# Patient Record
Sex: Female | Born: 1960
Health system: Southern US, Community
[De-identification: ages and names within clinical notes are randomized; demographics above are authoritative.]

## PROBLEM LIST (undated history)

## (undated) DIAGNOSIS — I712 Thoracic aortic aneurysm, without rupture: Secondary | ICD-10-CM

## (undated) DIAGNOSIS — F419 Anxiety disorder, unspecified: Secondary | ICD-10-CM

## (undated) DIAGNOSIS — R931 Abnormal findings on diagnostic imaging of heart and coronary circulation: Secondary | ICD-10-CM

## (undated) DIAGNOSIS — I779 Disorder of arteries and arterioles, unspecified: Secondary | ICD-10-CM

## (undated) DIAGNOSIS — S92309A Fracture of unspecified metatarsal bone(s), unspecified foot, initial encounter for closed fracture: Secondary | ICD-10-CM

## (undated) DIAGNOSIS — Z973 Presence of spectacles and contact lenses: Secondary | ICD-10-CM

## (undated) DIAGNOSIS — I773 Arterial fibromuscular dysplasia: Secondary | ICD-10-CM

## (undated) DIAGNOSIS — I7121 Aneurysm of the ascending aorta, without rupture: Secondary | ICD-10-CM

## (undated) DIAGNOSIS — N281 Cyst of kidney, acquired: Secondary | ICD-10-CM

## (undated) DIAGNOSIS — Q2381 Bicuspid aortic valve: Secondary | ICD-10-CM

## (undated) DIAGNOSIS — I10 Essential (primary) hypertension: Secondary | ICD-10-CM

## (undated) DIAGNOSIS — Q231 Congenital insufficiency of aortic valve: Secondary | ICD-10-CM

## (undated) DIAGNOSIS — N951 Menopausal and female climacteric states: Secondary | ICD-10-CM

## (undated) DIAGNOSIS — E119 Type 2 diabetes mellitus without complications: Secondary | ICD-10-CM

## (undated) DIAGNOSIS — I350 Nonrheumatic aortic (valve) stenosis: Secondary | ICD-10-CM

## (undated) DIAGNOSIS — Z8719 Personal history of other diseases of the digestive system: Secondary | ICD-10-CM

## (undated) DIAGNOSIS — E042 Nontoxic multinodular goiter: Secondary | ICD-10-CM

## (undated) DIAGNOSIS — I359 Nonrheumatic aortic valve disorder, unspecified: Secondary | ICD-10-CM

## (undated) DIAGNOSIS — R011 Cardiac murmur, unspecified: Secondary | ICD-10-CM

## (undated) DIAGNOSIS — M542 Cervicalgia: Secondary | ICD-10-CM

## (undated) DIAGNOSIS — I701 Atherosclerosis of renal artery: Secondary | ICD-10-CM

## (undated) DIAGNOSIS — R198 Other specified symptoms and signs involving the digestive system and abdomen: Secondary | ICD-10-CM

## (undated) DIAGNOSIS — Z1239 Encounter for other screening for malignant neoplasm of breast: Secondary | ICD-10-CM

## (undated) DIAGNOSIS — M1990 Unspecified osteoarthritis, unspecified site: Secondary | ICD-10-CM

## (undated) DIAGNOSIS — H3560 Retinal hemorrhage, unspecified eye: Secondary | ICD-10-CM

## (undated) HISTORY — DX: Fracture of unspecified metatarsal bone(s), unspecified foot, initial encounter for closed fracture: S92.309A

## (undated) HISTORY — DX: Other specified symptoms and signs involving the digestive system and abdomen: R19.8

## (undated) HISTORY — DX: Congenital insufficiency of aortic valve: Q23.1

## (undated) HISTORY — PX: OTHER SURGICAL HISTORY: SHX169

## (undated) HISTORY — DX: Disorder of arteries and arterioles, unspecified: I77.9

## (undated) HISTORY — DX: Nonrheumatic aortic (valve) stenosis: I35.0

## (undated) HISTORY — PX: APPENDECTOMY: SHX54

## (undated) HISTORY — PX: OSTEOTOMY MAXILLARY: SUR982

## (undated) HISTORY — DX: Essential (primary) hypertension: I10

## (undated) HISTORY — PX: OOPHORECTOMY: SHX86

## (undated) HISTORY — DX: Abnormal findings on diagnostic imaging of heart and coronary circulation: R93.1

## (undated) HISTORY — DX: Cardiac murmur, unspecified: R01.1

## (undated) HISTORY — PX: TUBAL LIGATION: SHX77

## (undated) HISTORY — DX: Nonrheumatic aortic valve disorder, unspecified: I35.9

## (undated) HISTORY — DX: Cervicalgia: M54.2

## (undated) HISTORY — DX: Encounter for other screening for malignant neoplasm of breast: Z12.39

## (undated) HISTORY — DX: Retinal hemorrhage, unspecified eye: H35.60

## (undated) HISTORY — DX: Cyst of kidney, acquired: N28.1

## (undated) HISTORY — DX: Bicuspid aortic valve: Q23.81

## (undated) HISTORY — DX: Menopausal and female climacteric states: N95.1

## (undated) HISTORY — PX: ABDOMINAL HYSTERECTOMY: SHX81

---

## 2001-08-24 LAB — HM COLONOSCOPY: HM Colonoscopy: NORMAL

## 2004-02-20 ENCOUNTER — Ambulatory Visit: Payer: Self-pay | Admitting: Physician Assistant

## 2004-06-27 ENCOUNTER — Ambulatory Visit: Payer: Self-pay | Admitting: Physician Assistant

## 2004-10-01 ENCOUNTER — Ambulatory Visit: Payer: Self-pay | Admitting: Physician Assistant

## 2005-01-31 ENCOUNTER — Ambulatory Visit: Payer: Self-pay | Admitting: Physician Assistant

## 2005-05-13 DIAGNOSIS — S92309A Fracture of unspecified metatarsal bone(s), unspecified foot, initial encounter for closed fracture: Secondary | ICD-10-CM

## 2005-05-13 HISTORY — DX: Fracture of unspecified metatarsal bone(s), unspecified foot, initial encounter for closed fracture: S92.309A

## 2005-05-30 ENCOUNTER — Ambulatory Visit: Payer: Self-pay | Admitting: Physician Assistant

## 2005-10-02 ENCOUNTER — Ambulatory Visit: Payer: Self-pay | Admitting: Physician Assistant

## 2006-01-30 ENCOUNTER — Ambulatory Visit: Payer: Self-pay | Admitting: Physician Assistant

## 2006-03-25 LAB — HM PAP SMEAR

## 2006-06-02 ENCOUNTER — Ambulatory Visit: Payer: Self-pay | Admitting: Physician Assistant

## 2006-10-09 ENCOUNTER — Ambulatory Visit: Payer: Self-pay | Admitting: Physician Assistant

## 2007-01-09 ENCOUNTER — Ambulatory Visit: Payer: Self-pay | Admitting: Physician Assistant

## 2007-03-06 ENCOUNTER — Ambulatory Visit: Payer: Self-pay | Admitting: Internal Medicine

## 2007-04-30 ENCOUNTER — Ambulatory Visit: Payer: Self-pay | Admitting: Physician Assistant

## 2007-08-12 ENCOUNTER — Ambulatory Visit: Payer: Self-pay | Admitting: Physician Assistant

## 2007-11-11 ENCOUNTER — Ambulatory Visit: Payer: Self-pay | Admitting: Physician Assistant

## 2007-12-01 ENCOUNTER — Ambulatory Visit: Payer: Self-pay

## 2008-03-10 ENCOUNTER — Ambulatory Visit: Payer: Self-pay | Admitting: Physician Assistant

## 2008-06-09 ENCOUNTER — Ambulatory Visit: Payer: Self-pay | Admitting: Physician Assistant

## 2008-09-20 ENCOUNTER — Ambulatory Visit: Payer: Self-pay | Admitting: Physician Assistant

## 2008-12-21 ENCOUNTER — Ambulatory Visit: Payer: Self-pay | Admitting: Physician Assistant

## 2009-04-13 ENCOUNTER — Ambulatory Visit: Payer: Self-pay | Admitting: Physician Assistant

## 2009-05-10 ENCOUNTER — Ambulatory Visit: Payer: Self-pay | Admitting: Physician Assistant

## 2009-06-08 ENCOUNTER — Ambulatory Visit: Payer: Self-pay | Admitting: Physician Assistant

## 2009-07-17 ENCOUNTER — Ambulatory Visit: Payer: Self-pay | Admitting: Pain Medicine

## 2009-08-14 ENCOUNTER — Ambulatory Visit: Payer: Self-pay | Admitting: Pain Medicine

## 2010-01-02 ENCOUNTER — Ambulatory Visit: Payer: Self-pay | Admitting: General Practice

## 2010-02-23 ENCOUNTER — Ambulatory Visit: Payer: Self-pay | Admitting: Internal Medicine

## 2010-09-25 ENCOUNTER — Ambulatory Visit: Payer: Self-pay | Admitting: Internal Medicine

## 2011-02-23 LAB — HM COLONOSCOPY: HM Colonoscopy: NORMAL

## 2011-02-28 ENCOUNTER — Telehealth: Payer: Self-pay | Admitting: Internal Medicine

## 2011-02-28 NOTE — Telephone Encounter (Signed)
Will fill med when fax is received.

## 2011-02-28 NOTE — Telephone Encounter (Signed)
Patient has gone by [employer] to sign a medical release form. Patient needs refill, there will be a fax request from the hospital pharmacy

## 2011-04-10 ENCOUNTER — Other Ambulatory Visit: Payer: Self-pay

## 2011-06-04 ENCOUNTER — Other Ambulatory Visit: Payer: Self-pay | Admitting: *Deleted

## 2011-06-04 NOTE — Telephone Encounter (Signed)
Faxed request from [facility], last filled 05/03/11.

## 2011-06-05 NOTE — Telephone Encounter (Signed)
LMOM for patient to call 

## 2011-06-05 NOTE — Telephone Encounter (Signed)
Pleas call Courtney Lopez   She has chronic back pain but has not been seen in new office yet and #240 tablets is a 2 month supply at 4 daily  So if she refilled in on 12/21 she is early .  Cannot take more than 4 daily bc of the tylenol content.

## 2011-06-06 NOTE — Telephone Encounter (Signed)
Advised pt, she says the current bottle she has says to take 2 tablets four times a day.  She says she doesn't take that many every day and never takes above that amount.  She says she has enough to last until mid next week.

## 2011-06-07 MED ORDER — HYDROCODONE-ACETAMINOPHEN 5-500 MG PO TABS: 1.0000 | ORAL_TABLET | Freq: Four times a day (QID) | ORAL | Status: AC | PRN

## 2011-06-07 NOTE — Telephone Encounter (Signed)
i cannot refilkl it like that  bc the maxium she should have is 4 daily because of the tylenol.  I will refill for 120/month

## 2011-06-07 NOTE — Telephone Encounter (Signed)
Medicine called to pharmacy. 

## 2011-06-13 ENCOUNTER — Telehealth: Payer: Self-pay | Admitting: Internal Medicine

## 2011-06-13 NOTE — Telephone Encounter (Signed)
Saw her in the hospital Saturday night, she did not say she needed anything else for thr time being,

## 2011-06-13 NOTE — Telephone Encounter (Signed)
Offered pt appt today because there is a cancellation but pt says she is out of town and unable to come in.  She has appt on 2/5, says she will come in then.

## 2011-06-13 NOTE — Telephone Encounter (Signed)
Left message on machine asking pt to call back. 

## 2011-06-13 NOTE — Telephone Encounter (Signed)
Patient is having neck issues needing a prescription for it wants to be seen Friday or Monday.

## 2011-06-18 ENCOUNTER — Ambulatory Visit (INDEPENDENT_AMBULATORY_CARE_PROVIDER_SITE_OTHER): Payer: PRIVATE HEALTH INSURANCE | Admitting: Internal Medicine

## 2011-06-18 ENCOUNTER — Encounter: Payer: Self-pay | Admitting: Internal Medicine

## 2011-06-18 VITALS — BP 180/108 | HR 80 | Temp 99.0°F | Wt 188.0 lb

## 2011-06-18 DIAGNOSIS — I10 Essential (primary) hypertension: Secondary | ICD-10-CM

## 2011-06-18 DIAGNOSIS — M542 Cervicalgia: Secondary | ICD-10-CM

## 2011-06-18 DIAGNOSIS — M489 Spondylopathy, unspecified: Secondary | ICD-10-CM | POA: Insufficient documentation

## 2011-06-18 MED ORDER — HYDROCODONE-ACETAMINOPHEN 10-500 MG PO TABS: 1.0000 | ORAL_TABLET | Freq: Four times a day (QID) | ORAL | Status: AC | PRN

## 2011-06-18 MED ORDER — TRAMADOL HCL 50 MG PO TABS: 100.0000 mg | ORAL_TABLET | Freq: Three times a day (TID) | ORAL | Status: DC | PRN

## 2011-06-18 NOTE — Patient Instructions (Signed)
Take extra dose of labetalol as needed for bp >  150/90.    Call if you want an rx for amlodipine instead   Changing Norco to Lortab 10 /500  Up to 4 daily as needed,    Trial of tramadol 50 to 100 mg up to 8 tablets daily for working days

## 2011-06-18 NOTE — Assessment & Plan Note (Signed)
Chronic history of cervical stenosis and no MRI in over 3 years. She is having persistent pain radiating to her right fifth finger accompanied by numbness and tingling. She has not considered surgery in the past but is willing to consider options now if there is signs are if there are signs of stenosis or cord impingement. 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 MRI has been ordered.

## 2011-06-18 NOTE — Assessment & Plan Note (Signed)
Her blood pressure has been uncontrolled due to uncontrolled pain. She does measure her blood pressure frequently and notes it is normally 140/90. Have recommended that she use additional labetalol doses when systolic is greater than 150. No changes today

## 2011-06-18 NOTE — Progress Notes (Signed)
Subjective:    Patient ID: Courtney Lopez, female    DOB: 1960-06-13, 51 y.o.   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 She was previously taking this into but due to prescription problems and availability she was changed to Norco. She has not been seen in over 6 months and her Norco prescription ran out about a week ago.  She has had to stay home from work due to uncontrolled pain and hypertension. She  is havng having episodes of neuroapthy on right ar m to 5th finger,  Last MRI 40981 or earlier   Previously treated pain Clinic treated with steroid injections. Past Medical History  Diagnosis Date  . Metatarsal fracture 2007    5th, from wearing high heels  . Unspecified essential hypertension   . Other symptoms involving abdomen and pelvis   . Cervicalgia   . Breast screening, unspecified   . Retinal hemorrhage   . Symptomatic menopausal or female climacteric states   . Aortic valve disorders    No current outpatient prescriptions on file prior to visit.   Review of Systems  Constitutional: Negative for fever, chills and unexpected weight change.  HENT: Positive for neck pain. Negative for hearing loss, ear pain, nosebleeds, congestion, sore throat, facial swelling, rhinorrhea, sneezing, mouth sores, trouble swallowing, neck stiffness, voice change, postnasal drip, sinus pressure, tinnitus and ear discharge.   Eyes: Negative for pain, discharge, redness and visual disturbance.  Respiratory: Negative for cough, chest tightness, shortness of breath, wheezing and stridor.   Cardiovascular: Negative for chest pain, palpitations and leg swelling.  Musculoskeletal: Negative for myalgias and arthralgias.  Skin: Negative for color change and rash.  Neurological: Positive for headaches.  Negative for dizziness, weakness and light-headedness.  Hematological: Negative for adenopathy.      Objective:   Physical Exam  Constitutional: She is oriented to person, place, and time. She appears well-developed and well-nourished.  HENT:  Mouth/Throat: Oropharynx is clear and moist.  Eyes: EOM are normal. Pupils are equal, round, and reactive to light. No scleral icterus.  Neck: Normal range of motion. Neck supple. No JVD present. No thyromegaly present.  Cardiovascular: Normal rate, regular rhythm, normal heart sounds and intact distal pulses.   Pulmonary/Chest: Effort normal and breath sounds normal.  Abdominal: Soft. Bowel sounds are normal. She exhibits no mass. There is no tenderness.  Musculoskeletal: Normal range of motion. She exhibits no edema.  Lymphadenopathy:    She has no cervical adenopathy.  Neurological: She is alert and oriented to person, place, and time.  Skin: Skin is warm and dry.  Psychiatric: She has a normal mood and affect.          Assessment & Plan:   Cervicalgia Chronic history of cervical stenosis and no MRI in over 3 years. She is having persistent pain radiating to her right fifth finger accompanied by numbness and tingling. She has not considered surgery in the past but is willing to consider options now if there is signs are if there are signs of stenosis or cord impingement. 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 MRI has been ordered.  Hypertension Her blood pressure  has been uncontrolled due to uncontrolled pain. She does measure her blood pressure frequently and notes it is normally 140/90. Have recommended that she use additional labetalol doses when systolic is greater than 150. No changes today    Updated Medication List Outpatient Encounter Prescriptions as of 06/18/2011  Medication Sig Dispense Refill  . ALPRAZolam  (XANAX) 0.25 MG tablet Take 0.25 mg by mouth at bedtime as needed.      [name] aspirin 81 MG tablet Take 81 mg by mouth daily.      . cyclobenzaprine (FLEXERIL) 5 MG tablet Take 5 mg by mouth 3 (three) times daily as needed.      [name] estradiol (ESTRACE) 0.1 MG/GM vaginal cream Apply vaginally as directed.      . gabapentin (NEURONTIN) 100 MG capsule Take one or two every 12 hours as needed for neuropathy.      . labetalol (NORMODYNE) 200 MG tablet Take 200 mg by mouth 2 (two) times daily.      . valsartan-hydrochlorothiazide (DIOVAN-HCT) 80-12.5 MG per tablet Take one by mouth 2 times daily      . DISCONTD: HYDROcodone-acetaminophen (NORCO) 5-325 MG per tablet Take 2 tablets 4 times a day as needed for pain.      [name] HYDROcodone-acetaminophen (LORTAB 10) 10-500 MG per tablet Take 1 tablet by mouth every 6 (six) hours as needed for pain.  120 tablet  0  . traMADol (ULTRAM) 50 MG tablet Take 2 tablets (100 mg total) by mouth every 8 (eight) hours as needed for pain.  180 tablet  5  . DISCONTD: tapentadol (NUCYNTA) 50 MG TABS Take one by mouth every 6 hours as needed

## 2011-07-04 ENCOUNTER — Ambulatory Visit: Payer: Self-pay | Admitting: Internal Medicine

## 2011-07-08 ENCOUNTER — Telehealth: Payer: Self-pay | Admitting: Internal Medicine

## 2011-07-08 DIAGNOSIS — M542 Cervicalgia: Secondary | ICD-10-CM

## 2011-07-08 NOTE — Telephone Encounter (Signed)
Patient notified. She does agree to referral and does not have a preference.

## 2011-07-08 NOTE — Telephone Encounter (Signed)
Her cervical spine MRI showed stenosis at C5-C6 on both sides, which is the cause of her pain .  I am recommending nuerosurgical referral .  Does she agree and does she have a preference.

## 2011-07-08 NOTE — Telephone Encounter (Signed)
Left message asking patient to return my call.

## 2011-07-08 NOTE — Telephone Encounter (Signed)
Will refer to [facility] in [place]. Carollee Herter will call with appt

## 2011-07-09 NOTE — Telephone Encounter (Signed)
Patient advised that Carollee Herter will call her to set appt.

## 2011-07-22 ENCOUNTER — Other Ambulatory Visit: Payer: Self-pay | Admitting: Internal Medicine

## 2011-07-22 MED ORDER — HYDROCODONE-ACETAMINOPHEN 10-325 MG PO TABS: 1.0000 | ORAL_TABLET | Freq: Four times a day (QID) | ORAL | Status: AC | PRN

## 2011-08-21 ENCOUNTER — Encounter: Payer: Self-pay | Admitting: Internal Medicine

## 2011-08-21 ENCOUNTER — Ambulatory Visit (INDEPENDENT_AMBULATORY_CARE_PROVIDER_SITE_OTHER): Payer: PRIVATE HEALTH INSURANCE | Admitting: Internal Medicine

## 2011-08-21 VITALS — BP 108/70 | HR 82 | Temp 98.7°F | Resp 16 | Ht 66.0 in | Wt 189.5 lb

## 2011-08-21 DIAGNOSIS — E785 Hyperlipidemia, unspecified: Secondary | ICD-10-CM

## 2011-08-21 DIAGNOSIS — M542 Cervicalgia: Secondary | ICD-10-CM

## 2011-08-21 DIAGNOSIS — Z1211 Encounter for screening for malignant neoplasm of colon: Secondary | ICD-10-CM

## 2011-08-21 DIAGNOSIS — E669 Obesity, unspecified: Secondary | ICD-10-CM

## 2011-08-21 DIAGNOSIS — I10 Essential (primary) hypertension: Secondary | ICD-10-CM

## 2011-08-21 DIAGNOSIS — Z1239 Encounter for other screening for malignant neoplasm of breast: Secondary | ICD-10-CM

## 2011-08-21 MED ORDER — HYDROCHLOROTHIAZIDE 25 MG PO TABS: 25.0000 mg | ORAL_TABLET | Freq: Every day | ORAL | Status: DC

## 2011-08-21 MED ORDER — ALPRAZOLAM 0.25 MG PO TABS: 0.2500 mg | ORAL_TABLET | Freq: Two times a day (BID) | ORAL | Status: DC | PRN

## 2011-08-21 MED ORDER — ESTRADIOL 0.1 MG/GM VA CREA: 2.0000 g | TOPICAL_CREAM | VAGINAL | Status: DC

## 2011-08-21 MED ORDER — VALSARTAN 80 MG PO TABS: 80.0000 mg | ORAL_TABLET | Freq: Every day | ORAL | Status: DC

## 2011-08-21 MED ORDER — GABAPENTIN 100 MG PO CAPS: 100.0000 mg | ORAL_CAPSULE | Freq: Three times a day (TID) | ORAL | Status: DC

## 2011-08-21 MED ORDER — LABETALOL HCL 200 MG PO TABS: 200.0000 mg | ORAL_TABLET | Freq: Two times a day (BID) | ORAL | Status: DC

## 2011-08-21 MED ORDER — TRAMADOL HCL 50 MG PO TABS: 100.0000 mg | ORAL_TABLET | Freq: Three times a day (TID) | ORAL | Status: DC | PRN

## 2011-08-21 MED ORDER — CYCLOBENZAPRINE HCL 5 MG PO TABS: 5.0000 mg | ORAL_TABLET | Freq: Three times a day (TID) | ORAL | Status: DC | PRN

## 2011-08-25 ENCOUNTER — Encounter: Payer: Self-pay | Admitting: Internal Medicine

## 2011-08-25 DIAGNOSIS — E785 Hyperlipidemia, unspecified: Secondary | ICD-10-CM | POA: Insufficient documentation

## 2011-08-25 DIAGNOSIS — Z1239 Encounter for other screening for malignant neoplasm of breast: Secondary | ICD-10-CM | POA: Insufficient documentation

## 2011-08-25 DIAGNOSIS — E1169 Type 2 diabetes mellitus with other specified complication: Secondary | ICD-10-CM | POA: Insufficient documentation

## 2011-08-25 DIAGNOSIS — E663 Overweight: Secondary | ICD-10-CM | POA: Insufficient documentation

## 2011-08-25 DIAGNOSIS — Z1211 Encounter for screening for malignant neoplasm of colon: Secondary | ICD-10-CM | POA: Insufficient documentation

## 2011-08-25 NOTE — Assessment & Plan Note (Signed)
With fasting glucose recently 115.  I have addressed  BMI and recommended a low glycemic index diet utilizing smaller more frequent meals to increase metabolism.  I have also recommended that patient start exercising with a goal of 30 minutes of aerobic exercise a minimum of 5 days per week. Screening for lipid disorders, thyroid and diabetes  Was done in late November at [facility].  LDL was 115, trigs 198, HDL 55

## 2011-08-25 NOTE — Assessment & Plan Note (Signed)
Secondary to cervical spondylosis and DDD with C5-C6 disease with severe formainal stenosis on the left,  Has been referred to Neurosurgery.  Continue current medications.  No strength or DTR deficits on exam.

## 2011-08-25 NOTE — Assessment & Plan Note (Signed)
Mammogram ordered.  She is overdue, does her own breast exams.

## 2011-08-25 NOTE — Assessment & Plan Note (Signed)
Was done in late November at [facility].  LDL was 115, trigs 198, HDL 55

## 2011-08-25 NOTE — Assessment & Plan Note (Signed)
She was Dr. Charlett Nose in September with plans for colonoscopy, diagnostic, for recurrent episodes of abdominal pain presumed to be partial SBO vs diverticulitis.  Celiac panel was negative.  Colonoscopy has been delyared by her work schedule

## 2011-08-25 NOTE — Progress Notes (Signed)
Patient ID: Courtney Lopez, female   DOB: January 20, 1961, 51 y.o.   MRN: [id number]  Patient Active Problem List  Diagnoses  . Cervicalgia  . Hypertension  . Screening for colon cancer  . Screening for breast cancer    Subjective:  CC:   Chief Complaint  Patient presents with  . Annual Exam    HPI:   Courtney Lopez a 51 y.o. female who presents for her annual exam (non gyn) and to follow up on chronic and acute  medical conditions including hypertension, neck and back pain.  She has no new issues except mild pain in her left arm that appears to be brought on by overuse  .  Has been using biofreeze with good results.  Tolerating blood pressure medications,  sleeping well,,  Continues to  Oral  HRT for menospusal symptoms,  Not able to wean yet as  past attempts to taper dose   Past Medical History  Diagnosis Date  . Metatarsal fracture 2007    5th, from wearing high heels  . Unspecified essential hypertension   . Other symptoms involving abdomen and pelvis   . Cervicalgia   . Breast screening, unspecified   . Retinal hemorrhage   . Symptomatic menopausal or female climacteric states   . Aortic valve disorders     Past Surgical History  Procedure Date  . Gyn surgery     hysterectomy- done for adenomysosis and endometriosis  . Oophorectomy     unilateral, due to ruptured ovarian cysts  . Osteotomy maxillary     due to bite problems  . Appendectomy     done during oophorectomy  . Cesarean section     x 2  . Abdominal hysterectomy          The following portions of the patient's history were reviewed and updated as appropriate: Allergies, current medications, and problem list.    Review of Systems:   12 Pt  review of systems was negative except those addressed in the HPI,     History   Social History  . Marital Status: Married    Spouse Name: N/A    Number of Children: 3  . Years of Education: N/A   Occupational History  . Not on file.    Social History Main Topics  . Smoking status: Never Smoker   . Smokeless tobacco: Never Used  . Alcohol Use: No  . Drug Use: No  . Sexually Active: Not on file   Other Topics Concern  . Not on file   Social History Narrative   Lives with spouse.    Objective:  BP 108/70  Pulse 82  Temp(Src) 98.7 F (37.1 C) (Oral)  Resp 16  Ht 5\' 6"  (1.676 m)  Wt 189 lb 8 oz (85.957 kg)  BMI 30.59 kg/m2  SpO2 99%  General appearance: alert, cooperative and appears stated age Ears: normal TM's and external ear canals both ears Throat: lips, mucosa, and tongue normal; teeth and gums normal Neck: no adenopathy, no carotid bruit, supple, symmetrical, trachea midline and thyroid not enlarged, symmetric, no tenderness/mass/nodules Back: symmetric, no curvature. ROM normal. No CVA tenderness. Lungs: clear to auscultation bilaterally Heart: regular rate and rhythm, S1, S2 normal, no murmur, click, rub or gallop Abdomen: soft, non-tender; bowel sounds normal; no masses,  no organomegaly Pulses: 2+ and symmetric Skin: Skin color, texture, turgor normal. No rashes or lesions Lymph nodes: Cervical, supraclavicular, and axillary nodes normal.  Assessment and Plan:  Hypertension Well controlled on  current regimen.  No changes today.  Cervicalgia Secondary to cervical spondylosis and DDD with C5-C6 disease with severe formainal stenosis on the left,  Has been referred to Neurosurgery.  Continue current medications.  No strength or DTR deficits on exam.  Screening for colon cancer She was Dr. Charlett Nose in September with plans for colonoscopy, diagnostic, for recurrent episodes of abdominal pain presumed to be partial SBO vs diverticulitis.  Celiac panel was negative.  Colonoscopy has been delyared by her work schedule   Screening for breast cancer Mammogram ordered.  She is overdue, does her own breast exams.     Updated Medication List Outpatient Encounter Prescriptions as of 08/21/2011   Medication Sig Dispense Refill  . ALPRAZolam (XANAX) 0.25 MG tablet Take 1 tablet (0.25 mg total) by mouth 2 (two) times daily as needed.  180 tablet  3  . aspirin 81 MG tablet Take 81 mg by mouth daily.      . cyclobenzaprine (FLEXERIL) 5 MG tablet Take 1 tablet (5 mg total) by mouth 3 (three) times daily as needed.  90 tablet  3  . estradiol (ESTRACE) 0.1 MG/GM vaginal cream Place 0.25 Applicatorfuls vaginally 2 (two) times a week. Apply vaginally as directed.  42.5 g  11  . gabapentin (NEURONTIN) 100 MG capsule Take 1 capsule (100 mg total) by mouth 3 (three) times daily. Take one or two every 12 hours as needed for neuropathy.  90 capsule  3  . HYDROcodone-acetaminophen (LORTAB) 10-500 MG per tablet Take 1 tablet by mouth every 6 (six) hours as needed.      . labetalol (NORMODYNE) 200 MG tablet Take 1 tablet (200 mg total) by mouth 2 (two) times daily.  180 tablet  3  . Menthol, Topical Analgesic, (BIOFREEZE EX) Apply topically.      . traMADol (ULTRAM) 50 MG tablet Take 2 tablets (100 mg total) by mouth every 8 (eight) hours as needed for pain.  180 tablet  5  . DISCONTD: ALPRAZolam (XANAX) 0.25 MG tablet Take 0.25 mg by mouth at bedtime as needed.      [name] DISCONTD: cyclobenzaprine (FLEXERIL) 5 MG tablet Take 5 mg by mouth 3 (three) times daily as needed.      [name] DISCONTD: estradiol (ESTRACE) 0.1 MG/GM vaginal cream Apply vaginally as directed.      [name] DISCONTD: gabapentin (NEURONTIN) 100 MG capsule Take one or two every 12 hours as needed for neuropathy.      [name] DISCONTD: labetalol (NORMODYNE) 200 MG tablet Take 200 mg by mouth 2 (two) times daily.      [name] DISCONTD: traMADol (ULTRAM) 50 MG tablet Take 2 tablets (100 mg total) by mouth every 8 (eight) hours as needed for pain.  180 tablet  5  . DISCONTD: valsartan-hydrochlorothiazide (DIOVAN-HCT) 80-12.5 MG per tablet Take one by mouth 2 times daily      . hydrochlorothiazide (HYDRODIURIL) 25 MG tablet Take 1 tablet (25 mg total) by mouth daily.   90 tablet  3  . valsartan (DIOVAN) 80 MG tablet Take 1 tablet (80 mg total) by mouth daily.  90 tablet  3     Orders Placed This Encounter  Procedures  . MyChart Weight Flowsheet  . HM COLONOSCOPY    No Follow-up on file.      ,mnote

## 2011-08-25 NOTE — Assessment & Plan Note (Signed)
Well-controlled on current regimen. No changes today. 

## 2011-10-28 ENCOUNTER — Other Ambulatory Visit: Payer: Self-pay | Admitting: Internal Medicine

## 2011-10-28 MED ORDER — HYDROCODONE-ACETAMINOPHEN 10-500 MG PO TABS: 1.0000 | ORAL_TABLET | Freq: Four times a day (QID) | ORAL | Status: DC | PRN

## 2012-01-07 ENCOUNTER — Telehealth: Payer: Self-pay | Admitting: Internal Medicine

## 2012-01-07 MED ORDER — VALSARTAN 160 MG PO TABS: 160.0000 mg | ORAL_TABLET | Freq: Every day | ORAL | Status: DC

## 2012-01-07 NOTE — Telephone Encounter (Signed)
Patient called and stated at her last visit you wanted her to only take Diovan once a day, but she forgot and has been taking it twice daily since then and he BP has been running 128/82.  She stated she has been out completely the past few days and her BP is 158/94 pulse 84.  She wanted to know if you were ok with her staying on the Diovan twice daily or if you have another suggestion for another medication.  She stated she is still taking the HCTZ daily.  Please advise.

## 2012-01-07 NOTE — Telephone Encounter (Signed)
Seh can contineu taking the diovan but we should simply her regiemn and have her take 160 mg once daily,.  It is a once daily medication.  I will call to pharmacy

## 2012-01-08 NOTE — Telephone Encounter (Signed)
Left detailed message notifying patient on her cell phone.  I asked that she call me back so I know she received the message.

## 2012-01-09 ENCOUNTER — Other Ambulatory Visit: Payer: Self-pay | Admitting: Internal Medicine

## 2012-01-10 MED ORDER — HYDROCODONE-ACETAMINOPHEN 10-500 MG PO TABS: 1.0000 | ORAL_TABLET | Freq: Four times a day (QID) | ORAL | Status: DC | PRN

## 2012-01-10 NOTE — Telephone Encounter (Signed)
rx faxed to pharmacy

## 2012-01-30 ENCOUNTER — Encounter: Payer: Self-pay | Admitting: Internal Medicine

## 2012-01-30 ENCOUNTER — Ambulatory Visit (INDEPENDENT_AMBULATORY_CARE_PROVIDER_SITE_OTHER): Payer: PRIVATE HEALTH INSURANCE | Admitting: Internal Medicine

## 2012-01-30 VITALS — BP 150/92 | HR 76 | Temp 99.1°F | Resp 18 | Wt 192.5 lb

## 2012-01-30 DIAGNOSIS — R079 Chest pain, unspecified: Secondary | ICD-10-CM

## 2012-01-30 DIAGNOSIS — I10 Essential (primary) hypertension: Secondary | ICD-10-CM

## 2012-01-30 MED ORDER — AMLODIPINE BESYLATE 2.5 MG PO TABS: 2.5000 mg | ORAL_TABLET | Freq: Every day | ORAL | Status: DC

## 2012-01-30 MED ORDER — METOPROLOL SUCCINATE ER 100 MG PO TB24: 100.0000 mg | ORAL_TABLET | Freq: Every day | ORAL | Status: DC

## 2012-01-30 NOTE — Patient Instructions (Addendum)
Your EKG was normal. I'm recommending that you  stop the labetalol and start Toprol-XL 100 mg daily at bedtime.    If after one week your blood pressure is not below 140/90. We will start low-dose amlodipine at 2.5 mg daily.  I am referring you to [facility] for renal artery ultrasound.

## 2012-01-30 NOTE — Progress Notes (Signed)
Patient ID: Courtney Lopez, female   DOB: [birth year], 51 y.o.   MRN: [id number]  Patient Active Problem List  Diagnosis  . Cervicalgia  . Hypertension  . Screening for colon cancer  . Screening for breast cancer  . Obesity (BMI 30-39.9)  . Hyperlipidemia LDL goal < 100    Subjective:  CC:   Chief Complaint  Patient presents with  . Hypertension    HPI:   Courtney Lopez a 51 y.o. female who presents  With Elevated blood pressure.. she has aistory of hypertension which has been managed effectively with HCTZ, labetalol 200 mg twice daily and Diovan 160 mg daily until recently. Her readings have been high for the past 2-3 with weeks accompanied by rapid pulse and changes in vision..  She also has a feeling in her chest that she describes not as chest pressure but as palpitations.  She has a history of chronic neck and back pain and has been using tramadol to reduce her  use of vicodin.  Yesterday bp was 188/118.  Spent the day releaxing, had a good night of rest.  Repeat blood pressure this morning before her medications was 200/112.  Last tramadol dose was 5 days ago.  She denies any current or previous use of appetite suppressants, herbal supplements or decongestants.. The only recent change has been in her work Schedule change from nights  To days.  She took a week off in between to adjust her sleep cycle.  The first week of new schedule she had a visual disturbance without a headache.  Saw the ophthalmologist, who said her retina exam had changes consistent with hypertension. 2nd epsiode of right eye visual blurring,  accompanied by conjunctival hemorrhage. Prior antihypertensives that she did not tolerate due to edema: procardia , but was not on hctz at the time.  Last EKG [place] 2 years ago.    Past Medical History  Diagnosis Date  . Metatarsal fracture 2007    5th, from wearing high heels  . Unspecified essential hypertension   . Other symptoms involving abdomen and pelvis     . Cervicalgia   . Breast screening, unspecified   . Retinal hemorrhage   . Symptomatic menopausal or female climacteric states   . Aortic valve disorders     Past Surgical History  Procedure Date  . Gyn surgery     hysterectomy- done for adenomysosis and endometriosis  . Oophorectomy     unilateral, due to ruptured ovarian cysts  . Osteotomy maxillary     due to bite problems  . Appendectomy     done during oophorectomy  . Cesarean section     x 2  . Abdominal hysterectomy          The following portions of the patient's history were reviewed and updated as appropriate: Allergies, current medications, and problem list.    Review of Systems:   12 Pt  review of systems was negative except those addressed in the HPI,     History   Social History  . Marital Status: Married    Spouse Name: N/A    Number of Children: 3  . Years of Education: N/A   Occupational History  . Not on file.   Social History Main Topics  . Smoking status: Never Smoker   . Smokeless tobacco: Never Used  . Alcohol Use: No  . Drug Use: No  . Sexually Active: Not on file   Other Topics Concern  . Not on file  Social History Narrative   Lives with spouse.    Objective:  BP 150/92  Pulse 76  Temp 99.1 F (37.3 C) (Oral)  Resp 18  Wt 192 lb 8 oz (87.317 kg)  SpO2 97%  General appearance: alert, cooperative and appears stated age Ears: normal TM's and external ear canals both ears Throat: lips, mucosa, and tongue normal; teeth and gums normal Neck: no adenopathy, no carotid bruit, supple, symmetrical, trachea midline and thyroid not enlarged, symmetric, no tenderness/mass/nodules Back: symmetric, no curvature. ROM normal. No CVA tenderness. Lungs: clear to auscultation bilaterally Heart: regular rate and rhythm, S1, S2 normal, no murmur, click, rub or gallop Abdomen: soft, non-tender; bowel sounds normal; no masses,  no organomegaly Pulses: 2+ and symmetric Skin: Skin  color, texture, turgor normal. No rashes or lesions Lymph nodes: Cervical, supraclavicular, and axillary nodes normal.  Assessment and Plan:  Hypertension With recent loss of control which has been persistent for several weeks. We discussed looking for causes of secondary hypertension. Her EKG showed no LVH today. I am referring her for renal vascular ultrasound at [facility]. If this is normal I have recommended that she undergo sleep study to look for sleep apnea. Regarding her elevated blood pressure, I am changing her labetalol to Toprol-XL 100 mg to take it at bedtime. I've also given her prescription for amlodipine 2.5 mg. I've instructed her to add  this in one week if her blood pressure is not controlled with the Toprol substitution.   Updated Medication List Outpatient Encounter Prescriptions as of 01/30/2012  Medication Sig Dispense Refill  . ALPRAZolam (XANAX) 0.25 MG tablet Take 1 tablet (0.25 mg total) by mouth 2 (two) times daily as needed.  180 tablet  3  . aspirin 81 MG tablet Take 81 mg by mouth daily.      . cyclobenzaprine (FLEXERIL) 5 MG tablet Take 1 tablet (5 mg total) by mouth 3 (three) times daily as needed.  90 tablet  3  . estradiol (ESTRACE) 0.1 MG/GM vaginal cream Place 0.25 Applicatorfuls vaginally 2 (two) times a week. Apply vaginally as directed.  42.5 g  11  . hydrochlorothiazide (HYDRODIURIL) 25 MG tablet Take 1 tablet (25 mg total) by mouth daily.  90 tablet  3  . HYDROcodone-acetaminophen (LORTAB) 10-500 MG per tablet Take 1 tablet by mouth every 6 (six) hours as needed.  120 tablet  1  . Menthol, Topical Analgesic, (BIOFREEZE EX) Apply topically.      . traMADol (ULTRAM) 50 MG tablet Take 2 tablets (100 mg total) by mouth every 8 (eight) hours as needed for pain.  180 tablet  5  . valsartan (DIOVAN) 160 MG tablet Take 1 tablet (160 mg total) by mouth daily.  90 tablet  3  . DISCONTD: labetalol (NORMODYNE) 200 MG tablet Take 1 tablet (200 mg  total) by mouth 2 (two) times daily.  180 tablet  3  . amLODipine (NORVASC) 2.5 MG tablet Take 1 tablet (2.5 mg total) by mouth daily.  90 tablet  3  . metoprolol succinate (TOPROL-XL) 100 MG 24 hr tablet Take 1 tablet (100 mg total) by mouth daily. Take with or immediately following a meal.  90 tablet  3  . DISCONTD: gabapentin (NEURONTIN) 100 MG capsule Take 1 capsule (100 mg total) by mouth 3 (three) times daily. Take one or two every 12 hours as needed for neuropathy.  90 capsule  3     Orders Placed This Encounter  Procedures  .  US Renal Artery Stenosis  . EKG 12-Lead    No Follow-up on file.

## 2012-01-31 ENCOUNTER — Encounter: Payer: Self-pay | Admitting: Internal Medicine

## 2012-01-31 NOTE — Assessment & Plan Note (Signed)
With recent loss of control which has been persistent for several weeks. We discussed looking for causes of secondary hypertension. Her EKG showed no LVH today. I am referring her for renal vascular ultrasound at [facility]. If this is normal I have recommended that she undergo sleep study to look for sleep apnea. Regarding her elevated blood pressure, I am changing her labetalol to Toprol-XL 100 mg to take it at bedtime. I've also given her prescription for amlodipine 2.5 mg. I've instructed her to add  this in one week if her blood pressure is not controlled with the Toprol substitution.

## 2012-02-20 ENCOUNTER — Telehealth: Payer: Self-pay | Admitting: Internal Medicine

## 2012-02-20 NOTE — Telephone Encounter (Signed)
Pt's blood pressure is extremely high it is running 190/120. She was wanting to come in to see Dr. Darrick Huntsman and didn't want to be sent over to call-a-nurse.

## 2012-02-21 ENCOUNTER — Telehealth: Payer: Self-pay | Admitting: Internal Medicine

## 2012-02-21 NOTE — Telephone Encounter (Signed)
Refill request for controlled substance Alprazolam 0.25 mg tablet 180 tabs Qty Sig: take one tablet by mouth 2 times a day as needed.

## 2012-02-24 ENCOUNTER — Ambulatory Visit (INDEPENDENT_AMBULATORY_CARE_PROVIDER_SITE_OTHER): Payer: PRIVATE HEALTH INSURANCE | Admitting: Internal Medicine

## 2012-02-24 ENCOUNTER — Encounter: Payer: Self-pay | Admitting: Internal Medicine

## 2012-02-24 ENCOUNTER — Ambulatory Visit: Payer: Self-pay | Admitting: Internal Medicine

## 2012-02-24 ENCOUNTER — Other Ambulatory Visit: Payer: Self-pay

## 2012-02-24 VITALS — BP 200/102 | HR 103 | Temp 98.5°F | Ht 65.5 in | Wt 190.8 lb

## 2012-02-24 DIAGNOSIS — M79609 Pain in unspecified limb: Secondary | ICD-10-CM

## 2012-02-24 DIAGNOSIS — I10 Essential (primary) hypertension: Secondary | ICD-10-CM

## 2012-02-24 DIAGNOSIS — M79669 Pain in unspecified lower leg: Secondary | ICD-10-CM

## 2012-02-24 MED ORDER — ALPRAZOLAM 0.25 MG PO TABS: 0.2500 mg | ORAL_TABLET | Freq: Two times a day (BID) | ORAL | Status: DC | PRN

## 2012-02-24 MED ORDER — AMLODIPINE BESYLATE 5 MG PO TABS: ORAL_TABLET | ORAL | Status: DC

## 2012-02-24 NOTE — Telephone Encounter (Signed)
Alprazolam refill authorized. Please phone order to [facility].

## 2012-02-24 NOTE — Progress Notes (Addendum)
Patient ID: Courtney Lopez, female   DOB: 28-Nov-1960, 51 y.o.   MRN: [id number]  Patient Active Problem List  Diagnosis  . Cervicalgia  . Hypertension  . Screening for colon cancer  . Screening for breast cancer  . Obesity (BMI 30-39.9)  . Hyperlipidemia LDL goal < 100    Subjective:  CC:   Chief Complaint  Patient presents with  . Hypertension    HPI:   Reshonda Lopez a 51 y.o. female who presents Elevated blood pressures  and irregular heart rhythms, which started after her last medication adjustment.  She states that she felt better on labetalolol.,  doesn't like the toprol,  Started taking the amlodipine for bps in the 180 to 190   Developed right calf tenderness on Saturday after lying around the house for two days bc of  Symptomatic hypertension .     Past Medical History  Diagnosis Date  . Metatarsal fracture 2007    5th, from wearing high heels  . Unspecified essential hypertension   . Other symptoms involving abdomen and pelvis   . Cervicalgia   . Breast screening, unspecified   . Retinal hemorrhage   . Symptomatic menopausal or female climacteric states   . Aortic valve disorders     Past Surgical History  Procedure Date  . Gyn surgery     hysterectomy- done for adenomysosis and endometriosis  . Oophorectomy     unilateral, due to ruptured ovarian cysts  . Osteotomy maxillary     due to bite problems  . Appendectomy     done during oophorectomy  . Cesarean section     x 2  . Abdominal hysterectomy     The following portions of the patient's history were reviewed and updated as appropriate: Allergies, current medications, and problem list.   Review of Systems:   12 Pt  review of systems was negative except those addressed in the HPI,   History   Social History  . Marital Status: Married    Spouse Name: N/A    Number of Children: 3  . Years of Education: N/A   Occupational History  . Not on file.   Social History Main Topics  .  Smoking status: Never Smoker   . Smokeless tobacco: Never Used  . Alcohol Use: No  . Drug Use: No  . Sexually Active: Not on file   Other Topics Concern  . Not on file   Social History Narrative   Lives with spouse.    Objective:  BP 200/102  Pulse 103  Temp 98.5 F (36.9 C) (Oral)  Ht 5' 5.5" (1.664 m)  Wt 190 lb 12 oz (86.524 kg)  BMI 31.26 kg/m2  SpO2 97%  General appearance: alert, cooperative and appears stated age Neck: no adenopathy, no carotid bruit, supple, symmetrical, trachea midline and thyroid not enlarged, symmetric, no tenderness/mass/nodules Back: symmetric, no curvature. ROM normal. No CVA tenderness. Lungs: clear to auscultation bilaterally Heart: regular rate and rhythm, S1, S2 normal, no murmur, click, rub or gallop Abdomen: soft, non-tender; bowel sounds normal; no masses,  no organomegaly Pulses: 2+ and symmetric Skin: Skin color, texture, turgor normal. No rashes or lesions Lymph nodes: Cervical, supraclavicular, and axillary nodes normal.  Assessment and Plan:  Hypertension Uncontrolled on current regimen.  Will resume labetalol,  Increase amlodipine to 5 mg daily    Updated Medication List Outpatient Encounter Prescriptions as of 02/24/2012  Medication Sig Dispense Refill  . ALPRAZolam (XANAX) 0.25 MG tablet Take 1 tablet (  0.25 mg total) by mouth 2 (two) times daily as needed.  180 tablet  3  . amLODipine (NORVASC) 5 MG tablet 1 to 2 tablet daily as needed for hypertension  90 tablet  3  . aspirin 81 MG tablet Take 81 mg by mouth daily.      . cyclobenzaprine (FLEXERIL) 5 MG tablet Take 1 tablet (5 mg total) by mouth 3 (three) times daily as needed.  90 tablet  3  . estradiol (ESTRACE) 0.1 MG/GM vaginal cream Place 0.25 Applicatorfuls vaginally 2 (two) times a week. Apply vaginally as directed.  42.5 g  11  . hydrochlorothiazide (HYDRODIURIL) 25 MG tablet Take 1 tablet (25 mg total) by mouth daily.  90 tablet  3  . HYDROcodone-acetaminophen  (LORTAB) 10-500 MG per tablet Take 1 tablet by mouth every 6 (six) hours as needed.  120 tablet  1  . Menthol, Topical Analgesic, (BIOFREEZE EX) Apply topically.      . traMADol (ULTRAM) 50 MG tablet Take 2 tablets (100 mg total) by mouth every 8 (eight) hours as needed for pain.  180 tablet  5  . valsartan (DIOVAN) 160 MG tablet Take 1 tablet (160 mg total) by mouth daily.  90 tablet  3  . DISCONTD: amLODipine (NORVASC) 2.5 MG tablet Take 1 tablet (2.5 mg total) by mouth daily.  90 tablet  3  . DISCONTD: metoprolol succinate (TOPROL-XL) 100 MG 24 hr tablet Take 1 tablet (100 mg total) by mouth daily. Take with or immediately following a meal.  90 tablet  3     Orders Placed This Encounter  Procedures  . Lower Extremity Venous Duplex Right    No Follow-up on file.

## 2012-02-24 NOTE — Telephone Encounter (Signed)
Rx called to [facility] pharmacy, number [id number] with 1 refill.  Controlled substance can only have 6 month of refills.

## 2012-02-24 NOTE — Assessment & Plan Note (Signed)
Uncontrolled on current regimen.  Will resume labetalol,  Increase amlodipine to 5 mg daily

## 2012-02-24 NOTE — Telephone Encounter (Signed)
Ok to refill 

## 2012-02-24 NOTE — Patient Instructions (Addendum)
I am ordering an ultrasound of your right calf to rule out a blood clot  Please stop all NSAIDs as they may be elevating your blood pressure  Increase the amlodipine to 5 mg daily starting tonight   Resume the labetalol 200 mg tonight, starting tonight and stop the Toprol

## 2012-02-24 NOTE — Telephone Encounter (Signed)
Patient has appointment scheduled for 02/24/12

## 2012-02-26 ENCOUNTER — Other Ambulatory Visit: Payer: Self-pay | Admitting: Internal Medicine

## 2012-02-26 LAB — COMPREHENSIVE METABOLIC PANEL
Albumin: 4.2 g/dL (ref 3.4–5.0)
Alkaline Phosphatase: 78 U/L (ref 50–136)
Anion Gap: 9 (ref 7–16)
BUN: 18 mg/dL (ref 7–18)
Bilirubin,Total: 0.5 mg/dL (ref 0.2–1.0)
Calcium, Total: 9.3 mg/dL (ref 8.5–10.1)
Chloride: 101 mmol/L (ref 98–107)
Co2: 30 mmol/L (ref 21–32)
Creatinine: 0.86 mg/dL (ref 0.60–1.30)
EGFR (African American): >60
EGFR (Non-African Amer.): >60
Glucose: 173 mg/dL — ABNORMAL HIGH (ref 65–99)
Osmolality: 285 (ref 275–301)
Potassium: 3.9 mmol/L (ref 3.5–5.1)
SGOT(AST): 21 U/L (ref 15–37)
SGPT (ALT): 25 U/L (ref 12–78)
Sodium: 140 mmol/L (ref 136–145)
Total Protein: 7.6 g/dL (ref 6.4–8.2)

## 2012-03-02 ENCOUNTER — Encounter: Payer: Self-pay | Admitting: Internal Medicine

## 2012-03-05 ENCOUNTER — Telehealth: Payer: Self-pay | Admitting: Internal Medicine

## 2012-03-05 NOTE — Telephone Encounter (Signed)
The complete metabolic panel that she had drawn on October 16 had an elevated glucose level. I need to know whether she was fasting or not and this was drawn because if she was fasting this represents new-onset diabetes

## 2012-03-05 NOTE — Telephone Encounter (Signed)
Left message on patient vm to call me back regarding labs.

## 2012-03-06 NOTE — Telephone Encounter (Signed)
Left message on patient vm to call me back regarding lab results. Letter sent to patient.

## 2012-03-08 ENCOUNTER — Telehealth: Payer: Self-pay | Admitting: Internal Medicine

## 2012-03-08 NOTE — Telephone Encounter (Signed)
I received an additional copy of her labs from October 16.. She had a hemoglobin A1c which was elevated at 6.4. Normal is<, 6.0) This suggests that she may be developing adult onset diabetes. She would not need medications at this time but she does need to make an appointment to discuss how to prevent this from becoming a problem.

## 2012-03-09 ENCOUNTER — Telehealth: Payer: Self-pay | Admitting: Internal Medicine

## 2012-03-09 ENCOUNTER — Encounter: Payer: Self-pay | Admitting: Internal Medicine

## 2012-03-09 NOTE — Telephone Encounter (Signed)
Left message via phone asking patient to call me back regarding labs.

## 2012-03-09 NOTE — Telephone Encounter (Signed)
The renal artery duplex was normal except for a cyst on the right kidney which does not require further workup and is not clinically significant .   No stenosis seen

## 2012-03-10 NOTE — Telephone Encounter (Signed)
Patient called back to schedule appt to discuss possible diabetes.

## 2012-03-23 ENCOUNTER — Encounter: Payer: Self-pay | Admitting: Internal Medicine

## 2012-03-23 ENCOUNTER — Ambulatory Visit (INDEPENDENT_AMBULATORY_CARE_PROVIDER_SITE_OTHER): Payer: PRIVATE HEALTH INSURANCE | Admitting: Internal Medicine

## 2012-03-23 VITALS — BP 132/72 | HR 74 | Temp 99.0°F | Ht 66.5 in | Wt 190.5 lb

## 2012-03-23 DIAGNOSIS — I10 Essential (primary) hypertension: Secondary | ICD-10-CM

## 2012-03-23 DIAGNOSIS — E669 Obesity, unspecified: Secondary | ICD-10-CM

## 2012-03-23 DIAGNOSIS — E119 Type 2 diabetes mellitus without complications: Secondary | ICD-10-CM

## 2012-03-23 DIAGNOSIS — E1169 Type 2 diabetes mellitus with other specified complication: Secondary | ICD-10-CM

## 2012-03-23 MED ORDER — LABETALOL HCL 200 MG PO TABS: 200.0000 mg | ORAL_TABLET | Freq: Two times a day (BID) | ORAL | Status: DC

## 2012-03-23 MED ORDER — HYDROCODONE-ACETAMINOPHEN 10-500 MG PO TABS: 1.0000 | ORAL_TABLET | Freq: Four times a day (QID) | ORAL | Status: DC | PRN

## 2012-03-23 MED ORDER — ESTRADIOL 0.1 MG/GM VA CREA: 2.0000 g | TOPICAL_CREAM | VAGINAL | Status: DC

## 2012-03-23 NOTE — Patient Instructions (Addendum)
For bp:  Stop amlodipine.  Take 2.5 mg only if bp > 160  Continue diovan and HCTZ,  And labetalol 200 mg twice daily    [place] makes a low carb whole wheat tortilla 6 net carbs 26 g fibers  [place] make al ow carb low cal tortilla available at food [employer] in 3 months    Will send eferral to [facility] for colonoscopy and clearance letter  With goal by end of year

## 2012-03-23 NOTE — Progress Notes (Signed)
Patient ID: Courtney Lopez, female   DOB: [birth year], 51 y.o.   MRN: [id number]   Patient Active Problem List  Diagnosis  . Cervicalgia  . Hypertension  . Screening for colon cancer  . Screening for breast cancer  . Obesity (BMI 30-39.9)  . Hyperlipidemia LDL goal < 100  . Diabetes mellitus type 2 in obese  . Aortic stenosis    Subjective:  CC:   Chief Complaint  Patient presents with  . Follow-up    HPI:   Courtney Lopez a 51 y.o. female who presents for follow up on multiple issues including 1) hypertension and elevated pulse.  She had recurrent hypotension on amlodipine and has stopped it.  Did not tolerate Toprol. Her blood pressures are now better controlled on diovan, labetalol and hctz. 2) Elevated glucose.  a1c 6.5with a  Fasting blood sugar of 175.  Since this news she has altered her diet and has increased her intake of fresh vegetables with juicing.  She is followimg weight watchers recommendations and has lost 2 lbs thus far.  3) calf pain after 2 days of inactivity. Pain has resolved and venous doppler was negative for DVT,    Past Medical History  Diagnosis Date  . Metatarsal fracture 2007    5th, from wearing high heels  . Unspecified essential hypertension   . Other symptoms involving abdomen and pelvis   . Cervicalgia   . Breast screening, unspecified   . Retinal hemorrhage   . Symptomatic menopausal or female climacteric states   . Aortic valve disorders   . Aortic stenosis     Past Surgical History  Procedure Date  . Gyn surgery     hysterectomy- done for adenomysosis and endometriosis  . Oophorectomy     unilateral, due to ruptured ovarian cysts  . Osteotomy maxillary     due to bite problems  . Appendectomy     done during oophorectomy  . Cesarean section     x 2  . Tubal ligation   . Abdominal hysterectomy          The following portions of the patient's history were reviewed and updated as appropriate: Allergies, current  medications, and problem list.    Review of Systems:   12 Pt  review of systems was negative except those addressed in the HPI,     History   Social History  . Marital Status: Married    Spouse Name: N/A    Number of Children: 3  . Years of Education: N/A   Occupational History  . Not on file.   Social History Main Topics  . Smoking status: Never Smoker   . Smokeless tobacco: Never Used  . Alcohol Use: No  . Drug Use: No  . Sexually Active: Not on file   Other Topics Concern  . Not on file   Social History Narrative   Lives with spouse.    Objective:  BP 132/72  Pulse 74  Temp 99 F (37.2 C) (Oral)  Ht 5' 6.5" (1.689 m)  Wt 190 lb 8 oz (86.41 kg)  BMI 30.29 kg/m2  SpO2 99%  General appearance: alert, cooperative and appears stated age Ears: normal TM's and external ear canals both ears Throat: lips, mucosa, and tongue normal; teeth and gums normal Neck: no adenopathy, no carotid bruit, supple, symmetrical, trachea midline and thyroid not enlarged, symmetric, no tenderness/mass/nodules Back: symmetric, no curvature. ROM normal. No CVA tenderness. Lungs: clear to auscultation bilaterally Heart: regular rate and  rhythm, S1, S2 normal, no murmur, click, rub or gallop Abdomen: soft, non-tender; bowel sounds normal; no masses,  no organomegaly Pulses: 2+ and symmetric Skin: Skin color, texture, turgor normal. No rashes or lesions Lymph nodes: Cervical, supraclavicular, and axillary nodes normal.  Assessment and Plan:  Diabetes mellitus type 2 in obese a1c if 6,5 ,  Fasting glucose of 179, addressed with diet,  Exzercise recommended,  Low glycemic index discussed,   Hypertension Complicated by aortic stenosis. Htn is now well controlled on current medications.  No changes today. Renal artery stenosis ruled out in September.   Obesity (BMI 30-39.9) Addressed with new dx of diabetes. I have addressed  BMI and recommended a low glycemic index diet utilizing  smaller more frequent meals to increase metabolism.  I have also recommended that patient start exercising with a goal of 30 minutes of aerobic exercise a minimum of 5 days per week.    Updated Medication List Outpatient Encounter Prescriptions as of 03/23/2012  Medication Sig Dispense Refill  . ALPRAZolam (XANAX) 0.25 MG tablet Take 1 tablet (0.25 mg total) by mouth 2 (two) times daily as needed.  180 tablet  3  . aspirin 81 MG tablet Take 81 mg by mouth daily.      . cyclobenzaprine (FLEXERIL) 5 MG tablet Take 1 tablet (5 mg total) by mouth 3 (three) times daily as needed.  90 tablet  3  . estradiol (ESTRACE) 0.1 MG/GM vaginal cream Place 0.25 Applicatorfuls vaginally 2 (two) times a week. Apply vaginally as directed.  42.5 g  11  . hydrochlorothiazide (HYDRODIURIL) 25 MG tablet Take 1 tablet (25 mg total) by mouth daily.  90 tablet  3  . HYDROcodone-acetaminophen (LORTAB) 10-500 MG per tablet Take 1 tablet by mouth every 6 (six) hours as needed.  120 tablet  1  . Menthol, Topical Analgesic, (BIOFREEZE EX) Apply topically.      . traMADol (ULTRAM) 50 MG tablet Take 2 tablets (100 mg total) by mouth every 8 (eight) hours as needed for pain.  180 tablet  5  . valsartan (DIOVAN) 160 MG tablet Take 1 tablet (160 mg total) by mouth daily.  90 tablet  3  . [DISCONTINUED] amLODipine (NORVASC) 5 MG tablet 1 to 2 tablet daily as needed for hypertension  90 tablet  3  . [DISCONTINUED] estradiol (ESTRACE) 0.1 MG/GM vaginal cream Place 0.25 Applicatorfuls vaginally 2 (two) times a week. Apply vaginally as directed.  42.5 g  11  . [DISCONTINUED] HYDROcodone-acetaminophen (LORTAB) 10-500 MG per tablet Take 1 tablet by mouth every 6 (six) hours as needed.  120 tablet  1  . HYDROcodone-acetaminophen (LORTAB 10) 10-500 MG per tablet Take 1 tablet by mouth every 6 (six) hours as needed for pain.  30 tablet  0  . labetalol (NORMODYNE) 200 MG tablet Take 1 tablet (200 mg total) by mouth 2 (two) times daily.  180  tablet  3     Orders Placed This Encounter  Procedures  . HM PAP SMEAR  . HM COLONOSCOPY    No Follow-up on file.

## 2012-03-25 ENCOUNTER — Encounter: Payer: Self-pay | Admitting: Internal Medicine

## 2012-03-25 DIAGNOSIS — I350 Nonrheumatic aortic (valve) stenosis: Secondary | ICD-10-CM | POA: Insufficient documentation

## 2012-03-25 DIAGNOSIS — E669 Obesity, unspecified: Secondary | ICD-10-CM | POA: Insufficient documentation

## 2012-03-25 DIAGNOSIS — E1169 Type 2 diabetes mellitus with other specified complication: Secondary | ICD-10-CM | POA: Insufficient documentation

## 2012-03-25 NOTE — Assessment & Plan Note (Signed)
Addressed with new dx of diabetes. I have addressed  BMI and recommended a low glycemic index diet utilizing smaller more frequent meals to increase metabolism.  I have also recommended that patient start exercising with a goal of 30 minutes of aerobic exercise a minimum of 5 days per week.

## 2012-03-25 NOTE — Assessment & Plan Note (Signed)
a1c if 6,5 ,  Fasting glucose of 179, addressed with diet,  Exzercise recommended,  Low glycemic index discussed,

## 2012-03-25 NOTE — Assessment & Plan Note (Addendum)
Complicated by aortic stenosis. Htn is now well controlled on current medications.  No changes today. Renal artery stenosis ruled out in September.

## 2012-03-31 ENCOUNTER — Encounter: Payer: Self-pay | Admitting: Internal Medicine

## 2012-05-07 ENCOUNTER — Ambulatory Visit: Payer: Self-pay | Admitting: Internal Medicine

## 2012-05-13 LAB — HM MAMMOGRAPHY: HM Mammogram: NORMAL

## 2012-05-21 ENCOUNTER — Encounter: Payer: Self-pay | Admitting: Internal Medicine

## 2012-06-15 ENCOUNTER — Other Ambulatory Visit: Payer: Self-pay | Admitting: *Deleted

## 2012-06-15 MED ORDER — HYDROCODONE-ACETAMINOPHEN 10-500 MG PO TABS: 1.0000 | ORAL_TABLET | Freq: Four times a day (QID) | ORAL | Status: DC | PRN

## 2012-06-15 NOTE — Telephone Encounter (Signed)
There is not 10/325.  Fill the 10/500 #120 2 rerfills

## 2012-06-15 NOTE — Telephone Encounter (Signed)
Pt last seen on 03/23/12. Lortab 10/500 last filled on 03/23/12 #120 with 1 refill. Note in Epic requesting that the tylenol be reduced to 325mg . Please advise if ok to fill and what strength.

## 2012-06-27 ENCOUNTER — Other Ambulatory Visit: Payer: Self-pay

## 2012-08-14 ENCOUNTER — Other Ambulatory Visit: Payer: Self-pay | Admitting: *Deleted

## 2012-08-15 MED ORDER — ALPRAZOLAM 0.25 MG PO TABS: 0.2500 mg | ORAL_TABLET | Freq: Two times a day (BID) | ORAL | Status: DC | PRN

## 2012-08-15 NOTE — Telephone Encounter (Signed)
Ok to refill alprazolam,   Authorized in epic 

## 2012-08-17 NOTE — Telephone Encounter (Signed)
Med faxed on 4/7.

## 2012-08-18 ENCOUNTER — Other Ambulatory Visit: Payer: Self-pay | Admitting: *Deleted

## 2012-08-18 MED ORDER — HYDROCHLOROTHIAZIDE 25 MG PO TABS: 25.0000 mg | ORAL_TABLET | Freq: Every day | ORAL | Status: DC

## 2012-09-25 ENCOUNTER — Other Ambulatory Visit: Payer: Self-pay | Admitting: *Deleted

## 2012-09-27 MED ORDER — TRAMADOL HCL 50 MG PO TABS: 100.0000 mg | ORAL_TABLET | Freq: Three times a day (TID) | ORAL | Status: DC | PRN

## 2012-11-03 ENCOUNTER — Other Ambulatory Visit: Payer: Self-pay | Admitting: *Deleted

## 2012-11-05 MED ORDER — HYDROCODONE-ACETAMINOPHEN 10-500 MG PO TABS: 1.0000 | ORAL_TABLET | Freq: Four times a day (QID) | ORAL | Status: DC | PRN

## 2012-12-23 ENCOUNTER — Other Ambulatory Visit: Payer: Self-pay | Admitting: *Deleted

## 2012-12-24 MED ORDER — VALSARTAN 160 MG PO TABS: 160.0000 mg | ORAL_TABLET | Freq: Every day | ORAL | Status: DC

## 2013-02-01 ENCOUNTER — Ambulatory Visit: Payer: Self-pay | Admitting: Unknown Physician Specialty

## 2013-02-01 LAB — HM COLONOSCOPY: HM Colonoscopy: NORMAL

## 2013-02-09 ENCOUNTER — Other Ambulatory Visit: Payer: Self-pay | Admitting: Internal Medicine

## 2013-02-09 ENCOUNTER — Encounter: Payer: Self-pay | Admitting: *Deleted

## 2013-02-10 ENCOUNTER — Encounter: Payer: Self-pay | Admitting: Internal Medicine

## 2013-02-10 ENCOUNTER — Ambulatory Visit (INDEPENDENT_AMBULATORY_CARE_PROVIDER_SITE_OTHER): Payer: 59 | Admitting: Internal Medicine

## 2013-02-10 VITALS — BP 116/78 | HR 74 | Temp 98.1°F | Resp 14 | Ht 66.5 in | Wt 188.8 lb

## 2013-02-10 DIAGNOSIS — E1169 Type 2 diabetes mellitus with other specified complication: Secondary | ICD-10-CM

## 2013-02-10 DIAGNOSIS — I10 Essential (primary) hypertension: Secondary | ICD-10-CM

## 2013-02-10 DIAGNOSIS — E119 Type 2 diabetes mellitus without complications: Secondary | ICD-10-CM

## 2013-02-10 DIAGNOSIS — M542 Cervicalgia: Secondary | ICD-10-CM

## 2013-02-10 DIAGNOSIS — E785 Hyperlipidemia, unspecified: Secondary | ICD-10-CM

## 2013-02-10 DIAGNOSIS — E669 Obesity, unspecified: Secondary | ICD-10-CM

## 2013-02-10 MED ORDER — HYDROCODONE-ACETAMINOPHEN 10-500 MG PO TABS: 1.0000 | ORAL_TABLET | Freq: Four times a day (QID) | ORAL | Status: DC | PRN

## 2013-02-10 MED ORDER — BUTALBITAL-APAP-CAFFEINE 50-325-40 MG PO TABS: 1.0000 | ORAL_TABLET | Freq: Four times a day (QID) | ORAL | Status: DC | PRN

## 2013-02-10 MED ORDER — HYDROCODONE-ACETAMINOPHEN 10-325 MG PO TABS: 1.0000 | ORAL_TABLET | Freq: Three times a day (TID) | ORAL | Status: DC | PRN

## 2013-02-10 NOTE — Progress Notes (Signed)
Patient ID: Courtney Lopez, female   DOB: 1960-07-07, 52 y.o.   MRN: [id number]   Patient Active Problem List   Diagnosis Date Noted  . Diabetes mellitus type 2 in obese 03/25/2012  . Aortic stenosis   . Screening for colon cancer 08/25/2011  . Screening for breast cancer 08/25/2011  . Obesity (BMI 30-39.9) 08/25/2011  . Hyperlipidemia LDL goal < 100 08/25/2011  . Cervicalgia 06/18/2011  . Hypertension 06/18/2011    Subjective:  CC:   Chief Complaint  Patient presents with  . Follow-up    medication refills    HPI:   Courtney Lopez a 52 y.o. female who presents for one year follow up on chronic issues  Including newly diagnosed DM 2, hypertension, obesity and cervical disk disease   .  At last visit patient was diagnosed with type 2 DM after having a fasting glucose of 175.  hgba1c was 6.5   She was encouraged to lose weight  Start an exercise program and follow a low glycemic index diet. She did not follow up,  She returns today for refills on her pain medications. She has had an annual eye exam done last month at [facility] that was reportedly negative for retinopathy.    She has chronic neck pain from degenerative disk disease with foraminal stenosis at C5-C6 level by last MRI February 2013.  Her pain has been managed with 6 tramadol daily .  She uses tramadol regularly  Averaging 2 every 8 hours.,  But has occasional days where she has to use vicodin in addition.  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   Prior managemtn by the Pain clinic , trials of neurontin, and Nucynta.  Uses  biofreeze daily amd sleeps with a cervical pillow.  There is no radiation to arms or arm weakness.    Hypertension: bps have been well controlled at work on diovan, labetalol and hctz.    Past Medical History  Diagnosis Date  . Metatarsal fracture 2007    5th, from wearing high heels  . Unspecified essential  hypertension   . Other symptoms involving abdomen and pelvis(789.9)   . Cervicalgia   . Breast screening, unspecified   . Retinal hemorrhage   . Symptomatic menopausal or female climacteric states   . Aortic valve disorders   . Aortic stenosis     Past Surgical History  Procedure Laterality Date  . Gyn surgery      hysterectomy- done for adenomysosis and endometriosis  . Oophorectomy      unilateral, due to ruptured ovarian cysts  . Osteotomy maxillary      due to bite problems  . Appendectomy      done during oophorectomy  . Cesarean section      x 2  . Tubal ligation    . Abdominal hysterectomy         The following portions of the patient's history were reviewed and updated as appropriate: Allergies, current medications, and problem list.    Review of Systems:   12 Pt  review of systems was negative except those addressed in the HPI,     History   Social History  . Marital Status: Married    Spouse Name: N/A    Number of Children: 3  . Years of Education: N/A   Occupational History  . Not on file.   Social History Main Topics  .  Smoking status: Never Smoker   . Smokeless tobacco: Never Used  . Alcohol Use: No  . Drug Use: No  . Sexual Activity: Not on file   Other Topics Concern  . Not on file   Social History Narrative   Lives with spouse.    Objective:  Filed Vitals:   02/10/13 1610  BP: 116/78  Pulse: 74  Temp: 98.1 F (36.7 C)  Resp: 14     General appearance: alert, cooperative and appears stated age Ears: normal TM's and external ear canals both ears Throat: lips, mucosa, and tongue normal; teeth and gums normal Neck: no adenopathy, no carotid bruit, supple, symmetrical, trachea midline and thyroid not enlarged, symmetric, no tenderness/mass/nodules Back: symmetric, no curvature. ROM normal. No CVA tenderness. Lungs: clear to auscultation bilaterally Heart: regular rate and rhythm, S1, S2 normal, no murmur, click, rub or  gallop Abdomen: soft, non-tender; bowel sounds normal; no masses,  no organomegaly Pulses: 2+ and symmetric Skin: Skin color, texture, turgor normal. No rashes or lesions Lymph nodes: Cervical, supraclavicular, and axillary nodes normal.  Assessment and Plan:  Diabetes mellitus type 2 in obese I have addressed the need for more frequent  follow up with a1cs every 3  To 6 months depending on current a1c which is pending her lab visit to [facility]. .  She has not considered herself a diabetic.  She is on diovan for htn,  Foot exam is normal today and eye exam is up to date.  I have addressed BMI and recommended wt loss of 10% of body weight over the next 6 months using a low glycemic index diet and regular exercise a minimum of 5 days per week.    Cervicalgia Discussed her regular use of tramadol and alternatives to vicodin.  Trial of fioricet   Hyperlipidemia LDL goal < 100 Repeat lipids due,  Will add statin if LDL is above 100  Hypertension Well controlled on current regimen. Renal function is due, no changes today.  Obesity (BMI 30-39.9) I have addressed  BMI and recommended a low glycemic index diet utilizing smaller more frequent meals to increase metabolism.  I have also recommended that patient start exercising with a goal of 30 minutes of aerobic exercise a minimum of 5 days per week. Screening for lipid disorders, thyroid and diabetes to be done today.   A total of 40 minutes was spent with patient more than half of which was spent in counseling on diabetes and obesity , reviewing records and health maintenance and coordination of care.  Updated Medication List Outpatient Encounter Prescriptions as of 02/10/2013  Medication Sig Dispense Refill  . ALPRAZolam (XANAX) 0.25 MG tablet Take 1 tablet (0.25 mg total) by mouth 2 (two) times daily as needed.  180 tablet  3  . aspirin 81 MG tablet Take 81 mg by mouth daily.      . cyclobenzaprine (FLEXERIL) 5 MG tablet Take 1 tablet (5 mg  total) by mouth 3 (three) times daily as needed.  90 tablet  3  . estradiol (ESTRACE) 0.1 MG/GM vaginal cream Place 0.25 Applicatorfuls vaginally 2 (two) times a week. Apply vaginally as directed.  42.5 g  11  . hydrochlorothiazide (HYDRODIURIL) 25 MG tablet Take 1 tablet (25 mg total) by mouth daily.  90 tablet  3  . labetalol (NORMODYNE) 200 MG tablet Take 1 tablet (200 mg total) by mouth 2 (two) times daily.  180 tablet  3  . Menthol, Topical Analgesic, (BIOFREEZE EX) Apply topically.      [name]  traMADol (ULTRAM) 50 MG tablet Take 2 tablets (100 mg total) by mouth every 8 (eight) hours as needed for pain.  180 tablet  5  . valsartan (DIOVAN) 160 MG tablet Take 1 tablet (160 mg total) by mouth daily.  30 tablet  0  . [DISCONTINUED] HYDROcodone-acetaminophen (LORTAB 10) 10-500 MG per tablet Take 1 tablet by mouth every 6 (six) hours as needed for pain.  30 tablet  0  . [DISCONTINUED] HYDROcodone-acetaminophen (LORTAB 10) 10-500 MG per tablet Take 1 tablet by mouth every 6 (six) hours as needed for pain.  30 tablet  0  . [DISCONTINUED] HYDROcodone-acetaminophen (LORTAB) 10-500 MG per tablet Take 1 tablet by mouth every 6 (six) hours as needed.  120 tablet  1  . butalbital-acetaminophen-caffeine (FIORICET) 50-325-40 MG per tablet Take 1-2 tablets by mouth every 6 (six) hours as needed for pain.  60 tablet  1  . HYDROcodone-acetaminophen (NORCO) 10-325 MG per tablet Take 1 tablet by mouth every 8 (eight) hours as needed for pain.  30 tablet  3   No facility-administered encounter medications on file as of 02/10/2013.     Orders Placed This Encounter  Procedures  . HM MAMMOGRAPHY    No Follow-up on file.

## 2013-02-10 NOTE — Patient Instructions (Addendum)
I recommend that you try using fioricet for breakthrough pain instead of the vicodin (since is has caffeine in it)   This is my version of a  "Low GI"  Diet:  It will  lower your blood sugars and allow you to lose 4 to 8  lbs  per month if you follow it carefully.  Your goal with exercise is a minimum of 30 minutes of aerobic exercise 5 days per week (Walking does not count once it becomes easy!)    All of the foods can be found at grocery stores and in bulk at [employer].  The Atkins protein bars and shakes are available in more varieties at Target, WalMart and [employer].     7 AM Breakfast:  Choose from the following:  Low carbohydrate Protein  Shakes (I recommend the EAS AdvantEdge "Carb Control" shakes  Or the low carb shakes by Atkins.    2.5 carbs   Arnold's "Sandwhich Thin"toasted  w/ peanut butter (no jelly: about 20 net carbs  "Bagel Thin" with cream cheese and salmon: about 20 carbs   a scrambled egg/bacon/cheese burrito made with Mission's "carb balance" whole wheat tortilla  (about 10 net carbs )   Avoid cereal and bananas, oatmeal and cream of wheat and grits. They are loaded with carbohydrates!   10 AM: high protein snack  Protein bar by Atkins (the snack size, under 200 cal, usually < 6 net carbs).    A stick of cheese:  Around 1 carb,  100 cal     Dannon Light n Fit [place] Yogurt  (80 cal, 8 carbs)  Other so called "protein bars" and Greek yogurts tend to be loaded with carbohydrates.  Remember, in food advertising, the word "energy" is synonymous for " carbohydrate."  Lunch:   A Sandwich using the bread choices listed, Can use any  Eggs,  lunchmeat, grilled meat or canned tuna), avocado, regular mayo/mustard  and cheese.  A Salad using blue cheese, ranch,  Goddess or vinagrette,  No croutons or "confetti" and no "candied nuts" but regular nuts OK.   No pretzels or chips.  Pickles and miniature sweet peppers are a good low carb alternative that provide a "crunch"  The bread  is the only source of carbohydrate in a sandwich and  can be decreased by trying some of these alternatives to traditional loaf bread  Joseph's makes a pita bread and a flat bread that are 50 cal and 4 net carbs available at BJs and WalMart.  This can be toasted to use with hummous as well  Toufayan makes a low carb flatbread that's 100 cal and 9 net carbs available at [employer] and [employer] makes 2 sizes of  Low carb whole wheat tortilla  (The large one is 210 cal and 6 net carbs) Avoid "Low fat dressings, as well as Reyne Dumas and [address] dressings They are loaded with sugar!   3 PM/ Mid day  Snack:  Consider  1 ounce of  almonds, walnuts, pistachios, pecans, peanuts,  Macadamia nuts or a nut medley.  Avoid "granola"; the dried cranberries and raisins are loaded with carbohydrates. Mixed nuts as long as there are no raisins,  cranberries or dried fruit.     6 PM  Dinner:     Meat/fowl/fish with a green salad, and either broccoli, cauliflower, green beans, spinach, brussel sprouts or  Lima beans. DO NOT BREAD THE PROTEIN!!      There is a low  carb pasta by Dreamfield's that is acceptable and tastes great: only 5 digestible carbs/serving.( All grocery stores but BJs carry it )  Try Kai Levins Angelo's chicken piccata or chicken or eggplant parm over low carb pasta.(Lowes and BJs)   Clifton Custard Sanchez's "Carnitas" (pulled pork, no sauce,  0 carbs) or his beef pot roast to make a dinner burrito (at [employer]J's)  Pesto over low carb pasta (bj's sells a good quality pesto in the center refrigerated section of the deli   Whole wheat pasta is still full of digestible carbs and  Not as low in glycemic index as Dreamfield's.   Brown rice is still rice,  So skip the rice and noodles if you eat [place] or [place] (or at least limit to 1/2 cup)  9 PM snack :   Breyer's "low carb" fudgsicle or  ice cream bar (Carb Smart line), or  Weight Watcher's ice cream bar , or another "no sugar added" ice cream;  a  serving of fresh berries/cherries with whipped cream   Cheese or DANNON'S LlGHT N FIT GREEK YOGURT  Avoid bananas, pineapple, grapes  and watermelon on a regular basis because they are high in sugar.  THINK OF THEM AS DESSERT  Remember that snack Substitutions should be less than 10 NET carbs per serving and meals < 20 carbs. Remember to subtract fiber grams to get the "net carbs."

## 2013-02-11 NOTE — Assessment & Plan Note (Addendum)
Repeat lipids due,  Will add statin if LDL is above 100

## 2013-02-11 NOTE — Assessment & Plan Note (Signed)
I have addressed  BMI and recommended a low glycemic index diet utilizing smaller more frequent meals to increase metabolism.  I have also recommended that patient start exercising with a goal of 30 minutes of aerobic exercise a minimum of 5 days per week. Screening for lipid disorders, thyroid and diabetes to be done today.   

## 2013-02-11 NOTE — Assessment & Plan Note (Signed)
Discussed her regular use of tramadol and alternatives to vicodin.  Trial of fioricet

## 2013-02-11 NOTE — Assessment & Plan Note (Signed)
I have addressed the need for more frequent  follow up with a1cs every 3  To 6 months depending on current a1c which is pending her lab visit to [facility]. .  She has not considered herself a diabetic.  She is on diovan for htn,  Foot exam is normal today and eye exam is up to date.  I have addressed BMI and recommended wt loss of 10% of body weight over the next 6 months using a low glycemic index diet and regular exercise a minimum of 5 days per week.

## 2013-02-11 NOTE — Assessment & Plan Note (Signed)
Well controlled on current regimen. Renal function is due, no changes today. °

## 2013-02-17 ENCOUNTER — Other Ambulatory Visit: Payer: Self-pay | Admitting: Internal Medicine

## 2013-02-17 LAB — COMPREHENSIVE METABOLIC PANEL
Albumin: 4.1 g/dL (ref 3.4–5.0)
Alkaline Phosphatase: 78 U/L (ref 50–136)
Anion Gap: 7 (ref 7–16)
BUN: 23 mg/dL — ABNORMAL HIGH (ref 7–18)
Bilirubin,Total: 0.5 mg/dL (ref 0.2–1.0)
Calcium, Total: 9.2 mg/dL (ref 8.5–10.1)
Chloride: 100 mmol/L (ref 98–107)
Co2: 29 mmol/L (ref 21–32)
Creatinine: 0.88 mg/dL (ref 0.60–1.30)
EGFR (African American): >60
EGFR (Non-African Amer.): >60
Glucose: 147 mg/dL — ABNORMAL HIGH (ref 65–99)
Osmolality: 278 (ref 275–301)
Potassium: 3.6 mmol/L (ref 3.5–5.1)
SGOT(AST): 28 U/L (ref 15–37)
SGPT (ALT): 29 U/L (ref 12–78)
Sodium: 136 mmol/L (ref 136–145)
Total Protein: 7.7 g/dL (ref 6.4–8.2)

## 2013-02-17 LAB — CBC WITH DIFFERENTIAL/PLATELET
Basophil #: 0 10*3/uL (ref 0.0–0.1)
Basophil %: 0.5 %
Eosinophil #: 0.1 10*3/uL (ref 0.0–0.7)
Eosinophil %: 1.6 %
HCT: 37.3 % (ref 35.0–47.0)
HGB: 13.1 g/dL (ref 12.0–16.0)
Lymphocyte #: 2 10*3/uL (ref 1.0–3.6)
Lymphocyte %: 23.9 %
MCH: 29.3 pg (ref 26.0–34.0)
MCHC: 35 g/dL (ref 32.0–36.0)
MCV: 84 fL (ref 80–100)
Monocyte #: 0.7 x10 3/mm (ref 0.2–0.9)
Monocyte %: 8.8 %
Neutrophil #: 5.4 10*3/uL (ref 1.4–6.5)
Neutrophil %: 65.2 %
Platelet: 291 10*3/uL (ref 150–440)
RBC: 4.46 10*6/uL (ref 3.80–5.20)
RDW: 12.9 % (ref 11.5–14.5)
WBC: 8.3 10*3/uL (ref 3.6–11.0)

## 2013-02-17 LAB — LIPID PANEL
Cholesterol: 175 mg/dL (ref 0–200)
Cholesterol: 175 mg/dL (ref 0–200)
HDL Cholesterol: 53 mg/dL (ref 40–60)
HDL: 53 mg/dL (ref 35–70)
LDL Cholesterol: 102 mg/dL
Ldl Cholesterol, Calc: 102 mg/dL — ABNORMAL HIGH (ref 0–100)
Triglycerides: 100 mg/dL (ref 0–200)
Triglycerides: 100 mg/dL (ref 40–160)
VLDL Cholesterol, Calc: 20 mg/dL (ref 5–40)

## 2013-02-17 LAB — HEPATIC FUNCTION PANEL
ALT: 29 U/L (ref 7–35)
AST: 28 U/L (ref 13–35)

## 2013-02-17 LAB — HEMOGLOBIN A1C
Hemoglobin A1C: 7.2 % — ABNORMAL HIGH (ref 4.2–6.3)
Hgb A1c MFr Bld: 7.2 % — AB (ref 4.0–6.0)

## 2013-02-17 LAB — CBC AND DIFFERENTIAL
Hemoglobin: 13.1 g/dL (ref 12.0–16.0)
Platelets: 291 10*3/uL (ref 150–399)
WBC: 8.3 10^3/mL

## 2013-02-17 LAB — BASIC METABOLIC PANEL
BUN: 23 mg/dL — AB (ref 4–21)
Creatinine: 0.9 mg/dL (ref 0.5–1.1)
Glucose: 147 mg/dL
Sodium: 136 mmol/L — AB (ref 137–147)

## 2013-02-17 LAB — HM DIABETES FOOT EXAM: HM Diabetic Foot Exam: NORMAL

## 2013-02-21 ENCOUNTER — Telehealth: Payer: Self-pay | Admitting: Internal Medicine

## 2013-02-21 NOTE — Telephone Encounter (Signed)
Her labs were received, but an A1c was not done. She has diabetes and this was an important test ot have  She will need to go back to [facility] for this test. The other labs were normal

## 2013-02-22 ENCOUNTER — Other Ambulatory Visit: Payer: Self-pay | Admitting: *Deleted

## 2013-02-23 MED ORDER — ALPRAZOLAM 0.25 MG PO TABS: 0.2500 mg | ORAL_TABLET | Freq: Two times a day (BID) | ORAL | Status: DC | PRN

## 2013-02-23 NOTE — Telephone Encounter (Signed)
Left message, notifying pt of need for A1C and to call back if needs order

## 2013-02-25 ENCOUNTER — Other Ambulatory Visit: Payer: Self-pay | Admitting: Internal Medicine

## 2013-02-25 ENCOUNTER — Telehealth: Payer: Self-pay | Admitting: Internal Medicine

## 2013-02-25 NOTE — Telephone Encounter (Signed)
All labs normal,. cholesterol .  A1c is 7.2    She definitely has diabetes  Needs to follow a low GI diet and repeat labs in 3 months prior to next visit

## 2013-02-26 NOTE — Telephone Encounter (Signed)
Left message, notifying pt of results 

## 2013-03-09 ENCOUNTER — Encounter: Payer: Self-pay | Admitting: Internal Medicine

## 2013-03-10 ENCOUNTER — Telehealth: Payer: Self-pay | Admitting: Internal Medicine

## 2013-03-10 NOTE — Telephone Encounter (Signed)
This was a message via EP[facility] for an appointment. I have scheduled her for 05/17/2013 at 4:00. Please see the message below for the lab orders.  Appointment Request From: Courtney Lopez      With Provider: Duncan Dull, MD [-Primary Care Physician-]      Preferred Date Range: From 05/27/2013 To 05/28/2013      Preferred Times: Thursday Afternoon, Friday Afternoon      Reason for visit: Office Visit      Comments:   This is follow-up visit (as instructed) due to my recent labs. I need to do repeat H-A1C and fasting glucose prior to this appt. Will you send me a form or do I need to pick it up?         Also, can you please tell me what my last weight was on Oct. 1, 2014. I am following the diet she gave me closely and want to monitor weight.   Thanks in advance.    Courtney Lopez

## 2013-03-11 NOTE — Telephone Encounter (Signed)
Please advise as to labs,

## 2013-04-07 ENCOUNTER — Other Ambulatory Visit: Payer: Self-pay | Admitting: *Deleted

## 2013-04-07 MED ORDER — LABETALOL HCL 200 MG PO TABS: 200.0000 mg | ORAL_TABLET | Freq: Two times a day (BID) | ORAL | Status: DC

## 2013-04-09 ENCOUNTER — Telehealth: Payer: Self-pay | Admitting: Internal Medicine

## 2013-04-09 NOTE — Telephone Encounter (Signed)
Pt came by office and is saying that her pharmacy has faxed over rx request for her b/p meds but nothing as been called in. They gave her some to get her by but she is needing them called in today.

## 2013-04-09 NOTE — Telephone Encounter (Signed)
Spoke with pharmacist @ [facility] pharmacy-he states that it has already been taken care of

## 2013-04-26 ENCOUNTER — Other Ambulatory Visit: Payer: Self-pay

## 2013-04-26 ENCOUNTER — Other Ambulatory Visit: Payer: Self-pay | Admitting: Internal Medicine

## 2013-04-26 NOTE — Telephone Encounter (Signed)
Ok refill? Last visit 02/11/13

## 2013-04-27 ENCOUNTER — Other Ambulatory Visit: Payer: Self-pay | Admitting: Internal Medicine

## 2013-05-17 ENCOUNTER — Encounter: Payer: Self-pay | Admitting: Internal Medicine

## 2013-05-17 ENCOUNTER — Ambulatory Visit (INDEPENDENT_AMBULATORY_CARE_PROVIDER_SITE_OTHER): Payer: 59 | Admitting: Internal Medicine

## 2013-05-17 VITALS — BP 178/98 | HR 72 | Temp 98.9°F | Wt 179.2 lb

## 2013-05-17 DIAGNOSIS — E669 Obesity, unspecified: Secondary | ICD-10-CM

## 2013-05-17 DIAGNOSIS — I10 Essential (primary) hypertension: Secondary | ICD-10-CM

## 2013-05-17 DIAGNOSIS — E119 Type 2 diabetes mellitus without complications: Secondary | ICD-10-CM

## 2013-05-17 DIAGNOSIS — M542 Cervicalgia: Secondary | ICD-10-CM

## 2013-05-17 DIAGNOSIS — E1169 Type 2 diabetes mellitus with other specified complication: Secondary | ICD-10-CM

## 2013-05-17 LAB — HM DIABETES FOOT EXAM: HM Diabetic Foot Exam: NORMAL

## 2013-05-17 LAB — HM DIABETES EYE EXAM

## 2013-05-17 MED ORDER — HYDROCODONE-ACETAMINOPHEN 10-325 MG PO TABS: 1.0000 | ORAL_TABLET | Freq: Three times a day (TID) | ORAL | Status: DC | PRN

## 2013-05-17 MED ORDER — TRAMADOL HCL 50 MG PO TABS: ORAL_TABLET | ORAL | Status: DC

## 2013-05-17 MED ORDER — AMLODIPINE BESYLATE 5 MG PO TABS: 5.0000 mg | ORAL_TABLET | Freq: Every day | ORAL | Status: DC

## 2013-05-17 MED ORDER — ALPRAZOLAM 0.25 MG PO TABS: 0.2500 mg | ORAL_TABLET | Freq: Two times a day (BID) | ORAL | Status: DC | PRN

## 2013-05-17 NOTE — Patient Instructions (Signed)
You are doing great !!!!  Here's another copy of the diet    7 AM Breakfast:  Choose from the following:  Low carbohydrate Protein  Shakes (I recommend the EAS AdvantEdge "Carb Control" shakes  Or the low carb shakes by Atkins.    2.5 carbs   Arnold's "Sandwhich Thin"toasted  w/ peanut butter (no jelly: about 20 net carbs  "Bagel Thin" with cream cheese and salmon: about 20 carbs   a scrambled egg/bacon/cheese burrito made with [employer] "carb balance" whole wheat tortilla  (about 10 net carbs )   Avoid cereal and bananas, oatmeal and cream of wheat and grits. They are loaded with carbohydrates!   10 AM: high protein snack  Protein bar by Atkins (the snack size, under 200 cal, usually < 6 net carbs).    A stick of cheese:  Around 1 carb,  100 cal     Dannon Light n Fit [place] Yogurt  (80 cal, 8 carbs)  Other so called "protein bars" and Greek yogurts tend to be loaded with carbohydrates.  Remember, in food advertising, the word "energy" is synonymous for " carbohydrate."  Lunch:   A Sandwich using the bread choices listed, Can use any  Eggs,  lunchmeat, grilled meat or canned tuna), avocado, regular mayo/mustard  and cheese.  A Salad using blue cheese, ranch,  Goddess or vinagrette,  No croutons or "confetti" and no "candied nuts" but regular nuts OK.   No pretzels or chips.  Pickles and miniature sweet peppers are a good low carb alternative that provide a "crunch"  The bread is the only source of carbohydrate in a sandwich and  can be decreased by trying some of these alternatives to traditional loaf bread  Joseph's makes a pita bread and a flat bread that are 50 cal and 4 net carbs available at BJs and WalMart.  This can be toasted to use with hummous as well  Toufayan makes a low carb flatbread that's 100 cal and 9 net carbs available at [employer] and [employer] makes 2 sizes of  Low carb whole wheat tortilla  (The large one is 210 cal and 6 net carbs) Avoid "Low fat dressings, as  well as Reyne Dumas and [address] dressings They are loaded with sugar!   3 PM/ Mid day  Snack:  Consider  1 ounce of  almonds, walnuts, pistachios, pecans, peanuts,  Macadamia nuts or a nut medley.  Avoid "granola"; the dried cranberries and raisins are loaded with carbohydrates. Mixed nuts as long as there are no raisins,  cranberries or dried fruit.     6 PM  Dinner:     Meat/fowl/fish with a green salad, and either broccoli, cauliflower, green beans, spinach, brussel sprouts or  Lima beans. DO NOT BREAD THE PROTEIN!!      There is a low carb pasta by Dreamfield's that is acceptable and tastes great: only 5 digestible carbs/serving.( All grocery stores but BJs carry it )  Try Kai Levins Angelo's chicken piccata or chicken or eggplant parm over low carb pasta.(Lowes and BJs)   Clifton Custard Sanchez's "Carnitas" (pulled pork, no sauce,  0 carbs) or his beef pot roast to make a dinner burrito (at BJ's)  Pesto over low carb pasta (bj's sells a good quality pesto in the center refrigerated section of the deli   Whole wheat pasta is still full of digestible carbs and  Not as low in glycemic index as Dreamfield's.   Brown rice is still rice,  So skip the rice and noodles if you eat [place] or [place] (or at least limit to 1/2 cup)  9 PM snack :   Breyer's "low carb" fudgsicle or  ice cream bar (Carb Smart line), or  Weight Watcher's ice cream bar , or another "no sugar added" ice cream;  a serving of fresh berries/cherries with whipped cream   Cheese or DANNON'S LlGHT N [place] YOGURT  Avoid bananas, pineapple, grapes  and watermelon on a regular basis because they are high in sugar.  THINK OF THEM AS DESSERT  Remember that snack Substitutions should be less than 10 NET carbs per serving and meals < 20 carbs. Remember to subtract fiber grams to get the "net carbs."

## 2013-05-17 NOTE — Progress Notes (Signed)
Pre-visit discussion using our clinic review tool. No additional management support is needed unless otherwise documented below in the visit note.  

## 2013-05-18 ENCOUNTER — Encounter: Payer: Self-pay | Admitting: Internal Medicine

## 2013-05-18 NOTE — Progress Notes (Signed)
Patient ID: Courtney Lopez, female   DOB: 01/27/1961, 53 y.o.   MRN: [id number]   Patient Active Problem List   Diagnosis Date Noted  . Diabetes mellitus type 2 in obese 03/25/2012  . Aortic stenosis   . Screening for colon cancer 08/25/2011  . Screening for breast cancer 08/25/2011  . Obesity (BMI 30-39.9) 08/25/2011  . Hyperlipidemia LDL goal < 100 08/25/2011  . Cervicalgia 06/18/2011  . Hypertension 06/18/2011    Subjective:  CC:   Chief Complaint  Patient presents with  . Follow-up    on weight and needs lab work    HPI:   Courtney CornfieldStephanie Brothersis a 53 y.o. female who presents for 3 month follow up on Diabetes mellitus type 2 secondary to Obesity, Chronic back and neck pain and hypertension. bp is elevated today;  She notes transient increases whenever she is getting sick. Uses prn amlodipine to control elevations.,  Taking other meds as prescribed.  Losing weight steadily on low GI diet,  BS improving with diet and exercise,  No  Diabetes medications thus far.  Energy level improved with better eating habits. No new issues.    Past Medical History  Diagnosis Date  . Metatarsal fracture 2007    5th, from wearing high heels  . Unspecified essential hypertension   . Other symptoms involving abdomen and pelvis(789.9)   . Cervicalgia   . Breast screening, unspecified   . Retinal hemorrhage   . Symptomatic menopausal or female climacteric states   . Aortic valve disorders   . Aortic stenosis     Past Surgical History  Procedure Laterality Date  . Gyn surgery      hysterectomy- done for adenomysosis and endometriosis  . Oophorectomy      unilateral, due to ruptured ovarian cysts  . Osteotomy maxillary      due to bite problems  . Appendectomy      done during oophorectomy  . Cesarean section      x 2  . Tubal ligation    . Abdominal hysterectomy         The following portions of the patient's history were reviewed and updated as appropriate: Allergies,  current medications, and problem list.    Review of Systems:   12 Pt  review of systems was negative except those addressed in the HPI,     History   Social History  . Marital Status: Married    Spouse Name: N/A    Number of Children: 3  . Years of Education: N/A   Occupational History  . Not on file.   Social History Main Topics  . Smoking status: Never Smoker   . Smokeless tobacco: Never Used  . Alcohol Use: No  . Drug Use: No  . Sexual Activity: Not on file   Other Topics Concern  . Not on file   Social History Narrative   Lives with spouse.    Objective:  Filed Vitals:   05/17/13 1606  BP: 178/98  Pulse: 72  Temp: 98.9 F (37.2 C)     General appearance: alert, cooperative and appears stated age Ears: normal TM's and external ear canals both ears Throat: lips, mucosa, and tongue normal; teeth and gums normal Neck: no adenopathy, no carotid bruit, supple, symmetrical, trachea midline and thyroid not enlarged, symmetric, no tenderness/mass/nodules Back: symmetric, no curvature. ROM normal. No CVA tenderness. Lungs: clear to auscultation bilaterally Heart: regular rate and rhythm, S1, S2 normal, no murmur, click, rub or gallop Abdomen: soft,  non-tender; bowel sounds normal; no masses,  no organomegaly Pulses: 2+ and symmetric Skin: Skin color, texture, turgor normal. No rashes or lesions Lymph nodes: Cervical, supraclavicular, and axillary nodes normal.  Assessment and Plan:  Diabetes mellitus type 2 in obese Improving control with low GI diet and exercise .  hemoglobin A1c is due . Patient is referred for  eye exam and foot exam was done today.  There is  no proteinuria on prior micro urinalysis .  Fasting lipids will be repeated  and statin therapy advised if indicated by application of new ACC guidelines based on patient's 10 year risk of CAD   Lab Results  Component Value Date   HGBA1C 7.2* 02/17/2013     Cervicalgia Continue pain management  with tramadol and vicodin . Refills given   Hypertension Elevated today.  Reviewed list of meds, patient is not taking OTC meds that could be causing it.,  Adding amlodipine 5 mg daily prn bp > 150    Obesity (BMI 30-39.9) I have congratulated her in reduction of   BMI and encouraged  Continued weight loss with goal of 10% of body weigh over the next 6 months using a low glycemic index diet and regular exercise a minimum of 5 days per week.     Updated Medication List Outpatient Encounter Prescriptions as of 05/17/2013  Medication Sig  . ALPRAZolam (XANAX) 0.25 MG tablet Take 1 tablet (0.25 mg total) by mouth 2 (two) times daily as needed.  [name] aspirin 81 MG tablet Take 81 mg by mouth daily.  . cyclobenzaprine (FLEXERIL) 5 MG tablet Take 1 tablet (5 mg total) by mouth 3 (three) times daily as needed.  [name] ESTRACE VAGINAL 0.1 MG/GM vaginal cream Insert 1/4 applicatorful vaginally 2 (two) times a week as directed.  . hydrochlorothiazide (HYDRODIURIL) 25 MG tablet Take 1 tablet (25 mg total) by mouth daily.  [name] HYDROcodone-acetaminophen (NORCO) 10-325 MG per tablet Take 1 tablet by mouth every 8 (eight) hours as needed.  . labetalol (NORMODYNE) 200 MG tablet Take 1 tablet (200 mg total) by mouth 2 (two) times daily.  . Menthol, Topical Analgesic, (BIOFREEZE EX) Apply topically.  . traMADol (ULTRAM) 50 MG tablet Take 2 tablets by mouth every 6 hours as needed for pain.  . valsartan (DIOVAN) 160 MG tablet Take 1 tablet (160 mg total) by mouth daily.  . [DISCONTINUED] ALPRAZolam (XANAX) 0.25 MG tablet Take 1 tablet (0.25 mg total) by mouth 2 (two) times daily as needed.  . [DISCONTINUED] ALPRAZolam (XANAX) 0.25 MG tablet Take 1 tablet (0.25 mg total) by mouth 2 (two) times daily as needed.  . [DISCONTINUED] HYDROcodone-acetaminophen (NORCO) 10-325 MG per tablet Take 1 tablet by mouth every 8 (eight) hours as needed for pain.  . [DISCONTINUED] traMADol (ULTRAM) 50 MG tablet Take 2 tablets by mouth every  8 hours as needed for pain.  [name] amLODipine (NORVASC) 5 MG tablet Take 1 tablet (5 mg total) by mouth daily.  . [DISCONTINUED] butalbital-acetaminophen-caffeine (FIORICET) 50-325-40 MG per tablet Take 1-2 tablets by mouth every 6 (six) hours as needed for pain.     Orders Placed This Encounter  Procedures  . Ambulatory referral to Ophthalmology  . HM DIABETES EYE EXAM  . HM DIABETES FOOT EXAM    No Follow-up on file.

## 2013-05-18 NOTE — Assessment & Plan Note (Signed)
I have congratulated her in reduction of   BMI and encouraged  Continued weight loss with goal of 10% of body weigh over the next 6 months using a low glycemic index diet and regular exercise a minimum of 5 days per week.    

## 2013-05-18 NOTE — Assessment & Plan Note (Signed)
Continue pain management with tramadol and vicodin . Refills given

## 2013-05-18 NOTE — Assessment & Plan Note (Addendum)
Improving control with low GI diet and exercise .  hemoglobin A1c is due . Patient is referred for  eye exam and foot exam was done today.  There is  no proteinuria on prior micro urinalysis .  Fasting lipids will be repeated  and statin therapy advised if indicated by application of new ACC guidelines based on patient's 10 year risk of CAD   Lab Results  Component Value Date   HGBA1C 7.2* 02/17/2013

## 2013-05-18 NOTE — Assessment & Plan Note (Signed)
Elevated today.  Reviewed list of meds, patient is not taking OTC meds that could be causing it.,  Adding amlodipine 5 mg daily prn bp > 150

## 2013-05-30 ENCOUNTER — Ambulatory Visit: Payer: Self-pay | Admitting: Family Medicine

## 2013-05-30 LAB — RAPID STREP-A WITH REFLX: Micro Text Report: NEGATIVE

## 2013-06-01 LAB — BETA STREP CULTURE(ARMC)

## 2013-07-05 ENCOUNTER — Other Ambulatory Visit: Payer: Self-pay | Admitting: Internal Medicine

## 2013-07-05 MED ORDER — HYDROCODONE-ACETAMINOPHEN 10-325 MG PO TABS: 1.0000 | ORAL_TABLET | Freq: Three times a day (TID) | ORAL | Status: DC | PRN

## 2013-07-05 NOTE — Telephone Encounter (Signed)
Ok to refill,  printed rx  

## 2013-07-05 NOTE — Telephone Encounter (Signed)
Last visit 05/17/13, ok refill?

## 2013-08-18 ENCOUNTER — Other Ambulatory Visit: Payer: Self-pay | Admitting: Internal Medicine

## 2013-08-30 ENCOUNTER — Encounter: Payer: Self-pay | Admitting: Internal Medicine

## 2013-08-31 ENCOUNTER — Other Ambulatory Visit: Payer: Self-pay | Admitting: Internal Medicine

## 2013-09-01 MED ORDER — HYDROCODONE-ACETAMINOPHEN 10-325 MG PO TABS: 1.0000 | ORAL_TABLET | Freq: Three times a day (TID) | ORAL | Status: DC | PRN

## 2013-09-01 NOTE — Telephone Encounter (Signed)
Patient notified script placed up front. 

## 2013-09-09 ENCOUNTER — Other Ambulatory Visit: Payer: Self-pay | Admitting: Internal Medicine

## 2013-09-09 MED ORDER — VALSARTAN 160 MG PO TABS: 160.0000 mg | ORAL_TABLET | Freq: Every day | ORAL | Status: DC

## 2013-09-30 ENCOUNTER — Other Ambulatory Visit: Payer: Self-pay | Admitting: Internal Medicine

## 2013-10-20 ENCOUNTER — Other Ambulatory Visit: Payer: Self-pay | Admitting: Internal Medicine

## 2013-10-21 MED ORDER — HYDROCODONE-ACETAMINOPHEN 10-325 MG PO TABS: 1.0000 | ORAL_TABLET | Freq: Three times a day (TID) | ORAL | Status: DC | PRN

## 2013-10-21 MED ORDER — ETODOLAC 500 MG PO TABS: 500.0000 mg | ORAL_TABLET | Freq: Two times a day (BID) | ORAL | Status: DC

## 2013-10-21 NOTE — Telephone Encounter (Signed)
Last visit 05/17/13, see mychart message comment

## 2013-10-21 NOTE — Telephone Encounter (Signed)
Rx left up front for pickup, pt was notified via mychart

## 2013-11-23 ENCOUNTER — Ambulatory Visit: Payer: Self-pay | Admitting: General Practice

## 2013-11-24 ENCOUNTER — Encounter: Payer: Self-pay | Admitting: *Deleted

## 2013-11-26 NOTE — Telephone Encounter (Signed)
Mailed unread message to pt  

## 2013-12-03 ENCOUNTER — Encounter: Payer: Self-pay | Admitting: *Deleted

## 2013-12-03 NOTE — Progress Notes (Signed)
Chart reviewed for DM bundle. Mychart message was sent to pt. Appt sch 12/08/13, lab appt sch 12/06/13

## 2013-12-06 ENCOUNTER — Other Ambulatory Visit (INDEPENDENT_AMBULATORY_CARE_PROVIDER_SITE_OTHER): Payer: 59

## 2013-12-06 ENCOUNTER — Telehealth: Payer: Self-pay | Admitting: *Deleted

## 2013-12-06 DIAGNOSIS — R739 Hyperglycemia, unspecified: Secondary | ICD-10-CM

## 2013-12-06 DIAGNOSIS — R5383 Other fatigue: Secondary | ICD-10-CM

## 2013-12-06 DIAGNOSIS — R5381 Other malaise: Secondary | ICD-10-CM

## 2013-12-06 DIAGNOSIS — R7309 Other abnormal glucose: Secondary | ICD-10-CM

## 2013-12-06 DIAGNOSIS — E785 Hyperlipidemia, unspecified: Secondary | ICD-10-CM

## 2013-12-06 LAB — COMPREHENSIVE METABOLIC PANEL
ALT: 16 U/L (ref 0–35)
AST: 20 U/L (ref 0–37)
Albumin: 4.2 g/dL (ref 3.5–5.2)
Alkaline Phosphatase: 53 U/L (ref 39–117)
BUN: 20 mg/dL (ref 6–23)
CO2: 33 mEq/L — ABNORMAL HIGH (ref 19–32)
Calcium: 9.5 mg/dL (ref 8.4–10.5)
Chloride: 100 mEq/L (ref 96–112)
Creatinine, Ser: 0.8 mg/dL (ref 0.4–1.2)
GFR: 76.96 mL/min (ref >60.00)
Glucose, Bld: 117 mg/dL — ABNORMAL HIGH (ref 70–99)
Potassium: 4.4 mEq/L (ref 3.5–5.1)
Sodium: 140 mEq/L (ref 135–145)
Total Bilirubin: 0.6 mg/dL (ref 0.2–1.2)
Total Protein: 6.9 g/dL (ref 6.0–8.3)

## 2013-12-06 LAB — LIPID PANEL
Cholesterol: 186 mg/dL (ref 0–200)
HDL: 49 mg/dL (ref >39.00)
LDL Cholesterol: 117 mg/dL — ABNORMAL HIGH (ref 0–99)
NonHDL: 137
Total CHOL/HDL Ratio: 4
Triglycerides: 99 mg/dL (ref 0.0–149.0)
VLDL: 19.8 mg/dL (ref 0.0–40.0)

## 2013-12-06 LAB — TSH: TSH: 2.56 u[IU]/mL (ref 0.35–4.50)

## 2013-12-06 LAB — HEMOGLOBIN A1C: Hgb A1c MFr Bld: 6.2 % (ref 4.6–6.5)

## 2013-12-06 NOTE — Telephone Encounter (Signed)
What labs and dx?  

## 2013-12-08 ENCOUNTER — Encounter: Payer: Self-pay | Admitting: Internal Medicine

## 2013-12-08 ENCOUNTER — Ambulatory Visit (INDEPENDENT_AMBULATORY_CARE_PROVIDER_SITE_OTHER): Payer: 59 | Admitting: Internal Medicine

## 2013-12-08 VITALS — BP 124/78 | HR 71 | Temp 98.3°F | Resp 16 | Ht 66.5 in | Wt 178.5 lb

## 2013-12-08 DIAGNOSIS — I10 Essential (primary) hypertension: Secondary | ICD-10-CM

## 2013-12-08 DIAGNOSIS — E669 Obesity, unspecified: Secondary | ICD-10-CM

## 2013-12-08 DIAGNOSIS — E785 Hyperlipidemia, unspecified: Secondary | ICD-10-CM

## 2013-12-08 DIAGNOSIS — E1169 Type 2 diabetes mellitus with other specified complication: Secondary | ICD-10-CM

## 2013-12-08 DIAGNOSIS — E119 Type 2 diabetes mellitus without complications: Secondary | ICD-10-CM

## 2013-12-08 DIAGNOSIS — M489 Spondylopathy, unspecified: Secondary | ICD-10-CM

## 2013-12-08 DIAGNOSIS — M539 Dorsopathy, unspecified: Secondary | ICD-10-CM

## 2013-12-08 MED ORDER — HYDROCODONE-ACETAMINOPHEN 10-325 MG PO TABS: 1.0000 | ORAL_TABLET | Freq: Three times a day (TID) | ORAL | Status: DC | PRN

## 2013-12-08 MED ORDER — VALSARTAN 160 MG PO TABS: 160.0000 mg | ORAL_TABLET | Freq: Every day | ORAL | Status: DC

## 2013-12-08 MED ORDER — TRAMADOL HCL 50 MG PO TABS: ORAL_TABLET | ORAL | Status: DC

## 2013-12-08 MED ORDER — ALPRAZOLAM 0.25 MG PO TABS: 0.2500 mg | ORAL_TABLET | Freq: Two times a day (BID) | ORAL | Status: DC | PRN

## 2013-12-08 MED ORDER — CELECOXIB 200 MG PO CAPS: 200.0000 mg | ORAL_CAPSULE | Freq: Two times a day (BID) | ORAL | Status: DC

## 2013-12-08 NOTE — Progress Notes (Signed)
Patient ID: Courtney Lopez, female   DOB: 04/09/1961, 53 y.o.   MRN: [id number]  Patient Active Problem List   Diagnosis Date Noted  . Diabetes mellitus type 2 in obese 03/25/2012  . Aortic stenosis   . Screening for colon cancer 08/25/2011  . Screening for breast cancer 08/25/2011  . Obesity (BMI 30-39.9) 08/25/2011  . Hyperlipidemia with target LDL less than 100 08/25/2011  . Cervical spine disease 06/18/2011  . Hypertension 06/18/2011    Subjective:  CC:   Chief Complaint  Patient presents with  . Follow-up    labs    HPI:   Courtney Lopez is a 53 y.o. female who presents for Follow up on chronic issues including Type 2 DM with obesity,  Neck pain   1) Her control of DM has improved  And her weight is stable.   2) Her neck pain has been worse for the past 2 months. She cannot lie on her  left side because she gets a sharp pain that starts in her neck and radiates into her left shoulder and bicep and throbs.  She get numbness and tingling in her upper arm .  Stretching makes it worse. She has a grandson  Of 16 months who weighs 30 lbs  And she picks him up   She has moderately severe foraminal stenosis  c5-c6 on left by 2013 MRI.  She has tried adding  Lodine, but notes when she uses it  daily it is causing stomach upset    Past Medical History  Diagnosis Date  . Metatarsal fracture 2007    5th, from wearing high heels  . Unspecified essential hypertension   . Other symptoms involving abdomen and pelvis(789.9)   . Cervicalgia   . Breast screening, unspecified   . Retinal hemorrhage   . Symptomatic menopausal or female climacteric states   . Aortic valve disorders   . Aortic stenosis     Past Surgical History  Procedure Laterality Date  . Gyn surgery      hysterectomy- done for adenomysosis and endometriosis  . Oophorectomy      unilateral, due to ruptured ovarian cysts  . Osteotomy maxillary      due to bite problems  . Appendectomy      done  during oophorectomy  . Cesarean section      x 2  . Tubal ligation    . Abdominal hysterectomy         The following portions of the patient's history were reviewed and updated as appropriate: Allergies, current medications, and problem list.    Review of Systems:   Patient denies headache, fevers, malaise, unintentional weight loss, skin rash, eye pain, sinus congestion and sinus pain, sore throat, dysphagia,  hemoptysis , cough, dyspnea, wheezing, chest pain, palpitations, orthopnea, edema, abdominal pain, nausea, melena, diarrhea, constipation, flank pain, dysuria, hematuria, urinary  Frequency, nocturia, numbness, tingling, seizures,  Focal weakness, Loss of consciousness,  Tremor, insomnia, depression, anxiety, and suicidal ideation.     History   Social History  . Marital Status: Married    Spouse Name: N/A    Number of Children: 3  . Years of Education: N/A   Occupational History  . Not on file.   Social History Main Topics  . Smoking status: Never Smoker   . Smokeless tobacco: Never Used  . Alcohol Use: No  . Drug Use: No  . Sexual Activity: Not on file   Other Topics Concern  . Not on  file   Social History Narrative   Lives with spouse.    Objective:  Filed Vitals:   12/08/13 1401  BP: 124/78  Pulse: 71  Temp: 98.3 F (36.8 C)  Resp: 16     General appearance: alert, cooperative and appears stated age Ears: normal TM's and external ear canals both ears Throat: lips, mucosa, and tongue normal; teeth and gums normal Neck: no adenopathy, no carotid bruit, supple, symmetrical, trachea midline and thyroid not enlarged, symmetric, no tenderness/mass/nodules Back: symmetric, no curvature. ROM normal. No CVA tenderness. Lungs: clear to auscultation bilaterally Heart: regular rate and rhythm, S1, S2 normal, no murmur, click, rub or gallop Abdomen: soft, non-tender; bowel sounds normal; no masses,  no organomegaly Pulses: 2+ and symmetric Skin: Skin  color, texture, turgor normal. No rashes or lesions Lymph nodes: Cervical, supraclavicular, and axillary nodes normal. Foot exam:  Nails are well trimmed,  No callouses,  Sensation intact to microfilament  Assessment and Plan:  Hypertension Well controlled on current regimen. Renal function stable, stopping hctz today.  Lab Results  Component Value Date   CREATININE 0.8 12/06/2013   Lab Results  Component Value Date   NA 140 12/06/2013   K 4.4 12/06/2013   CL 100 12/06/2013   CO2 33* 12/06/2013     Diabetes mellitus type 2 in obese Improving control with low GI diet and exercise .  hemoglobin A1c at goal. . Patient is referred for  eye exam and foot exam was done today.  There is  no proteinuria onmicro urinalysis .  Fasting lipids are close to gaolwill be repeated  and statin therapy advised if indicated by application of new ACC guidelines based on patient's 10 year risk of CAD .  Lab Results  Component Value Date   HGBA1C 6.2 12/06/2013    Lab Results  Component Value Date   CHOL 186 12/06/2013   HDL 49.00 12/06/2013   LDLCALC 117* 12/06/2013   TRIG 99.0 12/06/2013   CHOLHDL 4 12/06/2013      Cervical spine disease Worsening symptoms with radicular pain. Adding celebrex trial. Discussed repeating MRI cervical spine and referral to Pain Clinic for consideration of epidural injections  Obesity (BMI 30-39.9) She has reduced her BMI with low GI diet but weight has plateaued. Recommended adding exercise.   Hyperlipidemia with target LDL less than 100 Her LDL has increased due to her increased intake of animal proteins on low GI diet  Diet reviewed and  Changes suggested.    Updated Medication List Outpatient Encounter Prescriptions as of 12/08/2013  Medication Sig  . ALPRAZolam (XANAX) 0.25 MG tablet Take 1 tablet (0.25 mg total) by mouth 2 (two) times daily as needed.  [name]. aspirin 81 MG tablet Take 81 mg by mouth daily.  . cyclobenzaprine (FLEXERIL) 5 MG tablet Take 1 tablet  (5 mg total) by mouth 3 (three) times daily as needed.  [name]. ESTRACE VAGINAL 0.1 MG/GM vaginal cream Insert 1/4 applicatorful vaginally 2 (two) times a week as directed.  . etodolac (LODINE) 500 MG tablet Take 1 tablet (500 mg total) by mouth 2 (two) times daily.  [name]. HYDROcodone-acetaminophen (NORCO) 10-325 MG per tablet Take 1 tablet by mouth every 8 (eight) hours as needed.  . labetalol (NORMODYNE) 200 MG tablet TAKE ONE TABLET BY MOUTH 2 TIMES A DAY  . Menthol, Topical Analgesic, (BIOFREEZE EX) Apply topically.  . traMADol (ULTRAM) 50 MG tablet Take 2 tablets by mouth every 6 hours as needed for pain.  . valsartan (  DIOVAN) 160 MG tablet Take 1 tablet (160 mg total) by mouth daily.  . [DISCONTINUED] ALPRAZolam (XANAX) 0.25 MG tablet Take 1 tablet (0.25 mg total) by mouth 2 (two) times daily as needed.  . [DISCONTINUED] hydrochlorothiazide (HYDRODIURIL) 25 MG tablet Take 1 tablet by mouth daily.  . [DISCONTINUED] HYDROcodone-acetaminophen (NORCO) 10-325 MG per tablet Take 1 tablet by mouth every 8 (eight) hours as needed.  . [DISCONTINUED] traMADol (ULTRAM) 50 MG tablet Take 2 tablets by mouth every 6 hours as needed for pain.  . [DISCONTINUED] valsartan (DIOVAN) 160 MG tablet Take 1 tablet (160 mg total) by mouth daily.  [name] amLODipine (NORVASC) 5 MG tablet Take 1 tablet (5 mg total) by mouth daily.  . celecoxib (CELEBREX) 200 MG capsule Take 1 capsule (200 mg total) by mouth 2 (two) times daily.     Orders Placed This Encounter  Procedures  . MR Cervical Spine Wo Contrast  . Ambulatory referral to Pain Clinic    No Follow-up on file.

## 2013-12-08 NOTE — Patient Instructions (Addendum)
Agree with reducing or stopping hctz followed  By the valsartan (or reducing dose) Goal 130/80    trial of celebrex 200 mg twice daily instead of lodine   MRI cervical spine and Pain clinic referral in process  Your diabetes remains under excellent control currently. .Please return in 3 months for follow up on diabetes and make sure you are seeing your eye doctor at least once a year.   However:    Since your LDL increased a little (goal is < 100),  Try to snack more on nonmeat snacks like [employer] pita bread toasted with hummous Mini sweet peppers with pimento cheese dip Nuts!

## 2013-12-08 NOTE — Progress Notes (Signed)
Pre-visit discussion using our clinic review tool. No additional management support is needed unless otherwise documented below in the visit note.  

## 2013-12-10 NOTE — Assessment & Plan Note (Addendum)
Well controlled on current regimen. Renal function stable, stopping hctz today.  Lab Results  Component Value Date   CREATININE 0.8 12/06/2013   Lab Results  Component Value Date   NA 140 12/06/2013   K 4.4 12/06/2013   CL 100 12/06/2013   CO2 33* 12/06/2013

## 2013-12-11 NOTE — Assessment & Plan Note (Signed)
She has reduced her BMI with low GI diet but weight has plateaued. Recommended adding exercise.

## 2013-12-11 NOTE — Assessment & Plan Note (Signed)
Worsening symptoms with radicular pain. Adding celebrex trial. Discussed repeating MRI cervical spine and referral to Pain Clinic for consideration of epidural injections

## 2013-12-11 NOTE — Assessment & Plan Note (Signed)
Improving control with low GI diet and exercise .  hemoglobin A1c at goal. . Patient is referred for  eye exam and foot exam was done today.  There is  no proteinuria onmicro urinalysis .  Fasting lipids are close to gaolwill be repeated  and statin therapy advised if indicated by application of new ACC guidelines based on patient's 10 year risk of CAD .  Lab Results  Component Value Date   HGBA1C 6.2 12/06/2013    Lab Results  Component Value Date   CHOL 186 12/06/2013   HDL 49.00 12/06/2013   LDLCALC 117* 12/06/2013   TRIG 99.0 12/06/2013   CHOLHDL 4 12/06/2013

## 2013-12-11 NOTE — Assessment & Plan Note (Signed)
Her LDL has increased due to her increased intake of animal proteins on low GI diet  Diet reviewed and  Changes suggested.

## 2014-01-11 ENCOUNTER — Encounter: Payer: Self-pay | Admitting: Internal Medicine

## 2014-01-31 ENCOUNTER — Ambulatory Visit: Payer: Self-pay | Admitting: Internal Medicine

## 2014-02-02 ENCOUNTER — Other Ambulatory Visit: Payer: Self-pay | Admitting: Internal Medicine

## 2014-02-03 ENCOUNTER — Other Ambulatory Visit: Payer: Self-pay | Admitting: Internal Medicine

## 2014-02-03 MED ORDER — HYDROCODONE-ACETAMINOPHEN 10-325 MG PO TABS: 1.0000 | ORAL_TABLET | Freq: Three times a day (TID) | ORAL | Status: DC | PRN

## 2014-02-22 ENCOUNTER — Other Ambulatory Visit: Payer: Self-pay | Admitting: Internal Medicine

## 2014-02-28 ENCOUNTER — Encounter: Payer: Self-pay | Admitting: Internal Medicine

## 2014-03-16 ENCOUNTER — Other Ambulatory Visit: Payer: Self-pay | Admitting: Internal Medicine

## 2014-03-16 DIAGNOSIS — M4722 Other spondylosis with radiculopathy, cervical region: Secondary | ICD-10-CM

## 2014-03-16 MED ORDER — HYDROCODONE-ACETAMINOPHEN 10-325 MG PO TABS: 1.0000 | ORAL_TABLET | Freq: Three times a day (TID) | ORAL | Status: DC | PRN

## 2014-03-30 ENCOUNTER — Other Ambulatory Visit: Payer: Self-pay | Admitting: Internal Medicine

## 2014-03-30 NOTE — Telephone Encounter (Signed)
Last visit 12/08/13, ok refill? 

## 2014-03-30 NOTE — Telephone Encounter (Signed)
Ok to refill,  printed rx  

## 2014-03-30 NOTE — Telephone Encounter (Signed)
Faxed to pharmacy

## 2014-04-13 ENCOUNTER — Other Ambulatory Visit: Payer: Self-pay | Admitting: Internal Medicine

## 2014-04-13 MED ORDER — HYDROCODONE-ACETAMINOPHEN 10-325 MG PO TABS: 1.0000 | ORAL_TABLET | Freq: Three times a day (TID) | ORAL | Status: DC | PRN

## 2014-04-13 NOTE — Telephone Encounter (Signed)
Patient requesting Rx refill for Hydrocodone -acetaminophen. Medication last filled 03/16/14 and last office visit was 12/08/13. Please advise.

## 2014-04-13 NOTE — Telephone Encounter (Signed)
Ok to refill,  printed rx  

## 2014-04-14 NOTE — Telephone Encounter (Signed)
Patient notified by RN that Rx was ready for pick-up.

## 2014-05-17 ENCOUNTER — Other Ambulatory Visit: Payer: Self-pay | Admitting: Internal Medicine

## 2014-05-17 MED ORDER — HYDROCODONE-ACETAMINOPHEN 10-325 MG PO TABS: 1.0000 | ORAL_TABLET | Freq: Three times a day (TID) | ORAL | Status: DC | PRN

## 2014-05-17 NOTE — Telephone Encounter (Signed)
Rx left up front for pick up

## 2014-05-17 NOTE — Telephone Encounter (Signed)
Ok to refill,  printed rx  

## 2014-05-17 NOTE — Telephone Encounter (Signed)
Last visit 12/08/13. See mychart message

## 2014-05-18 ENCOUNTER — Other Ambulatory Visit: Payer: Self-pay | Admitting: Internal Medicine

## 2014-06-08 ENCOUNTER — Encounter: Payer: Self-pay | Admitting: Internal Medicine

## 2014-06-09 ENCOUNTER — Telehealth: Payer: Self-pay | Admitting: Internal Medicine

## 2014-06-13 ENCOUNTER — Ambulatory Visit (INDEPENDENT_AMBULATORY_CARE_PROVIDER_SITE_OTHER): Payer: 59 | Admitting: Nurse Practitioner

## 2014-06-13 ENCOUNTER — Encounter: Payer: Self-pay | Admitting: Nurse Practitioner

## 2014-06-13 VITALS — BP 136/80 | HR 77 | Temp 98.6°F | Resp 12 | Ht 66.5 in | Wt 184.0 lb

## 2014-06-13 DIAGNOSIS — Z634 Disappearance and death of family member: Secondary | ICD-10-CM

## 2014-06-13 DIAGNOSIS — F4321 Adjustment disorder with depressed mood: Secondary | ICD-10-CM | POA: Insufficient documentation

## 2014-06-13 DIAGNOSIS — F4329 Adjustment disorder with other symptoms: Secondary | ICD-10-CM | POA: Insufficient documentation

## 2014-06-13 DIAGNOSIS — F4322 Adjustment disorder with anxiety: Secondary | ICD-10-CM

## 2014-06-13 MED ORDER — AMLODIPINE BESYLATE 5 MG PO TABS: 5.0000 mg | ORAL_TABLET | Freq: Every day | ORAL | Status: DC

## 2014-06-13 MED ORDER — FLUOXETINE HCL 10 MG PO TABS: 10.0000 mg | ORAL_TABLET | Freq: Every day | ORAL | Status: DC

## 2014-06-13 NOTE — Patient Instructions (Signed)
Grief Reaction Grief is a normal response to the death of someone close to you. Feelings of fear, anger, and guilt can affect almost everyone who loses someone they love. Symptoms of depression are also common. These include problems with sleep, loss of appetite, and lack of energy. These grief reaction symptoms often last for weeks to months after a loss. They may also return during special times that remind you of the person you lost, such as an anniversary or birthday. Anxiety, insomnia, irritability, and deep depression may last beyond the period of normal grief. If you experience these feelings for 6 months or longer, you may have clinical depression. Clinical depression requires further medical attention. If you think that you have clinical depression, you should contact your caregiver. If you have a history of depression or a family history of depression, you are at greater risk of clinical depression. You are also at greater risk of developing clinical depression if the loss was traumatic or the loss was of someone with whom you had unresolved issues.  A grief reaction can become complicated by being blocked. This means being unable to cry or express extreme emotions. This may prolong the grieving period and worsen the emotional effects of the loss. Mourning is a natural event in human life. A healthy grief reaction is one that is not blocked. It requires a time of sadness and readjustment. It is very important to share your sorrow and fear with others, especially close friends and family. Professional counselors and clergy can also help you process your grief. Document Released: 04/29/2005 Document Revised: 09/13/2013 Document Reviewed: 01/07/2006 [employer] Patient Information 2015 [employer]. This information is not intended to replace advice given to you by your health care provider. Make sure you discuss any questions you have with your health care provider.  

## 2014-06-13 NOTE — Progress Notes (Signed)
Pre visit review using our clinic review tool, if applicable. No additional management support is needed unless otherwise documented below in the visit note. 

## 2014-06-13 NOTE — Progress Notes (Signed)
Subjective:    Patient ID: Courtney Lopez, female    DOB: 02/06/1961, 54 y.o.   MRN: [id number]  HPI  [name] is a 54 yo female with a CC of anxiety and complicated grieving after multiple losses.   1) Lost aunts/uncles/mother/father- years ago/nephew   Panic attacks- Heart racing, chest tight, tight in throat, crying on 06/02/14, having 1 each day, waking up with some and this may feed into sleeping poorly.  Scared to go anywhere because fear of having one EAP- Heloise OchoaCathy Clayton, helping pt with weekly therapy; able to talk self out of a panic attack, breathing techniques, taking time for self   [facility]   Pt wants to figure out why this is different and she is currently grieving differently than in past. Realizes loss of both parents, feels lost and like she is not a daughter any more.   Great support system. 2 weddings last year within 4 months- lots of stress. 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 Gained weight recently and eating more comfort foods due to grief.    Wt Readings from Last 3 Encounters:  06/13/14 184 lb (83.462 kg)  12/08/13 178 lb 8 oz (80.967 kg)  05/17/13 179 lb 4 oz (81.307 kg)      Review of Systems  Constitutional: Negative for fever, chills, diaphoresis and fatigue.  Respiratory: Negative for chest tightness, shortness of breath and wheezing.   Cardiovascular: Negative for chest pain, palpitations and leg swelling.  Gastrointestinal: Negative for nausea, vomiting, abdominal pain, diarrhea and abdominal distention.  Skin: Negative for rash.  Neurological: Negative for dizziness, weakness, numbness and headaches.  Psychiatric/Behavioral: Positive for sleep disturbance and decreased concentration. Negative for suicidal ideas, hallucinations, confusion and self-injury. The patient is nervous/anxious. The patient is not hyperactive.    Past Medical History  Diagnosis  Date  . Metatarsal fracture 2007    5th, from wearing high heels  . Unspecified essential hypertension   . Other symptoms involving abdomen and pelvis(789.9)   . Cervicalgia   . Breast screening, unspecified   . Retinal hemorrhage   . Symptomatic menopausal or female climacteric states   . Aortic valve disorders   . Aortic stenosis     History   Social History  . Marital Status: Married    Spouse Name: N/A    Number of Children: 3  . Years of Education: N/A   Occupational History  . Not on file.   Social History Main Topics  . Smoking status: Never Smoker   . Smokeless tobacco: Never Used  . Alcohol Use: No  . Drug Use: No  . Sexual Activity: Not on file   Other Topics Concern  . Not on file   Social History Narrative   Lives with spouse.    Past Surgical History  Procedure Laterality Date  . Gyn surgery      hysterectomy- done for adenomysosis and endometriosis  . Oophorectomy      unilateral, due to ruptured ovarian cysts  . Osteotomy maxillary      due to bite problems  . Appendectomy      done during oophorectomy  . Cesarean section      x 2  . Tubal ligation    . Abdominal hysterectomy      Family History  Problem Relation Age of Onset  . Diabetes Mother   . Retinoblastoma Son   .  Melanoma Son   . Cancer Father     leukemia  . Cancer Maternal Aunt     breast    No Known Allergies  Current Outpatient Prescriptions on File Prior to Visit  Medication Sig Dispense Refill  . ALPRAZolam (XANAX) 0.25 MG tablet TAKE 1 TABLET BY MOUTH TWICE DAILY AS NEEDED 60 tablet 3  . aspirin 81 MG tablet Take 81 mg by mouth daily.    . cyclobenzaprine (FLEXERIL) 5 MG tablet Take 1 tablet (5 mg total) by mouth 3 (three) times daily as needed. 90 tablet 3  . ESTRACE VAGINAL 0.1 MG/GM vaginal cream INSERT 1/4 APPLICATORFUL VAGINALLY 2 (TWO) TIMES A WEEK AS DIRECTED. 42.5 g 5  . etodolac (LODINE) 500 MG tablet Take 1 tablet (500 mg total) by mouth 2 (two) times  daily. 60 tablet 3  . hydrochlorothiazide (HYDRODIURIL) 25 MG tablet TAKE 1 TABLET BY MOUTH DAILY. 90 tablet 1  . HYDROcodone-acetaminophen (NORCO) 10-325 MG per tablet Take 1 tablet by mouth every 8 (eight) hours as needed. 90 tablet 0  . labetalol (NORMODYNE) 200 MG tablet TAKE ONE TABLET BY MOUTH 2 TIMES A DAY 180 tablet 1  . Menthol, Topical Analgesic, (BIOFREEZE EX) Apply topically.    . traMADol (ULTRAM) 50 MG tablet Take 2 tablets by mouth every 6 hours as needed for pain. 240 tablet 4  . valsartan (DIOVAN) 160 MG tablet TAKE 1 TABLET BY MOUTH ONCE DAILY 90 tablet 1   No current facility-administered medications on file prior to visit.       Objective:   Physical Exam  Constitutional: She is oriented to person, place, and time. She appears well-developed and well-nourished. No distress.  HENT:  Head: Normocephalic and atraumatic.  Right Ear: External ear normal.  Left Ear: External ear normal.  Eyes: Conjunctivae and EOM are normal. Pupils are equal, round, and reactive to light. Right eye exhibits no discharge. Left eye exhibits no discharge. No scleral icterus.  Neck: Normal range of motion. Neck supple.  Cardiovascular: Normal rate, regular rhythm, normal heart sounds and intact distal pulses.  Exam reveals no gallop and no friction rub.   No murmur heard. Pulmonary/Chest: Effort normal and breath sounds normal. No respiratory distress. She has no wheezes. She has no rales. She exhibits no tenderness.  Neurological: She is alert and oriented to person, place, and time. No cranial nerve deficit. She exhibits normal muscle tone. Coordination normal.  Skin: Skin is warm and dry. No rash noted. She is not diaphoretic.  Psychiatric: Her behavior is normal. Judgment normal. Her mood appears anxious. Her affect is not angry, not blunt, not labile and not inappropriate. Her speech is not rapid and/or pressured, not delayed, not tangential and not slurred. She is not agitated, not  aggressive, not hyperactive, not slowed, not withdrawn, not actively hallucinating and not combative. Thought content is not paranoid and not delusional. Cognition and memory are not impaired. She does not express impulsivity or inappropriate judgment. She exhibits a depressed mood. She expresses homicidal ideation. She expresses no suicidal ideation. She expresses no suicidal plans and no homicidal plans. She is communicative. She exhibits normal recent memory and normal remote memory. She is attentive.      Assessment & Plan:

## 2014-06-13 NOTE — Assessment & Plan Note (Signed)
Stable. Pt working with therapy weekly. She is having trouble at her job due because she deals with a lot of illness and sometimes death. Adding prozac 10 mg daily to her regimen. Continue Xanax 0.25 mg as needed for panic attacks. FU in 6 weeks.

## 2014-06-13 NOTE — Assessment & Plan Note (Signed)
Stable. Pt is having a rough time grieving over loss of mother and several other relatives over the past few years. She feels overwhelmed this time and is seeing a therapist weekly. She has daily panic attacks. See adjustment d/o for medication recommendations. FU 6 weeks.

## 2014-06-14 ENCOUNTER — Telehealth: Payer: Self-pay | Admitting: Internal Medicine

## 2014-06-14 NOTE — Telephone Encounter (Signed)
Left message advising pt that FMLA forms are ready for pick up.msn

## 2014-06-20 NOTE — Telephone Encounter (Signed)
Message sent

## 2014-06-22 ENCOUNTER — Telehealth: Payer: Self-pay | Admitting: Internal Medicine

## 2014-06-22 MED ORDER — HYDROCODONE-ACETAMINOPHEN 10-325 MG PO TABS
1.0000 | ORAL_TABLET | Freq: Three times a day (TID) | ORAL | Status: DC | PRN
Start: 2014-06-22 — End: 2014-08-11

## 2014-06-22 NOTE — Telephone Encounter (Signed)
Ok to refill,  printed rx  

## 2014-06-23 NOTE — Telephone Encounter (Signed)
Left message on VM that Rx ready for pick up 

## 2014-07-25 ENCOUNTER — Other Ambulatory Visit: Payer: Self-pay | Admitting: Internal Medicine

## 2014-07-25 NOTE — Telephone Encounter (Signed)
Last visit 06/13/14 with Lyla Sonarrie

## 2014-07-26 NOTE — Telephone Encounter (Signed)
Faxed to pharmacy

## 2014-07-26 NOTE — Telephone Encounter (Signed)
Ok to refill,  printed rx  

## 2014-08-11 ENCOUNTER — Telehealth: Payer: Self-pay | Admitting: Internal Medicine

## 2014-08-11 MED ORDER — HYDROCODONE-ACETAMINOPHEN 10-325 MG PO TABS: 1.0000 | ORAL_TABLET | Freq: Three times a day (TID) | ORAL | Status: DC | PRN

## 2014-08-11 NOTE — Telephone Encounter (Signed)
Ok to refill,  printed rx  

## 2014-08-11 NOTE — Telephone Encounter (Signed)
Rx awaiting MDs signature.  Left message advising may pick up tomorrow 4.1.16

## 2014-08-16 ENCOUNTER — Encounter: Payer: Self-pay | Admitting: *Deleted

## 2014-08-23 ENCOUNTER — Other Ambulatory Visit: Payer: Self-pay | Admitting: Internal Medicine

## 2014-09-16 LAB — HM DIABETES EYE EXAM

## 2014-09-19 ENCOUNTER — Encounter: Payer: Self-pay | Admitting: *Deleted

## 2014-09-22 ENCOUNTER — Other Ambulatory Visit: Payer: Self-pay | Admitting: Internal Medicine

## 2014-09-23 MED ORDER — HYDROCODONE-ACETAMINOPHEN 10-325 MG PO TABS: 1.0000 | ORAL_TABLET | Freq: Three times a day (TID) | ORAL | Status: DC | PRN

## 2014-09-23 NOTE — Telephone Encounter (Signed)
Ok to refill,  printed rx  

## 2014-10-12 ENCOUNTER — Other Ambulatory Visit: Payer: Self-pay | Admitting: Internal Medicine

## 2014-10-12 NOTE — Telephone Encounter (Signed)
Appt 10/17/14

## 2014-10-17 ENCOUNTER — Ambulatory Visit: Payer: Self-pay | Admitting: Internal Medicine

## 2014-10-25 ENCOUNTER — Telehealth: Payer: Self-pay | Admitting: *Deleted

## 2014-10-25 ENCOUNTER — Other Ambulatory Visit (INDEPENDENT_AMBULATORY_CARE_PROVIDER_SITE_OTHER): Payer: 59

## 2014-10-25 DIAGNOSIS — E785 Hyperlipidemia, unspecified: Secondary | ICD-10-CM | POA: Diagnosis not present

## 2014-10-25 DIAGNOSIS — E119 Type 2 diabetes mellitus without complications: Secondary | ICD-10-CM

## 2014-10-25 DIAGNOSIS — E1169 Type 2 diabetes mellitus with other specified complication: Secondary | ICD-10-CM

## 2014-10-25 DIAGNOSIS — E669 Obesity, unspecified: Secondary | ICD-10-CM | POA: Diagnosis not present

## 2014-10-25 DIAGNOSIS — M438X2 Other specified deforming dorsopathies, cervical region: Secondary | ICD-10-CM

## 2014-10-25 DIAGNOSIS — M489 Spondylopathy, unspecified: Secondary | ICD-10-CM

## 2014-10-25 DIAGNOSIS — I10 Essential (primary) hypertension: Secondary | ICD-10-CM

## 2014-10-25 LAB — COMPREHENSIVE METABOLIC PANEL
ALT: 18 U/L (ref 0–35)
AST: 24 U/L (ref 0–37)
Albumin: 4.6 g/dL (ref 3.5–5.2)
Alkaline Phosphatase: 53 U/L (ref 39–117)
BUN: 16 mg/dL (ref 6–23)
CO2: 32 mEq/L (ref 19–32)
Calcium: 9.4 mg/dL (ref 8.4–10.5)
Chloride: 100 mEq/L (ref 96–112)
Creatinine, Ser: 0.77 mg/dL (ref 0.40–1.20)
GFR: 82.48 mL/min (ref >60.00)
Glucose, Bld: 120 mg/dL — ABNORMAL HIGH (ref 70–99)
Potassium: 4.2 mEq/L (ref 3.5–5.1)
Sodium: 139 mEq/L (ref 135–145)
Total Bilirubin: 0.7 mg/dL (ref 0.2–1.2)
Total Protein: 6.8 g/dL (ref 6.0–8.3)

## 2014-10-25 LAB — LIPID PANEL
Cholesterol: 186 mg/dL (ref 0–200)
HDL: 50.1 mg/dL (ref >39.00)
LDL Cholesterol: 108 mg/dL — ABNORMAL HIGH (ref 0–99)
NonHDL: 135.9
Total CHOL/HDL Ratio: 4
Triglycerides: 138 mg/dL (ref 0.0–149.0)
VLDL: 27.6 mg/dL (ref 0.0–40.0)

## 2014-10-25 LAB — HEMOGLOBIN A1C: Hgb A1c MFr Bld: 6.5 % (ref 4.6–6.5)

## 2014-10-25 NOTE — Telephone Encounter (Signed)
Courtney Lopez,  We are missing a lot of urine micro alb/cr ratios on patients with diabetes because their labs are not put in in advance.  Can you start collecting urine on patinets if they have diabetes?  Otherwise they will have to return

## 2014-10-25 NOTE — Telephone Encounter (Signed)
Ok

## 2014-10-25 NOTE — Telephone Encounter (Signed)
Labs and dx?  

## 2014-10-27 ENCOUNTER — Encounter: Payer: Self-pay | Admitting: Internal Medicine

## 2014-10-27 ENCOUNTER — Ambulatory Visit (INDEPENDENT_AMBULATORY_CARE_PROVIDER_SITE_OTHER): Payer: 59 | Admitting: Internal Medicine

## 2014-10-27 VITALS — BP 138/96 | HR 89 | Temp 98.7°F | Resp 14 | Ht 67.0 in | Wt 190.0 lb

## 2014-10-27 DIAGNOSIS — I350 Nonrheumatic aortic (valve) stenosis: Secondary | ICD-10-CM | POA: Diagnosis not present

## 2014-10-27 DIAGNOSIS — Z1211 Encounter for screening for malignant neoplasm of colon: Secondary | ICD-10-CM

## 2014-10-27 DIAGNOSIS — E119 Type 2 diabetes mellitus without complications: Secondary | ICD-10-CM

## 2014-10-27 DIAGNOSIS — I10 Essential (primary) hypertension: Secondary | ICD-10-CM | POA: Diagnosis not present

## 2014-10-27 DIAGNOSIS — E669 Obesity, unspecified: Secondary | ICD-10-CM

## 2014-10-27 DIAGNOSIS — E1169 Type 2 diabetes mellitus with other specified complication: Secondary | ICD-10-CM

## 2014-10-27 MED ORDER — HYDROCODONE-ACETAMINOPHEN 10-325 MG PO TABS: 1.0000 | ORAL_TABLET | Freq: Three times a day (TID) | ORAL | Status: DC | PRN

## 2014-10-27 NOTE — Progress Notes (Signed)
Pre-visit discussion using our clinic review tool. No additional management support is needed unless otherwise documented below in the visit note.  

## 2014-10-27 NOTE — Patient Instructions (Addendum)
I recommend trying a detox diet of fruits and greens:     Courtney Lopez Green Smoothie 10 day DeTox to jump start your diet with a "clean out"  Available on [facility]   Referral to [facility] is in process

## 2014-10-27 NOTE — Progress Notes (Signed)
Subjective:  Patient ID: Courtney Lopez, female    DOB: [birth year]  Age: 54 y.o. MRN: [id number]  CC: The primary encounter diagnosis was Aortic stenosis. Diagnoses of Screening for colon cancer, Essential hypertension, Diabetes mellitus type 2 in obese, and Obesity (BMI 30-39.9) were also pertinent to this visit.  HPI Alfred Eckley Devoto presents for follow up on Type 2 DM with obesity and hypertension complicated by aortic stenosis and cervical spine disease.   DM:  She checks her bloof sugars infrequently and all have been controlled.  Wt gain of 12 lbs in the last year:  Reviewed her eating habits.  She works 3rd shift at [employer] 3 days per week. ; Wrap with hummous, [place] cheese and bacon .  Salad at work:  2) Elevated blood pressure: home BPs have been normal and/or low  And she has had to reduce dose of hctz  3) Grief:  Did not tolerate prozac .  Grieving is resolved   Outpatient Prescriptions Prior to Visit  Medication Sig Dispense Refill  . ALPRAZolam (XANAX) 0.25 MG tablet TAKE 1 TABLET BY MOUTH TWICE DAILY AS NEEDED 60 tablet 3  . aspirin 81 MG tablet Take 81 mg by mouth daily.    . cyclobenzaprine (FLEXERIL) 5 MG tablet Take 1 tablet (5 mg total) by mouth 3 (three) times daily as needed. 90 tablet 3  . ESTRACE VAGINAL 0.1 MG/GM vaginal cream INSERT 1/4 APPLICATORFUL VAGINALLY 2 (TWO) TIMES A WEEK AS DIRECTED. 42.5 g 5  . etodolac (LODINE) 500 MG tablet Take 1 tablet (500 mg total) by mouth 2 (two) times daily. 60 tablet 3  . hydrochlorothiazide (HYDRODIURIL) 25 MG tablet TAKE 1 TABLET BY MOUTH DAILY. 90 tablet 1  . labetalol (NORMODYNE) 200 MG tablet TAKE ONE TABLET BY MOUTH 2 TIMES A DAY 180 tablet 0  . Menthol, Topical Analgesic, (BIOFREEZE EX) Apply topically.    . traMADol (ULTRAM) 50 MG tablet TAKE 2 TABLETS BY MOUTH EVERY 6 HOURS AS NEEDED FOR PAIN 240 tablet 4  . valsartan (DIOVAN) 160 MG tablet TAKE 1 TABLET BY MOUTH ONCE DAILY 90 tablet 1  .  HYDROcodone-acetaminophen (NORCO) 10-325 MG per tablet Take 1 tablet by mouth every 8 (eight) hours as needed. 90 tablet 0  . amLODipine (NORVASC) 5 MG tablet Take 1 tablet (5 mg total) by mouth daily. (Patient not taking: Reported on 10/27/2014) 90 tablet 3  . FLUoxetine (PROZAC) 10 MG tablet Take 1 tablet (10 mg total) by mouth daily. (Patient not taking: Reported on 10/27/2014) 90 tablet 0   No facility-administered medications prior to visit.    Review of Systems;  Patient denies headache, fevers, malaise, unintentional weight loss, skin rash, eye pain, sinus congestion and sinus pain, sore throat, dysphagia,  hemoptysis , cough, dyspnea, wheezing, chest pain, palpitations, orthopnea, edema, abdominal pain, nausea, melena, diarrhea, constipation, flank pain, dysuria, hematuria, urinary  Frequency, nocturia, numbness, tingling, seizures,  Focal weakness, Loss of consciousness,  Tremor, insomnia, depression, anxiety, and suicidal ideation.      Objective:  BP 138/96 mmHg  Pulse 89  Temp(Src) 98.7 F (37.1 C) (Oral)  Resp 14  Ht  (1.702 m)  Wt 190 lb (86.183 kg)  BMI 29.75 kg/m2  SpO2 99%  BP Readings from Last 3 Encounters:  10/27/14 138/96  06/13/14 136/80  12/08/13 124/78    Wt Readings from Last 3 Encounters:  10/27/14 190 lb (86.183 kg)  06/13/14 184 lb (83.462 kg)  12/08/13 178 lb 8 oz (  80.967 kg)    General appearance: alert, cooperative and appears stated age Ears: normal TM's and external ear canals both ears Throat: lips, mucosa, and tongue normal; teeth and gums normal Neck: no adenopathy, no carotid bruit, supple, symmetrical, trachea midline and thyroid not enlarged, symmetric, no tenderness/mass/nodules Back: symmetric, no curvature. ROM normal. No CVA tenderness. Lungs: clear to auscultation bilaterally Heart: regular rate and rhythm, S1, S2 normal, no murmur, click, rub or gallop Abdomen: soft, non-tender; bowel sounds normal; no masses,  no  organomegaly Pulses: 2+ and symmetric Skin: Skin color, texture, turgor normal. No rashes or lesions Lymph nodes: Cervical, supraclavicular, and axillary nodes normal.  Lab Results  Component Value Date   HGBA1C 6.5 10/25/2014   HGBA1C 6.2 12/06/2013   HGBA1C 7.2* 02/17/2013    Lab Results  Component Value Date   CREATININE 0.77 10/25/2014   CREATININE 0.8 12/06/2013   CREATININE 0.88 02/17/2013    Lab Results  Component Value Date   WBC 8.3 02/17/2013   HGB 13.1 02/17/2013   HCT 37.3 02/17/2013   PLT 291 02/17/2013   GLUCOSE 120* 10/25/2014   CHOL 186 10/25/2014   TRIG 138.0 10/25/2014   HDL 50.10 10/25/2014   LDLCALC 108* 10/25/2014   ALT 18 10/25/2014   AST 24 10/25/2014   NA 139 10/25/2014   K 4.2 10/25/2014   CL 100 10/25/2014   CREATININE 0.77 10/25/2014   BUN 16 10/25/2014   CO2 32 10/25/2014   TSH 2.56 12/06/2013   HGBA1C 6.5 10/25/2014    No results found.  Assessment & Plan:   Problem List Items Addressed This Visit    Hypertension    Well controlled on current regimen. Renal function stable, no changes today.  Lab Results  Component Value Date   CREATININE 0.77 10/25/2014   Lab Results  Component Value Date   NA 139 10/25/2014   K 4.2 10/25/2014   CL 100 10/25/2014   CO2 32 10/25/2014         Obesity (BMI 30-39.9)    I have addressed  BMI and recommended a low glycemic index diet utilizing smaller more frequent meals to increase metabolism.  I have also recommended that patient start exercising with a goal of 30 minutes of aerobic exercise a minimum of 5 days per week.       Diabetes mellitus type 2 in obese    Improving control with low GI diet and exercise .  hemoglobin A1c at goal. . Patient is referred for  eye exam and foot exam was done today.  There is  no proteinuria onmicro urinalysis .  Fasting lipids are close to gaolwill be repeated  and statin therapy advised if indicated by application of new ACC guidelines based on  patient's 10 year risk of CAD .  Lab Results  Component Value Date   HGBA1C 6.5 10/25/2014    Lab Results  Component Value Date   CHOL 186 10/25/2014   HDL 50.10 10/25/2014   LDLCALC 108* 10/25/2014   TRIG 138.0 10/25/2014   CHOLHDL 4 10/25/2014            Aortic stenosis - Primary    She is asymptomatic.  requesting change in cardiology to [place]. For annual follow up      Relevant Orders   Ambulatory referral to Cardiology   Screening for colon cancer     A total of 40 minutes of face to face time was spent with patient more than half of which  was spent in counselling and coordination of care   I have discontinued Ms. Wargo's FLUoxetine. I am also having her maintain her aspirin, (Menthol, Topical Analgesic, (BIOFREEZE EX)), cyclobenzaprine, etodolac, valsartan, ESTRACE VAGINAL, amLODipine, traMADol, ALPRAZolam, hydrochlorothiazide, labetalol, and HYDROcodone-acetaminophen.  Meds ordered this encounter  Medications  . HYDROcodone-acetaminophen (NORCO) 10-325 MG per tablet    Sig: Take 1 tablet by mouth every 8 (eight) hours as needed.    Dispense:  90 tablet    Refill:  0    Medications Discontinued During This Encounter  Medication Reason  . FLUoxetine (PROZAC) 10 MG tablet Patient Preference  . HYDROcodone-acetaminophen (NORCO) 10-325 MG per tablet Reorder    Follow-up: No Follow-up on file.   Sherlene Shams, MD

## 2014-10-29 NOTE — Assessment & Plan Note (Signed)
She is asymptomatic.  requesting change in cardiology to [facility] [place]. For annual follow up

## 2014-10-29 NOTE — Assessment & Plan Note (Signed)
Improving control with low GI diet and exercise .  hemoglobin A1c at goal. . Patient is referred for  eye exam and foot exam was done today.  There is  no proteinuria onmicro urinalysis .  Fasting lipids are close to gaolwill be repeated  and statin therapy advised if indicated by application of new ACC guidelines based on patient's 10 year risk of CAD .  Lab Results  Component Value Date   HGBA1C 6.5 10/25/2014    Lab Results  Component Value Date   CHOL 186 10/25/2014   HDL 50.10 10/25/2014   LDLCALC 108* 10/25/2014   TRIG 138.0 10/25/2014   CHOLHDL 4 10/25/2014

## 2014-10-29 NOTE — Assessment & Plan Note (Signed)
I have addressed  BMI and recommended a low glycemic index diet utilizing smaller more frequent meals to increase metabolism.  I have also recommended that patient start exercising with a goal of 30 minutes of aerobic exercise a minimum of 5 days per week.  

## 2014-10-29 NOTE — Assessment & Plan Note (Signed)
Well controlled on current regimen. Renal function stable, no changes today.  Lab Results  Component Value Date   CREATININE 0.77 10/25/2014   Lab Results  Component Value Date   NA 139 10/25/2014   K 4.2 10/25/2014   CL 100 10/25/2014   CO2 32 10/25/2014

## 2014-11-18 ENCOUNTER — Other Ambulatory Visit: Payer: Self-pay | Admitting: Internal Medicine

## 2014-11-18 NOTE — Telephone Encounter (Signed)
Last OV 6.16.16, last refill 3.15.16.  Please advise refill

## 2014-11-18 NOTE — Telephone Encounter (Signed)
Ok to refill,  printed rx  

## 2014-11-18 NOTE — Telephone Encounter (Signed)
rx faxed

## 2014-12-07 ENCOUNTER — Telehealth: Payer: Self-pay | Admitting: Internal Medicine

## 2014-12-08 MED ORDER — HYDROCODONE-ACETAMINOPHEN 10-325 MG PO TABS: 1.0000 | ORAL_TABLET | Freq: Three times a day (TID) | ORAL | Status: DC | PRN

## 2014-12-08 NOTE — Telephone Encounter (Signed)
Ok to refill,  printed rx  

## 2014-12-08 NOTE — Telephone Encounter (Signed)
Script placed at front desk and [facility] chart message sent to patient.

## 2014-12-08 NOTE — Addendum Note (Signed)
Addended by: Sherlene Shams on: 12/08/2014 01:51 PM   Modules accepted: Orders

## 2014-12-12 ENCOUNTER — Other Ambulatory Visit: Payer: Self-pay | Admitting: Internal Medicine

## 2014-12-16 ENCOUNTER — Ambulatory Visit: Payer: 59 | Admitting: Cardiovascular Disease

## 2015-01-17 ENCOUNTER — Other Ambulatory Visit: Payer: Self-pay | Admitting: Internal Medicine

## 2015-01-23 ENCOUNTER — Telehealth: Payer: Self-pay | Admitting: Internal Medicine

## 2015-01-23 NOTE — Telephone Encounter (Signed)
Please schedule Dr, Adriana Simas 11.am

## 2015-01-23 NOTE — Telephone Encounter (Signed)
Please advise the only appointment I see is 11.30 on Thursday?

## 2015-01-23 NOTE — Telephone Encounter (Signed)
FYI patient could not come Thursday scheduled with Dr. Adriana Simas tomorrow

## 2015-01-23 NOTE — Telephone Encounter (Signed)
Pt called wanting to schedule an appt with Dr. Darrick Huntsman. For diverticulitis.. She stated that she only wanted to see Dr Darrick Huntsman.[name] Please advise Pt. Or where to schedule

## 2015-01-24 ENCOUNTER — Encounter: Payer: Self-pay | Admitting: Family Medicine

## 2015-01-24 ENCOUNTER — Ambulatory Visit (INDEPENDENT_AMBULATORY_CARE_PROVIDER_SITE_OTHER): Payer: 59 | Admitting: Family Medicine

## 2015-01-24 VITALS — BP 130/88 | HR 72 | Temp 98.3°F | Ht 67.0 in | Wt 189.1 lb

## 2015-01-24 DIAGNOSIS — K5792 Diverticulitis of intestine, part unspecified, without perforation or abscess without bleeding: Secondary | ICD-10-CM

## 2015-01-24 MED ORDER — PROMETHAZINE HCL 25 MG PO TABS: 25.0000 mg | ORAL_TABLET | Freq: Three times a day (TID) | ORAL | Status: DC | PRN

## 2015-01-24 MED ORDER — METRONIDAZOLE 500 MG PO TABS: 500.0000 mg | ORAL_TABLET | Freq: Three times a day (TID) | ORAL | Status: DC

## 2015-01-24 MED ORDER — CIPROFLOXACIN HCL 500 MG PO TABS: 500.0000 mg | ORAL_TABLET | Freq: Two times a day (BID) | ORAL | Status: DC

## 2015-01-24 NOTE — Progress Notes (Signed)
Subjective:  Patient ID: Courtney Lopez, female    DOB: 1961-04-07  Age: 54 y.o. MRN: [id number]  CC: Abdominal pain, N/V   HPI:  54 year old female with a history of several abdominal surgeries and diverticulitis presents with the above complaints.  Patient reports that she developed abdominal pain, nausea, and vomiting on Friday. She states that these are her typical symptoms of her prior occurrences of diverticulitis. She states that she made herself NPO and has been using some left upper Phenergan for her nausea. She has been slowly advancing her diet and has been improving. However, she's had persistent pain and felt that she should come in for evaluation. She denies any fever but reports chills. She is not had any vomiting in a few days. She was able to take food by mouth this morning. She reports that she is passing gas but no stool at this time.  Social Hx   Social History   Social History  . Marital Status: Married    Spouse Name: N/A  . Number of Children: 3  . Years of Education: N/A   Social History Main Topics  . Smoking status: Never Smoker   . Smokeless tobacco: Never Used  . Alcohol Use: No  . Drug Use: No  . Sexual Activity: Not Asked   Other Topics Concern  . None   Social History Narrative   Lives with spouse.   Review of Systems  Constitutional: Positive for chills. Negative for fever.  Gastrointestinal: Positive for nausea, vomiting, abdominal pain and abdominal distention.   Objective:  BP 130/88 mmHg  Pulse 72  Temp(Src) 98.3 F (36.8 C) (Oral)  Ht 5\' 7"  (1.702 m)  Wt 189 lb 2 oz (85.787 kg)  BMI 29.61 kg/m2  SpO2 96%  BP/Weight 01/24/2015 10/27/2014 06/13/2014  Systolic BP 130 138 136  Diastolic BP 88 96 80  Wt. (Lbs) 189.13 190 184  BMI 29.61 29.75 29.26   Physical Exam  Constitutional: She is oriented to person, place, and time. She appears well-developed and well-nourished. No distress.  Eyes: No scleral icterus.  Cardiovascular:  Normal rate and regular rhythm.   Murmur heard.  Systolic murmur is present with a grade of 3/6  Pulmonary/Chest: Effort normal and breath sounds normal. No respiratory distress. She has no wheezes. She has no rales.  Abdominal: Soft.  Hypoactive bowel sounds.  Mild distention. Tender to palpation in the RLQ. No guarding or rebound but she is very tender.   Neurological: She is alert and oriented to person, place, and time.  Psychiatric: She has a normal mood and affect.   Lab Results  Component Value Date   WBC 8.3 02/17/2013   HGB 13.1 02/17/2013   HCT 37.3 02/17/2013   PLT 291 02/17/2013   GLUCOSE 120* 10/25/2014   CHOL 186 10/25/2014   TRIG 138.0 10/25/2014   HDL 50.10 10/25/2014   LDLCALC 108* 10/25/2014   ALT 18 10/25/2014   AST 24 10/25/2014   NA 139 10/25/2014   K 4.2 10/25/2014   CL 100 10/25/2014   CREATININE 0.77 10/25/2014   BUN 16 10/25/2014   CO2 32 10/25/2014   TSH 2.56 12/06/2013   HGBA1C 6.5 10/25/2014    Assessment & Plan:   Problem List Items Addressed This Visit    Diverticulitis - Primary    History and exam consistent with diverticulitis. Treating with Cipro/Flagyl and Phenergan as needed for nausea. Discussed low threshold for CT scan and patient declined at this time. Patient  to follow-up if she worsens or fails to improve. Return precautions given         Meds ordered this encounter  Medications  . metroNIDAZOLE (FLAGYL) 500 MG tablet    Sig: Take 1 tablet (500 mg total) by mouth 3 (three) times daily.    Dispense:  21 tablet    Refill:  0  . ciprofloxacin (CIPRO) 500 MG tablet    Sig: Take 1 tablet (500 mg total) by mouth 2 (two) times daily.    Dispense:  20 tablet    Refill:  0  . promethazine (PHENERGAN) 25 MG tablet    Sig: Take 1 tablet (25 mg total) by mouth every 8 (eight) hours as needed for nausea or vomiting.    Dispense:  20 tablet    Refill:  0   Follow-up: PRN  Everlene Other, DO

## 2015-01-24 NOTE — Patient Instructions (Addendum)
It was nice to see you today.  Take the antibiotics as prescribed.   Follow up as needed.  Take care  Dr. Adriana Simas

## 2015-01-24 NOTE — Assessment & Plan Note (Signed)
History and exam consistent with diverticulitis. Treating with Cipro/Flagyl and Phenergan as needed for nausea. Discussed low threshold for CT scan and patient declined at this time. Patient to follow-up if she worsens or fails to improve. Return precautions given

## 2015-01-24 NOTE — Progress Notes (Signed)
Pre visit review using our clinic review tool, if applicable. No additional management support is needed unless otherwise documented below in the visit note. 

## 2015-01-25 ENCOUNTER — Ambulatory Visit: Payer: 59 | Admitting: Family Medicine

## 2015-01-31 ENCOUNTER — Other Ambulatory Visit: Payer: Self-pay | Admitting: Internal Medicine

## 2015-02-01 ENCOUNTER — Ambulatory Visit (INDEPENDENT_AMBULATORY_CARE_PROVIDER_SITE_OTHER): Payer: 59 | Admitting: Cardiovascular Disease

## 2015-02-01 ENCOUNTER — Encounter: Payer: Self-pay | Admitting: Cardiovascular Disease

## 2015-02-01 VITALS — BP 164/100 | HR 67 | Ht 66.5 in | Wt 190.8 lb

## 2015-02-01 DIAGNOSIS — E119 Type 2 diabetes mellitus without complications: Secondary | ICD-10-CM

## 2015-02-01 DIAGNOSIS — I350 Nonrheumatic aortic (valve) stenosis: Secondary | ICD-10-CM | POA: Diagnosis not present

## 2015-02-01 DIAGNOSIS — E785 Hyperlipidemia, unspecified: Secondary | ICD-10-CM | POA: Diagnosis not present

## 2015-02-01 DIAGNOSIS — E669 Obesity, unspecified: Secondary | ICD-10-CM

## 2015-02-01 DIAGNOSIS — I10 Essential (primary) hypertension: Secondary | ICD-10-CM

## 2015-02-01 DIAGNOSIS — E1169 Type 2 diabetes mellitus with other specified complication: Secondary | ICD-10-CM

## 2015-02-01 NOTE — Assessment & Plan Note (Signed)
We have encouraged continued exercise, careful diet management in an effort to lose weight. 

## 2015-02-01 NOTE — Assessment & Plan Note (Signed)
Blood pressure elevated today. She does report it is typically well controlled at home  Recommended if this runs high, that she take additional amlodipine or labetalol for control  Suggested she call our office if systolic pressure continues to run high

## 2015-02-01 NOTE — Progress Notes (Signed)
Patient ID: Courtney Lopez, female    DOB: 1960/11/04, 54 y.o.   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   She reports diagnosis of murmur and aortic valve disease was made as she was pregnant with her third child. Murmur persisted even after delivery. She has been seen by Dr. Juliann Pares for many years with periodic echocardiogram. She does not remember the details of her aortic valve pathology. She has never been told it is bicuspid or tricuspid valve. She does not remember degree of stenosis if any.  In general she is active, kids are older, very active at work. Works night shift sometimes in fact today reports having only 2 hours sleep, had meetings this morning, worked overnight.   She feels last echocardiogram was 2-3 years ago but does not know the details This is not in our computer system for review Patient has never smoked EKG on today's visit shows normal sinus rhythm with rate 67 bpm, no significant ST or T-wave changes   She does report family history of SVT requiring ablation, father died at age 37 from cancer, mother with aortic valve stenosis but was in her 16s     Allergies  Allergen Reactions  . Prozac [Fluoxetine Hcl] Palpitations    Current Outpatient Prescriptions on File Prior to Visit  Medication Sig Dispense Refill  . ALPRAZolam (XANAX) 0.25 MG tablet TAKE 1 TABLET BY MOUTH TWICE DAILY AS NEEDED 60 tablet 3  . amLODipine (NORVASC) 5 MG tablet Take 1 tablet (5 mg total) by mouth daily. (Patient taking differently: Take 5 mg by mouth as needed. ) 90 tablet 3  . aspirin 81 MG tablet Take 81 mg by mouth daily.    . ciprofloxacin (CIPRO) 500 MG tablet Take 1 tablet (500 mg total) by mouth 2 (two) times daily. 20 tablet 0  . cyclobenzaprine (FLEXERIL) 5 MG tablet Take 1 tablet (5 mg total) by mouth 3 (three)  times daily as needed. 90 tablet 3  . ESTRACE VAGINAL 0.1 MG/GM vaginal cream INSERT 1/4 APPLICATORFUL VAGINALLY 2 (TWO) TIMES A WEEK AS DIRECTED. 42.5 g 5  . etodolac (LODINE) 500 MG tablet TAKE 1 TABLET BY MOUTH TWICE DAILY 60 tablet 3  . hydrochlorothiazide (HYDRODIURIL) 25 MG tablet TAKE 1 TABLET BY MOUTH DAILY. (Patient taking differently: TAKE 0.5 mg -1 TABLET BY MOUTH DAILY.) 90 tablet 1  . HYDROcodone-acetaminophen (NORCO) 10-325 MG per tablet Take 1 tablet by mouth every 8 (eight) hours as needed. 90 tablet 0  . labetalol (NORMODYNE) 200 MG tablet TAKE ONE TABLET BY MOUTH 2 TIMES A DAY 180 tablet 0  . Menthol, Topical Analgesic, (BIOFREEZE EX) Apply topically.    . metroNIDAZOLE (FLAGYL) 500 MG tablet Take 1 tablet (500 mg total) by mouth 3 (three) times daily. 21 tablet 0  . promethazine (PHENERGAN) 25 MG tablet Take 1 tablet (25 mg total) by mouth every 8 (eight) hours as needed for nausea or vomiting. 20 tablet 0  . traMADol (ULTRAM) 50 MG tablet TAKE 2 TABLETS BY MOUTH EVERY 6 HOURS AS NEEDED FOR PAIN 240 tablet 4  . valsartan (DIOVAN) 160 MG tablet TAKE 1 TABLET BY MOUTH ONCE DAILY 90 tablet 1   No current facility-administered medications on file prior to visit.    Past Medical History  Diagnosis Date  . Metatarsal fracture 2007  5th, from wearing high heels  . Unspecified essential hypertension   . Other symptoms involving abdomen and pelvis(789.9)   . Cervicalgia   . Breast screening, unspecified   . Retinal hemorrhage   . Symptomatic menopausal or female climacteric states   . Aortic valve disorders   . Aortic stenosis   . Heart murmur     Past Surgical History  Procedure Laterality Date  . Gyn surgery      hysterectomy- done for adenomysosis and endometriosis  . Oophorectomy      unilateral, due to ruptured ovarian cysts  . Osteotomy maxillary      due to bite problems  . Appendectomy      done during oophorectomy  . Cesarean section      x 2  . Tubal  ligation    . Abdominal hysterectomy      Social History  reports that she has never smoked. She has never used smokeless tobacco. She reports that she drinks alcohol. She reports that she does not use illicit drugs.  Family History family history includes Aortic stenosis in her mother; Cancer in her father and maternal aunt; Diabetes in her mother; Melanoma in her son; Retinoblastoma in her son.  Review of Systems  Constitutional: Negative.   Respiratory: Negative.   Cardiovascular: Negative.   Gastrointestinal: Negative.   Musculoskeletal: Negative.   Skin: Negative.   Neurological: Negative.   Hematological: Negative.   Psychiatric/Behavioral: Negative.   All other systems reviewed and are negative.   BP 164/100 mmHg  Pulse 67  Ht 5' 6.5" (1.689 m)  Wt 190 lb 12 oz (86.524 kg)  BMI 30.33 kg/m2  Physical Exam  Constitutional: She is oriented to person, place, and time. She appears well-developed and well-nourished.  HENT:  Head: Normocephalic.  Nose: Nose normal.  Mouth/Throat: Oropharynx is clear and moist.  Eyes: Conjunctivae are normal. Pupils are equal, round, and reactive to light.  Neck: Normal range of motion. Neck supple. No JVD present.  Cardiovascular: Normal rate, regular rhythm and intact distal pulses.  Exam reveals no gallop and no friction rub.   Murmur heard.  Systolic murmur is present with a grade of 1/6  Pulmonary/Chest: Effort normal and breath sounds normal. No respiratory distress. She has no wheezes. She has no rales. She exhibits no tenderness.  Abdominal: Soft. Bowel sounds are normal. She exhibits no distension. There is no tenderness.  Musculoskeletal: Normal range of motion. She exhibits no edema or tenderness.  Lymphadenopathy:    She has no cervical adenopathy.  Neurological: She is alert and oriented to person, place, and time. Coordination normal.  Skin: Skin is warm and dry. No rash noted. No erythema.  Psychiatric: She has a normal  mood and affect. Her behavior is normal. Judgment and thought content normal.

## 2015-02-01 NOTE — Patient Instructions (Signed)
You are doing well. No medication changes were made.  Please check blood pressure at your convenience  Please call us if you have new issues that need to be addressed before your next appt.  Your physician wants you to follow-up in: 12 months.  You will receive a reminder letter in the mail two months in advance. If you don't receive a letter, please call our office to schedule the follow-up appointment.

## 2015-02-01 NOTE — Assessment & Plan Note (Addendum)
Cholesterol is actually well below average  she is concerned about family history of coronary artery disease  CT coronary calcium score could be ordered for risk stratification  If she is interested  Recommended  Exercise, weight loss

## 2015-02-01 NOTE — Assessment & Plan Note (Signed)
Low-grade murmur , unable to exclude bicuspid aortic valve  Given intensity of murmur, likely does not have significant stenosis.  This was discussed with her. We will try to obtain prior echocardiogram before ordering any additional testing.  If these echoes are not complete, may need a outpatient echocardiogram for further staging  No further workup apart from periodic echocardiogram needed

## 2015-02-02 MED ORDER — HYDROCODONE-ACETAMINOPHEN 10-325 MG PO TABS: 1.0000 | ORAL_TABLET | Freq: Three times a day (TID) | ORAL | Status: DC | PRN

## 2015-02-02 NOTE — Addendum Note (Signed)
Addended by: Sherlene Shams on: 02/02/2015 10:07 PM   Modules accepted: Orders

## 2015-02-08 ENCOUNTER — Other Ambulatory Visit: Payer: Self-pay | Admitting: Internal Medicine

## 2015-03-02 ENCOUNTER — Other Ambulatory Visit: Payer: Self-pay | Admitting: Internal Medicine

## 2015-03-02 NOTE — Telephone Encounter (Signed)
Ok to refill,  printed rx for tramadol

## 2015-03-02 NOTE — Telephone Encounter (Signed)
Please advise refill? 

## 2015-03-16 ENCOUNTER — Other Ambulatory Visit: Payer: Self-pay | Admitting: Internal Medicine

## 2015-03-16 NOTE — Telephone Encounter (Signed)
Okay to refill Xanax? Last seen in June. No future appt scheduled

## 2015-03-18 NOTE — Telephone Encounter (Signed)
Ok to refill,  Authorized in epic but will need November appt

## 2015-03-20 ENCOUNTER — Encounter: Payer: Self-pay | Admitting: *Deleted

## 2015-03-20 NOTE — Telephone Encounter (Signed)
Sent mychart message

## 2015-04-03 NOTE — Telephone Encounter (Signed)
Unread mychart message mailed to patient 

## 2015-04-12 ENCOUNTER — Encounter: Payer: Self-pay | Admitting: Internal Medicine

## 2015-04-18 ENCOUNTER — Other Ambulatory Visit: Payer: Self-pay | Admitting: Internal Medicine

## 2015-04-21 ENCOUNTER — Ambulatory Visit (INDEPENDENT_AMBULATORY_CARE_PROVIDER_SITE_OTHER): Payer: 59 | Admitting: Internal Medicine

## 2015-04-21 ENCOUNTER — Encounter: Payer: Self-pay | Admitting: Internal Medicine

## 2015-04-21 VITALS — BP 126/82 | HR 79 | Temp 98.1°F | Resp 12 | Ht 67.0 in | Wt 187.0 lb

## 2015-04-21 DIAGNOSIS — E669 Obesity, unspecified: Secondary | ICD-10-CM

## 2015-04-21 DIAGNOSIS — E119 Type 2 diabetes mellitus without complications: Secondary | ICD-10-CM

## 2015-04-21 DIAGNOSIS — M489 Spondylopathy, unspecified: Secondary | ICD-10-CM

## 2015-04-21 DIAGNOSIS — I10 Essential (primary) hypertension: Secondary | ICD-10-CM | POA: Diagnosis not present

## 2015-04-21 DIAGNOSIS — M438X2 Other specified deforming dorsopathies, cervical region: Secondary | ICD-10-CM

## 2015-04-21 DIAGNOSIS — E1169 Type 2 diabetes mellitus with other specified complication: Secondary | ICD-10-CM

## 2015-04-21 LAB — HEMOGLOBIN A1C
Hgb A1c MFr Bld: 6.6 % — ABNORMAL HIGH (ref <5.7)
Mean Plasma Glucose: 143 mg/dL — ABNORMAL HIGH (ref <117)

## 2015-04-21 MED ORDER — HYDROCODONE-ACETAMINOPHEN 10-325 MG PO TABS: 1.0000 | ORAL_TABLET | Freq: Three times a day (TID) | ORAL | Status: DC | PRN

## 2015-04-21 NOTE — Patient Instructions (Signed)
You look fantastic!!  We are repeating your A1c today    You may want to try the "Orgain"  Brand of organic almond milk because it has more protein (10 mg per 8 ounce, compared to 1 gram/8 ounce) than the other brands of almond milk . It has the same amount of calcium as a glass of milk and is cholesterol free and low calorie.  Its available at [facility] [employer] and at the Co [employer]

## 2015-04-22 LAB — COMPREHENSIVE METABOLIC PANEL
ALT: 13 U/L (ref 6–29)
AST: 17 U/L (ref 10–35)
Albumin: 4.5 g/dL (ref 3.6–5.1)
Alkaline Phosphatase: 51 U/L (ref 33–130)
BUN: 26 mg/dL — ABNORMAL HIGH (ref 7–25)
CO2: 29 mmol/L (ref 20–31)
Calcium: 9.1 mg/dL (ref 8.6–10.4)
Chloride: 97 mmol/L — ABNORMAL LOW (ref 98–110)
Creat: 0.86 mg/dL (ref 0.50–1.05)
Glucose, Bld: 103 mg/dL — ABNORMAL HIGH (ref 65–99)
Potassium: 4 mmol/L (ref 3.5–5.3)
Sodium: 136 mmol/L (ref 135–146)
Total Bilirubin: 0.7 mg/dL (ref 0.2–1.2)
Total Protein: 7 g/dL (ref 6.1–8.1)

## 2015-04-22 LAB — MICROALBUMIN / CREATININE URINE RATIO
Creatinine, Urine: 65 mg/dL (ref 20–320)
Microalb Creat Ratio: 9 mcg/mg creat (ref <30)
Microalb, Ur: 0.6 mg/dL

## 2015-04-23 ENCOUNTER — Encounter: Payer: Self-pay | Admitting: Internal Medicine

## 2015-04-23 NOTE — Progress Notes (Signed)
Subjective:  Patient ID: Courtney Lopez, female    DOB: 1960-05-26  Age: 54 y.o. MRN: [id number]  CC: The primary encounter diagnosis was Diabetes mellitus without complication ([place]). Diagnoses of Obesity (BMI 30-39.9), Essential hypertension, Diabetes mellitus type 2 in obese [facility]), and Cervical spine disease were also pertinent to this visit.  HPI Courtney Lopez presents for follow up on new diagnosis of type 2 DM complicated by obesity.  She has been  Following a low glycemic ,low fat diet  For the last 10 days and feels great,  Losing weight.  Her chroinc neck pain has decreased,  She has used less narcotics over the last 10 days.      Outpatient Prescriptions Prior to Visit  Medication Sig Dispense Refill  . ALPRAZolam (XANAX) 0.25 MG tablet TAKE 1 TABLET BY MOUTH TWICE DAILY AS NEEDED 60 tablet 3  . amLODipine (NORVASC) 5 MG tablet Take 1 tablet (5 mg total) by mouth daily. (Patient taking differently: Take 5 mg by mouth as needed. ) 90 tablet 3  . aspirin 81 MG tablet Take 81 mg by mouth daily.    . cyclobenzaprine (FLEXERIL) 5 MG tablet Take 1 tablet (5 mg total) by mouth 3 (three) times daily as needed. 90 tablet 3  . ESTRACE VAGINAL 0.1 MG/GM vaginal cream INSERT 1/4 APPLICATORFUL VAGINALLY TWO TIMES A WEEK AS DIRECTED 42.5 g 5  . etodolac (LODINE) 500 MG tablet TAKE 1 TABLET BY MOUTH TWICE DAILY 60 tablet 3  . hydrochlorothiazide (HYDRODIURIL) 25 MG tablet TAKE 1 TABLET BY MOUTH DAILY. 90 tablet 1  . labetalol (NORMODYNE) 200 MG tablet TAKE ONE TABLET BY MOUTH 2 TIMES A DAY 180 tablet 0  . Menthol, Topical Analgesic, (BIOFREEZE EX) Apply topically.    . promethazine (PHENERGAN) 25 MG tablet Take 1 tablet (25 mg total) by mouth every 8 (eight) hours as needed for nausea or vomiting. 20 tablet 0  . traMADol (ULTRAM) 50 MG tablet TAKE 2 TABLETS BY MOUTH EVERY 6 HOURS AS NEEDED FOR PAIN 240 tablet 4  . valsartan (DIOVAN) 160 MG tablet TAKE 1 TABLET BY MOUTH ONCE DAILY 90  tablet 1  . HYDROcodone-acetaminophen (NORCO) 10-325 MG per tablet Take 1 tablet by mouth every 8 (eight) hours as needed. 90 tablet 0  . ciprofloxacin (CIPRO) 500 MG tablet Take 1 tablet (500 mg total) by mouth 2 (two) times daily. 20 tablet 0  . metroNIDAZOLE (FLAGYL) 500 MG tablet Take 1 tablet (500 mg total) by mouth 3 (three) times daily. 21 tablet 0   No facility-administered medications prior to visit.    Review of Systems;  Patient denies headache, fevers, malaise, unintentional weight loss, skin rash, eye pain, sinus congestion and sinus pain, sore throat, dysphagia,  hemoptysis , cough, dyspnea, wheezing, chest pain, palpitations, orthopnea, edema, abdominal pain, nausea, melena, diarrhea, constipation, flank pain, dysuria, hematuria, urinary  Frequency, nocturia, numbness, tingling, seizures,  Focal weakness, Loss of consciousness,  Tremor, insomnia, depression, anxiety, and suicidal ideation.      Objective:  BP 126/82 mmHg  Pulse 79  Temp(Src) 98.1 F (36.7 C) (Oral)  Resp 12  Ht _0  (1.702 m)  Wt 187 lb (84.823 kg)  BMI 29.28 kg/m2  SpO2 99%  BP Readings from Last 3 Encounters:  04/21/15 126/82  02/01/15 164/100  01/24/15 130/88    Wt Readings from Last 3 Encounters:  04/21/15 187 lb (84.823 kg)  02/01/15 190 lb 12 oz (86.524 kg)  01/24/15 189 lb 2  oz (85.787 kg)    General appearance: alert, cooperative and appears stated age Ears: normal TM's and external ear canals both ears Throat: lips, mucosa, and tongue normal; teeth and gums normal Neck: no adenopathy, no carotid bruit, supple, symmetrical, trachea midline and thyroid not enlarged, symmetric, no tenderness/mass/nodules Back: symmetric, no curvature. ROM normal. No CVA tenderness. Lungs: clear to auscultation bilaterally Heart: regular rate and rhythm, S1, S2 normal, no murmur, click, rub or gallop Abdomen: soft, non-tender; bowel sounds normal; no masses,  no organomegaly Pulses: 2+ and  symmetric Skin: Skin color, texture, turgor normal. No rashes or lesions Lymph nodes: Cervical, supraclavicular, and axillary nodes normal.  Lab Results  Component Value Date   HGBA1C 6.6* 04/21/2015   HGBA1C 6.5 10/25/2014   HGBA1C 6.2 12/06/2013    Lab Results  Component Value Date   CREATININE 0.86 04/21/2015   CREATININE 0.77 10/25/2014   CREATININE 0.8 12/06/2013    Lab Results  Component Value Date   WBC 8.3 02/17/2013   HGB 13.1 02/17/2013   HCT 37.3 02/17/2013   PLT 291 02/17/2013   GLUCOSE 103* 04/21/2015   CHOL 186 10/25/2014   TRIG 138.0 10/25/2014   HDL 50.10 10/25/2014   LDLCALC 108* 10/25/2014   ALT 13 04/21/2015   AST 17 04/21/2015   NA 136 04/21/2015   K 4.0 04/21/2015   CL 97* 04/21/2015   CREATININE 0.86 04/21/2015   BUN 26* 04/21/2015   CO2 29 04/21/2015   TSH 2.56 12/06/2013   HGBA1C 6.6* 04/21/2015   MICROALBUR 0.6 04/21/2015    No results found.  Assessment & Plan:   Problem List Items Addressed This Visit    Cervical spine disease    Her pain has improved recently and she is using less vicodin       Hypertension    Well controlled on current regimen. Renal function stable, no changes today.  Lab Results  Component Value Date   CREATININE 0.86 04/21/2015   Lab Results  Component Value Date   NA 136 04/21/2015   K 4.0 04/21/2015   CL 97* 04/21/2015   CO2 29 04/21/2015   Lab Results  Component Value Date   MICROALBUR 0.6 04/21/2015         Obesity (BMI 30-39.9)    I have congratulated her in her weight loss and encouraged  Continued weight loss with goal of 10% of body weight over the next 6 months using a low glycemic index diet and regular exercise a minimum of 5 days per week.        Diabetes mellitus type 2 in obese ([place])    Improving control with low GI diet and exercise .  hemoglobin A1c at goal. . Patient is referred for  eye exam and foot exam was done today.  There is  no proteinuria onmicro urinalysis .   Fasting lipids are close to gaolwill be repeated  and statin therapy advised if indicated by application of new ACC guidelines based on patient's 10 year risk of CAD .  Lab Results  Component Value Date   HGBA1C 6.6* 04/21/2015    Lab Results  Component Value Date   CHOL 186 10/25/2014   HDL 50.10 10/25/2014   LDLCALC 108* 10/25/2014   TRIG 138.0 10/25/2014   CHOLHDL 4 10/25/2014               Other Visit Diagnoses    Diabetes mellitus without complication ([place])    -  Primary  Relevant Orders    Comp Met (CMET) (Completed)    Hemoglobin A1c (Completed)    Urine Microalbumin w/creat. ratio (Completed)       I have discontinued Ms. Caffee's metroNIDAZOLE and ciprofloxacin. I have also changed her HYDROcodone-acetaminophen. Additionally, I am having her maintain her aspirin, (Menthol, Topical Analgesic, (BIOFREEZE EX)), cyclobenzaprine, amLODipine, valsartan, etodolac, promethazine, ESTRACE VAGINAL, hydrochlorothiazide, traMADol, ALPRAZolam, and labetalol.  Meds ordered this encounter  Medications  . HYDROcodone-acetaminophen (NORCO) 10-325 MG tablet    Sig: Take 1 tablet by mouth every 8 (eight) hours as needed.    Dispense:  90 tablet    Refill:  0    Medications Discontinued During This Encounter  Medication Reason  . ciprofloxacin (CIPRO) 500 MG tablet Completed Course  . metroNIDAZOLE (FLAGYL) 500 MG tablet Completed Course  . HYDROcodone-acetaminophen (NORCO) 10-325 MG per tablet Reorder   A total of 25 minutes of face to face time was spent with patient more than half of which was spent in counselling about the above mentioned conditions  and coordination of care   Follow-up: Return in about 3 months (around 07/20/2015) for follow up diabetes.   Crecencio Mc, MD

## 2015-04-23 NOTE — Assessment & Plan Note (Signed)
I have congratulated her in her weight loss and encouraged  Continued weight loss with goal of 10% of body weight over the next 6 months using a low glycemic index diet and regular exercise a minimum of 5 days per week.

## 2015-04-23 NOTE — Assessment & Plan Note (Signed)
Improving control with low GI diet and exercise .  hemoglobin A1c at goal. . Patient is referred for  eye exam and foot exam was done today.  There is  no proteinuria onmicro urinalysis .  Fasting lipids are close to gaolwill be repeated  and statin therapy advised if indicated by application of new ACC guidelines based on patient's 10 year risk of CAD .  Lab Results  Component Value Date   HGBA1C 6.6* 04/21/2015    Lab Results  Component Value Date   CHOL 186 10/25/2014   HDL 50.10 10/25/2014   LDLCALC 108* 10/25/2014   TRIG 138.0 10/25/2014   CHOLHDL 4 10/25/2014

## 2015-04-23 NOTE — Assessment & Plan Note (Signed)
Her pain has improved recently and she is using less vicodin

## 2015-04-23 NOTE — Assessment & Plan Note (Signed)
Well controlled on current regimen. Renal function stable, no changes today.  Lab Results  Component Value Date   CREATININE 0.86 04/21/2015   Lab Results  Component Value Date   NA 136 04/21/2015   K 4.0 04/21/2015   CL 97* 04/21/2015   CO2 29 04/21/2015   Lab Results  Component Value Date   MICROALBUR 0.6 04/21/2015

## 2015-05-16 ENCOUNTER — Other Ambulatory Visit: Payer: Self-pay | Admitting: Internal Medicine

## 2015-06-26 ENCOUNTER — Other Ambulatory Visit: Payer: Self-pay | Admitting: Internal Medicine

## 2015-06-27 ENCOUNTER — Other Ambulatory Visit: Payer: Self-pay | Admitting: Internal Medicine

## 2015-06-27 MED ORDER — HYDROCODONE-ACETAMINOPHEN 10-325 MG PO TABS: 1.0000 | ORAL_TABLET | Freq: Three times a day (TID) | ORAL | Status: DC | PRN

## 2015-06-27 NOTE — Telephone Encounter (Signed)
Refilled in December. Please advise? 

## 2015-06-27 NOTE — Telephone Encounter (Signed)
Ok to refill,  printed rx  

## 2015-07-20 ENCOUNTER — Other Ambulatory Visit: Payer: Self-pay | Admitting: Internal Medicine

## 2015-07-24 ENCOUNTER — Encounter: Payer: Self-pay | Admitting: Physician Assistant

## 2015-07-24 ENCOUNTER — Ambulatory Visit: Payer: Self-pay | Admitting: Physician Assistant

## 2015-07-24 VITALS — BP 124/90 | HR 80 | Temp 99.9°F

## 2015-07-24 DIAGNOSIS — J069 Acute upper respiratory infection, unspecified: Secondary | ICD-10-CM

## 2015-07-24 DIAGNOSIS — R509 Fever, unspecified: Secondary | ICD-10-CM

## 2015-07-24 LAB — POCT INFLUENZA A/B
Influenza A, POC: NEGATIVE
Influenza B, POC: NEGATIVE

## 2015-07-24 MED ORDER — FLUTICASONE PROPIONATE 50 MCG/ACT NA SUSP: 2.0000 | Freq: Every day | NASAL | Status: DC

## 2015-07-24 MED ORDER — AZITHROMYCIN 250 MG PO TABS: ORAL_TABLET | ORAL | Status: AC

## 2015-07-24 NOTE — Progress Notes (Signed)
S: C/o runny nose, ear pain, cough and congestion for 7 days, + fever, chills, denies cp/sob, v/d, or body aches; took ibuprofen earlier today   Using otc meds: saline, advil, tylenol cold, flonase  O: PE: vitals w elevated temp, nad,  perrl eomi, normocephalic, tms dull, nasal mucosa red and swollen, throat injected, neck supple no lymph, lungs c t a, cv rrr, neuro intact, flu swab neg  A:  Acute  uri   P: zpack, flonase; drink fluids, continue regular meds , use otc meds of choice, return if not improving in 5 days, return earlier if worsening

## 2015-07-26 ENCOUNTER — Encounter: Payer: Self-pay | Admitting: Physician Assistant

## 2015-07-26 ENCOUNTER — Ambulatory Visit: Payer: Self-pay | Admitting: Physician Assistant

## 2015-07-26 VITALS — BP 130/90 | Temp 99.1°F

## 2015-07-26 DIAGNOSIS — R509 Fever, unspecified: Secondary | ICD-10-CM

## 2015-07-26 LAB — POCT INFLUENZA A/B
Influenza A, POC: NEGATIVE
Influenza B, POC: NEGATIVE

## 2015-07-26 MED ORDER — METHYLPREDNISOLONE 4 MG PO TBPK: ORAL_TABLET | ORAL | Status: AC

## 2015-07-26 NOTE — Progress Notes (Signed)
S: c/o continued fever, chills, cough and congestion, even her hair hurts, some wheezing at night, using tylenol and ibuprofen for fever, taking zpack as instructed, flu swab in clinic on Mon was neg  O: vitals w elevated temp, nad, lungs c t a, cv rrr, repeat flu swab negative  A: influnenza like symptoms, acute uri  P: continue zpack, add medrol dose pack, no work until has not had a fever for 24-48 hours, if worsening will call in levaquin

## 2015-07-31 ENCOUNTER — Ambulatory Visit: Payer: Self-pay | Admitting: Physician Assistant

## 2015-08-30 ENCOUNTER — Other Ambulatory Visit: Payer: Self-pay | Admitting: Internal Medicine

## 2015-08-30 ENCOUNTER — Telehealth: Payer: Self-pay

## 2015-08-30 NOTE — Telephone Encounter (Signed)
Patient request hydrocodone refill.  Please advise. 

## 2015-08-31 MED ORDER — HYDROCODONE-ACETAMINOPHEN 10-325 MG PO TABS: 1.0000 | ORAL_TABLET | Freq: Three times a day (TID) | ORAL | Status: DC | PRN

## 2015-08-31 NOTE — Telephone Encounter (Signed)
Notified patient ready for pick up placed at front desk.

## 2015-08-31 NOTE — Telephone Encounter (Signed)
REFILLED

## 2015-09-21 ENCOUNTER — Other Ambulatory Visit: Payer: Self-pay | Admitting: Internal Medicine

## 2015-10-19 ENCOUNTER — Other Ambulatory Visit: Payer: Self-pay | Admitting: Internal Medicine

## 2015-10-19 NOTE — Telephone Encounter (Signed)
Refill for 30 days only.  OFFICE VISIT NEEDED prior to any more refills 

## 2015-10-19 NOTE — Telephone Encounter (Signed)
Pt is requesting a refill. Last filled on 03/02/2015. #[phone] + 4, last OV 04/21/2015. Okay to refill?

## 2015-10-19 NOTE — Telephone Encounter (Signed)
Refill faxed patient has an appointment

## 2015-10-26 ENCOUNTER — Ambulatory Visit: Payer: Self-pay | Admitting: Internal Medicine

## 2015-10-26 ENCOUNTER — Ambulatory Visit (INDEPENDENT_AMBULATORY_CARE_PROVIDER_SITE_OTHER): Payer: 59 | Admitting: Internal Medicine

## 2015-10-26 ENCOUNTER — Encounter: Payer: Self-pay | Admitting: Internal Medicine

## 2015-10-26 VITALS — BP 148/88 | HR 66 | Temp 98.3°F | Resp 12 | Ht 67.0 in | Wt 179.5 lb

## 2015-10-26 DIAGNOSIS — E119 Type 2 diabetes mellitus without complications: Secondary | ICD-10-CM | POA: Diagnosis not present

## 2015-10-26 DIAGNOSIS — E663 Overweight: Secondary | ICD-10-CM

## 2015-10-26 DIAGNOSIS — E669 Obesity, unspecified: Secondary | ICD-10-CM

## 2015-10-26 DIAGNOSIS — E785 Hyperlipidemia, unspecified: Secondary | ICD-10-CM | POA: Diagnosis not present

## 2015-10-26 DIAGNOSIS — E1169 Type 2 diabetes mellitus with other specified complication: Secondary | ICD-10-CM

## 2015-10-26 DIAGNOSIS — M489 Spondylopathy, unspecified: Secondary | ICD-10-CM

## 2015-10-26 DIAGNOSIS — Z0000 Encounter for general adult medical examination without abnormal findings: Secondary | ICD-10-CM

## 2015-10-26 DIAGNOSIS — M438X2 Other specified deforming dorsopathies, cervical region: Secondary | ICD-10-CM

## 2015-10-26 LAB — MICROALBUMIN / CREATININE URINE RATIO
Creatinine,U: 84.8 mg/dL
Microalb Creat Ratio: 1.2 mg/g (ref 0.0–30.0)
Microalb, Ur: 1 mg/dL (ref 0.0–1.9)

## 2015-10-26 LAB — COMPREHENSIVE METABOLIC PANEL
ALT: 20 U/L (ref 0–35)
AST: 22 U/L (ref 0–37)
Albumin: 4.7 g/dL (ref 3.5–5.2)
Alkaline Phosphatase: 68 U/L (ref 39–117)
BUN: 25 mg/dL — ABNORMAL HIGH (ref 6–23)
CO2: 32 mEq/L (ref 19–32)
Calcium: 9.7 mg/dL (ref 8.4–10.5)
Chloride: 99 mEq/L (ref 96–112)
Creatinine, Ser: 0.83 mg/dL (ref 0.40–1.20)
GFR: 75.37 mL/min (ref >60.00)
Glucose, Bld: 126 mg/dL — ABNORMAL HIGH (ref 70–99)
Potassium: 4.4 mEq/L (ref 3.5–5.1)
Sodium: 137 mEq/L (ref 135–145)
Total Bilirubin: 0.5 mg/dL (ref 0.2–1.2)
Total Protein: 7.7 g/dL (ref 6.0–8.3)

## 2015-10-26 LAB — LIPID PANEL
Cholesterol: 181 mg/dL (ref 0–200)
HDL: 53.8 mg/dL (ref >39.00)
LDL Cholesterol: 100 mg/dL — ABNORMAL HIGH (ref 0–99)
NonHDL: 127.4
Total CHOL/HDL Ratio: 3
Triglycerides: 138 mg/dL (ref 0.0–149.0)
VLDL: 27.6 mg/dL (ref 0.0–40.0)

## 2015-10-26 LAB — HEMOGLOBIN A1C: Hgb A1c MFr Bld: 6.1 % (ref 4.6–6.5)

## 2015-10-26 LAB — LDL CHOLESTEROL, DIRECT: Direct LDL: 122 mg/dL

## 2015-10-26 MED ORDER — HYDROCODONE-ACETAMINOPHEN 10-325 MG PO TABS: 1.0000 | ORAL_TABLET | Freq: Three times a day (TID) | ORAL | Status: DC | PRN

## 2015-10-26 MED ORDER — DOXYCYCLINE HYCLATE 100 MG PO CAPS: 100.0000 mg | ORAL_CAPSULE | Freq: Two times a day (BID) | ORAL | Status: DC

## 2015-10-26 MED ORDER — PREDNISONE 10 MG PO TABS: ORAL_TABLET | ORAL | Status: DC

## 2015-10-26 MED ORDER — TRAMADOL HCL 50 MG PO TABS: 100.0000 mg | ORAL_TABLET | Freq: Four times a day (QID) | ORAL | Status: DC | PRN

## 2015-10-26 NOTE — Patient Instructions (Signed)

## 2015-10-26 NOTE — Progress Notes (Signed)
Pre-visit discussion using our clinic review tool. No additional management support is needed unless otherwise documented below in the visit note.  

## 2015-10-26 NOTE — Progress Notes (Signed)
Subjective:  Patient ID: Courtney Lopez, female    DOB: 17-Jan-1961  Age: 55 y.o. MRN: [id number]  CC: The primary encounter diagnosis was Diabetes mellitus without complication (HCC). Diagnoses of Hyperlipidemia with target LDL less than 100, Diabetes mellitus type 2 in obese (HCC), Overweight, Encounter for preventive health examination, and Cervical spine disease were also pertinent to this visit.  HPI Courtney Lopez presents for 6 month follow up on type 2 DM with obesity and hypertension. managed with diet alone.  He has  lost 8-10 lbs since last visit . Eating a smoothie once daily .  Using yardwork for exercise,  some tick exposure.  Works nights at [employer] 3 /week,  Sleeps well during the day   Recent Flu like illness , treated for flu and missed a week of work  In March .    Cervical spinal stenosis:   had a 3 week episode of arm pain , initally  On the  left,  Then on the right in the C7 distribution.  Last ESI was by [place] on the left .  Discussed use of prednisone taper during flare.    Lab Results  Component Value Date   HGBA1C 6.1 10/26/2015     Outpatient Prescriptions Prior to Visit  Medication Sig Dispense Refill  . ALPRAZolam (XANAX) 0.25 MG tablet TAKE 1 TABLET BY MOUTH TWICE DAILY AS NEEDED 60 tablet 3  . aspirin 81 MG tablet Take 81 mg by mouth daily.    [name] ESTRACE VAGINAL 0.1 MG/GM vaginal cream INSERT 1/4 APPLICATORFUL VAGINALLY TWO TIMES A WEEK AS DIRECTED 42.5 g 5  . etodolac (LODINE) 500 MG tablet TAKE 1 TABLET BY MOUTH TWICE DAILY 60 tablet 3  . fluticasone (FLONASE) 50 MCG/ACT nasal spray Place 2 sprays into both nostrils daily. 16 g 6  . hydrochlorothiazide (HYDRODIURIL) 25 MG tablet TAKE 1 TABLET BY MOUTH DAILY. 90 tablet 1  . labetalol (NORMODYNE) 200 MG tablet TAKE ONE TABLET BY MOUTH 2 TIMES A DAY 180 tablet 0  . Menthol, Topical Analgesic, (BIOFREEZE EX) Apply topically.    . valsartan (DIOVAN) 160 MG tablet TAKE 1 TABLET BY MOUTH ONCE DAILY  90 tablet 1  . HYDROcodone-acetaminophen (NORCO) 10-325 MG tablet Take 1 tablet by mouth every 8 (eight) hours as needed. 90 tablet 0  . traMADol (ULTRAM) 50 MG tablet TAKE 2 TABLETS BY MOUTH EVERY 6 HOURS AS NEEDED FOR PAIN 240 tablet 0  . amLODipine (NORVASC) 5 MG tablet Take 1 tablet (5 mg total) by mouth daily. (Patient not taking: Reported on 10/26/2015) 90 tablet 3  . cyclobenzaprine (FLEXERIL) 5 MG tablet Take 1 tablet (5 mg total) by mouth 3 (three) times daily as needed. (Patient not taking: Reported on 10/26/2015) 90 tablet 3  . promethazine (PHENERGAN) 25 MG tablet Take 1 tablet (25 mg total) by mouth every 8 (eight) hours as needed for nausea or vomiting. (Patient not taking: Reported on 07/24/2015) 20 tablet 0   No facility-administered medications prior to visit.    Review of Systems;  Patient denies headache, fevers, malaise, unintentional weight loss, skin rash, eye pain, sinus congestion and sinus pain, sore throat, dysphagia,  hemoptysis , cough, dyspnea, wheezing, chest pain, palpitations, orthopnea, edema, abdominal pain, nausea, melena, diarrhea, constipation, flank pain, dysuria, hematuria, urinary  Frequency, nocturia, numbness, tingling, seizures,  Focal weakness, Loss of consciousness,  Tremor, insomnia, depression, anxiety, and suicidal ideation.      Objective:  BP 148/88 mmHg  Pulse 66  Temp(Src) 98.3 F (36.8 C) (Oral)  Resp 12  Ht 5\' 7"  (1.702 m)  Wt 179 lb 8 oz (81.421 kg)  BMI 28.11 kg/m2  SpO2 98%  BP Readings from Last 3 Encounters:  10/26/15 148/88  07/26/15 130/90  07/24/15 124/90    Wt Readings from Last 3 Encounters:  10/26/15 179 lb 8 oz (81.421 kg)  04/21/15 187 lb (84.823 kg)  02/01/15 190 lb 12 oz (86.524 kg)    General appearance: alert, cooperative and appears stated age Ears: normal TM's and external ear canals both ears Throat: lips, mucosa, and tongue normal; teeth and gums normal Neck: no adenopathy, no carotid bruit, supple,  symmetrical, trachea midline and thyroid not enlarged, symmetric, no tenderness/mass/nodules Back: symmetric, no curvature. ROM normal. No CVA tenderness. Lungs: clear to auscultation bilaterally Heart: regular rate and rhythm, S1, S2 normal, no murmur, click, rub or gallop Abdomen: soft, non-tender; bowel sounds normal; no masses,  no organomegaly Pulses: 2+ and symmetric Skin: Skin color, texture, turgor normal. No rashes or lesions Lymph nodes: Cervical, supraclavicular, and axillary nodes normal.  Lab Results  Component Value Date   HGBA1C 6.1 10/26/2015   HGBA1C 6.6* 04/21/2015   HGBA1C 6.5 10/25/2014    Lab Results  Component Value Date   CREATININE 0.83 10/26/2015   CREATININE 0.86 04/21/2015   CREATININE 0.77 10/25/2014    Lab Results  Component Value Date   WBC 8.3 02/17/2013   HGB 13.1 02/17/2013   HCT 37.3 02/17/2013   PLT 291 02/17/2013   GLUCOSE 126* 10/26/2015   CHOL 181 10/26/2015   TRIG 138.0 10/26/2015   HDL 53.80 10/26/2015   LDLDIRECT 122.0 10/26/2015   LDLCALC 100* 10/26/2015   ALT 20 10/26/2015   AST 22 10/26/2015   NA 137 10/26/2015   K 4.4 10/26/2015   CL 99 10/26/2015   CREATININE 0.83 10/26/2015   BUN 25* 10/26/2015   CO2 32 10/26/2015   TSH 2.56 12/06/2013   HGBA1C 6.1 10/26/2015   MICROALBUR 1.0 10/26/2015    [facility] W/o Cm  01/31/2014   PRIOR REPORT IMPORTED FROM AN EXTERNAL SYSTEM  CLINICAL DATA:  Neck and left arm pain with numbness and decreased range of motion for several months. EXAM: MRI CERVICAL SPINE WITHOUT CONTRAST TECHNIQUE: Multiplanar, multisequence MR imaging of the cervical spine was performed. No intravenous contrast was administered. COMPARISON:  MRI cervical spine 07/04/2011. FINDINGS: Vertebral body height, signal and alignment are normal. The craniocervical junction is normal. Cervical cord signal is normal. Imaged paraspinous structures are unremarkable. C2-3:  Negative. C3-4: There is a new left paracentral disc  protrusion which effaces the ventral thecal sac cord without deformity. Foramina are widely patent. C4-5: There is a disc osteophyte complex and uncovertebral disease. The ventral thecal sac is effaced. Moderate to moderately severe foraminal narrowing is worse on the right. Spondylosis appears somewhat progressive since the prior examination. C5-6: There is a disc osteophyte complex and left worse than right uncovertebral disease. There is mild to moderate central canal narrowing. Uncovertebral disease causes severe left and moderately severe right foraminal narrowing. The appearance is not markedly changed. C6-7: Minimal disc bulge without central canal or foraminal narrowing, unchanged. C7-T1:  Negative. IMPRESSION: New small left paracentral protrusion at C3-4 effaces the ventral thecal sac without cord deformity. Some progression in spondylosis at C4-5 where there is mild central canal and moderate to moderately severe bilateral foraminal narrowing, worse on the right. No change in severe left and moderately severe right  foraminal narrowing at C5-6. The central canal is mildly to moderately narrowed at this level. Electronically Signed   By: Drusilla Kanner M.D.   On: 01/31/2014 08:29     Assessment & Plan:   Problem List Items Addressed This Visit    Cervical spine disease    Chronic , with cervical stenosis and  recent exacerbation causing radiculopathy.  rx steroid taper for next episode.  vicodin refilled for daily use          Overweight    I have congratulated her in reduction of   BMI and encouraged  Continued weight loss with goal of 10% of body weight over the next 6 months using a low glycemic index diet and regular exercise a minimum of 5 days per week.        Diabetes mellitus type 2 in obese (HCC)    Improving control with low GI diet and exercise .  hemoglobin A1c at goal. . Patient is referred for  eye exam and foot exam is normal today.  There is  no proteinuria on micro  urinalysis .  Fasting lipids are close to goal and statin therapy is deferred per patient request. .  Lab Results  Component Value Date   HGBA1C 6.1 10/26/2015    Lab Results  Component Value Date   CHOL 181 10/26/2015   HDL 53.80 10/26/2015   LDLCALC 100* 10/26/2015   LDLDIRECT 122.0 10/26/2015   TRIG 138.0 10/26/2015   CHOLHDL 3 10/26/2015                Encounter for preventive health examination   Hyperlipidemia with target LDL less than 100   Relevant Orders   LDL cholesterol, direct (Completed)    Other Visit Diagnoses    Diabetes mellitus without complication (HCC)    -  Primary    Relevant Orders    Comprehensive metabolic panel (Completed)    Hemoglobin A1c (Completed)    Lipid panel (Completed)    Microalbumin / creatinine urine ratio (Completed)      A total of 25 minutes of face to face time was spent with patient more than half of which was spent in counselling about the above mentioned conditions  and coordination of care .   I have discontinued Ms. Weihe's cyclobenzaprine, amLODipine, and promethazine. I have also changed her traMADol. Additionally, I am having her start on predniSONE and doxycycline. Lastly, I am having her maintain her aspirin, (Menthol, Topical Analgesic, (BIOFREEZE EX)), etodolac, ESTRACE VAGINAL, valsartan, labetalol, ALPRAZolam, fluticasone, hydrochlorothiazide, and HYDROcodone-acetaminophen.  Meds ordered this encounter  Medications  . predniSONE (DELTASONE) 10 MG tablet    Sig: 6 tablets on Day 1 , then reduce by 1 tablet daily until gone    Dispense:  21 tablet    Refill:  0  . traMADol (ULTRAM) 50 MG tablet    Sig: Take 2 tablets (100 mg total) by mouth every 6 (six) hours as needed. for pain    Dispense:  240 tablet    Refill:  3    Keep on file for future refills  . DISCONTD: HYDROcodone-acetaminophen (NORCO) 10-325 MG tablet    Sig: Take 1 tablet by mouth every 8 (eight) hours as needed.    Dispense:  90 tablet      Refill:  0  . doxycycline (VIBRAMYCIN) 100 MG capsule    Sig: Take 1 capsule (100 mg total) by mouth 2 (two) times daily. For tick fever    Dispense:  14 capsule    Refill:  0  . DISCONTD: HYDROcodone-acetaminophen (NORCO) 10-325 MG tablet    Sig: Take 1 tablet by mouth every 8 (eight) hours as needed.    Dispense:  90 tablet    Refill:  0    mAY REFILL ON OR AFTER November 25 2015  . HYDROcodone-acetaminophen (NORCO) 10-325 MG tablet    Sig: Take 1 tablet by mouth every 8 (eight) hours as needed.    Dispense:  90 tablet    Refill:  0    MAY REFILL ON OR AFTER December 26 2015    Medications Discontinued During This Encounter  Medication Reason  . traMADol (ULTRAM) 50 MG tablet Reorder  . HYDROcodone-acetaminophen (NORCO) 10-325 MG tablet Reorder  . HYDROcodone-acetaminophen (NORCO) 10-325 MG tablet Reorder  . HYDROcodone-acetaminophen (NORCO) 10-325 MG tablet Reorder  . promethazine (PHENERGAN) 25 MG tablet   . cyclobenzaprine (FLEXERIL) 5 MG tablet   . amLODipine (NORVASC) 5 MG tablet     Follow-up: Return in about 6 months (around 04/26/2016) for WELLNESS, DM FOLLOW UP 45 MIN .   Sherlene Shams, MD

## 2015-10-27 ENCOUNTER — Other Ambulatory Visit: Payer: Self-pay | Admitting: Internal Medicine

## 2015-10-29 ENCOUNTER — Encounter: Payer: Self-pay | Admitting: Internal Medicine

## 2015-10-29 DIAGNOSIS — Z0000 Encounter for general adult medical examination without abnormal findings: Secondary | ICD-10-CM | POA: Insufficient documentation

## 2015-10-29 NOTE — Assessment & Plan Note (Signed)
Improving control with low GI diet and exercise .  hemoglobin A1c at goal. . Patient is referred for  eye exam and foot exam is normal today.  There is  no proteinuria on micro urinalysis .  Fasting lipids are close to goal and statin therapy is deferred per patient request. .  Lab Results  Component Value Date   HGBA1C 6.1 10/26/2015    Lab Results  Component Value Date   CHOL 181 10/26/2015   HDL 53.80 10/26/2015   LDLCALC 100* 10/26/2015   LDLDIRECT 122.0 10/26/2015   TRIG 138.0 10/26/2015   CHOLHDL 3 10/26/2015

## 2015-10-29 NOTE — Assessment & Plan Note (Addendum)
Chronic , with cervical stenosis and  recent exacerbation causing radiculopathy.  rx steroid taper for next episode.  vicodin refilled for daily use

## 2015-10-29 NOTE — Assessment & Plan Note (Addendum)
I have congratulated her in reduction of   BMI and encouraged  Continued weight loss with goal of 10% of body weight over the next 6 months using a low glycemic index diet and regular exercise a minimum of 5 days per week.     

## 2015-11-21 ENCOUNTER — Other Ambulatory Visit: Payer: Self-pay | Admitting: Internal Medicine

## 2015-11-22 NOTE — Telephone Encounter (Signed)
Rx faxed to [facility] employee pharmacy 

## 2015-12-19 ENCOUNTER — Other Ambulatory Visit: Payer: Self-pay | Admitting: Internal Medicine

## 2016-01-08 DIAGNOSIS — H524 Presbyopia: Secondary | ICD-10-CM | POA: Diagnosis not present

## 2016-01-18 ENCOUNTER — Other Ambulatory Visit: Payer: Self-pay | Admitting: Internal Medicine

## 2016-01-27 ENCOUNTER — Encounter: Payer: Self-pay | Admitting: *Deleted

## 2016-01-27 ENCOUNTER — Ambulatory Visit
Admission: EM | Admit: 2016-01-27 | Discharge: 2016-01-27 | Disposition: A | Discharge status: Home / Self Care | Payer: 59 | Attending: Family Medicine | Admitting: Family Medicine

## 2016-01-27 DIAGNOSIS — S0501XA Injury of conjunctiva and corneal abrasion without foreign body, right eye, initial encounter: Secondary | ICD-10-CM

## 2016-01-27 MED ORDER — POLYMYXIN B-TRIMETHOPRIM 10000-0.1 UNIT/ML-% OP SOLN: 1.0000 [drp] | Freq: Four times a day (QID) | OPHTHALMIC | 0 refills | Status: DC

## 2016-01-27 NOTE — ED Triage Notes (Signed)
Patient started having right eye pain yesterday when she left her contact in to long. Patient was able to remove the contact, but is experiencing discomfort.

## 2016-01-27 NOTE — ED Provider Notes (Signed)
CSN: [id number]     Arrival date & time 01/27/16  0813 History   First MD Initiated Contact with Patient 01/27/16 0848     Chief Complaint  Patient presents with  . Eye Injury   (Consider location/radiation/quality/duration/timing/severity/associated sxs/prior Treatment) [name] is a well-appearing 55 y.o female, with monovision waring contact lens only for her right eye, presents today for right eye redness and right eye pain onset 2 days ago. She worked 14 hours nightshift on Thursday night wearing her contact lens. She noticed her eye got really dry near the end of her shift, and when she got home on Friday morning she noticed the eye pain after she took her contact lens off. She reports pain and redness since her contact lens removal. She also endorses blurry vision, foreign body sensation, and excessive tearing. She denies photosensitivity, headache, dizziness.       Past Medical History:  Diagnosis Date  . Aortic stenosis   . Aortic valve disorders   . Breast screening, unspecified   . Cervicalgia   . Heart murmur   . Metatarsal fracture 2007   5th, from wearing high heels  . Other symptoms involving abdomen and pelvis(789.9)   . Retinal hemorrhage   . Symptomatic menopausal or female climacteric states   . Unspecified essential hypertension    Past Surgical History:  Procedure Laterality Date  . ABDOMINAL HYSTERECTOMY    . APPENDECTOMY     done during oophorectomy  . CESAREAN SECTION     x 2  . gyn surgery     hysterectomy- done for adenomysosis and endometriosis  . OOPHORECTOMY     unilateral, due to ruptured ovarian cysts  . OSTEOTOMY MAXILLARY     due to bite problems  . TUBAL LIGATION     Family History  Problem Relation Age of Onset  . Diabetes Mother   . Aortic stenosis Mother   . Cancer Father     leukemia  . Retinoblastoma Son   . Melanoma Son   . Cancer Maternal Aunt     breast   Social History  Substance Use Topics  . Smoking status:  Never Smoker  . Smokeless tobacco: Never Used  . Alcohol use Yes     Comment: occassional   OB History    No data available     Review of Systems  Constitutional: Negative for chills, fatigue and fever.  Eyes: Positive for pain, redness and visual disturbance. Negative for photophobia, discharge and itching.       + foreign body sensation, excessive tearing  Respiratory: Negative for cough and shortness of breath.   Cardiovascular: Negative for chest pain, palpitations and leg swelling.  Gastrointestinal: Negative for abdominal pain, diarrhea, nausea and vomiting.  Neurological: Negative for dizziness, weakness and headaches.    Allergies  Prozac [fluoxetine hcl]  Home Medications   Prior to Admission medications   Medication Sig Start Date End Date Taking? Authorizing Provider  ALPRAZolam Prudy Feeler) 0.25 MG tablet TAKE 1 TABLET BY MOUTH TWICE DAILY AS NEEDED 11/22/15  Yes Sherlene Shams, MD  aspirin 81 MG tablet Take 81 mg by mouth daily.   Yes Historical Provider, MD  ESTRACE VAGINAL 0.1 MG/GM vaginal cream INSERT 1/4 APPLICATORFUL VAGINALLY TWO TIMES A WEEK AS DIRECTED 12/19/15  Yes Sherlene Shams, MD  etodolac (LODINE) 500 MG tablet TAKE 1 TABLET BY MOUTH TWICE DAILY 12/12/14  Yes Sherlene Shams, MD  fluticasone (FLONASE) 50 MCG/ACT nasal spray Place 2 sprays into  both nostrils daily. 07/24/15  Yes Faythe GheeSusan W Fisher, PA-C  hydrochlorothiazide (HYDRODIURIL) 25 MG tablet TAKE 1 TABLET BY MOUTH DAILY. 09/21/15  Yes Sherlene Shamseresa L Tullo, MD  HYDROcodone-acetaminophen (NORCO) 10-325 MG tablet Take 1 tablet by mouth every 8 (eight) hours as needed. 10/26/15  Yes Sherlene Shamseresa L Tullo, MD  labetalol (NORMODYNE) 200 MG tablet TAKE ONE TABLET BY MOUTH 2 TIMES A DAY 01/18/16  Yes Sherlene Shamseresa L Tullo, MD  Menthol, Topical Analgesic, (BIOFREEZE EX) Apply topically.   Yes Historical Provider, MD  traMADol (ULTRAM) 50 MG tablet Take 2 tablets (100 mg total) by mouth every 6 (six) hours as needed. for pain 10/26/15  Yes Sherlene Shamseresa  L Tullo, MD  valsartan (DIOVAN) 160 MG tablet TAKE 1 TABLET BY MOUTH ONCE DAILY 05/17/15  Yes Sherlene Shamseresa L Tullo, MD  doxycycline (VIBRAMYCIN) 100 MG capsule Take 1 capsule (100 mg total) by mouth 2 (two) times daily. For tick fever 10/26/15   Sherlene Shamseresa L Tullo, MD  predniSONE (DELTASONE) 10 MG tablet 6 tablets on Day 1 , then reduce by 1 tablet daily until gone 10/26/15   Sherlene Shamseresa L Tullo, MD  trimethoprim-polymyxin b (POLYTRIM) ophthalmic solution Place 1 drop into the right eye every 6 (six) hours. 01/27/16   Lucia EstelleFeng Nora Sabey, NP   Meds Ordered and Administered this Visit  Medications - No data to display  BP (!) 183/104 (BP Location: Left Arm)   Pulse 74   Temp 98.5 F (36.9 C) (Oral)   Resp 18   Ht 5\' 6"  (1.676 m)   Wt 184 lb (83.5 kg)   SpO2 99%   BMI 29.70 kg/m  No data found.   Physical Exam  Constitutional: She is oriented to person, place, and time. She appears well-developed and well-nourished.  HENT:  Head: Normocephalic.  Right Ear: External ear normal.  Left Ear: External ear normal.  TM pearly gray with no erythema  Eyes: Pupils are equal, round, and reactive to light.  Right eye with redness but no swelling. Noted to have a yellow-green circular dot approximately 1mm x 1mm at the right upper outer iris, when stained with fluorescein and viewed with the wood's lamp. Right eye vision is also impaired per visual acuity  Neck: Normal range of motion. Neck supple.  Cardiovascular: Normal rate and regular rhythm.   Pulmonary/Chest: Effort normal and breath sounds normal.  Lymphadenopathy:    She has no cervical adenopathy.  Neurological: She is alert and oriented to person, place, and time.  Skin: Skin is warm and dry.  Psychiatric: She has a normal mood and affect.  Nursing note and vitals reviewed.   Urgent Care Course   Clinical Course    Procedures (including critical care time)  Labs Review Labs Reviewed - No data to display  Imaging Review No results  found.   Visual Acuity Review  Right Eye Distance: 20/70 Left Eye Distance: 20/20 Bilateral Distance: 20/20     MDM   1. Corneal abrasion, right, initial encounter    Exam finding consistent with corneal abrasion. Prescriptions given (see above). Reviewed directions for usage and side effects. Patient states understanding and will call with questions or problems. Patient instructed to call or follow up with his/her primary care doctor if failure to improve or change in symptoms. Discharge instruction given. Patient denies any questions.    Lucia EstelleFeng Neola Worrall, NP 01/27/16 1102

## 2016-01-30 ENCOUNTER — Other Ambulatory Visit: Payer: Self-pay | Admitting: Internal Medicine

## 2016-03-21 ENCOUNTER — Other Ambulatory Visit: Payer: Self-pay | Admitting: Internal Medicine

## 2016-03-21 ENCOUNTER — Encounter: Payer: Self-pay | Admitting: Internal Medicine

## 2016-03-21 DIAGNOSIS — E785 Hyperlipidemia, unspecified: Secondary | ICD-10-CM

## 2016-03-21 DIAGNOSIS — E669 Obesity, unspecified: Secondary | ICD-10-CM

## 2016-03-21 DIAGNOSIS — E1169 Type 2 diabetes mellitus with other specified complication: Secondary | ICD-10-CM

## 2016-03-21 DIAGNOSIS — R5383 Other fatigue: Secondary | ICD-10-CM

## 2016-03-21 DIAGNOSIS — I10 Essential (primary) hypertension: Secondary | ICD-10-CM

## 2016-03-21 DIAGNOSIS — Z1231 Encounter for screening mammogram for malignant neoplasm of breast: Secondary | ICD-10-CM

## 2016-03-21 NOTE — Telephone Encounter (Signed)
Fasting labs ordered.  Please arrange lab appt for patient

## 2016-03-25 NOTE — Telephone Encounter (Signed)
Norco and prednisone last refilled 10/26/15. See note attached from patient.  Last OV 10/26/15. Has follow up 05/16/16.

## 2016-03-26 NOTE — Telephone Encounter (Signed)
REFILL ON VICODIN AND PREDNISONE  DENIED UNTIL SHE IS SEEN.  3 month follow up for NARCOTICS refills   per new controlled substance policy.  Needs 15 minute appt for  pain management  ONLY SO YOU CAN ADD .

## 2016-03-28 ENCOUNTER — Other Ambulatory Visit: Payer: Self-pay | Admitting: Internal Medicine

## 2016-03-28 ENCOUNTER — Other Ambulatory Visit: Payer: Self-pay | Admitting: Physician Assistant

## 2016-03-28 DIAGNOSIS — J069 Acute upper respiratory infection, unspecified: Secondary | ICD-10-CM

## 2016-03-28 NOTE — Telephone Encounter (Signed)
Last filled 02/28/16. Last OV 10/26/15. Has appt on 05/16/16.

## 2016-03-29 NOTE — Telephone Encounter (Signed)
Rx faxed

## 2016-04-02 ENCOUNTER — Other Ambulatory Visit: Payer: Self-pay | Admitting: Internal Medicine

## 2016-04-02 MED ORDER — PREDNISONE 10 MG PO TABS: ORAL_TABLET | ORAL | 0 refills | Status: DC

## 2016-04-02 NOTE — Telephone Encounter (Signed)
Norco refills require follow up every 3 months.  This is not a new policy .

## 2016-04-02 NOTE — Telephone Encounter (Signed)
Prednisone and Norco both filled on 10/26/15. Last OV 10/26/15. Has follow up visit on 05/16/16.

## 2016-04-08 ENCOUNTER — Ambulatory Visit (INDEPENDENT_AMBULATORY_CARE_PROVIDER_SITE_OTHER): Payer: 59 | Admitting: Cardiovascular Disease

## 2016-04-08 ENCOUNTER — Encounter: Payer: Self-pay | Admitting: Cardiovascular Disease

## 2016-04-08 VITALS — BP 158/98 | HR 66 | Ht 66.5 in | Wt 186.8 lb

## 2016-04-08 DIAGNOSIS — I10 Essential (primary) hypertension: Secondary | ICD-10-CM

## 2016-04-08 DIAGNOSIS — Z8249 Family history of ischemic heart disease and other diseases of the circulatory system: Secondary | ICD-10-CM

## 2016-04-08 DIAGNOSIS — R011 Cardiac murmur, unspecified: Secondary | ICD-10-CM

## 2016-04-08 DIAGNOSIS — I350 Nonrheumatic aortic (valve) stenosis: Secondary | ICD-10-CM | POA: Diagnosis not present

## 2016-04-08 NOTE — Patient Instructions (Addendum)
Medication Instructions:   No medication changes made  Labwork:  No new labs needed  Testing/Procedures:  Research CT coronary calcium score  There is a one-time fee of $150 due at the time of your procedure Please call [phone] to schedule this [address], [place], [place]  Echocardiogram for aortic valve disease, murmur  I recommend watching educational videos on topics of interest to you at:       [email or web address]  Enter code: HEARTCARE    Follow-Up: It was a pleasure seeing you in the office today. Please call us if you have new issues that need to be addressed before your next appt.  [phone]  Your physician wants you to follow-up in: 12 months.  You will receive a reminder letter in the mail two months in advance. If you don't receive a letter, please call our office to schedule the follow-up appointment.  If you need a refill on your cardiac medications before your next appointment, please call your pharmacy.    Coronary Calcium Scan A coronary calcium scan is an imaging test used to look for deposits of calcium and other fatty materials (plaques) in the inner lining of the blood vessels of your heart (coronary arteries). These deposits of calcium and plaques can partly clog and narrow the coronary arteries without producing any symptoms or warning signs. This puts you at risk for a heart attack. This test can detect these deposits before symptoms develop.  LET [facility] PROVIDER KNOW ABOUT:  Any allergies you have.  All medicines you are taking, including vitamins, herbs, eye drops, creams, and over-the-counter medicines.  Previous problems you or members of your family have had with the use of anesthetics.  Any blood disorders you have.  Previous surgeries you have had.  Medical conditions you have.  Possibility of pregnancy, if this applies. RISKS AND COMPLICATIONS Generally, this is a safe procedure. However, as with any  procedure, complications can occur. This test involves the use of radiation. Radiation exposure can be dangerous to a pregnant woman and her unborn baby. If you are pregnant, you should not have this procedure done.  BEFORE THE PROCEDURE There is no special preparation for the procedure. PROCEDURE  You will need to undress and put on a hospital gown. You will need to remove any jewelry around your neck or chest.  Sticky electrodes are placed on your chest and are connected to an electrocardiogram (EKG or electrocardiography) machine to recorda tracing of the electrical activity of your heart.  A CT scanner will take pictures of your heart. During this time, you will be asked to lie still and hold your breath for 2-3 seconds while a picture is being taken of your heart. AFTER THE PROCEDURE   You will be allowed to get dressed.  You can return to your normal activities after the scan is done. This information is not intended to replace advice given to you by your health care provider. Make sure you discuss any questions you have with your health care provider. Document Released: 10/26/2007 Document Revised: 05/04/2013 Document Reviewed: 01/04/2013 Elsevier Interactive Patient Education  2017 Elsevier Inc. Echocardiogram An echocardiogram, or echocardiography, uses sound waves (ultrasound) to produce an image of your heart. The echocardiogram is simple, painless, obtained within a short period of time, and offers valuable information to your health care provider. The images from an echocardiogram can provide information such as:  Evidence of coronary artery disease (CAD).  Heart size.  Heart muscle function.  Heart valve function.  Aneurysm detection.  Evidence of a past heart attack.  Fluid buildup around the heart.  Heart muscle thickening.  Assess heart valve function. Tell a health care provider about:  Any allergies you have.  All medicines you are taking, including  vitamins, herbs, eye drops, creams, and over-the-counter medicines.  Any problems you or family members have had with anesthetic medicines.  Any blood disorders you have.  Any surgeries you have had.  Any medical conditions you have.  Whether you are pregnant or may be pregnant. What happens before the procedure? No special preparation is needed. Eat and drink normally. What happens during the procedure?  In order to produce an image of your heart, gel will be applied to your chest and a wand-like tool (transducer) will be moved over your chest. The gel will help transmit the sound waves from the transducer. The sound waves will harmlessly bounce off your heart to allow the heart images to be captured in real-time motion. These images will then be recorded.  You may need an IV to receive a medicine that improves the quality of the pictures. What happens after the procedure? You may return to your normal schedule including diet, activities, and medicines, unless your health care provider tells you otherwise. This information is not intended to replace advice given to you by your health care provider. Make sure you discuss any questions you have with your health care provider. Document Released: 04/26/2000 Document Revised: 12/16/2015 Document Reviewed: 01/04/2013 Elsevier Interactive Patient Education  2017 [employer]

## 2016-04-08 NOTE — Progress Notes (Signed)
Cardiology Office Note  Date:  04/08/2016   ID:  Courtney Lopez, DOB 04/29/1961, MRN [id number]  PCP:  [name], TERESA L, MD   Chief Complaint  Patient presents with  . other    12 month follow up. Meds reviewed by the pt. verbally. "doing well." Pt. c/o Blood pressure fluctuating all over the place.     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, civil (consulting)nurse at [employer] with history of murmur, aortic valve disease, hypertension who presents For follow-up of her aortic valve and blood pressure  She reports having Labile blood pressures, Cutting HCTZ in 1/2 daily, BP running low at times Sometimes 80 systolic She takes her labetolol HCTZ at night (works night) Takes labetolol diovan morning 6 am, before sleep  BP drops sometimes day and night BP at work: 124/70 typically Blood pressure elevated today, etiology unclear Otherwise very active, no complaints Interested in reevaluating her aortic valve with echocardiogram We did request previous echocardiograms but were unsuccessful in obtaining copies  Periodically having left arm pain, she feels it is likely from her neck, radiculopathy though always concerning for heart she feels.  EKG on today's visit shows normal sinus rhythm with rate 67 bpm, no significant ST or T wave changes  Other past medical history  diagnosis of murmur and aortic valve disease was made as she was pregnant with her third child. Murmur persisted even after delivery. She has been seen by Dr. Juliann Paresallwood for many years with periodic echocardiogram. She has never been told it is bicuspid or tricuspid valve.   Patient has never smoked  She does report family history of SVT requiring ablation, father died at age 57 from cancer, mother with aortic valve stenosis but was in her 6080s   PMH:   has a past medical history of Aortic stenosis; Aortic valve disorders; Breast screening, unspecified; Cervicalgia; Heart murmur; Metatarsal fracture (2007); Other symptoms involving abdomen  and pelvis(789.9); Retinal hemorrhage; Symptomatic menopausal or female climacteric states; and Unspecified essential hypertension.  PSH:    Past Surgical History:  Procedure Laterality Date  . ABDOMINAL HYSTERECTOMY    . APPENDECTOMY     done during oophorectomy  . CESAREAN SECTION     x 2  . gyn surgery     hysterectomy- done for adenomysosis and endometriosis  . OOPHORECTOMY     unilateral, due to ruptured ovarian cysts  . OSTEOTOMY MAXILLARY     due to bite problems  . TUBAL LIGATION      Current Outpatient Prescriptions  Medication Sig Dispense Refill  . ALPRAZolam (XANAX) 0.25 MG tablet TAKE 1 TABLET BY MOUTH TWICE DAILY AS NEEDED 60 tablet 1  . aspirin 81 MG tablet Take 81 mg by mouth daily.    [name]. doxycycline (VIBRAMYCIN) 100 MG capsule Take 1 capsule (100 mg total) by mouth 2 (two) times daily. For tick fever 14 capsule 0  . ESTRACE VAGINAL 0.1 MG/GM vaginal cream INSERT 1/4 APPLICATORFUL VAGINALLY TWO TIMES A WEEK AS DIRECTED 42.5 g 5  . etodolac (LODINE) 500 MG tablet TAKE 1 TABLET BY MOUTH TWICE DAILY 60 tablet 3  . fluticasone (FLONASE) 50 MCG/ACT nasal spray PLACE 2 SPRAYS INTO BOTH NOSTRILS DAILY. 16 g 6  . hydrochlorothiazide (HYDRODIURIL) 25 MG tablet TAKE 1 TABLET BY MOUTH DAILY. 90 tablet 1  . HYDROcodone-acetaminophen (NORCO) 10-325 MG tablet Take 1 tablet by mouth every 8 (eight) hours as needed. 90 tablet 0  . labetalol (NORMODYNE) 200 MG tablet TAKE ONE TABLET BY MOUTH  2 TIMES A DAY 180 tablet 1  . Menthol, Topical Analgesic, (BIOFREEZE EX) Apply topically.    . traMADol (ULTRAM) 50 MG tablet Take 2 tablets (100 mg total) by mouth every 6 (six) hours as needed. for pain 240 tablet 3  . valsartan (DIOVAN) 160 MG tablet TAKE 1 TABLET BY MOUTH ONCE DAILY 90 tablet 2   No current facility-administered medications for this visit.      Allergies:   Prozac [fluoxetine hcl]   Social History:  The patient  reports that she has never smoked. She has never used  smokeless tobacco. She reports that she drinks alcohol. She reports that she does not use drugs.   Family History:   family history includes Aortic stenosis in her mother; Cancer in her father and maternal aunt; Diabetes in her mother; Melanoma in her son; Retinoblastoma in her son.    Review of Systems: Review of Systems  Constitutional: Negative.   Respiratory: Negative.   Cardiovascular: Negative.   Gastrointestinal: Negative.   Musculoskeletal: Negative.   Neurological: Negative.   Psychiatric/Behavioral: Negative.   All other systems reviewed and are negative.    PHYSICAL EXAM: VS:  BP (!) 158/98 (BP Location: Left Arm, Patient Position: Sitting, Cuff Size: Normal)   Pulse 66   Ht 5' 6.5" (1.689 m)   Wt 186 lb 12 oz (84.7 kg)   BMI 29.69 kg/m  , BMI Body mass index is 29.69 kg/m. GEN: Well nourished, well developed, in no acute distress  HEENT: normal  Neck: no JVD, carotid bruits, or masses Cardiac: RRR; 2+ murmur rsb, no rubs, or gallops,no edema  Respiratory:  clear to auscultation bilaterally, normal work of breathing GI: soft, nontender, nondistended, + BS MS: no deformity or atrophy  Skin: warm and dry, no rash Neuro:  Strength and sensation are intact Psych: euthymic mood, full affect    Recent Labs: 10/26/2015: ALT 20; BUN 25; Creatinine, Ser 0.83; Potassium 4.4; Sodium 137    Lipid Panel Lab Results  Component Value Date   CHOL 181 10/26/2015   HDL 53.80 10/26/2015   LDLCALC 100 (H) 10/26/2015   TRIG 138.0 10/26/2015      Wt Readings from Last 3 Encounters:  04/08/16 186 lb 12 oz (84.7 kg)  01/27/16 184 lb (83.5 kg)  10/26/15 179 lb 8 oz (81.4 kg)       ASSESSMENT AND PLAN:  Essential hypertension - Plan: EKG 12-Lead Long discussion concerning her labile blood pressure Sometimes low, sometimes elevated as on today's visit She does self-monitoring with reasonable results She continues to have labile numbers, recommended she call our  office Potentially could change labetalol to bystolic. She takes half HCTZ, Diovan She does have amlodipine at home, this was used only as needed Potentially could use amlodipine if she continues to have labile numbers  Aortic valve stenosis, etiology of cardiac valve disease unspecified - Plan: EKG 12-Lead Echocardiogram ordered at her request for evaluation of aortic valve disease, murmur Likely has bicuspid aortic valve with sclerosis without significant stenosis  Preventive health CT coronary calcium score ordered for family history, left arm pain   Total encounter time more than 25 minutes  Greater than 50% was spent in counseling and coordination of care with the patient   Disposition:   F/U  12 months  Orders Placed This Encounter  Procedures  . EKG 12-Lead     Signed, Dossie Arbourim Zonie Crutcher, M.D., Ph.D. 04/08/2016  [facility], [place] [phone]

## 2016-04-22 ENCOUNTER — Ambulatory Visit (INDEPENDENT_AMBULATORY_CARE_PROVIDER_SITE_OTHER)
Admission: RE | Admit: 2016-04-22 | Discharge: 2016-04-22 | Disposition: A | Discharge status: Home / Self Care | Payer: Self-pay | Source: Ambulatory Visit | Attending: Cardiovascular Disease | Admitting: Cardiovascular Disease

## 2016-04-22 DIAGNOSIS — I350 Nonrheumatic aortic (valve) stenosis: Secondary | ICD-10-CM

## 2016-04-22 DIAGNOSIS — Z8249 Family history of ischemic heart disease and other diseases of the circulatory system: Secondary | ICD-10-CM

## 2016-04-22 DIAGNOSIS — I10 Essential (primary) hypertension: Secondary | ICD-10-CM

## 2016-04-22 DIAGNOSIS — R011 Cardiac murmur, unspecified: Secondary | ICD-10-CM

## 2016-04-23 ENCOUNTER — Other Ambulatory Visit: Payer: Self-pay | Admitting: Internal Medicine

## 2016-04-24 ENCOUNTER — Ambulatory Visit
Admission: RE | Admit: 2016-04-24 | Discharge: 2016-04-24 | Disposition: A | Discharge status: Home / Self Care | Payer: 59 | Source: Ambulatory Visit | Attending: Internal Medicine | Admitting: Internal Medicine

## 2016-04-24 ENCOUNTER — Other Ambulatory Visit: Payer: Self-pay | Admitting: Internal Medicine

## 2016-04-24 DIAGNOSIS — Z1231 Encounter for screening mammogram for malignant neoplasm of breast: Secondary | ICD-10-CM | POA: Diagnosis not present

## 2016-04-25 ENCOUNTER — Other Ambulatory Visit: Payer: Self-pay

## 2016-04-25 MED ORDER — ROSUVASTATIN CALCIUM 10 MG PO TABS: 10.0000 mg | ORAL_TABLET | Freq: Every day | ORAL | 3 refills | Status: DC

## 2016-05-03 ENCOUNTER — Other Ambulatory Visit: Payer: Self-pay

## 2016-05-03 ENCOUNTER — Ambulatory Visit (INDEPENDENT_AMBULATORY_CARE_PROVIDER_SITE_OTHER): Payer: 59

## 2016-05-03 ENCOUNTER — Ambulatory Visit: Payer: Self-pay | Admitting: Internal Medicine

## 2016-05-03 DIAGNOSIS — R011 Cardiac murmur, unspecified: Secondary | ICD-10-CM | POA: Diagnosis not present

## 2016-05-03 DIAGNOSIS — I350 Nonrheumatic aortic (valve) stenosis: Secondary | ICD-10-CM

## 2016-05-03 DIAGNOSIS — I10 Essential (primary) hypertension: Secondary | ICD-10-CM | POA: Diagnosis not present

## 2016-05-07 ENCOUNTER — Other Ambulatory Visit: Payer: Self-pay

## 2016-05-09 ENCOUNTER — Other Ambulatory Visit: Payer: Self-pay | Admitting: Cardiovascular Disease

## 2016-05-09 ENCOUNTER — Other Ambulatory Visit: Payer: Self-pay

## 2016-05-09 DIAGNOSIS — I77810 Thoracic aortic ectasia: Secondary | ICD-10-CM

## 2016-05-10 ENCOUNTER — Other Ambulatory Visit (INDEPENDENT_AMBULATORY_CARE_PROVIDER_SITE_OTHER): Payer: 59

## 2016-05-10 ENCOUNTER — Telehealth: Payer: Self-pay | Admitting: Cardiovascular Disease

## 2016-05-10 DIAGNOSIS — E669 Obesity, unspecified: Secondary | ICD-10-CM | POA: Diagnosis not present

## 2016-05-10 DIAGNOSIS — R5383 Other fatigue: Secondary | ICD-10-CM

## 2016-05-10 DIAGNOSIS — E1169 Type 2 diabetes mellitus with other specified complication: Secondary | ICD-10-CM | POA: Diagnosis not present

## 2016-05-10 DIAGNOSIS — I10 Essential (primary) hypertension: Secondary | ICD-10-CM

## 2016-05-10 DIAGNOSIS — E785 Hyperlipidemia, unspecified: Secondary | ICD-10-CM

## 2016-05-10 LAB — CBC WITH DIFFERENTIAL/PLATELET
Basophils Absolute: 0 10*3/uL (ref 0.0–0.1)
Basophils Relative: 0.2 % (ref 0.0–3.0)
Eosinophils Absolute: 0.2 10*3/uL (ref 0.0–0.7)
Eosinophils Relative: 3.2 % (ref 0.0–5.0)
HCT: 37.7 % (ref 36.0–46.0)
Hemoglobin: 12.9 g/dL (ref 12.0–15.0)
Lymphocytes Relative: 39.8 % (ref 12.0–46.0)
Lymphs Abs: 2.6 10*3/uL (ref 0.7–4.0)
MCHC: 34.2 g/dL (ref 30.0–36.0)
MCV: 85.2 fl (ref 78.0–100.0)
Monocytes Absolute: 0.6 10*3/uL (ref 0.1–1.0)
Monocytes Relative: 9.3 % (ref 3.0–12.0)
Neutro Abs: 3.1 10*3/uL (ref 1.4–7.7)
Neutrophils Relative %: 47.5 % (ref 43.0–77.0)
Platelets: 270 10*3/uL (ref 150.0–400.0)
RBC: 4.43 Mil/uL (ref 3.87–5.11)
RDW: 12.9 % (ref 11.5–15.5)
WBC: 6.4 10*3/uL (ref 4.0–10.5)

## 2016-05-10 LAB — MICROALBUMIN / CREATININE URINE RATIO
Creatinine,U: 443 mg/dL
Microalb Creat Ratio: 0.9 mg/g (ref 0.0–30.0)
Microalb, Ur: 4.1 mg/dL — ABNORMAL HIGH (ref 0.0–1.9)

## 2016-05-10 LAB — LIPID PANEL
Cholesterol: 118 mg/dL (ref 0–200)
HDL: 56 mg/dL (ref >39.00)
LDL Cholesterol: 48 mg/dL (ref 0–99)
NonHDL: 61.69
Total CHOL/HDL Ratio: 2
Triglycerides: 70 mg/dL (ref 0.0–149.0)
VLDL: 14 mg/dL (ref 0.0–40.0)

## 2016-05-10 LAB — COMPREHENSIVE METABOLIC PANEL
ALT: 23 U/L (ref 0–35)
AST: 26 U/L (ref 0–37)
Albumin: 4.3 g/dL (ref 3.5–5.2)
Alkaline Phosphatase: 60 U/L (ref 39–117)
BUN: 15 mg/dL (ref 6–23)
CO2: 33 mEq/L — ABNORMAL HIGH (ref 19–32)
Calcium: 8.9 mg/dL (ref 8.4–10.5)
Chloride: 102 mEq/L (ref 96–112)
Creatinine, Ser: 0.76 mg/dL (ref 0.40–1.20)
GFR: 83.28 mL/min (ref >60.00)
Glucose, Bld: 131 mg/dL — ABNORMAL HIGH (ref 70–99)
Potassium: 3.9 mEq/L (ref 3.5–5.1)
Sodium: 140 mEq/L (ref 135–145)
Total Bilirubin: 0.5 mg/dL (ref 0.2–1.2)
Total Protein: 6.5 g/dL (ref 6.0–8.3)

## 2016-05-10 LAB — LDL CHOLESTEROL, DIRECT: Direct LDL: 50 mg/dL

## 2016-05-10 LAB — TSH: TSH: 2.48 u[IU]/mL (ref 0.35–4.50)

## 2016-05-10 LAB — HEMOGLOBIN A1C: Hgb A1c MFr Bld: 6.1 % (ref 4.6–6.5)

## 2016-05-10 NOTE — Telephone Encounter (Signed)
Please have Gollan sign off on order for ct angio chest pe protocol.

## 2016-05-10 NOTE — Telephone Encounter (Addendum)
Can you sign her orders for her Chest CTA?   She is sched for Wed 1/3 @ 8:30.

## 2016-05-13 ENCOUNTER — Encounter: Payer: Self-pay | Admitting: Internal Medicine

## 2016-05-15 ENCOUNTER — Ambulatory Visit
Admission: RE | Admit: 2016-05-15 | Discharge: 2016-05-15 | Disposition: A | Discharge status: Home / Self Care | Payer: 59 | Source: Ambulatory Visit | Attending: Cardiovascular Disease | Admitting: Cardiovascular Disease

## 2016-05-15 DIAGNOSIS — I77810 Thoracic aortic ectasia: Secondary | ICD-10-CM | POA: Insufficient documentation

## 2016-05-15 DIAGNOSIS — R938 Abnormal findings on diagnostic imaging of other specified body structures: Secondary | ICD-10-CM | POA: Diagnosis not present

## 2016-05-15 MED ORDER — IOPAMIDOL (ISOVUE-370) INJECTION 76%
75.0000 mL | Freq: Once | INTRAVENOUS | Status: AC | PRN
Start: 2016-05-15 — End: 2016-05-15
  Administered 2016-05-15: 75 mL via INTRAVENOUS

## 2016-05-16 ENCOUNTER — Ambulatory Visit (INDEPENDENT_AMBULATORY_CARE_PROVIDER_SITE_OTHER): Payer: 59 | Admitting: Internal Medicine

## 2016-05-16 ENCOUNTER — Encounter: Payer: Self-pay | Admitting: Internal Medicine

## 2016-05-16 VITALS — BP 122/86 | HR 71 | Temp 98.7°F | Resp 16 | Ht 67.0 in | Wt 182.1 lb

## 2016-05-16 DIAGNOSIS — E785 Hyperlipidemia, unspecified: Secondary | ICD-10-CM

## 2016-05-16 DIAGNOSIS — E1169 Type 2 diabetes mellitus with other specified complication: Secondary | ICD-10-CM | POA: Diagnosis not present

## 2016-05-16 DIAGNOSIS — I350 Nonrheumatic aortic (valve) stenosis: Secondary | ICD-10-CM

## 2016-05-16 DIAGNOSIS — M489 Spondylopathy, unspecified: Secondary | ICD-10-CM

## 2016-05-16 DIAGNOSIS — Z79899 Other long term (current) drug therapy: Secondary | ICD-10-CM | POA: Diagnosis not present

## 2016-05-16 DIAGNOSIS — Z23 Encounter for immunization: Secondary | ICD-10-CM

## 2016-05-16 DIAGNOSIS — E119 Type 2 diabetes mellitus without complications: Secondary | ICD-10-CM

## 2016-05-16 DIAGNOSIS — E669 Obesity, unspecified: Secondary | ICD-10-CM

## 2016-05-16 DIAGNOSIS — I10 Essential (primary) hypertension: Secondary | ICD-10-CM

## 2016-05-16 DIAGNOSIS — I77810 Thoracic aortic ectasia: Secondary | ICD-10-CM

## 2016-05-16 DIAGNOSIS — E663 Overweight: Secondary | ICD-10-CM

## 2016-05-16 MED ORDER — TRAMADOL HCL 50 MG PO TABS: 100.0000 mg | ORAL_TABLET | Freq: Four times a day (QID) | ORAL | 5 refills | Status: DC | PRN

## 2016-05-16 MED ORDER — HYDROCODONE-ACETAMINOPHEN 10-325 MG PO TABS: 1.0000 | ORAL_TABLET | Freq: Three times a day (TID) | ORAL | 0 refills | Status: DC | PRN

## 2016-05-16 NOTE — Progress Notes (Signed)
Pre-visit discussion using our clinic review tool. No additional management support is needed unless otherwise documented below in the visit note.  

## 2016-05-16 NOTE — Progress Notes (Signed)
Subjective:  Patient ID: Courtney Lopez, female    DOB: 1961/04/14  Age: 56 y.o. MRN: [id number]  CC: The primary encounter diagnosis was Hyperlipidemia with target LDL less than 100. Diagnoses of Long-term current use of high risk medication other than anticoagulant, Need for prophylactic vaccination against Streptococcus pneumoniae (pneumococcus), Diabetes mellitus type 2 in obese [facility]), Aortic valve stenosis, etiology of cardiac valve disease unspecified, Essential hypertension, Overweight, Cervical spine disease, and Mild ascending aorta dilation (HCC) were also pertinent to this visit.  HPI: Courtney Lopez presents for follow up on type 2 DM and other issues.  Since her last visit she was evaluated by [facility] with cardiac CT for risk stratification given her FH and recurrent left arm pain. [name]  Her score was 78, so she agreed to start taking crestor, which has resulted in myalgias, but not severe. She was also noted to have a bicuspid aortic valve, and fusiform dilation of the aorta to 4.6 cm  Which is stable by  CT angiogram.  Hypertension:  She has had labile readings complicated by recurrent hypotension with full dose (25 mg ) of hctz.   Neck pain:  She is concerned about using steroids for cervical disk disease    DM type 2:she does not check blood sugars regularly and is following a low gi diet,  Not exercising.   Lab Results  Component Value Date   HGBA1C 6.1 05/10/2016     Outpatient Medications Prior to Visit  Medication Sig Dispense Refill  . ALPRAZolam (XANAX) 0.25 MG tablet TAKE 1 TABLET BY MOUTH TWICE DAILY AS NEEDED 60 tablet 1  . aspirin 81 MG tablet Take 81 mg by mouth daily.    [name] doxycycline (VIBRAMYCIN) 100 MG capsule Take 1 capsule (100 mg total) by mouth 2 (two) times daily. For tick fever 14 capsule 0  . ESTRACE VAGINAL 0.1 MG/GM vaginal cream INSERT 1/4 APPLICATORFUL VAGINALLY TWO TIMES A WEEK AS DIRECTED 42.5 g 5  . fluticasone (FLONASE) 50 MCG/ACT nasal  spray PLACE 2 SPRAYS INTO BOTH NOSTRILS DAILY. 16 g 6  . hydrochlorothiazide (HYDRODIURIL) 25 MG tablet TAKE 1 TABLET BY MOUTH DAILY. 90 tablet 1  . labetalol (NORMODYNE) 200 MG tablet TAKE ONE TABLET BY MOUTH 2 TIMES A DAY 180 tablet 1  . Menthol, Topical Analgesic, (BIOFREEZE EX) Apply topically.    . rosuvastatin (CRESTOR) 10 MG tablet Take 1 tablet (10 mg total) by mouth daily. 90 tablet 3  . valsartan (DIOVAN) 160 MG tablet TAKE 1 TABLET BY MOUTH ONCE DAILY 90 tablet 2  . etodolac (LODINE) 500 MG tablet TAKE 1 TABLET BY MOUTH TWICE DAILY 60 tablet 3  . HYDROcodone-acetaminophen (NORCO) 10-325 MG tablet Take 1 tablet by mouth every 8 (eight) hours as needed. 90 tablet 0  . traMADol (ULTRAM) 50 MG tablet Take 2 tablets (100 mg total) by mouth every 6 (six) hours as needed. for pain 240 tablet 3   No facility-administered medications prior to visit.     Review of Systems;  Patient denies headache, fevers, malaise, unintentional weight loss, skin rash, eye pain, sinus congestion and sinus pain, sore throat, dysphagia,  hemoptysis , cough, dyspnea, wheezing, chest pain, palpitations, orthopnea, edema, abdominal pain, nausea, melena, diarrhea, constipation, flank pain, dysuria, hematuria, urinary  Frequency, nocturia, numbness, tingling, seizures,  Focal weakness, Loss of consciousness,  Tremor, insomnia, and suicidal ideation.      Objective:  BP 122/86   Pulse 71   Temp 98.7 F (37.1 C) (  Oral)   Resp 16   Ht 5\' 7"  (1.702 m)   Wt 182 lb 2 oz (82.6 kg)   SpO2 95%   BMI 28.52 kg/m   BP Readings from Last 3 Encounters:  05/16/16 122/86  04/08/16 (!) 158/98  01/27/16 (!) 183/104    Wt Readings from Last 3 Encounters:  05/16/16 182 lb 2 oz (82.6 kg)  04/08/16 186 lb 12 oz (84.7 kg)  01/27/16 184 lb (83.5 kg)    General appearance: alert, cooperative and appears stated age Ears: normal TM's and external ear canals both ears Throat: lips, mucosa, and tongue normal; teeth and  gums normal Neck: no adenopathy, no carotid bruit, supple, symmetrical, trachea midline and thyroid not enlarged, symmetric, no tenderness/mass/nodules Back: symmetric, no curvature. ROM normal. No CVA tenderness. Lungs: clear to auscultation bilaterally Heart: regular rate and rhythm, S1, S2 normal, no murmur, click, rub or gallop Abdomen: soft, non-tender; bowel sounds normal; no masses,  no organomegaly Pulses: 2+ and symmetric Skin: Skin color, texture, turgor normal. No rashes or lesions Lymph nodes: Cervical, supraclavicular, and axillary nodes normal.  Lab Results  Component Value Date   HGBA1C 6.1 05/10/2016   HGBA1C 6.1 10/26/2015   HGBA1C 6.6 (H) 04/21/2015    Lab Results  Component Value Date   CREATININE 0.76 05/10/2016   CREATININE 0.83 10/26/2015   CREATININE 0.86 04/21/2015    Lab Results  Component Value Date   WBC 6.4 05/10/2016   HGB 12.9 05/10/2016   HCT 37.7 05/10/2016   PLT 270.0 05/10/2016   GLUCOSE 131 (H) 05/10/2016   CHOL 118 05/10/2016   TRIG 70.0 05/10/2016   HDL 56.00 05/10/2016   LDLDIRECT 50.0 05/10/2016   LDLCALC 48 05/10/2016   ALT 23 05/10/2016   AST 26 05/10/2016   NA 140 05/10/2016   K 3.9 05/10/2016   CL 102 05/10/2016   CREATININE 0.76 05/10/2016   BUN 15 05/10/2016   CO2 33 (H) 05/10/2016   TSH 2.48 05/10/2016   HGBA1C 6.1 05/10/2016   MICROALBUR 4.1 (H) 05/10/2016    Ct Angio Chest Aorta W/cm &/or Wo/cm  Result Date: 05/15/2016 CLINICAL DATA:  Prominent ascending aorta noted on CT for calcium scoring, followup EXAM: CT ANGIOGRAPHY CHEST WITH CONTRAST TECHNIQUE: Multidetector CT imaging of the chest was performed using the standard protocol during bolus administration of intravenous contrast. Multiplanar CT image reconstructions and MIPs were obtained to evaluate the vascular anatomy. CONTRAST:  75 cc Isovue 370 COMPARISON:  None. FINDINGS: Cardiovascular: By history the fusiformly dilated ascending aorta on prior calcium scoring  study measured 4.6 cm. On image 46 series 40 the mid ascending thoracic aorta measures 4.6 cm, being stable. The aortic arch and descending thoracic aorta are normal in caliber. The origins of the great vessels appear patent. The heart is within upper limits of normal. No pericardial effusion is seen. Mediastinum/Nodes: No mediastinal or hilar adenopathy is noted. The pulmonary arteries are not well opacified but no central abnormality is seen. The thyroid gland is unremarkable. There may be a small hiatal hernia present Lungs/Pleura: On lung window images, no lung parenchymal abnormality is seen. No pneumonia is noted and there is no evidence of nodule. No pleural effusion is seen. The central airway is patent. Upper Abdomen: The portion of the upper abdomen that is visualized is unremarkable. Musculoskeletal: The thoracic vertebrae are normal alignment with mild degenerative change. No compression deformity is seen. Review of the MIP images confirms the above findings. IMPRESSION: 1. Stable fusiform  dilatation of the ascending aorta measuring 4.6 cm in diameter, identical to prior outside measurement from CT Cardiac scoring. 2. Question small hiatal hernia Electronically Signed   By: Dwyane Dee M.D.   On: 05/15/2016 09:25    Assessment & Plan:   Problem List Items Addressed This Visit    Aortic stenosis    She is asymptomatic. She has a bicuspid valve by recent echo       Cervical spine disease    Chronic , with cervical stenosis and  recent exacerbation causing radiculopathy.  Reassure her that period infrequent use of steroid taper is unlikley to result in long term complications.    vicodin refilled for daily use          Diabetes mellitus type 2 in obese (HCC)     Historically well-controlled on diet alone .  hemoglobin A1c has been consistently at or  less than 7.0 . Patient is up-to-date on eye exams and foot exam is normal today. Patient has early microalbuminuria. Patient is tolerating  statin therapy for CAD risk reduction and on ACE/ARB for renal protection and hypertension   Lab Results  Component Value Date   HGBA1C 6.1 05/10/2016   Lab Results  Component Value Date   MICROALBUR 4.1 (H) 05/10/2016         Hyperlipidemia with target LDL less than 100 - Primary   Relevant Orders   Lipid panel   Hypertension    Well controlled on current regimen.  Attempts to lower more have resulted in hypotension . Renal function stable, no changes today.  Lab Results  Component Value Date   CREATININE 0.76 05/10/2016   Lab Results  Component Value Date   NA 140 05/10/2016   K 3.9 05/10/2016   CL 102 05/10/2016   CO2 33 (H) 05/10/2016   Lab Results  Component Value Date   MICROALBUR 4.1 (H) 05/10/2016         Mild ascending aorta dilation (HCC)    4.6 cm,  Stable by CT angio,  Annual follow up recommended.      Overweight    I have addressed  BMI and recommended a low glycemic index diet utilizing smaller more frequent meals to increase metabolism.  I have also recommended that patient start exercising with a goal of 30 minutes of aerobic exercise a minimum of 5 days per week. Screening for lipid disorders, thyroid and diabetes to be done today.         Other Visit Diagnoses    Long-term current use of high risk medication other than anticoagulant       Relevant Orders   Comprehensive metabolic panel   Need for prophylactic vaccination against Streptococcus pneumoniae (pneumococcus)       Relevant Orders   Pneumococcal polysaccharide vaccine 23-valent greater than or equal to 2yo subcutaneous/IM (Completed)    A total of 25 minutes of face to face time was spent with patient more than half of which was spent in counselling about the above mentioned conditions  and coordination of care  I have discontinued Ms. Penson's etodolac. I am also having her maintain her aspirin, (Menthol, Topical Analgesic, (BIOFREEZE EX)), doxycycline, ESTRACE VAGINAL,  labetalol, valsartan, fluticasone, ALPRAZolam, hydrochlorothiazide, rosuvastatin, traMADol, and HYDROcodone-acetaminophen.  Meds ordered this encounter  Medications  . traMADol (ULTRAM) 50 MG tablet    Sig: Take 2 tablets (100 mg total) by mouth every 6 (six) hours as needed. for pain    Dispense:  180 tablet  Refill:  5    Keep on file for future refills  . HYDROcodone-acetaminophen (NORCO) 10-325 MG tablet    Sig: Take 1 tablet by mouth every 8 (eight) hours as needed.    Dispense:  90 tablet    Refill:  0    Medications Discontinued During This Encounter  Medication Reason  . etodolac (LODINE) 500 MG tablet   . traMADol (ULTRAM) 50 MG tablet Reorder  . HYDROcodone-acetaminophen (NORCO) 10-325 MG tablet Reorder    Follow-up: Return in about 6 months (around 11/13/2016) for follow up diabetes, fastin glabs end of [place] .   Sherlene Shams, MD

## 2016-05-16 NOTE — Patient Instructions (Signed)
If you stay on the crestor,  We will need to repeat your liver enzymes after 6 weeks of therapy (end of January) I have ordered fasting labs to  Be done around that time

## 2016-05-18 NOTE — Assessment & Plan Note (Signed)
She is asymptomatic. She has a bicuspid valve by recent echo

## 2016-05-18 NOTE — Assessment & Plan Note (Signed)
Chronic , with cervical stenosis and  recent exacerbation causing radiculopathy.  Reassure her that period infrequent use of steroid taper is unlikley to result in long term complications.    vicodin refilled for daily use

## 2016-05-18 NOTE — Assessment & Plan Note (Signed)
I have addressed  BMI and recommended a low glycemic index diet utilizing smaller more frequent meals to increase metabolism.  I have also recommended that patient start exercising with a goal of 30 minutes of aerobic exercise a minimum of 5 days per week. Screening for lipid disorders, thyroid and diabetes to be done today.   

## 2016-05-18 NOTE — Assessment & Plan Note (Signed)
4.6 cm,  Stable by CT angio,  Annual follow up recommended.

## 2016-05-18 NOTE — Assessment & Plan Note (Signed)
Well controlled on current regimen.  Attempts to lower more have resulted in hypotension . Renal function stable, no changes today.  Lab Results  Component Value Date   CREATININE 0.76 05/10/2016   Lab Results  Component Value Date   NA 140 05/10/2016   K 3.9 05/10/2016   CL 102 05/10/2016   CO2 33 (H) 05/10/2016   Lab Results  Component Value Date   MICROALBUR 4.1 (H) 05/10/2016

## 2016-05-18 NOTE — Assessment & Plan Note (Addendum)
Historically well-controlled on diet alone .  hemoglobin A1c has been consistently at or  less than 7.0 . Patient is up-to-date on eye exams and foot exam is normal today. Patient has early microalbuminuria. Patient is tolerating statin therapy for CAD risk reduction and on ACE/ARB for renal protection and hypertension   Lab Results  Component Value Date   HGBA1C 6.1 05/10/2016   Lab Results  Component Value Date   MICROALBUR 4.1 (H) 05/10/2016

## 2016-05-23 ENCOUNTER — Other Ambulatory Visit: Payer: Self-pay | Admitting: Internal Medicine

## 2016-05-23 NOTE — Telephone Encounter (Signed)
Last filled 03/29/16 60 1rf

## 2016-05-27 ENCOUNTER — Ambulatory Visit (INDEPENDENT_AMBULATORY_CARE_PROVIDER_SITE_OTHER): Payer: 59 | Admitting: Cardiovascular Disease

## 2016-05-27 ENCOUNTER — Encounter: Payer: Self-pay | Admitting: Cardiovascular Disease

## 2016-05-27 ENCOUNTER — Telehealth: Payer: Self-pay | Admitting: Cardiovascular Disease

## 2016-05-27 VITALS — BP 140/96 | HR 68 | Ht 66.5 in | Wt 183.0 lb

## 2016-05-27 DIAGNOSIS — I10 Essential (primary) hypertension: Secondary | ICD-10-CM | POA: Diagnosis not present

## 2016-05-27 DIAGNOSIS — E1169 Type 2 diabetes mellitus with other specified complication: Secondary | ICD-10-CM

## 2016-05-27 DIAGNOSIS — I77810 Thoracic aortic ectasia: Secondary | ICD-10-CM | POA: Diagnosis not present

## 2016-05-27 DIAGNOSIS — R079 Chest pain, unspecified: Secondary | ICD-10-CM | POA: Diagnosis not present

## 2016-05-27 DIAGNOSIS — Q231 Congenital insufficiency of aortic valve: Secondary | ICD-10-CM

## 2016-05-27 DIAGNOSIS — E669 Obesity, unspecified: Secondary | ICD-10-CM

## 2016-05-27 DIAGNOSIS — R011 Cardiac murmur, unspecified: Secondary | ICD-10-CM

## 2016-05-27 NOTE — Telephone Encounter (Signed)
Dr. Mariah MillingGollan had a cancellation @ 9:20. Pt added on to see him.

## 2016-05-27 NOTE — Progress Notes (Signed)
Cardiology Office Note  Date:  05/27/2016   ID:  Courtney Lopez, DOB 04/22/1961, MRN [id number]  PCP:  [name], TERESA L, MD   Chief Complaint  Patient presents with  . other    Pt. c/o chest pain as well as pain in her back. Meds reviewed by the pt. verbally.     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, civil (consulting)nurse at [employer] with history of murmur, aortic valve disease, hypertension who presents For follow-up of her Bicuspid aortic valve, dilated ascending aorta and hypertension  Recent echocardiogram, CT scan for dilated ascending aorta Echocardiogram suggested bicuspid aortic valve CT scan Confirming 4.6 cm dilated ascending aorta , arch normal size  Results discussed with her in detail today  Very low CT coronary calcium score    last night she had Chest pain on left side, made her concerned about her aorta Symptoms sound musculoskeletal, hurt on palpation, movement of her arm Typically has pain in her paravertebral muscles  Anxious today but in general blood pressure running within a reasonable range  On a prior office visit she reported  having left arm pain, likely from her neck, radiculopathy   EKG on today's visit shows normal sinus rhythm with rate 68 bpm,  left axis deviation, no significant ST or T wave changes  Other past medical history  diagnosis of murmur and aortic valve disease was made as she was pregnant with her third child. Murmur persisted even after delivery. She has been seen by Dr. Juliann Paresallwood for many years with periodic echocardiogram. She has never been told it is bicuspid or tricuspid valve.   Patient has never smoked  She does report family history of SVT requiring ablation, father died at age 58 from cancer, mother with aortic valve stenosis but was in her 4780s   PMH:   has a past medical history of Aortic stenosis; Aortic valve disorders; Breast screening, unspecified; Cervicalgia; Heart murmur; Metatarsal fracture (2007); Other symptoms involving  abdomen and pelvis(789.9); Retinal hemorrhage; Symptomatic menopausal or female climacteric states; and Unspecified essential hypertension.  PSH:    Past Surgical History:  Procedure Laterality Date  . ABDOMINAL HYSTERECTOMY    . APPENDECTOMY     done during oophorectomy  . CESAREAN SECTION     x 2  . gyn surgery     hysterectomy- done for adenomysosis and endometriosis  . OOPHORECTOMY     unilateral, due to ruptured ovarian cysts  . OSTEOTOMY MAXILLARY     due to bite problems  . TUBAL LIGATION      Current Outpatient Prescriptions  Medication Sig Dispense Refill  . ALPRAZolam (XANAX) 0.25 MG tablet TAKE ONE TABLET BY MOUTH 2 TIMES A DAY AS NEEDED 60 tablet 4  . aspirin 81 MG tablet Take 81 mg by mouth daily.    [name]. doxycycline (VIBRAMYCIN) 100 MG capsule Take 1 capsule (100 mg total) by mouth 2 (two) times daily. For tick fever 14 capsule 0  . ESTRACE VAGINAL 0.1 MG/GM vaginal cream INSERT 1/4 APPLICATORFUL VAGINALLY TWO TIMES A WEEK AS DIRECTED 42.5 g 5  . fluticasone (FLONASE) 50 MCG/ACT nasal spray PLACE 2 SPRAYS INTO BOTH NOSTRILS DAILY. 16 g 6  . hydrochlorothiazide (HYDRODIURIL) 25 MG tablet TAKE 1 TABLET BY MOUTH DAILY. 90 tablet 1  . HYDROcodone-acetaminophen (NORCO) 10-325 MG tablet Take 1 tablet by mouth every 8 (eight) hours as needed. 90 tablet 0  . labetalol (NORMODYNE) 200 MG tablet TAKE ONE TABLET BY MOUTH 2 TIMES A DAY 180  tablet 1  . Menthol, Topical Analgesic, (BIOFREEZE EX) Apply topically.    . rosuvastatin (CRESTOR) 10 MG tablet Take 1 tablet (10 mg total) by mouth daily. 90 tablet 3  . traMADol (ULTRAM) 50 MG tablet Take 2 tablets (100 mg total) by mouth every 6 (six) hours as needed. for pain 180 tablet 5  . valsartan (DIOVAN) 160 MG tablet TAKE 1 TABLET BY MOUTH ONCE DAILY 90 tablet 2   No current facility-administered medications for this visit.      Allergies:   Prozac [fluoxetine hcl]   Social History:  The patient  reports that she has never smoked.  She has never used smokeless tobacco. She reports that she drinks alcohol. She reports that she does not use drugs.   Family History:   family history includes Aortic stenosis in her mother; Breast cancer (age of onset: 28) in her maternal aunt; Cancer in her father and maternal aunt; Diabetes in her mother; Melanoma in her son; Retinoblastoma in her son.    Review of Systems: Review of Systems  Constitutional: Negative.   Respiratory: Negative.   Cardiovascular: Positive for chest pain.  Gastrointestinal: Negative.   Musculoskeletal: Negative.   Neurological: Negative.   Psychiatric/Behavioral: The patient is nervous/anxious.   All other systems reviewed and are negative.    PHYSICAL EXAM: VS:  BP (!) 140/96 (BP Location: Left Arm, Patient Position: Sitting, Cuff Size: Normal)   Pulse 68   Ht 5' 6.5" (1.689 m)   Wt 183 lb (83 kg)   BMI 29.09 kg/m  , BMI Body mass index is 29.09 kg/m. GEN: Well nourished, well developed, in no acute distress  HEENT: normal  Neck: no JVD, carotid bruits, or masses Cardiac: RRR; no murmurs, rubs, or gallops,no edema  Respiratory:  clear to auscultation bilaterally, normal work of breathing GI: soft, nontender, nondistended, + BS MS: no deformity or atrophy  Skin: warm and dry, no rash Neuro:  Strength and sensation are intact Psych: euthymic mood, full affect    Recent Labs: 05/10/2016: ALT 23; BUN 15; Creatinine, Ser 0.76; Hemoglobin 12.9; Platelets 270.0; Potassium 3.9; Sodium 140; TSH 2.48    Lipid Panel Lab Results  Component Value Date   CHOL 118 05/10/2016   HDL 56.00 05/10/2016   LDLCALC 48 05/10/2016   TRIG 70.0 05/10/2016      Wt Readings from Last 3 Encounters:  05/27/16 183 lb (83 kg)  05/16/16 182 lb 2 oz (82.6 kg)  04/08/16 186 lb 12 oz (84.7 kg)       ASSESSMENT AND PLAN:    Essential hypertension -  Blood pressure elevated on today's visit but likely secondary to anxiety in the setting of chest pain, new  finding of dilated ascending aorta and bicuspid aortic valve. She presents today for further discussion  Chest pain, unspecified type - Atypical chest pain symptoms on the left, musculoskeletal in nature  Ascending aorta dilatation (HCC) Aorta is 4.6 cm on CT scan She would feel more comfortable in getting to know CT surgery in case there is progression of her aorta and this requires surgery. We have scheduled repeat echocardiogram in 6 months time Unfortunately previous echocardiograms from Dr. Juliann Pares are unavailable and prior measurements are uncertain. She is very anxious about this new finding. In general she is asymptomatic which would argue for only considering surgery for ascending aorta greater than 5 cm  Bicuspid aortic valve Details discussed with her. Bicuspid valve in conjunction with dilated ascending aorta is a new  finding for her and causing considerable stress/anxiety. Recommended a course of action which is repeat imaging 6 months to look for progression, possibly even 6 months after that. If no progression could proceed with annual echocardiogram She is asymptomatic  Diabetes mellitus type 2 in obese [facility]) We have encouraged continued exercise, careful diet management in an effort to lose weight.   Total encounter time more than 25 minutes  Greater than 50% was spent in counseling and coordination of care with the patient   Disposition:   F/U  6 months   Orders Placed This Encounter  Procedures  . Ambulatory referral to Cardiothoracic Surgery  . EKG 12-Lead  . ECHOCARDIOGRAM COMPLETE     Signed, Dossie Arbour, M.D., Ph.D. 05/27/2016  [facility] [place], [place] [phone]

## 2016-05-27 NOTE — Telephone Encounter (Signed)
Rx sent to pharmacy   

## 2016-05-27 NOTE — Telephone Encounter (Signed)
Spoke w/  Pt.  She reports ongoing neck pain, but reports pain has radiated to her chest, jaw and shoulder. She does not feel that it is cardiac, but would like an EKG just to make sure, since she had the abnormal CT calcium score.  She denies SOB, n/v or diaphoresis.  She reports sx improve w/ hot shower or applying Biofreeze. She feels that something is different and would like to avoid the ED. Advised her to come over now for nurse visit.  She is appreciative and is on her way now.

## 2016-05-27 NOTE — Telephone Encounter (Signed)
Pt states she has had a "funny feeling" in her chest. States it started about 24 hours ago. States she does have neck issues and her neck is hurting and is going into her chest. Please call. Pt would like to come in today for an EKG..[name]

## 2016-05-27 NOTE — Patient Instructions (Addendum)
We will place referral to CT surgery in [place] for evaluation of dilated ascending aorta, bicuspid aortic valve   Medication Instructions:   No medication changes made  Labwork:  No new labs needed  Testing/Procedures:  Echocardiogram in 6 months for dilated ascending aorta   I recommend watching educational videos on topics of interest to you at:       [email or web address].com  Enter code: [email or web address]    Follow-Up: It was a pleasure seeing you in the office today. Please call us if you have new issues that need to be addressed before your next appt.  [phone]  Your physician wants you to follow-up in: 6 months after echo You will receive a reminder letter in the mail two months in advance. If you don't receive a letter, please call our office to schedule the follow-up appointment.  If you need a refill on your cardiac medications before your next appointment, please call your pharmacy.

## 2016-05-30 ENCOUNTER — Ambulatory Visit: Payer: Self-pay | Admitting: Cardiovascular Disease

## 2016-06-04 ENCOUNTER — Institutional Professional Consult (permissible substitution) (INDEPENDENT_AMBULATORY_CARE_PROVIDER_SITE_OTHER): Payer: 59 | Admitting: Cardiothoracic Surgery

## 2016-06-04 ENCOUNTER — Encounter: Payer: Self-pay | Admitting: Cardiothoracic Surgery

## 2016-06-04 VITALS — BP 135/90 | HR 70 | Resp 20 | Ht 66.5 in | Wt 185.0 lb

## 2016-06-04 DIAGNOSIS — Q231 Congenital insufficiency of aortic valve: Secondary | ICD-10-CM

## 2016-06-04 DIAGNOSIS — I712 Thoracic aortic aneurysm, without rupture: Secondary | ICD-10-CM

## 2016-06-04 DIAGNOSIS — I350 Nonrheumatic aortic (valve) stenosis: Secondary | ICD-10-CM

## 2016-06-04 DIAGNOSIS — I7121 Aneurysm of the ascending aorta, without rupture: Secondary | ICD-10-CM

## 2016-06-05 NOTE — Progress Notes (Signed)
PCP is Sherlene Shams, MD Referring Provider is Antonieta Iba, MD  Chief Complaint  Patient presents with  . Aortic Stenosis    Surgical eval,ECHO 05/27/2016, CTA Chest 05/15/2016  . 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 She is asymptomatic. There is a positive family history bicuspid aortic valve but no family history of aortic dissection or thoracic aneurysm. The patient's mother had a abdominal aortic aneurysm diagnosed at age 24. The patient has had a murmur over 25 years ago. She had no cardiac, location from her 3 pregnancies and deliveries. She presents for evaluation and opinion regarding her asymptomatic thoracic aortic disease.  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 Aortic stenosis   . Aortic valve disorders   . Breast screening, unspecified   . Cervicalgia   . Heart murmur   . Metatarsal fracture 2007   5th, from wearing high heels  . Other symptoms involving abdomen and pelvis(789.9)   . Retinal hemorrhage   . Symptomatic menopausal or female climacteric states   . Unspecified essential hypertension     Past Surgical History:  Procedure Laterality Date  . ABDOMINAL HYSTERECTOMY    . APPENDECTOMY     done during oophorectomy  . CESAREAN SECTION     x 2  . gyn surgery     hysterectomy- done for adenomysosis and endometriosis  . OOPHORECTOMY     unilateral, due to ruptured ovarian cysts  . OSTEOTOMY MAXILLARY     due to bite problems  . TUBAL LIGATION      Family History  Problem Relation Age of Onset  . Diabetes Mother   . Aortic stenosis Mother   . Cancer Father     leukemia  . Retinoblastoma Son    . Melanoma Son   . Cancer Maternal Aunt     breast  . Breast cancer Maternal Aunt 42    Social History Social History  Substance Use Topics  . Smoking status: Never Smoker  . Smokeless tobacco: Never Used  . Alcohol use Yes     Comment: occassional    Current Outpatient Prescriptions  Medication Sig Dispense Refill  . ALPRAZolam (XANAX) 0.25 MG tablet TAKE ONE TABLET BY MOUTH 2 TIMES A DAY AS NEEDED 60 tablet 4  . aspirin 81 MG tablet Take 81 mg by mouth daily.    [name] doxycycline (VIBRAMYCIN) 100 MG capsule Take 1 capsule (100 mg total) by mouth 2 (two) times daily. For tick fever 14 capsule 0  . ESTRACE VAGINAL 0.1 MG/GM vaginal cream INSERT 1/4 APPLICATORFUL VAGINALLY TWO TIMES A WEEK AS DIRECTED 42.5 g 5  . fluticasone (FLONASE) 50 MCG/ACT nasal spray PLACE 2 SPRAYS INTO BOTH NOSTRILS DAILY. 16 g 6  . hydrochlorothiazide (HYDRODIURIL) 25 MG tablet TAKE 1 TABLET BY MOUTH DAILY. 90 tablet 1  . HYDROcodone-acetaminophen (NORCO) 10-325 MG tablet Take 1 tablet by mouth every 8 (eight) hours as needed. 90 tablet 0  . labetalol (NORMODYNE) 200 MG tablet TAKE ONE TABLET BY MOUTH 2 TIMES A DAY 180 tablet 1  . Menthol, Topical Analgesic, (BIOFREEZE EX) Apply topically.    . traMADol (ULTRAM) 50 MG tablet Take 2 tablets (100  mg total) by mouth every 6 (six) hours as needed. for pain 180 tablet 5  . valsartan (DIOVAN) 160 MG tablet TAKE 1 TABLET BY MOUTH ONCE DAILY 90 tablet 2   No current facility-administered medications for this visit.     Allergies  Allergen Reactions  . Prozac [Fluoxetine Hcl] Palpitations    Review of Systems      Patient is nonsmoker. She is right-hand dominant.     The patient denies history of thoracic trauma or surgery.  Review of Systems :  [ y ] = yes, [  ] = no        General :  Weight gain [   ]    Weight loss  [   ]  Fatigue [  ]  Fever [  ]  Chills  [  ]                                Weakness  [  ]           HEENT    Headache Mahler.Beck  ]  Dizziness [   ]  Blurred vision [  ] Glaucoma  [  ]                          Nosebleeds [  ] Painful or loose teeth [  ] She takes amoxicillin before significant dental procedure for her bicuspid aortic valve       Cardiac :  Chest pain/ pressure [  ]  Resting SOB [  ] exertional SOB [  ]                        Orthopnea [  ]  Pedal edema  [  ]  Palpitations [  ] Syncope/presyncope [ ]                         Paroxysmal nocturnal dyspnea [  ]         Pulmonary : cough [  ]  wheezing [  ]  Hemoptysis [  ] Sputum [  ] Snoring [  ]                              Pneumothorax [  ]  Sleep apnea [  ]        GI : Vomiting [  ]  Dysphagia [  ]  Melena  [  ]  Abdominal pain [  ] BRBPR [  ]              Heart burn [  ]  Constipation [  ] Diarrhea  [  ] Colonoscopy [   ]        GU : Hematuria [  ]  Dysuria [  ]  Nocturia [  ] UTI's [  ]        Vascular : Claudication [  ]  Rest pain [  ]  DVT [  ] Vein stripping [  ] leg ulcers [  ]                          TIA [  ] Stroke [  ]  Varicose veins [  ]  NEURO :  Headaches  [ yes] Seizures [  ] Vision changes [  ] Paresthesias [  ]                                       Seizures [  ]        Musculoskeletal :  Arthritis Mahler.Beck[yes  ] Gout  [  ]  Back pain [  ]  Joint pain [  ]        Skin :  Rash [  ]  Melanoma [  ] Sores [  ]        Heme : Bleeding problems [  ]Clotting Disorders [  ] Anemia [  ]Blood Transfusion [ ]         Endocrine : Diabetes [  ] Heat or Cold intolerance [  ] Polyuria [  ]excessive thirst [ ]         Psych : Depression [  ]  Anxiety [  ]  Psych hospitalizations [  ] Memory change [  ]                                               BP 135/90   Pulse 70   Resp 20   Ht 5' 6.5" (1.689 m)   Wt 185 lb (83.9 kg)   SpO2 99% Comment: RA  BMI 29.41 kg/m  Physical Exam     Physical Exam  General:  pleasant middle aged female no acute distress HEENT: Normocephalic pupils equal , dentition adequate Neck: Supple without JVD, adenopathy, or  bruit Chest: Clear to auscultation, symmetrical breath sounds, no rhonchi, no tenderness             or deformity Cardiovascular: Regular rate and rhythm, 2/6 AS murmur, no gallop, peripheral pulses             palpable in all extremities Abdomen:  Soft, nontender, no palpable mass or organomegaly Extremities: Warm, well-perfused, no clubbing cyanosis edema or tenderness,              no venous stasis changes of the legs Rectal/GU: Deferred Neuro: Grossly non--focal and symmetrical throughout Skin: Clean and dry without rash or ulceration   Diagnostic Tests: Mild aortic stenosis good LV function minimal calcification Mild dilatation of the ascending aorta 4.6 cm without mural thickening or ulceration Arch and descending thoracic aorta are of normal diameter  Impression: Patient should have serial scans to follow her thoracic aorta. Dr. Lewie LoronGolan is planning to follow her aortic stenosis with serial echocardiogram. She understands the symptoms of aortic dissection and significant aortic stenosis and will report them if they arise. Plan: She has no previous CT scans with which to compare so we will plan a follow-up CT scan in approximately 6 months and if stable then annually.  Mikey BussingPeter Van Trigt III, MD [facility] [phone]

## 2016-07-18 ENCOUNTER — Other Ambulatory Visit: Payer: Self-pay | Admitting: Internal Medicine

## 2016-08-14 ENCOUNTER — Other Ambulatory Visit: Payer: Self-pay | Admitting: Internal Medicine

## 2016-08-15 ENCOUNTER — Other Ambulatory Visit: Payer: Self-pay

## 2016-08-15 NOTE — Telephone Encounter (Signed)
Refilled: 05/16/2016 Last OV: 05/16/2016 Next OV: 11/14/2016

## 2016-08-16 MED ORDER — HYDROCODONE-ACETAMINOPHEN 10-325 MG PO TABS: 1.0000 | ORAL_TABLET | Freq: Three times a day (TID) | ORAL | 0 refills | Status: DC | PRN

## 2016-08-16 NOTE — Telephone Encounter (Signed)
HAS TO BE SEEN EVERY 3 MONTHS FOR NARCOTICS REFILLS.  30 DAYS GIVEN AS COURTESY.

## 2016-08-16 NOTE — Telephone Encounter (Signed)
rx has been placed up front

## 2016-08-16 NOTE — Telephone Encounter (Signed)
LMTCB. Need to let pt know that her rx is ready to be picked up. Also need to let her know that Dr. Darrick Huntsman only filled it for 30 days because she hasn't been in the last 3 months. Pt will need to schedule an appt in order to get anymore refills.

## 2016-08-19 ENCOUNTER — Telehealth: Payer: Self-pay | Admitting: *Deleted

## 2016-08-19 NOTE — Telephone Encounter (Signed)
Spoke with pt this morning and everything is documented in previous message.

## 2016-08-19 NOTE — Telephone Encounter (Signed)
Spoke with pt and informed her of that her rx has been placed up front. Pt stated that she has an appt scheduled for July and she stated that she will not need anymore refills before then.

## 2016-08-19 NOTE — Telephone Encounter (Signed)
Pt was advised via voicemail to return a call to this office  Pt contact [phone]

## 2016-09-10 ENCOUNTER — Other Ambulatory Visit: Payer: Self-pay | Admitting: Internal Medicine

## 2016-09-10 NOTE — Telephone Encounter (Signed)
Refilled: 12/19/15 Last OV: 05/16/16 Last Labs: 05/16/16 Future OV: 11/14/16 Please advise?

## 2016-09-27 ENCOUNTER — Other Ambulatory Visit: Payer: Self-pay

## 2016-09-27 DIAGNOSIS — J069 Acute upper respiratory infection, unspecified: Secondary | ICD-10-CM

## 2016-09-27 MED ORDER — FLUTICASONE PROPIONATE 50 MCG/ACT NA SUSP: 2.0000 | Freq: Every day | NASAL | 6 refills | Status: DC

## 2016-10-17 ENCOUNTER — Encounter: Payer: Self-pay | Admitting: Internal Medicine

## 2016-10-17 DIAGNOSIS — E118 Type 2 diabetes mellitus with unspecified complications: Secondary | ICD-10-CM

## 2016-10-17 DIAGNOSIS — R5383 Other fatigue: Secondary | ICD-10-CM

## 2016-10-17 DIAGNOSIS — E1169 Type 2 diabetes mellitus with other specified complication: Secondary | ICD-10-CM

## 2016-10-17 DIAGNOSIS — E785 Hyperlipidemia, unspecified: Secondary | ICD-10-CM

## 2016-10-22 ENCOUNTER — Other Ambulatory Visit: Payer: Self-pay | Admitting: Internal Medicine

## 2016-10-22 NOTE — Telephone Encounter (Signed)
Refilled: 05/24/2016 Last OV: 05/16/2016 Next OV: 11/14/2016

## 2016-10-23 NOTE — Telephone Encounter (Signed)
Printed, signed and faxed.  

## 2016-10-23 NOTE — Telephone Encounter (Signed)
refilled 

## 2016-11-11 ENCOUNTER — Other Ambulatory Visit (INDEPENDENT_AMBULATORY_CARE_PROVIDER_SITE_OTHER): Payer: 59

## 2016-11-11 DIAGNOSIS — Z79899 Other long term (current) drug therapy: Secondary | ICD-10-CM | POA: Diagnosis not present

## 2016-11-11 DIAGNOSIS — E1169 Type 2 diabetes mellitus with other specified complication: Secondary | ICD-10-CM

## 2016-11-11 DIAGNOSIS — E118 Type 2 diabetes mellitus with unspecified complications: Secondary | ICD-10-CM

## 2016-11-11 DIAGNOSIS — R5383 Other fatigue: Secondary | ICD-10-CM | POA: Diagnosis not present

## 2016-11-11 DIAGNOSIS — E785 Hyperlipidemia, unspecified: Secondary | ICD-10-CM

## 2016-11-11 LAB — COMPREHENSIVE METABOLIC PANEL
ALT: 19 U/L (ref 0–35)
AST: 22 U/L (ref 0–37)
Albumin: 4.4 g/dL (ref 3.5–5.2)
Alkaline Phosphatase: 67 U/L (ref 39–117)
BUN: 26 mg/dL — ABNORMAL HIGH (ref 6–23)
CO2: 31 mEq/L (ref 19–32)
Calcium: 9.4 mg/dL (ref 8.4–10.5)
Chloride: 102 mEq/L (ref 96–112)
Creatinine, Ser: 0.82 mg/dL (ref 0.40–1.20)
GFR: 76.15 mL/min (ref >60.00)
Glucose, Bld: 151 mg/dL — ABNORMAL HIGH (ref 70–99)
Potassium: 3.7 mEq/L (ref 3.5–5.1)
Sodium: 141 mEq/L (ref 135–145)
Total Bilirubin: 0.5 mg/dL (ref 0.2–1.2)
Total Protein: 7 g/dL (ref 6.0–8.3)

## 2016-11-11 LAB — LIPID PANEL
Cholesterol: 172 mg/dL (ref 0–200)
HDL: 52.6 mg/dL (ref >39.00)
LDL Cholesterol: 94 mg/dL (ref 0–99)
NonHDL: 119.13
Total CHOL/HDL Ratio: 3
Triglycerides: 128 mg/dL (ref 0.0–149.0)
VLDL: 25.6 mg/dL (ref 0.0–40.0)

## 2016-11-11 LAB — TSH: TSH: 1.62 u[IU]/mL (ref 0.35–4.50)

## 2016-11-11 LAB — CBC WITH DIFFERENTIAL/PLATELET
Basophils Absolute: 0.1 10*3/uL (ref 0.0–0.1)
Basophils Relative: 0.9 % (ref 0.0–3.0)
Eosinophils Absolute: 0.2 10*3/uL (ref 0.0–0.7)
Eosinophils Relative: 2.7 % (ref 0.0–5.0)
HCT: 39 % (ref 36.0–46.0)
Hemoglobin: 13.2 g/dL (ref 12.0–15.0)
Lymphocytes Relative: 37.2 % (ref 12.0–46.0)
Lymphs Abs: 2.4 10*3/uL (ref 0.7–4.0)
MCHC: 33.9 g/dL (ref 30.0–36.0)
MCV: 85.4 fl (ref 78.0–100.0)
Monocytes Absolute: 0.6 10*3/uL (ref 0.1–1.0)
Monocytes Relative: 9.2 % (ref 3.0–12.0)
Neutro Abs: 3.2 10*3/uL (ref 1.4–7.7)
Neutrophils Relative %: 50 % (ref 43.0–77.0)
Platelets: 270 10*3/uL (ref 150.0–400.0)
RBC: 4.57 Mil/uL (ref 3.87–5.11)
RDW: 13.3 % (ref 11.5–15.5)
WBC: 6.3 10*3/uL (ref 4.0–10.5)

## 2016-11-11 LAB — HEMOGLOBIN A1C: Hgb A1c MFr Bld: 6.5 % (ref 4.6–6.5)

## 2016-11-11 LAB — LDL CHOLESTEROL, DIRECT: Direct LDL: 101 mg/dL

## 2016-11-11 NOTE — Addendum Note (Signed)
Addended by: Penne LashWIGGINS, Friedrich Harriott N on: 11/11/2016 09:06 AM   Modules accepted: Orders

## 2016-11-12 ENCOUNTER — Encounter: Payer: Self-pay | Admitting: Internal Medicine

## 2016-11-14 ENCOUNTER — Ambulatory Visit (INDEPENDENT_AMBULATORY_CARE_PROVIDER_SITE_OTHER): Payer: 59 | Admitting: Internal Medicine

## 2016-11-14 ENCOUNTER — Encounter: Payer: Self-pay | Admitting: Internal Medicine

## 2016-11-14 VITALS — BP 150/94 | HR 74 | Temp 98.0°F | Resp 16 | Ht 66.5 in | Wt 182.8 lb

## 2016-11-14 DIAGNOSIS — M489 Spondylopathy, unspecified: Secondary | ICD-10-CM | POA: Diagnosis not present

## 2016-11-14 DIAGNOSIS — E1169 Type 2 diabetes mellitus with other specified complication: Secondary | ICD-10-CM

## 2016-11-14 DIAGNOSIS — E785 Hyperlipidemia, unspecified: Secondary | ICD-10-CM

## 2016-11-14 DIAGNOSIS — E669 Obesity, unspecified: Secondary | ICD-10-CM | POA: Diagnosis not present

## 2016-11-14 DIAGNOSIS — I350 Nonrheumatic aortic (valve) stenosis: Secondary | ICD-10-CM

## 2016-11-14 DIAGNOSIS — R5383 Other fatigue: Secondary | ICD-10-CM | POA: Diagnosis not present

## 2016-11-14 DIAGNOSIS — I10 Essential (primary) hypertension: Secondary | ICD-10-CM

## 2016-11-14 MED ORDER — HYDROCODONE-ACETAMINOPHEN 10-325 MG PO TABS: 1.0000 | ORAL_TABLET | Freq: Two times a day (BID) | ORAL | 0 refills | Status: DC | PRN

## 2016-11-14 MED ORDER — VALSARTAN-HYDROCHLOROTHIAZIDE 160-12.5 MG PO TABS: 1.0000 | ORAL_TABLET | Freq: Every day | ORAL | 1 refills | Status: DC

## 2016-11-14 MED ORDER — TRAMADOL HCL 50 MG PO TABS: 100.0000 mg | ORAL_TABLET | Freq: Four times a day (QID) | ORAL | 5 refills | Status: DC | PRN

## 2016-11-14 MED ORDER — HYDROCHLOROTHIAZIDE 12.5 MG PO TABS: 25.0000 mg | ORAL_TABLET | Freq: Every day | ORAL | 0 refills | Status: DC

## 2016-11-14 NOTE — Patient Instructions (Signed)
I agree with reducing the hctz to 12.5 mg daily  We can increase the labetalol if needed..   Take your home BP cuff to work with you and make sure it is accurate (compare side by side measurements with the hospital's BP machine   Resume crestor 1/2 tablet daily or 1 tablet very other day  Return for fasting labs and liver enzymes in 6 week s (we'll recheck the BUN too)

## 2016-11-14 NOTE — Progress Notes (Signed)
Subjective:  Patient ID: Courtney Lopez, female    DOB: 29-Jun-1960  Age: 56 y.o. MRN: [id number]  CC: The primary encounter diagnosis was Hyperlipidemia associated with type 2 diabetes mellitus (HCC). Diagnoses of Essential hypertension, Hyperlipidemia with target LDL less than 100, Diabetes mellitus type 2 in obese [facility]), Cervical spine disease, Aortic valve stenosis, etiology of cardiac valve disease unspecified, and Fatigue, unspecified type were also pertinent to this visit.  HPI Asuncion Tapscott Lorge presents for follow up on chronic issues including Type 2 DM with overweight  and hyperlipidemia, cervical spine disease with chronic pain managed with opioids   Being followed by cardiology for aortic stenosis and dilated ascending aorta.  .  Cardiothoracic eval done. Surveillance with TTE every 6 months  And surgery for diameter > 5 cm     Fasting labs reviewed and notable for a1c 6.5 and LDL 100 without crestor  No weight change  .has reduced hctz to 1/2 tablet   Cutting back on vicodin to 1-2 daily and using 6 to 8 tramadol    Has eye exam due..    Outpatient Medications Prior to Visit  Medication Sig Dispense Refill  . ALPRAZolam (XANAX) 0.25 MG tablet TAKE 1 TABLET BY MOUTH TWICE A DAY AS NEEDED 60 tablet 4  . aspirin 81 MG tablet Take 81 mg by mouth daily.    [name] doxycycline (VIBRAMYCIN) 100 MG capsule Take 1 capsule (100 mg total) by mouth 2 (two) times daily. For tick fever 14 capsule 0  . estradiol (ESTRACE) 0.1 MG/GM vaginal cream INSERT 1/4 APPLICATORFUL VAGINALLY TWO TIMES A WEEK AS DIRECTED 42.5 g 5  . fluticasone (FLONASE) 50 MCG/ACT nasal spray Place 2 sprays into both nostrils daily. 16 g 6  . labetalol (NORMODYNE) 200 MG tablet TAKE ONE TABLET BY MOUTH 2 TIMES A DAY 180 tablet 1  . Menthol, Topical Analgesic, (BIOFREEZE EX) Apply topically.    . valsartan (DIOVAN) 160 MG tablet TAKE 1 TABLET BY MOUTH ONCE DAILY 90 tablet 1  . hydrochlorothiazide (HYDRODIURIL) 25 MG  tablet TAKE 1 TABLET BY MOUTH DAILY. 90 tablet 1  . HYDROcodone-acetaminophen (NORCO) 10-325 MG tablet Take 1 tablet by mouth every 8 (eight) hours as needed. 90 tablet 0  . traMADol (ULTRAM) 50 MG tablet Take 2 tablets (100 mg total) by mouth every 6 (six) hours as needed. for pain 180 tablet 5   No facility-administered medications prior to visit.     Review of Systems;  Patient denies headache, fevers, malaise, unintentional weight loss, skin rash, eye pain, sinus congestion and sinus pain, sore throat, dysphagia,  hemoptysis , cough, dyspnea, wheezing, chest pain, palpitations, orthopnea, edema, abdominal pain, nausea, melena, diarrhea, constipation, flank pain, dysuria, hematuria, urinary  Frequency, nocturia, numbness, tingling, seizures,  Focal weakness, Loss of consciousness,  Tremor, insomnia, depression, anxiety, and suicidal ideation.      Objective:   BP (!) 150/94 (BP Location: Left Arm, Patient Position: Sitting, Cuff Size: Normal)   Pulse 74   Temp 98 F (36.7 C) (Oral)   Resp 16   Ht 5' 6.5" (1.689 m)   Wt 182 lb 12.8 oz (82.9 kg)   SpO2 98%   BMI 29.06 kg/m    BP Readings from Last 3 Encounters:  11/14/16 (!) 150/94  06/04/16 135/90  05/27/16 (!) 140/96    Wt Readings from Last 3 Encounters:  11/14/16 182 lb 12.8 oz (82.9 kg)  06/04/16 185 lb (83.9 kg)  05/27/16 183 lb (83 kg)  General appearance: alert, cooperative and appears stated age Ears: normal TM's and external ear canals both ears Throat: lips, mucosa, and tongue normal; teeth and gums normal Neck: no adenopathy, no carotid bruit, supple, symmetrical, trachea midline and thyroid not enlarged, symmetric, no tenderness/mass/nodules Back: symmetric, no curvature. ROM normal. No CVA tenderness. Lungs: clear to auscultation bilaterally Heart: regular rate and rhythm, S1, S2 normal, no murmur, click, rub or gallop Abdomen: soft, non-tender; bowel sounds normal; no masses,  no organomegaly Pulses:  2+ and symmetric Skin: Skin color, texture, turgor normal. No rashes or lesions Lymph nodes: Cervical, supraclavicular, and axillary nodes normal.  Lab Results  Component Value Date   HGBA1C 6.5 11/11/2016   HGBA1C 6.1 05/10/2016   HGBA1C 6.1 10/26/2015    Lab Results  Component Value Date   CREATININE 0.82 11/11/2016   CREATININE 0.76 05/10/2016   CREATININE 0.83 10/26/2015    Lab Results  Component Value Date   WBC 6.3 11/11/2016   HGB 13.2 11/11/2016   HCT 39.0 11/11/2016   PLT 270.0 11/11/2016   GLUCOSE 151 (H) 11/11/2016   CHOL 172 11/11/2016   TRIG 128.0 11/11/2016   HDL 52.60 11/11/2016   LDLDIRECT 101.0 11/11/2016   LDLCALC 94 11/11/2016   ALT 19 11/11/2016   AST 22 11/11/2016   NA 141 11/11/2016   K 3.7 11/11/2016   CL 102 11/11/2016   CREATININE 0.82 11/11/2016   BUN 26 (H) 11/11/2016   CO2 31 11/11/2016   TSH 1.62 11/11/2016   HGBA1C 6.5 11/11/2016   MICROALBUR 4.1 (H) 05/10/2016    Ct Angio Chest Aorta W/cm &/or Wo/cm  Result Date: 05/15/2016 CLINICAL DATA:  Prominent ascending aorta noted on CT for calcium scoring, followup EXAM: CT ANGIOGRAPHY CHEST WITH CONTRAST TECHNIQUE: Multidetector CT imaging of the chest was performed using the standard protocol during bolus administration of intravenous contrast. Multiplanar CT image reconstructions and MIPs were obtained to evaluate the vascular anatomy. CONTRAST:  75 cc Isovue 370 COMPARISON:  None. FINDINGS: Cardiovascular: By history the fusiformly dilated ascending aorta on prior calcium scoring study measured 4.6 cm. On image 46 series 40 the mid ascending thoracic aorta measures 4.6 cm, being stable. The aortic arch and descending thoracic aorta are normal in caliber. The origins of the great vessels appear patent. The heart is within upper limits of normal. No pericardial effusion is seen. Mediastinum/Nodes: No mediastinal or hilar adenopathy is noted. The pulmonary arteries are not well opacified but no  central abnormality is seen. The thyroid gland is unremarkable. There may be a small hiatal hernia present Lungs/Pleura: On lung window images, no lung parenchymal abnormality is seen. No pneumonia is noted and there is no evidence of nodule. No pleural effusion is seen. The central airway is patent. Upper Abdomen: The portion of the upper abdomen that is visualized is unremarkable. Musculoskeletal: The thoracic vertebrae are normal alignment with mild degenerative change. No compression deformity is seen. Review of the MIP images confirms the above findings. IMPRESSION: 1. Stable fusiform dilatation of the ascending aorta measuring 4.6 cm in diameter, identical to prior outside measurement from CT Cardiac scoring. 2. Question small hiatal hernia Electronically Signed   By: Dwyane DeePaul  Barry M.D.   On: 05/15/2016 09:25    Assessment & Plan:   Problem List Items Addressed This Visit    Cervical spine disease    Chronic , with cervical stenosis and  recent exacerbation causing radiculopathy.  Reassure her that periodic infrequent use of steroid taper is unlikely  to result in long term complications.    She has not had any ER visits  And has not requested any early refills.  Her Refill history was confirmed via [place] Controlled Substance database by me today during her visit and there have been no prescriptions of controlled substances filled from any providers other than me. .       Hypertension    Reducing hctz to 12.5 mg daily due to elevation in BUN.  Will increase labetalol f needed.        Relevant Medications   valsartan-hydrochlorothiazide (DIOVAN HCT) 160-12.5 MG tablet   hydrochlorothiazide (HYDRODIURIL) 12.5 MG tablet   Other Relevant Orders   Basic metabolic panel   Hyperlipidemia with target LDL less than 100    Advised to resume crestor at lower dose , 5 mg daily       Relevant Medications   valsartan-hydrochlorothiazide (DIOVAN HCT) 160-12.5 MG tablet   hydrochlorothiazide (HYDRODIURIL)  12.5 MG tablet   Diabetes mellitus type 2 in obese (HCC)     Historically well-controlled on diet alone .  hemoglobin A1c has been consistently at or  less than 7.0 . Patient is up-to-date on eye exams and foot exam is normal today. Patient has early microalbuminuria. Patient is tolerating statin therapy for CAD risk reduction and on ACE/ARB for renal protection and hypertension   Lab Results  Component Value Date   HGBA1C 6.5 11/11/2016   Lab Results  Component Value Date   MICROALBUR 4.1 (H) 05/10/2016         Relevant Medications   valsartan-hydrochlorothiazide (DIOVAN HCT) 160-12.5 MG tablet   Aortic stenosis    She has had cardiothoracic surgery evaluation.  No surgical intervention at this time; 6 month TTE surveillance.       Relevant Medications   valsartan-hydrochlorothiazide (DIOVAN HCT) 160-12.5 MG tablet   hydrochlorothiazide (HYDRODIURIL) 12.5 MG tablet    Other Visit Diagnoses    Hyperlipidemia associated with type 2 diabetes mellitus (HCC)    -  Primary   Relevant Medications   valsartan-hydrochlorothiazide (DIOVAN HCT) 160-12.5 MG tablet   hydrochlorothiazide (HYDRODIURIL) 12.5 MG tablet   Other Relevant Orders   Lipid panel   Hepatic function panel   Fatigue, unspecified type       Relevant Orders   Hepatitis C antibody      I have changed Ms. Heidemann's hydrochlorothiazide. I am also having her start on valsartan-hydrochlorothiazide. Additionally, I am having her maintain her aspirin, (Menthol, Topical Analgesic, (BIOFREEZE EX)), doxycycline, labetalol, estradiol, fluticasone, valsartan, ALPRAZolam, traMADol, and HYDROcodone-acetaminophen.  Meds ordered this encounter  Medications  . valsartan-hydrochlorothiazide (DIOVAN HCT) 160-12.5 MG tablet    Sig: Take 1 tablet by mouth daily.    Dispense:  30 tablet    Refill:  1  . traMADol (ULTRAM) 50 MG tablet    Sig: Take 2 tablets (100 mg total) by mouth every 6 (six) hours as needed. for pain    Dispense:   240 tablet    Refill:  5    Keep on file for future refills  . DISCONTD: HYDROcodone-acetaminophen (NORCO) 10-325 MG tablet    Sig: Take 1 tablet by mouth every 12 (twelve) hours as needed.    Dispense:  60 tablet    Refill:  0  . DISCONTD: HYDROcodone-acetaminophen (NORCO) 10-325 MG tablet    Sig: Take 1 tablet by mouth every 12 (twelve) hours as needed.    Dispense:  60 tablet    Refill:  0  May refill on or after December 15 2016  . HYDROcodone-acetaminophen (NORCO) 10-325 MG tablet    Sig: Take 1 tablet by mouth every 12 (twelve) hours as needed.    Dispense:  60 tablet    Refill:  0    May refill on or after January 15, 2017  . hydrochlorothiazide (HYDRODIURIL) 12.5 MG tablet    Sig: Take 2 tablets (25 mg total) by mouth daily.    Dispense:  90 tablet    Refill:  0    Medications Discontinued During This Encounter  Medication Reason  . traMADol (ULTRAM) 50 MG tablet Reorder  . HYDROcodone-acetaminophen (NORCO) 10-325 MG tablet Reorder  . HYDROcodone-acetaminophen (NORCO) 10-325 MG tablet Reorder  . HYDROcodone-acetaminophen (NORCO) 10-325 MG tablet Reorder  . hydrochlorothiazide (HYDRODIURIL) 25 MG tablet Reorder    Follow-up: Return in about 6 months (around 05/17/2017).   Sherlene Shams, MD

## 2016-11-16 NOTE — Assessment & Plan Note (Signed)
Reducing hctz to 12.5 mg daily due to elevation in BUN.  Will increase labetalol f needed.

## 2016-11-16 NOTE — Assessment & Plan Note (Signed)
She has had cardiothoracic surgery evaluation.  No surgical intervention at this time; 6 month TTE surveillance.

## 2016-11-16 NOTE — Assessment & Plan Note (Signed)
Historically well-controlled on diet alone .  hemoglobin A1c has been consistently at or  less than 7.0 . Patient is up-to-date on eye exams and foot exam is normal today. Patient has early microalbuminuria. Patient is tolerating statin therapy for CAD risk reduction and on ACE/ARB for renal protection and hypertension   Lab Results  Component Value Date   HGBA1C 6.5 11/11/2016   Lab Results  Component Value Date   MICROALBUR 4.1 (H) 05/10/2016

## 2016-11-16 NOTE — Assessment & Plan Note (Addendum)
Chronic , with cervical stenosis and  recent exacerbation causing radiculopathy.  Reassure her that periodic infrequent use of steroid taper is unlikely to result in long term complications.    She has not had any ER visits  And has not requested any early refills.  Her Refill history was confirmed via [place] Controlled Substance database by me today during her visit and there have been no prescriptions of controlled substances filled from any providers other than me. .[name]

## 2016-11-16 NOTE — Assessment & Plan Note (Signed)
Advised to resume crestor at lower dose , 5 mg daily

## 2016-11-22 NOTE — Progress Notes (Deleted)
Cardiology Office Note  Date:  11/22/2016   ID:  MURIAL BEAM, DOB [birth year], MRN [id number]  PCP:  Sherlene Shams, MD   No chief complaint on file.   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, civil (consulting) at [employer] with history of  aortic valve disease, Bicuspid aortic valve,  hypertension  dilated ascending aorta  Who presents for follow-up of her aortic valve disease  Previous echocardiogram December 2017, CT scan for dilated ascending aorta Echocardiogram suggested bicuspid aortic valve  CT scan January 2018 Confirming 4.6 cm dilated ascending aorta , arch normal size  Very low CT coronary calcium score     last night she had Chest pain on left side, made her concerned about her aorta Symptoms sound musculoskeletal, hurt on palpation, movement of her arm Typically has pain in her paravertebral muscles  Anxious today but in general blood pressure running within a reasonable range  On a prior office visit she reported  having left arm pain, likely from her neck, radiculopathy   EKG on today's visit shows normal sinus rhythm with rate 68 bpm,  left axis deviation, no significant ST or T wave changes  Other past medical history  diagnosis of murmur and aortic valve disease was made as she was pregnant with her third child. Murmur persisted even after delivery. She has been seen by Dr. Juliann Pares for many years with periodic echocardiogram. She has never been told it is bicuspid or tricuspid valve.   Patient has never smoked  She does report family history of SVT requiring ablation, father died at age 68 from cancer, mother with aortic valve stenosis but was in her 56s   PMH:   has a past medical history of Aortic stenosis; Aortic valve disorders; Breast screening, unspecified; Cervicalgia; Heart murmur; Metatarsal fracture (2007); Other symptoms involving abdomen and pelvis(789.9); Retinal hemorrhage; Symptomatic menopausal or female climacteric states; and Unspecified essential  hypertension.  PSH:    Past Surgical History:  Procedure Laterality Date  . ABDOMINAL HYSTERECTOMY    . APPENDECTOMY     done during oophorectomy  . CESAREAN SECTION     x 2  . gyn surgery     hysterectomy- done for adenomysosis and endometriosis  . OOPHORECTOMY     unilateral, due to ruptured ovarian cysts  . OSTEOTOMY MAXILLARY     due to bite problems  . TUBAL LIGATION      Current Outpatient Prescriptions  Medication Sig Dispense Refill  . ALPRAZolam (XANAX) 0.25 MG tablet TAKE 1 TABLET BY MOUTH TWICE A DAY AS NEEDED 60 tablet 4  . aspirin 81 MG tablet Take 81 mg by mouth daily.    [name] doxycycline (VIBRAMYCIN) 100 MG capsule Take 1 capsule (100 mg total) by mouth 2 (two) times daily. For tick fever 14 capsule 0  . estradiol (ESTRACE) 0.1 MG/GM vaginal cream INSERT 1/4 APPLICATORFUL VAGINALLY TWO TIMES A WEEK AS DIRECTED 42.5 g 5  . fluticasone (FLONASE) 50 MCG/ACT nasal spray Place 2 sprays into both nostrils daily. 16 g 6  . hydrochlorothiazide (HYDRODIURIL) 12.5 MG tablet Take 2 tablets (25 mg total) by mouth daily. 90 tablet 0  . HYDROcodone-acetaminophen (NORCO) 10-325 MG tablet Take 1 tablet by mouth every 12 (twelve) hours as needed. 60 tablet 0  . labetalol (NORMODYNE) 200 MG tablet TAKE ONE TABLET BY MOUTH 2 TIMES A DAY 180 tablet 1  . Menthol, Topical Analgesic, (BIOFREEZE EX) Apply topically.    . traMADol (ULTRAM) 50 MG tablet Take 2  tablets (100 mg total) by mouth every 6 (six) hours as needed. for pain 240 tablet 5  . valsartan (DIOVAN) 160 MG tablet TAKE 1 TABLET BY MOUTH ONCE DAILY 90 tablet 1  . valsartan-hydrochlorothiazide (DIOVAN HCT) 160-12.5 MG tablet Take 1 tablet by mouth daily. 30 tablet 1   No current facility-administered medications for this visit.      Allergies:   Prozac [fluoxetine hcl]   Social History:  The patient  reports that she has never smoked. She has never used smokeless tobacco. She reports that she drinks alcohol. She reports that  she does not use drugs.   Family History:   family history includes Aortic stenosis in her mother; Breast cancer (age of onset: 67) in her maternal aunt; Cancer in her father and maternal aunt; Diabetes in her mother; Melanoma in her son; Retinoblastoma in her son.    Review of Systems: Review of Systems  Constitutional: Negative.   Respiratory: Negative.   Cardiovascular: Positive for chest pain.  Gastrointestinal: Negative.   Musculoskeletal: Negative.   Neurological: Negative.   Psychiatric/Behavioral: The patient is nervous/anxious.   All other systems reviewed and are negative.    PHYSICAL EXAM: VS:  There were no vitals taken for this visit. , BMI There is no height or weight on file to calculate BMI. GEN: Well nourished, well developed, in no acute distress  HEENT: normal  Neck: no JVD, carotid bruits, or masses Cardiac: RRR; no murmurs, rubs, or gallops,no edema  Respiratory:  clear to auscultation bilaterally, normal work of breathing GI: soft, nontender, nondistended, + BS MS: no deformity or atrophy  Skin: warm and dry, no rash Neuro:  Strength and sensation are intact Psych: euthymic mood, full affect    Recent Labs: 11/11/2016: ALT 19; BUN 26; Creatinine, Ser 0.82; Hemoglobin 13.2; Platelets 270.0; Potassium 3.7; Sodium 141; TSH 1.62    Lipid Panel Lab Results  Component Value Date   CHOL 172 11/11/2016   HDL 52.60 11/11/2016   LDLCALC 94 11/11/2016   TRIG 128.0 11/11/2016      Wt Readings from Last 3 Encounters:  11/14/16 182 lb 12.8 oz (82.9 kg)  06/04/16 185 lb (83.9 kg)  05/27/16 183 lb (83 kg)       ASSESSMENT AND PLAN:    Essential hypertension -  Blood pressure elevated on today's visit but likely secondary to anxiety in the setting of chest pain, new finding of dilated ascending aorta and bicuspid aortic valve. She presents today for further discussion  Chest pain, unspecified type - Atypical chest pain symptoms on the left,  musculoskeletal in nature  Ascending aorta dilatation (HCC) Aorta is 4.6 cm on CT scan She would feel more comfortable in getting to know CT surgery in case there is progression of her aorta and this requires surgery. We have scheduled repeat echocardiogram in 6 months time Unfortunately previous echocardiograms from Dr. Juliann Pares are unavailable and prior measurements are uncertain. She is very anxious about this new finding. In general she is asymptomatic which would argue for only considering surgery for ascending aorta greater than 5 cm  Bicuspid aortic valve Details discussed with her. Bicuspid valve in conjunction with dilated ascending aorta is a new finding for her and causing considerable stress/anxiety. Recommended a course of action which is repeat imaging 6 months to look for progression, possibly even 6 months after that. If no progression could proceed with annual echocardiogram She is asymptomatic  Diabetes mellitus type 2 in obese [facility]) We have encouraged  continued exercise, careful diet management in an effort to lose weight.   Total encounter time more than 25 minutes  Greater than 50% was spent in counseling and coordination of care with the patient   Disposition:   F/U  6 months   No orders of the defined types were placed in this encounter.    Signed, Dossie Arbourim Gollan, M.D., Ph.D. 11/22/2016  [facility], [place] [phone]

## 2016-11-25 ENCOUNTER — Ambulatory Visit: Payer: Self-pay | Admitting: Cardiovascular Disease

## 2016-12-19 ENCOUNTER — Telehealth: Payer: Self-pay | Admitting: *Deleted

## 2016-12-19 NOTE — Telephone Encounter (Signed)
Dr Lavonna RuaMary Makhlou( dental office) would like pt's last labs and office note . Pt is currently at her dental appt  Fax fax [phone]  Contact [phone]

## 2016-12-19 NOTE — Telephone Encounter (Signed)
Faxed requested labs and office note.

## 2016-12-23 ENCOUNTER — Other Ambulatory Visit: Payer: Self-pay

## 2016-12-30 ENCOUNTER — Other Ambulatory Visit (INDEPENDENT_AMBULATORY_CARE_PROVIDER_SITE_OTHER): Payer: 59

## 2016-12-30 DIAGNOSIS — E785 Hyperlipidemia, unspecified: Secondary | ICD-10-CM

## 2016-12-30 DIAGNOSIS — I10 Essential (primary) hypertension: Secondary | ICD-10-CM | POA: Diagnosis not present

## 2016-12-30 DIAGNOSIS — R5383 Other fatigue: Secondary | ICD-10-CM

## 2016-12-30 DIAGNOSIS — E1169 Type 2 diabetes mellitus with other specified complication: Secondary | ICD-10-CM | POA: Diagnosis not present

## 2016-12-30 LAB — BASIC METABOLIC PANEL
BUN: 23 mg/dL (ref 6–23)
CO2: 28 mEq/L (ref 19–32)
Calcium: 9.4 mg/dL (ref 8.4–10.5)
Chloride: 103 mEq/L (ref 96–112)
Creatinine, Ser: 0.76 mg/dL (ref 0.40–1.20)
GFR: 83.09 mL/min (ref >60.00)
Glucose, Bld: 145 mg/dL — ABNORMAL HIGH (ref 70–99)
Potassium: 3.3 mEq/L — ABNORMAL LOW (ref 3.5–5.1)
Sodium: 139 mEq/L (ref 135–145)

## 2016-12-30 LAB — LIPID PANEL
Cholesterol: 109 mg/dL (ref 0–200)
HDL: 55.6 mg/dL (ref >39.00)
LDL Cholesterol: 43 mg/dL (ref 0–99)
NonHDL: 53.17
Total CHOL/HDL Ratio: 2
Triglycerides: 50 mg/dL (ref 0.0–149.0)
VLDL: 10 mg/dL (ref 0.0–40.0)

## 2016-12-30 LAB — HEPATIC FUNCTION PANEL
ALT: 19 U/L (ref 0–35)
AST: 22 U/L (ref 0–37)
Albumin: 4.4 g/dL (ref 3.5–5.2)
Alkaline Phosphatase: 59 U/L (ref 39–117)
Bilirubin, Direct: 0.2 mg/dL (ref 0.0–0.3)
Total Bilirubin: 0.6 mg/dL (ref 0.2–1.2)
Total Protein: 7.7 g/dL (ref 6.0–8.3)

## 2016-12-31 LAB — HEPATITIS C ANTIBODY: HCV Ab: NONREACTIVE

## 2017-01-01 ENCOUNTER — Encounter: Payer: Self-pay | Admitting: Internal Medicine

## 2017-01-01 ENCOUNTER — Other Ambulatory Visit: Payer: Self-pay | Admitting: Internal Medicine

## 2017-01-01 MED ORDER — POTASSIUM CHLORIDE CRYS ER 20 MEQ PO TBCR: 20.0000 meq | EXTENDED_RELEASE_TABLET | Freq: Every day | ORAL | 3 refills | Status: DC

## 2017-01-01 MED ORDER — LOSARTAN POTASSIUM-HCTZ 100-25 MG PO TABS: 1.0000 | ORAL_TABLET | Freq: Every day | ORAL | 3 refills | Status: DC

## 2017-01-03 ENCOUNTER — Other Ambulatory Visit: Payer: Self-pay | Admitting: Cardiothoracic Surgery

## 2017-01-03 DIAGNOSIS — I77810 Thoracic aortic ectasia: Secondary | ICD-10-CM

## 2017-01-07 ENCOUNTER — Other Ambulatory Visit: Payer: Self-pay | Admitting: Cardiovascular Disease

## 2017-01-07 DIAGNOSIS — R011 Cardiac murmur, unspecified: Secondary | ICD-10-CM

## 2017-01-07 DIAGNOSIS — R079 Chest pain, unspecified: Secondary | ICD-10-CM

## 2017-01-08 ENCOUNTER — Other Ambulatory Visit: Payer: Self-pay

## 2017-01-08 ENCOUNTER — Ambulatory Visit (INDEPENDENT_AMBULATORY_CARE_PROVIDER_SITE_OTHER): Payer: 59

## 2017-01-08 DIAGNOSIS — R079 Chest pain, unspecified: Secondary | ICD-10-CM | POA: Diagnosis not present

## 2017-01-08 DIAGNOSIS — R011 Cardiac murmur, unspecified: Secondary | ICD-10-CM

## 2017-01-14 ENCOUNTER — Telehealth: Payer: Self-pay | Admitting: Cardiovascular Disease

## 2017-01-14 ENCOUNTER — Encounter: Payer: Self-pay | Admitting: Internal Medicine

## 2017-01-14 ENCOUNTER — Other Ambulatory Visit: Payer: Self-pay | Admitting: Internal Medicine

## 2017-01-14 NOTE — Telephone Encounter (Signed)
Spoke with patient and she states that since starting the losartan-hydrochlorothiazide she has had increased palpitations with exertion and even waking her up from sleep. She reports that she had 5 episodes yesterday and has just noticed a big difference. She states that her blood pressures have been elevated and has been taking extra doses of labetalol to help with that. She has not been able to do her normal walk due to the shortness of breath. She has appt on Friday and I told her that I would route message to you and call her back sooner if any changes need to be made. She was appreciative for the call and had no further questions at this time.

## 2017-01-14 NOTE — Telephone Encounter (Signed)
Pt states she is taking Losartan and has had increased palpitations and BP since taking this. Please call.

## 2017-01-14 NOTE — Telephone Encounter (Signed)
Left voicemail message to call back  

## 2017-01-14 NOTE — Telephone Encounter (Signed)
I can talk about it with her on Friday

## 2017-01-15 NOTE — Telephone Encounter (Signed)
Spoke with patient and let her know that Dr. Mariah MillingGollan will review with her on Friday at her upcoming appointment. Instructed her to keep a log of blood pressure readings to bring to her appointment as well. She verbalized understanding with no further questions at this time.

## 2017-01-17 ENCOUNTER — Encounter: Payer: Self-pay | Admitting: Cardiovascular Disease

## 2017-01-17 ENCOUNTER — Ambulatory Visit (INDEPENDENT_AMBULATORY_CARE_PROVIDER_SITE_OTHER): Payer: 59 | Admitting: Cardiovascular Disease

## 2017-01-17 VITALS — BP 122/80 | HR 81 | Ht 66.0 in | Wt 184.8 lb

## 2017-01-17 DIAGNOSIS — Q231 Congenital insufficiency of aortic valve: Secondary | ICD-10-CM | POA: Diagnosis not present

## 2017-01-17 MED ORDER — HYDROCHLOROTHIAZIDE 25 MG PO TABS: 25.0000 mg | ORAL_TABLET | Freq: Every day | ORAL | 4 refills | Status: DC

## 2017-01-17 MED ORDER — LOSARTAN POTASSIUM 100 MG PO TABS: 100.0000 mg | ORAL_TABLET | Freq: Every day | ORAL | 4 refills | Status: DC

## 2017-01-17 NOTE — Patient Instructions (Signed)

## 2017-01-17 NOTE — Progress Notes (Addendum)
Cardiology Office Note  Date:  01/17/2017   ID:  Courtney Lopez, DOB Jan 15, 1961, MRN [id number]  PCP:  Sherlene Shams, MD   Chief Complaint  Patient presents with  . other    6 month follow up. Patient c/o SOB that couls be from a new medication that was added. Meds reviewed verbally with patient.     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 One recorded very low blood pressure 80 systolic, other pressures 130s, 140s Initially did not seem to be working as well but slowly blood pressure has been improving  Having periodic palpitations, wondered if it could've been from the new medication She is taking HCTZ 25, previously was taking 12.5 Potassium running low, takes supplemental potassium  Recent echocardiogram results reviewed with her Ascending aorta dilatation 4.2 cm. Stable Bicuspid aortic valve, stable, no significant stenosis. Mean gradient has not changed  Normal cardiac function  Previous CT scan chest 2017 and early 2017 with dilated ascending aorta  4.6 cm Very low CT coronary calcium score   EKG on today's visit shows normal sinus rhythm with rate 80 bpm,  left axis deviation, no significant ST or T wave changes  Other past medical history  diagnosis of murmur and aortic valve disease was made as she was pregnant with her third child. Murmur persisted even after delivery. She has been seen by Dr. Juliann Pares for many years with periodic echocardiogram. She has never been told it is bicuspid or tricuspid valve.   Patient has never smoked  She does report family history of SVT requiring ablation, father died at age 8 from cancer, mother  with aortic valve stenosis but was in her 31s   PMH:   has a past medical history of Aortic stenosis; Aortic valve disorders; Breast screening, unspecified; Cervicalgia; Heart murmur; Metatarsal fracture (2007); Other symptoms involving abdomen and pelvis(789.9); Retinal hemorrhage; Symptomatic menopausal or female climacteric states; and Unspecified essential hypertension.  PSH:    Past Surgical History:  Procedure Laterality Date  . ABDOMINAL HYSTERECTOMY    . APPENDECTOMY     done during oophorectomy  . CESAREAN SECTION     x 2  . gyn surgery     hysterectomy- done for adenomysosis and endometriosis  . OOPHORECTOMY     unilateral, due to ruptured ovarian cysts  . OSTEOTOMY MAXILLARY     due to bite problems  . TUBAL LIGATION      Current Outpatient Prescriptions  Medication Sig Dispense Refill  . ALPRAZolam (XANAX) 0.25 MG tablet TAKE 1 TABLET BY MOUTH TWICE A DAY AS NEEDED 60 tablet 4  . aspirin 81 MG tablet Take 81 mg by mouth daily.    [name] estradiol (ESTRACE) 0.1 MG/GM vaginal cream INSERT 1/4 APPLICATORFUL VAGINALLY TWO TIMES A WEEK AS DIRECTED 42.5 g 5  . fluticasone (FLONASE) 50 MCG/ACT nasal spray Place 2 sprays into both nostrils daily. 16 g 6  . HYDROcodone-acetaminophen (NORCO) 10-325 MG tablet Take 1 tablet by mouth every 12 (twelve) hours as needed. 60 tablet 0  . labetalol (NORMODYNE) 200 MG tablet TAKE ONE TABLET BY MOUTH 2 TIMES A DAY 180 tablet 1  . losartan-hydrochlorothiazide (HYZAAR) 100-25 MG tablet  Take 1 tablet by mouth daily. 90 tablet 3  . Menthol, Topical Analgesic, (BIOFREEZE EX) Apply topically.    . potassium chloride SA (K-DUR,KLOR-CON) 20 MEQ tablet Take 1 tablet (20 mEq total) by mouth daily. 30 tablet 3  . rosuvastatin (CRESTOR) 5 MG tablet Take 5 mg by mouth daily.    . traMADol (ULTRAM) 50 MG tablet Take 2 tablets (100 mg total) by mouth every 6 (six) hours as needed. for pain 240 tablet 5   No current facility-administered medications for this  visit.      Allergies:   Prozac [fluoxetine hcl]   Social History:  The patient  reports that she has never smoked. She has never used smokeless tobacco. She reports that she drinks alcohol. She reports that she does not use drugs.   Family History:   family history includes Aortic stenosis in her mother; Breast cancer (age of onset: 2) in her maternal aunt; Cancer in her father and maternal aunt; Diabetes in her mother; Melanoma in her son; Retinoblastoma in her son.    Review of Systems: Review of Systems  Constitutional: Negative.   Respiratory: Negative.   Cardiovascular: Positive for palpitations.  Gastrointestinal: Negative.   Musculoskeletal: Negative.   Neurological: Negative.   Psychiatric/Behavioral: Negative.   All other systems reviewed and are negative.    PHYSICAL EXAM: VS:  BP 122/80 (BP Location: Left Arm, Patient Position: Sitting, Cuff Size: Normal)   Pulse 81   Ht  (1.676 m)   Wt 184 lb 12 oz (83.8 kg)   BMI 29.82 kg/m  , BMI Body mass index is 29.82 kg/m. GEN: Well nourished, well developed, in no acute distress  HEENT: normal  Neck: no JVD, carotid bruits, or masses Cardiac: RRR; no murmurs, rubs, or gallops,no edema  Respiratory:  clear to auscultation bilaterally, normal work of breathing GI: soft, nontender, nondistended, + BS MS: no deformity or atrophy  Skin: warm and dry, no rash Neuro:  Strength and sensation are intact Psych: euthymic mood, full affect    Recent Labs: 11/11/2016: Hemoglobin 13.2; Platelets 270.0; TSH 1.62 12/30/2016: ALT 19; BUN 23; Creatinine, Ser 0.76; Potassium 3.3; Sodium 139    Lipid Panel Lab Results  Component Value Date   CHOL 109 12/30/2016   HDL 55.60 12/30/2016   LDLCALC 43 12/30/2016   TRIG 50.0 12/30/2016      Wt Readings from Last 3 Encounters:  01/17/17 184 lb 12 oz (83.8 kg)  11/14/16 182 lb 12.8 oz (82.9 kg)  06/04/16 185 lb (83.9 kg)       ASSESSMENT AND PLAN:  Essential  hypertension -  Recommended she try losartan 50 twice a day with HCTZ 12.5 mg daily We have changed the combination pill to separate losartan and HCTZ tablets for easier titration  Chest pain, unspecified type - Prior history of atypical chest pain, no further workup at this time  Ascending aorta dilatation (HCC) Stable aorta size 4.2 cm on recent echocardiogram Previous CT scan 4.6  Bicuspid aortic valve Bicuspid aortic valve on echocardiogram, no significant stenosis by measurements  Diabetes mellitus type 2 in obese (HCC) We have encouraged continued exercise, careful diet management in an effort to lose weight.  Palpitations Possibly from recent use of energy drinks, unable to exclude low potassium from high-dose HCTZ HCTZ dose decreased   Total encounter time more than 25 minutes  Greater than 50% was spent in counseling and coordination of care with the patient   Disposition:   F/U  12 months   Orders Placed This Encounter  Procedures  . EKG 12-Lead     Signed, Dossie Arbourim Xayvion Shirah, M.D., Ph.D. 01/17/2017  [facility], [place] [phone]

## 2017-02-05 ENCOUNTER — Other Ambulatory Visit: Payer: Self-pay

## 2017-02-06 ENCOUNTER — Ambulatory Visit: Payer: Self-pay | Admitting: Physician Assistant

## 2017-02-06 ENCOUNTER — Encounter: Payer: Self-pay | Admitting: Physician Assistant

## 2017-02-06 VITALS — BP 140/98 | HR 84 | Temp 98.7°F

## 2017-02-06 DIAGNOSIS — J069 Acute upper respiratory infection, unspecified: Secondary | ICD-10-CM

## 2017-02-06 MED ORDER — AZITHROMYCIN 250 MG PO TABS: ORAL_TABLET | ORAL | 0 refills | Status: DC

## 2017-02-06 NOTE — Progress Notes (Signed)
S: C/o cough, runny nose and congestion for 2 weeks, no fever, chills, cp/sob, v/d; mucus is tan, some wheezing noted,  Using otc meds:   O: PE: vitals wnl nad, perrl eomi, normocephalic, tms dull, nasal mucosa red and swollen, throat injected, neck supple no lymph, lungs c t a, cv rrr, neuro intact  A:  Acute  uri   P: drink fluids, continue regular meds , use otc meds of choice, return if not improving in 5 days, return earlier if worsening, zpack

## 2017-03-26 ENCOUNTER — Other Ambulatory Visit: Payer: Self-pay | Admitting: Internal Medicine

## 2017-03-26 DIAGNOSIS — Z1231 Encounter for screening mammogram for malignant neoplasm of breast: Secondary | ICD-10-CM

## 2017-03-27 ENCOUNTER — Other Ambulatory Visit: Payer: Self-pay | Admitting: Internal Medicine

## 2017-03-27 NOTE — Telephone Encounter (Signed)
Last O/V 11/14/16 Next O/V 05/14/17 Last refill 10/23/16 #60 4 refills  Ok to refill?

## 2017-03-27 NOTE — Telephone Encounter (Signed)
Printed, signed and faxed.  

## 2017-04-22 DIAGNOSIS — H5203 Hypermetropia, bilateral: Secondary | ICD-10-CM | POA: Diagnosis not present

## 2017-04-22 LAB — HM DIABETES EYE EXAM

## 2017-04-24 ENCOUNTER — Telehealth: Payer: Self-pay | Admitting: Cardiovascular Disease

## 2017-04-24 DIAGNOSIS — I77810 Thoracic aortic ectasia: Secondary | ICD-10-CM

## 2017-04-24 NOTE — Telephone Encounter (Signed)
Received email from patient as follows:  Today, 2:46 AM   Good Morning, I am hoping you can help me out as I'm trying not to have to call the office for this. Dr. Rockey Situ mentioned if I wanted my CT angiogram done and had met my deductible by the end of the year to let him know. Initially, I had one scheduled for the fall but since I had just had the echo done, we talked about how it would be fine to wait. If it would be covered, I would rather get it done before the beginning of 2019. I appreciate your help! Thank you, Courtney Lopez  Will review chart and contact patient.

## 2017-04-24 NOTE — Telephone Encounter (Signed)
There is an order from dr. Morton PetersVan Tright  Pending We can always read the result to her If she would like our office to place a different order we can do this, CT scan before end of the year

## 2017-04-25 ENCOUNTER — Encounter: Payer: Self-pay | Admitting: *Deleted

## 2017-04-25 NOTE — Telephone Encounter (Signed)
Left voicemail message to call back and sent mychart message.

## 2017-04-25 NOTE — Telephone Encounter (Signed)
Spoke with patient and she states that [facility]. Order placed and will send her mychart message with phone number to call and schedule. She was very appreciative for the call and had no further questions at this time.

## 2017-05-01 ENCOUNTER — Ambulatory Visit
Admission: RE | Admit: 2017-05-01 | Discharge: 2017-05-01 | Disposition: A | Discharge status: Home / Self Care | Payer: 59 | Source: Ambulatory Visit | Attending: Internal Medicine | Admitting: Internal Medicine

## 2017-05-01 DIAGNOSIS — Z1231 Encounter for screening mammogram for malignant neoplasm of breast: Secondary | ICD-10-CM | POA: Diagnosis not present

## 2017-05-03 ENCOUNTER — Encounter: Payer: Self-pay | Admitting: Internal Medicine

## 2017-05-08 ENCOUNTER — Ambulatory Visit
Admission: RE | Admit: 2017-05-08 | Discharge: 2017-05-08 | Disposition: A | Discharge status: Home / Self Care | Payer: 59 | Source: Ambulatory Visit | Attending: Cardiovascular Disease | Admitting: Cardiovascular Disease

## 2017-05-08 DIAGNOSIS — I712 Thoracic aortic aneurysm, without rupture: Secondary | ICD-10-CM | POA: Insufficient documentation

## 2017-05-08 DIAGNOSIS — K449 Diaphragmatic hernia without obstruction or gangrene: Secondary | ICD-10-CM | POA: Insufficient documentation

## 2017-05-08 DIAGNOSIS — E0789 Other specified disorders of thyroid: Secondary | ICD-10-CM | POA: Diagnosis not present

## 2017-05-08 DIAGNOSIS — I77810 Thoracic aortic ectasia: Secondary | ICD-10-CM

## 2017-05-08 MED ORDER — IOPAMIDOL (ISOVUE-370) INJECTION 76%
75.0000 mL | Freq: Once | INTRAVENOUS | Status: AC | PRN
  Administered 2017-05-08: 75 mL via INTRAVENOUS

## 2017-05-14 ENCOUNTER — Ambulatory Visit (INDEPENDENT_AMBULATORY_CARE_PROVIDER_SITE_OTHER): Payer: 59 | Admitting: Internal Medicine

## 2017-05-14 ENCOUNTER — Encounter: Payer: Self-pay | Admitting: Internal Medicine

## 2017-05-14 VITALS — BP 130/84 | HR 71 | Temp 97.9°F | Resp 15 | Ht 66.0 in | Wt 184.4 lb

## 2017-05-14 DIAGNOSIS — E1169 Type 2 diabetes mellitus with other specified complication: Secondary | ICD-10-CM

## 2017-05-14 DIAGNOSIS — E669 Obesity, unspecified: Secondary | ICD-10-CM

## 2017-05-14 DIAGNOSIS — Z1231 Encounter for screening mammogram for malignant neoplasm of breast: Secondary | ICD-10-CM

## 2017-05-14 DIAGNOSIS — I77810 Thoracic aortic ectasia: Secondary | ICD-10-CM | POA: Diagnosis not present

## 2017-05-14 DIAGNOSIS — E663 Overweight: Secondary | ICD-10-CM | POA: Diagnosis not present

## 2017-05-14 DIAGNOSIS — Z1239 Encounter for other screening for malignant neoplasm of breast: Secondary | ICD-10-CM

## 2017-05-14 DIAGNOSIS — I10 Essential (primary) hypertension: Secondary | ICD-10-CM | POA: Diagnosis not present

## 2017-05-14 DIAGNOSIS — E041 Nontoxic single thyroid nodule: Secondary | ICD-10-CM

## 2017-05-14 DIAGNOSIS — Z0000 Encounter for general adult medical examination without abnormal findings: Secondary | ICD-10-CM

## 2017-05-14 DIAGNOSIS — Z0001 Encounter for general adult medical examination with abnormal findings: Secondary | ICD-10-CM | POA: Diagnosis not present

## 2017-05-14 DIAGNOSIS — E785 Hyperlipidemia, unspecified: Secondary | ICD-10-CM

## 2017-05-14 LAB — COMPREHENSIVE METABOLIC PANEL
ALT: 16 U/L (ref 0–35)
AST: 19 U/L (ref 0–37)
Albumin: 4.6 g/dL (ref 3.5–5.2)
Alkaline Phosphatase: 68 U/L (ref 39–117)
BUN: 22 mg/dL (ref 6–23)
CO2: 30 mEq/L (ref 19–32)
Calcium: 9.3 mg/dL (ref 8.4–10.5)
Chloride: 101 mEq/L (ref 96–112)
Creatinine, Ser: 0.8 mg/dL (ref 0.40–1.20)
GFR: 78.21 mL/min (ref >60.00)
Glucose, Bld: 144 mg/dL — ABNORMAL HIGH (ref 70–99)
Potassium: 3.8 mEq/L (ref 3.5–5.1)
Sodium: 141 mEq/L (ref 135–145)
Total Bilirubin: 0.7 mg/dL (ref 0.2–1.2)
Total Protein: 7.4 g/dL (ref 6.0–8.3)

## 2017-05-14 LAB — LIPID PANEL
Cholesterol: 120 mg/dL (ref 0–200)
HDL: 53.6 mg/dL (ref >39.00)
LDL Cholesterol: 54 mg/dL (ref 0–99)
NonHDL: 66.04
Total CHOL/HDL Ratio: 2
Triglycerides: 59 mg/dL (ref 0.0–149.0)
VLDL: 11.8 mg/dL (ref 0.0–40.0)

## 2017-05-14 LAB — MICROALBUMIN / CREATININE URINE RATIO
Creatinine,U: 77.8 mg/dL
Microalb Creat Ratio: 1 mg/g (ref 0.0–30.0)
Microalb, Ur: 0.8 mg/dL (ref 0.0–1.9)

## 2017-05-14 LAB — TSH: TSH: 3.07 u[IU]/mL (ref 0.35–4.50)

## 2017-05-14 LAB — HEMOGLOBIN A1C: Hgb A1c MFr Bld: 6.5 % (ref 4.6–6.5)

## 2017-05-14 MED ORDER — TRAMADOL HCL 50 MG PO TABS: 100.0000 mg | ORAL_TABLET | Freq: Four times a day (QID) | ORAL | 5 refills | Status: DC | PRN

## 2017-05-14 MED ORDER — HYDROCODONE-ACETAMINOPHEN 10-325 MG PO TABS: 1.0000 | ORAL_TABLET | Freq: Two times a day (BID) | ORAL | 0 refills | Status: DC | PRN

## 2017-05-14 MED ORDER — AMLODIPINE BESYLATE 2.5 MG PO TABS: 2.5000 mg | ORAL_TABLET | Freq: Every day | ORAL | 3 refills | Status: DC

## 2017-05-14 NOTE — Patient Instructions (Addendum)
Try the " Headspace" app on your phone for relaxation  Try adding amlodipine 2.5 mg daily,  If it drops your BP too much, you can use it prn   thyroid ultrasound ordered     Health Maintenance for Postmenopausal Women Menopause is a normal process in which your reproductive ability comes to an end. This process happens gradually over a span of months to years, usually between the ages of 34 and 36. Menopause is complete when you have missed 12 consecutive menstrual periods. It is important to talk with your health care provider about some of the most common conditions that affect postmenopausal women, such as heart disease, cancer, and bone loss (osteoporosis). Adopting a healthy lifestyle and getting preventive care can help to promote your health and wellness. Those actions can also lower your chances of developing some of these common conditions. What should I know about menopause? During menopause, you may experience a number of symptoms, such as:  Moderate-to-severe hot flashes.  Night sweats.  Decrease in sex drive.  Mood swings.  Headaches.  Tiredness.  Irritability.  Memory problems.  Insomnia.  Choosing to treat or not to treat menopausal changes is an individual decision that you make with your health care provider. What should I know about hormone replacement therapy and supplements? Hormone therapy products are effective for treating symptoms that are associated with menopause, such as hot flashes and night sweats. Hormone replacement carries certain risks, especially as you become older. If you are thinking about using estrogen or estrogen with progestin treatments, discuss the benefits and risks with your health care provider. What should I know about heart disease and stroke? Heart disease, heart attack, and stroke become more likely as you age. This may be due, in part, to the hormonal changes that your body experiences during menopause. These can affect how your  body processes dietary fats, triglycerides, and cholesterol. Heart attack and stroke are both medical emergencies. There are many things that you can do to help prevent heart disease and stroke:  Have your blood pressure checked at least every 1-2 years. High blood pressure causes heart disease and increases the risk of stroke.  If you are 77-64 years old, ask your health care provider if you should take aspirin to prevent a heart attack or a stroke.  Do not use any tobacco products, including cigarettes, chewing tobacco, or electronic cigarettes. If you need help quitting, ask your health care provider.  It is important to eat a healthy diet and maintain a healthy weight. ? Be sure to include plenty of vegetables, fruits, low-fat dairy products, and lean protein. ? Avoid eating foods that are high in solid fats, added sugars, or salt (sodium).  Get regular exercise. This is one of the most important things that you can do for your health. ? Try to exercise for at least 150 minutes each week. The type of exercise that you do should increase your heart rate and make you sweat. This is known as moderate-intensity exercise. ? Try to do strengthening exercises at least twice each week. Do these in addition to the moderate-intensity exercise.  Know your numbers.Ask your health care provider to check your cholesterol and your blood glucose. Continue to have your blood tested as directed by your health care provider.  What should I know about cancer screening? There are several types of cancer. Take the following steps to reduce your risk and to catch any cancer development as early as possible. Breast Cancer  Practice  breast self-awareness. ? This means understanding how your breasts normally appear and feel. ? It also means doing regular breast self-exams. Let your health care provider know about any changes, no matter how small.  If you are 34 or older, have a clinician do a breast exam  (clinical breast exam or CBE) every year. Depending on your age, family history, and medical history, it may be recommended that you also have a yearly breast X-ray (mammogram).  If you have a family history of breast cancer, talk with your health care provider about genetic screening.  If you are at high risk for breast cancer, talk with your health care provider about having an MRI and a mammogram every year.  Breast cancer (BRCA) gene test is recommended for women who have family members with BRCA-related cancers. Results of the assessment will determine the need for genetic counseling and BRCA1 and for BRCA2 testing. BRCA-related cancers include these types: ? Breast. This occurs in males or females. ? Ovarian. ? Tubal. This may also be called fallopian tube cancer. ? Cancer of the abdominal or pelvic lining (peritoneal cancer). ? Prostate. ? Pancreatic.  Cervical, Uterine, and Ovarian Cancer Your health care provider may recommend that you be screened regularly for cancer of the pelvic organs. These include your ovaries, uterus, and vagina. This screening involves a pelvic exam, which includes checking for microscopic changes to the surface of your cervix (Pap test).  For women ages 21-65, health care providers may recommend a pelvic exam and a Pap test every three years. For women ages 59-65, they may recommend the Pap test and pelvic exam, combined with testing for human papilloma virus (HPV), every five years. Some types of HPV increase your risk of cervical cancer. Testing for HPV may also be done on women of any age who have unclear Pap test results.  Other health care providers may not recommend any screening for nonpregnant women who are considered low risk for pelvic cancer and have no symptoms. Ask your health care provider if a screening pelvic exam is right for you.  If you have had past treatment for cervical cancer or a condition that could lead to cancer, you need Pap tests  and screening for cancer for at least 20 years after your treatment. If Pap tests have been discontinued for you, your risk factors (such as having a new sexual partner) need to be reassessed to determine if you should start having screenings again. Some women have medical problems that increase the chance of getting cervical cancer. In these cases, your health care provider may recommend that you have screening and Pap tests more often.  If you have a family history of uterine cancer or ovarian cancer, talk with your health care provider about genetic screening.  If you have vaginal bleeding after reaching menopause, tell your health care provider.  There are currently no reliable tests available to screen for ovarian cancer.  Lung Cancer Lung cancer screening is recommended for adults 97-48 years old who are at high risk for lung cancer because of a history of smoking. A yearly low-dose CT scan of the lungs is recommended if you:  Currently smoke.  Have a history of at least 30 pack-years of smoking and you currently smoke or have quit within the past 15 years. A pack-year is smoking an average of one pack of cigarettes per day for one year.  Yearly screening should:  Continue until it has been 15 years since you quit.  Stop  if you develop a health problem that would prevent you from having lung cancer treatment.  Colorectal Cancer  This type of cancer can be detected and can often be prevented.  Routine colorectal cancer screening usually begins at age 24 and continues through age 20.  If you have risk factors for colon cancer, your health care provider may recommend that you be screened at an earlier age.  If you have a family history of colorectal cancer, talk with your health care provider about genetic screening.  Your health care provider may also recommend using home test kits to check for hidden blood in your stool.  A small camera at the end of a tube can be used to  examine your colon directly (sigmoidoscopy or colonoscopy). This is done to check for the earliest forms of colorectal cancer.  Direct examination of the colon should be repeated every 5-10 years until age 55. However, if early forms of precancerous polyps or small growths are found or if you have a family history or genetic risk for colorectal cancer, you may need to be screened more often.  Skin Cancer  Check your skin from head to toe regularly.  Monitor any moles. Be sure to tell your health care provider: ? About any new moles or changes in moles, especially if there is a change in a mole's shape or color. ? If you have a mole that is larger than the size of a pencil eraser.  If any of your family members has a history of skin cancer, especially at a young age, talk with your health care provider about genetic screening.  Always use sunscreen. Apply sunscreen liberally and repeatedly throughout the day.  Whenever you are outside, protect yourself by wearing long sleeves, pants, a wide-brimmed hat, and sunglasses.  What should I know about osteoporosis? Osteoporosis is a condition in which bone destruction happens more quickly than new bone creation. After menopause, you may be at an increased risk for osteoporosis. To help prevent osteoporosis or the bone fractures that can happen because of osteoporosis, the following is recommended:  If you are 23-52 years old, get at least 1,000 mg of calcium and at least 600 mg of vitamin D per day.  If you are older than age 64 but younger than age 68, get at least 1,200 mg of calcium and at least 600 mg of vitamin D per day.  If you are older than age 61, get at least 1,200 mg of calcium and at least 800 mg of vitamin D per day.  Smoking and excessive alcohol intake increase the risk of osteoporosis. Eat foods that are rich in calcium and vitamin D, and do weight-bearing exercises several times each week as directed by your health care  provider. What should I know about how menopause affects my mental health? Depression may occur at any age, but it is more common as you become older. Common symptoms of depression include:  Low or sad mood.  Changes in sleep patterns.  Changes in appetite or eating patterns.  Feeling an overall lack of motivation or enjoyment of activities that you previously enjoyed.  Frequent crying spells.  Talk with your health care provider if you think that you are experiencing depression. What should I know about immunizations? It is important that you get and maintain your immunizations. These include:  Tetanus, diphtheria, and pertussis (Tdap) booster vaccine.  Influenza every year before the flu season begins.  Pneumonia vaccine.  Shingles vaccine.  Your health  care provider may also recommend other immunizations. This information is not intended to replace advice given to you by your health care provider. Make sure you discuss any questions you have with your health care provider. Document Released: 06/21/2005 Document Revised: 11/17/2015 Document Reviewed: 01/31/2015 Elsevier Interactive Patient Education  2018 [employer].

## 2017-05-14 NOTE — Progress Notes (Signed)
Patient ID: LEVIA WALTERMIRE, female    DOB: 03-18-1961  Age: 57 y.o. MRN: [id number]  The patient is here for annual  Preventive  examination and management of other chronic and acute problems including type 2 DM,  Obesity and hypertension .  The risk factors are reflected in the social history.  The roster of all physicians providing medical care to patient - is listed in the Snapshot section of the chart.  Activities of daily living:  The patient is 100% independent in all ADLs: dressing, toileting, feeding as well as independent mobility  Lab Results  Component Value Date   HGBA1C 6.5 05/14/2017         Home safety : The patient has smoke detectors in the home. They wear seatbelts.  There are no firearms at home. There is no violence in the home.   There is no risks for hepatitis, STDs or HIV. There is no   history of blood transfusion. They have no travel history to infectious disease endemic areas of the world.  The patient has seen their dentist in the last six month. They have seen their eye doctor in the last year. They admit to slight hearing difficulty with regard to whispered voices and some television programs.  They have deferred audiologic testing in the last year.  They do not  have excessive sun exposure. Discussed the need for sun protection: hats, long sleeves and use of sunscreen if there is significant sun exposure.   Diet: the importance of a healthy diet is discussed. They do have a healthy diet.  The benefits of regular aerobic exercise were discussed. She walks 4 times per week ,  20 minutes.   Depression screen: there are no signs or vegative symptoms of depression- irritability, change in appetite, anhedonia, sadness/tearfullness.  The following portions of the patient's history were reviewed and updated as appropriate: allergies, current medications, past family history, past medical history,  past surgical history, past social history  and problem  list.  Visual acuity was not assessed per patient preference since she has regular follow up with her ophthalmologist. Hearing and body mass index were assessed and reviewed.   During the course of the visit the patient was educated and counseled about appropriate screening and preventive services including : fall prevention , diabetes screening, nutrition counseling, colorectal cancer screening, and recommended immunizations.    CC: The primary encounter diagnosis was Thyroid nodule. Diagnoses of Essential hypertension, Diabetes mellitus type 2 in obese (HCC), Overweight, Hyperlipidemia with target LDL less than 100, Encounter for preventive health examination, Ascending aorta dilatation (HCC), and Screening for breast cancer were also pertinent to this visit.  Had semi annual CT angiogram done for f u on aortic aneurysm (ascendic thoracic aorta ), the previous measurement was 4.6  Now 4.7  Stable   Did not tolerate change from valsartan to losartan.  Medication change to losartan bid 50 and hctz bid 12.5  . BP still elevated at times.  Also taking labetalol 400 mg daily in divided doses. Disussed adding amlodipine 2.5 mg daily or prn  Calcium score ver low at 78 on cardiac CT     Other findings on CT:  Thyroid nodule on the right side  noted   hiatal hernia no change  Spondylosis thoracic  Neck pain   Managed with tramadol 100 q 6 and prn vicodin   Wt gain 2 lbs     History Elwyn has a past medical history of Aortic stenosis, Aortic  valve disorders, Breast screening, unspecified, Cervicalgia, Heart murmur, Metatarsal fracture (2007), Other symptoms involving abdomen and pelvis(789.9), Retinal hemorrhage, Symptomatic menopausal or female climacteric states, and Unspecified essential hypertension.   She has a past surgical history that includes gyn surgery; Oophorectomy; Osteotomy maxillary; Appendectomy; Cesarean section; Tubal ligation; and Abdominal hysterectomy.   Her family  history includes Aortic stenosis in her mother; Breast cancer (age of onset: 6470) in her maternal aunt; Cancer in her father and maternal aunt; Diabetes in her mother; Melanoma in her son; Retinoblastoma in her son.She reports that  has never smoked. she has never used smokeless tobacco. She reports that she drinks alcohol. She reports that she does not use drugs.  Outpatient Medications Prior to Visit  Medication Sig Dispense Refill  . ALPRAZolam (XANAX) 0.25 MG tablet TAKE 1 TABLET BY MOUTH TWICE DAILY AS NEEDED 60 tablet 4  . aspirin 81 MG tablet Take 81 mg by mouth daily.    [name]. estradiol (ESTRACE) 0.1 MG/GM vaginal cream INSERT 1/4 APPLICATORFUL VAGINALLY TWO TIMES A WEEK AS DIRECTED 42.5 g 5  . fluticasone (FLONASE) 50 MCG/ACT nasal spray Place 2 sprays into both nostrils daily. 16 g 6  . hydrochlorothiazide (HYDRODIURIL) 25 MG tablet Take 1 tablet (25 mg total) by mouth daily. 90 tablet 4  . labetalol (NORMODYNE) 200 MG tablet TAKE ONE TABLET BY MOUTH 2 TIMES A DAY 180 tablet 1  . losartan (COZAAR) 100 MG tablet Take 1 tablet (100 mg total) by mouth daily. 90 tablet 4  . Menthol, Topical Analgesic, (BIOFREEZE EX) Apply topically.    . potassium chloride SA (K-DUR,KLOR-CON) 20 MEQ tablet Take 1 tablet (20 mEq total) by mouth daily. 30 tablet 3  . rosuvastatin (CRESTOR) 5 MG tablet Take 5 mg by mouth daily.    [name]. HYDROcodone-acetaminophen (NORCO) 10-325 MG tablet Take 1 tablet by mouth every 12 (twelve) hours as needed. 60 tablet 0  . traMADol (ULTRAM) 50 MG tablet Take 2 tablets (100 mg total) by mouth every 6 (six) hours as needed. for pain 240 tablet 5  . azithromycin (ZITHROMAX Z-PAK) 250 MG tablet 2 pills today then 1 pill a day for 4 days (Patient not taking: Reported on 05/14/2017) 6 each 0   No facility-administered medications prior to visit.     Review of Systems   Patient denies headache, fevers, malaise, unintentional weight loss, skin rash, eye pain, sinus congestion and sinus  pain, sore throat, dysphagia,  hemoptysis , cough, dyspnea, wheezing, chest pain, palpitations, orthopnea, edema, abdominal pain, nausea, melena, diarrhea, constipation, flank pain, dysuria, hematuria, urinary  Frequency, nocturia, numbness, tingling, seizures,  Focal weakness, Loss of consciousness,  Tremor, insomnia, depression, anxiety, and suicidal ideation.     Objective:  BP 130/84 (BP Location: Right Arm, Patient Position: Sitting, Cuff Size: Normal)   Pulse 71   Temp 97.9 F (36.6 C) (Oral)   Resp 15   Ht 5\' 6"  (1.676 m)   Wt 184 lb 6.4 oz (83.6 kg)   SpO2 98%   BMI 29.76 kg/m   Physical Exam  General appearance: alert, cooperative and appears stated age Ears: normal TM's and external ear canals both ears Throat: lips, mucosa, and tongue normal; teeth and gums normal Neck: no adenopathy, no carotid bruit, supple, symmetrical, trachea midline and thyroid not enlarged, symmetric, no tenderness/mass/nodules Back: symmetric, no curvature. ROM normal. No CVA tenderness. Lungs: clear to auscultation bilaterally Heart: regular rate and rhythm, S1, S2 normal, no murmur, click, rub or gallop Abdomen: soft, non-tender;  bowel sounds normal; no masses,  no organomegaly Pulses: 2+ and symmetric Skin: Skin color, texture, turgor normal. No rashes or lesions Lymph nodes: Cervical, supraclavicular, and axillary nodes normal.   Assessment & Plan:   Problem List Items Addressed This Visit    Ascending aorta dilatation (HCC)    Stable by  annual surveillance .  Optimal BP management addressed       Relevant Medications   amLODipine (NORVASC) 2.5 MG tablet   Diabetes mellitus type 2 in obese (HCC)     Historically well-controlled on diet alone .  hemoglobin A1c has been consistently at or  less than 7.0 . Patient is up-to-date on eye exams and foot exam is normal today. Patient has early microalbuminuria. Patient is tolerating statin therapy for CAD risk reduction and on ACE/ARB for renal  protection and hypertension   Lab Results  Component Value Date   HGBA1C 6.5 05/14/2017   Lab Results  Component Value Date   MICROALBUR 0.8 05/14/2017         Relevant Orders   Hemoglobin A1c (Completed)   Microalbumin / creatinine urine ratio (Completed)   Encounter for preventive health examination    Annual comprehensive preventive exam was done as well as an evaluation and management of chronic conditions .  During the course of the visit the patient was educated and counseled about appropriate screening and preventive services including :  diabetes screening, lipid analysis with projected  10 year  risk for CAD , nutrition counseling, breast, cervical and colorectal cancer screening, and recommended immunizations.  Printed recommendations for health maintenance screenings was given      Hyperlipidemia with target LDL less than 100    LDL is at goal on Crestor, low dose,  And LFTs are normal.   Lab Results  Component Value Date   CHOL 120 05/14/2017   HDL 53.60 05/14/2017   LDLCALC 54 05/14/2017   LDLDIRECT 101.0 11/11/2016   TRIG 59.0 05/14/2017   CHOLHDL 2 05/14/2017   Lab Results  Component Value Date   ALT 16 05/14/2017   AST 19 05/14/2017   ALKPHOS 68 05/14/2017   BILITOT 0.7 05/14/2017         Relevant Medications   amLODipine (NORVASC) 2.5 MG tablet   Hypertension    Not optimally controlled on losartan/hct 50 mg bid.  Labetalol 200 mg bid.  Adding amlodipine 2.5 mg   Lab Results  Component Value Date   CREATININE 0.80 05/14/2017   Lab Results  Component Value Date   NA 141 05/14/2017   K 3.8 05/14/2017   CL 101 05/14/2017   CO2 30 05/14/2017         Relevant Medications   amLODipine (NORVASC) 2.5 MG tablet   Other Relevant Orders   Lipid panel (Completed)   Comprehensive metabolic panel (Completed)   Overweight    I have addressed  BMI and recommended wt loss of 10% of body weigh over the next 6 months using a low glycemic index diet and  regular exercise a minimum of 5 days per week.        Screening for breast cancer    Annual mammograms have been normal       Thyroid nodule - Primary    Incidental finding.  Thyroid ultrasound ordered.  TSH is normal   Lab Results  Component Value Date   TSH 3.07 05/14/2017         Relevant Orders   TSH (Completed)   T4 (  Completed)   US Soft Tissue Head/Neck      I have discontinued Juna E. Lewallen's azithromycin. I am also having her start on amLODipine. Additionally, I am having her maintain her aspirin, (Menthol, Topical Analgesic, (BIOFREEZE EX)), estradiol, fluticasone, potassium chloride SA, labetalol, rosuvastatin, losartan, hydrochlorothiazide, ALPRAZolam, HYDROcodone-acetaminophen, and traMADol.  Meds ordered this encounter  Medications  . amLODipine (NORVASC) 2.5 MG tablet    Sig: Take 1 tablet (2.5 mg total) by mouth daily.    Dispense:  90 tablet    Refill:  3  . HYDROcodone-acetaminophen (NORCO) 10-325 MG tablet    Sig: Take 1 tablet by mouth every 12 (twelve) hours as needed.    Dispense:  60 tablet    Refill:  0  . traMADol (ULTRAM) 50 MG tablet    Sig: Take 2 tablets (100 mg total) by mouth every 6 (six) hours as needed. for pain    Dispense:  240 tablet    Refill:  5    Medications Discontinued During This Encounter  Medication Reason  . azithromycin (ZITHROMAX Z-PAK) 250 MG tablet Completed Course  . HYDROcodone-acetaminophen (NORCO) 10-325 MG tablet Reorder  . traMADol (ULTRAM) 50 MG tablet Reorder    Follow-up: Return in about 6 months (around 11/11/2017).   Sherlene Shams, MD

## 2017-05-15 DIAGNOSIS — E041 Nontoxic single thyroid nodule: Secondary | ICD-10-CM | POA: Insufficient documentation

## 2017-05-15 LAB — T4: T4, Total: 8.8 ug/dL (ref 5.1–11.9)

## 2017-05-15 NOTE — Assessment & Plan Note (Signed)
Not optimally controlled on losartan/hct 50 mg bid.  Labetalol 200 mg bid.  Adding amlodipine 2.5 mg   Lab Results  Component Value Date   CREATININE 0.80 05/14/2017   Lab Results  Component Value Date   NA 141 05/14/2017   K 3.8 05/14/2017   CL 101 05/14/2017   CO2 30 05/14/2017

## 2017-05-15 NOTE — Assessment & Plan Note (Signed)
I have addressed  BMI and recommended wt loss of 10% of body weigh over the next 6 months using a low glycemic index diet and regular exercise a minimum of 5 days per week.   

## 2017-05-15 NOTE — Assessment & Plan Note (Signed)
Annual mammograms have been normal

## 2017-05-15 NOTE — Assessment & Plan Note (Signed)
LDL is at goal on Crestor, low dose,  And LFTs are normal.   Lab Results  Component Value Date   CHOL 120 05/14/2017   HDL 53.60 05/14/2017   LDLCALC 54 05/14/2017   LDLDIRECT 101.0 11/11/2016   TRIG 59.0 05/14/2017   CHOLHDL 2 05/14/2017   Lab Results  Component Value Date   ALT 16 05/14/2017   AST 19 05/14/2017   ALKPHOS 68 05/14/2017   BILITOT 0.7 05/14/2017

## 2017-05-15 NOTE — Assessment & Plan Note (Signed)
Stable by  annual surveillance .  Optimal BP management addressed

## 2017-05-15 NOTE — Assessment & Plan Note (Signed)
Historically well-controlled on diet alone .  hemoglobin A1c has been consistently at or  less than 7.0 . Patient is up-to-date on eye exams and foot exam is normal today. Patient has early microalbuminuria. Patient is tolerating statin therapy for CAD risk reduction and on ACE/ARB for renal protection and hypertension   Lab Results  Component Value Date   HGBA1C 6.5 05/14/2017   Lab Results  Component Value Date   MICROALBUR 0.8 05/14/2017

## 2017-05-15 NOTE — Assessment & Plan Note (Signed)
Incidental finding.  Thyroid ultrasound ordered.  TSH is normal   Lab Results  Component Value Date   TSH 3.07 05/14/2017

## 2017-05-15 NOTE — Assessment & Plan Note (Signed)
Annual comprehensive preventive exam was done as well as an evaluation and management of chronic conditions .  During the course of the visit the patient was educated and counseled about appropriate screening and preventive services including :  diabetes screening, lipid analysis with projected  10 year  risk for CAD , nutrition counseling, breast, cervical and colorectal cancer screening, and recommended immunizations.  Printed recommendations for health maintenance screenings was given 

## 2017-05-20 ENCOUNTER — Ambulatory Visit: Admission: RE | Admit: 2017-05-20 | Payer: 59 | Source: Ambulatory Visit

## 2017-05-26 ENCOUNTER — Ambulatory Visit: Admission: RE | Admit: 2017-05-26 | Payer: 59 | Source: Ambulatory Visit

## 2017-05-28 ENCOUNTER — Ambulatory Visit
Admission: RE | Admit: 2017-05-28 | Discharge: 2017-05-28 | Disposition: A | Discharge status: Home / Self Care | Payer: 59 | Source: Ambulatory Visit | Attending: Internal Medicine | Admitting: Internal Medicine

## 2017-05-28 DIAGNOSIS — E041 Nontoxic single thyroid nodule: Secondary | ICD-10-CM | POA: Diagnosis not present

## 2017-05-30 ENCOUNTER — Encounter: Payer: Self-pay | Admitting: Internal Medicine

## 2017-06-02 ENCOUNTER — Other Ambulatory Visit: Payer: Self-pay | Admitting: Internal Medicine

## 2017-06-02 DIAGNOSIS — E041 Nontoxic single thyroid nodule: Secondary | ICD-10-CM

## 2017-06-02 NOTE — Progress Notes (Signed)
My Chart message sent

## 2017-06-19 ENCOUNTER — Other Ambulatory Visit: Payer: Self-pay | Admitting: Internal Medicine

## 2017-06-19 DIAGNOSIS — J069 Acute upper respiratory infection, unspecified: Secondary | ICD-10-CM

## 2017-07-10 ENCOUNTER — Other Ambulatory Visit: Payer: Self-pay | Admitting: Internal Medicine

## 2017-07-11 ENCOUNTER — Telehealth: Payer: Self-pay | Admitting: Internal Medicine

## 2017-07-11 NOTE — Telephone Encounter (Signed)
Copied from CRM [id number]. Topic: Referral - Question >> Jul 11, 2017 10:46 AM Debroah LoopLander, Lumin L wrote: Reason for CRM: [facility] calling to get clarification on labetalol (NORMODYNE) 200 MG tablet. Rep says it's indicating DAW which is the generic. Please call [phone]/

## 2017-07-11 NOTE — Telephone Encounter (Signed)
Spoke with pharmacist and explained to him that I do not see that on the rx that we faxed over yesterday. Pharmacist stated that he would change it and get the rx refilled.

## 2017-07-11 NOTE — Telephone Encounter (Signed)
[facility] needs clarification on labetalol (NORMODYNE) 200 MG tablet. Rep says it's indicating DAW which is the generic. Please call [phone]

## 2017-08-03 ENCOUNTER — Telehealth: Payer: Self-pay | Admitting: Internal Medicine

## 2017-08-03 DIAGNOSIS — E041 Nontoxic single thyroid nodule: Secondary | ICD-10-CM

## 2017-08-06 ENCOUNTER — Telehealth: Payer: Self-pay | Admitting: Cardiovascular Disease

## 2017-08-06 NOTE — Telephone Encounter (Signed)
[facility] and [facility]  Scheduled 4/2 at 820 am

## 2017-08-06 NOTE — Telephone Encounter (Signed)
Spoke with patient and she has had increased problems with dizziness and irregular heart rates. She has also had elevated heart rates. During rest she woke up with heart pounding and rate was 164. She took some labetalol and did other measures to get rate controlled. She wants to come in for further evaluation. Confirmed her appointment for next week and she was very appreciative for the call back.

## 2017-08-07 NOTE — Telephone Encounter (Signed)
REFERRAL REPEATED PER PATIENT REQUEST

## 2017-08-10 NOTE — Progress Notes (Signed)
Cardiology Office Note  Date:  08/12/2017   ID:  Courtney Lopez, DOB 02/01/1961, MRN [id number]  PCP:  Courtney Shamsullo, Teresa L, MD   Chief Complaint  Patient presents with  . other    Irregular heart beat and sob. Meds reviewed verbally with pt.    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 Stable Aortic valve also looks stable, no significant stenosis Normal cardiac function  Certain days does not feel well, Periods of SOB, worse with exertion Walking for parking lot, had to stop, trouble with steps  06/30/2017 Pain on right side, of chest EKG showing PVCs  07/25/2017 Woke up, chest pressure and pounding, HR 162 on her watch Took labetolol 15 min later, broke  labetalol 8 am and 8 pm 200 mg losartan daily HCTZ 1/2 pill twice a day Amlodipine PRN  Readings that we discussed from home good blood pressure measurements on the weekends higher on the weekdays possibly work stress  EKG personally reviewed by myself on todays visit Shows NSR with left anterior fascicular block no significant ST or T wave changes  Other past medical history reviewed Previous CT scan chest 2017 and early 2017 with dilated ascending aorta  4.6 cm Very low CT coronary calcium score    diagnosis of murmur and aortic valve disease was made as she was pregnant with her third child. Murmur persisted even after delivery. She has been seen by Dr. Juliann Paresallwood for many years with periodic echocardiogram. She has never been told it is bicuspid or tricuspid valve.   Patient has never smoked  She does report family history of SVT requiring ablation, father died at age 59 from cancer, mother with aortic valve stenosis but  was in her 8980s   PMH:   has a past medical history of Aortic stenosis, Aortic valve disorders, Breast screening, unspecified, Cervicalgia, Heart murmur, Metatarsal fracture (2007), Other symptoms involving abdomen and pelvis(789.9), Retinal hemorrhage, Symptomatic menopausal or female climacteric states, and Unspecified essential hypertension.  PSH:    Past Surgical History:  Procedure Laterality Date  . ABDOMINAL HYSTERECTOMY    . APPENDECTOMY     done during oophorectomy  . CESAREAN SECTION     x 2  . gyn surgery     hysterectomy- done for adenomysosis and endometriosis  . OOPHORECTOMY     unilateral, due to ruptured ovarian cysts  . OSTEOTOMY MAXILLARY     due to bite problems  . TUBAL LIGATION      Current Outpatient Medications  Medication Sig Dispense Refill  . ALPRAZolam (XANAX) 0.25 MG tablet TAKE 1 TABLET BY MOUTH TWICE DAILY AS NEEDED 60 tablet 4  . amLODipine (NORVASC) 2.5 MG tablet Take 1 tablet (2.5 mg total) by mouth daily. (Patient taking differently: Take 2.5 mg by mouth as needed. ) 90 tablet 3  . aspirin 81 MG tablet Take 81 mg by mouth daily.    [name]. estradiol (ESTRACE) 0.1 MG/GM vaginal cream INSERT 1/4 APPLICATORFUL VAGINALLY TWO TIMES A WEEK AS DIRECTED 42.5 g 5  . fluticasone (FLONASE) 50 MCG/ACT nasal spray PLACE 2 SPRAYS INTO BOTH NOSTRILS DAILY. 16 g 6  . hydrochlorothiazide (HYDRODIURIL) 25 MG tablet Take 1 tablet (25 mg total) by mouth daily. 90 tablet  4  . HYDROcodone-acetaminophen (NORCO) 10-325 MG tablet Take 1 tablet by mouth every 12 (twelve) hours as needed. 60 tablet 0  . labetalol (NORMODYNE) 200 MG tablet TAKE 1 TABLET BY MOUTH 2 TIMES A DAY 180 tablet 1  . losartan (COZAAR) 100 MG tablet Take 1 tablet (100 mg total) by mouth daily. 90 tablet 4  . Menthol, Topical Analgesic, (BIOFREEZE EX) Apply topically.    . potassium chloride SA (K-DUR,KLOR-CON) 20 MEQ tablet Take 1 tablet (20 mEq total) by mouth daily. (Patient taking differently: Take 10 mEq  by mouth daily. ) 30 tablet 3  . rosuvastatin (CRESTOR) 5 MG tablet Take 5 mg by mouth at bedtime.     . traMADol (ULTRAM) 50 MG tablet Take 2 tablets (100 mg total) by mouth every 6 (six) hours as needed. for pain 240 tablet 5   No current facility-administered medications for this visit.      Allergies:   Prozac [fluoxetine hcl]   Social History:  The patient  reports that she has never smoked. She has never used smokeless tobacco. She reports that she drinks alcohol. She reports that she does not use drugs.   Family History:   family history includes Aortic stenosis in her mother; Breast cancer (age of onset: 55) in her maternal aunt; Cancer in her father and maternal aunt; Diabetes in her mother; Melanoma in her son; Retinoblastoma in her son.    Review of Systems: Review of Systems  Constitutional: Negative.   Respiratory: Positive for shortness of breath.   Cardiovascular: Positive for palpitations.  Gastrointestinal: Negative.   Musculoskeletal: Negative.   Neurological: Negative.   Psychiatric/Behavioral: Negative.   All other systems reviewed and are negative.    PHYSICAL EXAM: VS:  BP (!) 130/98 (BP Location: Left Arm, Patient Position: Sitting, Cuff Size: Normal)   Pulse 67   Ht 5' 5.5" (1.664 m)   Wt 185 lb 12 oz (84.3 kg)   BMI 30.44 kg/m  , BMI Body mass index is 30.44 kg/m. Constitutional:  oriented to person, place, and time. No distress.  HENT:  Head: Normocephalic and atraumatic.  Eyes:  no discharge. No scleral icterus.  Neck: Normal range of motion. Neck supple. No JVD present.  Cardiovascular: Normal rate, regular rhythm, normal heart sounds and intact distal pulses. Exam reveals no gallop and no friction rub. No edema No murmur heard. Pulmonary/Chest: Effort normal and breath sounds normal. No stridor. No respiratory distress.  no wheezes.  no rales.  no tenderness.  Abdominal: Soft.  no distension.  no tenderness.  Musculoskeletal: Normal range of  motion.  no  tenderness or deformity.  Neurological:  normal muscle tone. Coordination normal. No atrophy Skin: Skin is warm and dry. No rash noted. not diaphoretic.  Psychiatric:  normal mood and affect. behavior is normal. Thought content normal.   Recent Labs: 11/11/2016: Hemoglobin 13.2; Platelets 270.0 05/14/2017: ALT 16; BUN 22; Creatinine, Ser 0.80; Potassium 3.8; Sodium 141; TSH 3.07    Lipid Panel Lab Results  Component Value Date   CHOL 120 05/14/2017   HDL 53.60 05/14/2017   LDLCALC 54 05/14/2017   TRIG 59.0 05/14/2017      Wt Readings from Last 3 Encounters:  08/12/17 185 lb 12 oz (84.3 kg)  05/14/17 184 lb 6.4 oz (83.6 kg)  01/17/17 184 lb 12 oz (83.8 kg)       ASSESSMENT AND PLAN:  Essential hypertension -  Long discussion concerning her blood pressures Recommend she increase labetalol up  to 200 mg 3 times a day Try to take labetalol 1 hour before going to work, night shift We will try to wean down on HCTZ as she is feeling dry, drinking a lot of water trouble with potassium at times Discussed other medications such as bystrolic  Chest pain, unspecified type - Atypical chest pain on the right, not with exertion Recommend if she has recurrent symptoms she will let us know  Ascending aorta dilatation (HCC) Stable aorta size 4.2 cm on recent echocardiogram Results discussed with her  Bicuspid aortic valve Bicuspid aortic valve on echocardiogram, no significant stenosis by measurements Stable  Diabetes mellitus type 2 in obese (HCC) We have encouraged continued exercise, careful diet management in an effort to lose weight.  Stable hemoglobin A1c 6.5  Palpitations PVCs noted on EKG per the patient when she did one at work Also episode of tachycardia rate 160 woke up middle of the night no rhythm strip available Unable to exclude SVT or atrial tachycardia We will try higher dose of labetalol at night We did discuss event monitor if she has recurrent  symptoms   Total encounter time more than 25 minutes  Greater than 50% was spent in counseling and coordination of care with the patient   Disposition:   F/U  12 months   Orders Placed This Encounter  Procedures  . EKG 12-Lead     Signed, Dossie Arbour, M.D., Ph.D. 08/12/2017  [facility] [place], [place] [phone]

## 2017-08-12 ENCOUNTER — Encounter: Payer: Self-pay | Admitting: Cardiovascular Disease

## 2017-08-12 ENCOUNTER — Ambulatory Visit (INDEPENDENT_AMBULATORY_CARE_PROVIDER_SITE_OTHER): Payer: 59 | Admitting: Cardiovascular Disease

## 2017-08-12 VITALS — BP 130/98 | HR 67 | Ht 65.5 in | Wt 185.8 lb

## 2017-08-12 DIAGNOSIS — I350 Nonrheumatic aortic (valve) stenosis: Secondary | ICD-10-CM

## 2017-08-12 DIAGNOSIS — E1169 Type 2 diabetes mellitus with other specified complication: Secondary | ICD-10-CM | POA: Diagnosis not present

## 2017-08-12 DIAGNOSIS — E669 Obesity, unspecified: Secondary | ICD-10-CM

## 2017-08-12 DIAGNOSIS — I10 Essential (primary) hypertension: Secondary | ICD-10-CM | POA: Diagnosis not present

## 2017-08-12 DIAGNOSIS — I77810 Thoracic aortic ectasia: Secondary | ICD-10-CM | POA: Diagnosis not present

## 2017-08-12 DIAGNOSIS — E785 Hyperlipidemia, unspecified: Secondary | ICD-10-CM | POA: Diagnosis not present

## 2017-08-12 DIAGNOSIS — R079 Chest pain, unspecified: Secondary | ICD-10-CM

## 2017-08-12 DIAGNOSIS — I479 Paroxysmal tachycardia, unspecified: Secondary | ICD-10-CM | POA: Diagnosis not present

## 2017-08-12 MED ORDER — LABETALOL HCL 200 MG PO TABS: 200.0000 mg | ORAL_TABLET | Freq: Three times a day (TID) | ORAL | 3 refills | Status: DC

## 2017-08-12 NOTE — Patient Instructions (Signed)
Medication Instructions:   Please take labetalol three times a day  Labwork:  No new labs needed  Testing/Procedures:  No further testing at this time   Follow-Up: It was a pleasure seeing you in the office today. Please call us if you have new issues that need to be addressed before your next appt.  [phone]  Your physician wants you to follow-up in: 12 months.  You will receive a reminder letter in the mail two months in advance. If you don't receive a letter, please call our office to schedule the follow-up appointment.  If you need a refill on your cardiac medications before your next appointment, please call your pharmacy.  For educational health videos Log in to : [email or web address] Or : [email or web address], password : [email or web address]

## 2017-08-14 ENCOUNTER — Other Ambulatory Visit: Payer: Self-pay | Admitting: Internal Medicine

## 2017-08-14 NOTE — Telephone Encounter (Signed)
Refilled: 03/27/2017 Last OV: 05/14/2017 Next OV: not scheduled

## 2017-08-15 ENCOUNTER — Other Ambulatory Visit: Payer: Self-pay | Admitting: Internal Medicine

## 2017-08-15 NOTE — Telephone Encounter (Signed)
Printed, signed and faxed.  

## 2017-09-05 ENCOUNTER — Telehealth: Payer: Self-pay | Admitting: Cardiovascular Disease

## 2017-09-05 ENCOUNTER — Encounter: Payer: Self-pay | Admitting: Cardiovascular Disease

## 2017-09-05 NOTE — Telephone Encounter (Signed)
Pt states she had to leave work yesterday due to dizziness and weakness. States she has been feeling this way all night long. Please call to discuss.

## 2017-09-05 NOTE — Telephone Encounter (Signed)
Patient reports that she was at work last night and ran up the stairs she got short of breath and felt some rhythm issues. She got a apple watch and she sent some rhythm strips. Blood pressure was elevated and she had dizziness. She reports blood pressures have been elevated during her dizzy spells. She did not have any actual blood pressure readings but states that she does monitor it frequently. Confirmed her medications and she has been taking the Labetalol 200 mg three times daily along with the losartan 100 mg once daily and no amlodipine. She felt that her blood pressures could be elevated from anxiety due to her dizziness and shortness of breath. Advised that I would send message to Dr. Mariah MillingGollan for his review and would be in touch if he should have any recommendations. She did email some of her apple watch rhythm strips to front desk for them to put in her chart. Will route this over to Dr. Mariah MillingGollan for his review.

## 2017-09-07 NOTE — Telephone Encounter (Signed)
She could try extra 1/2 dose of labelolol right before work or at work for PVCs Also we may need to do some heart conditioning We need a daily walking program, would start on flat surface, slowly work up to mild incline, before steep incline/stairs

## 2017-09-08 NOTE — Telephone Encounter (Signed)
Left voicemail message to call back  

## 2017-09-09 NOTE — Telephone Encounter (Signed)
Spoke with patient and reviewed Dr. Windell Hummingbird recommendations. She reports that on the weekends when she is at home she is not requiring as much medication. She verbalized understanding of instructions and will continue monitoring. She verbalized understanding of our conversation, agreement with plan, and had no further questions at this time.

## 2017-09-23 ENCOUNTER — Other Ambulatory Visit: Payer: Self-pay | Admitting: Cardiovascular Disease

## 2017-11-05 ENCOUNTER — Telehealth: Payer: Self-pay | Admitting: Cardiovascular Disease

## 2017-11-05 ENCOUNTER — Telehealth: Payer: Self-pay

## 2017-11-05 DIAGNOSIS — E785 Hyperlipidemia, unspecified: Secondary | ICD-10-CM

## 2017-11-05 DIAGNOSIS — E1169 Type 2 diabetes mellitus with other specified complication: Secondary | ICD-10-CM

## 2017-11-05 DIAGNOSIS — E559 Vitamin D deficiency, unspecified: Secondary | ICD-10-CM

## 2017-11-05 DIAGNOSIS — E669 Obesity, unspecified: Secondary | ICD-10-CM

## 2017-11-05 DIAGNOSIS — I712 Thoracic aortic aneurysm, without rupture, unspecified: Secondary | ICD-10-CM

## 2017-11-05 DIAGNOSIS — E041 Nontoxic single thyroid nodule: Secondary | ICD-10-CM

## 2017-11-05 DIAGNOSIS — I10 Essential (primary) hypertension: Secondary | ICD-10-CM

## 2017-11-05 DIAGNOSIS — R5383 Other fatigue: Secondary | ICD-10-CM

## 2017-11-05 NOTE — Telephone Encounter (Signed)
Patient called to schedule routine echo the fu ov with tg. No order in epic results for 2018 do not have a fu for echo time frame.   Scheduled ov September with Gollan .     Patient also wants to know how to transmit rhythm strips from apple watch to our office.    Please advise.

## 2017-11-05 NOTE — Telephone Encounter (Signed)
Copied from CRM [id number]. Topic: Appointment Scheduling - Scheduling Inquiry for Clinic >> Nov 05, 2017  9:50 AM Tamela OddiMartin, [place], NT wrote: Reason for CRM: patient called and states she is wanting to schedule CPE labs before her appt. Please call patient to schedule. CB# [phone].

## 2017-11-05 NOTE — Addendum Note (Signed)
Addended by: Sandy SalaamWOODWARD, Aiva Miskell on: 11/05/2017 02:14 PM   Modules accepted: Orders

## 2017-11-05 NOTE — Telephone Encounter (Signed)
Would probably recommend we monitor ascending aorta dilation with period CT scan It has been stable in 04/2016 to 04/2017 Could repeat CT scan in 04/2018 (earlier if she would like) Echo did not seen to show size of aorta as clearly, was 4.6 to 4.7 on CT, Only 4.2 on last echo.

## 2017-11-05 NOTE — Telephone Encounter (Signed)
Spoke with patient and she reports increased episodes of fast heart rates and bigeminy. She does have her apple watch that monitors as well and is hoping to send us those attachments for providers review. [facility] she should be able to attach those images. She also wanted to know about repeating her echocardiogram. Reviewed previous visit note and advised that it was stable with no mention of repeat. She verbalized understanding and was under the impression that she needed repeat echo every year. Advised that I would check with Dr. Mariah MillingGollan first of next week regarding order for repeat and then would be in touch with her. She verbalized understanding, agreement with plan, and had no further questions at this time.

## 2017-11-05 NOTE — Telephone Encounter (Signed)
LMTCB. Need to schedule pt a lab appt before her physical in July. Labs have been ordered. PEC may speak with pt.

## 2017-11-05 NOTE — Telephone Encounter (Signed)
Left voicemail message to call back  

## 2017-11-05 NOTE — Addendum Note (Signed)
Addended by: [name], Jadie Comas L on: 11/05/2017 04:10 PM   Modules accepted: Orders

## 2017-11-05 NOTE — Telephone Encounter (Signed)
Thanks,  I added a vitamin d

## 2017-11-05 NOTE — Telephone Encounter (Signed)
Ordered A1C, lipid, cmp, tsh, and cbc. Is therre anything else that needs to be ordered?

## 2017-11-06 NOTE — Telephone Encounter (Signed)
Left voicemail message to call back  

## 2017-11-07 NOTE — Telephone Encounter (Signed)
Left voicemail message to call back  

## 2017-11-10 NOTE — Telephone Encounter (Signed)
Spoke with patient and reviewed Dr. Windell HummingbirdGollan's recommendations to have repeat CT scan in December to monitor size of aorta. She verbalized understanding with no further questions at this time. Will place order and she will schedule closer to that time.

## 2017-11-12 NOTE — Telephone Encounter (Signed)
° °  Pt said she will come fasting on 11/17/17 because she can't come anytime this week. Would like to come Friday morning 11/14/17 but nothing is available till that afternoon.

## 2017-11-17 ENCOUNTER — Encounter: Payer: Self-pay | Admitting: Internal Medicine

## 2017-11-17 ENCOUNTER — Ambulatory Visit (INDEPENDENT_AMBULATORY_CARE_PROVIDER_SITE_OTHER): Payer: 59 | Admitting: Internal Medicine

## 2017-11-17 DIAGNOSIS — E785 Hyperlipidemia, unspecified: Secondary | ICD-10-CM

## 2017-11-17 DIAGNOSIS — R5383 Other fatigue: Secondary | ICD-10-CM | POA: Diagnosis not present

## 2017-11-17 DIAGNOSIS — E041 Nontoxic single thyroid nodule: Secondary | ICD-10-CM

## 2017-11-17 DIAGNOSIS — E669 Obesity, unspecified: Secondary | ICD-10-CM

## 2017-11-17 DIAGNOSIS — E559 Vitamin D deficiency, unspecified: Secondary | ICD-10-CM

## 2017-11-17 DIAGNOSIS — I10 Essential (primary) hypertension: Secondary | ICD-10-CM

## 2017-11-17 DIAGNOSIS — E1169 Type 2 diabetes mellitus with other specified complication: Secondary | ICD-10-CM

## 2017-11-17 MED ORDER — HYDROCODONE-ACETAMINOPHEN 10-325 MG PO TABS: 1.0000 | ORAL_TABLET | Freq: Four times a day (QID) | ORAL | 0 refills | Status: DC | PRN

## 2017-11-17 MED ORDER — SCOPOLAMINE 1 MG/3DAYS TD PT72: 1.0000 | MEDICATED_PATCH | TRANSDERMAL | 0 refills | Status: DC

## 2017-11-17 MED ORDER — ONDANSETRON 4 MG PO TBDP: 4.0000 mg | ORAL_TABLET | Freq: Three times a day (TID) | ORAL | 0 refills | Status: DC | PRN

## 2017-11-17 MED ORDER — LEVOFLOXACIN 500 MG PO TABS: 500.0000 mg | ORAL_TABLET | Freq: Every day | ORAL | 0 refills | Status: DC

## 2017-11-17 MED ORDER — TRAMADOL HCL 50 MG PO TABS: 100.0000 mg | ORAL_TABLET | Freq: Four times a day (QID) | ORAL | 5 refills | Status: DC | PRN

## 2017-11-17 NOTE — Patient Instructions (Signed)
CONGRATS ON THE WEIGHT LOSS  14LB!!    FOR YOUR TRIP    transdermal scopolamine to prevent seasickness,   rx for Levaquin for sinusitis/bronchitisi/UTI  zofran  For nausea   Take A  protective mask to use on flight if seated next to someone who is clearly sick   Flush your sinuses when you get to your hotel room   use DEET insect repellant when on land to minimize risk of  arthropod borne viral infections .

## 2017-11-17 NOTE — Progress Notes (Signed)
Subjective:  Patient ID: Courtney Lopez, female    DOB: 1961-02-14  Age: 57 y.o. MRN: [id number]  CC: Diagnoses of Vitamin D deficiency, Fatigue, unspecified type, Diabetes mellitus type 2 in obese [facility]), Thyroid nodule, Essential hypertension, and Hyperlipidemia with target LDL less than 100 were pertinent to this visit.  HPI [name] presents for follow up on hypertension, type 2 d and hyperlipidemia    Last lost 14 lbs since last visit   High Metabolism Diet   [name]  Hypertension:  Started amlodipine , BP became too low on a daily basis .  seveal episodes of arrhythmia rate 164 occurred at rest.  Finally resolved after icing her forehead and massaging her carotid.  used her Apple watch to diagnose trigeminy  And SVT ,  increased labetalol to tid . But doesn't tolerate it every day   Has noted increases in BP with regular use of vicodin so has been using it prn and getting massages   Has used only #60 in the last 6 months .   using tramadol daily    Using alprazolam previously am and pm.    Outpatient Medications Prior to Visit  Medication Sig Dispense Refill  . ALPRAZolam (XANAX) 0.25 MG tablet TAKE 1 TABLET BY MOUTH TWICE DAILY AS NEEDED 60 tablet 4  . amLODipine (NORVASC) 2.5 MG tablet Take 1 tablet (2.5 mg total) by mouth daily. (Patient taking differently: Take 2.5 mg by mouth as needed. ) 90 tablet 3  . aspirin 81 MG tablet Take 81 mg by mouth daily.    [name] estradiol (ESTRACE) 0.1 MG/GM vaginal cream INSERT 1/4 APPLICATORFUL VAGINALLY TWO TIMES A WEEK AS DIRECTED 42.5 g 5  . fluticasone (FLONASE) 50 MCG/ACT nasal spray PLACE 2 SPRAYS INTO BOTH NOSTRILS DAILY. 16 g 6  . hydrochlorothiazide (HYDRODIURIL) 25 MG tablet Take 1 tablet (25 mg total) by mouth daily. 90 tablet 4  . labetalol (NORMODYNE) 200 MG tablet Take 1 tablet (200 mg total) by mouth 3 (three) times daily. 270 tablet 3  . losartan (COZAAR) 100 MG tablet Take 1 tablet (100 mg total) by mouth  daily. 90 tablet 4  . Menthol, Topical Analgesic, (BIOFREEZE EX) Apply topically.    . potassium chloride SA (K-DUR,KLOR-CON) 20 MEQ tablet Take 1 tablet (20 mEq total) by mouth daily. (Patient taking differently: Take 10 mEq by mouth daily. ) 30 tablet 3  . rosuvastatin (CRESTOR) 5 MG tablet Take 5 mg by mouth at bedtime.     [name] HYDROcodone-acetaminophen (NORCO) 10-325 MG tablet Take 1 tablet by mouth every 12 (twelve) hours as needed. 60 tablet 0  . traMADol (ULTRAM) 50 MG tablet Take 2 tablets (100 mg total) by mouth every 6 (six) hours as needed. for pain 240 tablet 5  . rosuvastatin (CRESTOR) 10 MG tablet TAKE 1 TABLET BY MOUTH DAILY. (Patient not taking: Reported on 11/17/2017) 90 tablet 3   No facility-administered medications prior to visit.     Review of Systems;  Patient denies headache, fevers, malaise, unintentional weight loss, skin rash, eye pain, sinus congestion and sinus pain, sore throat, dysphagia,  hemoptysis , cough, dyspnea, wheezing, chest pain, palpitations, orthopnea, edema, abdominal pain, nausea, melena, diarrhea, constipation, flank pain, dysuria, hematuria, urinary  Frequency, nocturia, numbness, tingling, seizures,  Focal weakness, Loss of consciousness,  Tremor, insomnia, depression, anxiety, and suicidal ideation.      Objective:  BP (!) 130/96 (BP Location: Left Arm, Patient Position: Sitting, Cuff Size: Normal)  Pulse 71   Temp 98.1 F (36.7 C) (Oral)   Resp 15   Ht 5' 5.5" (1.664 m)   Wt 177 lb 6.4 oz (80.5 kg)   SpO2 98%   BMI 29.07 kg/m   BP Readings from Last 3 Encounters:  11/17/17 (!) 130/96  08/12/17 (!) 130/98  05/14/17 130/84    Wt Readings from Last 3 Encounters:  11/17/17 177 lb 6.4 oz (80.5 kg)  08/12/17 185 lb 12 oz (84.3 kg)  05/14/17 184 lb 6.4 oz (83.6 kg)    General appearance: alert, cooperative and appears stated age Ears: normal TM's and external ear canals both ears Throat: lips, mucosa, and tongue normal; teeth and gums  normal Neck: no adenopathy, no carotid bruit, supple, symmetrical, trachea midline and thyroid not enlarged, symmetric, no tenderness/mass/nodules Back: symmetric, no curvature. ROM normal. No CVA tenderness. Lungs: clear to auscultation bilaterally Heart: regular rate and rhythm, S1, S2 normal, no murmur, click, rub or gallop Abdomen: soft, non-tender; bowel sounds normal; no masses,  no organomegaly Pulses: 2+ and symmetric Skin: Skin color, texture, turgor normal. No rashes or lesions Lymph nodes: Cervical, supraclavicular, and axillary nodes normal.  Lab Results  Component Value Date   HGBA1C 6.4 11/17/2017   HGBA1C 6.5 05/14/2017   HGBA1C 6.5 11/11/2016    Lab Results  Component Value Date   CREATININE 0.85 11/17/2017   CREATININE 0.80 05/14/2017   CREATININE 0.76 12/30/2016    Lab Results  Component Value Date   WBC 11.0 (H) 11/17/2017   HGB 13.0 11/17/2017   HCT 38.3 11/17/2017   PLT 269.0 11/17/2017   GLUCOSE 107 (H) 11/17/2017   CHOL 120 05/14/2017   TRIG 59.0 05/14/2017   HDL 53.60 05/14/2017   LDLDIRECT 101.0 11/11/2016   LDLCALC 54 05/14/2017   ALT 15 11/17/2017   AST 19 11/17/2017   NA 137 11/17/2017   K 3.8 11/17/2017   CL 101 11/17/2017   CREATININE 0.85 11/17/2017   BUN 22 11/17/2017   CO2 28 11/17/2017   TSH 1.36 11/17/2017   HGBA1C 6.4 11/17/2017   MICROALBUR 0.8 05/14/2017    [place] Thyroid  Result Date: 05/28/2017 CLINICAL DATA:  Right nodule noted on previous CT chest EXAM: THYROID ULTRASOUND TECHNIQUE: Ultrasound examination of the thyroid gland and adjacent soft tissues was performed. COMPARISON:  CT 05/08/2017 FINDINGS: Parenchymal Echotexture: Moderately heterogenous Isthmus: 0.4 cm thickness Right lobe:   4.7 x 2.5 x 1.9 cm Left lobe: 4.4 x 1.4 x 1.4 cm _________________________________________________________ Estimated total number of nodules >/= 1 cm: 1 Number of spongiform nodules >/=  2 cm not described below (TR1): 0 Number of mixed cystic  and solid nodules >/= 1.5 cm not described below (TR2): 0 _________________________________________________________ Nodule # 1: Location: Right; Mid Maximum size: 1.9 cm; Other 2 dimensions: 1.5 x 1.5 cm Composition: solid/almost completely solid (2) Echogenicity: isoechoic (1) Shape: not taller-than-wide (0) Margins: ill-defined (0) Echogenic foci: none (0) ACR TI-RADS total points: 3. ACR TI-RADS risk category: TR3 (3 points). ACR TI-RADS recommendations: *Given size (>/= 1.5 - 2.4 cm) and appearance, a follow-up ultrasound in 1 year should be considered based on TI-RADS criteria. _________________________________________________________ IMPRESSION: 1. Solitary 1.9 cm right thyroid nodule without worrisome characteristics. Recommend 1 year follow-up surveillance ultrasound. The above is in keeping with the ACR TI-RADS recommendations - J Am Coll Radiol 2017;14:587-595. Electronically Signed   By: Corlis Leak  Hassell M.D.   On: 05/28/2017 11:41    Assessment & Plan:   Problem List Items  Addressed This Visit    Diabetes mellitus type 2 in obese (HCC)     well-controlled on diet alone .  hemoglobin A1c has been consistently at or  less than 7.0 . Patient is up-to-date on eye exams and foot exam is normal today. Patient has early microalbuminuria. Patient is tolerating statin therapy for CAD risk reduction and on ACE/ARB for renal protection and hypertension   Lab Results  Component Value Date   HGBA1C 6.4 11/17/2017   Lab Results  Component Value Date   MICROALBUR 0.8 05/14/2017         Hyperlipidemia with target LDL less than 100    LDL is at goal on Crestor, low dose,  And LFTs are normal.   Lab Results  Component Value Date   CHOL 120 05/14/2017   HDL 53.60 05/14/2017   LDLCALC 54 05/14/2017   LDLDIRECT 101.0 11/11/2016   TRIG 59.0 05/14/2017   CHOLHDL 2 05/14/2017   Lab Results  Component Value Date   ALT 15 11/17/2017   AST 19 11/17/2017   ALKPHOS 56 11/17/2017   BILITOT 0.6  11/17/2017         Hypertension    Well controlled on current regimen. Home readings are lower than her readings here Renal function stable, no changes today.      Thyroid nodule    Other Visit Diagnoses    Vitamin D deficiency       Fatigue, unspecified type         A total of 25 minutes of face to face time was spent with patient more than half of which was spent in counselling about the above mentioned conditions  and coordination of care    I have changed Ilham E. Lecomte's HYDROcodone-acetaminophen. I am also having her start on scopolamine, ondansetron, and levofloxacin. Additionally, I am having her maintain her aspirin, (Menthol, Topical Analgesic, (BIOFREEZE EX)), potassium chloride SA, rosuvastatin, losartan, hydrochlorothiazide, amLODipine, fluticasone, labetalol, ALPRAZolam, estradiol, and traMADol.  Meds ordered this encounter  Medications  . HYDROcodone-acetaminophen (NORCO) 10-325 MG tablet    Sig: Take 1 tablet by mouth every 6 (six) hours as needed.    Dispense:  120 tablet    Refill:  0  . traMADol (ULTRAM) 50 MG tablet    Sig: Take 2 tablets (100 mg total) by mouth every 6 (six) hours as needed. for pain    Dispense:  240 tablet    Refill:  5  . scopolamine (TRANSDERM-SCOP, 1.5 MG,) 1 MG/3DAYS    Sig: Place 1 patch (1.5 mg total) onto the skin every 3 (three) days.    Dispense:  6 patch    Refill:  0  . ondansetron (ZOFRAN ODT) 4 MG disintegrating tablet    Sig: Take 1 tablet (4 mg total) by mouth every 8 (eight) hours as needed for nausea or vomiting.    Dispense:  20 tablet    Refill:  0  . levofloxacin (LEVAQUIN) 500 MG tablet    Sig: Take 1 tablet (500 mg total) by mouth daily.    Dispense:  7 tablet    Refill:  0    Medications Discontinued During This Encounter  Medication Reason  . rosuvastatin (CRESTOR) 10 MG tablet Change in therapy  . HYDROcodone-acetaminophen (NORCO) 10-325 MG tablet   . traMADol (ULTRAM) 50 MG tablet Reorder     Follow-up: Return in about 6 months (around 05/20/2018) for follow up diabetes.   Sherlene Shams, MD

## 2017-11-18 LAB — CBC WITH DIFFERENTIAL/PLATELET
Basophils Absolute: 0.1 10*3/uL (ref 0.0–0.1)
Basophils Relative: 1 % (ref 0.0–3.0)
Eosinophils Absolute: 0.2 10*3/uL (ref 0.0–0.7)
Eosinophils Relative: 1.8 % (ref 0.0–5.0)
HCT: 38.3 % (ref 36.0–46.0)
Hemoglobin: 13 g/dL (ref 12.0–15.0)
Lymphocytes Relative: 24.7 % (ref 12.0–46.0)
Lymphs Abs: 2.7 10*3/uL (ref 0.7–4.0)
MCHC: 34.1 g/dL (ref 30.0–36.0)
MCV: 85.8 fl (ref 78.0–100.0)
Monocytes Absolute: 0.9 10*3/uL (ref 0.1–1.0)
Monocytes Relative: 8.2 % (ref 3.0–12.0)
Neutro Abs: 7.1 10*3/uL (ref 1.4–7.7)
Neutrophils Relative %: 64.3 % (ref 43.0–77.0)
Platelets: 269 10*3/uL (ref 150.0–400.0)
RBC: 4.46 Mil/uL (ref 3.87–5.11)
RDW: 13.2 % (ref 11.5–15.5)
WBC: 11 10*3/uL — ABNORMAL HIGH (ref 4.0–10.5)

## 2017-11-18 LAB — COMPREHENSIVE METABOLIC PANEL
ALT: 15 U/L (ref 0–35)
AST: 19 U/L (ref 0–37)
Albumin: 4.5 g/dL (ref 3.5–5.2)
Alkaline Phosphatase: 56 U/L (ref 39–117)
BUN: 22 mg/dL (ref 6–23)
CO2: 28 mEq/L (ref 19–32)
Calcium: 9.5 mg/dL (ref 8.4–10.5)
Chloride: 101 mEq/L (ref 96–112)
Creatinine, Ser: 0.85 mg/dL (ref 0.40–1.20)
GFR: 72.8 mL/min (ref >60.00)
Glucose, Bld: 107 mg/dL — ABNORMAL HIGH (ref 70–99)
Potassium: 3.8 mEq/L (ref 3.5–5.1)
Sodium: 137 mEq/L (ref 135–145)
Total Bilirubin: 0.6 mg/dL (ref 0.2–1.2)
Total Protein: 7.6 g/dL (ref 6.0–8.3)

## 2017-11-18 LAB — VITAMIN D 25 HYDROXY (VIT D DEFICIENCY, FRACTURES): VITD: 11 ng/mL — ABNORMAL LOW (ref 30.00–100.00)

## 2017-11-18 LAB — HEMOGLOBIN A1C: Hgb A1c MFr Bld: 6.4 % (ref 4.6–6.5)

## 2017-11-18 LAB — TSH: TSH: 1.36 u[IU]/mL (ref 0.35–4.50)

## 2017-11-18 NOTE — Assessment & Plan Note (Signed)
well-controlled on diet alone .  hemoglobin A1c has been consistently at or  less than 7.0 . Patient is up-to-date on eye exams and foot exam is normal today. Patient has early microalbuminuria. Patient is tolerating statin therapy for CAD risk reduction and on ACE/ARB for renal protection and hypertension   Lab Results  Component Value Date   HGBA1C 6.4 11/17/2017   Lab Results  Component Value Date   MICROALBUR 0.8 05/14/2017

## 2017-11-18 NOTE — Assessment & Plan Note (Signed)
LDL is at goal on Crestor, low dose,  And LFTs are normal.   Lab Results  Component Value Date   CHOL 120 05/14/2017   HDL 53.60 05/14/2017   LDLCALC 54 05/14/2017   LDLDIRECT 101.0 11/11/2016   TRIG 59.0 05/14/2017   CHOLHDL 2 05/14/2017   Lab Results  Component Value Date   ALT 15 11/17/2017   AST 19 11/17/2017   ALKPHOS 56 11/17/2017   BILITOT 0.6 11/17/2017

## 2017-11-18 NOTE — Assessment & Plan Note (Signed)
Well controlled on current regimen. Home readings are lower than her readings here Renal function stable, no changes today.

## 2017-11-19 ENCOUNTER — Other Ambulatory Visit: Payer: Self-pay | Admitting: Internal Medicine

## 2017-11-19 MED ORDER — ERGOCALCIFEROL 1.25 MG (50000 UT) PO CAPS: 50000.0000 [IU] | ORAL_CAPSULE | ORAL | 0 refills | Status: DC

## 2018-01-15 ENCOUNTER — Other Ambulatory Visit: Payer: Self-pay | Admitting: Internal Medicine

## 2018-01-16 NOTE — Telephone Encounter (Signed)
Med refilled.

## 2018-01-16 NOTE — Telephone Encounter (Signed)
Refilled: 08/15/2017 Last OV: 11/17/2017 Next OV: not scheduled

## 2018-01-19 ENCOUNTER — Ambulatory Visit: Payer: Self-pay | Admitting: Cardiovascular Disease

## 2018-01-27 ENCOUNTER — Other Ambulatory Visit: Payer: Self-pay | Admitting: Cardiovascular Disease

## 2018-03-08 NOTE — Progress Notes (Signed)
Cardiology Office Note  Date:  03/10/2018   ID:  Courtney Lopez, DOB [birth year], MRN [id number]  PCP:  Sherlene Shams, MD   Chief Complaint  Patient presents with  . other    Discuss echo results. Meds reviewed verbally with pt.    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, nurse at [employer] with history of   aortic valve disease/Bicuspid aortic valve,  hypertension  Mild to moderately dilated ascending aorta who presents For follow-up of her Bicuspid aortic valve, dilated ascending aorta and hypertension, PVCs  Numerous issues discussed on today's visit, Previously with tachycardia, paroxysmal tachycardia captured on her phone using pulse meter Tachycardia dramatically improved after starting vitamin D  She continues on 50,000 weekly dose Reports her shortness of breath has improved, she is exercising more, losing weight Periodic symptoms of orthostasis  Previous studies reviewed looking at aortic valve/bicuspid and dilated aorta Echo 12/2016 Dilated aorta mild 4.2 cm. Stable Aortic valve bicuspid also looks stable, mean gradient 11 mmHg Normal cardiac function  Echo December 2017 Dilated a sending aorta 4.6 cm  CT scan December 2018 Ascending aorta 4.7 cm  CT scan January 2018 ascending aorta 4.6 cm  CT scan December 2017 Aorta 4.6 cm  EKG personally reviewed by myself on todays visit Shows normal sinus rhythm rate 66 bpm left axis deviation, no other significant ST or T wave changes  Other past medical history reviewed Previous CT scan chest 2017 and early 2017 with dilated ascending aorta  4.6 cm Very low CT coronary calcium score    diagnosis of murmur and aortic valve disease was made as she was pregnant with her third child. Murmur persisted even after delivery. She has been seen by Dr. Juliann Pares for many years with periodic echocardiogram. She has never been told it is bicuspid or tricuspid valve.    never smoked  family history of SVT requiring  ablation, father died at age 76 from cancer, mother with aortic valve stenosis but was in her 68s   PMH:   has a past medical history of Aortic stenosis, Aortic valve disorders, Breast screening, unspecified, Cervicalgia, Heart murmur, Metatarsal fracture (2007), Other symptoms involving abdomen and pelvis(789.9), Retinal hemorrhage, Symptomatic menopausal or female climacteric states, and Unspecified essential hypertension.  PSH:    Past Surgical History:  Procedure Laterality Date  . ABDOMINAL HYSTERECTOMY    . APPENDECTOMY     done during oophorectomy  . CESAREAN SECTION     x 2  . gyn surgery     hysterectomy- done for adenomysosis and endometriosis  . OOPHORECTOMY     unilateral, due to ruptured ovarian cysts  . OSTEOTOMY MAXILLARY     due to bite problems  . TUBAL LIGATION      Current Outpatient Medications  Medication Sig Dispense Refill  . ALPRAZolam (XANAX) 0.25 MG tablet TAKE 1 TABLET BY MOUTH TWICE DAILY AS NEEDED 60 tablet 4  . amLODipine (NORVASC) 2.5 MG tablet Take 1 tablet (2.5 mg total) by mouth daily. (Patient taking differently: Take 2.5 mg by mouth as needed. ) 90 tablet 3  . aspirin 81 MG tablet Take 81 mg by mouth daily.    . ergocalciferol (DRISDOL) 50000 units capsule Take 1 capsule (50,000 Units total) by mouth once a week. 12 capsule 0  . estradiol (ESTRACE) 0.1 MG/GM vaginal cream INSERT 1/4 APPLICATORFUL VAGINALLY TWO TIMES A WEEK AS DIRECTED 42.5 g 5  . fluticasone (FLONASE) 50 MCG/ACT nasal spray PLACE 2  SPRAYS INTO BOTH NOSTRILS DAILY. 16 g 6  . hydrochlorothiazide (HYDRODIURIL) 25 MG tablet Take 12.5 mg by mouth daily.    [name] HYDROcodone-acetaminophen (NORCO) 10-325 MG tablet Take 1 tablet by mouth every 6 (six) hours as needed. 120 tablet 0  . labetalol (NORMODYNE) 200 MG tablet Take 200 mg by mouth 2 (two) times daily.    [name] losartan (COZAAR) 100 MG tablet TAKE 1 TABLET BY MOUTH DAILY. 90 tablet 4  . Menthol, Topical Analgesic, (BIOFREEZE EX) Apply  topically.    . potassium chloride SA (K-DUR,KLOR-CON) 20 MEQ tablet TAKE 1 TABLET BY MOUTH DAILY 30 tablet 3  . rosuvastatin (CRESTOR) 5 MG tablet Take 5 mg by mouth at bedtime.     . traMADol (ULTRAM) 50 MG tablet Take 2 tablets (100 mg total) by mouth every 6 (six) hours as needed. for pain 240 tablet 5   No current facility-administered medications for this visit.      Allergies:   Prozac [fluoxetine hcl]   Social History:  The patient  reports that she has never smoked. She has never used smokeless tobacco. She reports that she drinks alcohol. She reports that she does not use drugs.   Family History:   family history includes Aortic stenosis in her mother; Breast cancer (age of onset: 63) in her maternal aunt; Cancer in her father and maternal aunt; Diabetes in her mother; Melanoma in her son; Retinoblastoma in her son.    Review of Systems: Review of Systems  Constitutional: Negative.   Respiratory: Negative.   Cardiovascular: Negative.   Gastrointestinal: Negative.   Musculoskeletal: Negative.   Neurological: Negative.   Psychiatric/Behavioral: Negative.   All other systems reviewed and are negative.    PHYSICAL EXAM: VS:  BP 110/74   Pulse 66   Ht 5' 5.5" (1.664 m)   Wt 178 lb (80.7 kg)   SpO2 98%   BMI 29.17 kg/m  , BMI Body mass index is 29.17 kg/m. Constitutional:  oriented to person, place, and time. No distress.  HENT:  Head: Grossly normal Eyes:  no discharge. No scleral icterus.  Neck: No JVD, no carotid bruits  Cardiovascular: Regular rate and rhythm, 2/6 systolic ejection murmur right sternal border Pulmonary/Chest: Clear to auscultation bilaterally, no wheezes or rails Abdominal: Soft.  no distension.  no tenderness.  Musculoskeletal: Normal range of motion Neurological:  normal muscle tone. Coordination normal. No atrophy Skin: Skin warm and dry Psychiatric: normal affect, pleasant  Recent Labs: 11/17/2017: ALT 15; BUN 22; Creatinine, Ser 0.85;  Hemoglobin 13.0; Platelets 269.0; Potassium 3.8; Sodium 137; TSH 1.36    Lipid Panel Lab Results  Component Value Date   CHOL 120 05/14/2017   HDL 53.60 05/14/2017   LDLCALC 54 05/14/2017   TRIG 59.0 05/14/2017      Wt Readings from Last 3 Encounters:  03/10/18 178 lb (80.7 kg)  11/17/17 177 lb 6.4 oz (80.5 kg)  08/12/17 185 lb 12 oz (84.3 kg)     ASSESSMENT AND PLAN:  Essential hypertension -  Blood pressure is well controlled on today's visit. No changes made to the medications. May need to decrease the losartan dose given symptoms of orthostasis She will continue to monitor for now  Chest pain, unspecified type - No significant chest pain, no further work-up needed at this time  Ascending aorta dilatation (HCC) Most study showing stable 4.6 cm dilated ascending aorta Stable on several CT scans At her request CT scan chest with contrast ordered for December 2019  Bicuspid aortic valve Bicuspid aortic valve on echocardiogram, minimal stenosis by measurements Stable Repeat echocardiogram every several years  Diabetes mellitus type 2 in obese (HCC)   Stable hemoglobin A1c 6.4 Recommended low carbohydrate diet She is exercising more  Palpitations Denies having significant palpitations or tachycardia on her current regiment Also attributes much of the improvement to starting high-dose vitamin D Less shortness of breath, more active, no exercising  Low vitamin D Level of 11 three months ago She has follow-up with primary care early 2020   Total encounter time more than 25 minutes  Greater than 50% was spent in counseling and coordination of care with the patient  Disposition:   F/U  12 months   Orders Placed This Encounter  Procedures  . CT ANGIO CHEST AORTA W &/OR WO CONTRAST  . Basic metabolic panel  . EKG 12-Lead     Signed, Dossie Arbour, M.D., Ph.D. 03/10/2018  [facility] [place], [place] [phone]

## 2018-03-10 ENCOUNTER — Ambulatory Visit (INDEPENDENT_AMBULATORY_CARE_PROVIDER_SITE_OTHER): Payer: 59 | Admitting: Cardiovascular Disease

## 2018-03-10 ENCOUNTER — Encounter: Payer: Self-pay | Admitting: Cardiovascular Disease

## 2018-03-10 VITALS — BP 110/74 | HR 66 | Ht 65.5 in | Wt 178.0 lb

## 2018-03-10 DIAGNOSIS — I479 Paroxysmal tachycardia, unspecified: Secondary | ICD-10-CM | POA: Diagnosis not present

## 2018-03-10 DIAGNOSIS — R079 Chest pain, unspecified: Secondary | ICD-10-CM | POA: Diagnosis not present

## 2018-03-10 DIAGNOSIS — E669 Obesity, unspecified: Secondary | ICD-10-CM

## 2018-03-10 DIAGNOSIS — E785 Hyperlipidemia, unspecified: Secondary | ICD-10-CM | POA: Diagnosis not present

## 2018-03-10 DIAGNOSIS — I77810 Thoracic aortic ectasia: Secondary | ICD-10-CM

## 2018-03-10 DIAGNOSIS — E1169 Type 2 diabetes mellitus with other specified complication: Secondary | ICD-10-CM | POA: Diagnosis not present

## 2018-03-10 DIAGNOSIS — Q231 Congenital insufficiency of aortic valve: Secondary | ICD-10-CM

## 2018-03-10 DIAGNOSIS — I350 Nonrheumatic aortic (valve) stenosis: Secondary | ICD-10-CM | POA: Diagnosis not present

## 2018-03-10 NOTE — Patient Instructions (Addendum)
Medication Instructions:  No changes  If you need a refill on your cardiac medications before your next appointment, please call your pharmacy.    Lab work: Your physician recommends that you return for lab work WITHIN 30 DAYS OF THE CT. -BMET Please go to the [facility]. You will check in at the front desk to the right as you walk into the atrium. Valet Parking is offered if needed.     If you have labs (blood work) drawn today and your tests are completely normal, you will receive your results only by: [name] MyChart Message (if you have MyChart) OR . A paper copy in the mail If you have any lab test that is abnormal or we need to change your treatment, we will call you to review the results.   Testing/Procedures: We will order a chest CTA(with contrast), for ascending aorta dilation/aneurysm LOCATION: [facility]    [address], [address]   [place], [place] [address] TIME/DATE: 05/04/18, Arrival time of 0845 am. Clear Liquids only 4 hours before the test.   Follow-Up: At [facility], you and your health needs are our priority.  As part of our continuing mission to provide you with exceptional heart care, we have created designated Provider Care Teams.  These Care Teams include your primary Cardiologist (physician) and Advanced Practice Providers (APPs -  Physician Assistants and Nurse Practitioners) who all work together to provide you with the care you need, when you need it. . You will need a follow up appointment in 12 month   Please call our office 2 months in advance to schedule this appointment.   . Providers on your designated Care Team:   . Nicolasa Ducking, NP . Eula Listen, PA-C . Marisue Ivan, PA-C  Any Other Special Instructions Will Be Listed Below (If Applicable).  For educational health videos Log in to : [email or web address] Or : [email or web address], password : [email or web address]

## 2018-03-19 ENCOUNTER — Other Ambulatory Visit: Payer: Self-pay | Admitting: Internal Medicine

## 2018-03-19 DIAGNOSIS — J069 Acute upper respiratory infection, unspecified: Secondary | ICD-10-CM

## 2018-03-19 DIAGNOSIS — E041 Nontoxic single thyroid nodule: Secondary | ICD-10-CM | POA: Diagnosis not present

## 2018-03-20 ENCOUNTER — Other Ambulatory Visit: Payer: Self-pay | Admitting: "Endocrinology

## 2018-03-20 DIAGNOSIS — E041 Nontoxic single thyroid nodule: Secondary | ICD-10-CM

## 2018-04-22 ENCOUNTER — Ambulatory Visit
Admission: RE | Admit: 2018-04-22 | Discharge: 2018-04-22 | Disposition: A | Discharge status: Home / Self Care | Payer: 59 | Source: Ambulatory Visit | Attending: "Endocrinology | Admitting: "Endocrinology

## 2018-04-22 DIAGNOSIS — E041 Nontoxic single thyroid nodule: Secondary | ICD-10-CM | POA: Diagnosis not present

## 2018-04-23 DIAGNOSIS — H5203 Hypermetropia, bilateral: Secondary | ICD-10-CM | POA: Diagnosis not present

## 2018-04-23 LAB — HM DIABETES EYE EXAM

## 2018-04-28 ENCOUNTER — Other Ambulatory Visit: Payer: Self-pay | Admitting: Internal Medicine

## 2018-04-28 DIAGNOSIS — Z1231 Encounter for screening mammogram for malignant neoplasm of breast: Secondary | ICD-10-CM

## 2018-04-30 ENCOUNTER — Encounter: Payer: Self-pay | Admitting: Internal Medicine

## 2018-05-01 ENCOUNTER — Other Ambulatory Visit
Admission: RE | Admit: 2018-05-01 | Discharge: 2018-05-01 | Disposition: A | Discharge status: Home / Self Care | Payer: 59 | Attending: Cardiovascular Disease | Admitting: Cardiovascular Disease

## 2018-05-01 DIAGNOSIS — I77810 Thoracic aortic ectasia: Secondary | ICD-10-CM | POA: Insufficient documentation

## 2018-05-01 LAB — BASIC METABOLIC PANEL
Anion gap: 9 (ref 5–15)
BUN: 25 mg/dL — ABNORMAL HIGH (ref 6–20)
CO2: 28 mmol/L (ref 22–32)
Calcium: 9.1 mg/dL (ref 8.9–10.3)
Chloride: 99 mmol/L (ref 98–111)
Creatinine, Ser: 1.08 mg/dL — ABNORMAL HIGH (ref 0.44–1.00)
GFR calc Af Amer: >60 mL/min (ref >60)
GFR calc non Af Amer: 56 mL/min — ABNORMAL LOW (ref >60)
Glucose, Bld: 127 mg/dL — ABNORMAL HIGH (ref 70–99)
Potassium: 3.5 mmol/L (ref 3.5–5.1)
Sodium: 136 mmol/L (ref 135–145)

## 2018-05-04 ENCOUNTER — Ambulatory Visit
Admission: RE | Admit: 2018-05-04 | Discharge: 2018-05-04 | Disposition: A | Discharge status: Home / Self Care | Payer: 59 | Source: Ambulatory Visit | Attending: Cardiovascular Disease | Admitting: Cardiovascular Disease

## 2018-05-04 ENCOUNTER — Other Ambulatory Visit: Payer: Self-pay | Admitting: Cardiovascular Disease

## 2018-05-04 ENCOUNTER — Telehealth: Payer: Self-pay | Admitting: *Deleted

## 2018-05-04 DIAGNOSIS — I77810 Thoracic aortic ectasia: Secondary | ICD-10-CM | POA: Diagnosis not present

## 2018-05-04 DIAGNOSIS — I712 Thoracic aortic aneurysm, without rupture: Secondary | ICD-10-CM | POA: Diagnosis not present

## 2018-05-04 MED ORDER — IOPAMIDOL (ISOVUE-370) INJECTION 76%
75.0000 mL | Freq: Once | INTRAVENOUS | Status: AC | PRN
  Administered 2018-05-04: 75 mL via INTRAVENOUS

## 2018-05-04 NOTE — Telephone Encounter (Signed)
Please review for refill, Thanks !  

## 2018-05-04 NOTE — Telephone Encounter (Signed)
Patient returning call States that she typically takes 12.5 MG - 1 tablet daily  But for 4 days last week patient took 25 MG in order to get BP down Please call to discuss any additional information

## 2018-05-04 NOTE — Telephone Encounter (Signed)
Results called to pt. Pt verbalized understanding. States she did take 25 mg for about 4 days in a row because her BP was running high. She thinks that may be why the labs are the way they are. States she will try to take the HCTZ 12.5 mg every other day. She has appointment in January with her PCP who usually checks lab work as well. Patient will let us know if any new questions or concerns arise.

## 2018-05-04 NOTE — Telephone Encounter (Signed)
See previous phone encounter from earlier today. This has been discussed with patient along with results. Will send in refill how ordered today from result note.

## 2018-05-04 NOTE — Telephone Encounter (Signed)
Lmovm to verify how pt is taking HCTZ.

## 2018-05-04 NOTE — Telephone Encounter (Signed)
-----   Message from Antonieta Ibaimothy J Gollan, MD sent at 05/03/2018 11:23 AM EST ----- Labs ok, potassium low (normal) If blood pressure would allow, could consider HCTZ every other day Stay on potassium daily Or keep everything the way it is

## 2018-05-14 ENCOUNTER — Telehealth: Payer: Self-pay | Admitting: *Deleted

## 2018-05-14 NOTE — Telephone Encounter (Signed)
-----   Message from Courtney Iba, MD sent at 05/10/2018 12:32 PM EST ----- CT scan chest ascending thoracic aorta reaching maximal diameter of 4.8 cm. No change in the past few years No other new findings noted, Overall good news

## 2018-05-14 NOTE — Telephone Encounter (Signed)
Left voicemail message for patient to call back for results.  

## 2018-05-15 ENCOUNTER — Ambulatory Visit (INDEPENDENT_AMBULATORY_CARE_PROVIDER_SITE_OTHER): Payer: 59 | Admitting: Internal Medicine

## 2018-05-15 ENCOUNTER — Encounter: Payer: Self-pay | Admitting: Internal Medicine

## 2018-05-15 VITALS — BP 150/84 | HR 70 | Wt 180.4 lb

## 2018-05-15 DIAGNOSIS — E785 Hyperlipidemia, unspecified: Secondary | ICD-10-CM | POA: Diagnosis not present

## 2018-05-15 DIAGNOSIS — E559 Vitamin D deficiency, unspecified: Secondary | ICD-10-CM | POA: Diagnosis not present

## 2018-05-15 DIAGNOSIS — E1169 Type 2 diabetes mellitus with other specified complication: Secondary | ICD-10-CM

## 2018-05-15 DIAGNOSIS — I350 Nonrheumatic aortic (valve) stenosis: Secondary | ICD-10-CM | POA: Diagnosis not present

## 2018-05-15 DIAGNOSIS — E669 Obesity, unspecified: Secondary | ICD-10-CM

## 2018-05-15 DIAGNOSIS — I77810 Thoracic aortic ectasia: Secondary | ICD-10-CM | POA: Diagnosis not present

## 2018-05-15 DIAGNOSIS — I10 Essential (primary) hypertension: Secondary | ICD-10-CM

## 2018-05-15 LAB — LIPID PANEL
Cholesterol: 126 mg/dL (ref 0–200)
HDL: 53.3 mg/dL (ref >39.00)
LDL Cholesterol: 57 mg/dL (ref 0–99)
NonHDL: 72.62
Total CHOL/HDL Ratio: 2
Triglycerides: 76 mg/dL (ref 0.0–149.0)
VLDL: 15.2 mg/dL (ref 0.0–40.0)

## 2018-05-15 LAB — VITAMIN D 25 HYDROXY (VIT D DEFICIENCY, FRACTURES): VITD: 36.97 ng/mL (ref 30.00–100.00)

## 2018-05-15 LAB — COMPREHENSIVE METABOLIC PANEL
ALT: 14 U/L (ref 0–35)
AST: 18 U/L (ref 0–37)
Albumin: 4.6 g/dL (ref 3.5–5.2)
Alkaline Phosphatase: 75 U/L (ref 39–117)
BUN: 26 mg/dL — ABNORMAL HIGH (ref 6–23)
CO2: 28 mEq/L (ref 19–32)
Calcium: 9.9 mg/dL (ref 8.4–10.5)
Chloride: 101 mEq/L (ref 96–112)
Creatinine, Ser: 0.92 mg/dL (ref 0.40–1.20)
GFR: 66.33 mL/min (ref >60.00)
Glucose, Bld: 118 mg/dL — ABNORMAL HIGH (ref 70–99)
Potassium: 4 mEq/L (ref 3.5–5.1)
Sodium: 140 mEq/L (ref 135–145)
Total Bilirubin: 0.3 mg/dL (ref 0.2–1.2)
Total Protein: 7.8 g/dL (ref 6.0–8.3)

## 2018-05-15 LAB — MICROALBUMIN / CREATININE URINE RATIO
Creatinine,U: 119.4 mg/dL
Microalb Creat Ratio: 5 mg/g (ref 0.0–30.0)
Microalb, Ur: 6 mg/dL — ABNORMAL HIGH (ref 0.0–1.9)

## 2018-05-15 LAB — HEMOGLOBIN A1C: Hgb A1c MFr Bld: 6.5 % (ref 4.6–6.5)

## 2018-05-15 MED ORDER — HYDROCODONE-ACETAMINOPHEN 10-325 MG PO TABS: 1.0000 | ORAL_TABLET | Freq: Four times a day (QID) | ORAL | 0 refills | Status: DC | PRN

## 2018-05-15 MED ORDER — CARVEDILOL 12.5 MG PO TABS: 12.5000 mg | ORAL_TABLET | Freq: Two times a day (BID) | ORAL | 3 refills | Status: DC

## 2018-05-15 NOTE — Patient Instructions (Addendum)
Stop the hctz and the amlodipine  changing losartan to telmisartan 80 mg once you are done with current supply  Changing labetalol to carvedilol 12.5 mg bid   25 hour bp monitor once the switch is made   .  The ShingRx vaccine is now available in local pharmacies and is much more protective than the old one  Zostavax  (it is about 97%  Effective in preventing shingles). .   It is therefore ADVISED for all interested adults over 50 to prevent shingles so I have printed you a prescription for it.  (it requires a 2nd dose 2 too 6 months after the first one) .  It will cause you to have flu  like symptoms for 2 days    Health Maintenance for Postmenopausal Women Menopause is a normal process in which your reproductive ability comes to an end. This process happens gradually over a span of months to years, usually between the ages of 22 and 6. Menopause is complete when you have missed 12 consecutive menstrual periods. It is important to talk with your health care provider about some of the most common conditions that affect postmenopausal women, such as heart disease, cancer, and bone loss (osteoporosis). Adopting a healthy lifestyle and getting preventive care can help to promote your health and wellness. Those actions can also lower your chances of developing some of these common conditions. What should I know about menopause? During menopause, you may experience a number of symptoms, such as:  Moderate-to-severe hot flashes.  Night sweats.  Decrease in sex drive.  Mood swings.  Headaches.  Tiredness.  Irritability.  Memory problems.  Insomnia. Choosing to treat or not to treat menopausal changes is an individual decision that you make with your health care provider. What should I know about hormone replacement therapy and supplements? Hormone therapy products are effective for treating symptoms that are associated with menopause, such as hot flashes and night sweats. Hormone  replacement carries certain risks, especially as you become older. If you are thinking about using estrogen or estrogen with progestin treatments, discuss the benefits and risks with your health care provider. What should I know about heart disease and stroke? Heart disease, heart attack, and stroke become more likely as you age. This may be due, in part, to the hormonal changes that your body experiences during menopause. These can affect how your body processes dietary fats, triglycerides, and cholesterol. Heart attack and stroke are both medical emergencies. There are many things that you can do to help prevent heart disease and stroke:  Have your blood pressure checked at least every 1-2 years. High blood pressure causes heart disease and increases the risk of stroke.  If you are 62-7 years old, ask your health care provider if you should take aspirin to prevent a heart attack or a stroke.  Do not use any tobacco products, including cigarettes, chewing tobacco, or electronic cigarettes. If you need help quitting, ask your health care provider.  It is important to eat a healthy diet and maintain a healthy weight. ? Be sure to include plenty of vegetables, fruits, low-fat dairy products, and lean protein. ? Avoid eating foods that are high in solid fats, added sugars, or salt (sodium).  Get regular exercise. This is one of the most important things that you can do for your health. ? Try to exercise for at least 150 minutes each week. The type of exercise that you do should increase your heart rate and make you sweat.  This is known as moderate-intensity exercise. ? Try to do strengthening exercises at least twice each week. Do these in addition to the moderate-intensity exercise.  Know your numbers.Ask your health care provider to check your cholesterol and your blood glucose. Continue to have your blood tested as directed by your health care provider.  What should I know about cancer  screening? There are several types of cancer. Take the following steps to reduce your risk and to catch any cancer development as early as possible. Breast Cancer  Practice breast self-awareness. ? This means understanding how your breasts normally appear and feel. ? It also means doing regular breast self-exams. Let your health care provider know about any changes, no matter how small.  If you are 64 or older, have a clinician do a breast exam (clinical breast exam or CBE) every year. Depending on your age, family history, and medical history, it may be recommended that you also have a yearly breast X-ray (mammogram).  If you have a family history of breast cancer, talk with your health care provider about genetic screening.  If you are at high risk for breast cancer, talk with your health care provider about having an MRI and a mammogram every year.  Breast cancer (BRCA) gene test is recommended for women who have family members with BRCA-related cancers. Results of the assessment will determine the need for genetic counseling and BRCA1 and for BRCA2 testing. BRCA-related cancers include these types: ? Breast. This occurs in males or females. ? Ovarian. ? Tubal. This may also be called fallopian tube cancer. ? Cancer of the abdominal or pelvic lining (peritoneal cancer). ? Prostate. ? Pancreatic. Cervical, Uterine, and Ovarian Cancer Your health care provider may recommend that you be screened regularly for cancer of the pelvic organs. These include your ovaries, uterus, and vagina. This screening involves a pelvic exam, which includes checking for microscopic changes to the surface of your cervix (Pap test).  For women ages 21-65, health care providers may recommend a pelvic exam and a Pap test every three years. For women ages 41-65, they may recommend the Pap test and pelvic exam, combined with testing for human papilloma virus (HPV), every five years. Some types of HPV increase your  risk of cervical cancer. Testing for HPV may also be done on women of any age who have unclear Pap test results.  Other health care providers may not recommend any screening for nonpregnant women who are considered low risk for pelvic cancer and have no symptoms. Ask your health care provider if a screening pelvic exam is right for you.  If you have had past treatment for cervical cancer or a condition that could lead to cancer, you need Pap tests and screening for cancer for at least 20 years after your treatment. If Pap tests have been discontinued for you, your risk factors (such as having a new sexual partner) need to be reassessed to determine if you should start having screenings again. Some women have medical problems that increase the chance of getting cervical cancer. In these cases, your health care provider may recommend that you have screening and Pap tests more often.  If you have a family history of uterine cancer or ovarian cancer, talk with your health care provider about genetic screening.  If you have vaginal bleeding after reaching menopause, tell your health care provider.  There are currently no reliable tests available to screen for ovarian cancer. Lung Cancer Lung cancer screening is recommended for adults  49-59 years old who are at high risk for lung cancer because of a history of smoking. A yearly low-dose CT scan of the lungs is recommended if you:  Currently smoke.  Have a history of at least 30 pack-years of smoking and you currently smoke or have quit within the past 15 years. A pack-year is smoking an average of one pack of cigarettes per day for one year. Yearly screening should:  Continue until it has been 15 years since you quit.  Stop if you develop a health problem that would prevent you from having lung cancer treatment. Colorectal Cancer  This type of cancer can be detected and can often be prevented.  Routine colorectal cancer screening usually begins  at age 26 and continues through age 91.  If you have risk factors for colon cancer, your health care provider may recommend that you be screened at an earlier age.  If you have a family history of colorectal cancer, talk with your health care provider about genetic screening.  Your health care provider may also recommend using home test kits to check for hidden blood in your stool.  A small camera at the end of a tube can be used to examine your colon directly (sigmoidoscopy or colonoscopy). This is done to check for the earliest forms of colorectal cancer.  Direct examination of the colon should be repeated every 5-10 years until age 9. However, if early forms of precancerous polyps or small growths are found or if you have a family history or genetic risk for colorectal cancer, you may need to be screened more often. Skin Cancer  Check your skin from head to toe regularly.  Monitor any moles. Be sure to tell your health care provider: ? About any new moles or changes in moles, especially if there is a change in a mole's shape or color. ? If you have a mole that is larger than the size of a pencil eraser.  If any of your family members has a history of skin cancer, especially at a young age, talk with your health care provider about genetic screening.  Always use sunscreen. Apply sunscreen liberally and repeatedly throughout the day.  Whenever you are outside, protect yourself by wearing long sleeves, pants, a wide-brimmed hat, and sunglasses. What should I know about osteoporosis? Osteoporosis is a condition in which bone destruction happens more quickly than new bone creation. After menopause, you may be at an increased risk for osteoporosis. To help prevent osteoporosis or the bone fractures that can happen because of osteoporosis, the following is recommended:  If you are 23-28 years old, get at least 1,000 mg of calcium and at least 600 mg of vitamin D per day.  If you are older  than age 28 but younger than age 106, get at least 1,200 mg of calcium and at least 600 mg of vitamin D per day.  If you are older than age 79, get at least 1,200 mg of calcium and at least 800 mg of vitamin D per day. Smoking and excessive alcohol intake increase the risk of osteoporosis. Eat foods that are rich in calcium and vitamin D, and do weight-bearing exercises several times each week as directed by your health care provider. What should I know about how menopause affects my mental health? Depression may occur at any age, but it is more common as you become older. Common symptoms of depression include:  Low or sad mood.  Changes in sleep patterns.  Changes  in appetite or eating patterns.  Feeling an overall lack of motivation or enjoyment of activities that you previously enjoyed.  Frequent crying spells. Talk with your health care provider if you think that you are experiencing depression. What should I know about immunizations? It is important that you get and maintain your immunizations. These include:  Tetanus, diphtheria, and pertussis (Tdap) booster vaccine.  Influenza every year before the flu season begins.  Pneumonia vaccine.  Shingles vaccine. Your health care provider may also recommend other immunizations. This information is not intended to replace advice given to you by your health care provider. Make sure you discuss any questions you have with your health care provider. Document Released: 06/21/2005 Document Revised: 11/17/2015 Document Reviewed: 01/31/2015 Elsevier Interactive Patient Education  2019 [employer].

## 2018-05-15 NOTE — Progress Notes (Signed)
Subjective:  Patient ID: Courtney Lopez, female    DOB: October 20, 1960  Age: 58 y.o. MRN: [id number]  CC: The primary encounter diagnosis was Vitamin D deficiency. Diagnoses of Diabetes mellitus type 2 in obese (HCC), Hyperlipidemia with target LDL less than 100, Aortic valve stenosis, etiology of cardiac valve disease unspecified, Ascending aorta dilatation (HCC), and Essential hypertension were also pertinent to this visit.  HPI Courtney Lopez presents for follow up on GAD , hypertension with aortic root dilatation hyperlipidemia and diet controlled DM and overweight.   She has achieved a 4 lb weight loss  with recent initiation of weight watchers program.   Aorta dilation has increased slightly from 4.6 cm to 4.8 cm  BUN/Cr elevated  n Dec 20.  Has reduced hctz 25 mg  To every other day since then and has gained  4 lb since then .  has been taking 5 mg amlodipine ,  100 mg losartan and labetalol 200 mg bid.   Has not had any trigeminy since replacing the vitamin D   Chronic pain:  She has limited her vicodin use to one tablet daily Refill history confirmed via [place] Controlled Substance databas, accessed by me today..    Outpatient Medications Prior to Visit  Medication Sig Dispense Refill  . ALPRAZolam (XANAX) 0.25 MG tablet TAKE 1 TABLET BY MOUTH TWICE DAILY AS NEEDED 60 tablet 4  . aspirin 81 MG tablet Take 81 mg by mouth daily.    . ergocalciferol (DRISDOL) 50000 units capsule Take 1 capsule (50,000 Units total) by mouth once a week. 12 capsule 0  . estradiol (ESTRACE) 0.1 MG/GM vaginal cream INSERT 1/4 APPLICATORFUL VAGINALLY TWO TIMES A WEEK AS DIRECTED 42.5 g 5  . fluticasone (FLONASE) 50 MCG/ACT nasal spray PLACE 2 SPRAYS INTO BOTH NOSTRILS DAILY. 16 g 6  . losartan (COZAAR) 100 MG tablet TAKE 1 TABLET BY MOUTH DAILY. 90 tablet 4  . Menthol, Topical Analgesic, (BIOFREEZE EX) Apply topically.    . rosuvastatin (CRESTOR) 5 MG tablet Take 5 mg by mouth at bedtime.     .  traMADol (ULTRAM) 50 MG tablet Take 2 tablets (100 mg total) by mouth every 6 (six) hours as needed. for pain 240 tablet 5  . amLODipine (NORVASC) 2.5 MG tablet Take 1 tablet (2.5 mg total) by mouth daily. (Patient taking differently: Take 2.5 mg by mouth as needed. ) 90 tablet 3  . hydrochlorothiazide (HYDRODIURIL) 25 MG tablet Take 0.5 tablets (12.5 mg total) by mouth every other day. 45 tablet 3  . HYDROcodone-acetaminophen (NORCO) 10-325 MG tablet Take 1 tablet by mouth every 6 (six) hours as needed. 120 tablet 0  . labetalol (NORMODYNE) 200 MG tablet Take 200 mg by mouth 2 (two) times daily.    . potassium chloride SA (K-DUR,KLOR-CON) 20 MEQ tablet TAKE 1 TABLET BY MOUTH DAILY 30 tablet 3   No facility-administered medications prior to visit.     Review of Systems;  Patient denies headache, fevers, malaise, unintentional weight loss, skin rash, eye pain, sinus congestion and sinus pain, sore throat, dysphagia,  hemoptysis , cough, dyspnea, wheezing, chest pain, palpitations, orthopnea, edema, abdominal pain, nausea, melena, diarrhea, constipation, flank pain, dysuria, hematuria, urinary  Frequency, nocturia, numbness, tingling, seizures,  Focal weakness, Loss of consciousness,  Tremor, insomnia, depression, anxiety, and suicidal ideation.      Objective:  BP (!) 150/84 (BP Location: Left Arm, Patient Position: Sitting, Cuff Size: Normal)   Pulse 70   Wt 180 lb  6.4 oz (81.8 kg)   SpO2 98%   BMI 29.56 kg/m   BP Readings from Last 3 Encounters:  05/15/18 (!) 150/84  03/10/18 110/74  11/17/17 (!) 130/96    Wt Readings from Last 3 Encounters:  05/15/18 180 lb 6.4 oz (81.8 kg)  03/10/18 178 lb (80.7 kg)  11/17/17 177 lb 6.4 oz (80.5 kg)    General appearance: alert, cooperative and appears stated age Ears: normal TM's and external ear canals both ears Throat: lips, mucosa, and tongue normal; teeth and gums normal Neck: no adenopathy, no carotid bruit, supple, symmetrical,  trachea midline and thyroid not enlarged, symmetric, no tenderness/mass/nodules Back: symmetric, no curvature. ROM normal. No CVA tenderness. Lungs: clear to auscultation bilaterally Heart: regular rate and rhythm, S1, S2 normal, no murmur, click, rub or gallop Abdomen: soft, non-tender; bowel sounds normal; no masses,  no organomegaly Pulses: 2+ and symmetric Skin: Skin color, texture, turgor normal. No rashes or lesions Lymph nodes: Cervical, supraclavicular, and axillary nodes normal.  Lab Results  Component Value Date   HGBA1C 6.5 05/15/2018   HGBA1C 6.4 11/17/2017   HGBA1C 6.5 05/14/2017    Lab Results  Component Value Date   CREATININE 0.92 05/15/2018   CREATININE 1.08 (H) 05/01/2018   CREATININE 0.85 11/17/2017    Lab Results  Component Value Date   WBC 11.0 (H) 11/17/2017   HGB 13.0 11/17/2017   HCT 38.3 11/17/2017   PLT 269.0 11/17/2017   GLUCOSE 118 (H) 05/15/2018   CHOL 126 05/15/2018   TRIG 76.0 05/15/2018   HDL 53.30 05/15/2018   LDLDIRECT 101.0 11/11/2016   LDLCALC 57 05/15/2018   ALT 14 05/15/2018   AST 18 05/15/2018   NA 140 05/15/2018   K 4.0 05/15/2018   CL 101 05/15/2018   CREATININE 0.92 05/15/2018   BUN 26 (H) 05/15/2018   CO2 28 05/15/2018   TSH 1.36 11/17/2017   HGBA1C 6.5 05/15/2018   MICROALBUR 6.0 (H) 05/15/2018    Ct Angio Chest Aorta W &/or Wo Contrast  Result Date: 05/04/2018 CLINICAL DATA:  Follow-up of aneurysmal disease of the ascending thoracic aorta. EXAM: CT ANGIOGRAPHY CHEST WITH CONTRAST TECHNIQUE: Multidetector CT imaging of the chest was performed using the standard protocol during bolus administration of intravenous contrast. Multiplanar CT image reconstructions and MIPs were obtained to evaluate the vascular anatomy. CONTRAST:  75mL ISOVUE-370 IOPAMIDOL (ISOVUE-370) INJECTION 76% COMPARISON:  05/08/2017 and 05/15/2016. FINDINGS: Cardiovascular: The aortic root at the level of the sinuses of Valsalva continues to show normal  diameter of approximately 3.8-3.9 cm. There is gradual aneurysmal dilatation of the ascending thoracic aorta reaching maximal diameter of 4.8 cm. This is a stable diameter when re-measuring maximal diameter in a similar axis on the 2 prior contrast enhanced CTA studies. The proximal arch measures 3.4 cm and the distal arch 2.5 cm. The descending thoracic aorta measures 2.3 cm. Proximal great vessels show normal patency and branching anatomy. No evidence of aortic dissection, penetrating ulcer disease or significant atherosclerosis. The heart size is within normal limits. There may be underlying left ventricular hypertrophy. Mild calcified coronary artery plaque present in the distribution of the left circumflex and distal LAD. No pericardial fluid identified. Central pulmonary arteries are normal in caliber. Upper abdomen shows normal variant separate origins of left gastric, splenic and common hepatic arteries off of the abdominal aorta. Mediastinum/Nodes: No lymphadenopathy identified. Stable appearance of roughly 1.9 cm right thyroid nodule which has been previously characterized by thyroid ultrasound. Stable small hiatal hernia.  Lungs/Pleura: There is no evidence of pulmonary edema, consolidation, pneumothorax, nodule or pleural fluid. Upper Abdomen: No acute abnormality. Musculoskeletal: No chest wall abnormality. No acute or significant osseous findings. Review of the MIP images confirms the above findings. IMPRESSION: 1. Stable aneurysmal disease of the ascending thoracic aorta over the last 2 years. Maximal diameter is approximately 4.8 cm. This is a stable diameter when making comparable measurements on prior studies. 2. Stable coronary atherosclerosis in appearance of probable left ventricular hypertrophy. 3. Stable CT appearance of right thyroid nodule which has been characterized and followed by ultrasound. 4. Stable small hiatal hernia. Aortic aneurysm NOS (ICD10-I71.9). Electronically Signed   By:  Irish Lack M.D.   On: 05/04/2018 12:43    Assessment & Plan:   Problem List Items Addressed This Visit    Aortic stenosis    LV function is normal and valve circumference had not changed by last ECO Sept 2018      Relevant Medications   carvedilol (COREG) 12.5 MG tablet   Ascending aorta dilatation (HCC)    Mild ,  With minimal but measurable increase by recent ultrasound.       Relevant Medications   carvedilol (COREG) 12.5 MG tablet   Diabetes mellitus type 2 in obese (HCC)     well-controlled on diet alone .  hemoglobin A1c has been consistently at or  less than 7.0 . Patient is up-to-date on eye exams and foot exam is normal today. Patient has early microalbuminuria. Patient is tolerating statin therapy for CAD risk reduction and on ACE/ARB for renal protection and hypertension   Lab Results  Component Value Date   HGBA1C 6.5 05/15/2018   Lab Results  Component Value Date   MICROALBUR 6.0 (H) 05/15/2018         Relevant Orders   Hemoglobin A1c (Completed)   Comprehensive metabolic panel (Completed)   Microalbumin / creatinine urine ratio (Completed)   Hyperlipidemia with target LDL less than 100   Relevant Medications   carvedilol (COREG) 12.5 MG tablet   Other Relevant Orders   Lipid panel (Completed)   Hypertension    Changing regimen to simplify side effects .  Stopping amlodipine and hctz.  Starting carvedilol 12.5 mg bid  and stopping labetalol.  Changing losartan to telmisartan 80 mg daily       Relevant Medications   carvedilol (COREG) 12.5 MG tablet    Other Visit Diagnoses    Vitamin D deficiency    -  Primary   Relevant Orders   VITAMIN D 25 Hydroxy (Vit-D Deficiency, Fractures) (Completed)      I have discontinued Yuritza E. Rainey's amLODipine, potassium chloride SA, labetalol, and hydrochlorothiazide. I am also having her start on carvedilol. Additionally, I am having her maintain her aspirin, (Menthol, Topical Analgesic, (BIOFREEZE  EX)), rosuvastatin, estradiol, traMADol, ergocalciferol, ALPRAZolam, losartan, fluticasone, and HYDROcodone-acetaminophen.  Meds ordered this encounter  Medications  . carvedilol (COREG) 12.5 MG tablet    Sig: Take 1 tablet (12.5 mg total) by mouth 2 (two) times daily with a meal.    Dispense:  60 tablet    Refill:  3  . HYDROcodone-acetaminophen (NORCO) 10-325 MG tablet    Sig: Take 1 tablet by mouth every 6 (six) hours as needed.    Dispense:  120 tablet    Refill:  0    Medications Discontinued During This Encounter  Medication Reason  . amLODipine (NORVASC) 2.5 MG tablet   . labetalol (NORMODYNE) 200 MG tablet   .  potassium chloride SA (K-DUR,KLOR-CON) 20 MEQ tablet   . hydrochlorothiazide (HYDRODIURIL) 25 MG tablet   . HYDROcodone-acetaminophen (NORCO) 10-325 MG tablet Reorder    Follow-up: No follow-ups on file.   Sherlene Shams, MD

## 2018-05-17 NOTE — Assessment & Plan Note (Signed)
well-controlled on diet alone .  hemoglobin A1c has been consistently at or  less than 7.0 . Patient is up-to-date on eye exams and foot exam is normal today. Patient has early microalbuminuria. Patient is tolerating statin therapy for CAD risk reduction and on ACE/ARB for renal protection and hypertension   Lab Results  Component Value Date   HGBA1C 6.5 05/15/2018   Lab Results  Component Value Date   MICROALBUR 6.0 (H) 05/15/2018

## 2018-05-17 NOTE — Assessment & Plan Note (Signed)
Changing regimen to simplify side effects .  Stopping amlodipine and hctz.  Starting carvedilol 12.5 mg bid  and stopping labetalol.  Changing losartan to telmisartan 80 mg daily

## 2018-05-17 NOTE — Assessment & Plan Note (Signed)
Mild ,  With minimal but measurable increase by recent ultrasound.

## 2018-05-17 NOTE — Assessment & Plan Note (Signed)
LV function is normal and valve circumference had not changed by last ECO Sept 2018

## 2018-05-19 ENCOUNTER — Ambulatory Visit: Payer: Self-pay

## 2018-05-19 NOTE — Telephone Encounter (Signed)
IT TAKES ABOUT A WEEK FOR BLOOD PRESSURES TO STABILIZE AFTER A MEDICATION CHANGE.  SHE CAN TAKE AN EXTRA CARVEDILOL DOSE IF IT HAS BEEN 4 HOURS AFTER LAST DOSE AND STILL ELEVATED TO SYSTOLIC > 160 , BUT NOT IF LOWER  THAN THAT.   IF AFTER ONE WEEK OF the NEW REGIMEN (CARVEDILOL 12.5 MG TWICE DAILY , HCTZ 12.5 MG DAILY  AND LOSARTAN 100 MG DAILY) she is still running between 140 and 190,  She should increase the carvedilol to 25 mg tiwce daily)

## 2018-05-19 NOTE — Telephone Encounter (Signed)
Spoke with patient and reviewed results of her CT. She verbalized understanding with no further questions at this time.

## 2018-05-19 NOTE — Telephone Encounter (Signed)
Spoke with pt and advised her of the medication directions below. The pt stated that she was told during her office visit to stop the HCTZ but that she did take it last night because her blood pressure was so high. Does pt need to go back to taking the hctz daily, every other day or not at all? Pt stated that she checks mychart regularly and it would be okay to send her a mychart message in regards to message.

## 2018-05-19 NOTE — Telephone Encounter (Signed)
Patient called and says that she was seen in the office on Friday and she was started on Coreg 12.5 mg. She says that while she wasn't working over the weekend, her BP ran 140's-150's/high 80's. She says she went to work last night and before going, her BP was 146/84 with taking her prescribed medications. She says while at work, she felt dizzy, checked her BP and it was up to 190/98, so she came home and took an extra dose of Coreg 12.5 and HCTZ 12.5 mg. She says she went to sleep and woke up in the middle of the night, which was about 5 hours after taking the medicines, and her BP was 132/82. This morning's BP 146/80. She is asking for some guidelines to follow when her BP is high, asking should she take extra Carvedilol or what should she do. I advised I will send this to Dr. Darrick Huntsman and someone will call back with her recommendation, patient verbalized understanding.  Reason for Disposition . Caller has NON-URGENT medication question about med that PCP prescribed and triager unable to answer question  Protocols used: MEDICATION QUESTION CALL-A-AH

## 2018-05-19 NOTE — Telephone Encounter (Signed)
My Chart message sent

## 2018-05-26 ENCOUNTER — Ambulatory Visit
Admission: RE | Admit: 2018-05-26 | Discharge: 2018-05-26 | Disposition: A | Discharge status: Home / Self Care | Payer: 59 | Source: Ambulatory Visit | Attending: Internal Medicine | Admitting: Internal Medicine

## 2018-05-26 DIAGNOSIS — Z1231 Encounter for screening mammogram for malignant neoplasm of breast: Secondary | ICD-10-CM | POA: Insufficient documentation

## 2018-06-11 ENCOUNTER — Other Ambulatory Visit: Payer: Self-pay | Admitting: Internal Medicine

## 2018-06-11 MED ORDER — CARVEDILOL 25 MG PO TABS: 25.0000 mg | ORAL_TABLET | Freq: Two times a day (BID) | ORAL | 1 refills | Status: DC

## 2018-06-11 MED ORDER — HYDROCHLOROTHIAZIDE 25 MG PO TABS: 25.0000 mg | ORAL_TABLET | Freq: Every day | ORAL | 3 refills | Status: DC

## 2018-06-19 ENCOUNTER — Other Ambulatory Visit: Payer: Self-pay | Admitting: Internal Medicine

## 2018-08-25 MED FILL — POTASSIUM CHLORIDE CRYS ER: 20 | 30 days supply | Qty: 30 | Fill #0

## 2018-08-25 MED FILL — CARVEDILOL 25 MG TABLET: 25 | 90 days supply | Qty: 180 | Fill #0

## 2018-08-25 MED FILL — HYDROCHLOROTHIAZIDE 25 MG T: 25 | 90 days supply | Qty: 45 | Fill #0

## 2018-09-02 MED FILL — traMADol HCL 50 MG TABS: 50 | 30 days supply | Qty: 240 | Fill #0 | Status: TO

## 2018-09-17 ENCOUNTER — Other Ambulatory Visit: Payer: Self-pay | Admitting: Internal Medicine

## 2018-09-17 MED FILL — ALPRAZolam 0.25 MG TABS: 0.25 | 30 days supply | Qty: 60 | Fill #0 | Status: TO

## 2018-09-17 MED FILL — FLUTICASONE PROP 50 MCG SPR: 50 | 30 days supply | Qty: 16 | Fill #0

## 2018-09-17 MED FILL — ESTRADIOL 0.1 MG/GM CREA: 0.1 | 30 days supply | Qty: 43 | Fill #0

## 2018-10-21 ENCOUNTER — Emergency Department
Admission: EM | Admit: 2018-10-21 | Discharge: 2018-10-21 | Disposition: A | Discharge status: Home / Self Care | Payer: 59 | Attending: Internal Medicine | Admitting: Internal Medicine

## 2018-10-21 ENCOUNTER — Emergency Department: Payer: 59

## 2018-10-21 ENCOUNTER — Encounter: Payer: Self-pay | Admitting: Emergency Medicine

## 2018-10-21 ENCOUNTER — Other Ambulatory Visit: Payer: Self-pay

## 2018-10-21 DIAGNOSIS — Q231 Congenital insufficiency of aortic valve: Secondary | ICD-10-CM

## 2018-10-21 DIAGNOSIS — E669 Obesity, unspecified: Secondary | ICD-10-CM | POA: Insufficient documentation

## 2018-10-21 DIAGNOSIS — Z79899 Other long term (current) drug therapy: Secondary | ICD-10-CM | POA: Insufficient documentation

## 2018-10-21 DIAGNOSIS — H539 Unspecified visual disturbance: Secondary | ICD-10-CM

## 2018-10-21 DIAGNOSIS — R079 Chest pain, unspecified: Secondary | ICD-10-CM | POA: Insufficient documentation

## 2018-10-21 DIAGNOSIS — I479 Paroxysmal tachycardia, unspecified: Secondary | ICD-10-CM | POA: Insufficient documentation

## 2018-10-21 DIAGNOSIS — I161 Hypertensive emergency: Secondary | ICD-10-CM | POA: Diagnosis not present

## 2018-10-21 DIAGNOSIS — R51 Headache: Secondary | ICD-10-CM | POA: Diagnosis not present

## 2018-10-21 DIAGNOSIS — I10 Essential (primary) hypertension: Secondary | ICD-10-CM | POA: Insufficient documentation

## 2018-10-21 DIAGNOSIS — Z7982 Long term (current) use of aspirin: Secondary | ICD-10-CM | POA: Diagnosis not present

## 2018-10-21 DIAGNOSIS — E1169 Type 2 diabetes mellitus with other specified complication: Secondary | ICD-10-CM | POA: Insufficient documentation

## 2018-10-21 DIAGNOSIS — H538 Other visual disturbances: Secondary | ICD-10-CM | POA: Insufficient documentation

## 2018-10-21 DIAGNOSIS — I712 Thoracic aortic aneurysm, without rupture, unspecified: Secondary | ICD-10-CM | POA: Insufficient documentation

## 2018-10-21 DIAGNOSIS — I701 Atherosclerosis of renal artery: Secondary | ICD-10-CM | POA: Diagnosis not present

## 2018-10-21 DIAGNOSIS — I674 Hypertensive encephalopathy: Secondary | ICD-10-CM | POA: Diagnosis not present

## 2018-10-21 HISTORY — DX: Type 2 diabetes mellitus without complications: E11.9

## 2018-10-21 LAB — CBC
HCT: 37.9 % (ref 36.0–46.0)
Hemoglobin: 12.7 g/dL (ref 12.0–15.0)
MCH: 28.3 pg (ref 26.0–34.0)
MCHC: 33.5 g/dL (ref 30.0–36.0)
MCV: 84.6 fL (ref 80.0–100.0)
Platelets: 247 10*3/uL (ref 150–400)
RBC: 4.48 MIL/uL (ref 3.87–5.11)
RDW: 12 % (ref 11.5–15.5)
WBC: 10.1 10*3/uL (ref 4.0–10.5)
nRBC: 0 % (ref 0.0–0.2)

## 2018-10-21 LAB — COMPREHENSIVE METABOLIC PANEL
ALT: 14 U/L (ref 0–44)
AST: 17 U/L (ref 15–41)
Albumin: 4.3 g/dL (ref 3.5–5.0)
Alkaline Phosphatase: 83 U/L (ref 38–126)
Anion gap: 10 (ref 5–15)
BUN: 31 mg/dL — ABNORMAL HIGH (ref 6–20)
CO2: 30 mmol/L (ref 22–32)
Calcium: 9.5 mg/dL (ref 8.9–10.3)
Chloride: 99 mmol/L (ref 98–111)
Creatinine, Ser: 0.75 mg/dL (ref 0.44–1.00)
GFR calc Af Amer: >60 mL/min (ref >60)
GFR calc non Af Amer: >60 mL/min (ref >60)
Glucose, Bld: 201 mg/dL — ABNORMAL HIGH (ref 70–99)
Potassium: 4 mmol/L (ref 3.5–5.1)
Sodium: 139 mmol/L (ref 135–145)
Total Bilirubin: 0.5 mg/dL (ref 0.3–1.2)
Total Protein: 8.1 g/dL (ref 6.5–8.1)

## 2018-10-21 LAB — DIFFERENTIAL
Abs Immature Granulocytes: 0.04 10*3/uL (ref 0.00–0.07)
Basophils Absolute: 0.1 10*3/uL (ref 0.0–0.1)
Basophils Relative: 1 %
Eosinophils Absolute: 0.2 10*3/uL (ref 0.0–0.5)
Eosinophils Relative: 2 %
Immature Granulocytes: 0 %
Lymphocytes Relative: 29 %
Lymphs Abs: 2.9 10*3/uL (ref 0.7–4.0)
Monocytes Absolute: 0.9 10*3/uL (ref 0.1–1.0)
Monocytes Relative: 9 %
Neutro Abs: 6 10*3/uL (ref 1.7–7.7)
Neutrophils Relative %: 59 %

## 2018-10-21 LAB — APTT: aPTT: 32 seconds (ref 24–36)

## 2018-10-21 LAB — PROTIME-INR
INR: 1 (ref 0.8–1.2)
Prothrombin Time: 12.6 seconds (ref 11.4–15.2)

## 2018-10-21 LAB — TROPONIN I: Troponin I: <0.03 ng/mL (ref <0.03)

## 2018-10-21 MED ORDER — AMLODIPINE BESYLATE 5 MG PO TABS
10.0000 mg | ORAL_TABLET | Freq: Every day | ORAL | Status: DC
  Administered 2018-10-21: 10 mg via ORAL
  Filled 2018-10-21: qty 2

## 2018-10-21 MED ORDER — HYDROCHLOROTHIAZIDE 12.5 MG PO CAPS
12.5000 mg | ORAL_CAPSULE | Freq: Every day | ORAL | Status: DC
  Administered 2018-10-21: 12.5 mg via ORAL
  Filled 2018-10-21: qty 1

## 2018-10-21 MED ORDER — LABETALOL HCL 5 MG/ML IV SOLN
INTRAVENOUS | Status: AC
  Administered 2018-10-21: 10 mg via INTRAVENOUS
  Filled 2018-10-21: qty 4

## 2018-10-21 MED ORDER — LABETALOL HCL 5 MG/ML IV SOLN
10.0000 mg | Freq: Once | INTRAVENOUS | Status: AC
  Administered 2018-10-21: 10 mg via INTRAVENOUS

## 2018-10-21 MED ORDER — IOPAMIDOL (ISOVUE-370) INJECTION 76%
100.0000 mL | Freq: Once | INTRAVENOUS | Status: AC | PRN
  Administered 2018-10-21: 100 mL via INTRAVENOUS

## 2018-10-21 MED ORDER — CARVEDILOL 25 MG PO TABS
25.0000 mg | ORAL_TABLET | Freq: Two times a day (BID) | ORAL | Status: DC
  Administered 2018-10-21: 25 mg via ORAL
  Filled 2018-10-21: qty 1

## 2018-10-21 MED ORDER — NITROGLYCERIN 2 % TD OINT
1.0000 [in_us] | TOPICAL_OINTMENT | Freq: Once | TRANSDERMAL | Status: AC
  Administered 2018-10-21: 1 [in_us] via TOPICAL

## 2018-10-21 MED ORDER — AMLODIPINE BESYLATE 10 MG PO TABS: 10.0000 mg | ORAL_TABLET | Freq: Every day | ORAL | 0 refills | Status: DC

## 2018-10-21 MED ORDER — GADOBUTROL 1 MMOL/ML IV SOLN
8.0000 mL | Freq: Once | INTRAVENOUS | Status: AC | PRN
  Administered 2018-10-21: 8 mL via INTRAVENOUS

## 2018-10-21 MED ORDER — ACETAMINOPHEN 325 MG PO TABS
650.0000 mg | ORAL_TABLET | Freq: Once | ORAL | Status: AC
  Administered 2018-10-21: 650 mg via ORAL
  Filled 2018-10-21: qty 2

## 2018-10-21 NOTE — ED Notes (Signed)
Patient transported to CT 

## 2018-10-21 NOTE — ED Notes (Signed)
10mg  Labetalol given per Dr. Owens Shark.

## 2018-10-21 NOTE — ED Notes (Signed)
1 inch nitro paste placed.

## 2018-10-21 NOTE — ED Provider Notes (Signed)
-----------------------------------------   1:23 PM on 10/21/2018 -----------------------------------------  Report was given to the hospitalist by the prior physician Dr. Owens Shark at the beginning of my shift.  I was advised by Dr. Benjie Karvonen from the hospitalist service that this patient is ready for discharge.  Prescription for amlodipine was sent by her as well.  The patient states she has spoken to Dr. Benjie Karvonen who informed her of the discharge, and the patient is aware of the plan, discharge instructions, and return precautions.   Arta Silence, MD 10/21/18 1327

## 2018-10-21 NOTE — ED Triage Notes (Addendum)
Patient to ER for c/o change in vision in left eye. Patient states she was reading at the time and didn't think the screen looked right (could see fine if she covered left eye). Started at approx 0430. States last few days has had issues with BP fluctuating and just hasn't felt well. +Slight headache currently.  States when she woke up for work this afternoon, her BP was very low. Noticed BP was high tonight, went ahead and took Coreg.

## 2018-10-21 NOTE — Discharge Instructions (Signed)
Follow-up as instructed above.  Return to the ER for any new, worsening, or recurrent symptoms that concern you.

## 2018-10-21 NOTE — Consult Note (Addendum)
Medical Consultation  Courtney SenterStephanie E Lopez UXL:[id number] DOB: 09/05/1960 DOA: 10/21/2018 PCP: [name], Teresa L, MD   Requesting physician: Dr Manson PasseyBrown Date of consultation: 10/21/2018 Reason for consultation: Elevated blood pressure  Impression/Recommendations 58 year old female with history of bicuspid aortic valve and thoracic aneurysm who presented to the emergency room due to elevated blood pressure and blurred vision.  1.  Accelerated hypertension: Plan to restart outpatient medications. Case discussed with Dr. and he will see patient while patient is in the ER. Consideration of CT chest abdomen and pelvis to rule out dissection. If CT does not show acute pathology patient may be safely discharged home with outpatient follow-up with Dr. Mariah MillingGollan. Add Norvasc 2.5 mg daily to current regimen.  2.  Blurred vision: This is due to problem #1.  MRI brain was negative.  Needs outpatient OPTHO appointment today.  Chief Complaint: blurred vision HPI:   58 year old female with history of bicuspid aortic valve and descending aneurysm who presented to the emergency room due to blurred vision of her left eye.  Patient reports over the past several weeks she has had fluctuations in her blood pressure.  Yesterday morning the blood pressure was low and she did not take her blood pressure medications.  When she arrived to the emergency room her systolic blood pressure was 244/121.  She denies chest pain or shortness of breath.  Blurred vision has improved.  She had no focal neurological deficits.  Review of Systems  Constitutional: Negative for fever, chills weight loss HENT: Negative for ear pain, nosebleeds, congestion, facial swelling, rhinorrhea, neck pain, neck stiffness and ear discharge.   +blurred vision no vision loss   Respiratory: Negative for cough, shortness of breath, wheezing  Cardiovascular: Negative for chest pain, palpitations and leg swelling.  Gastrointestinal: Negative for  heartburn, abdominal pain, vomiting, diarrhea or consitpation Genitourinary: Negative for dysuria, urgency, frequency, hematuria Musculoskeletal: Negative for back pain or joint pain Neurological: Negative for dizziness, seizures, syncope, focal weakness,  numbness and headaches.  Hematological: Does not bruise/bleed easily.  Psychiatric/Behavioral: Negative for hallucinations, confusion, dysphoric mood   Past Medical History:  Diagnosis Date  . Aortic stenosis   . Aortic valve disorders   . Breast screening, unspecified   . Cervicalgia   . Heart murmur   . Metatarsal fracture 2007   5th, from wearing high heels  . Other symptoms involving abdomen and pelvis(789.9)   . Retinal hemorrhage   . Symptomatic menopausal or female climacteric states   . Unspecified essential hypertension    Past Surgical History:  Procedure Laterality Date  . ABDOMINAL HYSTERECTOMY    . APPENDECTOMY     done during oophorectomy  . CESAREAN SECTION     x 2  . gyn surgery     hysterectomy- done for adenomysosis and endometriosis  . OOPHORECTOMY     unilateral, due to ruptured ovarian cysts  . OSTEOTOMY MAXILLARY     due to bite problems  . TUBAL LIGATION     Social History:  reports that she has never smoked. She has never used smokeless tobacco. She reports current alcohol use. She reports that she does not use drugs.  Allergies  Allergen Reactions  . Prozac [Fluoxetine Hcl] Palpitations   Family History  Problem Relation Age of Onset  . Diabetes Mother   . Aortic stenosis Mother   . Cancer Father        leukemia  . Retinoblastoma Son   . Melanoma Son   . Cancer Maternal Aunt  breast  . Breast cancer Maternal Aunt 70    Prior to Admission medications   Medication Sig Start Date End Date Taking? Authorizing Provider  ALPRAZolam Prudy Feeler(XANAX) 0.25 MG tablet TAKE 1 TABLET BY MOUTH TWICE DAILY AS NEEDED 06/20/18  Yes [name], Teresa L, MD  aspirin 81 MG tablet Take 81 mg by mouth daily.    Yes [provider]  carvedilol (COREG) 25 MG tablet Take 1 tablet (25 mg total) by mouth 2 (two) times daily with a meal. 06/11/18  Yes [name], Teresa L, MD  cholecalciferol (VITAMIN D3) 25 MCG (1000 UT) tablet Take 2,000 Units by mouth daily.   Yes [provider]  estradiol (ESTRACE) 0.1 MG/GM vaginal cream INSERT 1/4 APPLICATORFUL VAGINALLY TWO TIMES A WEEK AS DIRECTED 09/17/18  Yes [name], Teresa L, MD  fluticasone (FLONASE) 50 MCG/ACT nasal spray PLACE 2 SPRAYS INTO BOTH NOSTRILS DAILY. 03/19/18  Yes [name], Teresa L, MD  hydrochlorothiazide (HYDRODIURIL) 25 MG tablet Take 1 tablet (25 mg total) by mouth daily. Patient taking differently: Take 12.5 mg by mouth daily.  06/11/18  Yes [name], Teresa L, MD  HYDROcodone-acetaminophen (NORCO) 10-325 MG tablet Take 1 tablet by mouth every 6 (six) hours as needed. 05/15/18  Yes [name], Teresa L, MD  losartan (COZAAR) 100 MG tablet TAKE 1 TABLET BY MOUTH DAILY. 01/27/18  Yes Gollan, Tollie Pizzaimothy J, MD  Menthol, Topical Analgesic, (BIOFREEZE EX) Apply topically.   Yes [provider]  rosuvastatin (CRESTOR) 5 MG tablet Take 5 mg by mouth at bedtime.    Yes [provider]  traMADol (ULTRAM) 50 MG tablet TAKE 2 TABLETS BY MOUTH EVERY 6 HOURS AS NEEDED FOR PAIN 06/20/18  Yes [name], Teresa L, MD    Physical Exam: Blood pressure (!) 181/113, pulse 74, temperature 97.9 F (36.6 C), temperature source Oral, resp. rate 17, height 5\' 6"  (1.676 m), weight 80.7 kg, SpO2 98 %. @VITALS2 @ [employer]   10/21/18 0558  Weight: 80.7 kg   No intake or output data in the 24 hours ending 10/21/18 1020   Constitutional: Appears well-developed and well-nourished. No distress. HENT: Normocephalic. [name]. Oropharynx is clear and moist.  Eyes: Conjunctivae and EOM are normal. PERRLA, no scleral icterus.  Neck: Normal ROM. Neck supple. No JVD. No tracheal deviation. CVS: RRR, S1/S2 +, 2/6 murmurs, no gallops, no carotid bruit.  Pulmonary: Effort and  breath sounds normal, no stridor, rhonchi, wheezes, rales.  Abdominal: Soft. BS +,  no distension, tenderness, rebound or guarding.  Musculoskeletal: Normal range of motion. No edema and no tenderness.  Neuro: Alert. CN 2-12 grossly intact. No focal deficits. Skin: Skin is warm and dry. No rash noted. Psychiatric: Normal mood and affect.    Labs  Basic Metabolic Panel: Recent Labs  Lab 10/21/18 0601  NA 139  K 4.0  CL 99  CO2 30  GLUCOSE 201*  BUN 31*  CREATININE 0.75  CALCIUM 9.5   Liver Function Tests: Recent Labs  Lab 10/21/18 0601  AST 17  ALT 14  ALKPHOS 83  BILITOT 0.5  PROT 8.1  ALBUMIN 4.3   No results for input(s): LIPASE, AMYLASE in the last 168 hours.  CBC: Recent Labs  Lab 10/21/18 0601  WBC 10.1  NEUTROABS 6.0  HGB 12.7  HCT 37.9  MCV 84.6  PLT 247   Cardiac Enzymes: Recent Labs  Lab 10/21/18 0601  TROPONINI <0.03   BNP: Invalid input(s): POCBNP CBG: No results for input(s): GLUCAP in the last 168 hours.  Radiological  Exams: Mr Jeri Cos And Wo Contrast  Result Date: 10/21/2018 CLINICAL DATA:  Abnormal vision beginning at 4:30 a.m. Hypertension. Headache. EXAM: MRI HEAD WITHOUT AND WITH CONTRAST TECHNIQUE: Multiplanar, multiecho pulse sequences of the brain and surrounding structures were obtained without and with intravenous contrast. CONTRAST:  8 mL Gadavist COMPARISON:  CT head without contrast 11/23/2013 FINDINGS: Brain: No acute infarct, hemorrhage, or mass lesion is present. No significant white matter lesions are present. The ventricles are of normal size. No significant extraaxial fluid collection is present. The internal auditory canals are within normal limits. The brainstem and cerebellum are within normal limits. The postcontrast images demonstrate no pathologic enhancement. Vascular: Flow is present in the major intracranial arteries. Skull and upper cervical spine: The craniocervical junction is normal. Upper cervical spine is  within normal limits. Marrow signal is unremarkable. Sinuses/Orbits: Circumferential mucosal thickening is present in the left maxillary sinus. A fluid level is present. A left mastoid effusion is present. No obstructing nasopharyngeal lesion is present. The paranasal sinuses and mastoid air cells are otherwise clear. The globes and orbits are within normal limits. Other: Normal MRI appearance of the brain IMPRESSION: 1. Normal MRI appearance of the brain. 2. Chronic left maxillary sinus disease. Electronically Signed   By: San Morelle M.D.   On: 10/21/2018 07:19    EKG: No ST elevation or depression   Thank you for allowing me to participate in the care of your patient.  Note: This dictation was prepared with Dragon dictation along with smaller phrase technology. Any transcriptional errors that result from this process are unintentional.  Time spent: 50 minutes D/w Dr End    Bettey Costa, MD

## 2018-10-21 NOTE — ED Provider Notes (Signed)
[facility] Emergency Department Provider Note ____________   First MD Initiated Contact with Patient 10/21/18 [phone]     (approximate)  I have reviewed the triage vital signs and the nursing notes.   HISTORY  Chief Complaint Visual Field Change   HPI Courtney Lopez is a 58 y.o. female with below list of previous medical conditions including right retinal hemorrhage, thoracic aortic aneurysm presents to the emergency department with acute onset at 4:30 AM of left eye blurred vision.  Patient states that for the past few days she has noticed market fluctuations in her blood pressure ranging from markedly high to low.  Patient states that secondary to dizziness on awakening tonight she decided to not take her Coreg schedule.  Patient states while at work tonight abrupt onset blurred vision with a "slight headache at present.  Patient noted to be markedly hypertensive on arrival to the emergency department with a blood pressure of 244/121.  Patient denies any chest pain no shortness of breath.        Past Medical History:  Diagnosis Date   Aortic stenosis    Aortic valve disorders    Breast screening, unspecified    Cervicalgia    Diabetes mellitus without complication (HCC)    Heart murmur    Metatarsal fracture 2007   5th, from wearing high heels   Other symptoms involving abdomen and pelvis(789.9)    Retinal hemorrhage    Symptomatic menopausal or female climacteric states    Unspecified essential hypertension     Patient Active Problem List   Diagnosis Date Noted   Hypertensive emergency    Thoracic aortic aneurysm without rupture (HCC)    Chest pain 03/10/2018   Paroxysmal tachycardia (HCC) 08/12/2017   Thyroid nodule 05/15/2017   Ascending aorta dilatation (HCC) 05/27/2016   Bicuspid aortic valve 05/27/2016   Encounter for preventive health examination 10/29/2015   Diverticulitis 01/24/2015   Adjustment disorder  with anxious mood 06/13/2014   Diabetes mellitus type 2 in obese (HCC) 03/25/2012   Aortic stenosis    Screening for colon cancer 08/25/2011   Screening for breast cancer 08/25/2011   Overweight 08/25/2011   Hyperlipidemia with target LDL less than 100 08/25/2011   Cervical spine disease 06/18/2011   Hypertension 06/18/2011    Past Surgical History:  Procedure Laterality Date   ABDOMINAL HYSTERECTOMY     APPENDECTOMY     done during oophorectomy   CESAREAN SECTION     x 2   gyn surgery     hysterectomy- done for adenomysosis and endometriosis   OOPHORECTOMY     unilateral, due to ruptured ovarian cysts   OSTEOTOMY MAXILLARY     due to bite problems   TUBAL LIGATION      Prior to Admission medications   Medication Sig Start Date End Date Taking? Authorizing Provider  ALPRAZolam Prudy Feeler) 0.25 MG tablet TAKE 1 TABLET BY MOUTH TWICE DAILY AS NEEDED 06/20/18  Yes Sherlene Shams, MD  aspirin 81 MG tablet Take 81 mg by mouth daily.   Yes [provider]  carvedilol (COREG) 25 MG tablet Take 1 tablet (25 mg total) by mouth 2 (two) times daily with a meal. 06/11/18  Yes Sherlene Shams, MD  cholecalciferol (VITAMIN D3) 25 MCG (1000 UT) tablet Take 2,000 Units by mouth daily.   Yes [provider]  estradiol (ESTRACE) 0.1 MG/GM vaginal cream INSERT 1/4 APPLICATORFUL VAGINALLY TWO TIMES A WEEK AS DIRECTED 09/17/18  Yes Tullo,  Mar Daringeresa L, MD  fluticasone (FLONASE) 50 MCG/ACT nasal spray PLACE 2 SPRAYS INTO BOTH NOSTRILS DAILY. 03/19/18  Yes [name], Teresa L, MD  hydrochlorothiazide (HYDRODIURIL) 25 MG tablet Take 1 tablet (25 mg total) by mouth daily. Patient taking differently: Take 12.5 mg by mouth daily.  06/11/18  Yes [name], Teresa L, MD  HYDROcodone-acetaminophen (NORCO) 10-325 MG tablet Take 1 tablet by mouth every 6 (six) hours as needed. 05/15/18  Yes [name], Teresa L, MD  losartan (COZAAR) 100 MG tablet TAKE 1 TABLET BY MOUTH DAILY. 01/27/18  Yes Gollan, Tollie Pizzaimothy  J, MD  Menthol, Topical Analgesic, (BIOFREEZE EX) Apply topically.   Yes [provider]  rosuvastatin (CRESTOR) 5 MG tablet Take 5 mg by mouth at bedtime.    Yes [provider]  traMADol (ULTRAM) 50 MG tablet TAKE 2 TABLETS BY MOUTH EVERY 6 HOURS AS NEEDED FOR PAIN 06/20/18  Yes [name], Teresa L, MD  amLODipine (NORVASC) 10 MG tablet Take 1 tablet (10 mg total) by mouth daily. 10/21/18   Adrian SaranMody, Sital, MD    Allergies Prozac [fluoxetine hcl]  Family History  Problem Relation Age of Onset   Diabetes Mother    Aortic stenosis Mother    Cancer Father        leukemia   Retinoblastoma Son    Melanoma Son    Cancer Maternal Aunt        breast   Breast cancer Maternal Aunt [id number]    Social History Social History   Tobacco Use   Smoking status: Never Smoker   Smokeless tobacco: Never Used  Substance Use Topics   Alcohol use: Yes    Comment: occassional   Drug use: No    Review of Systems Constitutional: No fever/chills Eyes: Positive for left eye blurred vision. ENT: No sore throat. Cardiovascular: Denies chest pain. Respiratory: Denies shortness of breath. Gastrointestinal: No abdominal pain.  No nausea, no vomiting.  No diarrhea.  No constipation. Genitourinary: Negative for dysuria. Musculoskeletal: Negative for neck pain.  Negative for back pain. Integumentary: Negative for rash. Neurological: Negative for headaches, focal weakness or numbness.  ____________________________________________   PHYSICAL EXAM:  VITAL SIGNS: ED Triage Vitals  Enc Vitals Group     BP 10/21/18 0557 (!) 244/121     Pulse Rate 10/21/18 0557 88     Resp 10/21/18 0557 18     Temp 10/21/18 0557 97.9 F (36.6 C)     Temp Source 10/21/18 0557 Oral     SpO2 10/21/18 0557 99 %     Weight 10/21/18 0558 80.7 kg (178 lb)     Height 10/21/18 0558 1.676 m (5\' 6" )     Head Circumference --      Peak Flow --      Pain Score 10/21/18 0558 2     Pain Loc --      Pain Edu?  --      Excl. in GC? --     Constitutional: Alert and oriented. Well appearing and in no acute distress. Eyes: Conjunctivae are normal. PERRL. EOMI. Mouth/Throat: Mucous membranes are moist.  Oropharynx non-erythematous. Neck: No stridor.   Cardiovascular: Normal rate, regular rhythm. Good peripheral circulation. Grossly normal heart sounds. Respiratory: Normal respiratory effort.  No retractions. No audible wheezing. Gastrointestinal: Soft and nontender. No distention.  Musculoskeletal: No lower extremity tenderness nor edema. No gross deformities of extremities. Neurologic:  Normal speech and language. No gross focal neurologic deficits are appreciated.  Skin:  Skin is warm, dry and  intact. No rash noted. Psychiatric: Mood and affect are normal. Speech and behavior are normal.  ____________________________________________   LABS (all labs ordered are listed, but only abnormal results are displayed)  Labs Reviewed  COMPREHENSIVE METABOLIC PANEL - Abnormal; Notable for the following components:      Result Value   Glucose, Bld 201 (*)    BUN 31 (*)    All other components within normal limits  PROTIME-INR  APTT  CBC  TROPONIN I  DIFFERENTIAL   _____  RADIOLOGY I, Reading Dewayne Shorter, personally viewed and evaluated these images (plain radiographs) as part of my medical decision making, as well as reviewing the written report by the radiologist.  ED MD interpretation: MRI interpreted as normal per radiologist.  Official radiology report(s): Mr Brain W And Wo Contrast  Result Date: 10/21/2018 CLINICAL DATA:  Abnormal vision beginning at 4:30 a.m. Hypertension. Headache. EXAM: MRI HEAD WITHOUT AND WITH CONTRAST TECHNIQUE: Multiplanar, multiecho pulse sequences of the brain and surrounding structures were obtained without and with intravenous contrast. CONTRAST:  8 mL Gadavist COMPARISON:  CT head without contrast 11/23/2013 FINDINGS: Brain: No acute infarct, hemorrhage, or mass  lesion is present. No significant white matter lesions are present. The ventricles are of normal size. No significant extraaxial fluid collection is present. The internal auditory canals are within normal limits. The brainstem and cerebellum are within normal limits. The postcontrast images demonstrate no pathologic enhancement. Vascular: Flow is present in the major intracranial arteries. Skull and upper cervical spine: The craniocervical junction is normal. Upper cervical spine is within normal limits. Marrow signal is unremarkable. Sinuses/Orbits: Circumferential mucosal thickening is present in the left maxillary sinus. A fluid level is present. A left mastoid effusion is present. No obstructing nasopharyngeal lesion is present. The paranasal sinuses and mastoid air cells are otherwise clear. The globes and orbits are within normal limits. Other: Normal MRI appearance of the brain IMPRESSION: 1. Normal MRI appearance of the brain. 2. Chronic left maxillary sinus disease. Electronically Signed   By: Marin Roberts M.D.   On: 10/21/2018 07:19   Ct Angio Chest/abd/pel For Dissection W And/or Wo Contrast  Result Date: 10/21/2018 CLINICAL DATA:  Known TAA, vision change, headache, back pain EXAM: CT ANGIOGRAPHY CHEST, ABDOMEN AND PELVIS TECHNIQUE: Multidetector CT imaging through the chest, abdomen and pelvis was performed using the standard protocol during bolus administration of intravenous contrast. Multiplanar reconstructed images and MIPs were obtained and reviewed to evaluate the vascular anatomy. CONTRAST:  ISOVUE-370 IOPAMIDOL (ISOVUE-370) INJECTION 76% COMPARISON:  05/04/2018 FINDINGS: CTA CHEST FINDINGS Cardiovascular: Preferential opacification of the thoracic aorta. Unchanged aneurysmal dilatation of the tubular ascending thoracic aorta, measuring 4.9 x 4.8 cm. The aortic valve, sinuses of Valsalva, distal aortic arch, and descending thoracic aorta are normal in caliber. There is no  significant atherosclerosis. No evidence of aortic dissection, ulceration, or other acute aortic pathology. Normal heart size. There is calcification and leaflet thickening of the aortic valve. Scattered coronary artery calcifications. No pericardial effusion. Mediastinum/Nodes: No enlarged mediastinal, hilar, or axillary lymph nodes. Right lobe thyroid nodule, unchanged from prior and previously characterized by ultrasound. Trachea, and esophagus demonstrate no significant findings. Lungs/Pleura: There are numerous tiny, less than 2 mm biapical centrilobular pulmonary nodules (e.g. Series 7, image 19), nonspecific and infectious or inflammatory, unchanged from prior examination. No pleural effusion or pneumothorax. Musculoskeletal: No chest wall abnormality. No acute or significant osseous findings. Review of the MIP images confirms the above findings. CTA ABDOMEN AND PELVIS FINDINGS VASCULAR  Normal contour and caliber of the abdominal aorta. No evidence of aneurysm, dissection, ulceration, or other acute pathology. No significant aortic atherosclerosis. Incidental note of variant celiac axis anatomy with a replaced common hepatic artery arising from the aorta. There are solitary bilateral renal arteries with high-grade, approximately 70% stenosis of the proximal left renal artery and apparent atherosclerosis without significant stenosis of the origin of the right renal artery. Review of the MIP images confirms the above findings. NON-VASCULAR Hepatobiliary: No solid liver abnormality is seen. No gallstones, gallbladder wall thickening, or biliary dilatation. Pancreas: Unremarkable. No pancreatic ductal dilatation or surrounding inflammatory changes. Spleen: Normal in size without significant abnormality. Adrenals/Urinary Tract: Adrenal glands are unremarkable. Large exophytic inferior pole cyst of the right kidney kidneys are otherwise normal, without renal calculi, solid lesion, or hydronephrosis. Bladder is  unremarkable. Stomach/Bowel: Stomach is within normal limits. Appendix is surgically absent. No evidence of bowel wall thickening, distention, or inflammatory changes. Large burden of stool in the colon and rectum. Reproductive: Status post hysterectomy. Other: No enlarged abdominal or pelvic lymph nodes. No abdominal wall hernia or abnormality. No abdominopelvic ascites. Musculoskeletal: No acute or significant osseous findings. IMPRESSION: 1. Unchanged aneurysmal dilatation of the tubular ascending thoracic aorta, measuring 4.9 x 4.8 cm. The aortic valve, sinuses of Valsalva, distal aortic arch, and descending thoracic aorta are normal in caliber. There is no significant atherosclerosis of the aorta. No evidence of aortic dissection, ulceration, or other acute aortic pathology. 2. Calcification and leaflet thickening of the aortic valve, not optimally characterized by non tailored, non cardiac motion gated CT. This is in keeping with charted history of aortic stenosis. 3. There are solitary bilateral renal arteries with high-grade, approximately 70% stenosis of the proximal left renal artery (series 5, image 112) and apparent atherosclerosis without significant stenosis of the origin of the right renal artery. Hemodynamic significance of renal artery stenosis may be evaluated by Doppler ultrasound on a nonemergent basis if clinically appropriate. These findings are somewhat unusual given the general lack of atherosclerosis observed elsewhere in the abdominal aorta and branch vasculature, and may indicate non atherosclerotic etiology such as fibromuscular dysplasia. 4. Other chronic, incidental, and postoperative findings as detailed above. Electronically Signed   By: Eddie Candle M.D.   On: 10/21/2018 12:00    ____________________________________________   Procedures   ____________________________________________   INITIAL IMPRESSION / MDM / [place] / ED COURSE  As part of my medical  decision making, I reviewed the following data within the [place]   20-year-old female presented with above-stated history and physical exam secondary to acute onset of left eye blurred vision with concern for possible central retinal vein occlusion less likely central retinal artery occlusion.  Also considered possibility of cerebral involvement.  Patient markedly hypertensive as stated above and as such 10 mg of IV labetalol was given with minimal improvement of blood pressure and as such 1 inch of Nitropaste was applied to the patient with blood pressure improved at this time current pressure 195/108.  MRI pending at this time.  Patient discussed with Dr. Edison Pace ophthalmologist at [facility] who is willing to see the patient in office this morning pending blood pressure improvement normal MRI.  Patient's MRI resulted as normal.  On reevaluation patient's blood pressure improving however still markedly hypertensive.  Patient admits to feelings of confusion and as such concern for the possibility of hypertensive encephalopathy as the patient states that she has had periods of confusion.  Patient  discussed with Dr. Nancy MarusMayo hospitalist for hospital admission for further evaluation and management      *Courtney Lopez was evaluated in Emergency Department on 10/22/2018 for the symptoms described in the history of present illness. She was evaluated in the context of the global COVID-19 pandemic, which necessitated consideration that the patient might be at risk for infection with the SARS-CoV-2 virus that causes COVID-19. Institutional protocols and algorithms that pertain to the evaluation of patients at risk for COVID-19 are in a state of rapid change based on information released by regulatory bodies including the CDC and federal and state organizations. These policies and algorithms were followed during the patient's care in the ED.  Some ED evaluations and interventions may be  delayed as a result of limited staffing during the pandemic.*    ____________________________________________  FINAL CLINICAL IMPRESSION(S) / ED DIAGNOSES  Final diagnoses:  Malignant hypertension  Visual disturbance  Hypertensive encephalopathy     MEDICATIONS GIVEN DURING THIS VISIT:  Medications  gadobutrol (GADAVIST) 1 MMOL/ML injection 8 mL (8 mLs Intravenous Contrast Given 10/21/18 0654)  labetalol (NORMODYNE) injection 10 mg (10 mg Intravenous Given 10/21/18 0613)  nitroGLYCERIN (NITROGLYN) 2 % ointment 1 inch (1 inch Topical Given 10/21/18 0622)  labetalol (NORMODYNE) injection 10 mg (10 mg Intravenous Given 10/21/18 0747)  iopamidol (ISOVUE-370) 76 % injection 100 mL (100 mLs Intravenous Contrast Given 10/21/18 1045)  acetaminophen (TYLENOL) tablet 650 mg (650 mg Oral Given 10/21/18 1221)     ED Discharge Orders         Ordered    amLODipine (NORVASC) 10 MG tablet  Daily     10/21/18 1236    Increase activity slowly     10/21/18 1236    Diet - low sodium heart healthy     10/21/18 1236    Call MD for:  persistant dizziness or light-headedness     10/21/18 1236           Note:  This document was prepared using Dragon voice recognition software and may include unintentional dictation errors.   Darci CurrentBrown, [place], MD 10/22/18 [phone]

## 2018-10-21 NOTE — ED Notes (Signed)
Pt discharged home after verbalizing understanding of discharge instructions; nad noted. 

## 2018-10-21 NOTE — Consult Note (Signed)
Reason for Consult: L eye blurred vision  Referring Physician: Dr. Juliene PinaMody   CC: transient blurred vision    HPI: Courtney Lopez is an 58 y.o. female with history of bicuspid aortic valve and thoracic aneurysm who presented to the emergency room due to elevated blood pressure and blurry vision in the L eye. Patient was working last night and didn't take her BP medications last night due to period of hypotension. At 5 AM she had period of decreased vision on L eye associated with elevated BP, SBP >240.   Past Medical History:  Diagnosis Date  . Aortic stenosis   . Aortic valve disorders   . Breast screening, unspecified   . Cervicalgia   . Diabetes mellitus without complication (HCC)   . Heart murmur   . Metatarsal fracture 2007   5th, from wearing high heels  . Other symptoms involving abdomen and pelvis(789.9)   . Retinal hemorrhage   . Symptomatic menopausal or female climacteric states   . Unspecified essential hypertension     Past Surgical History:  Procedure Laterality Date  . ABDOMINAL HYSTERECTOMY    . APPENDECTOMY     done during oophorectomy  . CESAREAN SECTION     x 2  . gyn surgery     hysterectomy- done for adenomysosis and endometriosis  . OOPHORECTOMY     unilateral, due to ruptured ovarian cysts  . OSTEOTOMY MAXILLARY     due to bite problems  . TUBAL LIGATION      Family History  Problem Relation Age of Onset  . Diabetes Mother   . Aortic stenosis Mother   . Cancer Father        leukemia  . Retinoblastoma Son   . Melanoma Son   . Cancer Maternal Aunt        breast  . Breast cancer Maternal Aunt [id number]    Social History:  reports that she has never smoked. She has never used smokeless tobacco. She reports current alcohol use. She reports that she does not use drugs.  Allergies  Allergen Reactions  . Prozac [Fluoxetine Hcl] Palpitations    Medications: I have reviewed the patient's current medications.  ROS: History obtained from the  patient  General ROS: negative for - chills, fatigue, fever, night sweats, weight gain or weight loss Psychological ROS: negative for - behavioral disorder, hallucinations, memory difficulties, mood swings or suicidal ideation Ophthalmic ROS: negative for - blurry vision, double vision, eye pain or loss of vision ENT ROS: negative for - epistaxis, nasal discharge, oral lesions, sore throat, tinnitus or vertigo Allergy and Immunology ROS: negative for - hives or itchy/watery eyes Hematological and Lymphatic ROS: negative for - bleeding problems, bruising or swollen lymph nodes Endocrine ROS: negative for - galactorrhea, hair pattern changes, polydipsia/polyuria or temperature intolerance Respiratory ROS: negative for - cough, hemoptysis, shortness of breath or wheezing Cardiovascular ROS: negative for - chest pain, dyspnea on exertion, edema or irregular heartbeat Gastrointestinal ROS: negative for - abdominal pain, diarrhea, hematemesis, nausea/vomiting or stool incontinence Genito-Urinary ROS: negative for - dysuria, hematuria, incontinence or urinary frequency/urgency Musculoskeletal ROS: negative for - joint swelling or muscular weakness Neurological ROS: as noted in HPI Dermatological ROS: negative for rash and skin lesion changes  Physical Examination: Blood pressure (!) 181/113, pulse 74, temperature 97.9 F (36.6 C), temperature source Oral, resp. rate 17, height 5\' 6"  (1.676 m), weight 80.7 kg, SpO2 98 %.    Neurological Examination   Mental Status: Alert, oriented, thought content  appropriate.  Speech fluent without evidence of aphasia.  Able to follow 3 step commands without difficulty. Cranial Nerves: II: Discs flat bilaterally; Visual fields grossly normal, pupils equal, round, reactive to light and accommodation III,IV, VI: ptosis not present, extra-ocular motions intact bilaterally V,VII: smile symmetric, facial light touch sensation normal bilaterally VIII: hearing normal  bilaterally IX,X: gag reflex present XI: bilateral shoulder shrug XII: midline tongue extension Motor: Right : Upper extremity   5/5    Left:     Upper extremity   5/5  Lower extremity   5/5     Lower extremity   5/5 Tone and bulk:normal tone throughout; no atrophy noted Sensory: Pinprick and light touch intact throughout, bilaterally Deep Tendon Reflexes: 2+ and symmetric throughout Plantars: Right: downgoing   Left: downgoing Cerebellar: normal finger-to-nose, normal rapid alternating movements and normal heel-to-shin test Gait: not tested     Laboratory Studies:   Basic Metabolic Panel: Recent Labs  Lab 10/21/18 0601  NA 139  K 4.0  CL 99  CO2 30  GLUCOSE 201*  BUN 31*  CREATININE 0.75  CALCIUM 9.5    Liver Function Tests: Recent Labs  Lab 10/21/18 0601  AST 17  ALT 14  ALKPHOS 83  BILITOT 0.5  PROT 8.1  ALBUMIN 4.3   No results for input(s): LIPASE, AMYLASE in the last 168 hours. No results for input(s): AMMONIA in the last 168 hours.  CBC: Recent Labs  Lab 10/21/18 0601  WBC 10.1  NEUTROABS 6.0  HGB 12.7  HCT 37.9  MCV 84.6  PLT 247    Cardiac Enzymes: Recent Labs  Lab 10/21/18 0601  TROPONINI <0.03    BNP: Invalid input(s): POCBNP  CBG: No results for input(s): GLUCAP in the last 168 hours.  Microbiology: No results found for this or any previous visit.  Coagulation Studies: Recent Labs    10/21/18 0601  LABPROT 12.6  INR 1.0    Urinalysis: No results for input(s): COLORURINE, LABSPEC, PHURINE, GLUCOSEU, HGBUR, BILIRUBINUR, KETONESUR, PROTEINUR, UROBILINOGEN, NITRITE, LEUKOCYTESUR in the last 168 hours.  Invalid input(s): APPERANCEUR  Lipid Panel:     Component Value Date/Time   CHOL 126 05/15/2018 0840   CHOL 175 02/17/2013 0746   TRIG 76.0 05/15/2018 0840   TRIG 100 02/17/2013 0746   HDL 53.30 05/15/2018 0840   HDL 53 02/17/2013 0746   CHOLHDL 2 05/15/2018 0840   VLDL 15.2 05/15/2018 0840   VLDL 20 02/17/2013  0746   LDLCALC 57 05/15/2018 0840   LDLCALC 102 (H) 02/17/2013 0746    HgbA1C:  Lab Results  Component Value Date   HGBA1C 6.5 05/15/2018    Urine Drug Screen:  No results found for: LABOPIA, COCAINSCRNUR, LABBENZ, AMPHETMU, THCU, LABBARB  Alcohol Level: No results for input(s): ETH in the last 168 hours.  Other results: EKG: normal EKG, normal sinus rhythm, unchanged from previous tracings.  Imaging: Mr Jeri Cos And Wo Contrast  Result Date: 10/21/2018 CLINICAL DATA:  Abnormal vision beginning at 4:30 a.m. Hypertension. Headache. EXAM: MRI HEAD WITHOUT AND WITH CONTRAST TECHNIQUE: Multiplanar, multiecho pulse sequences of the brain and surrounding structures were obtained without and with intravenous contrast. CONTRAST:  8 mL Gadavist COMPARISON:  CT head without contrast 11/23/2013 FINDINGS: Brain: No acute infarct, hemorrhage, or mass lesion is present. No significant white matter lesions are present. The ventricles are of normal size. No significant extraaxial fluid collection is present. The internal auditory canals are within normal limits. The brainstem and cerebellum are within  normal limits. The postcontrast images demonstrate no pathologic enhancement. Vascular: Flow is present in the major intracranial arteries. Skull and upper cervical spine: The craniocervical junction is normal. Upper cervical spine is within normal limits. Marrow signal is unremarkable. Sinuses/Orbits: Circumferential mucosal thickening is present in the left maxillary sinus. A fluid level is present. A left mastoid effusion is present. No obstructing nasopharyngeal lesion is present. The paranasal sinuses and mastoid air cells are otherwise clear. The globes and orbits are within normal limits. Other: Normal MRI appearance of the brain IMPRESSION: 1. Normal MRI appearance of the brain. 2. Chronic left maxillary sinus disease. Electronically Signed   By: Marin Robertshristopher  Mattern M.D.   On: 10/21/2018 07:19      Assessment/Plan:   58 y.o. female with history of bicuspid aortic valve and thoracic aneurysm who presented to the emergency room due to elevated blood pressure and blurry vision in the L eye. Patient was working last night and didn't take her BP medications last night due to period of hypotension. At 5 AM she had period of decreased vision on L eye associated with elevated BP, SBP >240.   - Transient vision disturbance in the L eye that has improved. Associated with elevated BP.  - MRI brain no acute abnormalities - Symptoms likely BP related - No further imaging from neuro stand point  10/21/2018, 10:59 AM

## 2018-10-21 NOTE — Consult Note (Signed)
Cardiology Consultation:   Patient ID: Courtney Lopez MRN: [id number]; DOB: 01/22/1961  Admit date: 10/21/2018 Date of Consult: 10/21/2018  Primary Care Provider: [name], Teresa L, MD Primary Cardiologist: Dossie Arbourim Gollan, MD PhD Primary Electrophysiologist:  None    Patient Profile:   Courtney Lopez is a 58 y.o. female with a hx of bicuspid aortic valve with mild stenosis and regurgitation, thoracic aortic aneurysm (up to 4.8 cm at the a sending aorta), right retinal hemorrhage, and labile hypertension, who is being seen today for the evaluation of elevated blood pressure and vision changes at the request of Dr. Juliene PinaMody.  History of Present Illness:   Courtney Lopez was in her usual state of health.  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  After awakening in the mid afternoon, she checked her blood pressure and found it to be low at 87/46.  She was asymptomatic at the time and decided to forego taking her evening carvedilol and losartan.  At work, she was busy but feeling fine until around 4 AM when she developed blurriness in the left eye while working on a computer.  She also had a mild headache, which she experiences intermittently and attributes to chronic neck problems.  She presented to the emergency department for further evaluation was found to be severely hypertensive with a blood pressure of 244/121.  She was given carvedilol 25 mg, HCTZ 12.5 mg, and 1 inch of nitroglycerin paste with some improvement in her blood pressure.  MRI of the brain did not show any acute abnormalities.  At this time, Courtney Lopez still notes some blurriness in the left eye.  She denies other acute focal neurologic changes, though she has been experiencing occasional word finding difficulties for the last few months.  She has some upper back achiness, which she attributes to lying on a gurney in the ED for the last several  hours.  She denies chest pain, palpitations, lightheadedness, shortness of breath, and orthopnea.  He has occasional dependent edema, which is controlled with compression stockings.  She feels like her blood pressure has been fluctuating quite a bit over the last several months.  She was transitioned from labetalol to carvedilol in the winter by Dr. Darrick Huntsmanullo.  She was also taken off HCTZ due to slight elevation in BUN and creatinine, though this was subsequently restarted due to suboptimal blood pressure control.  Past Medical History:  Diagnosis Date   Aortic stenosis    Aortic valve disorders    Breast screening, unspecified    Cervicalgia    Diabetes mellitus without complication (HCC)    Heart murmur    Metatarsal fracture 2007   5th, from wearing high heels   Other symptoms involving abdomen and pelvis(789.9)    Retinal hemorrhage    Symptomatic menopausal or female climacteric states    Unspecified essential hypertension     Past Surgical History:  Procedure Laterality Date   ABDOMINAL HYSTERECTOMY     APPENDECTOMY     done during oophorectomy   CESAREAN SECTION     x 2   gyn surgery     hysterectomy- done for adenomysosis and endometriosis   OOPHORECTOMY     unilateral, due to ruptured ovarian cysts   OSTEOTOMY MAXILLARY     due to bite problems   TUBAL LIGATION       Home Medications:  Prior to Admission medications   Medication Sig Start Date Wrigley Plasencia  Date Taking? Authorizing Provider  ALPRAZolam Duanne Moron) 0.25 MG tablet TAKE 1 TABLET BY MOUTH TWICE DAILY AS NEEDED 06/20/18  Yes Crecencio Mc, MD  aspirin 81 MG tablet Take 81 mg by mouth daily.   Yes [provider]  carvedilol (COREG) 25 MG tablet Take 1 tablet (25 mg total) by mouth 2 (two) times daily with a meal. 06/11/18  Yes Crecencio Mc, MD  cholecalciferol (VITAMIN D3) 25 MCG (1000 UT) tablet Take 2,000 Units by mouth daily.   Yes [provider]  estradiol (ESTRACE) 0.1 MG/GM  vaginal cream INSERT 1/4 APPLICATORFUL VAGINALLY TWO TIMES A WEEK AS DIRECTED 09/17/18  Yes Crecencio Mc, MD  fluticasone (FLONASE) 50 MCG/ACT nasal spray PLACE 2 SPRAYS INTO BOTH NOSTRILS DAILY. 03/19/18  Yes Crecencio Mc, MD  hydrochlorothiazide (HYDRODIURIL) 25 MG tablet Take 1 tablet (25 mg total) by mouth daily. Patient taking differently: Take 12.5 mg by mouth daily.  06/11/18  Yes Crecencio Mc, MD  HYDROcodone-acetaminophen (NORCO) 10-325 MG tablet Take 1 tablet by mouth every 6 (six) hours as needed. 05/15/18  Yes Crecencio Mc, MD  losartan (COZAAR) 100 MG tablet TAKE 1 TABLET BY MOUTH DAILY. 01/27/18  Yes Gollan, Kathlene November, MD  Menthol, Topical Analgesic, (BIOFREEZE EX) Apply topically.   Yes [provider]  rosuvastatin (CRESTOR) 5 MG tablet Take 5 mg by mouth at bedtime.    Yes [provider]  traMADol (ULTRAM) 50 MG tablet TAKE 2 TABLETS BY MOUTH EVERY 6 HOURS AS NEEDED FOR PAIN 06/20/18  Yes Crecencio Mc, MD    Inpatient Medications: Scheduled Meds:  carvedilol  25 mg Oral BID WC   hydrochlorothiazide  12.5 mg Oral Daily   Continuous Infusions:  PRN Meds:   Allergies:    Allergies  Allergen Reactions   Prozac [Fluoxetine Hcl] Palpitations    Social History:   Social History   Tobacco Use   Smoking status: Never Smoker   Smokeless tobacco: Never Used  Substance Use Topics   Alcohol use: Yes    Comment: occassional   Drug use: No     Family History:   Family History  Problem Relation Age of Onset   Diabetes Mother    Aortic stenosis Mother    Cancer Father        leukemia   Retinoblastoma Son    Melanoma Son    Cancer Maternal Aunt        breast   Breast cancer Maternal Aunt 70     ROS:  Please see the history of present illness. All other ROS reviewed and negative.     Physical Exam/Data:   Vitals:   10/21/18 0823 10/21/18 0830 10/21/18 0900 10/21/18 1003  BP: (!) 179/110 (!) 184/125 (!) 153/104 (!)  181/113  Pulse:    74  Resp:  (!) 21 14 17   Temp:      TempSrc:      SpO2:    98%  Weight:      Height:       No intake or output data in the 24 hours ending 10/21/18 1108 Last 3 Weights 10/21/2018 05/15/2018 03/10/2018  Weight (lbs) 178 lb 180 lb 6.4 oz 178 lb  Weight (kg) 80.74 kg 81.829 kg 80.74 kg     Body mass index is 28.73 kg/m.  General:  Well nourished, well developed, in no acute distress. HEENT: normal Lymph: no adenopathy Neck: no JVD Endocrine:  No thryomegaly Vascular: No carotid bruits;  FA pulses 2+ bilaterally without bruits  Cardiac: Regular rate and rhythm with 2/6 systolic murmur radiating to the carotids.  No rubs or gallops. Lungs:  clear to auscultation bilaterally, no wheezing, rhonchi or rales  Abd: soft, nontender, no hepatomegaly  Ext: Trace pretibial edema with compression stockings in place. Musculoskeletal:  No deformities, BUE and BLE strength normal and equal Skin: warm and dry  Neuro: Cranial nerves III through XII grossly intact.  No focal weakness. Psych:  Normal affect   EKG: Not available for review. Telemetry: Sinus rhythm with occasional PVCs.  Relevant CV Studies: Echocardiogram (01/08/2017): Normal LV size with moderate LVH.  LVEF 60-65% with grade 1 diastolic dysfunction and elevated filling pressures.  Bicuspid aortic valve with moderate thickening and mild calcification.  Mild stenosis and regurgitation (mean gradient 11 mmHg).  Normal RV size and function.  Laboratory Data:  Chemistry Recent Labs  Lab 10/21/18 0601  NA 139  K 4.0  CL 99  CO2 30  GLUCOSE 201*  BUN 31*  CREATININE 0.75  CALCIUM 9.5  GFRNONAA >60  GFRAA >60  ANIONGAP 10    Recent Labs  Lab 10/21/18 0601  PROT 8.1  ALBUMIN 4.3  AST 17  ALT 14  ALKPHOS 83  BILITOT 0.5   Hematology Recent Labs  Lab 10/21/18 0601  WBC 10.1  RBC 4.48  HGB 12.7  HCT 37.9  MCV 84.6  MCH 28.3  MCHC 33.5  RDW 12.0  PLT 247   Cardiac Enzymes Recent Labs  Lab  10/21/18 0601  TROPONINI <0.03   No results for input(s): TROPIPOC in the last 168 hours.  BNPNo results for input(s): BNP, PROBNP in the last 168 hours.  DDimer No results for input(s): DDIMER in the last 168 hours.  Radiology/Studies:  Mr Laqueta JeanBrain W And Wo Contrast  Result Date: 10/21/2018 CLINICAL DATA:  Abnormal vision beginning at 4:30 a.m. Hypertension. Headache. EXAM: MRI HEAD WITHOUT AND WITH CONTRAST TECHNIQUE: Multiplanar, multiecho pulse sequences of the brain and surrounding structures were obtained without and with intravenous contrast. CONTRAST:  8 mL Gadavist COMPARISON:  CT head without contrast 11/23/2013 FINDINGS: Brain: No acute infarct, hemorrhage, or mass lesion is present. No significant white matter lesions are present. The ventricles are of normal size. No significant extraaxial fluid collection is present. The internal auditory canals are within normal limits. The brainstem and cerebellum are within normal limits. The postcontrast images demonstrate no pathologic enhancement. Vascular: Flow is present in the major intracranial arteries. Skull and upper cervical spine: The craniocervical junction is normal. Upper cervical spine is within normal limits. Marrow signal is unremarkable. Sinuses/Orbits: Circumferential mucosal thickening is present in the left maxillary sinus. A fluid level is present. A left mastoid effusion is present. No obstructing nasopharyngeal lesion is present. The paranasal sinuses and mastoid air cells are otherwise clear. The globes and orbits are within normal limits. Other: Normal MRI appearance of the brain IMPRESSION: 1. Normal MRI appearance of the brain. 2. Chronic left maxillary sinus disease. Electronically Signed   By: Marin Robertshristopher  Mattern M.D.   On: 10/21/2018 07:19    Assessment and Plan:   Hypertensive emergency: Patient noted to have severely elevated blood pressure with acute neurologic changes (left eye visual disturbance).  Brain MRI did not  show any acute abnormality.  She was seen by neurology and no further testing was recommended.  Blood pressure has improved, though it is still suboptimally controlled.  Given history of thoracic aortic aneurysm and severe blood pressure elevation,  aortic dissection needs to be excluded in the setting of back pain (though most likely musculoskeletal in nature) and acute neurologic changes.  I have recommended CTA of the chest, abdomen, and pelvis to reassess her aorta as well as to exclude renal artery stenosis.  If CTA is stable, I recommend continuing current doses of HCTZ, carvedilol, and losartan, as well as adding amlodipine 2.5 mg daily.  Consider urgent ophthalmologic evaluation for further assessment of left eye visual disturbance.  Bicuspid aortic valve and thoracic aortic aneurysm: As above, we will reimage Ms. Strong aorta.  If no evidence of acute aortic syndrome, I recommend nonemergent echocardiogram to reassess aortic valve (last echo in 12/2016).  We will need to optimize blood pressure control, as outlined above.  Avoid fluoroquinolones.  Disposition: If blood pressure control continues to improve, left eye vision changes are stable, and CTA of the chest, abdomen, and pelvis does not reveal any acute abnormality, I think it would be reasonable for Ms. Bosserman to be discharged from the ED and to follow-up with Dr. Mariah MillingGollan in the office in 1 to 2 weeks to reassess her blood pressure.  For questions or updates, please contact [facility] Please consult [email or web address] for contact info under [facility].  Signed, Yvonne Kendallhristopher Avarie Tavano, MD  10/21/2018 11:08 AM

## 2018-10-21 NOTE — Progress Notes (Signed)
CT reviewed and d/w dr End.  No change in aneurysm RAS likely chronic in nature  Will f/u dr Rockey Situ and [facility]

## 2018-10-21 NOTE — ED Notes (Signed)
Pt reports 4/10 pain to head, neck and upper back. Requested tylenol. Dr Cherylann Banas notified and gave verbal orders for 650mg  Tylenol.

## 2018-10-22 ENCOUNTER — Telehealth: Payer: 59 | Admitting: Nurse Practitioner

## 2018-10-22 ENCOUNTER — Telehealth: Payer: Self-pay | Admitting: Nurse Practitioner

## 2018-10-22 NOTE — Telephone Encounter (Signed)

## 2018-10-25 ENCOUNTER — Telehealth: Payer: 59 | Admitting: Family

## 2018-10-25 DIAGNOSIS — J019 Acute sinusitis, unspecified: Secondary | ICD-10-CM | POA: Diagnosis not present

## 2018-10-25 MED ORDER — AMOXICILLIN-POT CLAVULANATE 875-125 MG PO TABS: 1.0000 | ORAL_TABLET | Freq: Two times a day (BID) | ORAL | 0 refills | Status: DC

## 2018-10-25 NOTE — Progress Notes (Signed)
We are sorry that you are not feeling well.  Here is how we plan to help!  Based on what you have shared with me it looks like you have sinusitis.  Sinusitis is inflammation and infection in the sinus cavities of the head.  Based on your presentation I believe you most likely have Acute Bacterial Sinusitis.  This is an infection caused by bacteria and is treated with antibiotics. I have prescribed Augmentin 875mg/125mg one tablet twice daily with food, for 7 days. You may use an oral decongestant such as Mucinex D or if you have glaucoma or high blood pressure use plain Mucinex. Saline nasal spray help and can safely be used as often as needed for congestion.  If you develop worsening sinus pain, fever or notice severe headache and vision changes, or if symptoms are not better after completion of antibiotic, please schedule an appointment with a health care provider.    Approximately 5 minutes was spent documenting and reviewing patient's chart.   Sinus infections are not as easily transmitted as other respiratory infection, however we still recommend that you avoid close contact with loved ones, especially the very young and elderly.  Remember to wash your hands thoroughly throughout the day as this is the number one way to prevent the spread of infection!  Home Care:  Only take medications as instructed by your medical team.  Complete the entire course of an antibiotic.  Do not take these medications with alcohol.  A steam or ultrasonic humidifier can help congestion.  You can place a towel over your head and breathe in the steam from hot water coming from a faucet.  Avoid close contacts especially the very young and the elderly.  Cover your mouth when you cough or sneeze.  Always remember to wash your hands.  Get Help Right Away If:  You develop worsening fever or sinus pain.  You develop a severe head ache or visual changes.  Your symptoms persist after you have completed your  treatment plan.  Make sure you  Understand these instructions.  Will watch your condition.  Will get help right away if you are not doing well or get worse.  Your e-visit answers were reviewed by a board certified advanced clinical practitioner to complete your personal care plan.  Depending on the condition, your plan could have included both over the counter or prescription medications.  If there is a problem please reply  once you have received a response from your provider.  Your safety is important to us.  If you have drug allergies check your prescription carefully.    You can use MyChart to ask questions about today's visit, request a non-urgent call back, or ask for a work or school excuse for 24 hours related to this e-Visit. If it has been greater than 24 hours you will need to follow up with your provider, or enter a new e-Visit to address those concerns.  You will get an e-mail in the next two days asking about your experience.  I hope that your e-visit has been valuable and will speed your recovery. Thank you for using e-visits.    

## 2018-10-26 NOTE — Progress Notes (Signed)
Cardiology Office Note  Date:  10/27/2018   ID:  Courtney Lopez, DOB 12-06-1960, MRN [id number]  PCP:  Courtney Mc, MD   Chief Complaint  Patient presents with  . Follow up    FU from ED    HPI:  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   asymptomatic at the time and decided to forego taking her evening carvedilol and losartan.  At work, she was busy but feeling fine until around 4 AM when she developed blurriness in the left eye while working on a computer.  She also had a mild headache, which she experiences intermittently and attributes to chronic neck problems.  She presented to the emergency department for further evaluation was found to be severely hypertensive with a blood pressure of 244/121.  She was given carvedilol 25 mg, HCTZ 12.5 mg, and 1 inch of nitroglycerin paste with some improvement in her blood pressure.    MRI of the brain did not show any acute abnormalities. Chronic left maxillary sinus disease.  She had CT scan chest in the hospital 1. Unchanged aneurysmal dilatation of the tubular ascending thoracic aorta, measuring 4.9 x 4.8 cm. The aortic valve, sinuses of Valsalva, distal aortic arch, and descending thoracic aorta are normal in caliber. There is no significant atherosclerosis of the aorta.   There are solitary bilateral renal arteries with high-grade, approximately 70% stenosis of the proximal left renal artery (series 5, image 112) and apparent  atherosclerosis without significant stenosis of the origin of the right renal artery.  may indicate non atherosclerotic etiology such as fibromuscular dysplasia.  Other past medical history reviewed Previously with paroxysmal tachycardia captured on her phone using pulse meter  shortness of breath better with exercising more, losing weight Periodic symptoms of orthostasis  Previous studies reviewed looking at aortic valve/bicuspid and dilated aorta Echo 12/2016 Dilated aorta mild 4.2 cm. Stable Aortic valve bicuspid also looks stable, mean gradient 11 mmHg Normal cardiac function  Echo December 2017 Dilated a sending aorta 4.6 cm  CT scan December 2018 Ascending aorta 4.7 cm  CT scan January 2018 ascending aorta 4.6 cm  CT scan December 2017 Aorta 4.6 cm  EKG personally reviewed by myself on todays visit Shows normal sinus rhythm rate 66 bpm left axis deviation, no other significant ST or T wave changes  Other past medical history reviewed Previous CT scan chest 2017 and early 2017 with dilated ascending aorta  4.6 cm Very low CT coronary calcium score    diagnosis of murmur and aortic valve disease was made as she was pregnant with her third child. Murmur persisted even after delivery. She has been seen by Dr. Clayborn Bigness for many years with periodic echocardiogram. She has never been told it is bicuspid or tricuspid valve.    never smoked  family history of SVT requiring ablation, father died at age 50 from cancer, mother with aortic valve stenosis but was in her 58s   PMH:   has a past medical history of Aortic  stenosis, Aortic valve disorders, Breast screening, unspecified, Cervicalgia, Diabetes mellitus without complication (HCC), Heart murmur, Metatarsal fracture (2007), Other symptoms involving abdomen and pelvis(789.9), Retinal hemorrhage, Symptomatic menopausal or female climacteric states, and Unspecified essential hypertension.  PSH:    Past Surgical History:   Procedure Laterality Date  . ABDOMINAL HYSTERECTOMY    . APPENDECTOMY     done during oophorectomy  . CESAREAN SECTION     x 2  . gyn surgery     hysterectomy- done for adenomysosis and endometriosis  . OOPHORECTOMY     unilateral, due to ruptured ovarian cysts  . OSTEOTOMY MAXILLARY     due to bite problems  . TUBAL LIGATION      Current Outpatient Medications  Medication Sig Dispense Refill  . ALPRAZolam (XANAX) 0.25 MG tablet TAKE 1 TABLET BY MOUTH TWICE DAILY AS NEEDED 60 tablet 4  . amLODipine (NORVASC) 10 MG tablet Take 1 tablet (10 mg total) by mouth daily. 30 tablet 0  . amoxicillin-clavulanate (AUGMENTIN) 875-125 MG tablet Take 1 tablet by mouth 2 (two) times daily. 14 tablet 0  . aspirin 81 MG tablet Take 81 mg by mouth daily.    . carvedilol (COREG) 25 MG tablet Take 1 tablet (25 mg total) by mouth 2 (two) times daily with a meal. 180 tablet 1  . cholecalciferol (VITAMIN D3) 25 MCG (1000 UT) tablet Take 2,000 Units by mouth daily.    [name]. estradiol (ESTRACE) 0.1 MG/GM vaginal cream INSERT 1/4 APPLICATORFUL VAGINALLY TWO TIMES A WEEK AS DIRECTED 42.5 g 5  . fluticasone (FLONASE) 50 MCG/ACT nasal spray PLACE 2 SPRAYS INTO BOTH NOSTRILS DAILY. 16 g 6  . hydrochlorothiazide (HYDRODIURIL) 25 MG tablet Take 1 tablet (25 mg total) by mouth daily. (Patient taking differently: Take 12.5 mg by mouth daily. ) 90 tablet 3  . HYDROcodone-acetaminophen (NORCO) 10-325 MG tablet Take 1 tablet by mouth every 6 (six) hours as needed. 120 tablet 0  . losartan (COZAAR) 100 MG tablet TAKE 1 TABLET BY MOUTH DAILY. 90 tablet 4  . Menthol, Topical Analgesic, (BIOFREEZE EX) Apply topically.    . rosuvastatin (CRESTOR) 5 MG tablet Take 5 mg by mouth at bedtime.     . traMADol (ULTRAM) 50 MG tablet TAKE 2 TABLETS BY MOUTH EVERY 6 HOURS AS NEEDED FOR PAIN 240 tablet 5   No current facility-administered medications for this visit.      Allergies:   Prozac [fluoxetine hcl]   Social History:  The  patient  reports that she has never smoked. She has never used smokeless tobacco. She reports current alcohol use. She reports that she does not use drugs.   Family History:   family history includes Aortic stenosis in her mother; Breast cancer (age of onset: 6170) in her maternal aunt; Cancer in her father and maternal aunt; Diabetes in her mother; Melanoma in her son; Retinoblastoma in her son.    Review of Systems: Review of Systems  Constitutional: Negative.   Respiratory: Negative.   Cardiovascular: Negative.   Gastrointestinal: Negative.   Musculoskeletal: Negative.   Neurological: Negative.   Psychiatric/Behavioral: Negative.   All other systems reviewed and are negative.    PHYSICAL EXAM: VS:  BP (!) 133/91 (BP Location: Left Arm, Patient Position: Sitting, Cuff Size: Normal)   Pulse 69   Resp 19   Ht 5\' 6"  (1.676 m)   Wt 180 lb 8 oz (81.9 kg)   LMP  (LMP Unknown)   BMI 29.13 kg/m  , BMI  Body mass index is 29.13 kg/m. Constitutional:  oriented to person, place, and time. No distress.  HENT:  Head: Grossly normal Eyes:  no discharge. No scleral icterus.  Neck: No JVD, no carotid bruits  Cardiovascular: Regular rate and rhythm, 2/6 systolic ejection murmur right sternal border Pulmonary/Chest: Clear to auscultation bilaterally, no wheezes or rails Abdominal: Soft.  no distension.  no tenderness.  Musculoskeletal: Normal range of motion Neurological:  normal muscle tone. Coordination normal. No atrophy Skin: Skin warm and dry Psychiatric: normal affect, pleasant  Recent Labs: 11/17/2017: TSH 1.36 10/21/2018: ALT 14; BUN 31; Creatinine, Ser 0.75; Hemoglobin 12.7; Platelets 247; Potassium 4.0; Sodium 139    Lipid Panel Lab Results  Component Value Date   CHOL 126 05/15/2018   HDL 53.30 05/15/2018   LDLCALC 57 05/15/2018   TRIG 76.0 05/15/2018      Wt Readings from Last 3 Encounters:  10/27/18 180 lb 8 oz (81.9 kg)  10/21/18 178 lb (80.7 kg)  05/15/18 180 lb  6.4 oz (81.8 kg)     ASSESSMENT AND PLAN:  Essential hypertension -  Blood pressure relatively well controlled recently Significant concern giving renal artery stenosis on the left We have ordered renal artery ultrasound for further evaluation Unable to exclude FMD Unclear if stenosis is contributing to labile pressures  Chest pain, unspecified type - No further work-up at this time  Ascending aorta dilatation [facility](HCC) Recent CT scan showing stable 4.8 cm dilated ascending aorta Stable on several CT scans  Bicuspid aortic valve Bicuspid aortic valve on echocardiogram, minimal stenosis by measurements Stable  Diabetes mellitus type 2 in obese (HCC) Recommended low carbohydrate diet Weight stable    Total encounter time more than 25 minutes  Greater than 50% was spent in counseling and coordination of care with the patient  Disposition:   F/U  12 months   No orders of the defined types were placed in this encounter.    Signed, Dossie Arbourim Keenon Leitzel, M.D., Ph.D. 10/27/2018  [facility], [place] [phone]

## 2018-10-27 ENCOUNTER — Other Ambulatory Visit: Payer: Self-pay

## 2018-10-27 ENCOUNTER — Ambulatory Visit (INDEPENDENT_AMBULATORY_CARE_PROVIDER_SITE_OTHER): Payer: 59 | Admitting: Cardiovascular Disease

## 2018-10-27 VITALS — BP 133/91 | HR 69 | Resp 19 | Ht 66.0 in | Wt 180.5 lb

## 2018-10-27 DIAGNOSIS — I10 Essential (primary) hypertension: Secondary | ICD-10-CM | POA: Diagnosis not present

## 2018-10-27 DIAGNOSIS — I712 Thoracic aortic aneurysm, without rupture, unspecified: Secondary | ICD-10-CM

## 2018-10-27 DIAGNOSIS — I479 Paroxysmal tachycardia, unspecified: Secondary | ICD-10-CM | POA: Diagnosis not present

## 2018-10-27 DIAGNOSIS — R079 Chest pain, unspecified: Secondary | ICD-10-CM

## 2018-10-27 DIAGNOSIS — Q231 Congenital insufficiency of aortic valve: Secondary | ICD-10-CM | POA: Diagnosis not present

## 2018-10-27 DIAGNOSIS — I77810 Thoracic aortic ectasia: Secondary | ICD-10-CM

## 2018-10-27 DIAGNOSIS — E785 Hyperlipidemia, unspecified: Secondary | ICD-10-CM | POA: Diagnosis not present

## 2018-10-27 DIAGNOSIS — E1169 Type 2 diabetes mellitus with other specified complication: Secondary | ICD-10-CM | POA: Diagnosis not present

## 2018-10-27 DIAGNOSIS — I350 Nonrheumatic aortic (valve) stenosis: Secondary | ICD-10-CM | POA: Diagnosis not present

## 2018-10-27 DIAGNOSIS — E669 Obesity, unspecified: Secondary | ICD-10-CM

## 2018-10-27 DIAGNOSIS — I701 Atherosclerosis of renal artery: Secondary | ICD-10-CM | POA: Insufficient documentation

## 2018-10-27 NOTE — Patient Instructions (Addendum)
Medication Instructions:  No changes  If you need a refill on your cardiac medications before your next appointment, please call your pharmacy.    Lab work: No new labs needed   If you have labs (blood work) drawn today and your tests are completely normal, you will receive your results only by: [name]. MyChart Message (if you have MyChart) OR . A paper copy in the mail If you have any lab test that is abnormal or we need to change your treatment, we will call you to review the results.   Testing/Procedures: We will order a renal artery U/S for stenosis on CT scan Your physician has requested that you have a renal artery duplex. During this test, an ultrasound is used to evaluate blood flow to the kidneys. Allow one hour for this exam. Do not eat after midnight the day before and avoid carbonated beverages. Take your medications as you usually do.    Follow-Up: At [facility], you and your health needs are our priority.  As part of our continuing mission to provide you with exceptional heart care, we have created designated Provider Care Teams.  These Care Teams include your primary Cardiologist (physician) and Advanced Practice Providers (APPs -  Physician Assistants and Nurse Practitioners) who all work together to provide you with the care you need, when you need it.  . You will need a follow up appointment in 6 months .   Please call our office 2 months in advance to schedule this appointment.    . Providers on your designated Care Team:   . Nicolasa Duckinghristopher Berge, NP . Eula Listenyan Dunn, PA-C . Marisue IvanJacquelyn Visser, PA-C  Any Other Special Instructions Will Be Listed Below (If Applicable).  For educational health videos Log in to : www.myemmi.com Or : [email or web address], password : [email or web address]   How to Take Your Blood Pressure Blood pressure is a measurement of how strongly your blood is pressing against the walls of your arteries. Arteries are blood vessels that carry blood from your heart  throughout your body. Your health care provider takes your blood pressure at each office visit. You can also take your own blood pressure at home with a blood pressure machine. You may need to take your own blood pressure:  To confirm a diagnosis of high blood pressure (hypertension).  To monitor your blood pressure over time.  To make sure your blood pressure medicine is working. Supplies needed: To take your blood pressure, you will need a blood pressure machine. You can buy a blood pressure machine, or blood pressure monitor, at most drugstores or online. There are several types of home blood pressure monitors. When choosing one, consider the following:  Choose a monitor that has an arm cuff.  Choose a cuff that wraps snugly around your upper arm. You should be able to fit only one finger between your arm and the cuff.  Do not choose a monitor that measures your blood pressure from your wrist or finger. Your health care provider can suggest a reliable monitor that will meet your needs. How to prepare To get the most accurate reading, avoid the following for 30 minutes before you check your blood pressure:  Drinking caffeine.  Drinking alcohol.  Eating.  Smoking.  Exercising. Five minutes before you check your blood pressure:  Empty your bladder.  Sit quietly without talking in a dining chair, rather than in a soft couch or armchair. How to take your blood pressure To check your blood pressure, follow the instructions  in the manual that came with your blood pressure monitor. If you have a digital blood pressure monitor, the instructions may be as follows: 1. Sit up straight. 2. Place your feet on the floor. Do not cross your ankles or legs. 3. Rest your left arm at the level of your heart on a table or desk or on the arm of a chair. 4. Pull up your shirt sleeve. 5. Wrap the blood pressure cuff around the upper part of your left arm, 1 inch (2.5 cm) above your elbow. It is  best to wrap the cuff around bare skin. 6. Fit the cuff snugly around your arm. You should be able to place only one finger between the cuff and your arm. 7. Position the cord inside the groove of your elbow. 8. Press the power button. 9. Sit quietly while the cuff inflates and deflates. 10. Read the digital reading on the monitor screen and write it down (record it). 11. Wait 2-3 minutes, then repeat the steps, starting at step 1. What does my blood pressure reading mean? A blood pressure reading consists of a higher number over a lower number. Ideally, your blood pressure should be below 120/80. The first ("top") number is called the systolic pressure. It is a measure of the pressure in your arteries as your heart beats. The second ("bottom") number is called the diastolic pressure. It is a measure of the pressure in your arteries as the heart relaxes. Blood pressure is classified into four stages. The following are the stages for adults who do not have a short-term serious illness or a chronic condition. Systolic pressure and diastolic pressure are measured in a unit called mm Hg. Normal  Systolic pressure: below 120.  Diastolic pressure: below 80. Elevated  Systolic pressure: 120-129.  Diastolic pressure: below 80. Hypertension stage 1  Systolic pressure: 130-139.  Diastolic pressure: 80-89. Hypertension stage 2  Systolic pressure: 140 or above.  Diastolic pressure: 90 or above. You can have prehypertension or hypertension even if only the systolic or only the diastolic number in your reading is higher than normal. Follow these instructions at home:  Check your blood pressure as often as recommended by your health care provider.  Take your monitor to the next appointment with your health care provider to make sure: ? That you are using it correctly. ? That it provides accurate readings.  Be sure you understand what your goal blood pressure numbers are.  Tell your health  care provider if you are having any side effects from blood pressure medicine. Contact a health care provider if:  Your blood pressure is consistently high. Get help right away if:  Your systolic blood pressure is higher than 180.  Your diastolic blood pressure is higher than 110. This information is not intended to replace advice given to you by your health care provider. Make sure you discuss any questions you have with your health care provider. Document Released: 10/06/2015 Document Revised: 03/11/2017 Document Reviewed: 10/06/2015 Elsevier Interactive Patient Education  2019 Elsevier Inc.  Blood Pressure Record Sheet To take your blood pressure, you will need a blood pressure machine. You can buy a blood pressure machine (blood pressure monitor) at your clinic, drug store, or online. When choosing one, consider:  An automatic monitor that has an arm cuff.  A cuff that wraps snugly around your upper arm. You should be able to fit only one finger between your arm and the cuff.  A device that stores blood pressure reading  results.  Do not choose a monitor that measures your blood pressure from your wrist or finger. Follow your health care provider's instructions for how to take your blood pressure. To use this form:  Get one reading in the morning (a.m.) before you take any medicines.  Get one reading in the evening (p.m.) before supper.  Take at least 2 readings with each blood pressure check. This makes sure the results are correct. Wait 1-2 minutes between measurements.  Write down the results in the spaces on this form.  Repeat this once a week, or as told by your health care provider.  Make a follow-up appointment with your health care provider to discuss the results. Blood pressure log Date: _______________________  a.m. _____________________(1st reading) _____________________(2nd reading)  p.m. _____________________(1st reading) _____________________(2nd  reading) Date: _______________________  a.m. _____________________(1st reading) _____________________(2nd reading)  p.m. _____________________(1st reading) _____________________(2nd reading) Date: _______________________  a.m. _____________________(1st reading) _____________________(2nd reading)  p.m. _____________________(1st reading) _____________________(2nd reading) Date: _______________________  a.m. _____________________(1st reading) _____________________(2nd reading)  p.m. _____________________(1st reading) _____________________(2nd reading) Date: _______________________  a.m. _____________________(1st reading) _____________________(2nd reading)  p.m. _____________________(1st reading) _____________________(2nd reading) This information is not intended to replace advice given to you by your health care provider. Make sure you discuss any questions you have with your health care provider. Document Released: 01/26/2003 Document Revised: 04/29/2017 Document Reviewed: 04/29/2017 Elsevier Interactive Patient Education  2019 [employer].

## 2018-11-12 ENCOUNTER — Telehealth: Payer: 59 | Admitting: Family

## 2018-11-12 ENCOUNTER — Other Ambulatory Visit: Payer: Self-pay | Admitting: Internal Medicine

## 2018-11-12 DIAGNOSIS — J069 Acute upper respiratory infection, unspecified: Secondary | ICD-10-CM

## 2018-11-12 DIAGNOSIS — J329 Chronic sinusitis, unspecified: Secondary | ICD-10-CM

## 2018-11-12 DIAGNOSIS — B9789 Other viral agents as the cause of diseases classified elsewhere: Secondary | ICD-10-CM | POA: Diagnosis not present

## 2018-11-12 MED ORDER — PREDNISONE 20 MG PO TABS: 20.0000 mg | ORAL_TABLET | Freq: Every day | ORAL | 0 refills | Status: DC

## 2018-11-12 NOTE — Progress Notes (Signed)
We are sorry that you are not feeling well.  Here is how we plan to help!  Based on what you have shared with me it looks like you have sinusitis.  Sinusitis is inflammation and infection in the sinus cavities of the head.  Based on your presentation I believe you most likely have Acute Viral Sinusitis.This is an infection most likely caused by a virus. There is not specific treatment for viral sinusitis other than to help you with the symptoms until the infection runs its course.  You may use an oral decongestant such as Mucinex D or if you have glaucoma or high blood pressure use plain Mucinex. Saline nasal spray help and can safely be used as often as needed for congestion, I have prescribed: Prednisone 20 mg once a day. This will take care of any residual inflammation that the antibiotic did not clear  Some authorities believe that zinc sprays or the use of Echinacea may shorten the course of your symptoms.  Sinus infections are not as easily transmitted as other respiratory infection, however we still recommend that you avoid close contact with loved ones, especially the very young and elderly.  Remember to wash your hands thoroughly throughout the day as this is the number one way to prevent the spread of infection!  Home Care:  Only take medications as instructed by your medical team.  Do not take these medications with alcohol.  A steam or ultrasonic humidifier can help congestion.  You can place a towel over your head and breathe in the steam from hot water coming from a faucet.  Avoid close contacts especially the very young and the elderly.  Cover your mouth when you cough or sneeze.  Always remember to wash your hands.  Get Help Right Away If:  You develop worsening fever or sinus pain.  You develop a severe head ache or visual changes.  Your symptoms persist after you have completed your treatment plan.  Make sure you  Understand these instructions.  Will watch your  condition.  Will get help right away if you are not doing well or get worse.  Your e-visit answers were reviewed by a board certified advanced clinical practitioner to complete your personal care plan.  Depending on the condition, your plan could have included both over the counter or prescription medications.  If there is a problem please reply  once you have received a response from your provider.  Your safety is important to [place].  If you have drug allergies check your prescription carefully.    You can use MyChart to ask questions about today's visit, request a non-urgent call back, or ask for a work or school excuse for 24 hours related to this e-Visit. If it has been greater than 24 hours you will need to follow up with your provider, or enter a new e-Visit to address those concerns.  You will get an e-mail in the next two days asking about your experience.  I hope that your e-visit has been valuable and will speed your recovery. Thank you for using e-visits.  Greater than 5 minutes, yet less than 10 minutes of time have been spent researching, coordinating, and implementing care for this patient today.  Thank you for the details you included in the comment boxes. Those details are very helpful in determining the best course of treatment for you and help [place] to provide the best care.

## 2018-11-24 ENCOUNTER — Ambulatory Visit (INDEPENDENT_AMBULATORY_CARE_PROVIDER_SITE_OTHER): Payer: 59

## 2018-11-24 ENCOUNTER — Other Ambulatory Visit: Payer: Self-pay

## 2018-11-24 DIAGNOSIS — I701 Atherosclerosis of renal artery: Secondary | ICD-10-CM | POA: Diagnosis not present

## 2018-11-27 ENCOUNTER — Other Ambulatory Visit: Payer: Self-pay | Admitting: *Deleted

## 2018-11-27 DIAGNOSIS — I701 Atherosclerosis of renal artery: Secondary | ICD-10-CM

## 2018-11-27 NOTE — Progress Notes (Signed)
Renal artery duplex order placed.

## 2018-12-14 ENCOUNTER — Encounter: Payer: Self-pay | Admitting: Internal Medicine

## 2018-12-14 ENCOUNTER — Other Ambulatory Visit: Payer: Self-pay

## 2018-12-14 ENCOUNTER — Ambulatory Visit (INDEPENDENT_AMBULATORY_CARE_PROVIDER_SITE_OTHER): Payer: 59 | Admitting: Internal Medicine

## 2018-12-14 DIAGNOSIS — E1169 Type 2 diabetes mellitus with other specified complication: Secondary | ICD-10-CM

## 2018-12-14 DIAGNOSIS — E669 Obesity, unspecified: Secondary | ICD-10-CM

## 2018-12-14 DIAGNOSIS — J0111 Acute recurrent frontal sinusitis: Secondary | ICD-10-CM | POA: Diagnosis not present

## 2018-12-14 DIAGNOSIS — I701 Atherosclerosis of renal artery: Secondary | ICD-10-CM

## 2018-12-14 DIAGNOSIS — M489 Spondylopathy, unspecified: Secondary | ICD-10-CM | POA: Diagnosis not present

## 2018-12-14 DIAGNOSIS — I10 Essential (primary) hypertension: Secondary | ICD-10-CM | POA: Diagnosis not present

## 2018-12-14 DIAGNOSIS — J0101 Acute recurrent maxillary sinusitis: Secondary | ICD-10-CM

## 2018-12-14 DIAGNOSIS — J069 Acute upper respiratory infection, unspecified: Secondary | ICD-10-CM | POA: Diagnosis not present

## 2018-12-14 DIAGNOSIS — J0191 Acute recurrent sinusitis, unspecified: Secondary | ICD-10-CM | POA: Insufficient documentation

## 2018-12-14 MED ORDER — FLUCONAZOLE 150 MG PO TABS: 150.0000 mg | ORAL_TABLET | Freq: Every day | ORAL | 0 refills | Status: DC

## 2018-12-14 MED ORDER — HYDROCODONE-ACETAMINOPHEN 10-325 MG PO TABS: 1.0000 | ORAL_TABLET | Freq: Two times a day (BID) | ORAL | 0 refills | Status: DC | PRN

## 2018-12-14 MED ORDER — AMOXICILLIN-POT CLAVULANATE 875-125 MG PO TABS: 1.0000 | ORAL_TABLET | Freq: Two times a day (BID) | ORAL | 0 refills | Status: DC

## 2018-12-14 MED ORDER — HYDRALAZINE HCL 25 MG PO TABS: 25.0000 mg | ORAL_TABLET | Freq: Three times a day (TID) | ORAL | 1 refills | Status: DC

## 2018-12-14 MED ORDER — PREDNISONE 10 MG PO TABS: ORAL_TABLET | ORAL | 0 refills | Status: DC

## 2018-12-14 MED ORDER — FLUTICASONE PROPIONATE 50 MCG/ACT NA SUSP: 2.0000 | Freq: Every day | NASAL | 0 refills | Status: DC

## 2018-12-14 MED ORDER — TRAMADOL HCL 50 MG PO TABS: 100.0000 mg | ORAL_TABLET | Freq: Four times a day (QID) | ORAL | 5 refills | Status: DC | PRN

## 2018-12-14 MED ORDER — ESTRADIOL 0.1 MG/GM VA CREA: TOPICAL_CREAM | VAGINAL | 5 refills | Status: DC

## 2018-12-14 MED ORDER — ALPRAZOLAM 0.25 MG PO TABS: 0.2500 mg | ORAL_TABLET | Freq: Two times a day (BID) | ORAL | 5 refills | Status: DC | PRN

## 2018-12-14 NOTE — Progress Notes (Signed)
Virtual Visit via [email or web address]  This visit type was conducted due to national recommendations for restrictions regarding the COVID-19 pandemic (e.g. social distancing).  This format is felt to be most appropriate for this patient at this time.  All issues noted in this document were discussed and addressed.  No physical exam was performed (except for noted visual exam findings with Video Visits).   I connected with@ on 12/14/18 at  8:00 AM EDT by a video enabled telemedicine application or telephone and verified that I am speaking with the correct person using two identifiers. Location patient: home Location provider: work or home office Persons participating in the virtual visit: patient, provider  I discussed the limitations, risks, security and privacy concerns of performing an evaluation and management service by telephone and the availability of in person appointments. I also discussed with the patient that there may be a patient responsible charge related to this service. The patient expressed understanding and agreed to proceed.  Reason for visit: followup  HPI:  1) recurrent left sided sinusitis.  Patient reports maxillary and frontal sinus pain consistent with sinusitis  For the third time sine June .   Last treatment with augmentin  On June 14 for 5 days only.  She has been irrigating with saline washes regularly, and notes that the symptoms of frontal and maxillary sinus pain, congestion,  and purulent drainage improve transiently but recur days after each antibiotic dose has been completed.  No prior ENT evaluation,  No history of sinus surgery.   2) ER visit in June for hypertensive crisis that occurred after suspending her evening dose of carvedilol due to symptomatic hypotension.  She developed blurred vision in her left eye and a slight headache, and in the ER her  BP was 244/121.  Has had cardiology follow up and renal doppler ultrasound was repeated which noted unchanged > 60% stenosis  of left renal artery.  She is not tolerating  Amlodipine added by ER phyisician due to prolonged recurrent hypotension following each dose.   3) back pain :  Chronic,  She has been managing her pain with  tramadol and rare use of  Hydrocodone. Refill history confirmed via [place] Controlled Substance databas, accessed by me today..   ROS: See pertinent positives and negatives per HPI.  Past Medical History:  Diagnosis Date  . Aortic stenosis   . Aortic valve disorders   . Breast screening, unspecified   . Cervicalgia   . Diabetes mellitus without complication (HCC)   . Heart murmur   . Metatarsal fracture 2007   5th, from wearing high heels  . Other symptoms involving abdomen and pelvis(789.9)   . Retinal hemorrhage   . Symptomatic menopausal or female climacteric states   . Unspecified essential hypertension     Past Surgical History:  Procedure Laterality Date  . ABDOMINAL HYSTERECTOMY    . APPENDECTOMY     done during oophorectomy  . CESAREAN SECTION     x 2  . gyn surgery     hysterectomy- done for adenomysosis and endometriosis  . OOPHORECTOMY     unilateral, due to ruptured ovarian cysts  . OSTEOTOMY MAXILLARY     due to bite problems  . TUBAL LIGATION      Family History  Problem Relation Age of Onset  . Diabetes Mother   . Aortic stenosis Mother   . Cancer Father        leukemia  . Retinoblastoma Son   . Melanoma Son   .  Cancer Maternal Aunt        breast  . Breast cancer Maternal Aunt 92    SOCIAL HX:  reports that she has never smoked. She has never used smokeless tobacco. She reports current alcohol use. She reports that she does not use drugs.   Current Outpatient Medications:  .  ALPRAZolam (XANAX) 0.25 MG tablet, Take 1 tablet (0.25 mg total) by mouth 2 (two) times daily as needed., Disp: 60 tablet, Rfl: 5 .  aspirin 81 MG tablet, Take 81 mg by mouth daily., Disp: , Rfl:  .  carvedilol (COREG) 25 MG tablet, TAKE 1 TABLET BY MOUTH TWICE DAILY WITH A  MEAL., Disp: 180 tablet, Rfl: 0 .  cholecalciferol (VITAMIN D3) 25 MCG (1000 UT) tablet, Take 2,000 Units by mouth daily., Disp: , Rfl:  .  estradiol (ESTRACE) 0.1 MG/GM vaginal cream, INSERT 1/4 APPLICATORFUL VAGINALLY TWO TIMES A WEEK AS DIRECTED, Disp: 42.5 g, Rfl: 5 .  fluticasone (FLONASE) 50 MCG/ACT nasal spray, Place 2 sprays into both nostrils daily., Disp: 16 g, Rfl: 0 .  hydrochlorothiazide (HYDRODIURIL) 25 MG tablet, Take 1 tablet (25 mg total) by mouth daily. (Patient taking differently: Take 12.5 mg by mouth daily. ), Disp: 90 tablet, Rfl: 3 .  HYDROcodone-acetaminophen (NORCO) 10-325 MG tablet, Take 1 tablet by mouth every 6 (six) hours as needed., Disp: 120 tablet, Rfl: 0 .  losartan (COZAAR) 100 MG tablet, TAKE 1 TABLET BY MOUTH DAILY., Disp: 90 tablet, Rfl: 4 .  Menthol, Topical Analgesic, (BIOFREEZE EX), Apply topically., Disp: , Rfl:  .  rosuvastatin (CRESTOR) 5 MG tablet, Take 5 mg by mouth at bedtime. , Disp: , Rfl:  .  traMADol (ULTRAM) 50 MG tablet, Take 2 tablets (100 mg total) by mouth every 6 (six) hours as needed. for pain, Disp: 240 tablet, Rfl: 5 .  amoxicillin-clavulanate (AUGMENTIN) 875-125 MG tablet, Take 1 tablet by mouth 2 (two) times daily., Disp: 28 tablet, Rfl: 0 .  fluconazole (DIFLUCAN) 150 MG tablet, Take 1 tablet (150 mg total) by mouth daily., Disp: 2 tablet, Rfl: 0 .  hydrALAZINE (APRESOLINE) 25 MG tablet, Take 1 tablet (25 mg total) by mouth 3 (three) times daily., Disp: 270 tablet, Rfl: 1 .  HYDROcodone-acetaminophen (NORCO) 10-325 MG tablet, Take 1 tablet by mouth 2 (two) times daily as needed for severe pain., Disp: 60 tablet, Rfl: 0 .  predniSONE (DELTASONE) 10 MG tablet, 6 tablets daily for 3 days,  then reduce by 1 tablet daily until gone, Disp: 33 tablet, Rfl: 0  EXAM:  VITALS per patient if applicable:  GENERAL: alert, oriented, appears well and in no acute distress  HEENT: atraumatic, conjunttiva clear, no obvious abnormalities on inspection  of external nose and ears  NECK: normal movements of the head and neck  LUNGS: on inspection no signs of respiratory distress, breathing rate appears normal, no obvious gross SOB, gasping or wheezing  CV: no obvious cyanosis  MS: moves all visible extremities without noticeable abnormality  PSYCH/NEURO: pleasant and cooperative, no obvious depression or anxiety, speech and thought processing grossly intact  ASSESSMENT AND PLAN:  Discussed the following assessment and plan:  Hypertension With left sided renal artery stenosis and labile readings.   Not tolerating amlodipine due to recurrent prolonged hypotension. Continue losartan 100 mg  hctz 12.5 mg,  Carvedilol 25 mg bid,    Trial of hydralazine 25 mg tid prn systolic > 283  Diabetes mellitus type 2 in obese  Historically well-controlled on diet alone .  hemoglobin A1c has been consistently at or  less than 7.0 . Patient is up-to-date on eye exams  And a1c is due . Patient has early microalbuminuria. Patient is tolerating statin therapy for CAD risk reduction and on ACE/ARB for renal protection and hypertension   Lab Results  Component Value Date   HGBA1C 6.5 05/15/2018   Lab Results  Component Value Date   MICROALBUR 6.0 (H) 05/15/2018     Renal artery stenosis (HCC) >60% stenosis in the left renal artery.  LDL is at goal < 70 and BP is labile .  Adding hydralazine today given intolerance to amlodipine  Acute recurrent sinusitis 3rd episode in 2 months.  Last treatment June 14.  rx augmentin x 2 weeks,  Prednisone taper,  And ENT evaluation   Cervical spine disease Chronic , with cervical stenosis . Currently managed with tramadol most days,  With occasional infrequent use of vicodin . She has not had any ER visits  And has not requested any early refills.  Her Refill history was confirmed via [place] Controlled Substance database by me today during her visit and there have been no prescriptions of controlled substances filled  from any providers other than me. Refilling both today     I discussed the assessment and treatment plan with the patient. The patient was provided an opportunity to ask questions and all were answered. The patient agreed with the plan and demonstrated an understanding of the instructions.   The patient was advised to call back or seek an in-person evaluation if the symptoms worsen or if the condition fails to improve as anticipated.  I provided 40 minutes of non-face-to-face time during this encounter.   Sherlene Shamseresa L Akoni Parton, MD

## 2018-12-14 NOTE — Assessment & Plan Note (Signed)
>  60% stenosis in the left renal artery.  LDL is at goal < 70 and BP is labile .  Adding hydralazine today given intolerance to amlodipine

## 2018-12-14 NOTE — Assessment & Plan Note (Signed)
Chronic , with cervical stenosis . Currently managed with tramadol most days,  With occasional infrequent use of vicodin . She has not had any ER visits  And has not requested any early refills.  Her Refill history was confirmed via [place] Controlled Substance database by me today during her visit and there have been no prescriptions of controlled substances filled from any providers other than me. Refilling both today  

## 2018-12-14 NOTE — Assessment & Plan Note (Addendum)
With left sided renal artery stenosis and labile readings.   Not tolerating amlodipine due to recurrent prolonged hypotension. Continue losartan 100 mg  hctz 12.5 mg,  Carvedilol 25 mg bid,    Trial of hydralazine 25 mg tid prn systolic > 488

## 2018-12-14 NOTE — Assessment & Plan Note (Signed)
3rd episode in 2 months.  Last treatment June 14.  rx augmentin x 2 weeks,  Prednisone taper,  And ENT evaluation

## 2018-12-14 NOTE — Assessment & Plan Note (Signed)
Historically well-controlled on diet alone .  hemoglobin A1c has been consistently at or  less than 7.0 . Patient is up-to-date on eye exams  And a1c is due . Patient has early microalbuminuria. Patient is tolerating statin therapy for CAD risk reduction and on ACE/ARB for renal protection and hypertension   Lab Results  Component Value Date   HGBA1C 6.5 05/15/2018   Lab Results  Component Value Date   MICROALBUR 6.0 (H) 05/15/2018

## 2018-12-19 IMAGING — US US THYROID
1 series · 13 of 25 positions shown · non-contrast
Comparison: CT 05/08/2017

CLINICAL DATA: Right nodule noted on previous CT chest

EXAM:
THYROID ULTRASOUND
TECHNIQUE: Ultrasound examination of the thyroid gland and adjacent soft
tissues was performed.

[Series 1: us thyroid · 0.06mm/px · 13 of 55 slices shown]
[im 1/55]
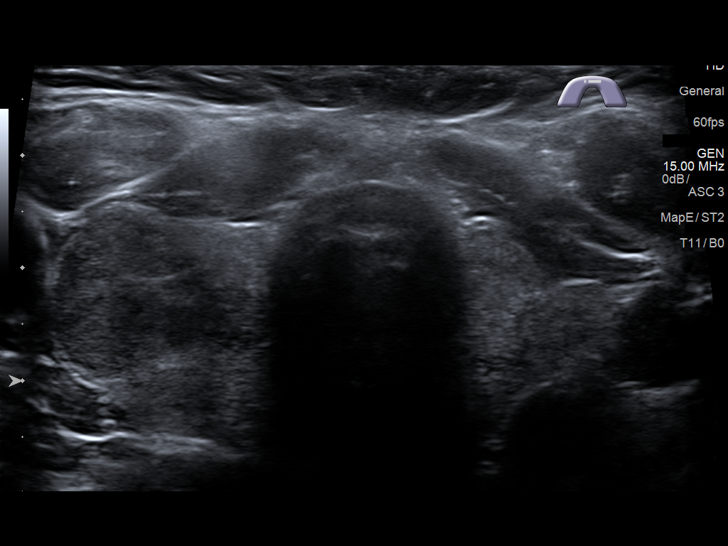
[im 5/55]
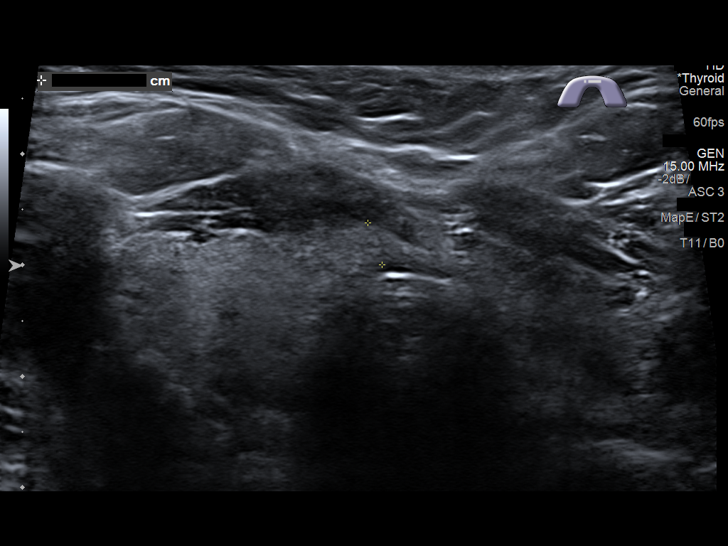
[im 10/55]
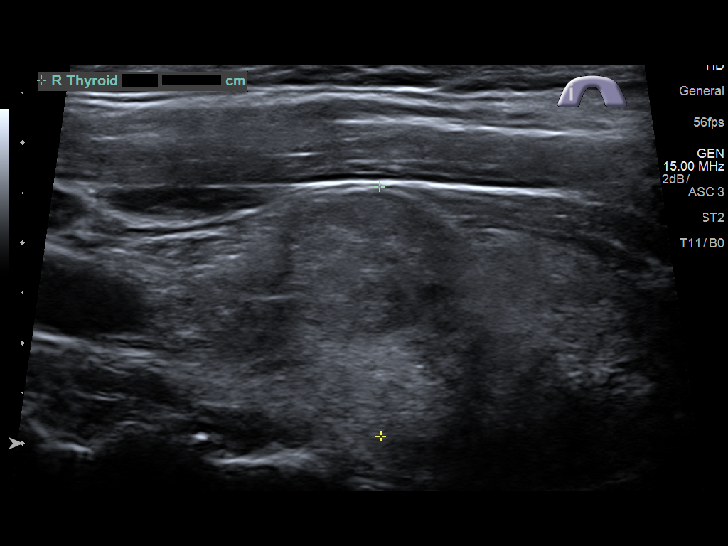
[im 14/55]
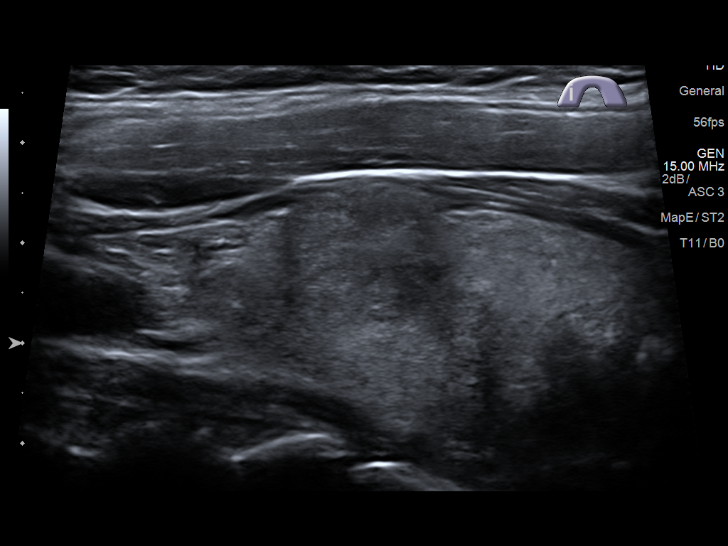
[im 19/55]
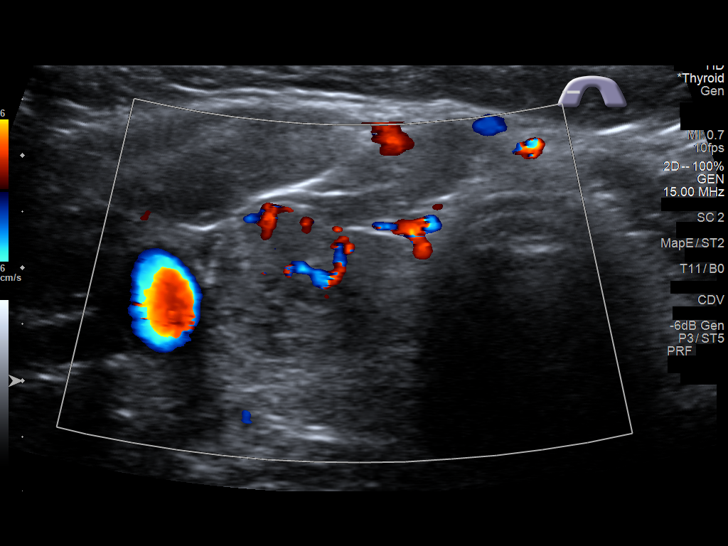
[im 23/55]
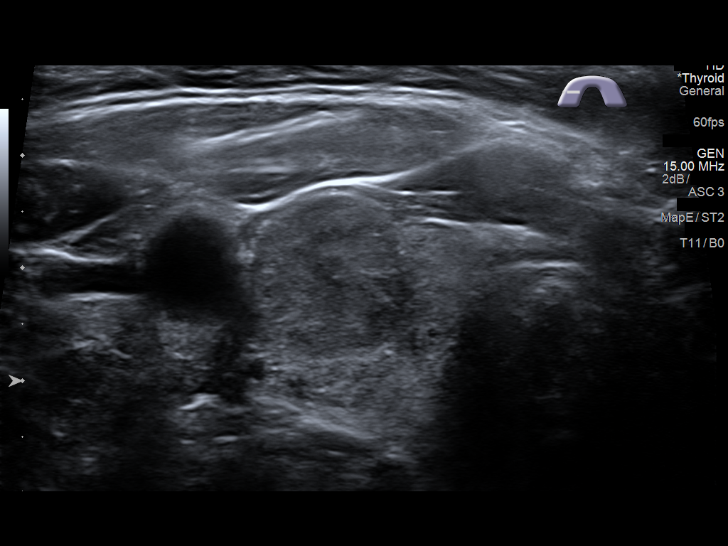
[im 28/55]
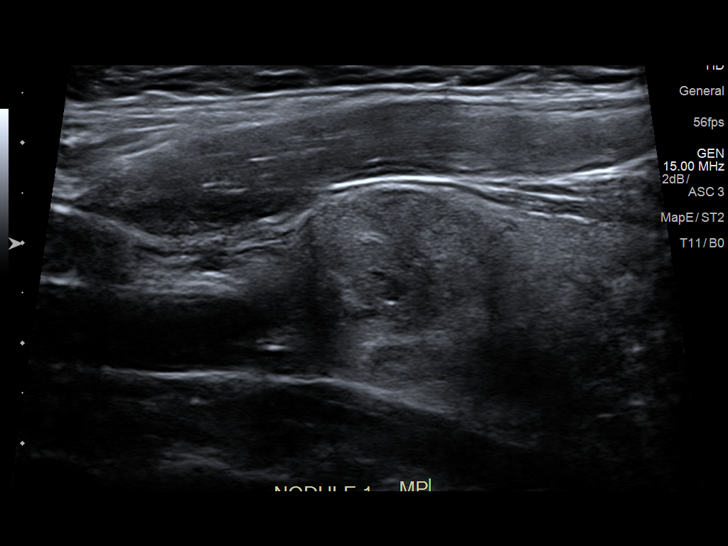
[im 32/55]
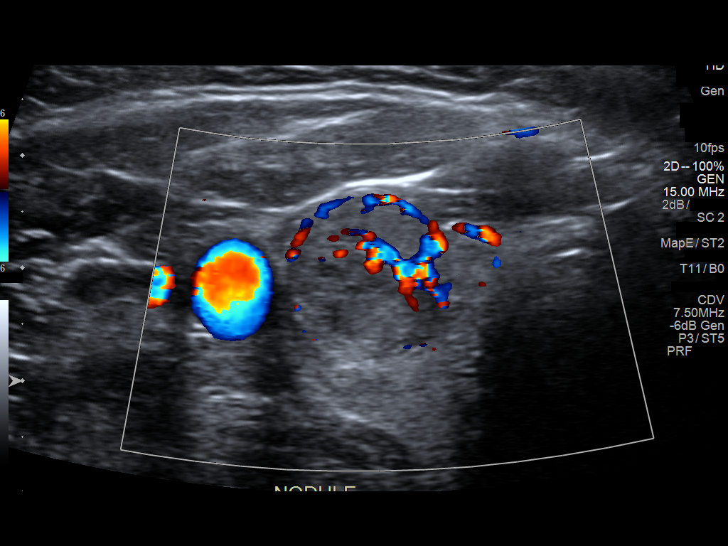
[im 37/55]
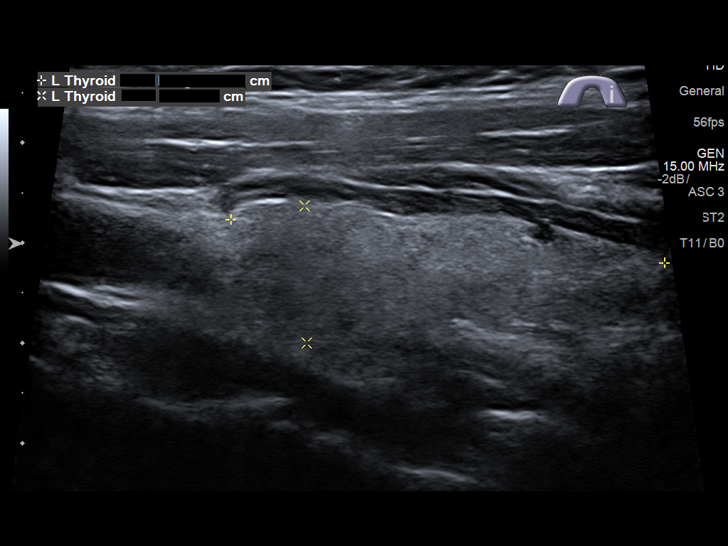
[im 41/55]
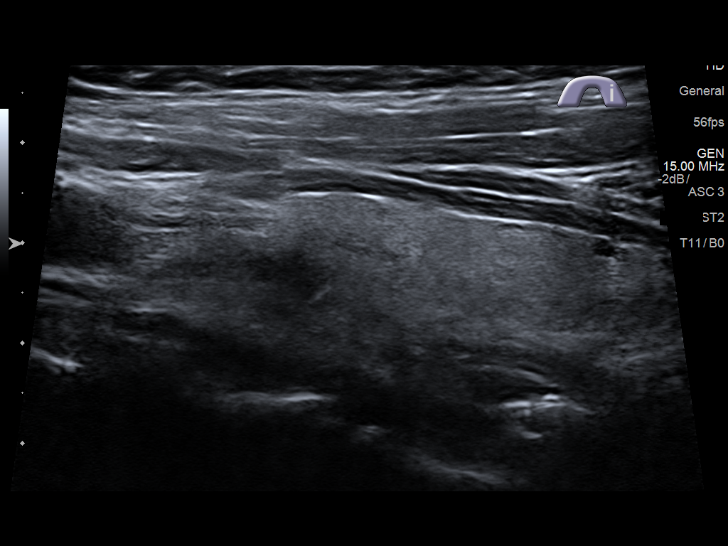
[im 46/55]
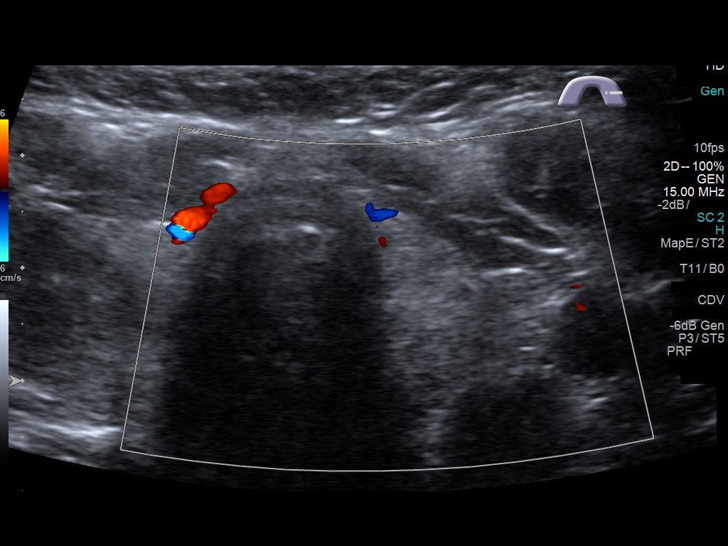
[im 50/55]
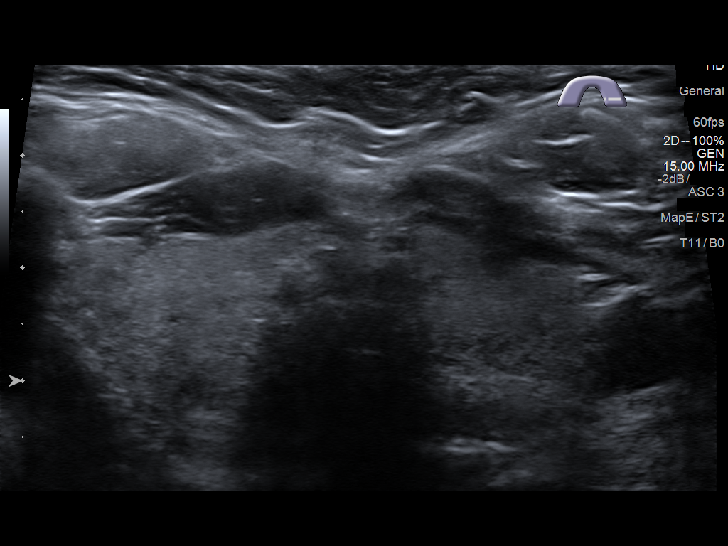
[im 55/55]
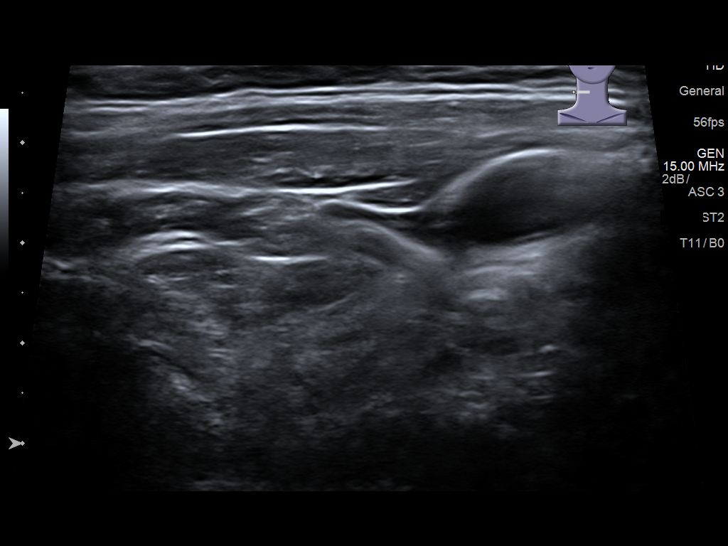

[13 of 25 positions shown; findings below may reference images not displayed]

FINDINGS: Parenchymal Echotexture: Moderately heterogenous

Isthmus: 0.4 cm thickness

Right lobe:   4.7 x 2.5 x 1.9 cm

Left lobe: 4.4 x 1.4 x 1.4 cm

_________________________________________________________

Estimated total number of nodules >/= 1 cm: 1

Number of spongiform nodules >/=  2 cm not described below (TR1): 0

Number of mixed cystic and solid nodules >/= 1.5 cm not described
below (TR2): 0

_________________________________________________________

Nodule # 1:

Location: Right; Mid

Maximum size: 1.9 cm; Other 2 dimensions: 1.5 x 1.5 cm

Composition: solid/almost completely solid (2)

Echogenicity: isoechoic (1)

Shape: not taller-than-wide (0)

Margins: ill-defined (0)

Echogenic foci: none (0)

ACR TI-RADS total points: 3.

ACR TI-RADS risk category: TR3 (3 points).

ACR TI-RADS recommendations:

*Given size (>/= 1.5 - 2.4 cm) and appearance, a follow-up
ultrasound in 1 year should be considered based on TI-RADS criteria.

_________________________________________________________
IMPRESSION: 1. Solitary 1.9 cm right thyroid nodule without worrisome
characteristics. Recommend 1 year follow-up surveillance ultrasound.

The above is in keeping with the ACR TI-RADS recommendations - [HOSPITAL] 1970;[DATE].

## 2018-12-31 ENCOUNTER — Other Ambulatory Visit: Payer: Self-pay

## 2018-12-31 ENCOUNTER — Ambulatory Visit
Admission: RE | Admit: 2018-12-31 | Discharge: 2018-12-31 | Disposition: A | Discharge status: Home / Self Care | Payer: PRIVATE HEALTH INSURANCE | Source: Ambulatory Visit | Attending: Family Medicine | Admitting: Family Medicine

## 2018-12-31 ENCOUNTER — Other Ambulatory Visit: Payer: Self-pay | Admitting: Family Medicine

## 2018-12-31 DIAGNOSIS — T1490XA Injury, unspecified, initial encounter: Secondary | ICD-10-CM

## 2018-12-31 DIAGNOSIS — R52 Pain, unspecified: Secondary | ICD-10-CM

## 2018-12-31 DIAGNOSIS — R609 Edema, unspecified: Secondary | ICD-10-CM

## 2018-12-31 DIAGNOSIS — S82899A Other fracture of unspecified lower leg, initial encounter for closed fracture: Secondary | ICD-10-CM

## 2018-12-31 HISTORY — DX: Other fracture of unspecified lower leg, initial encounter for closed fracture: S82.899A

## 2019-01-14 ENCOUNTER — Other Ambulatory Visit: Payer: Self-pay | Admitting: Internal Medicine

## 2019-01-14 DIAGNOSIS — J069 Acute upper respiratory infection, unspecified: Secondary | ICD-10-CM

## 2019-02-12 ENCOUNTER — Other Ambulatory Visit: Payer: Self-pay | Admitting: Orthopaedic Surgery

## 2019-02-12 DIAGNOSIS — S93402A Sprain of unspecified ligament of left ankle, initial encounter: Secondary | ICD-10-CM

## 2019-02-15 ENCOUNTER — Other Ambulatory Visit: Payer: Self-pay | Admitting: Internal Medicine

## 2019-02-16 ENCOUNTER — Other Ambulatory Visit: Payer: Self-pay

## 2019-02-16 MED ORDER — LOSARTAN POTASSIUM 100 MG PO TABS: 100.0000 mg | ORAL_TABLET | Freq: Every day | ORAL | 3 refills | Status: DC

## 2019-02-22 ENCOUNTER — Ambulatory Visit: Payer: 59

## 2019-03-30 ENCOUNTER — Telehealth: Payer: Self-pay | Admitting: Cardiovascular Disease

## 2019-03-30 NOTE — Telephone Encounter (Signed)
Received records request [place], forwarded to [facility] for processing. Scanned and faxed to [facility]

## 2019-04-16 ENCOUNTER — Other Ambulatory Visit: Payer: Self-pay | Admitting: Internal Medicine

## 2019-04-16 DIAGNOSIS — J069 Acute upper respiratory infection, unspecified: Secondary | ICD-10-CM

## 2019-05-17 ENCOUNTER — Other Ambulatory Visit: Payer: Self-pay | Admitting: Internal Medicine

## 2019-06-16 ENCOUNTER — Other Ambulatory Visit: Payer: Self-pay | Admitting: Cardiovascular Disease

## 2019-06-16 ENCOUNTER — Other Ambulatory Visit: Payer: Self-pay | Admitting: Internal Medicine

## 2019-06-18 ENCOUNTER — Other Ambulatory Visit: Payer: Self-pay | Admitting: Internal Medicine

## 2019-07-19 ENCOUNTER — Other Ambulatory Visit: Payer: Self-pay | Admitting: Internal Medicine

## 2019-07-19 DIAGNOSIS — J069 Acute upper respiratory infection, unspecified: Secondary | ICD-10-CM

## 2019-07-20 ENCOUNTER — Ambulatory Visit: Payer: 59 | Admitting: Cardiovascular Disease

## 2019-07-25 NOTE — Progress Notes (Signed)
Cardiology Office Note  Date:  07/26/2019   ID:  Courtney Lopez, DOB 1960-05-26, MRN [id number]  PCP:  Sherlene Shams, MD   Chief Complaint  Patient presents with  . Other    12 month follow up. Patient c/o some SOB when walking. Meds reviewed verbally with patient.     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 The aortic valve, sinuses of Valsalva, distal aortic arch, and descending thoracic aorta are normal in caliber. There is no significant atherosclerosis of the Aorta.  Walks a lot at work, Some SOB, talking at the same time as walking Unclear if this is conditioning or some other etiology  Echocardiogram reviewed from 2018, there was mild aortic valve stenosis at that time secondary to bicuspid aortic valve No follow-up study since that time  Discussed renal artery stenosis CT scan and ultrasound reviewed Cervical spine MRI reviewed no significant carotid stenosis with section it could be seen  EKG personally reviewed by myself on todays visit Shows normal sinus rhythm rate 70 bpm left axis deviation no significant ST-T wave changes   Other past medical history reviewed hospital October 21, 2018 for vision changes blood pressure at home  low at 87/46.   asymptomatic at the time and decided to forego taking her evening carvedilol and losartan.  At work, she was busy but feeling fine until around 4 AM when she developed blurriness in the left eye while working on a  computer.  She also had a mild headache, which she experiences intermittently and attributes to chronic neck problems.  She presented to the emergency department for further evaluation was found to be severely hypertensive with a blood pressure of 244/121.  She was given carvedilol 25 mg, HCTZ 12.5 mg, and 1 inch of nitroglycerin paste with some improvement in her blood pressure.    MRI of the brain did not show any acute abnormalities. Chronic left maxillary sinus disease. MRI cervical spine reviewed no significant carotid stenosis  There are solitary bilateral renal arteries with high-grade, approximately 70% stenosis of the proximal left renal artery (series 5, image 112) and apparent atherosclerosis without significant stenosis of the origin of the right renal artery.  may indicate non atherosclerotic etiology such as fibromuscular dysplasia.  Other past medical history reviewed Previously with paroxysmal tachycardia captured on her phone using pulse meter  Echo 12/2016 Dilated aorta mild 4.2 cm. Stable Aortic valve bicuspid also looks stable, mean gradient 11 mmHg Normal cardiac function  Echo December 2017 Dilated a sending aorta 4.6 cm  CT scan December 2018 Ascending aorta 4.7 cm  CT scan January 2018 ascending aorta 4.6 cm  CT scan December 2017 Aorta 4.6 cm  Previous CT scan chest 2017 and early 2017 with dilated ascending aorta  4.6 cm Very low CT coronary calcium score    PMH:   has a past medical history of Aortic stenosis, Aortic valve disorders, Breast screening, unspecified, Cervicalgia, Diabetes mellitus without complication (HCC), Heart murmur, Metatarsal fracture (2007), Other symptoms  involving abdomen and pelvis(789.9), Retinal hemorrhage, Symptomatic menopausal or female climacteric states, and Unspecified essential hypertension.  PSH:    Past Surgical History:  Procedure Laterality Date  . ABDOMINAL HYSTERECTOMY    . APPENDECTOMY     done during  oophorectomy  . CESAREAN SECTION     x 2  . gyn surgery     hysterectomy- done for adenomysosis and endometriosis  . OOPHORECTOMY     unilateral, due to ruptured ovarian cysts  . OSTEOTOMY MAXILLARY     due to bite problems  . TUBAL LIGATION      Current Outpatient Medications  Medication Sig Dispense Refill  . ALPRAZolam (XANAX) 0.25 MG tablet TAKE 1 TABLET (0.25 MG TOTAL) BY MOUTH 2 (TWO) TIMES DAILY AS NEEDED. 60 tablet 5  . aspirin 81 MG tablet Take 81 mg by mouth daily.    . carvedilol (COREG) 25 MG tablet TAKE 1 TABLET BY MOUTH TWICE DAILY WITH A MEAL. 180 tablet 1  . cholecalciferol (VITAMIN D3) 25 MCG (1000 UT) tablet Take 2,000 Units by mouth daily.    [name] estradiol (ESTRACE) 0.1 MG/GM vaginal cream INSERT 1/4 APPLICATORFUL VAGINALLY TWO TIMES A WEEK AS DIRECTED 42.5 g 5  . fluticasone (FLONASE) 50 MCG/ACT nasal spray PLACE 2 SPRAYS INTO BOTH NOSTRILS DAILY. 16 g 2  . hydrALAZINE (APRESOLINE) 25 MG tablet Take 1 tablet (25 mg total) by mouth 3 (three) times daily. 270 tablet 1  . hydrochlorothiazide (HYDRODIURIL) 25 MG tablet Take 0.5 tablets (12.5 mg total) by mouth daily. 15 tablet 3  . HYDROcodone-acetaminophen (NORCO) 10-325 MG tablet Take 1 tablet by mouth 2 (two) times daily as needed for severe pain. 60 tablet 0  . losartan (COZAAR) 100 MG tablet Take 1 tablet (100 mg total) by mouth daily. 90 tablet 3  . Menthol, Topical Analgesic, (BIOFREEZE EX) Apply topically.    . predniSONE (DELTASONE) 10 MG tablet 6 tablets daily for 3 days,  then reduce by 1 tablet daily until gone 33 tablet 0  . rosuvastatin (CRESTOR) 5 MG tablet Take 5 mg by mouth at bedtime.     . traMADol (ULTRAM) 50 MG tablet TAKE 2 TABLETS (100 MG TOTAL) BY MOUTH EVERY 6 (SIX) HOURS AS NEEDED. FOR PAIN 240 tablet 5   No current facility-administered medications for this visit.     Allergies:   Prozac [fluoxetine hcl]   Social History:  The patient  reports that she has never smoked. She has never used  smokeless tobacco. She reports current alcohol use. She reports that she does not use drugs.   Family History:   family history includes Aortic stenosis in her mother; Breast cancer (age of onset: 44) in her maternal aunt; Cancer in her father and maternal aunt; Diabetes in her mother; Melanoma in her son; Retinoblastoma in her son.    Review of Systems: Review of Systems  Constitutional: Negative.   Respiratory: Negative.   Cardiovascular: Negative.   Gastrointestinal: Negative.   Musculoskeletal: Negative.   Neurological: Negative.   Psychiatric/Behavioral: Negative.   All other systems reviewed and are negative.    PHYSICAL EXAM: VS:  BP 124/82 (BP Location: Left Arm, Patient Position: Sitting, Cuff Size: Normal)   Pulse 70   Ht 5\' 6"  (1.676 m)   Wt 186 lb 2 oz (84.4 kg)   LMP  (LMP Unknown)   SpO2 98%   BMI 30.04 kg/m  , BMI Body mass index is 30.04 kg/m. Constitutional:  oriented to person, place, and  time. No distress.  HENT:  Head: Grossly normal Eyes:  no discharge. No scleral icterus.  Neck: No JVD, no carotid bruits  Cardiovascular: Regular rate and rhythm, 2/6 systolic ejection murmur right sternal border Pulmonary/Chest: Clear to auscultation bilaterally, no wheezes or rails Abdominal: Soft.  no distension.  no tenderness.  Musculoskeletal: Normal range of motion Neurological:  normal muscle tone. Coordination normal. No atrophy Skin: Skin warm and dry Psychiatric: normal affect, pleasant  Recent Labs: 10/21/2018: ALT 14; BUN 31; Creatinine, Ser 0.75; Hemoglobin 12.7; Platelets 247; Potassium 4.0; Sodium 139    Lipid Panel Lab Results  Component Value Date   CHOL 126 05/15/2018   HDL 53.30 05/15/2018   LDLCALC 57 05/15/2018   TRIG 76.0 05/15/2018      Wt Readings from Last 3 Encounters:  07/26/19 186 lb 2 oz (84.4 kg)  10/27/18 180 lb 8 oz (81.9 kg)  10/21/18 178 lb (80.7 kg)     ASSESSMENT AND PLAN:  Essential hypertension -  Blood pressure  is well controlled on today's visit. No changes made to the medications.  Chest pain, unspecified type - No chest pain at this time, recommend a regular walking program  Ascending aorta dilatation (HCC) Recent CT scan showing stable 4.9x 4.8 cm dilation No further testing at this time  Bicuspid aortic valve Bicuspid aortic valve on echocardiogram,  Mean gradient 10 mmHg, murmur appreciated on exam For any worsening shortness of breath symptoms could consider repeat echocardiogram  Diabetes mellitus type 2 in obese (HCC) Weight stable, recommend regular walking program  Shortness of breath Suspect walking quickly and talking at the same time, unable to exclude mild component of conditioning the need to consider repeat echocardiogram for any worsening symptoms, review of aortic valve stenosis    Total encounter time more than 25 minutes  Greater than 50% was spent in counseling and coordination of care with the patient  Disposition:   F/U  12 months   Orders Placed This Encounter  Procedures  . EKG 12-Lead     Signed, Esmond Plants, M.D., Ph.D. 07/26/2019  [place], [place]

## 2019-07-26 ENCOUNTER — Ambulatory Visit (INDEPENDENT_AMBULATORY_CARE_PROVIDER_SITE_OTHER): Payer: 59 | Admitting: Cardiovascular Disease

## 2019-07-26 ENCOUNTER — Encounter: Payer: Self-pay | Admitting: Cardiovascular Disease

## 2019-07-26 ENCOUNTER — Other Ambulatory Visit: Payer: Self-pay

## 2019-07-26 VITALS — BP 124/82 | HR 70 | Ht 66.0 in | Wt 186.1 lb

## 2019-07-26 DIAGNOSIS — Q231 Congenital insufficiency of aortic valve: Secondary | ICD-10-CM | POA: Diagnosis not present

## 2019-07-26 DIAGNOSIS — E785 Hyperlipidemia, unspecified: Secondary | ICD-10-CM

## 2019-07-26 DIAGNOSIS — E669 Obesity, unspecified: Secondary | ICD-10-CM

## 2019-07-26 DIAGNOSIS — I479 Paroxysmal tachycardia, unspecified: Secondary | ICD-10-CM

## 2019-07-26 DIAGNOSIS — E119 Type 2 diabetes mellitus without complications: Secondary | ICD-10-CM

## 2019-07-26 DIAGNOSIS — R079 Chest pain, unspecified: Secondary | ICD-10-CM | POA: Diagnosis not present

## 2019-07-26 DIAGNOSIS — Q2381 Bicuspid aortic valve: Secondary | ICD-10-CM

## 2019-07-26 DIAGNOSIS — I77810 Thoracic aortic ectasia: Secondary | ICD-10-CM

## 2019-07-26 DIAGNOSIS — E1169 Type 2 diabetes mellitus with other specified complication: Secondary | ICD-10-CM

## 2019-07-26 DIAGNOSIS — I10 Essential (primary) hypertension: Secondary | ICD-10-CM | POA: Diagnosis not present

## 2019-07-26 DIAGNOSIS — I701 Atherosclerosis of renal artery: Secondary | ICD-10-CM | POA: Diagnosis not present

## 2019-07-26 NOTE — Patient Instructions (Addendum)

## 2019-08-19 ENCOUNTER — Telehealth: Payer: 59 | Admitting: Physician Assistant

## 2019-08-19 DIAGNOSIS — J329 Chronic sinusitis, unspecified: Secondary | ICD-10-CM | POA: Diagnosis not present

## 2019-08-19 MED ORDER — AMOXICILLIN-POT CLAVULANATE 875-125 MG PO TABS: 1.0000 | ORAL_TABLET | Freq: Two times a day (BID) | ORAL | 0 refills | Status: DC

## 2019-08-19 MED ORDER — PREDNISONE 20 MG PO TABS: 20.0000 mg | ORAL_TABLET | Freq: Every day | ORAL | 0 refills | Status: DC

## 2019-08-19 NOTE — Progress Notes (Signed)

## 2019-09-23 ENCOUNTER — Other Ambulatory Visit: Payer: Self-pay | Admitting: Internal Medicine

## 2019-09-23 DIAGNOSIS — Z1231 Encounter for screening mammogram for malignant neoplasm of breast: Secondary | ICD-10-CM

## 2019-09-27 ENCOUNTER — Ambulatory Visit
Admission: RE | Admit: 2019-09-27 | Discharge: 2019-09-27 | Disposition: A | Discharge status: Home / Self Care | Payer: 59 | Source: Ambulatory Visit | Attending: Internal Medicine | Admitting: Internal Medicine

## 2019-09-27 DIAGNOSIS — Z1231 Encounter for screening mammogram for malignant neoplasm of breast: Secondary | ICD-10-CM | POA: Diagnosis not present

## 2019-09-28 ENCOUNTER — Other Ambulatory Visit (HOSPITAL_COMMUNITY): Payer: Self-pay | Admitting: Otolaryngology

## 2019-09-28 ENCOUNTER — Other Ambulatory Visit: Payer: Self-pay | Admitting: Otolaryngology

## 2019-09-28 DIAGNOSIS — J324 Chronic pansinusitis: Secondary | ICD-10-CM

## 2019-09-28 DIAGNOSIS — J328 Other chronic sinusitis: Secondary | ICD-10-CM | POA: Diagnosis not present

## 2019-09-28 DIAGNOSIS — J301 Allergic rhinitis due to pollen: Secondary | ICD-10-CM | POA: Diagnosis not present

## 2019-10-07 ENCOUNTER — Ambulatory Visit: Admission: RE | Admit: 2019-10-07 | Payer: 59 | Source: Ambulatory Visit

## 2019-10-15 ENCOUNTER — Other Ambulatory Visit: Payer: Self-pay | Admitting: Internal Medicine

## 2019-10-15 ENCOUNTER — Other Ambulatory Visit: Payer: Self-pay | Admitting: Cardiovascular Disease

## 2019-10-15 DIAGNOSIS — J069 Acute upper respiratory infection, unspecified: Secondary | ICD-10-CM

## 2019-10-18 ENCOUNTER — Other Ambulatory Visit: Payer: Self-pay

## 2019-10-20 ENCOUNTER — Other Ambulatory Visit: Payer: Self-pay | Admitting: Internal Medicine

## 2019-10-20 ENCOUNTER — Ambulatory Visit: Admission: RE | Admit: 2019-10-20 | Payer: 59 | Source: Ambulatory Visit

## 2019-10-20 ENCOUNTER — Ambulatory Visit: Payer: 59 | Admitting: Internal Medicine

## 2019-10-20 ENCOUNTER — Other Ambulatory Visit: Payer: Self-pay

## 2019-10-20 ENCOUNTER — Encounter: Payer: Self-pay | Admitting: Internal Medicine

## 2019-10-20 VITALS — BP 116/68 | HR 68 | Temp 96.9°F | Resp 15 | Ht 66.0 in | Wt 186.0 lb

## 2019-10-20 DIAGNOSIS — E559 Vitamin D deficiency, unspecified: Secondary | ICD-10-CM

## 2019-10-20 DIAGNOSIS — E669 Obesity, unspecified: Secondary | ICD-10-CM

## 2019-10-20 DIAGNOSIS — E1121 Type 2 diabetes mellitus with diabetic nephropathy: Secondary | ICD-10-CM

## 2019-10-20 DIAGNOSIS — I712 Thoracic aortic aneurysm, without rupture, unspecified: Secondary | ICD-10-CM

## 2019-10-20 DIAGNOSIS — I701 Atherosclerosis of renal artery: Secondary | ICD-10-CM

## 2019-10-20 DIAGNOSIS — I10 Essential (primary) hypertension: Secondary | ICD-10-CM | POA: Diagnosis not present

## 2019-10-20 DIAGNOSIS — E041 Nontoxic single thyroid nodule: Secondary | ICD-10-CM

## 2019-10-20 DIAGNOSIS — M489 Spondylopathy, unspecified: Secondary | ICD-10-CM | POA: Diagnosis not present

## 2019-10-20 DIAGNOSIS — J0101 Acute recurrent maxillary sinusitis: Secondary | ICD-10-CM | POA: Diagnosis not present

## 2019-10-20 DIAGNOSIS — E785 Hyperlipidemia, unspecified: Secondary | ICD-10-CM

## 2019-10-20 DIAGNOSIS — E1169 Type 2 diabetes mellitus with other specified complication: Secondary | ICD-10-CM

## 2019-10-20 DIAGNOSIS — R944 Abnormal results of kidney function studies: Secondary | ICD-10-CM

## 2019-10-20 LAB — COMPREHENSIVE METABOLIC PANEL
ALT: 14 U/L (ref 0–35)
AST: 17 U/L (ref 0–37)
Albumin: 4.5 g/dL (ref 3.5–5.2)
Alkaline Phosphatase: 60 U/L (ref 39–117)
BUN: 33 mg/dL — ABNORMAL HIGH (ref 6–23)
CO2: 32 mEq/L (ref 19–32)
Calcium: 9.6 mg/dL (ref 8.4–10.5)
Chloride: 98 mEq/L (ref 96–112)
Creatinine, Ser: 1.27 mg/dL — ABNORMAL HIGH (ref 0.40–1.20)
GFR: 42.81 mL/min — ABNORMAL LOW (ref >60.00)
Glucose, Bld: 181 mg/dL — ABNORMAL HIGH (ref 70–99)
Potassium: 3.6 mEq/L (ref 3.5–5.1)
Sodium: 138 mEq/L (ref 135–145)
Total Bilirubin: 0.6 mg/dL (ref 0.2–1.2)
Total Protein: 7.2 g/dL (ref 6.0–8.3)

## 2019-10-20 LAB — MICROALBUMIN / CREATININE URINE RATIO
Creatinine,U: 173.6 mg/dL
Microalb Creat Ratio: 2.2 mg/g (ref 0.0–30.0)
Microalb, Ur: 3.9 mg/dL — ABNORMAL HIGH (ref 0.0–1.9)

## 2019-10-20 LAB — LIPID PANEL
Cholesterol: 139 mg/dL (ref 0–200)
HDL: 52.6 mg/dL (ref >39.00)
LDL Cholesterol: 64 mg/dL (ref 0–99)
NonHDL: 86.04
Total CHOL/HDL Ratio: 3
Triglycerides: 109 mg/dL (ref 0.0–149.0)
VLDL: 21.8 mg/dL (ref 0.0–40.0)

## 2019-10-20 LAB — VITAMIN D 25 HYDROXY (VIT D DEFICIENCY, FRACTURES): VITD: 55.08 ng/mL (ref 30.00–100.00)

## 2019-10-20 LAB — HEMOGLOBIN A1C: Hgb A1c MFr Bld: 7.3 % — ABNORMAL HIGH (ref 4.6–6.5)

## 2019-10-20 MED ORDER — HYDROCHLOROTHIAZIDE 12.5 MG PO TABS: 12.5000 mg | ORAL_TABLET | Freq: Every day | ORAL | 3 refills | Status: DC

## 2019-10-20 MED ORDER — HYDROCODONE-ACETAMINOPHEN 10-325 MG PO TABS: 1.0000 | ORAL_TABLET | Freq: Four times a day (QID) | ORAL | 0 refills | Status: DC | PRN

## 2019-10-20 MED ORDER — ESTRADIOL 0.1 MG/GM VA CREA: TOPICAL_CREAM | VAGINAL | 5 refills | Status: DC

## 2019-10-20 NOTE — Assessment & Plan Note (Addendum)
Left RAS > 60 by July 2020 ultrasound which should have been repeated at 6 month interval

## 2019-10-20 NOTE — Patient Instructions (Signed)
You are doing well!  Vicodin refilled for #30 tablets    Follow up in  6 months,  (3 months if you need to resume vicodin use )

## 2019-10-20 NOTE — Progress Notes (Signed)
Subjective:  Patient ID: Courtney Lopez, female    DOB: [birth year]  Age: 59 y.o. MRN: [id number]  CC: The primary encounter diagnosis was Essential hypertension. Diagnoses of Thoracic aortic aneurysm without rupture (HCC), Renal artery stenosis (HCC), Hyperlipidemia with target LDL less than 100, Diabetes mellitus type 2 in obese (HCC), Vitamin D deficiency, Acute recurrent maxillary sinusitis, Cervical spine disease, Diabetic nephropathy associated with type 2 diabetes mellitus (HCC), Hyperlipidemia associated with type 2 diabetes mellitus (HCC), and Thyroid nodule were also pertinent to this visit.  HPI Courtney Lopez presents for follow up on type 2 DM, obesity , hypertension and atherosclerosis with aortic aneurysm   This visit occurred during the SARS-CoV-2 public health emergency.  Safety protocols were in place, including screening questions prior to the visit, additional usage of staff PPE, and extensive cleaning of exam room while observing appropriate contact time as indicated for disinfecting solutions.   Last seen in office May 15 2018.    Sustained a Left tibiotalar  fracture at work last August.  Tripped over [employer] cover .  Not discovered until MRI was aone by ortho; was taken out of work for 7 weeks.   resprained TA ligament May 23   Received PT at [facility] . Being treated by Ankle specialist Dr Susa Simmonds at [facility].   Wt gain of 6 lbs due to inactivity from injury.  . Has lost 2 lbs since last Friday . Doing a 1300 cal diet with husband.  Meal planning   Work has changed to dayshift in February . BP improving with change in schedule   Seeing [facility] due to recurrent sinus issues.  Thinks her recurrent infections have been caused by  wearing of mask/PPE.  Sinus scan is ordered.  Cervical spine issues have become less bothersome in light of her new issues.  Since she becme lost to follow up she has stopped using hydrocodone, now using tramadol.  Continues to have  therapeutic massage   GAD using alprazolam   Once or twice daily     2000 Ius daily of D3,  No calcium supplements   T2DM:  She  feels generally well,  But is not  exercising regularly . Checking  blood sugars less than once daily at variable times, usually only if she feels she may be having a hypoglycemic event. .  BS have been under 130 fasting and < 165 post prandially.  Denies any recent hypoglyemic events.  Controlled with diet   Following a carbohydrate modified diet 6 days per week. Denies numbness, burning and tingling of extremities. Appetite is good.   Outpatient Medications Prior to Visit  Medication Sig Dispense Refill  . ALPRAZolam (XANAX) 0.25 MG tablet TAKE 1 TABLET (0.25 MG TOTAL) BY MOUTH 2 (TWO) TIMES DAILY AS NEEDED. 60 tablet 5  . aspirin 81 MG tablet Take 81 mg by mouth daily.    . carvedilol (COREG) 25 MG tablet TAKE 1 TABLET BY MOUTH TWICE DAILY WITH A MEAL. 180 tablet 1  . cholecalciferol (VITAMIN D3) 25 MCG (1000 UT) tablet Take 2,000 Units by mouth daily.    . fluticasone (FLONASE) 50 MCG/ACT nasal spray PLACE 2 SPRAYS INTO BOTH NOSTRILS DAILY. 16 g 2  . hydrALAZINE (APRESOLINE) 25 MG tablet Take 1 tablet (25 mg total) by mouth 3 (three) times daily. 270 tablet 1  . losartan (COZAAR) 100 MG tablet Take 1 tablet (100 mg total) by mouth daily. 90 tablet 3  . Menthol, Topical Analgesic, (BIOFREEZE  EX) Apply topically.    . rosuvastatin (CRESTOR) 5 MG tablet Take 5 mg by mouth at bedtime.     . traMADol (ULTRAM) 50 MG tablet TAKE 2 TABLETS (100 MG TOTAL) BY MOUTH EVERY 6 (SIX) HOURS AS NEEDED. FOR PAIN 240 tablet 5  . estradiol (ESTRACE) 0.1 MG/GM vaginal cream INSERT 1/4 APPLICATORFUL VAGINALLY TWO TIMES A WEEK AS DIRECTED 42.5 g 5  . hydrochlorothiazide (HYDRODIURIL) 25 MG tablet TAKE 1/2 TABLET (12.5 MG) BY MOUTH DAILY. 15 tablet 5  . HYDROcodone-acetaminophen (NORCO) 10-325 MG tablet Take 1 tablet by mouth 2 (two) times daily as needed for severe pain. 60 tablet 0    . amoxicillin-clavulanate (AUGMENTIN) 875-125 MG tablet Take 1 tablet by mouth 2 (two) times daily. (Patient not taking: Reported on 10/20/2019) 20 tablet 0  . predniSONE (DELTASONE) 20 MG tablet Take 1 tablet (20 mg total) by mouth daily with breakfast. (Patient not taking: Reported on 10/20/2019) 5 tablet 0   No facility-administered medications prior to visit.    Review of Systems;  Patient denies headache, fevers, malaise, unintentional weight loss, skin rash, eye pain, sinus congestion and sinus pain, sore throat, dysphagia,  hemoptysis , cough, dyspnea, wheezing, chest pain, palpitations, orthopnea, edema, abdominal pain, nausea, melena, diarrhea, constipation, flank pain, dysuria, hematuria, urinary  Frequency, nocturia, numbness, tingling, seizures,  Focal weakness, Loss of consciousness,  Tremor, insomnia, depression, anxiety, and suicidal ideation.      Objective:  BP 116/68 (BP Location: Left Arm, Patient Position: Sitting, Cuff Size: Normal)   Pulse 68   Temp (!) 96.9 F (36.1 C) (Temporal)   Resp 15   Ht 5\' 6"  (1.676 m)   Wt 186 lb (84.4 kg)   LMP  (LMP Unknown)   SpO2 97%   BMI 30.02 kg/m   BP Readings from Last 3 Encounters:  10/20/19 116/68  07/26/19 124/82  10/27/18 (!) 133/91    Wt Readings from Last 3 Encounters:  10/20/19 186 lb (84.4 kg)  07/26/19 186 lb 2 oz (84.4 kg)  10/27/18 180 lb 8 oz (81.9 kg)    General appearance: alert, cooperative and appears stated age Ears: normal TM's and external ear canals both ears Throat: lips, mucosa, and tongue normal; teeth and gums normal Neck: no adenopathy, no carotid bruit, supple, symmetrical, trachea midline and thyroid not enlarged, symmetric, no tenderness/mass/nodules Back: symmetric, no curvature. ROM normal. No CVA tenderness. Lungs: clear to auscultation bilaterally Heart: regular rate and rhythm, S1, S2 normal, no murmur, click, rub or gallop Abdomen: soft, non-tender; bowel sounds normal; no masses,  no  organomegaly Pulses: 2+ and symmetric Skin: Skin color, texture, turgor normal. No rashes or lesions Lymph nodes: Cervical, supraclavicular, and axillary nodes normal.  Lab Results  Component Value Date   HGBA1C 7.3 (H) 10/20/2019   HGBA1C 6.5 05/15/2018   HGBA1C 6.4 11/17/2017    Lab Results  Component Value Date   CREATININE 1.27 (H) 10/20/2019   CREATININE 0.75 10/21/2018   CREATININE 0.92 05/15/2018    Lab Results  Component Value Date   WBC 10.1 10/21/2018   HGB 12.7 10/21/2018   HCT 37.9 10/21/2018   PLT 247 10/21/2018   GLUCOSE 181 (H) 10/20/2019   CHOL 139 10/20/2019   TRIG 109.0 10/20/2019   HDL 52.60 10/20/2019   LDLDIRECT 101.0 11/11/2016   LDLCALC 64 10/20/2019   ALT 14 10/20/2019   AST 17 10/20/2019   NA 138 10/20/2019   K 3.6 10/20/2019   CL 98 10/20/2019  CREATININE 1.27 (H) 10/20/2019   BUN 33 (H) 10/20/2019   CO2 32 10/20/2019   TSH 1.36 11/17/2017   INR 1.0 10/21/2018   HGBA1C 7.3 (H) 10/20/2019   MICROALBUR 3.9 (H) 10/20/2019    MM 3D SCREEN BREAST BILATERAL  Result Date: 09/27/2019 CLINICAL DATA:  Screening. EXAM: DIGITAL SCREENING BILATERAL MAMMOGRAM WITH TOMO AND CAD COMPARISON:  Previous exam(s). ACR Breast Density Category b: There are scattered areas of fibroglandular density. FINDINGS: There are no findings suspicious for malignancy. Images were processed with CAD. IMPRESSION: No mammographic evidence of malignancy. A result letter of this screening mammogram will be mailed directly to the patient. RECOMMENDATION: Screening mammogram in one year. (Code:SM-B-01Y) BI-RADS CATEGORY  1: Negative. Electronically Signed   By: Emmaline Kluver M.D.   On: 09/27/2019 08:13    Assessment & Plan:   Problem List Items Addressed This Visit      Unprioritized   Acute recurrent sinusitis    CT sinus ordered by ENT .        Cervical spine disease    Chronic , with cervical stenosis . Currently managed with tramadol most days,  With occasional  infrequent use of vicodin . She has not had any ER visits  And has not requested any early refills.  Her Refill history was confirmed via [place] Controlled Substance database by me today during her visit and there have been no prescriptions of controlled substances filled from any providers other than me. Refilling both today       Diabetes mellitus type 2 in obese (HCC)    Recommend starting metformin for a1c now 7.3 and weight gain.   Lab Results  Component Value Date   HGBA1C 7.3 (H) 10/20/2019   Lab Results  Component Value Date   MICROALBUR 3.9 (H) 10/20/2019   MICROALBUR 6.0 (H) 05/15/2018           Relevant Medications   metFORMIN (GLUCOPHAGE XR) 750 MG 24 hr tablet   Other Relevant Orders   Hemoglobin A1c (Completed)   Microalbumin / creatinine urine ratio (Completed)   Diabetic nephropathy associated with type 2 diabetes mellitus (HCC)    Continue ARB .  BP at goal        Relevant Medications   metFORMIN (GLUCOPHAGE XR) 750 MG 24 hr tablet   Hyperlipidemia associated with type 2 diabetes mellitus (HCC)    LDL is at goal but she is not tolerating lovastatin .  Will resume rosuvastatin,  Continue fenofibrate . Return for fasting lipids  Lab Results  Component Value Date   CHOL 139 10/20/2019   HDL 52.60 10/20/2019   LDLCALC 64 10/20/2019   LDLDIRECT 101.0 11/11/2016   TRIG 109.0 10/20/2019   CHOLHDL 3 10/20/2019         Relevant Medications   metFORMIN (GLUCOPHAGE XR) 750 MG 24 hr tablet   Hypertension - Primary    Well controlled on current regimen. Renal function is elevated but she was fasting , no changes today.  Lab Results  Component Value Date   NA 138 10/20/2019   K 3.6 10/20/2019   CL 98 10/20/2019   CO2 32 10/20/2019   Lab Results  Component Value Date   CREATININE 1.27 (H) 10/20/2019         Relevant Medications   hydrochlorothiazide (HYDRODIURIL) 12.5 MG tablet   Other Relevant Orders   Comprehensive metabolic panel (Completed)    Renal artery stenosis (HCC)    Left RAS > 60 by July 2020  ultrasound which should have been repeated at 6 month interval        Relevant Medications   hydrochlorothiazide (HYDRODIURIL) 12.5 MG tablet   Thoracic aortic aneurysm without rupture (HCC)    Mild ,  With minimal but measurable increase by recent ultrasound. .  Last CT June 2010 unchanged       Relevant Medications   hydrochlorothiazide (HYDRODIURIL) 12.5 MG tablet   Thyroid nodule    Incidental finding.  Thyroid ultrasound in Dec 2019 noted two nodules , solid   TI-RADS criteria recommended repeat ultrasound in one year.  Managed by Endocrinology, but she is now overdue by 6 months for follow up imaging        Relevant Orders   US THYROID    Other Visit Diagnoses    Vitamin D deficiency       Relevant Orders   VITAMIN D 25 Hydroxy (Vit-D Deficiency, Fractures) (Completed)    A total of 45  minutes of face to face time was spent with patient more than half of which was spent in counselling and coordination of care   I have discontinued Courtney Lopez's amoxicillin-clavulanate and predniSONE. I have also changed her hydrochlorothiazide and HYDROcodone-acetaminophen. Additionally, I am having her start on metFORMIN. Lastly, I am having her maintain her aspirin, (Menthol, Topical Analgesic, (BIOFREEZE EX)), rosuvastatin, cholecalciferol, hydrALAZINE, losartan, carvedilol, ALPRAZolam, traMADol, fluticasone, and estradiol.  Meds ordered this encounter  Medications  . hydrochlorothiazide (HYDRODIURIL) 12.5 MG tablet    Sig: Take 1 tablet (12.5 mg total) by mouth daily.    Dispense:  90 tablet    Refill:  3  . estradiol (ESTRACE) 0.1 MG/GM vaginal cream    Sig: INSERT 1/4 APPLICATORFUL VAGINALLY TWO TIMES A WEEK AS DIRECTED    Dispense:  42.5 g    Refill:  5    Patient is requesting a refill. Please send to [facility] and we can mail to patient. Thank you!  [name] HYDROcodone-acetaminophen (NORCO)  10-325 MG tablet    Sig: Take 1 tablet by mouth every 6 (six) hours as needed for severe pain.    Dispense:  30 tablet    Refill:  0  . metFORMIN (GLUCOPHAGE XR) 750 MG 24 hr tablet    Sig: Take 1 tablet (750 mg total) by mouth daily with breakfast.    Dispense:  90 tablet    Refill:  0    Medications Discontinued During This Encounter  Medication Reason  . amoxicillin-clavulanate (AUGMENTIN) 875-125 MG tablet Completed Course  . predniSONE (DELTASONE) 20 MG tablet Completed Course  . hydrochlorothiazide (HYDRODIURIL) 25 MG tablet   . estradiol (ESTRACE) 0.1 MG/GM vaginal cream Reorder  . HYDROcodone-acetaminophen (NORCO) 10-325 MG tablet Reorder    Follow-up: Return in about 6 months (around 04/20/2020) for follow up diabetes.   Crecencio Mc, MD

## 2019-10-20 NOTE — Assessment & Plan Note (Signed)
Mild ,  With minimal but measurable increase by recent ultrasound. .  Last CT June 2010 unchanged

## 2019-10-21 ENCOUNTER — Telehealth: Payer: Self-pay | Admitting: Internal Medicine

## 2019-10-21 DIAGNOSIS — E1121 Type 2 diabetes mellitus with diabetic nephropathy: Secondary | ICD-10-CM | POA: Insufficient documentation

## 2019-10-21 LAB — HM DIABETES EYE EXAM

## 2019-10-21 MED ORDER — METFORMIN HCL ER 750 MG PO TB24: 750.0000 mg | ORAL_TABLET | Freq: Every day | ORAL | 0 refills | Status: DC

## 2019-10-21 NOTE — Assessment & Plan Note (Signed)
LDL is at goal but she is not tolerating lovastatin .  Will resume rosuvastatin,  Continue fenofibrate . Return for fasting lipids  Lab Results  Component Value Date   CHOL 139 10/20/2019   HDL 52.60 10/20/2019   LDLCALC 64 10/20/2019   LDLDIRECT 101.0 11/11/2016   TRIG 109.0 10/20/2019   CHOLHDL 3 10/20/2019

## 2019-10-21 NOTE — Assessment & Plan Note (Signed)
Continue ARB .  BP at goal

## 2019-10-21 NOTE — Assessment & Plan Note (Signed)
Well controlled on current regimen. Renal function is elevated but she was fasting , no changes today.  Lab Results  Component Value Date   NA 138 10/20/2019   K 3.6 10/20/2019   CL 98 10/20/2019   CO2 32 10/20/2019   Lab Results  Component Value Date   CREATININE 1.27 (H) 10/20/2019

## 2019-10-21 NOTE — Assessment & Plan Note (Signed)
Recommend starting metformin for a1c now 7.3 and weight gain.   Lab Results  Component Value Date   HGBA1C 7.3 (H) 10/20/2019   Lab Results  Component Value Date   MICROALBUR 3.9 (H) 10/20/2019   MICROALBUR 6.0 (H) 05/15/2018

## 2019-10-21 NOTE — Assessment & Plan Note (Signed)
CT sinus ordered by ENT .

## 2019-10-21 NOTE — Assessment & Plan Note (Signed)
Chronic , with cervical stenosis . Currently managed with tramadol most days,  With occasional infrequent use of vicodin . She has not had any ER visits  And has not requested any early refills.  Her Refill history was confirmed via [place] Controlled Substance database by me today during her visit and there have been no prescriptions of controlled substances filled from any providers other than me. Refilling both today

## 2019-10-21 NOTE — Telephone Encounter (Signed)
Left vm for pt to call ofc to sch US. 

## 2019-10-21 NOTE — Assessment & Plan Note (Addendum)
Incidental finding.  Thyroid ultrasound in Dec 2019 noted two nodules , solid   TI-RADS criteria recommended repeat ultrasound in one year.  Managed by Endocrinology, but she is now overdue by 6 months for follow up imaging

## 2019-10-21 NOTE — Addendum Note (Signed)
Addended by: Sherlene Shams on: 10/21/2019 03:19 PM   Modules accepted: Orders

## 2019-10-22 ENCOUNTER — Telehealth: Payer: Self-pay | Admitting: Internal Medicine

## 2019-10-22 NOTE — Telephone Encounter (Signed)
Left pt a vm to call ofc to sch US. 

## 2019-10-25 ENCOUNTER — Telehealth: Payer: Self-pay | Admitting: Internal Medicine

## 2019-10-25 NOTE — Telephone Encounter (Signed)
Left msg for pt to call ofc to sch US. 

## 2019-10-25 NOTE — Telephone Encounter (Signed)
Also I left pt 3 vm to call ofc to sch [place].

## 2019-10-26 NOTE — Telephone Encounter (Signed)
Pt called returning your call  Pt said she will call back on Monday to schedule

## 2019-10-29 ENCOUNTER — Telehealth: Payer: Self-pay | Admitting: Internal Medicine

## 2019-10-29 NOTE — Telephone Encounter (Signed)
Left msg for pt to call ofc to sch US. 

## 2019-11-02 ENCOUNTER — Telehealth: Payer: Self-pay | Admitting: Cardiovascular Disease

## 2019-11-02 DIAGNOSIS — I701 Atherosclerosis of renal artery: Secondary | ICD-10-CM

## 2019-11-02 DIAGNOSIS — R0602 Shortness of breath: Secondary | ICD-10-CM

## 2019-11-02 NOTE — Telephone Encounter (Signed)
Patient calling to schedule renal and echo.   Renal order expired and echo order not in .    Please advise when can be linked or if too soon for echo.

## 2019-11-03 NOTE — Telephone Encounter (Signed)
Left voicemail message to call back regarding request for testing.

## 2019-11-03 NOTE — Telephone Encounter (Signed)
Spoke with patient and reviewed information from last visit. Echocardiogram order and renal ultrasound order entered per providers recommendations. Confirmed appointments scheduled for testing and she verbalized understanding with no further questions at this time.

## 2019-11-08 ENCOUNTER — Ambulatory Visit
Admission: RE | Admit: 2019-11-08 | Discharge: 2019-11-08 | Disposition: A | Discharge status: Home / Self Care | Payer: 59 | Source: Ambulatory Visit | Attending: Internal Medicine | Admitting: Internal Medicine

## 2019-11-08 ENCOUNTER — Other Ambulatory Visit: Payer: Self-pay

## 2019-11-08 DIAGNOSIS — E041 Nontoxic single thyroid nodule: Secondary | ICD-10-CM | POA: Insufficient documentation

## 2019-11-09 ENCOUNTER — Ambulatory Visit
Admission: RE | Admit: 2019-11-09 | Discharge: 2019-11-09 | Disposition: A | Discharge status: Home / Self Care | Payer: 59 | Source: Ambulatory Visit | Attending: Otolaryngology | Admitting: Otolaryngology

## 2019-11-09 ENCOUNTER — Other Ambulatory Visit (INDEPENDENT_AMBULATORY_CARE_PROVIDER_SITE_OTHER): Payer: 59

## 2019-11-09 ENCOUNTER — Encounter: Payer: Self-pay | Admitting: Internal Medicine

## 2019-11-09 DIAGNOSIS — J3489 Other specified disorders of nose and nasal sinuses: Secondary | ICD-10-CM | POA: Diagnosis not present

## 2019-11-09 DIAGNOSIS — R944 Abnormal results of kidney function studies: Secondary | ICD-10-CM | POA: Diagnosis not present

## 2019-11-09 DIAGNOSIS — E041 Nontoxic single thyroid nodule: Secondary | ICD-10-CM

## 2019-11-09 DIAGNOSIS — J324 Chronic pansinusitis: Secondary | ICD-10-CM | POA: Diagnosis not present

## 2019-11-09 LAB — BASIC METABOLIC PANEL
BUN: 27 mg/dL — ABNORMAL HIGH (ref 6–23)
CO2: 33 mEq/L — ABNORMAL HIGH (ref 19–32)
Calcium: 9.5 mg/dL (ref 8.4–10.5)
Chloride: 94 mEq/L — ABNORMAL LOW (ref 96–112)
Creatinine, Ser: 1.21 mg/dL — ABNORMAL HIGH (ref 0.40–1.20)
GFR: 45.26 mL/min — ABNORMAL LOW (ref >60.00)
Glucose, Bld: 136 mg/dL — ABNORMAL HIGH (ref 70–99)
Potassium: 3.9 mEq/L (ref 3.5–5.1)
Sodium: 135 mEq/L (ref 135–145)

## 2019-11-09 LAB — TSH: TSH: 1.81 u[IU]/mL (ref 0.35–4.50)

## 2019-11-11 ENCOUNTER — Ambulatory Visit: Payer: 59

## 2019-11-15 ENCOUNTER — Other Ambulatory Visit: Payer: Self-pay | Admitting: Cardiovascular Disease

## 2019-11-22 ENCOUNTER — Telehealth: Payer: Self-pay | Admitting: *Deleted

## 2019-11-22 DIAGNOSIS — J342 Deviated nasal septum: Secondary | ICD-10-CM | POA: Diagnosis not present

## 2019-11-22 DIAGNOSIS — J320 Chronic maxillary sinusitis: Secondary | ICD-10-CM | POA: Diagnosis not present

## 2019-11-22 DIAGNOSIS — R944 Abnormal results of kidney function studies: Secondary | ICD-10-CM

## 2019-11-22 NOTE — Telephone Encounter (Signed)
Please place future orders for lab appt.  

## 2019-11-22 NOTE — Telephone Encounter (Signed)
BMET ordered.  ?

## 2019-11-23 ENCOUNTER — Other Ambulatory Visit: Payer: Self-pay

## 2019-11-23 ENCOUNTER — Other Ambulatory Visit (INDEPENDENT_AMBULATORY_CARE_PROVIDER_SITE_OTHER): Payer: 59

## 2019-11-23 ENCOUNTER — Other Ambulatory Visit: Payer: Self-pay | Admitting: Internal Medicine

## 2019-11-23 DIAGNOSIS — R944 Abnormal results of kidney function studies: Secondary | ICD-10-CM

## 2019-11-23 DIAGNOSIS — I150 Renovascular hypertension: Secondary | ICD-10-CM

## 2019-11-23 LAB — BASIC METABOLIC PANEL
BUN: 22 mg/dL (ref 6–23)
CO2: 30 mEq/L (ref 19–32)
Calcium: 9.3 mg/dL (ref 8.4–10.5)
Chloride: 102 mEq/L (ref 96–112)
Creatinine, Ser: 1.12 mg/dL (ref 0.40–1.20)
GFR: 49.48 mL/min — ABNORMAL LOW (ref >60.00)
Glucose, Bld: 119 mg/dL — ABNORMAL HIGH (ref 70–99)
Potassium: 4.5 mEq/L (ref 3.5–5.1)
Sodium: 140 mEq/L (ref 135–145)

## 2019-11-23 NOTE — Assessment & Plan Note (Signed)
Improving GFR and normalization of BUN since stopping hctz.. continue losartan,  Repeat in 2 weeks

## 2019-12-01 ENCOUNTER — Other Ambulatory Visit: Payer: Self-pay | Admitting: Internal Medicine

## 2019-12-02 LAB — HM DIABETES EYE EXAM

## 2019-12-15 ENCOUNTER — Other Ambulatory Visit: Payer: Self-pay

## 2019-12-15 ENCOUNTER — Ambulatory Visit (INDEPENDENT_AMBULATORY_CARE_PROVIDER_SITE_OTHER): Payer: 59

## 2019-12-15 ENCOUNTER — Other Ambulatory Visit: Payer: Self-pay | Admitting: Internal Medicine

## 2019-12-15 DIAGNOSIS — R0602 Shortness of breath: Secondary | ICD-10-CM

## 2019-12-15 DIAGNOSIS — I701 Atherosclerosis of renal artery: Secondary | ICD-10-CM | POA: Diagnosis not present

## 2019-12-15 LAB — ECHOCARDIOGRAM COMPLETE
AR max vel: 1.06 cm2
AV Area VTI: 1.09 cm2
AV Area mean vel: 1.04 cm2
AV Mean grad: 22.5 mmHg
AV Peak grad: 35.9 mmHg
Ao pk vel: 3 m/s
Area-P 1/2: 1.95 cm2
S' Lateral: 2.3 cm

## 2019-12-15 MED ORDER — PERFLUTREN LIPID MICROSPHERE
1.0000 mL | INTRAVENOUS | Status: AC | PRN
  Administered 2019-12-15: 2 mL via INTRAVENOUS

## 2019-12-15 NOTE — Telephone Encounter (Signed)
Refill request for xanax, last seen 10-20-19, last filled 06-19-19.  Please advise.

## 2019-12-21 ENCOUNTER — Telehealth: Payer: Self-pay | Admitting: *Deleted

## 2019-12-21 ENCOUNTER — Other Ambulatory Visit: Payer: Self-pay | Admitting: Internal Medicine

## 2019-12-21 ENCOUNTER — Other Ambulatory Visit: Payer: 59

## 2019-12-21 NOTE — Telephone Encounter (Signed)
Refill request for tramadol, last seen 10-20-2019, last filled 06-19-2019.  Please advise.

## 2019-12-21 NOTE — Telephone Encounter (Signed)
Left voicemail message to call back if she would like [place] to arrange appointment with Dr. Kirke Corin.

## 2019-12-21 NOTE — Telephone Encounter (Signed)
-----   Message from Antonieta Iba, MD sent at 12/15/2019 11:40 AM EDT ----- Regarding: renal stenosis Aram Beecham Received message from [facility] Increased left renal artery velocity ultrasound measurement, 400 cm/s or higher Higher than last year, concerning for worsening stenosis In the setting of some renal dysfunction, we may need to establish care with interventional Would recommend referral to meet with Dr. Kirke Corin or vascular in [place] if she prefers so they can help determine if and when a stent might be needed to renal artery on left Thx TG

## 2019-12-22 NOTE — Telephone Encounter (Signed)
Spoke with patient and reviewed provider recommendations. She was agreeable with plan and scheduled appointment with Dr. Kirke Corin next week. Patient mentioned that she has no plaque but has Fibromusclar Dysplagia. She was appreciative for the call with no further questions at this time. Confirmed appointment and time.

## 2019-12-22 NOTE — Telephone Encounter (Signed)
-----   Message from Timothy J Gollan, MD sent at 12/15/2019 11:40 AM EDT ----- Regarding: renal stenosis Hey Pam Received message from [place] Increased left renal artery velocity ultrasound measurement, 400 cm/s or higher Higher than last year, concerning for worsening stenosis In the setting of some renal dysfunction, we may need to establish care with interventional Would recommend referral to meet with Dr. Arida or vascular in [place] if she prefers so they can help determine if and when a stent might be needed to renal artery on left Thx TG  

## 2019-12-28 ENCOUNTER — Ambulatory Visit (INDEPENDENT_AMBULATORY_CARE_PROVIDER_SITE_OTHER): Payer: 59 | Admitting: Cardiovascular Disease

## 2019-12-28 ENCOUNTER — Encounter: Payer: Self-pay | Admitting: Cardiovascular Disease

## 2019-12-28 ENCOUNTER — Other Ambulatory Visit: Payer: Self-pay

## 2019-12-28 VITALS — BP 142/92 | HR 64 | Ht 66.0 in | Wt 180.0 lb

## 2019-12-28 DIAGNOSIS — H538 Other visual disturbances: Secondary | ICD-10-CM

## 2019-12-28 DIAGNOSIS — I359 Nonrheumatic aortic valve disorder, unspecified: Secondary | ICD-10-CM

## 2019-12-28 DIAGNOSIS — I701 Atherosclerosis of renal artery: Secondary | ICD-10-CM

## 2019-12-28 DIAGNOSIS — I773 Arterial fibromuscular dysplasia: Secondary | ICD-10-CM | POA: Diagnosis not present

## 2019-12-28 DIAGNOSIS — I10 Essential (primary) hypertension: Secondary | ICD-10-CM | POA: Diagnosis not present

## 2019-12-28 DIAGNOSIS — I7121 Aneurysm of the ascending aorta, without rupture: Secondary | ICD-10-CM

## 2019-12-28 DIAGNOSIS — I712 Thoracic aortic aneurysm, without rupture: Secondary | ICD-10-CM

## 2019-12-28 MED ORDER — AMLODIPINE BESYLATE 5 MG PO TABS: 5.0000 mg | ORAL_TABLET | Freq: Every day | ORAL | 5 refills | Status: DC

## 2019-12-28 NOTE — Patient Instructions (Signed)
Medication Instructions:  Your physician has recommended you make the following change in your medication:   START Amlodipine 5 mg daily. An Rx has been sent to your pharmacy.   *If you need a refill on your cardiac medications before your next appointment, please call your pharmacy*   Lab Work: None ordered  If you have labs (blood work) drawn today and your tests are completely normal, you will receive your results only by: [name] MyChart Message (if you have MyChart) OR . A paper copy in the mail If you have any lab test that is abnormal or we need to change your treatment, we will call you to review the results.   Testing/Procedures: Your physician has requested that you have a carotid duplex. This test is an ultrasound of the carotid arteries in your neck. It looks at blood flow through these arteries that supply the brain with blood. Allow one hour for this exam. There are no restrictions or special instructions.     Follow-Up: At [facility], you and your health needs are our priority.  As part of our continuing mission to provide you with exceptional heart care, we have created designated Provider Care Teams.  These Care Teams include your primary Cardiologist (physician) and Advanced Practice Providers (APPs -  Physician Assistants and Nurse Practitioners) who all work together to provide you with the care you need, when you need it.  We recommend signing up for the patient portal called "MyChart".  Sign up information is provided on this After Visit Summary.  MyChart is used to connect with patients for Virtual Visits (Telemedicine).  Patients are able to view lab/test results, encounter notes, upcoming appointments, etc.  Non-urgent messages can be sent to your provider as well.   To learn more about what you can do with MyChart, go to [email or web address].    Your next appointment:   4 week(s)  The format for your next appointment:   In Person  Provider:   Lorine Bears, MD  only  Other Instructions N/A

## 2019-12-28 NOTE — Progress Notes (Signed)
Cardiology Office Note   Date:  12/28/2019   ID:  Courtney Lopez, DOB Jul 24, 1960, MRN [id number]  PCP:  Sherlene Shams, MD  Cardiologist:  Dr. Mariah Milling  Chief Complaint  Patient presents with  . Other    Follow up post Renal. meds reviewed verbally with patient.       History of Present Illness: Courtney Lopez is a 59 y.o. female who was referred by Dr. Mariah Milling for evaluation of renal artery stenosis. She has known history of bicuspid aortic valve with ascending aortic aneurysm, renal artery stenosis, hyperlipidemia and type 2 diabetes. She had CTA of the aorta in 2020 which showed evidence of significant left renal artery stenosis with lack of significant calcifications. This was suggestive of fibromuscular dysplasia. Left renal artery stenosis was confirmed with renal artery duplex. Her blood pressure has been difficult to control overall and currently she is on maximal dose carvedilol and losartan. She uses hydralazine as needed. She was on hydrochlorothiazide but that was associated with worsening renal function and thus it was discontinued. Her blood pressure continues to be elevated. Recent renal artery duplex showed evidence of significant stenosis in the mid left renal artery with velocity of 439. She recently started having some blurred vision bilaterally.   Past Medical History:  Diagnosis Date  . Aortic stenosis   . Aortic valve disorders   . Breast screening, unspecified   . Cervicalgia   . Diabetes mellitus without complication (HCC)   . Heart murmur   . Metatarsal fracture 2007   5th, from wearing high heels  . Other symptoms involving abdomen and pelvis(789.9)   . Retinal hemorrhage   . Symptomatic menopausal or female climacteric states   . Unspecified essential hypertension     Past Surgical History:  Procedure Laterality Date  . ABDOMINAL HYSTERECTOMY    . APPENDECTOMY     done during oophorectomy  . CESAREAN SECTION     x 2  . gyn surgery      hysterectomy- done for adenomysosis and endometriosis  . OOPHORECTOMY     unilateral, due to ruptured ovarian cysts  . OSTEOTOMY MAXILLARY     due to bite problems  . TUBAL LIGATION       Current Outpatient Medications  Medication Sig Dispense Refill  . ALPRAZolam (XANAX) 0.25 MG tablet TAKE 1 TABLET BY MOUTH TWO TIMES DAILY AS NEEDED 60 tablet 5  . carvedilol (COREG) 25 MG tablet TAKE 1 TABLET BY MOUTH TWICE DAILY WITH A MEAL. 180 tablet 1  . cholecalciferol (VITAMIN D3) 25 MCG (1000 UT) tablet Take 2,000 Units by mouth daily.    [name] estradiol (ESTRACE) 0.1 MG/GM vaginal cream INSERT 1/4 APPLICATORFUL VAGINALLY TWO TIMES A WEEK AS DIRECTED 42.5 g 5  . fluticasone (FLONASE) 50 MCG/ACT nasal spray PLACE 2 SPRAYS INTO BOTH NOSTRILS DAILY. 16 g 2  . hydrALAZINE (APRESOLINE) 25 MG tablet Take 1 tablet (25 mg total) by mouth 3 (three) times daily. 270 tablet 1  . HYDROcodone-acetaminophen (NORCO) 10-325 MG tablet Take 1 tablet by mouth every 6 (six) hours as needed for severe pain. 30 tablet 0  . losartan (COZAAR) 100 MG tablet Take 1 tablet (100 mg total) by mouth daily. 90 tablet 3  . Menthol, Topical Analgesic, (BIOFREEZE EX) Apply topically.    . metFORMIN (GLUCOPHAGE XR) 750 MG 24 hr tablet Take 1 tablet (750 mg total) by mouth daily with breakfast. 90 tablet 0  . rosuvastatin (CRESTOR) 5 MG tablet Take  5 mg by mouth at bedtime.     . traMADol (ULTRAM) 50 MG tablet TAKE 2 TABLETS BY MOUTH EVERY 6 HOURS AS NEEDED FOR PAIN 240 tablet 5   No current facility-administered medications for this visit.    Allergies:   Prozac [fluoxetine hcl]    Social History:  The patient  reports that she has never smoked. She has never used smokeless tobacco. She reports current alcohol use. She reports that she does not use drugs.   Family History:  The patient's family history includes Aortic stenosis in her mother; Breast cancer (age of onset: 29) in her maternal aunt; Cancer in her father and  maternal aunt; Diabetes in her mother; Melanoma in her son; Retinoblastoma in her son.    ROS:  Please see the history of present illness.   Otherwise, review of systems are positive for none.   All other systems are reviewed and negative.    PHYSICAL EXAM: VS:  BP (!) 142/92 (BP Location: Left Arm, Patient Position: Sitting, Cuff Size: Normal)   Pulse 64   Ht 5\' 6"  (1.676 m)   Wt 180 lb (81.6 kg)   LMP  (LMP Unknown)   SpO2 98%   BMI 29.05 kg/m  , BMI Body mass index is 29.05 kg/m. GEN: Well nourished, well developed, in no acute distress  HEENT: normal  Neck: no JVD, carotid bruits, or masses Cardiac: RRR; no  rubs, or gallops,no edema . 1 out of 6 systolic murmur in the aortic area Respiratory:  clear to auscultation bilaterally, normal work of breathing GI: soft, nontender, nondistended, + BS MS: no deformity or atrophy  Skin: warm and dry, no rash Neuro:  Strength and sensation are intact Psych: euthymic mood, full affect   EKG:  EKG is ordered today. The ekg ordered today demonstrates normal sinus rhythm with left axis deviation.   Recent Labs: 10/20/2019: ALT 14 11/09/2019: TSH 1.81 11/23/2019: BUN 22; Creatinine, Ser 1.12; Potassium 4.5; Sodium 140    Lipid Panel    Component Value Date/Time   CHOL 139 10/20/2019 1117   CHOL 175 02/17/2013 0746   TRIG 109.0 10/20/2019 1117   TRIG 100 02/17/2013 0746   HDL 52.60 10/20/2019 1117   HDL 53 02/17/2013 0746   CHOLHDL 3 10/20/2019 1117   VLDL 21.8 10/20/2019 1117   VLDL 20 02/17/2013 0746   LDLCALC 64 10/20/2019 1117   LDLCALC 102 (H) 02/17/2013 0746   LDLDIRECT 101.0 11/11/2016 0906      Wt Readings from Last 3 Encounters:  12/28/19 180 lb (81.6 kg)  10/20/19 186 lb (84.4 kg)  07/26/19 186 lb 2 oz (84.4 kg)       No flowsheet data found.    ASSESSMENT AND PLAN:  1. Renal artery stenosis: Likely due to fibromuscular dysplasia given the lack of significant atherosclerosis and the location in the  midsegment. I discussed with her the indications for renal artery revascularization. Most recent creatinine was 1.12. Renal function improved after stopping hydrochlorothiazide. I recommend revascularization if blood pressure is not controlled on 3 different antihypertensive medications. I elected to add amlodipine 5 mg once daily. We have to get her blood pressure below 130/80 especially with the presence of a sending aortic aneurysm. If we cannot achieve that with medications, I recommend proceeding with renal artery angiography and possible endovascular intervention. I will reevaluate her response to amlodipine in 1 month.  2. Blurred vision: She is at high risk for fibromuscular dysplasia affecting the carotid arteries.  I requested carotid Doppler.  3. Likely bicuspid aortic valve with ascending aortic aneurysm. Most recent echo suggested tricuspid valve but I reviewed the images and likely has functional bicuspid valve. TEE might be helpful if there is a question about this as it might affect the threshold for repairing the ascending aortic aneurysm.  4. Ascending aortic aneurysm. Most recently this measured 48 x 49 mm. Indication for repair if this reaches 50 mm in the presence of bicuspid aortic valve.    Disposition:   FU with me in 1 month  Signed,  Lorine Bears, MD  12/28/2019 4:52 PM    [facility]

## 2019-12-29 ENCOUNTER — Other Ambulatory Visit: Payer: 59

## 2019-12-29 ENCOUNTER — Telehealth: Payer: Self-pay | Admitting: Cardiovascular Disease

## 2019-12-29 NOTE — Telephone Encounter (Addendum)
Attempted to schedule.  LMOV to call office.      Patient needs carotid and 4 week fu per chkout 12/28/19 JH Arida only

## 2020-01-14 ENCOUNTER — Other Ambulatory Visit: Payer: Self-pay | Admitting: Internal Medicine

## 2020-01-14 ENCOUNTER — Other Ambulatory Visit: Payer: Self-pay

## 2020-01-14 ENCOUNTER — Other Ambulatory Visit (INDEPENDENT_AMBULATORY_CARE_PROVIDER_SITE_OTHER): Payer: 59

## 2020-01-14 DIAGNOSIS — I150 Renovascular hypertension: Secondary | ICD-10-CM | POA: Diagnosis not present

## 2020-01-14 DIAGNOSIS — J069 Acute upper respiratory infection, unspecified: Secondary | ICD-10-CM

## 2020-01-14 LAB — BASIC METABOLIC PANEL
BUN: 21 mg/dL (ref 6–23)
CO2: 30 mEq/L (ref 19–32)
Calcium: 9.2 mg/dL (ref 8.4–10.5)
Chloride: 100 mEq/L (ref 96–112)
Creatinine, Ser: 0.93 mg/dL (ref 0.40–1.20)
GFR: 61.29 mL/min (ref >60.00)
Glucose, Bld: 121 mg/dL — ABNORMAL HIGH (ref 70–99)
Potassium: 4.4 mEq/L (ref 3.5–5.1)
Sodium: 139 mEq/L (ref 135–145)

## 2020-01-15 ENCOUNTER — Telehealth: Payer: Self-pay | Admitting: Internal Medicine

## 2020-01-15 ENCOUNTER — Encounter: Payer: Self-pay | Admitting: Internal Medicine

## 2020-01-15 NOTE — Telephone Encounter (Signed)
MyChart message sent  [facility]

## 2020-01-18 ENCOUNTER — Other Ambulatory Visit: Payer: Self-pay | Admitting: Internal Medicine

## 2020-01-25 ENCOUNTER — Other Ambulatory Visit: Payer: Self-pay | Admitting: Dentistry

## 2020-01-27 ENCOUNTER — Ambulatory Visit (INDEPENDENT_AMBULATORY_CARE_PROVIDER_SITE_OTHER): Payer: 59

## 2020-01-27 ENCOUNTER — Other Ambulatory Visit: Payer: Self-pay

## 2020-01-27 DIAGNOSIS — H538 Other visual disturbances: Secondary | ICD-10-CM | POA: Diagnosis not present

## 2020-01-27 DIAGNOSIS — I773 Arterial fibromuscular dysplasia: Secondary | ICD-10-CM | POA: Diagnosis not present

## 2020-02-01 ENCOUNTER — Encounter: Payer: Self-pay | Admitting: Cardiovascular Disease

## 2020-02-01 ENCOUNTER — Telehealth (INDEPENDENT_AMBULATORY_CARE_PROVIDER_SITE_OTHER): Payer: 59 | Admitting: Cardiovascular Disease

## 2020-02-01 ENCOUNTER — Other Ambulatory Visit: Payer: Self-pay

## 2020-02-01 ENCOUNTER — Telehealth: Payer: Self-pay | Admitting: Cardiovascular Disease

## 2020-02-01 VITALS — BP 122/76 | HR 84 | Ht 66.0 in | Wt 179.0 lb

## 2020-02-01 DIAGNOSIS — I712 Thoracic aortic aneurysm, without rupture: Secondary | ICD-10-CM

## 2020-02-01 DIAGNOSIS — I701 Atherosclerosis of renal artery: Secondary | ICD-10-CM

## 2020-02-01 DIAGNOSIS — I359 Nonrheumatic aortic valve disorder, unspecified: Secondary | ICD-10-CM

## 2020-02-01 DIAGNOSIS — I7121 Aneurysm of the ascending aorta, without rupture: Secondary | ICD-10-CM

## 2020-02-01 MED ORDER — ROSUVASTATIN CALCIUM 5 MG PO TABS: 5.0000 mg | ORAL_TABLET | Freq: Every day | ORAL | 2 refills | Status: DC

## 2020-02-01 NOTE — Progress Notes (Signed)
Virtual Visit via Telephone Note   This visit type was conducted due to national recommendations for restrictions regarding the COVID-19 Pandemic (e.g. social distancing) in an effort to limit this patient's exposure and mitigate transmission in our community.  Due to her co-morbid illnesses, this patient is at least at moderate risk for complications without adequate follow up.  This format is felt to be most appropriate for this patient at this time.  The patient did not have access to video technology/had technical difficulties with video requiring transitioning to audio format only (telephone).  All issues noted in this document were discussed and addressed.  No physical exam could be performed with this format.  Please refer to the patient's chart for her  consent to telehealth for [facility].    Date:  02/01/2020   ID:  Courtney Lopez, DOB [birth year], MRN [id number] The patient was identified using 2 identifiers.  Patient Location: Home Provider Location: Office/Clinic  PCP:  Sherlene Shams, MD  Cardiologist:  Dr. Mariah Milling Electrophysiologist:  None   Evaluation Performed:  Follow-Up Visit  Chief Complaint: Follow-up visit.  History of Present Illness:    Courtney Lopez is a 59 y.o. female was reached via phone for a follow-up visit regarding renal artery stenosis.  She has known history of bicuspid aortic valve with ascending aortic aneurysm, renal artery stenosis, hyperlipidemia and type 2 diabetes. She had CTA of the aorta in 2020 which showed evidence of significant left renal artery stenosis with lack of significant calcifications. This was suggestive of fibromuscular dysplasia. Left renal artery stenosis was confirmed with renal artery duplex. She had renal artery duplex that showed significant stenosis in the mid left renal artery with peak velocity of 439.  During her last visit, I added amlodipine 5 mg once daily.  She was taking maximal dose carvedilol and  losartan.  Since then, blood pressure improved significantly and if anything, she had episodes of hypotension with dizziness. She did have some blurred vision recently and carotid Doppler showed no significant disease.  She denies chest pain or shortness of breath.  She did have some fever and sinus congestion and thus she is being tested for Covid but is known to have sinus disease.   Past Medical History:  Diagnosis Date  . Aortic stenosis   . Aortic valve disorders   . Breast screening, unspecified   . Cervicalgia   . Diabetes mellitus without complication (HCC)   . Heart murmur   . Metatarsal fracture 2007   5th, from wearing high heels  . Other symptoms involving abdomen and pelvis(789.9)   . Retinal hemorrhage   . Symptomatic menopausal or female climacteric states   . Unspecified essential hypertension    Past Surgical History:  Procedure Laterality Date  . ABDOMINAL HYSTERECTOMY    . APPENDECTOMY     done during oophorectomy  . CESAREAN SECTION     x 2  . gyn surgery     hysterectomy- done for adenomysosis and endometriosis  . OOPHORECTOMY     unilateral, due to ruptured ovarian cysts  . OSTEOTOMY MAXILLARY     due to bite problems  . TUBAL LIGATION       Current Meds  Medication Sig  . ALPRAZolam (XANAX) 0.25 MG tablet TAKE 1 TABLET BY MOUTH TWO TIMES DAILY AS NEEDED  . amLODipine (NORVASC) 5 MG tablet Take 1 tablet (5 mg total) by mouth daily.  . carvedilol (COREG) 25 MG tablet TAKE 1 TABLET BY  MOUTH TWICE DAILY WITH A MEAL.  . cholecalciferol (VITAMIN D3) 25 MCG (1000 UT) tablet Take 2,000 Units by mouth daily.  [name] estradiol (ESTRACE) 0.1 MG/GM vaginal cream INSERT 1/4 APPLICATORFUL VAGINALLY TWO TIMES A WEEK AS DIRECTED  . fluticasone (FLONASE) 50 MCG/ACT nasal spray PLACE 2 SPRAYS INTO BOTH NOSTRILS DAILY.  [name] HYDROcodone-acetaminophen (NORCO) 10-325 MG tablet Take 1 tablet by mouth every 6 (six) hours as needed for severe pain.  [name] losartan (COZAAR) 100 MG  tablet Take 1 tablet (100 mg total) by mouth daily.  . Menthol, Topical Analgesic, (BIOFREEZE EX) Apply topically.  . metFORMIN (GLUCOPHAGE-XR) 750 MG 24 hr tablet TAKE 1 TABLET BY MOUTH DAILY WITH BREAKFAST.  . rosuvastatin (CRESTOR) 5 MG tablet Take 5 mg by mouth at bedtime.   . traMADol (ULTRAM) 50 MG tablet TAKE 2 TABLETS BY MOUTH EVERY 6 HOURS AS NEEDED FOR PAIN     Allergies:   Hctz [hydrochlorothiazide] and Prozac [fluoxetine hcl]   Social History   Tobacco Use  . Smoking status: Never Smoker  . Smokeless tobacco: Never Used  Substance Use Topics  . Alcohol use: Yes    Comment: occassional  . Drug use: No     Family Hx: The patient's family history includes Aortic stenosis in her mother; Breast cancer (age of onset: 61) in her maternal aunt; Cancer in her father and maternal aunt; Diabetes in her mother; Melanoma in her son; Retinoblastoma in her son.  ROS:   Please see the history of present illness.     All other systems reviewed and are negative.   Prior CV studies:   The following studies were reviewed today:  Reviewed results of recent carotid Doppler with her.  Labs/Other Tests and Data Reviewed:    EKG:  No ECG reviewed.  Recent Labs: 10/20/2019: ALT 14 11/09/2019: TSH 1.81 01/14/2020: BUN 21; Creatinine, Ser 0.93; Potassium 4.4; Sodium 139   Recent Lipid Panel Lab Results  Component Value Date/Time   CHOL 139 10/20/2019 11:17 AM   CHOL 175 02/17/2013 07:46 AM   TRIG 109.0 10/20/2019 11:17 AM   TRIG 100 02/17/2013 07:46 AM   HDL 52.60 10/20/2019 11:17 AM   HDL 53 02/17/2013 07:46 AM   CHOLHDL 3 10/20/2019 11:17 AM   LDLCALC 64 10/20/2019 11:17 AM   LDLCALC 102 (H) 02/17/2013 07:46 AM   LDLDIRECT 101.0 11/11/2016 09:06 AM    Wt Readings from Last 3 Encounters:  02/01/20 179 lb (81.2 kg)  12/28/19 180 lb (81.6 kg)  10/20/19 186 lb (84.4 kg)     Objective:    Vital Signs:  BP 122/76 (BP Location: Left Arm, Patient Position: Sitting, Cuff Size:  Normal)   Pulse 84   Ht 5\' 6"  (1.676 m)   Wt 179 lb (81.2 kg)   LMP  (LMP Unknown)   BMI 28.89 kg/m    VITAL SIGNS:  reviewed  ASSESSMENT & PLAN:    1. Renal artery stenosis: Likely due to fibromuscular dysplasia given the lack of significant atherosclerosis and the location in the midsegment.  Given that her blood pressure is now well controlled, no indication for renal artery revascularization.  If anything, blood pressure has been running low since addition of amlodipine and I instructed her to take 2.5 mg daily instead of 5 if her blood pressure is running low.  She monitors her blood pressure daily anyway.   2. Blurred vision: Could be due to fluctuation in blood pressure.  Recent carotid Doppler was unremarkable.  3. Likely bicuspid aortic valve with ascending aortic aneurysm. Most recent echo suggested tricuspid valve but I reviewed the images and likely has functional bicuspid valve. TEE might be helpful if there is a question about this as it might affect the threshold for repairing the ascending aortic aneurysm.  4. Ascending aortic aneurysm. Most recently this measured 48 x 49 mm. Indication for repair if this reaches 50 mm in the presence of bicuspid aortic valve.  COVID-19 Education: The signs and symptoms of COVID-19 were discussed with the patient and how to seek care for testing (follow up with PCP or arrange E-visit).  The importance of social distancing was discussed today.  Time:   Today, I have spent 10 minutes with the patient with telehealth technology discussing the above problems.     Medication Adjustments/Labs and Tests Ordered: Current medicines are reviewed at length with the patient today.  Concerns regarding medicines are outlined above.   Tests Ordered: No orders of the defined types were placed in this encounter.   Medication Changes: No orders of the defined types were placed in this encounter.   Follow Up:  In Person in 6  month(s)  Signed, Lorine Bears, MD  02/01/2020 3:43 PM    [facility]

## 2020-02-01 NOTE — Patient Instructions (Signed)
Medication Instructions:  Your physician recommends that you continue on your current medications as directed. Please refer to the Current Medication list given to you today.  *If you need a refill on your cardiac medications before your next appointment, please call your pharmacy*   Lab Work: None ordered If you have labs (blood work) drawn today and your tests are completely normal, you will receive your results only by: . MyChart Message (if you have MyChart) OR . A paper copy in the mail If you have any lab test that is abnormal or we need to change your treatment, we will call you to review the results.   Testing/Procedures: None ordered   Follow-Up: At [facility], you and your health needs are our priority.  As part of our continuing mission to provide you with exceptional heart care, we have created designated Provider Care Teams.  These Care Teams include your primary Cardiologist (physician) and Advanced Practice Providers (APPs -  Physician Assistants and Nurse Practitioners) who all work together to provide you with the care you need, when you need it.  We recommend signing up for the patient portal called "MyChart".  Sign up information is provided on this After Visit Summary.  MyChart is used to connect with patients for Virtual Visits (Telemedicine).  Patients are able to view lab/test results, encounter notes, upcoming appointments, etc.  Non-urgent messages can be sent to your provider as well.   To learn more about what you can do with MyChart, go to [email or web address].    Your next appointment:   6 month(s)  The format for your next appointment:   In Person  Provider:   You may see Dr. Arida or one of the following Advanced Practice Providers on your designated Care Team:    Christopher Berge, NP  Ryan Dunn, PA-C  Jacquelyn Visser, PA-C  Cadence Furth, PA-C    Other Instructions N/A  

## 2020-02-01 NOTE — Telephone Encounter (Signed)
  Patient Consent for Virtual Visit         Courtney Lopez has provided verbal consent on 02/01/2020 for a virtual visit (video or telephone).   CONSENT FOR VIRTUAL VISIT FOR:  Courtney Lopez  By participating in this virtual visit I agree to the following:  I hereby voluntarily request, consent and authorize [facility] and its employed or contracted physicians, Producer, television/film/video, nurse practitioners or other licensed health care professionals (the Practitioner), to provide me with telemedicine health care services (the "Services") as deemed necessary by the treating Practitioner. I acknowledge and consent to receive the Services by the Practitioner via telemedicine. I understand that the telemedicine visit will involve communicating with the Practitioner through live audiovisual communication technology and the disclosure of certain medical information by electronic transmission. I acknowledge that I have been given the opportunity to request an in-person assessment or other available alternative prior to the telemedicine visit and am voluntarily participating in the telemedicine visit.  I understand that I have the right to withhold or withdraw my consent to the use of telemedicine in the course of my care at any time, without affecting my right to future care or treatment, and that the Practitioner or I may terminate the telemedicine visit at any time. I understand that I have the right to inspect all information obtained and/or recorded in the course of the telemedicine visit and may receive copies of available information for a reasonable fee.  I understand that some of the potential risks of receiving the Services via telemedicine include:  [name] Delay or interruption in medical evaluation due to technological equipment failure or disruption; . Information transmitted may not be sufficient (e.g. poor resolution of images) to allow for appropriate medical decision making by the  Practitioner; and/or  . In rare instances, security protocols could fail, causing a breach of personal health information.  Furthermore, I acknowledge that it is my responsibility to provide information about my medical history, conditions and care that is complete and accurate to the best of my ability. I acknowledge that Practitioner's advice, recommendations, and/or decision may be based on factors not within their control, such as incomplete or inaccurate data provided by me or distortions of diagnostic images or specimens that may result from electronic transmissions. I understand that the practice of medicine is not an exact science and that Practitioner makes no warranties or guarantees regarding treatment outcomes. I acknowledge that a copy of this consent can be made available to me via my patient portal [facility] MyChart), or I can request a printed copy by calling the office of [facility].    I understand that my insurance will be billed for this visit.   I have read or had this consent read to me. . I understand the contents of this consent, which adequately explains the benefits and risks of the Services being provided via telemedicine.  . I have been provided ample opportunity to ask questions regarding this consent and the Services and have had my questions answered to my satisfaction. . I give my informed consent for the services to be provided through the use of telemedicine in my medical care

## 2020-02-22 DIAGNOSIS — J320 Chronic maxillary sinusitis: Secondary | ICD-10-CM | POA: Diagnosis not present

## 2020-02-24 ENCOUNTER — Encounter: Payer: Self-pay | Admitting: Otolaryngology

## 2020-02-24 ENCOUNTER — Other Ambulatory Visit: Payer: Self-pay

## 2020-02-28 NOTE — Discharge Instructions (Signed)
[facility] ENDOSCOPIC SINUS SURGERY [facility]  What is Functional Endoscopic Sinus Surgery?  The Surgery involves making the natural openings of the sinuses larger by removing the bony partitions that separate the sinuses from the nasal cavity.  The natural sinus lining is preserved as much as possible to allow the sinuses to resume normal function after the surgery.  In some patients nasal polyps (excessively swollen lining of the sinuses) may be removed to relieve obstruction of the sinus openings.  The surgery is performed through the nose using lighted scopes, which eliminates the need for incisions on the face.  A septoplasty is a different procedure which is sometimes performed with sinus surgery.  It involves straightening the boy partition that separates the two sides of your nose.  A crooked or deviated septum may need repair if is obstructing the sinuses or nasal airflow.  Turbinate reduction is also often performed during sinus surgery.  The turbinates are bony proturberances from the side walls of the nose which swell and can obstruct the nose in patients with sinus and allergy problems.  Their size can be surgically reduced to help relieve nasal obstruction.  What Can Sinus Surgery Do For Me?  Sinus surgery can reduce the frequency of sinus infections requiring antibiotic treatment.  This can provide improvement in nasal congestion, post-nasal drainage, facial pressure and nasal obstruction.  Surgery will NOT prevent you from ever having an infection again, so it usually only for patients who get infections 4 or more times yearly requiring antibiotics, or for infections that do not clear with antibiotics.  It will not cure nasal allergies, so patients with allergies may still require medication to treat their allergies after surgery. Surgery may improve headaches related to sinusitis, however, some people will continue to  require medication to control sinus headaches related to allergies.  Surgery will do nothing for other forms of headache (migraine, tension or cluster).  What Are the Risks of Endoscopic Sinus Surgery?  Current techniques allow surgery to be performed safely with little risk, however, there are rare complications that patients should be aware of.  Because the sinuses are located around the eyes, there is risk of eye injury, including blindness, though again, this would be quite rare. This is usually a result of bleeding behind the eye during surgery, which puts the vision oat risk, though there are treatments to protect the vision and prevent permanent disrupted by surgery causing a leak of the spinal fluid that surrounds the brain.  More serious complications would include bleeding inside the brain cavity or damage to the brain.  Again, all of these complications are uncommon, and spinal fluid leaks can be safely managed surgically if they occur.  The most common complication of sinus surgery is bleeding from the nose, which may require packing or cauterization of the nose.  Continued sinus have polyps may experience recurrence of the polyps requiring revision surgery.  Alterations of sense of smell or injury to the tear ducts are also rare complications.   What is the Surgery Like, and what is the Recovery?  The Surgery usually takes a couple of hours to perform, and is usually performed under a general anesthetic (completely asleep).  Patients are usually discharged home after a couple of hours.  Sometimes during surgery it is necessary to pack the nose to control bleeding, and the packing is left in place for 24 - 48 hours, and removed by your surgeon.    If a septoplasty was performed during the procedure, there is often a splint placed which must be removed after 5-7 days.   Discomfort: Pain is usually mild to moderate, and can be controlled by prescription pain medication or acetaminophen (Tylenol).   Aspirin, Ibuprofen (Advil, Motrin), or Naprosyn (Aleve) should be avoided, as they can cause increased bleeding.  Most patients feel sinus pressure like they have a bad head cold for several days.  Sleeping with your head elevated can help reduce swelling and facial pressure, as can ice packs over the face.  A humidifier may be helpful to keep the mucous and blood from drying in the nose.   Diet: There are no specific diet restrictions, however, you should generally start with clear liquids and a light diet of bland foods because the anesthetic can cause some nausea.  Advance your diet depending on how your stomach feels.  Taking your pain medication with food will often help reduce stomach upset which pain medications can cause.  Nasal Saline Irrigation: It is important to remove blood clots and dried mucous from the nose as it is healing.  This is done by having you irrigate the nose at least 3 - 4 times daily with a salt water solution.  We recommend using NeilMed Sinus Rinse (available at the drug store).  Fill the squeeze bottle with the solution, bend over a sink, and insert the tip of the squeeze bottle into the nose  of an inch.  Point the tip of the squeeze bottle towards the inside corner of the eye on the same side your irrigating.  Squeeze the bottle and gently irrigate the nose.  If you bend forward as you do this, most of the fluid will flow back out of the nose, instead of down your throat.   The solution should be warm, near body temperature, when you irrigate.   Each time you irrigate, you should use a full squeeze bottle.   Note that if you are instructed to use Nasal Steroid Sprays at any time after your surgery, irrigate with saline BEFORE using the steroid spray, so you do not wash it all out of the nose. Another product, Nasal Saline Gel (such as AYR Nasal Saline Gel) can be applied in each nostril 3 - 4 times daily to moisture the nose and reduce scabbing or crusting.  Bleeding:   Bloody drainage from the nose can be expected for several days, and patients are instructed to irrigate their nose frequently with salt water to help remove mucous and blood clots.  The drainage may be dark red or brown, though some fresh blood may be seen intermittently, especially after irrigation.  Do not blow you nose, as bleeding may occur. If you must sneeze, keep your mouth open to allow air to escape through your mouth.  If heavy bleeding occurs: Irrigate the nose with saline to rinse out clots, then spray the nose 3 - 4 times with Afrin Nasal Decongestant Spray.  The spray will constrict the blood vessels to slow bleeding.  Pinch the lower half of your nose shut to apply pressure, and lay down with your head elevated.  Ice packs over the nose may help as well. If bleeding persists despite these measures, you should notify your doctor.  Do not use the Afrin routinely to control nasal congestion after surgery, as it can result in worsening congestion and may affect healing.     Activity: Return to work varies among patients. Most patients will be   out of work at least 5 - 7 days to recover.  Patient may return to work after they are off of narcotic pain medication, and feeling well enough to perform the functions of their job.  Patients must avoid heavy lifting (over 10 pounds) or strenuous physical for 2 weeks after surgery, so your employer may need to assign you to light duty, or keep you out of work longer if light duty is not possible.  NOTE: you should not drive, operate dangerous machinery, do any mentally demanding tasks or make any important legal or financial decisions while on narcotic pain medication and recovering from the general anesthetic.    Call Your Doctor Immediately if You Have Any of the Following: 1. Bleeding that you cannot control with the above measures 2. Loss of vision, double vision, bulging of the eye or black eyes. 3. Fever over 101 degrees 4. Neck stiffness with  severe headache, fever, nausea and change in mental state. You are always encourage to call anytime with concerns, however, please call with requests for pain medication refills during office hours.  Office Endoscopy: During follow-up visits your doctor will remove any packing or splints that may have been placed and evaluate and clean your sinuses endoscopically.  Topical anesthetic will be used to make this as comfortable as possible, though you may want to take your pain medication prior to the visit.  How often this will need to be done varies from patient to patient.  After complete recovery from the surgery, you may need follow-up endoscopy from time to time, particularly if there is concern of recurrent infection or nasal polyps.  General Anesthesia, Adult, Care After This sheet gives you information about how to care for yourself after your procedure. Your health care provider may also give you more specific instructions. If you have problems or questions, contact your health care provider. What can I expect after the procedure? After the procedure, the following side effects are common:  Pain or discomfort at the IV site.  Nausea.  Vomiting.  Sore throat.  Trouble concentrating.  Feeling cold or chills.  Weak or tired.  Sleepiness and fatigue.  Soreness and body aches. These side effects can affect parts of the body that were not involved in surgery. Follow these instructions at home:  For at least 24 hours after the procedure:  Have a responsible adult stay with you. It is important to have someone help care for you until you are awake and alert.  Rest as needed.  Do not: ? Participate in activities in which you could fall or become injured. ? Drive. ? Use heavy machinery. ? Drink alcohol. ? Take sleeping pills or medicines that cause drowsiness. ? Make important decisions or sign legal documents. ? Take care of children on your own. Eating and drinking  Follow  any instructions from your health care provider about eating or drinking restrictions.  When you feel hungry, start by eating small amounts of foods that are soft and easy to digest (bland), such as toast. Gradually return to your regular diet.  Drink enough fluid to keep your urine pale yellow.  If you vomit, rehydrate by drinking water, juice, or clear broth. General instructions  If you have sleep apnea, surgery and certain medicines can increase your risk for breathing problems. Follow instructions from your health care provider about wearing your sleep device: ? Anytime you are sleeping, including during daytime naps. ? While taking prescription pain medicines, sleeping medicines, or medicines that   make you drowsy.  Return to your normal activities as told by your health care provider. Ask your health care provider what activities are safe for you.  Take over-the-counter and prescription medicines only as told by your health care provider.  If you smoke, do not smoke without supervision.  Keep all follow-up visits as told by your health care provider. This is important. Contact a health care provider if:  You have nausea or vomiting that does not get better with medicine.  You cannot eat or drink without vomiting.  You have pain that does not get better with medicine.  You are unable to pass urine.  You develop a skin rash.  You have a fever.  You have redness around your IV site that gets worse. Get help right away if:  You have difficulty breathing.  You have chest pain.  You have blood in your urine or stool, or you vomit blood. Summary  After the procedure, it is common to have a sore throat or nausea. It is also common to feel tired.  Have a responsible adult stay with you for the first 24 hours after general anesthesia. It is important to have someone help care for you until you are awake and alert.  When you feel hungry, start by eating small amounts of  foods that are soft and easy to digest (bland), such as toast. Gradually return to your regular diet.  Drink enough fluid to keep your urine pale yellow.  Return to your normal activities as told by your health care provider. Ask your health care provider what activities are safe for you. This information is not intended to replace advice given to you by your health care provider. Make sure you discuss any questions you have with your health care provider. Document Revised: 05/02/2017 Document Reviewed: 12/13/2016 Elsevier Patient Education  2020 [employer]  

## 2020-02-29 ENCOUNTER — Other Ambulatory Visit: Payer: Self-pay

## 2020-02-29 ENCOUNTER — Other Ambulatory Visit
Admission: RE | Admit: 2020-02-29 | Discharge: 2020-02-29 | Disposition: A | Discharge status: Home / Self Care | Payer: 59 | Source: Ambulatory Visit | Attending: Otolaryngology | Admitting: Otolaryngology

## 2020-02-29 DIAGNOSIS — Z20822 Contact with and (suspected) exposure to covid-19: Secondary | ICD-10-CM | POA: Insufficient documentation

## 2020-02-29 DIAGNOSIS — Z01812 Encounter for preprocedural laboratory examination: Secondary | ICD-10-CM | POA: Insufficient documentation

## 2020-03-01 LAB — SARS CORONAVIRUS 2 (TAT 6-24 HRS): SARS Coronavirus 2: NEGATIVE

## 2020-03-02 ENCOUNTER — Ambulatory Visit: Payer: 59 | Admitting: Anesthesiology

## 2020-03-02 ENCOUNTER — Other Ambulatory Visit: Payer: Self-pay | Admitting: Otolaryngology

## 2020-03-02 ENCOUNTER — Ambulatory Visit
Admission: RE | Admit: 2020-03-02 | Discharge: 2020-03-02 | Disposition: A | Discharge status: Home / Self Care | Payer: 59 | Attending: Otolaryngology | Admitting: Otolaryngology

## 2020-03-02 ENCOUNTER — Other Ambulatory Visit: Payer: Self-pay

## 2020-03-02 ENCOUNTER — Encounter
Admission: RE | Disposition: A | Discharge status: Home / Self Care | Payer: Self-pay | Source: Home / Self Care | Attending: Otolaryngology

## 2020-03-02 ENCOUNTER — Encounter: Payer: Self-pay | Admitting: Otolaryngology

## 2020-03-02 DIAGNOSIS — J322 Chronic ethmoidal sinusitis: Secondary | ICD-10-CM | POA: Diagnosis not present

## 2020-03-02 DIAGNOSIS — Z79899 Other long term (current) drug therapy: Secondary | ICD-10-CM | POA: Insufficient documentation

## 2020-03-02 DIAGNOSIS — I10 Essential (primary) hypertension: Secondary | ICD-10-CM | POA: Insufficient documentation

## 2020-03-02 DIAGNOSIS — E119 Type 2 diabetes mellitus without complications: Secondary | ICD-10-CM | POA: Diagnosis not present

## 2020-03-02 DIAGNOSIS — E785 Hyperlipidemia, unspecified: Secondary | ICD-10-CM | POA: Insufficient documentation

## 2020-03-02 DIAGNOSIS — J320 Chronic maxillary sinusitis: Secondary | ICD-10-CM | POA: Insufficient documentation

## 2020-03-02 DIAGNOSIS — F419 Anxiety disorder, unspecified: Secondary | ICD-10-CM | POA: Diagnosis not present

## 2020-03-02 HISTORY — DX: Presence of spectacles and contact lenses: Z97.3

## 2020-03-02 HISTORY — DX: Thoracic aortic aneurysm, without rupture: I71.2

## 2020-03-02 HISTORY — PX: IMAGE GUIDED SINUS SURGERY: SHX6570

## 2020-03-02 HISTORY — DX: Atherosclerosis of renal artery: I70.1

## 2020-03-02 HISTORY — DX: Arterial fibromuscular dysplasia: I77.3

## 2020-03-02 HISTORY — DX: Nontoxic multinodular goiter: E04.2

## 2020-03-02 HISTORY — DX: Aneurysm of the ascending aorta, without rupture: I71.21

## 2020-03-02 HISTORY — PX: MAXILLARY ANTROSTOMY: SHX2003

## 2020-03-02 HISTORY — PX: ETHMOIDECTOMY: SHX5197

## 2020-03-02 LAB — GLUCOSE, CAPILLARY: Glucose-Capillary: 137 mg/dL — ABNORMAL HIGH (ref 70–99)

## 2020-03-02 SURGERY — SINUS SURGERY, WITH IMAGING GUIDANCE
Anesthesia: General | Site: Nose

## 2020-03-02 MED ORDER — PREDNISONE 10 MG PO TABS: ORAL_TABLET | ORAL | 0 refills | Status: DC

## 2020-03-02 MED ORDER — EPHEDRINE SULFATE 50 MG/ML IJ SOLN
INTRAMUSCULAR | Status: DC | PRN
  Administered 2020-03-02: 5 mg via INTRAVENOUS
  Administered 2020-03-02: 10 mg via INTRAVENOUS

## 2020-03-02 MED ORDER — LEVOFLOXACIN 500 MG PO TABS: 500.0000 mg | ORAL_TABLET | Freq: Every day | ORAL | 0 refills | Status: DC

## 2020-03-02 MED ORDER — OXYCODONE HCL 5 MG PO TABS: 5.0000 mg | ORAL_TABLET | Freq: Once | ORAL | Status: DC

## 2020-03-02 MED ORDER — OXYMETAZOLINE HCL 0.05 % NA SOLN
2.0000 | Freq: Once | NASAL | Status: AC
  Administered 2020-03-02: 2 via NASAL

## 2020-03-02 MED ORDER — OXYCODONE HCL 5 MG/5ML PO SOLN
5.0000 mg | Freq: Once | ORAL | Status: AC
  Administered 2020-03-02: 5 mg via ORAL

## 2020-03-02 MED ORDER — SODIUM CHLORIDE 0.9 % IV SOLN
INTRAVENOUS | Status: DC | PRN
  Administered 2020-03-02: 40 ug/min via INTRAVENOUS

## 2020-03-02 MED ORDER — MIDAZOLAM HCL 5 MG/5ML IJ SOLN
INTRAMUSCULAR | Status: DC | PRN
  Administered 2020-03-02: 2 mg via INTRAVENOUS

## 2020-03-02 MED ORDER — LABETALOL HCL 5 MG/ML IV SOLN
10.0000 mg | Freq: Once | INTRAVENOUS | Status: AC
  Administered 2020-03-02: 10 mg via INTRAVENOUS

## 2020-03-02 MED ORDER — LIDOCAINE HCL (CARDIAC) PF 100 MG/5ML IV SOSY
PREFILLED_SYRINGE | INTRAVENOUS | Status: DC | PRN
  Administered 2020-03-02: 50 mg via INTRAVENOUS

## 2020-03-02 MED ORDER — LIDOCAINE-EPINEPHRINE 1 %-1:100000 IJ SOLN
INTRAMUSCULAR | Status: DC | PRN
  Administered 2020-03-02: 3 mL

## 2020-03-02 MED ORDER — DEXAMETHASONE SODIUM PHOSPHATE 4 MG/ML IJ SOLN
INTRAMUSCULAR | Status: DC | PRN
  Administered 2020-03-02: 10 mg via INTRAVENOUS

## 2020-03-02 MED ORDER — FLUCONAZOLE 100 MG PO TABS: ORAL_TABLET | ORAL | 0 refills | Status: DC

## 2020-03-02 MED ORDER — PROPOFOL 10 MG/ML IV BOLUS
INTRAVENOUS | Status: DC | PRN
  Administered 2020-03-02: 130 mg via INTRAVENOUS

## 2020-03-02 MED ORDER — FENTANYL CITRATE (PF) 100 MCG/2ML IJ SOLN
INTRAMUSCULAR | Status: DC | PRN
Start: 2020-03-02 — End: 2020-03-02
  Administered 2020-03-02: 50 ug via INTRAVENOUS

## 2020-03-02 MED ORDER — SUCCINYLCHOLINE CHLORIDE 20 MG/ML IJ SOLN
INTRAMUSCULAR | Status: DC | PRN
  Administered 2020-03-02: 100 mg via INTRAVENOUS

## 2020-03-02 MED ORDER — PHENYLEPHRINE HCL 0.5 % NA SOLN
NASAL | Status: DC | PRN
  Administered 2020-03-02: 15 mL via TOPICAL

## 2020-03-02 MED ORDER — HYDROCODONE-ACETAMINOPHEN 5-325 MG PO TABS
1.0000 | ORAL_TABLET | ORAL | 0 refills | Status: DC | PRN
Start: 2020-03-02 — End: 2020-03-02

## 2020-03-02 MED ORDER — PHENYLEPHRINE HCL (PRESSORS) 10 MG/ML IV SOLN
INTRAVENOUS | Status: DC | PRN
  Administered 2020-03-02: 50 ug via INTRAVENOUS
  Administered 2020-03-02 (×3): 100 ug via INTRAVENOUS

## 2020-03-02 MED ORDER — DEXTROSE 5 % IV SOLN
2000.0000 mg | Freq: Once | INTRAVENOUS | Status: AC
  Administered 2020-03-02: 2000 mg via INTRAVENOUS

## 2020-03-02 MED ORDER — FENTANYL CITRATE (PF) 100 MCG/2ML IJ SOLN
25.0000 ug | INTRAMUSCULAR | Status: DC | PRN
  Administered 2020-03-02 (×2): 25 ug via INTRAVENOUS

## 2020-03-02 MED ORDER — GLYCOPYRROLATE 0.2 MG/ML IJ SOLN
INTRAMUSCULAR | Status: DC | PRN
  Administered 2020-03-02: .1 mg via INTRAVENOUS

## 2020-03-02 MED ORDER — LACTATED RINGERS IV SOLN: INTRAVENOUS | Status: DC

## 2020-03-02 SURGICAL SUPPLY — 30 items
BLADE SHAVER DIEGO 4 40D (BLADE) ×1 IMPLANT
CATH IV 18X1 1/4 SAFELET (CATHETERS) ×3 IMPLANT
COAGULATOR SUCT 8FR VV (MISCELLANEOUS) ×3 IMPLANT
ELECT REM PT RETURN 9FT ADLT (ELECTROSURGICAL) ×3
ELECTRODE REM PT RTRN 9FT ADLT (ELECTROSURGICAL) ×2 IMPLANT
GLOVE PI ULTRA LF STRL 7.5 (GLOVE) ×4 IMPLANT
GLOVE PI ULTRA NON LATEX 7.5 (GLOVE) ×2
GOWN STRL REUS W/ TWL LRG LVL3 (GOWN DISPOSABLE) ×2 IMPLANT
GOWN STRL REUS W/TWL LRG LVL3 (GOWN DISPOSABLE) ×3
IV CATH 18X1 1/4 SAFELET (CATHETERS) ×2
KIT TURNOVER KIT A (KITS) ×3 IMPLANT
MANIFOLD NEPTUNE II (INSTRUMENTS) ×1 IMPLANT
NDL ANESTHESIA 27G X 3.5 (NEEDLE) ×2 IMPLANT
NDL HYPO 27GX1-1/4 (NEEDLE) ×2 IMPLANT
NEEDLE ANESTHESIA  27G X 3.5 (NEEDLE) ×1
NEEDLE ANESTHESIA 27G X 3.5 (NEEDLE) ×2 IMPLANT
NEEDLE HYPO 27GX1-1/4 (NEEDLE) ×3 IMPLANT
NS IRRIG 500ML POUR BTL (IV SOLUTION) ×3 IMPLANT
PACK ENT CUSTOM (PACKS) ×3 IMPLANT
PACKING NASAL EPIS 4X2.4 XEROG (MISCELLANEOUS) ×6 IMPLANT
PATTIES SURGICAL .5 X3 (DISPOSABLE) ×3 IMPLANT
SHAVER DIEGO BLD STD TYPE A (BLADE) ×1 IMPLANT
SOL ANTI-FOG 6CC FOG-OUT (MISCELLANEOUS) ×2 IMPLANT
SOL FOG-OUT ANTI-FOG 6CC (MISCELLANEOUS) ×1
STRAP BODY AND KNEE 60X3 (MISCELLANEOUS) ×3 IMPLANT
SYR 3ML LL SCALE MARK (SYRINGE) ×3 IMPLANT
TOWEL OR 17X26 4PK STRL BLUE (TOWEL DISPOSABLE) ×3 IMPLANT
TRACKER CRANIALMASK (MASK) ×3 IMPLANT
TUBING DECLOG MULTIDEBRIDER (TUBING) ×1 IMPLANT
WATER STERILE IRR 250ML POUR (IV SOLUTION) ×3 IMPLANT

## 2020-03-02 NOTE — Op Note (Signed)
03/02/2020  9:26 AM    Courtney Lopez  [id number]   Pre-Op Dx: Chronic left maxillary sinusitis, chronic left ethmoid sinusitis  Post-op Dx: Chronic left maxillary sinusitis with fungus ball, chronic left ethmoid sinusitis  Proc: Left endoscopic maxillary antrostomy with removal of contents, left endoscopic total ethmoidectomy, use of image guided system  Surg:  Courtney Sessions Emara Lichter  Anes:  GOT  EBL: 200 mL  Comp: None  Findings: Left maxillary sinus completely filled with creamy white mucus and chunks of fungus ball.  There is very inflamed mucous membranes in the sinus there were hiding pockets of pus.  The left ethmoid had thickened mucous membranes throughout.  Procedure: The patient was given general anesthesia by oral endotracheal intubation.  Once she was fully asleep the nose was prepped with cotton pledgets soaked in Afrin and Xylocaine mixture.  The image guided system was brought in and the CT scan was downloaded to the disc.  The face was then registered to the system and the instruments were registered to the system.  The suction instruments were then checked and showed good alignment with the system.  She was prepped and draped in a sterile fashion.  A cotton pledgets were removed and the 0 degree scope was then used for visualizing the nose.  She had had previous septoplasty with some septal perforation inferiorly.  The anterior cartilage bowed slightly to the left side making this airway slightly narrower than the right side.  The middle turbinate appeared to be normal and the uncinate process was bulging into the middle meatus and filling this.  Local anesthesia with 1% Xylocaine and epi 1: 100,000 was used for infiltration of the uncinate process and the middle turbinate.  Pushing on the uncinate process there was thick white creamy pus that was being pushed out of the middle meatus.  This was cultured and sent for aerobic bacteria.  Using the 0 degree scope the middle  turbinate was infractured to show the middle meatus better.  A side biter was used for incising the uncinate process.  There is lots of creamy pus and evidence of fungus ball that was pushing out of the maxillary sinus.  This was suctioned and cleared away.  The uncinate process was removed using a 45 degree up-biting through biting forcep.  This showed the opening to the maxillary antrum which had severe inflammation and granulation all the way around it.  The microdebrider was used for cleaning up some of this and opening up the maxillary sinus more.  The natural ostium was included here.  There was pus in different pockets in the maxillary sinus that would break into and clean a little more out.  I used curettes to help loosen up some of the mucus and get this out.  There were were pockets of inflamed tissue and then pus hiding behind them.  All this broken up so that the maxillary sinus was clear using the 30 degree scope.  A good maxillary antrum was created and widened to make sure that this could be easily cleaned.  Using the 0 degree scope and the image guided system the posterior ethmoid air cells were opened and cleaned out.  The microdebrider was used to clean up the rough edges and the excessive inflamed mucosa while leaving a layer of mucosa on the bony surface.  Once the posterior ethmoid air cells were open some of the middle air cells were opened and the 30 degree scope was used for this.  The  anterior ethmoid air cells were then opened and the opening the frontal sinus was found.  Using the frontal sinus through biting forceps this was widened to create a good opening into the frontal sinus.  I could place the image guided curved suction right up into the frontal sinus easily and the mucosal membrane here was not inflamed at all.  The mucous membranes in the posterior middle ethmoid air cells were quite inflamed but there is no pus in the ethmoid sinus.  With the 0 and 30 degrees scopes the left  maxillary sinus and left ethmoid sinus were now completely opened with a good opening into the frontal sinus duct.  Underneath the inferior turbinate there was some granulation type tissue at the posterior floor of the nose that was trimmed off.  There is a previous septal perforation inferiorly along where the maxillary crest was posteriorly on the septum.  This remained.  The inferior turbinate was outfractured to create a little more room here.  Xerogel was then placed into the ethmoid sinus and opening to the maxillary sinus to help control bleeding and keep the middle turbinate medialized.  The right airway was completely opened and the left airway was opened inferiorly.  The patient tolerated procedure well.  She was awakened taken to the recovery room in satisfactory condition.  There were no operative complications.  Dispo:   To PACU to be discharged home  Plan: To follow-up in the office in 5 to 6 days to reevaluate the nose.  She will flush with saline flushes about 5 times a day on her left side to help wash away of the xerogel.  I have given her some Vicodin to use for pain and she also has a prednisone taper and some Levaquin to use postoperatively.  I gave her a couple Diflucan because she is starting to get some yeast overgrowth because of the antibiotic she is used.  She will drink lots of liquids and rest at home with her head elevated.  Detailed postop structures will be given to her.  Courtney Lopez  03/02/2020 9:26 AM Better

## 2020-03-02 NOTE — Anesthesia Preprocedure Evaluation (Addendum)
Anesthesia Evaluation  Patient identified by MRN, date of birth, ID band Patient awake    Reviewed: Allergy & Precautions, NPO status , Patient's Chart, lab work & pertinent test results  Airway Mallampati: II  TM Distance: >3 FB Neck ROM: Full    Dental no notable dental hx.    Pulmonary neg pulmonary ROS,    Pulmonary exam normal        Cardiovascular hypertension, + Peripheral Vascular Disease  Normal cardiovascular exam+ Valvular Problems/Murmurs (Moderate AS, AVA 1.09 cm2) AS   12/2019 ECHO 1. Left ventricular ejection fraction, by estimation, is 60 to 65%. The  left ventricle has normal function. The left ventricle has no regional wall motion abnormalities. There is mild left ventricular hypertrophy. Left ventricular diastolic parameters are consistent with Grade I diastolic dysfunction (impaired relaxation).  2. Right ventricular systolic function is normal. The right ventricular size is normal. There is normal pulmonary artery systolic pressure.  3. The mitral valve is normal in structure. Trivial mitral valve regurgitation.  4. The aortic valve is tricuspid. Aortic valve regurgitation is mild. Moderate aortic valve stenosis. Aortic valve mean gradient measures 22.5 mmHg.  5. Aortic dilatation noted. There is moderate to severe dilatation of the ascending aorta measuring 48 mm.  6. The inferior vena cava is normal in size with greater than 50% respiratory variability, suggesting right atrial pressure of 3 mmHg.    Neuro/Psych PSYCHIATRIC DISORDERS Anxiety negative neurological ROS     GI/Hepatic negative GI ROS, Neg liver ROS,   Endo/Other  diabetes, Type 2  Renal/GU Renal hypertensionRenal disease     Musculoskeletal negative musculoskeletal ROS (+)   Abdominal Normal abdominal exam  (+)   Peds  Hematology negative hematology ROS (+)   Anesthesia Other Findings   Reproductive/Obstetrics                            Anesthesia Physical Anesthesia Plan  ASA: III  Anesthesia Plan: General   Post-op Pain Management:    Induction: Intravenous  PONV Risk Score and Plan: 3 and Ondansetron, Dexamethasone, Midazolam, Scopolamine patch - Pre-op and Treatment may vary due to age or medical condition  Airway Management Planned: Oral ETT  Additional Equipment: None  Intra-op Plan:   Post-operative Plan: Extubation in OR  Informed Consent: I have reviewed the patients History and Physical, chart, labs and discussed the procedure including the risks, benefits and alternatives for the proposed anesthesia with the patient or authorized representative who has indicated his/her understanding and acceptance.     Dental advisory given  Plan Discussed with: CRNA  Anesthesia Plan Comments:       Anesthesia Quick Evaluation

## 2020-03-02 NOTE — Transfer of Care (Signed)
Immediate Anesthesia Transfer of Care Note  Patient: Courtney Lopez  Procedure(s) Performed: IMAGE GUIDED SINUS SURGERY (N/A Nose) ETHMOIDECTOMY (Left Nose) MAXILLARY ANTROSTOMY (Left Nose)  Patient Location: PACU  Anesthesia Type: General  Level of Consciousness: awake, alert  and patient cooperative  Airway and Oxygen Therapy: Patient Spontanous Breathing and Patient connected to supplemental oxygen  Post-op Assessment: Post-op Vital signs reviewed, Patient's Cardiovascular Status Stable, Respiratory Function Stable, Patent Airway and No signs of Nausea or vomiting  Post-op Vital Signs: Reviewed and stable  Complications: No complications documented.

## 2020-03-02 NOTE — H&P (Signed)
H&P has been reviewed and patient reevaluated, no changes necessary. To be downloaded later.  

## 2020-03-02 NOTE — Addendum Note (Signed)
Addendum  created 03/02/20 1034 by Fletcher Anon, MD   Review and Sign - Ready for Procedure

## 2020-03-02 NOTE — Anesthesia Procedure Notes (Signed)
Procedure Name: Intubation Date/Time: 03/02/2020 7:38 AM Performed by: Jimmy Picket, CRNA Pre-anesthesia Checklist: Patient identified, Emergency Drugs available, Suction available, Patient being monitored and Timeout performed Patient Re-evaluated:Patient Re-evaluated prior to induction Oxygen Delivery Method: Circle system utilized Preoxygenation: Pre-oxygenation with 100% oxygen Induction Type: IV induction Ventilation: Mask ventilation without difficulty Laryngoscope Size: Miller and 2 Grade View: Grade I Tube type: Oral Rae Tube size: 7.0 mm Number of attempts: 1 Placement Confirmation: ETT inserted through vocal cords under direct vision,  positive ETCO2 and breath sounds checked- equal and bilateral Tube secured with: Tape Dental Injury: Teeth and Oropharynx as per pre-operative assessment  Comments: Neutral neck position maintained during DL.

## 2020-03-02 NOTE — Anesthesia Postprocedure Evaluation (Signed)
Anesthesia Post Note  Patient: Courtney Lopez  Procedure(s) Performed: IMAGE GUIDED SINUS SURGERY (N/A Nose) ETHMOIDECTOMY (Left Nose) MAXILLARY ANTROSTOMY (Left Nose)     Patient location during evaluation: PACU Anesthesia Type: General Pain management: pain level controlled Vital Signs Assessment: post-procedure vital signs reviewed and stable Respiratory status: spontaneous breathing and nonlabored ventilation Cardiovascular status: blood pressure returned to baseline Anesthetic complications: no   No complications documented.  Payten Hobin [employer]

## 2020-03-03 ENCOUNTER — Encounter: Payer: Self-pay | Admitting: Otolaryngology

## 2020-03-06 ENCOUNTER — Encounter: Payer: Self-pay | Admitting: Internal Medicine

## 2020-03-06 DIAGNOSIS — J329 Chronic sinusitis, unspecified: Secondary | ICD-10-CM | POA: Insufficient documentation

## 2020-03-06 LAB — SURGICAL PATHOLOGY

## 2020-03-07 DIAGNOSIS — Z48813 Encounter for surgical aftercare following surgery on the respiratory system: Secondary | ICD-10-CM | POA: Diagnosis not present

## 2020-03-07 LAB — AEROBIC/ANAEROBIC CULTURE W GRAM STAIN (SURGICAL/DEEP WOUND)

## 2020-03-14 DIAGNOSIS — Z48813 Encounter for surgical aftercare following surgery on the respiratory system: Secondary | ICD-10-CM | POA: Diagnosis not present

## 2020-03-27 ENCOUNTER — Other Ambulatory Visit: Payer: Self-pay | Admitting: Otolaryngology

## 2020-03-27 DIAGNOSIS — Z48813 Encounter for surgical aftercare following surgery on the respiratory system: Secondary | ICD-10-CM | POA: Diagnosis not present

## 2020-04-03 ENCOUNTER — Other Ambulatory Visit: Payer: Self-pay | Admitting: Internal Medicine

## 2020-04-04 ENCOUNTER — Encounter: Payer: Self-pay | Admitting: Otolaryngology

## 2020-04-10 DIAGNOSIS — J320 Chronic maxillary sinusitis: Secondary | ICD-10-CM | POA: Diagnosis not present

## 2020-04-11 ENCOUNTER — Other Ambulatory Visit
Admission: RE | Admit: 2020-04-11 | Discharge: 2020-04-11 | Disposition: A | Discharge status: Home / Self Care | Payer: 59 | Source: Ambulatory Visit | Attending: Otolaryngology | Admitting: Otolaryngology

## 2020-04-11 ENCOUNTER — Other Ambulatory Visit: Payer: Self-pay

## 2020-04-11 ENCOUNTER — Other Ambulatory Visit: Payer: Self-pay | Admitting: Orthopaedic Surgery

## 2020-04-11 DIAGNOSIS — M25572 Pain in left ankle and joints of left foot: Secondary | ICD-10-CM

## 2020-04-11 DIAGNOSIS — Z20822 Contact with and (suspected) exposure to covid-19: Secondary | ICD-10-CM | POA: Diagnosis not present

## 2020-04-11 DIAGNOSIS — Z01812 Encounter for preprocedural laboratory examination: Secondary | ICD-10-CM | POA: Diagnosis not present

## 2020-04-11 LAB — SARS CORONAVIRUS 2 (TAT 6-24 HRS): SARS Coronavirus 2: NEGATIVE

## 2020-04-11 NOTE — Discharge Instructions (Signed)
[facility] ENDOSCOPIC SINUS SURGERY [facility]  What is Functional Endoscopic Sinus Surgery?  The Surgery involves making the natural openings of the sinuses larger by removing the bony partitions that separate the sinuses from the nasal cavity.  The natural sinus lining is preserved as much as possible to allow the sinuses to resume normal function after the surgery.  In some patients nasal polyps (excessively swollen lining of the sinuses) may be removed to relieve obstruction of the sinus openings.  The surgery is performed through the nose using lighted scopes, which eliminates the need for incisions on the face.  A septoplasty is a different procedure which is sometimes performed with sinus surgery.  It involves straightening the boy partition that separates the two sides of your nose.  A crooked or deviated septum may need repair if is obstructing the sinuses or nasal airflow.  Turbinate reduction is also often performed during sinus surgery.  The turbinates are bony proturberances from the side walls of the nose which swell and can obstruct the nose in patients with sinus and allergy problems.  Their size can be surgically reduced to help relieve nasal obstruction.  What Can Sinus Surgery Do For Me?  Sinus surgery can reduce the frequency of sinus infections requiring antibiotic treatment.  This can provide improvement in nasal congestion, post-nasal drainage, facial pressure and nasal obstruction.  Surgery will NOT prevent you from ever having an infection again, so it usually only for patients who get infections 4 or more times yearly requiring antibiotics, or for infections that do not clear with antibiotics.  It will not cure nasal allergies, so patients with allergies may still require medication to treat their allergies after surgery. Surgery may improve headaches related to sinusitis, however, some people will continue to  require medication to control sinus headaches related to allergies.  Surgery will do nothing for other forms of headache (migraine, tension or cluster).  What Are the Risks of Endoscopic Sinus Surgery?  Current techniques allow surgery to be performed safely with little risk, however, there are rare complications that patients should be aware of.  Because the sinuses are located around the eyes, there is risk of eye injury, including blindness, though again, this would be quite rare. This is usually a result of bleeding behind the eye during surgery, which puts the vision oat risk, though there are treatments to protect the vision and prevent permanent disrupted by surgery causing a leak of the spinal fluid that surrounds the brain.  More serious complications would include bleeding inside the brain cavity or damage to the brain.  Again, all of these complications are uncommon, and spinal fluid leaks can be safely managed surgically if they occur.  The most common complication of sinus surgery is bleeding from the nose, which may require packing or cauterization of the nose.  Continued sinus have polyps may experience recurrence of the polyps requiring revision surgery.  Alterations of sense of smell or injury to the tear ducts are also rare complications.   What is the Surgery Like, and what is the Recovery?  The Surgery usually takes a couple of hours to perform, and is usually performed under a general anesthetic (completely asleep).  Patients are usually discharged home after a couple of hours.  Sometimes during surgery it is necessary to pack the nose to control bleeding, and the packing is left in place for 24 - 48 hours, and removed by your surgeon.    If a septoplasty was performed during the procedure, there is often a splint placed which must be removed after 5-7 days.   Discomfort: Pain is usually mild to moderate, and can be controlled by prescription pain medication or acetaminophen (Tylenol).   Aspirin, Ibuprofen (Advil, Motrin), or Naprosyn (Aleve) should be avoided, as they can cause increased bleeding.  Most patients feel sinus pressure like they have a bad head cold for several days.  Sleeping with your head elevated can help reduce swelling and facial pressure, as can ice packs over the face.  A humidifier may be helpful to keep the mucous and blood from drying in the nose.   Diet: There are no specific diet restrictions, however, you should generally start with clear liquids and a light diet of bland foods because the anesthetic can cause some nausea.  Advance your diet depending on how your stomach feels.  Taking your pain medication with food will often help reduce stomach upset which pain medications can cause.  Nasal Saline Irrigation: It is important to remove blood clots and dried mucous from the nose as it is healing.  This is done by having you irrigate the nose at least 3 - 4 times daily with a salt water solution.  We recommend using NeilMed Sinus Rinse (available at the drug store).  Fill the squeeze bottle with the solution, bend over a sink, and insert the tip of the squeeze bottle into the nose  of an inch.  Point the tip of the squeeze bottle towards the inside corner of the eye on the same side your irrigating.  Squeeze the bottle and gently irrigate the nose.  If you bend forward as you do this, most of the fluid will flow back out of the nose, instead of down your throat.   The solution should be warm, near body temperature, when you irrigate.   Each time you irrigate, you should use a full squeeze bottle.   Note that if you are instructed to use Nasal Steroid Sprays at any time after your surgery, irrigate with saline BEFORE using the steroid spray, so you do not wash it all out of the nose. Another product, Nasal Saline Gel (such as AYR Nasal Saline Gel) can be applied in each nostril 3 - 4 times daily to moisture the nose and reduce scabbing or crusting.  Bleeding:   Bloody drainage from the nose can be expected for several days, and patients are instructed to irrigate their nose frequently with salt water to help remove mucous and blood clots.  The drainage may be dark red or brown, though some fresh blood may be seen intermittently, especially after irrigation.  Do not blow you nose, as bleeding may occur. If you must sneeze, keep your mouth open to allow air to escape through your mouth.  If heavy bleeding occurs: Irrigate the nose with saline to rinse out clots, then spray the nose 3 - 4 times with Afrin Nasal Decongestant Spray.  The spray will constrict the blood vessels to slow bleeding.  Pinch the lower half of your nose shut to apply pressure, and lay down with your head elevated.  Ice packs over the nose may help as well. If bleeding persists despite these measures, you should notify your doctor.  Do not use the Afrin routinely to control nasal congestion after surgery, as it can result in worsening congestion and may affect healing.     Activity: Return to work varies among patients. Most patients will be   out of work at least 5 - 7 days to recover.  Patient may return to work after they are off of narcotic pain medication, and feeling well enough to perform the functions of their job.  Patients must avoid heavy lifting (over 10 pounds) or strenuous physical for 2 weeks after surgery, so your employer may need to assign you to light duty, or keep you out of work longer if light duty is not possible.  NOTE: you should not drive, operate dangerous machinery, do any mentally demanding tasks or make any important legal or financial decisions while on narcotic pain medication and recovering from the general anesthetic.    Call Your Doctor Immediately if You Have Any of the Following: 1. Bleeding that you cannot control with the above measures 2. Loss of vision, double vision, bulging of the eye or black eyes. 3. Fever over 101 degrees 4. Neck stiffness with  severe headache, fever, nausea and change in mental state. You are always encourage to call anytime with concerns, however, please call with requests for pain medication refills during office hours.  Office Endoscopy: During follow-up visits your doctor will remove any packing or splints that may have been placed and evaluate and clean your sinuses endoscopically.  Topical anesthetic will be used to make this as comfortable as possible, though you may want to take your pain medication prior to the visit.  How often this will need to be done varies from patient to patient.  After complete recovery from the surgery, you may need follow-up endoscopy from time to time, particularly if there is concern of recurrent infection or nasal polyps.  General Anesthesia, Adult, Care After This sheet gives you information about how to care for yourself after your procedure. Your health care provider may also give you more specific instructions. If you have problems or questions, contact your health care provider. What can I expect after the procedure? After the procedure, the following side effects are common:  Pain or discomfort at the IV site.  Nausea.  Vomiting.  Sore throat.  Trouble concentrating.  Feeling cold or chills.  Weak or tired.  Sleepiness and fatigue.  Soreness and body aches. These side effects can affect parts of the body that were not involved in surgery. Follow these instructions at home:  For at least 24 hours after the procedure:  Have a responsible adult stay with you. It is important to have someone help care for you until you are awake and alert.  Rest as needed.  Do not: ? Participate in activities in which you could fall or become injured. ? Drive. ? Use heavy machinery. ? Drink alcohol. ? Take sleeping pills or medicines that cause drowsiness. ? Make important decisions or sign legal documents. ? Take care of children on your own. Eating and drinking  Follow  any instructions from your health care provider about eating or drinking restrictions.  When you feel hungry, start by eating small amounts of foods that are soft and easy to digest (bland), such as toast. Gradually return to your regular diet.  Drink enough fluid to keep your urine pale yellow.  If you vomit, rehydrate by drinking water, juice, or clear broth. General instructions  If you have sleep apnea, surgery and certain medicines can increase your risk for breathing problems. Follow instructions from your health care provider about wearing your sleep device: ? Anytime you are sleeping, including during daytime naps. ? While taking prescription pain medicines, sleeping medicines, or medicines that   make you drowsy.  Return to your normal activities as told by your health care provider. Ask your health care provider what activities are safe for you.  Take over-the-counter and prescription medicines only as told by your health care provider.  If you smoke, do not smoke without supervision.  Keep all follow-up visits as told by your health care provider. This is important. Contact a health care provider if:  You have nausea or vomiting that does not get better with medicine.  You cannot eat or drink without vomiting.  You have pain that does not get better with medicine.  You are unable to pass urine.  You develop a skin rash.  You have a fever.  You have redness around your IV site that gets worse. Get help right away if:  You have difficulty breathing.  You have chest pain.  You have blood in your urine or stool, or you vomit blood. Summary  After the procedure, it is common to have a sore throat or nausea. It is also common to feel tired.  Have a responsible adult stay with you for the first 24 hours after general anesthesia. It is important to have someone help care for you until you are awake and alert.  When you feel hungry, start by eating small amounts of  foods that are soft and easy to digest (bland), such as toast. Gradually return to your regular diet.  Drink enough fluid to keep your urine pale yellow.  Return to your normal activities as told by your health care provider. Ask your health care provider what activities are safe for you. This information is not intended to replace advice given to you by your health care provider. Make sure you discuss any questions you have with your health care provider. Document Revised: 05/02/2017 Document Reviewed: 12/13/2016 Elsevier Patient Education  2020 [employer]  

## 2020-04-13 ENCOUNTER — Ambulatory Visit
Admission: RE | Admit: 2020-04-13 | Discharge: 2020-04-13 | Disposition: A | Discharge status: Home / Self Care | Payer: 59 | Attending: Otolaryngology | Admitting: Otolaryngology

## 2020-04-13 ENCOUNTER — Encounter
Admission: RE | Disposition: A | Discharge status: Home / Self Care | Payer: Self-pay | Source: Home / Self Care | Attending: Otolaryngology

## 2020-04-13 ENCOUNTER — Ambulatory Visit: Payer: 59 | Admitting: Anesthesiology

## 2020-04-13 ENCOUNTER — Other Ambulatory Visit: Payer: Self-pay | Admitting: Otolaryngology

## 2020-04-13 ENCOUNTER — Other Ambulatory Visit: Payer: Self-pay

## 2020-04-13 ENCOUNTER — Encounter: Payer: Self-pay | Admitting: Otolaryngology

## 2020-04-13 DIAGNOSIS — Z79899 Other long term (current) drug therapy: Secondary | ICD-10-CM | POA: Insufficient documentation

## 2020-04-13 DIAGNOSIS — B49 Unspecified mycosis: Secondary | ICD-10-CM | POA: Insufficient documentation

## 2020-04-13 DIAGNOSIS — J343 Hypertrophy of nasal turbinates: Secondary | ICD-10-CM | POA: Diagnosis not present

## 2020-04-13 DIAGNOSIS — J320 Chronic maxillary sinusitis: Secondary | ICD-10-CM | POA: Insufficient documentation

## 2020-04-13 HISTORY — PX: MAXILLARY ANTROSTOMY: SHX2003

## 2020-04-13 HISTORY — PX: IMAGE GUIDED SINUS SURGERY: SHX6570

## 2020-04-13 LAB — GLUCOSE, CAPILLARY
Glucose-Capillary: 118 mg/dL — ABNORMAL HIGH (ref 70–99)
Glucose-Capillary: 134 mg/dL — ABNORMAL HIGH (ref 70–99)

## 2020-04-13 SURGERY — SINUS SURGERY, WITH IMAGING GUIDANCE
Anesthesia: General | Site: Nose | Laterality: Left

## 2020-04-13 MED ORDER — OXYCODONE HCL 5 MG PO TABS: 5.0000 mg | ORAL_TABLET | Freq: Once | ORAL | Status: AC | PRN

## 2020-04-13 MED ORDER — SCOPOLAMINE 1 MG/3DAYS TD PT72: 1.0000 | MEDICATED_PATCH | TRANSDERMAL | Status: DC

## 2020-04-13 MED ORDER — LIDOCAINE-EPINEPHRINE 1 %-1:100000 IJ SOLN
INTRAMUSCULAR | Status: DC | PRN
  Administered 2020-04-13: 6 mL

## 2020-04-13 MED ORDER — FENTANYL CITRATE (PF) 100 MCG/2ML IJ SOLN
INTRAMUSCULAR | Status: DC | PRN
  Administered 2020-04-13: 100 ug via INTRAVENOUS

## 2020-04-13 MED ORDER — SUCCINYLCHOLINE CHLORIDE 20 MG/ML IJ SOLN
INTRAMUSCULAR | Status: DC | PRN
  Administered 2020-04-13: 100 mg via INTRAVENOUS

## 2020-04-13 MED ORDER — HYDROCODONE-ACETAMINOPHEN 5-325 MG PO TABS
1.0000 | ORAL_TABLET | Freq: Four times a day (QID) | ORAL | 0 refills | Status: DC | PRN
Start: 2020-04-13 — End: 2020-04-13

## 2020-04-13 MED ORDER — LACTATED RINGERS IV SOLN: INTRAVENOUS | Status: DC

## 2020-04-13 MED ORDER — HYDRALAZINE HCL 20 MG/ML IJ SOLN
10.0000 mg | Freq: Once | INTRAMUSCULAR | Status: AC
  Administered 2020-04-13: 10 mg via INTRAVENOUS

## 2020-04-13 MED ORDER — ROCURONIUM BROMIDE 100 MG/10ML IV SOLN
INTRAVENOUS | Status: DC | PRN
  Administered 2020-04-13: 5 mg via INTRAVENOUS

## 2020-04-13 MED ORDER — ONDANSETRON HCL 4 MG/2ML IJ SOLN
INTRAMUSCULAR | Status: DC | PRN
  Administered 2020-04-13: 4 mg via INTRAVENOUS

## 2020-04-13 MED ORDER — ONDANSETRON HCL 4 MG/2ML IJ SOLN: 4.0000 mg | Freq: Once | INTRAMUSCULAR | Status: DC | PRN

## 2020-04-13 MED ORDER — KETOROLAC TROMETHAMINE 30 MG/ML IJ SOLN: 15.0000 mg | Freq: Once | INTRAMUSCULAR | Status: DC | PRN

## 2020-04-13 MED ORDER — FENTANYL CITRATE (PF) 100 MCG/2ML IJ SOLN
25.0000 ug | INTRAMUSCULAR | Status: DC | PRN
  Administered 2020-04-13 (×2): 25 ug via INTRAVENOUS
  Administered 2020-04-13: 50 ug via INTRAVENOUS

## 2020-04-13 MED ORDER — DEXAMETHASONE SODIUM PHOSPHATE 4 MG/ML IJ SOLN
INTRAMUSCULAR | Status: DC | PRN
  Administered 2020-04-13: 8 mg via INTRAVENOUS

## 2020-04-13 MED ORDER — PREDNISONE 10 MG PO TABS: ORAL_TABLET | ORAL | 0 refills | Status: DC

## 2020-04-13 MED ORDER — PHENYLEPHRINE HCL 0.5 % NA SOLN
NASAL | Status: DC | PRN
  Administered 2020-04-13: 30 mL via TOPICAL

## 2020-04-13 MED ORDER — PROPOFOL 10 MG/ML IV BOLUS
INTRAVENOUS | Status: DC | PRN
  Administered 2020-04-13: 150 mg via INTRAVENOUS
  Administered 2020-04-13: 20 mg via INTRAVENOUS

## 2020-04-13 MED ORDER — GLYCOPYRROLATE 0.2 MG/ML IJ SOLN
INTRAMUSCULAR | Status: DC | PRN
  Administered 2020-04-13: .2 mg via INTRAVENOUS

## 2020-04-13 MED ORDER — DEXTROSE 5 % IV SOLN
2000.0000 mg | Freq: Once | INTRAVENOUS | Status: AC
  Administered 2020-04-13: 2000 mg via INTRAVENOUS

## 2020-04-13 MED ORDER — LIDOCAINE HCL (CARDIAC) PF 100 MG/5ML IV SOSY
PREFILLED_SYRINGE | INTRAVENOUS | Status: DC | PRN
  Administered 2020-04-13: 50 mg via INTRAVENOUS

## 2020-04-13 MED ORDER — OXYMETAZOLINE HCL 0.05 % NA SOLN
2.0000 | Freq: Once | NASAL | Status: AC
  Administered 2020-04-13: 2 via NASAL

## 2020-04-13 MED ORDER — MIDAZOLAM HCL 5 MG/5ML IJ SOLN
INTRAMUSCULAR | Status: DC | PRN
  Administered 2020-04-13: 2 mg via INTRAVENOUS

## 2020-04-13 MED ORDER — OXYCODONE HCL 5 MG/5ML PO SOLN
5.0000 mg | Freq: Once | ORAL | Status: AC | PRN
  Administered 2020-04-13: 5 mg via ORAL

## 2020-04-13 MED ORDER — AMOXICILLIN-POT CLAVULANATE 875-125 MG PO TABS: 1.0000 | ORAL_TABLET | Freq: Two times a day (BID) | ORAL | 0 refills | Status: DC

## 2020-04-13 MED ORDER — ACETAMINOPHEN 10 MG/ML IV SOLN
1000.0000 mg | Freq: Once | INTRAVENOUS | Status: AC
  Administered 2020-04-13: 1000 mg via INTRAVENOUS

## 2020-04-13 SURGICAL SUPPLY — 30 items
BATTERY INSTRU NAVIGATION (MISCELLANEOUS) ×6 IMPLANT
BTRY SRG DRVR LF (MISCELLANEOUS) ×3
CATH IV 18X1 1/4 SAFELET (CATHETERS) ×2 IMPLANT
COAGULATOR SUCT 8FR VV (MISCELLANEOUS) ×2 IMPLANT
ELECT REM PT RETURN 9FT ADLT (ELECTROSURGICAL) ×2
ELECTRODE REM PT RTRN 9FT ADLT (ELECTROSURGICAL) ×1 IMPLANT
GLOVE PI ULTRA LF STRL 7.5 (GLOVE) ×3 IMPLANT
GLOVE PI ULTRA NON LATEX 7.5 (GLOVE) ×3
GOWN STRL REUS W/ TWL LRG LVL3 (GOWN DISPOSABLE) ×1 IMPLANT
GOWN STRL REUS W/TWL LRG LVL3 (GOWN DISPOSABLE) ×2
IV CATH 18X1 1/4 SAFELET (CATHETERS) ×1
IV NS 500ML (IV SOLUTION) ×2
IV NS 500ML BAXH (IV SOLUTION) ×1 IMPLANT
KIT TURNOVER KIT A (KITS) ×2 IMPLANT
NEEDLE ANESTHESIA  27G X 3.5 (NEEDLE) ×1
NEEDLE ANESTHESIA 27G X 3.5 (NEEDLE) ×1 IMPLANT
NEEDLE HYPO 27GX1-1/4 (NEEDLE) ×2 IMPLANT
NS IRRIG 500ML POUR BTL (IV SOLUTION) ×2 IMPLANT
PACK ENT CUSTOM (PACKS) ×2 IMPLANT
PACKING NASAL EPIS 4X2.4 XEROG (MISCELLANEOUS) ×4 IMPLANT
PATTIES SURGICAL .5 X3 (DISPOSABLE) ×2 IMPLANT
SHAVER DIEGO BLD STD TYPE A (BLADE) IMPLANT
SOL ANTI-FOG 6CC FOG-OUT (MISCELLANEOUS) ×1 IMPLANT
SOL FOG-OUT ANTI-FOG 6CC (MISCELLANEOUS) ×1
STRAP BODY AND KNEE 60X3 (MISCELLANEOUS) ×2 IMPLANT
SYR 3ML LL SCALE MARK (SYRINGE) ×2 IMPLANT
TOWEL OR 17X26 4PK STRL BLUE (TOWEL DISPOSABLE) ×2 IMPLANT
TRACKER CRANIALMASK (MASK) ×2 IMPLANT
TUBING DECLOG MULTIDEBRIDER (TUBING) ×2 IMPLANT
WATER STERILE IRR 250ML POUR (IV SOLUTION) ×2 IMPLANT

## 2020-04-13 NOTE — Op Note (Signed)
04/13/2020  11:37 AM    Larita Fife  [id number]   Pre-Op Dx: Chronic left maxillary sinusitis with fungus ball not completely controlled  Post-op Dx: Chronic left maxillary sinusitis with fungus ball under inferior turbinate and through the medial wall of sinus  Proc: Left endoscopic medial maxillectomy with trimming of the left inferior turbinate  Surg:  Beverly Sessions Ketih Goodie  Anes:  GOT  EBL: 150 mL  Comp: None  Findings: There was evidence of fungus ball underneath the inferior turbinate.  As this was cleaned out there was a previous maxillectomy hole through the medial wall of the maxilla but not in connection with the natural antrostomy.  This hole was made from previous maxillary surgery many years ago.  The fungus ball extended into the maxillary sinus from underneath the middle turbinate.  There was scar tissue that was holding mucus here and creating a pocket under the turbinate.  The midportion of the turbinate had to be removed along with cleaning out some the bone of the medial maxillary wall to provide good access for flow and cleaning directly into the maxillary sinus.  The posterior medial wall of the maxillary sinus was taken down as inferiorly and posteriorly as possible.  There is a small inferior turbinate remnant posteriorly and then part of the remnant anteriorly.    Procedure: Patient was brought to the operating room and placed in the supine position.  She was given general anesthesia by oral endotracheal intubation.  Once the patient was asleep the nose was prepped using cottonoid pledgets soaked in Afrin and 4% lidocaine.  The image guided system was brought in and the CT scan was downloaded from the disc.  The template was applied to the face and the patient's face was registered to the system.  There was 0.6 mm of variance.  The suction instruments were then registered to the system and there was good alignment with the use and the CT scan.  Cotton pledgets were  removed and 0 degree and 30 degree scopes were used for visualizing the area.  Pictures were taken underneath the inferior turbinate where the fungus ball was collecting in a pocket here.  As this was cleaned out he could see the hole in the medial wall that the fungus ball was coursing through to get into the maxillary sinus.  This was all cleaned out.  Pictures were taken of the area.  The medial maxillectomy was begun with incising the middle turbinate about halfway back to remove a section of turbinate and allow access into the maxillary sinus here.  There is the previous antrostomy hole that was then widened using backbiters anteriorly and inferiorly to widen the natural ostium inferiorly and anteriorly.  Once this hole was included then the posterior wall was trimmed down as far to the posterior wall and inferior wall of the maxillary sinus is possible.  This left where the sinus could easily drain down into the throat.  The inferior turbinate was then trimmed off prior to its distal end.  This left an open of inferior turbinate that was cauterized to control bleeding.  The 30 and 70 degree scopes were used to make sure that all of the debris was suctioned out of the maxillary sinus and cleaned from this.  There was inflamed and thickened mucous membranes that were along the posterior and inferior walls of maxillary sinus and these were trimmed off using the microdebrider.  The inferior turbinate was trimmed along its middle area with some  with the microdebrider as well to make sure that it had a smooth edge to it.  Electrocautery was then used to control bleeding from the middle turbinate remnant.  The 30 and 70 degree scopes were used to visualize now the entire maxillary sinus that was open and clear the posterior wall of the maxillary sinus was pretty much flattened out to go into the nasal cavity opening.  The inferior wall was taken all the way down to the floor and some of the scar tissue and from  the pocket that was collected here was trimmed off of the floor of the nasal cavity so that this was smooth going back through the nose.  There is a previous small septal perforation posteriorly that it been there from many years before and this was not touched.  He can now see into the maxillary sinus easily and it could drain directly into the nasal cavity with no areas were mucus could not collect to continue to create fungus balls.  Care was taken to make sure there is no significant bleeding and electrocautery was used along the rough edges.  There is no further bleeding noted.  Xerogel was then placed as a large sheet from the posterior maxillary sinus wall to draped over the inferior portion and down onto the floor the nose.  This also covered over the inferior turbinate remnant.  This was wetted so that it was soft and covered these areas well.  There is no bleeding noted at this time.  The airway was open posteriorly.  Remaining inferior turbinate remnant was outfractured slightly to help keep the airway open.  The patient tolerated the procedure well.  She was awakened and taken to the recovery room in satisfactory condition.  There were no operative complications.  Dispo: She was taken to PACU to be discharged home  Plan: Patient will rest at home with her head elevated.  She can start saline flushes tomorrow start washing out some of the xerogel.  I have given her Norco to use for pain as needed.  She will use Augmentin postoperatively twice a day and will use a short prednisone taper for 6 days.  Patient will be seen by Dr.Vaught in follow-up in 5 to 6 days and then will see me in 2 weeks for further follow-up.  Beverly Sessions Ngai Parcell  04/13/2020 11:37 AM

## 2020-04-13 NOTE — Transfer of Care (Signed)
Immediate Anesthesia Transfer of Care Note  Patient: Courtney Lopez  Procedure(s) Performed: IMAGE GUIDED SINUS SURGERY (Left Nose) MAXILLECTOMY (Left Nose)  Patient Location: PACU  Anesthesia Type: General  Level of Consciousness: awake, alert  and patient cooperative  Airway and Oxygen Therapy: Patient Spontanous Breathing and Patient connected to supplemental oxygen  Post-op Assessment: Post-op Vital signs reviewed, Patient's Cardiovascular Status Stable, Respiratory Function Stable, Patent Airway and No signs of Nausea or vomiting  Post-op Vital Signs: Reviewed and stable  Complications: No complications documented.

## 2020-04-13 NOTE — Anesthesia Postprocedure Evaluation (Signed)
Anesthesia Post Note  Patient: Courtney Lopez  Procedure(s) Performed: IMAGE GUIDED SINUS SURGERY (Left Nose) MAXILLECTOMY (Left Nose)     Patient location during evaluation: PACU Anesthesia Type: General Level of consciousness: awake and alert Pain management: pain level controlled Vital Signs Assessment: post-procedure vital signs reviewed and stable Respiratory status: spontaneous breathing, nonlabored ventilation, respiratory function stable and patient connected to nasal cannula oxygen Cardiovascular status: blood pressure returned to baseline and stable Postop Assessment: no apparent nausea or vomiting Anesthetic complications: no   No complications documented.  Edwyna Ready

## 2020-04-13 NOTE — H&P (Signed)
H&P has been reviewed and patient reevaluated, no changes necessary. To be downloaded later.  

## 2020-04-13 NOTE — Anesthesia Procedure Notes (Signed)
Procedure Name: Intubation Date/Time: 04/13/2020 9:58 AM Performed by: Cameron Ali, CRNA Pre-anesthesia Checklist: Patient identified, Emergency Drugs available, Suction available, Patient being monitored and Timeout performed Patient Re-evaluated:Patient Re-evaluated prior to induction Oxygen Delivery Method: Circle system utilized Preoxygenation: Pre-oxygenation with 100% oxygen Induction Type: IV induction Ventilation: Mask ventilation without difficulty Laryngoscope Size: Mac and 3 Grade View: Grade II Tube type: Oral Rae Tube size: 7.0 mm Number of attempts: 1 Airway Equipment and Method: Stylet Placement Confirmation: ETT inserted through vocal cords under direct vision,  positive ETCO2 and breath sounds checked- equal and bilateral Tube secured with: Tape Dental Injury: Teeth and Oropharynx as per pre-operative assessment  Comments: C-spine neutral throughout intubation.

## 2020-04-13 NOTE — Anesthesia Preprocedure Evaluation (Signed)
Anesthesia Evaluation  Patient identified by MRN, date of birth, ID band Patient awake    Reviewed: Allergy & Precautions, NPO status , Patient's Chart, lab work & pertinent test results  History of Anesthesia Complications Negative for: history of anesthetic complications  Airway Mallampati: II  TM Distance: >3 FB Neck ROM: Full    Dental no notable dental hx.    Pulmonary neg pulmonary ROS,    Pulmonary exam normal breath sounds clear to auscultation       Cardiovascular Exercise Tolerance: Good hypertension, + Peripheral Vascular Disease  Normal cardiovascular exam+ Valvular Problems/Murmurs (Moderate AS, AVA 1.09 cm2) AS  Rhythm:Regular Rate:Normal  12/2019 ECHO 1. Left ventricular ejection fraction, by estimation, is 60 to 65%. The  left ventricle has normal function. The left ventricle has no regional wall motion abnormalities. There is mild left ventricular hypertrophy. Left ventricular diastolic parameters are consistent with Grade I diastolic dysfunction (impaired relaxation).  2. Right ventricular systolic function is normal. The right ventricular size is normal. There is normal pulmonary artery systolic pressure.  3. The mitral valve is normal in structure. Trivial mitral valve regurgitation.  4. The aortic valve is tricuspid. Aortic valve regurgitation is mild. Moderate aortic valve stenosis. Aortic valve mean gradient measures 22.5 mmHg.  5. Aortic dilatation noted. There is moderate to severe dilatation of the ascending aorta measuring 48 mm.  6. The inferior vena cava is normal in size with greater than 50% respiratory variability, suggesting right atrial pressure of 3 mmHg.    Neuro/Psych PSYCHIATRIC DISORDERS Anxiety negative neurological ROS     GI/Hepatic negative GI ROS, Neg liver ROS,   Endo/Other  diabetes, Type 2  Renal/GU Renal hypertensionRenal disease     Musculoskeletal negative  musculoskeletal ROS (+)   Abdominal Normal abdominal exam  (+) - obese,  Abdomen: soft.    Peds  Hematology negative hematology ROS (+)   Anesthesia Other Findings   Reproductive/Obstetrics                             Anesthesia Physical  Anesthesia Plan  ASA: III  Anesthesia Plan: General   Post-op Pain Management:    Induction: Intravenous  PONV Risk Score and Plan: 3 and Ondansetron, Dexamethasone, Midazolam and Treatment may vary due to age or medical condition  Airway Management Planned: Oral ETT  Additional Equipment: None  Intra-op Plan:   Post-operative Plan: Extubation in OR  Informed Consent: I have reviewed the patients History and Physical, chart, labs and discussed the procedure including the risks, benefits and alternatives for the proposed anesthesia with the patient or authorized representative who has indicated his/her understanding and acceptance.     Dental advisory given  Plan Discussed with: CRNA  Anesthesia Plan Comments:         Anesthesia Quick Evaluation  Patient Active Problem List   Diagnosis Date Noted  . Chronic sinusitis 03/06/2020  . Decreased GFR 11/09/2019  . Diabetic nephropathy associated with type 2 diabetes mellitus (HCC) 10/21/2019  . Renal artery stenosis (HCC) 10/27/2018  . Hypertensive emergency   . Thoracic aortic aneurysm without rupture (HCC)   . Chest pain 03/10/2018  . Paroxysmal tachycardia (HCC) 08/12/2017  . Thyroid nodule 05/15/2017  . Ascending aorta dilatation (HCC) 05/27/2016  . Bicuspid aortic valve 05/27/2016  . Encounter for preventive health examination 10/29/2015  . Adjustment disorder with anxious mood 06/13/2014  . Diabetes mellitus type 2 in obese (HCC) 03/25/2012  .  Aortic stenosis   . Screening for colon cancer 08/25/2011  . Screening for breast cancer 08/25/2011  . Overweight 08/25/2011  . Hyperlipidemia associated with type 2 diabetes mellitus (HCC)  08/25/2011  . Cervical spine disease 06/18/2011  . Hypertension 06/18/2011    CBC Latest Ref Rng & Units 10/21/2018 11/17/2017 11/11/2016  WBC 4.0 - 10.5 K/uL 10.1 11.0(H) 6.3  Hemoglobin 12.0 - 15.0 g/dL 24.4 01.0 27.2  Hematocrit 36 - 46 % 37.9 38.3 39.0  Platelets 150 - 400 K/uL 247 269.0 270.0   BMP Latest Ref Rng & Units 01/14/2020 11/23/2019 11/09/2019  Glucose 70 - 99 mg/dL 536(U) 440(H) 474(Q)  BUN 6 - 23 mg/dL 21 22 59(D)  Creatinine 0.40 - 1.20 mg/dL 6.38 7.56 4.33(I)  Sodium 135 - 145 mEq/L 139 140 135  Potassium 3.5 - 5.1 mEq/L 4.4 4.5 3.9  Chloride 96 - 112 mEq/L 100 102 94(L)  CO2 19 - 32 mEq/L 30 30 33(H)  Calcium 8.4 - 10.5 mg/dL 9.2 9.3 9.5    Risks and benefits of anesthesia discussed at length, patient or surrogate demonstrates understanding. Appropriately NPO. Plan to proceed with anesthesia.  Corlis Leak, MD 04/13/20

## 2020-04-14 ENCOUNTER — Encounter: Payer: Self-pay | Admitting: Otolaryngology

## 2020-04-14 DIAGNOSIS — I10 Essential (primary) hypertension: Secondary | ICD-10-CM

## 2020-04-17 LAB — SURGICAL PATHOLOGY

## 2020-04-18 DIAGNOSIS — Z48813 Encounter for surgical aftercare following surgery on the respiratory system: Secondary | ICD-10-CM | POA: Diagnosis not present

## 2020-04-19 ENCOUNTER — Other Ambulatory Visit: Payer: Self-pay | Admitting: Cardiovascular Disease

## 2020-04-19 MED ORDER — SPIRONOLACTONE 25 MG PO TABS: 25.0000 mg | ORAL_TABLET | Freq: Every day | ORAL | 5 refills | Status: DC

## 2020-04-24 ENCOUNTER — Ambulatory Visit
Admission: RE | Admit: 2020-04-24 | Discharge: 2020-04-24 | Disposition: A | Discharge status: Home / Self Care | Payer: PRIVATE HEALTH INSURANCE | Source: Ambulatory Visit | Attending: Orthopaedic Surgery | Admitting: Orthopaedic Surgery

## 2020-04-24 ENCOUNTER — Other Ambulatory Visit: Payer: Self-pay

## 2020-04-24 DIAGNOSIS — M25572 Pain in left ankle and joints of left foot: Secondary | ICD-10-CM | POA: Diagnosis not present

## 2020-04-24 DIAGNOSIS — M19072 Primary osteoarthritis, left ankle and foot: Secondary | ICD-10-CM | POA: Diagnosis not present

## 2020-04-24 DIAGNOSIS — R200 Anesthesia of skin: Secondary | ICD-10-CM | POA: Diagnosis not present

## 2020-04-24 DIAGNOSIS — M722 Plantar fascial fibromatosis: Secondary | ICD-10-CM | POA: Diagnosis not present

## 2020-04-26 ENCOUNTER — Other Ambulatory Visit: Payer: Self-pay | Admitting: Otolaryngology

## 2020-04-26 ENCOUNTER — Ambulatory Visit: Payer: 59 | Admitting: Internal Medicine

## 2020-04-26 DIAGNOSIS — J320 Chronic maxillary sinusitis: Secondary | ICD-10-CM | POA: Diagnosis not present

## 2020-05-03 DIAGNOSIS — Z48813 Encounter for surgical aftercare following surgery on the respiratory system: Secondary | ICD-10-CM | POA: Diagnosis not present

## 2020-05-11 ENCOUNTER — Other Ambulatory Visit: Payer: Self-pay | Admitting: Internal Medicine

## 2020-05-11 DIAGNOSIS — J069 Acute upper respiratory infection, unspecified: Secondary | ICD-10-CM

## 2020-05-12 ENCOUNTER — Other Ambulatory Visit
Admission: RE | Admit: 2020-05-12 | Discharge: 2020-05-12 | Disposition: A | Discharge status: Home / Self Care | Payer: 59 | Source: Ambulatory Visit | Attending: Cardiovascular Disease | Admitting: Cardiovascular Disease

## 2020-05-12 DIAGNOSIS — I10 Essential (primary) hypertension: Secondary | ICD-10-CM | POA: Insufficient documentation

## 2020-05-12 LAB — BASIC METABOLIC PANEL
Anion gap: 9 (ref 5–15)
BUN: 35 mg/dL — ABNORMAL HIGH (ref 8–23)
CO2: 26 mmol/L (ref 22–32)
Calcium: 9.6 mg/dL (ref 8.9–10.3)
Chloride: 103 mmol/L (ref 98–111)
Creatinine, Ser: 1.03 mg/dL — ABNORMAL HIGH (ref 0.44–1.00)
GFR, Estimated: >60 mL/min (ref >60)
Glucose, Bld: 122 mg/dL — ABNORMAL HIGH (ref 70–99)
Potassium: 4.6 mmol/L (ref 3.5–5.1)
Sodium: 138 mmol/L (ref 135–145)

## 2020-05-17 ENCOUNTER — Telehealth: Payer: Self-pay

## 2020-05-17 NOTE — Telephone Encounter (Signed)
Called to give the patient lab results and Dr. Arida's recommendation. [email or web address]. 

## 2020-05-17 NOTE — Telephone Encounter (Signed)
-----   Message from [place] Ouch, MD sent at 05/17/2020 11:31 AM EST ----- Slight worsening of renal function on spironolactone.  The patient sent my chart message that she already stopped spironolactone.  She is already on carvedilol and losartan at maximal dose.  Alternatives to spironolactone include trying amlodipine again at a lower dose of 2.5 mg once daily.  She had leg swelling at the higher dose of 5 mg.  If she does not want to go back on amlodipine, we can try hydralazine 25 mg 3 times daily.

## 2020-05-18 DIAGNOSIS — Z48813 Encounter for surgical aftercare following surgery on the respiratory system: Secondary | ICD-10-CM | POA: Diagnosis not present

## 2020-05-22 ENCOUNTER — Other Ambulatory Visit: Payer: Self-pay | Admitting: Cardiovascular Disease

## 2020-05-22 MED ORDER — HYDRALAZINE HCL 25 MG PO TABS: 25.0000 mg | ORAL_TABLET | Freq: Three times a day (TID) | ORAL | 1 refills | Status: DC | PRN

## 2020-05-22 NOTE — Telephone Encounter (Signed)
See 04/14/20 mychart encounter for additional documentation.

## 2020-05-26 ENCOUNTER — Telehealth: Payer: 59 | Admitting: Internal Medicine

## 2020-05-26 ENCOUNTER — Other Ambulatory Visit: Payer: Self-pay | Admitting: Internal Medicine

## 2020-05-26 ENCOUNTER — Encounter: Payer: Self-pay | Admitting: Internal Medicine

## 2020-05-26 DIAGNOSIS — I10 Essential (primary) hypertension: Secondary | ICD-10-CM

## 2020-05-26 DIAGNOSIS — E1121 Type 2 diabetes mellitus with diabetic nephropathy: Secondary | ICD-10-CM

## 2020-05-26 DIAGNOSIS — E041 Nontoxic single thyroid nodule: Secondary | ICD-10-CM

## 2020-05-26 DIAGNOSIS — J320 Chronic maxillary sinusitis: Secondary | ICD-10-CM

## 2020-05-26 DIAGNOSIS — U071 COVID-19: Secondary | ICD-10-CM

## 2020-05-26 DIAGNOSIS — R944 Abnormal results of kidney function studies: Secondary | ICD-10-CM | POA: Diagnosis not present

## 2020-05-26 MED ORDER — CHERATUSSIN AC 100-10 MG/5ML PO SOLN
5.0000 mL | Freq: Three times a day (TID) | ORAL | 0 refills | Status: DC | PRN
Start: 2020-05-26 — End: 2020-08-18

## 2020-05-26 MED ORDER — AZITHROMYCIN 250 MG PO TABS: ORAL_TABLET | ORAL | 0 refills | Status: DC

## 2020-05-26 MED ORDER — PREDNISONE 10 MG PO TABS: ORAL_TABLET | ORAL | 0 refills | Status: DC

## 2020-05-26 MED ORDER — ALBUTEROL SULFATE HFA 108 (90 BASE) MCG/ACT IN AERS: 2.0000 | INHALATION_SPRAY | Freq: Four times a day (QID) | RESPIRATORY_TRACT | 11 refills | Status: DC | PRN

## 2020-05-26 NOTE — Progress Notes (Signed)
Virtual Visit via Caregility  This visit type was conducted due to national recommendations for restrictions regarding the COVID-19 pandemic (e.g. social distancing).  This format is felt to be most appropriate for this patient at this time.  All issues noted in this document were discussed and addressed.  No physical exam was performed (except for noted visual exam findings with Video Visits).   I connected with@ on 05/26/20 at 10:00 AM EST by a video enabled telemedicine application  and verified that I am speaking with the correct person using two identifiers. Location patient: home Location provider: work or home office Persons participating in the virtual visit: patient, provider  I discussed the limitations, risks, security and privacy concerns of performing an evaluation and management service by telephone and the availability of in person appointments. I also discussed with the patient that there may be a patient responsible charge related to this service. The patient expressed understanding and agreed to proceed.  Interactive audio and video telecommunications were attempted between this provider and patient, however failed, due to patient having technical difficulties OR patient did not have access to video capability.  We continued and completed visit with audio only.   Reason for visit: POSITIVE COVID INFECTION   HPI:  60 YR OLD FULLY covid vaccinated female with history of type 2 DM,   Hypertension,  aortic and renal artery stenosis, and thoracic aneurysm presents with positive home test on jan 10 with s/s of fever,  Cough and shortness of breath.   COVID INFECTION:  She endorses persistent Cough and headache  Without hypoxia. .  Tm 101.1    Recently underwent sinus surgery x 2 for aspergillis Oct 2021.  HTN:  bp was elevated earlier in the week .  Developed edema with amlodipine ,  Tried spironolactone   DM:  Has been lost to follow up on diabetes, last a1c June 2021   ROS:  See pertinent positives and negatives per HPI.  Past Medical History:  Diagnosis Date  . Ankle fracture 12/31/2018   left  . Aortic stenosis   . Aortic valve disorders    aortic valve stenosis  . Ascending aortic aneurysm (HCC)   . Breast screening, unspecified   . Cervicalgia    cervical stenosis  . Diabetes mellitus without complication (HCC)   . Fibromuscular dysplasia (HCC)   . Heart murmur   . Metatarsal fracture 2007   5th, from wearing high heels  . Multiple thyroid nodules   . Other symptoms involving abdomen and pelvis(789.9)   . Renal artery stenosis (HCC)    left  . Retinal hemorrhage   . Symptomatic menopausal or female climacteric states   . Unspecified essential hypertension   . Wears contact lenses     Past Surgical History:  Procedure Laterality Date  . ABDOMINAL HYSTERECTOMY    . APPENDECTOMY     done during oophorectomy  . CESAREAN SECTION     x 2  . ETHMOIDECTOMY Left 03/02/2020   Procedure: ETHMOIDECTOMY;  Surgeon: Vernie Murders, MD;  Location[facility];  Service: ENT;  Laterality: Left;  . gyn surgery     hysterectomy- done for adenomysosis and endometriosis  . IMAGE GUIDED SINUS SURGERY N/A 03/02/2020   Procedure: IMAGE GUIDED SINUS SURGERY;  Surgeon: Vernie Murders, MD;  Location: [facility];  Service: ENT;  Laterality: N/A;  need disk PLACED DISK ON OR CHARGE NURSE DESK 9-17 KP  . IMAGE GUIDED SINUS SURGERY Left 04/13/2020   Procedure: IMAGE  GUIDED SINUS SURGERY;  Surgeon: Vernie Murders, MD;  Location: [facility];  Service: ENT;  Laterality: Left;  needs [employer] disk per Gloriajean Dell use same disk as before (October) put on charge nurses desk. DS 04/03/20  . MAXILLARY ANTROSTOMY Left 03/02/2020   Procedure: MAXILLARY ANTROSTOMY;  Surgeon: Vernie Murders, MD;  Location: [facility];  Service: ENT;  Laterality: Left;  [name] MAXILLARY ANTROSTOMY Left 04/13/2020   Procedure: MAXILLECTOMY;  Surgeon: Vernie Murders, MD;   Location[facility];  Service: ENT;  Laterality: Left;  . OOPHORECTOMY     unilateral, due to ruptured ovarian cysts  . OSTEOTOMY MAXILLARY     due to bite problems  . TUBAL LIGATION      Family History  Problem Relation Age of Onset  . Diabetes Mother   . Aortic stenosis Mother   . Cancer Father        leukemia  . Retinoblastoma Son   . Melanoma Son   . Cancer Maternal Aunt        breast  . Breast cancer Maternal Aunt 13    SOCIAL HX:  reports that she has never smoked. She has never used smokeless tobacco. She reports previous alcohol use. She reports that she does not use drugs.   Current Outpatient Medications:  .  albuterol (VENTOLIN HFA) 108 (90 Base) MCG/ACT inhaler, Inhale 2 puffs into the lungs every 6 (six) hours as needed for wheezing or shortness of breath., Disp: 3.7 g, Rfl: 11 .  ALPRAZolam (XANAX) 0.25 MG tablet, TAKE 1 TABLET BY MOUTH TWO TIMES DAILY AS NEEDED, Disp: 60 tablet, Rfl: 5 .  Ascorbic Acid (VITAMIN C PO), Take by mouth daily., Disp: , Rfl:  .  azithromycin (ZITHROMAX) 250 MG tablet, 2 tablets one day 1,  One tablet daily until gone, Disp: 6 tablet, Rfl: 0 .  carvedilol (COREG) 25 MG tablet, TAKE 1 TABLET BY MOUTH TWICE DAILY WITH A MEAL., Disp: 180 tablet, Rfl: 1 .  cholecalciferol (VITAMIN D3) 25 MCG (1000 UT) tablet, Take 2,000 Units by mouth daily., Disp: , Rfl:  .  estradiol (ESTRACE) 0.1 MG/GM vaginal cream, INSERT 1/4 APPLICATORFUL VAGINALLY TWO TIMES A WEEK AS DIRECTED, Disp: 42.5 g, Rfl: 5 .  fluticasone (FLONASE) 50 MCG/ACT nasal spray, PLACE 2 SPRAYS INTO BOTH NOSTRILS DAILY., Disp: 16 g, Rfl: 2 .  Gentamicin Sulfate POWD, , Disp: , Rfl:  .  guaiFENesin-codeine (CHERATUSSIN AC) 100-10 MG/5ML syrup, Take 5 mLs by mouth 3 (three) times daily as needed for cough., Disp: 120 mL, Rfl: 0 .  hydrALAZINE (APRESOLINE) 25 MG tablet, Take 1 tablet (25 mg total) by mouth 3 (three) times daily as needed. For systolic BP >140, Disp: 90 tablet, Rfl:  1 .  HYDROcodone-acetaminophen (NORCO/VICODIN) 5-325 MG tablet, Take 1 tablet by mouth every 4 (four) hours as needed., Disp: , Rfl:  .  losartan (COZAAR) 100 MG tablet, TAKE 1 TABLET (100 MG TOTAL) BY MOUTH DAILY., Disp: 90 tablet, Rfl: 3 .  Menthol, Topical Analgesic, (BIOFREEZE EX), Apply topically., Disp: , Rfl:  .  metFORMIN (GLUCOPHAGE-XR) 750 MG 24 hr tablet, TAKE 1 TABLET BY MOUTH DAILY WITH BREAKFAST., Disp: 90 tablet, Rfl: 0 .  predniSONE (DELTASONE) 10 MG tablet, 6 tablets on Day 1 , then reduce by 1 tablet daily until gone, Disp: 21 tablet, Rfl: 0 .  PREVIDENT 5000 BOOSTER PLUS 1.1 % PSTE, Place onto teeth daily., Disp: , Rfl:  .  rosuvastatin (CRESTOR) 5 MG tablet, Take 1 tablet (  5 mg total) by mouth at bedtime., Disp: 90 tablet, Rfl: 2 .  SF 5000 PLUS 1.1 % CREA dental cream, Take by mouth., Disp: , Rfl:  .  traMADol (ULTRAM) 50 MG tablet, Take 50-100 mg by mouth 4 (four) times daily as needed., Disp: , Rfl:  .  ZINC SULFATE PO, Take by mouth daily., Disp: , Rfl:   EXAM:  VITALS per patient if applicable:  GENERAL: alert, oriented, appears well and in no acute distress  HEENT: atraumatic, conjunttiva clear, no obvious abnormalities on inspection of external nose and ears  NECK: normal movements of the head and neck  LUNGS: on inspection no signs of respiratory distress, breathing rate appears normal, no obvious gross SOB, gasping or wheezing  CV: no obvious cyanosis  MS: moves all visible extremities without noticeable abnormality  PSYCH/NEURO: pleasant and cooperative, no obvious depression or anxiety, speech and thought processing grossly intact  ASSESSMENT AND PLAN:  Discussed the following assessment and plan:  Thyroid nodule  COVID-19 virus infection  Chronic maxillary sinusitis  Decreased GFR  Diabetic nephropathy associated with type 2 diabetes mellitus (HCC)  Primary hypertension  Thyroid nodule Unchanged by June 2021 [place].  Continue annual follow up  for a total of 5 years per radiology  COVID-19 virus infection Symptoms are  Persistent and consistent with pneumonitis.  On Day 4.   Sending prednisone, albuterol and azithromycin .    COVID care and isolation guidelines discussed .  She is not advised to return to work until she is reassessed on Tuesday   Chronic sinusitis Secondary to aspergillus cultured from maxillary sinus surgery Dec 2021 by Dr Elenore Rota  .  Symptoms have resolved.   Decreased GFR Improving GFR and normalization of BUN since stopping hctz.. continue losartan,    Lab Results  Component Value Date   CREATININE 1.03 (H) 05/12/2020     Diabetic nephropathy associated with type 2 diabetes mellitus (HCC) She is overdue to follow up  Taking metformin XR.  Will add ozempic or trulicity if repeat a1c is > 7.0   Continue ARB .     Lab Results  Component Value Date   HGBA1C 7.3 (H) 10/20/2019   Lab Results  Component Value Date   MICROALBUR 3.9 (H) 10/20/2019   MICROALBUR 6.0 (H) 05/15/2018       Hypertension Well controlled on current regimen. Renal function is at baseline without hctz .  no changes today.  Lab Results  Component Value Date   NA 138 05/12/2020   K 4.6 05/12/2020   CL 103 05/12/2020   CO2 26 05/12/2020   Lab Results  Component Value Date   CREATININE 1.03 (H) 05/12/2020       I discussed the assessment and treatment plan with the patient. The patient was provided an opportunity to ask questions and all were answered. The patient agreed with the plan and demonstrated an understanding of the instructions.   The patient was advised to call back or seek an in-person evaluation if the symptoms worsen or if the condition fails to improve as anticipated.  Video connection was lost at > 50% of the duration of the visit ,  At which time the remainder of the visit was completed via audio only.  Sherlene Shams, MD

## 2020-05-26 NOTE — Assessment & Plan Note (Signed)
Unchanged by June 2021 [place].  Continue annual follow up for a total of 5 years per radiology

## 2020-05-28 DIAGNOSIS — U071 COVID-19: Secondary | ICD-10-CM | POA: Insufficient documentation

## 2020-05-28 DIAGNOSIS — Z8616 Personal history of COVID-19: Secondary | ICD-10-CM | POA: Insufficient documentation

## 2020-05-28 NOTE — Assessment & Plan Note (Signed)
Secondary to aspergillus cultured from maxillary sinus surgery Dec 2021 by Dr Elenore Rota  .  Symptoms have resolved.

## 2020-05-28 NOTE — Assessment & Plan Note (Signed)
Improving GFR and normalization of BUN since stopping hctz.. continue losartan,    Lab Results  Component Value Date   CREATININE 1.03 (H) 05/12/2020

## 2020-05-28 NOTE — Assessment & Plan Note (Signed)
She is overdue to follow up  Taking metformin XR.  Will add ozempic or trulicity if repeat a1c is > 7.0   Continue ARB .     Lab Results  Component Value Date   HGBA1C 7.3 (H) 10/20/2019   Lab Results  Component Value Date   MICROALBUR 3.9 (H) 10/20/2019   MICROALBUR 6.0 (H) 05/15/2018      

## 2020-05-28 NOTE — Assessment & Plan Note (Signed)
Well controlled on current regimen. Renal function is at baseline without hctz .  no changes today.  Lab Results  Component Value Date   NA 138 05/12/2020   K 4.6 05/12/2020   CL 103 05/12/2020   CO2 26 05/12/2020   Lab Results  Component Value Date   CREATININE 1.03 (H) 05/12/2020

## 2020-05-28 NOTE — Assessment & Plan Note (Addendum)
Symptoms are  Persistent and consistent with pneumonitis.  On Day 4.   Sending prednisone, albuterol and azithromycin .    COVID care and isolation guidelines discussed .  She is not advised to return to work until she is reassessed on Tuesday

## 2020-06-06 ENCOUNTER — Other Ambulatory Visit: Payer: Self-pay | Admitting: Internal Medicine

## 2020-06-06 ENCOUNTER — Telehealth (INDEPENDENT_AMBULATORY_CARE_PROVIDER_SITE_OTHER): Payer: 59 | Admitting: Internal Medicine

## 2020-06-06 ENCOUNTER — Encounter: Payer: Self-pay | Admitting: Internal Medicine

## 2020-06-06 VITALS — BP 132/86 | HR 74 | Ht 65.0 in | Wt 170.1 lb

## 2020-06-06 DIAGNOSIS — S99919S Unspecified injury of unspecified ankle, sequela: Secondary | ICD-10-CM

## 2020-06-06 DIAGNOSIS — E669 Obesity, unspecified: Secondary | ICD-10-CM

## 2020-06-06 DIAGNOSIS — M489 Spondylopathy, unspecified: Secondary | ICD-10-CM

## 2020-06-06 DIAGNOSIS — E041 Nontoxic single thyroid nodule: Secondary | ICD-10-CM | POA: Diagnosis not present

## 2020-06-06 DIAGNOSIS — I10 Essential (primary) hypertension: Secondary | ICD-10-CM

## 2020-06-06 DIAGNOSIS — E1169 Type 2 diabetes mellitus with other specified complication: Secondary | ICD-10-CM | POA: Diagnosis not present

## 2020-06-06 DIAGNOSIS — U071 COVID-19: Secondary | ICD-10-CM | POA: Diagnosis not present

## 2020-06-06 DIAGNOSIS — E1121 Type 2 diabetes mellitus with diabetic nephropathy: Secondary | ICD-10-CM

## 2020-06-06 DIAGNOSIS — J320 Chronic maxillary sinusitis: Secondary | ICD-10-CM

## 2020-06-06 MED ORDER — HYDROCODONE-ACETAMINOPHEN 10-325 MG PO TABS
1.0000 | ORAL_TABLET | Freq: Four times a day (QID) | ORAL | 0 refills | Status: DC | PRN
Start: 2020-06-06 — End: 2020-06-06

## 2020-06-06 MED ORDER — TRAMADOL HCL 50 MG PO TABS: 100.0000 mg | ORAL_TABLET | Freq: Four times a day (QID) | ORAL | 5 refills | Status: DC | PRN

## 2020-06-06 NOTE — Progress Notes (Signed)
Virtual Visit via caregility  This visit type was conducted due to national recommendations for restrictions regarding the COVID-19 pandemic (e.g. social distancing).  This format is felt to be most appropriate for this patient at this time.  All issues noted in this document were discussed and addressed.  No physical exam was performed (except for noted visual exam findings with Video Visits).   I connected with@ on 06/06/20 at  3:30 PM EST by a video enabled telemedicine application  and verified that I am speaking with the correct person using two identifiers. Location patient: home Location provider: work or home office Persons participating in the virtual visit: patient, provider  I discussed the limitations, risks, security and privacy concerns of performing an evaluation and management service by telephone and the availability of in person appointments. I also discussed with the patient that there may be a patient responsible charge related to this service. The patient expressed understanding and agreed to proceed.   Reason for visit: follow up  On COVID INFECTION,  Multiple other issues  HPI:  1) diagnosed with COVID INFECTION 2 WEEKS AGO .  Recovered uneventfully,  Returned to work .    2) type 2 DM:  Fasting sugars to 120  .  Has been on prednisone for several weeks due to chronic sinusitis has lost weight due to sinus surgery , not exercising due to orthopedic issues. Eye exam was done on July 22 at [facility]   3) Left ankle pain secondary to ankle fracture in August 2020.  Injury occurred at work, so injury handled via  workmen's comp.(Tripped over a manhole cover)  MRI done moderate OA  And plantar fasciitis.  Seeing Susa Simmonds . Can no longer run to [employer].   4) Chronic sinusitis:  Now resolved s/p 2 surgeries in Nov/Dec by Juengel . Still using a medicated rinse.   5) Chronic neck/back pain: managed with 100 mg tramadol levery 6 hours and rare use of Vicodin (once or twice  per week(.  Refills needed  Refill history confirmed via [place] Controlled Substance databas, accessed by me today..   ROS: See pertinent positives and negatives per HPI.  Past Medical History:  Diagnosis Date  . Ankle fracture 12/31/2018   left  . Aortic stenosis   . Aortic valve disorders    aortic valve stenosis  . Ascending aortic aneurysm (HCC)   . Breast screening, unspecified   . Cervicalgia    cervical stenosis  . Diabetes mellitus without complication (HCC)   . Fibromuscular dysplasia (HCC)   . Heart murmur   . Metatarsal fracture 2007   5th, from wearing high heels  . Multiple thyroid nodules   . Other symptoms involving abdomen and pelvis(789.9)   . Renal artery stenosis (HCC)    left  . Retinal hemorrhage   . Symptomatic menopausal or female climacteric states   . Unspecified essential hypertension   . Wears contact lenses     Past Surgical History:  Procedure Laterality Date  . ABDOMINAL HYSTERECTOMY    . APPENDECTOMY     done during oophorectomy  . CESAREAN SECTION     x 2  . ETHMOIDECTOMY Left 03/02/2020   Procedure: ETHMOIDECTOMY;  Surgeon: Vernie Murders, MD;  Location: [facility];  Service: ENT;  Laterality: Left;  . gyn surgery     hysterectomy- done for adenomysosis and endometriosis  . IMAGE GUIDED SINUS SURGERY N/A 03/02/2020   Procedure: IMAGE GUIDED SINUS SURGERY;  Surgeon: Vernie Murders, MD;  Location: [facility];  Service: ENT;  Laterality: N/A;  need disk PLACED DISK ON OR CHARGE NURSE DESK 9-17 KP  . IMAGE GUIDED SINUS SURGERY Left 04/13/2020   Procedure: IMAGE GUIDED SINUS SURGERY;  Surgeon: Vernie MurdersJuengel, Paul, MD;  [facility];  Service: ENT;  Laterality: Left;  needs stryker disk per Gloriajean DellNadine use same disk as before (October) put on charge nurses desk. DS 04/03/20  . MAXILLARY ANTROSTOMY Left 03/02/2020   Procedure: MAXILLARY ANTROSTOMY;  Surgeon: Vernie MurdersJuengel, Paul, MD;  [facility];  Service: ENT;   Laterality: Left;  [name]. MAXILLARY ANTROSTOMY Left 04/13/2020   Procedure: MAXILLECTOMY;  Surgeon: Vernie MurdersJuengel, Paul, MD;  [facility];  Service: ENT;  Laterality: Left;  . OOPHORECTOMY     unilateral, due to ruptured ovarian cysts  . OSTEOTOMY MAXILLARY     due to bite problems  . TUBAL LIGATION      Family History  Problem Relation Age of Onset  . Diabetes Mother   . Aortic stenosis Mother   . Cancer Father        leukemia  . Retinoblastoma Son   . Melanoma Son   . Cancer Maternal Aunt        breast  . Breast cancer Maternal Aunt [id number]    SOCIAL HX:  reports that she has never smoked. She has never used smokeless tobacco. She reports previous alcohol use. She reports that she does not use drugs.   Current Outpatient Medications:  .  albuterol (VENTOLIN HFA) 108 (90 Base) MCG/ACT inhaler, Inhale 2 puffs into the lungs every 6 (six) hours as needed for wheezing or shortness of breath., Disp: 3.7 g, Rfl: 11 .  ALPRAZolam (XANAX) 0.25 MG tablet, TAKE 1 TABLET BY MOUTH TWO TIMES DAILY AS NEEDED, Disp: 60 tablet, Rfl: 5 .  Ascorbic Acid (VITAMIN C PO), Take by mouth daily., Disp: , Rfl:  .  carvedilol (COREG) 25 MG tablet, TAKE 1 TABLET BY MOUTH TWICE DAILY WITH A MEAL., Disp: 180 tablet, Rfl: 1 .  cholecalciferol (VITAMIN D3) 25 MCG (1000 UT) tablet, Take 2,000 Units by mouth daily., Disp: , Rfl:  .  estradiol (ESTRACE) 0.1 MG/GM vaginal cream, INSERT 1/4 APPLICATORFUL VAGINALLY TWO TIMES A WEEK AS DIRECTED, Disp: 42.5 g, Rfl: 5 .  fluticasone (FLONASE) 50 MCG/ACT nasal spray, PLACE 2 SPRAYS INTO BOTH NOSTRILS DAILY., Disp: 16 g, Rfl: 2 .  Gentamicin Sulfate POWD, , Disp: , Rfl:  .  hydrALAZINE (APRESOLINE) 25 MG tablet, Take 1 tablet (25 mg total) by mouth 3 (three) times daily as needed. For systolic BP >140, Disp: 90 tablet, Rfl: 1 .  HYDROcodone-acetaminophen (NORCO) 10-325 MG tablet, Take 1 tablet by mouth every 6 (six) hours as needed for severe pain., Disp: 30 tablet,  Rfl: 0 .  HYDROcodone-acetaminophen (NORCO/VICODIN) 5-325 MG tablet, Take 1 tablet by mouth every 4 (four) hours as needed., Disp: , Rfl:  .  losartan (COZAAR) 100 MG tablet, TAKE 1 TABLET (100 MG TOTAL) BY MOUTH DAILY., Disp: 90 tablet, Rfl: 3 .  Menthol, Topical Analgesic, (BIOFREEZE EX), Apply topically., Disp: , Rfl:  .  metFORMIN (GLUCOPHAGE-XR) 750 MG 24 hr tablet, TAKE 1 TABLET BY MOUTH DAILY WITH BREAKFAST., Disp: 90 tablet, Rfl: 0 .  PREVIDENT 5000 BOOSTER PLUS 1.1 % PSTE, Place onto teeth daily., Disp: , Rfl:  .  rosuvastatin (CRESTOR) 5 MG tablet, Take 1 tablet (5 mg total) by mouth at bedtime., Disp: 90 tablet, Rfl: 2 .  guaiFENesin-codeine (  CHERATUSSIN AC) 100-10 MG/5ML syrup, Take 5 mLs by mouth 3 (three) times daily as needed for cough. (Patient not taking: Reported on 06/06/2020), Disp: 120 mL, Rfl: 0 .  [START ON 06/10/2020] traMADol (ULTRAM) 50 MG tablet, Take 2 tablets (100 mg total) by mouth every 6 (six) hours as needed., Disp: 240 tablet, Rfl: 5  EXAM:  VITALS per patient if applicable:  GENERAL: alert, oriented, appears well and in no acute distress  HEENT: atraumatic, conjunttiva clear, no obvious abnormalities on inspection of external nose and ears  NECK: normal movements of the head and neck  LUNGS: on inspection no signs of respiratory distress, breathing rate appears normal, no obvious gross SOB, gasping or wheezing  CV: no obvious cyanosis  MS: moves all visible extremities without noticeable abnormality  PSYCH/NEURO: pleasant and cooperative, no obvious depression or anxiety, speech and thought processing grossly intact  ASSESSMENT AND PLAN:  Discussed the following assessment and plan:  Diabetes mellitus type 2 in obese (HCC) - Plan: Hemoglobin A1c, Lipid panel, Comprehensive metabolic panel  Diabetic nephropathy associated with type 2 diabetes mellitus (HCC)  Primary hypertension  Thyroid nodule - Plan: TSH  Chronic maxillary sinusitis - Plan:  CBC with Differential/Platelet  Cervical spine disease  COVID-19 virus infection  Ankle injury, sequela  Cervical spine disease Chronic , with cervical stenosis . Currently managed with tramadol most days,  With occasional infrequent use of vicodin . She has not had any ER visits  And has not requested any early refills.  Her Refill history was confirmed via [place] Controlled Substance database by me today during her visit and there have been no prescriptions of controlled substances filled from any providers other than me except postoperatively , . Refilling both today   COVID-19 virus infection Symptoms resolved after treatment with prednisone, albuterol and azithromycin .    She has returned to work  Diabetes mellitus type 2 in obese She is overdue to follow up  Taking metformin XR.  Will add ozempic or trulicity if repeat a1c is > 7.0   Continue ARB .     Lab Results  Component Value Date   HGBA1C 7.3 (H) 10/20/2019   Lab Results  Component Value Date   MICROALBUR 3.9 (H) 10/20/2019   MICROALBUR 6.0 (H) 05/15/2018       Diabetic nephropathy associated with type 2 diabetes mellitus (HCC) Well controlled on current regimen of telmisartan.  no changes today.  Lab Results  Component Value Date   NA 138 05/12/2020   K 4.6 05/12/2020   CL 103 05/12/2020   CO2 26 05/12/2020   Lab Results  Component Value Date   CREATININE 1.03 (H) 05/12/2020     Ankle injury, sequela Secondary to fall at work.  Treatment mandated by [employer].  She continues to avoid exercise due to persistent pain .  Encouraged use of recumbent bike     I discussed the assessment and treatment plan with the patient. The patient was provided an opportunity to ask questions and all were answered. The patient agreed with the plan and demonstrated an understanding of the instructions.   The patient was advised to call back or seek an in-person evaluation if the symptoms worsen or if the condition fails  to improve as anticipated.   I spent 30 minutes dedicated to the care of this patient on the date of this encounter to include pre-visit review of his medical history,  Face-to-face time with the patient , and post visit ordering  of testing and therapeutics.  Sherlene Shams, MD

## 2020-06-07 ENCOUNTER — Other Ambulatory Visit: Payer: Self-pay | Admitting: Orthopaedic Surgery

## 2020-06-07 DIAGNOSIS — S99919S Unspecified injury of unspecified ankle, sequela: Secondary | ICD-10-CM | POA: Insufficient documentation

## 2020-06-07 DIAGNOSIS — M25572 Pain in left ankle and joints of left foot: Secondary | ICD-10-CM

## 2020-06-07 NOTE — Assessment & Plan Note (Signed)
She is overdue to follow up  Taking metformin XR.  Will add ozempic or trulicity if repeat a1c is > 7.0   Continue ARB .     Lab Results  Component Value Date   HGBA1C 7.3 (H) 10/20/2019   Lab Results  Component Value Date   MICROALBUR 3.9 (H) 10/20/2019   MICROALBUR 6.0 (H) 05/15/2018

## 2020-06-07 NOTE — Assessment & Plan Note (Signed)
Secondary to fall at work.  Treatment mandated by [employer].  She continues to avoid exercise due to persistent pain .  Encouraged use of recumbent bike

## 2020-06-07 NOTE — Assessment & Plan Note (Signed)
Well controlled on current regimen of telmisartan.  no changes today.  Lab Results  Component Value Date   NA 138 05/12/2020   K 4.6 05/12/2020   CL 103 05/12/2020   CO2 26 05/12/2020   Lab Results  Component Value Date   CREATININE 1.03 (H) 05/12/2020

## 2020-06-07 NOTE — Assessment & Plan Note (Signed)
Symptoms resolved after treatment with prednisone, albuterol and azithromycin .    She has returned to work

## 2020-06-07 NOTE — Assessment & Plan Note (Signed)
Chronic , with cervical stenosis . Currently managed with tramadol most days,  With occasional infrequent use of vicodin . She has not had any ER visits  And has not requested any early refills.  Her Refill history was confirmed via [place] Controlled Substance database by me today during her visit and there have been no prescriptions of controlled substances filled from any providers other than me except postoperatively , . Refilling both today

## 2020-06-12 ENCOUNTER — Other Ambulatory Visit: Payer: Self-pay | Admitting: Internal Medicine

## 2020-06-12 NOTE — Telephone Encounter (Signed)
RX Refill: xanax Last Seen:06-06-20 Last ordered:12-15-19

## 2020-06-14 ENCOUNTER — Encounter: Payer: Self-pay | Admitting: Internal Medicine

## 2020-06-19 ENCOUNTER — Ambulatory Visit: Payer: 59

## 2020-06-23 ENCOUNTER — Ambulatory Visit
Admission: RE | Admit: 2020-06-23 | Discharge: 2020-06-23 | Disposition: A | Discharge status: Home / Self Care | Payer: PRIVATE HEALTH INSURANCE | Source: Ambulatory Visit | Attending: Orthopaedic Surgery | Admitting: Orthopaedic Surgery

## 2020-06-23 ENCOUNTER — Other Ambulatory Visit: Payer: Self-pay

## 2020-06-23 DIAGNOSIS — M25572 Pain in left ankle and joints of left foot: Secondary | ICD-10-CM | POA: Diagnosis present

## 2020-06-28 ENCOUNTER — Other Ambulatory Visit: Payer: 59

## 2020-06-29 ENCOUNTER — Other Ambulatory Visit (INDEPENDENT_AMBULATORY_CARE_PROVIDER_SITE_OTHER): Payer: 59

## 2020-06-29 ENCOUNTER — Other Ambulatory Visit: Payer: Self-pay

## 2020-06-29 DIAGNOSIS — E041 Nontoxic single thyroid nodule: Secondary | ICD-10-CM | POA: Diagnosis not present

## 2020-06-29 DIAGNOSIS — E669 Obesity, unspecified: Secondary | ICD-10-CM

## 2020-06-29 DIAGNOSIS — E1169 Type 2 diabetes mellitus with other specified complication: Secondary | ICD-10-CM | POA: Diagnosis not present

## 2020-06-29 DIAGNOSIS — J320 Chronic maxillary sinusitis: Secondary | ICD-10-CM | POA: Diagnosis not present

## 2020-06-29 LAB — LIPID PANEL
Cholesterol: 125 mg/dL (ref 0–200)
HDL: 56 mg/dL (ref >39.00)
LDL Cholesterol: 53 mg/dL (ref 0–99)
NonHDL: 68.7
Total CHOL/HDL Ratio: 2
Triglycerides: 77 mg/dL (ref 0.0–149.0)
VLDL: 15.4 mg/dL (ref 0.0–40.0)

## 2020-06-29 LAB — CBC WITH DIFFERENTIAL/PLATELET
Basophils Absolute: 0.1 10*3/uL (ref 0.0–0.1)
Basophils Relative: 0.8 % (ref 0.0–3.0)
Eosinophils Absolute: 0.2 10*3/uL (ref 0.0–0.7)
Eosinophils Relative: 2.6 % (ref 0.0–5.0)
HCT: 36.4 % (ref 36.0–46.0)
Hemoglobin: 11.8 g/dL — ABNORMAL LOW (ref 12.0–15.0)
Lymphocytes Relative: 32.1 % (ref 12.0–46.0)
Lymphs Abs: 2.6 10*3/uL (ref 0.7–4.0)
MCHC: 32.5 g/dL (ref 30.0–36.0)
MCV: 85.6 fl (ref 78.0–100.0)
Monocytes Absolute: 0.9 10*3/uL (ref 0.1–1.0)
Monocytes Relative: 11.1 % (ref 3.0–12.0)
Neutro Abs: 4.4 10*3/uL (ref 1.4–7.7)
Neutrophils Relative %: 53.4 % (ref 43.0–77.0)
Platelets: 261 10*3/uL (ref 150.0–400.0)
RBC: 4.26 Mil/uL (ref 3.87–5.11)
RDW: 13.5 % (ref 11.5–15.5)
WBC: 8.2 10*3/uL (ref 4.0–10.5)

## 2020-06-29 LAB — COMPREHENSIVE METABOLIC PANEL
ALT: 15 U/L (ref 0–35)
AST: 15 U/L (ref 0–37)
Albumin: 4.2 g/dL (ref 3.5–5.2)
Alkaline Phosphatase: 79 U/L (ref 39–117)
BUN: 23 mg/dL (ref 6–23)
CO2: 31 mEq/L (ref 19–32)
Calcium: 9.5 mg/dL (ref 8.4–10.5)
Chloride: 103 mEq/L (ref 96–112)
Creatinine, Ser: 1.01 mg/dL (ref 0.40–1.20)
GFR: 60.1 mL/min (ref >60.00)
Glucose, Bld: 135 mg/dL — ABNORMAL HIGH (ref 70–99)
Potassium: 4 mEq/L (ref 3.5–5.1)
Sodium: 139 mEq/L (ref 135–145)
Total Bilirubin: 0.5 mg/dL (ref 0.2–1.2)
Total Protein: 6.6 g/dL (ref 6.0–8.3)

## 2020-06-29 LAB — TSH: TSH: 3.03 u[IU]/mL (ref 0.35–4.50)

## 2020-06-29 LAB — HEMOGLOBIN A1C: Hgb A1c MFr Bld: 6.3 % (ref 4.6–6.5)

## 2020-07-02 NOTE — Progress Notes (Signed)
Your diabetes remains under excellent control  And your cholesterol and liver/kidney function are excellent.   However, your hemoglobin has dropped slightly and you are slightly anemic .  Have you recently donated blood?    Regards,   Duncan Dull, MD

## 2020-08-11 ENCOUNTER — Other Ambulatory Visit: Payer: Self-pay | Admitting: Internal Medicine

## 2020-08-15 ENCOUNTER — Telehealth: Payer: 59 | Admitting: Emergency Medicine

## 2020-08-15 ENCOUNTER — Other Ambulatory Visit: Payer: Self-pay

## 2020-08-15 DIAGNOSIS — B9689 Other specified bacterial agents as the cause of diseases classified elsewhere: Secondary | ICD-10-CM

## 2020-08-15 DIAGNOSIS — J019 Acute sinusitis, unspecified: Secondary | ICD-10-CM

## 2020-08-15 MED ORDER — AMOXICILLIN-POT CLAVULANATE 875-125 MG PO TABS
1.0000 | ORAL_TABLET | Freq: Two times a day (BID) | ORAL | 0 refills | Status: DC
  Filled 2020-08-15: qty 14, 7d supply, fill #0

## 2020-08-15 NOTE — Progress Notes (Signed)

## 2020-08-17 ENCOUNTER — Other Ambulatory Visit: Payer: Self-pay | Admitting: Orthopaedic Surgery

## 2020-08-17 ENCOUNTER — Telehealth: Payer: Self-pay | Admitting: Cardiovascular Disease

## 2020-08-17 NOTE — Telephone Encounter (Signed)
   [facility] Pre-operative Risk Assessment    Patient Name: Courtney Lopez  DOB: March 09, 1961  MRN: [id number]   HEARTCARE STAFF: - Please ensure there is not already an duplicate clearance open for this procedure. - Under Visit Info/Reason for Call, type in Other and utilize the format Clearance MM/DD/YY or Clearance TBD. Do not use dashes or single digits. - If request is for dental extraction, please clarify the # of teeth to be extracted.  Request for surgical clearance:  1. What type of surgery is being performed? Left ankle arthroscopic debridement, extensive, arthroscopic treatment of talus osteochondral lesion, lateral ligament recontruction   2. When is this surgery scheduled? 08/24/20  3. What type of clearance is required (medical clearance vs. Pharmacy clearance to hold med vs. Both)? both  4. Are there any medications that need to be held prior to surgery and how long? Not listed, please advise if needed   5. Practice name and name of physician performing surgery? [facility]   6. What is the office phone number? [phone]   7.   What is the office fax number? [phone]  8.   Anesthesia type (None, local, MAC, general) ? General    Caryl Pina Gerringer 08/17/2020, 4:27 PM  _________________________________________________________________   (provider comments below)

## 2020-08-18 ENCOUNTER — Other Ambulatory Visit: Payer: Self-pay

## 2020-08-18 ENCOUNTER — Encounter (HOSPITAL_BASED_OUTPATIENT_CLINIC_OR_DEPARTMENT_OTHER): Payer: Self-pay | Admitting: Orthopaedic Surgery

## 2020-08-18 NOTE — Telephone Encounter (Signed)
   Patient Name: Courtney Lopez  DOB: January 03, 1961  MRN: [id number]   Primary Cardiologist: No primary care provider on file.  Chart reviewed as part of pre-operative protocol coverage. Because of Courtney Lopez's past medical history and time since last visit, she will require a follow-up visit in order to better assess preoperative cardiovascular risk.  Pre-op covering staff: - Please schedule appointment and call patient to inform them. If patient already had an upcoming appointment within acceptable timeframe, please add "pre-op clearance" to the appointment notes so provider is aware. - Please contact requesting surgeon's office via preferred method (i.e, phone, fax) to inform them of need for appointment prior to surgery.  If applicable, this message will also be routed to pharmacy pool and/or primary cardiologist for input on holding anticoagulant/antiplatelet agent as requested below so that this information is available to the clearing provider at time of patient's appointment.   Please arrange a earlier visit with Dr. Mariah Milling.  Last visit with Dr. Mariah Milling was a year ago.  [place], [place]  08/18/2020, 11:12 AM

## 2020-08-18 NOTE — Telephone Encounter (Signed)
Called the requesting office and informed them that patient is scheduled to see Courtney Ducking, NP on 08/22/20 at 8:25 AM. Person thanked me for calling and stated that she will pass the information along.

## 2020-08-18 NOTE — Telephone Encounter (Signed)
Called patient and informed her reason for my call. Patient could come into the office this afternoon due to being at work and stated that she could come on Tuesday 08/22/20 if there was anything available. Patient is scheduled to see Nicolasa Ducking, NP on 08/22/20 at 8:25 AM. Patient thanked me for calling and being able to work with her schedule. Patient verbalized understanding and all (if any) questions were answered.

## 2020-08-18 NOTE — Progress Notes (Signed)
Patient's chart including all cardiac testing and last cards notes reviewed with Dr Malen Gauze, states patient OK for [facility] [address]

## 2020-08-21 ENCOUNTER — Encounter (HOSPITAL_COMMUNITY): Payer: 59 | Attending: Orthopaedic Surgery

## 2020-08-22 ENCOUNTER — Other Ambulatory Visit: Payer: Self-pay

## 2020-08-22 ENCOUNTER — Encounter: Payer: Self-pay | Admitting: Nurse Practitioner

## 2020-08-22 ENCOUNTER — Other Ambulatory Visit
Admission: RE | Admit: 2020-08-22 | Discharge: 2020-08-22 | Disposition: A | Discharge status: Home / Self Care | Payer: PRIVATE HEALTH INSURANCE | Source: Ambulatory Visit | Attending: Orthopaedic Surgery | Admitting: Orthopaedic Surgery

## 2020-08-22 ENCOUNTER — Other Ambulatory Visit
Admission: RE | Admit: 2020-08-22 | Discharge: 2020-08-22 | Disposition: A | Discharge status: Home / Self Care | Payer: PRIVATE HEALTH INSURANCE | Attending: Anesthesiology | Admitting: Anesthesiology

## 2020-08-22 ENCOUNTER — Ambulatory Visit (INDEPENDENT_AMBULATORY_CARE_PROVIDER_SITE_OTHER): Payer: PRIVATE HEALTH INSURANCE | Admitting: Nurse Practitioner

## 2020-08-22 VITALS — BP 138/90 | HR 65 | Ht 66.5 in | Wt 181.4 lb

## 2020-08-22 DIAGNOSIS — Z20822 Contact with and (suspected) exposure to covid-19: Secondary | ICD-10-CM | POA: Diagnosis not present

## 2020-08-22 DIAGNOSIS — Z01810 Encounter for preprocedural cardiovascular examination: Secondary | ICD-10-CM | POA: Diagnosis not present

## 2020-08-22 DIAGNOSIS — I712 Thoracic aortic aneurysm, without rupture, unspecified: Secondary | ICD-10-CM

## 2020-08-22 DIAGNOSIS — Z01812 Encounter for preprocedural laboratory examination: Secondary | ICD-10-CM | POA: Insufficient documentation

## 2020-08-22 DIAGNOSIS — Q231 Congenital insufficiency of aortic valve: Secondary | ICD-10-CM

## 2020-08-22 DIAGNOSIS — I701 Atherosclerosis of renal artery: Secondary | ICD-10-CM

## 2020-08-22 DIAGNOSIS — I10 Essential (primary) hypertension: Secondary | ICD-10-CM

## 2020-08-22 DIAGNOSIS — R931 Abnormal findings on diagnostic imaging of heart and coronary circulation: Secondary | ICD-10-CM

## 2020-08-22 DIAGNOSIS — I350 Nonrheumatic aortic (valve) stenosis: Secondary | ICD-10-CM

## 2020-08-22 LAB — BASIC METABOLIC PANEL
Anion gap: 8 (ref 5–15)
BUN: 23 mg/dL (ref 8–23)
CO2: 27 mmol/L (ref 22–32)
Calcium: 8.8 mg/dL — ABNORMAL LOW (ref 8.9–10.3)
Chloride: 101 mmol/L (ref 98–111)
Creatinine, Ser: 1 mg/dL (ref 0.44–1.00)
GFR, Estimated: >60 mL/min (ref >60)
Glucose, Bld: 148 mg/dL — ABNORMAL HIGH (ref 70–99)
Potassium: 4.2 mmol/L (ref 3.5–5.1)
Sodium: 136 mmol/L (ref 135–145)

## 2020-08-22 LAB — SARS CORONAVIRUS 2 (TAT 6-24 HRS): SARS Coronavirus 2: NEGATIVE

## 2020-08-22 MED ORDER — HYDRALAZINE HCL 10 MG PO TABS
10.0000 mg | ORAL_TABLET | Freq: Three times a day (TID) | ORAL | 3 refills | Status: DC
  Filled 2020-08-22: qty 270, 90d supply, fill #0

## 2020-08-22 NOTE — Progress Notes (Signed)
Office Visit    Patient Name: Courtney Lopez Date of Encounter: 08/22/2020  Primary Care Provider:  Sherlene Shams, MD Primary Cardiologist:  Julien Nordmann, MD  Chief Complaint    60 y/o ? w/ a h/o HTN, L renal artery stenosis, fibromuscular dysplasia, bicuspid Ao valve, moderate AS, ascending Ao aneurysm (4.8cm 12/2019), coronary Ca2+ (90th %'ile), DMII, and thyroid nodules, who presents for f/u related preop eval pending L ankle surgery.  Past Medical History    Past Medical History:  Diagnosis Date  . Ankle fracture 12/31/2018   left  . Anxiety   . Arthritis    neck, back, ankle, knees  . Ascending aortic aneurysm (HCC)    a. 04/2016 CTA: 4.6cm Asc TAA; b. 04/2018 CTA: 4.8cm; c. 10/2018 CTA: 4.9x4.8cm; d. 12/2019 Echo: 4.8 cm.  . Bicuspid aortic valve   . Breast screening, unspecified   . Carotid arterial disease (HCC)    a. 01/2020 U/S: 1-39% bilat ICA stenosis. No evidence of FMD.  . Cervicalgia    cervical stenosis  . Diabetes mellitus without complication (HCC)   . Elevated coronary artery calcium score    a. 03/2016 Cardiac CT: Ca2+ = 78 (90th %'ile).  . Fibromuscular dysplasia (HCC)   . Heart murmur   . History of hiatal hernia   . Metatarsal fracture 2007   5th, from wearing high heels  . Moderate aortic stenosis    a. 12/2019 Echo: EF 60-65%, no rwma, mild LVH, gr1 DD, nl RV size/fxn, triv MR, mild AI, mod AS, mod-sev dil of Asc Ao - 48mm.  AoV reported as tricuspid but prev known to be bicuspid.  . Multiple thyroid nodules    a. 10/2019 U/S: R mid thyroid nodule - f/u 5 yrs.  . Other symptoms involving abdomen and pelvis(789.9)   . Renal artery stenosis (HCC)    a. 12/2019 Renal duplex: Nl RRA. LRA >60% stenosis.  . Renal cyst, right    a. 12/2019 Duplex: 5.8 x 5.7 cm R renal cyst in lower pole.  [name] Retinal hemorrhage   . Symptomatic menopausal or female climacteric states   . Unspecified essential hypertension   . Wears contact lenses    Past  Surgical History:  Procedure Laterality Date  . ABDOMINAL HYSTERECTOMY    . APPENDECTOMY     done during oophorectomy  . CESAREAN SECTION     x 2  . ETHMOIDECTOMY Left 03/02/2020   Procedure: ETHMOIDECTOMY;  Surgeon: Vernie Murders, MD;  Location: [facility];  Service: ENT;  Laterality: Left;  . gyn surgery     hysterectomy- done for adenomysosis and endometriosis  . IMAGE GUIDED SINUS SURGERY N/A 03/02/2020   Procedure: IMAGE GUIDED SINUS SURGERY;  Surgeon: Vernie Murders, MD;  Location: [facility];  Service: ENT;  Laterality: N/A;  need disk PLACED DISK ON OR CHARGE NURSE DESK 9-17 KP  . IMAGE GUIDED SINUS SURGERY Left 04/13/2020   Procedure: IMAGE GUIDED SINUS SURGERY;  Surgeon: Vernie Murders, MD;  Location: [facility];  Service: ENT;  Laterality: Left;  needs stryker disk per Gloriajean Dell use same disk as before (October) put on charge nurses desk. DS 04/03/20  . MAXILLARY ANTROSTOMY Left 03/02/2020   Procedure: MAXILLARY ANTROSTOMY;  Surgeon: Vernie Murders, MD;  Location: [facility];  Service: ENT;  Laterality: Left;  [name] MAXILLARY ANTROSTOMY Left 04/13/2020   Procedure: MAXILLECTOMY;  Surgeon: Vernie Murders, MD;  Location: [facility];  Service: ENT;  Laterality: Left;  .  OOPHORECTOMY     unilateral, due to ruptured ovarian cysts  . OSTEOTOMY MAXILLARY     due to bite problems  . TUBAL LIGATION      Allergies  Allergies  Allergen Reactions  . Hctz [Hydrochlorothiazide] Other (See Comments)    Decreased gfr  . Prozac [Fluoxetine Hcl] Palpitations    History of Present Illness    60 y/o ? w/ a h/o HTN, L renal artery stenosis, fibromuscular dysplasia, bicuspid Ao valve, moderate AS, ascending Ao aneurysm (4.8cm 12/2019), coronary Ca2+ (90th %'ile), DMII, and thyroid nodules.  She has undergone almost annual imaging related to bicuspid aortic valve and ascending aortic aneurysm which has been stable at 4.8 cm by most recent echo in August 2021.   The same echo showed stable, moderate aortic stenosis.  In 2021, in the setting of difficult to manage hypertension with elevation of creatinine on HCTZ therapy (later DC'd), she underwent renal artery stenosis work-up and was found to have a greater than 60% left renal artery stenosis by duplex in August 2021.  Findings were most consistent with fibromuscular dysplasia.  She was seen by Dr. Kirke CorinArida in August 2021, and it was felt that percutaneous intervention should be reserved for uncontrolled hypertension despite 3 different antihypertensive medications.  Amlodipine 5 mg daily was added and blood pressure was noted to be improved at telemedicine follow-up in September 2021, thus intervention was deferred.  Carotid ultrasound showed no significant carotid disease or evidence of FMD in September 2021.    In 04/2020, she noted lower ext edema on amlodipine with leg aching and this was d/c'd in favor of spiro.  Labs in late Dec showed sl worsening of renal fxn, and spriro was d/c'd (creat 1.03 05/12/2020).  F/u creat 06/29/20 - 1.01.  She has thus been taking carvedilol 25 mg twice daily and losartan 100 mg daily.  BPs @ home have since trended generally in the 140s to 150s.  She does use hydralazine 25 mg on as-needed basis several times per week and notes that after using it, pressures improve but then she has subsequent spikes.  She has never tried taking hydralazine in a scheduled fashion.  From a cardiac standpoint, she has done well.  She is reasonably active but is somewhat limited by left ankle pain and laxity which is being evaluated by orthopedics with plan for surgery on the 14th.  She denies chest pain or dyspnea and is easily able to achieve 4 METS.  She has had mild swelling in the left ankle but otherwise denies palpitations, PND, orthopnea, dizziness, syncope, or early satiety.  Home Medications    Prior to Admission medications   Medication Sig Start Date End Date Taking? Authorizing Provider   albuterol (VENTOLIN HFA) 108 (90 Base) MCG/ACT inhaler INHALE 2 PUFFS INTO THE LUNGS EVERY 6 HOURS AS NEEDED FOR WHEEZING OR SHORTNESS OF BREATH. 05/26/20 05/26/21  [name], Teresa L, MD  ALPRAZolam Prudy Feeler(XANAX) 0.25 MG tablet TAKE 1 TABLET BY MOUTH TWICE DAILY AS NEEDED 06/12/20 12/09/20  [name], Teresa L, MD  amoxicillin-clavulanate (AUGMENTIN) 875-125 MG tablet Take 1 tablet by mouth 2 (two) times daily for 7 days 08/15/20   Lurene ShadowPhelps, Erin O, PA-C  carvedilol (COREG) 25 MG tablet TAKE 1 TABLET BY MOUTH TWICE DAILY WITH A MEAL. 05/11/20 05/11/21  [name], Teresa L, MD  cholecalciferol (VITAMIN D3) 25 MCG (1000 UT) tablet Take 2,000 Units by mouth daily.    [provider]  estradiol (ESTRACE) 0.1 MG/GM vaginal cream INSERT 1/4  APPLICATORFUL VAGINALLY TWO TIMES A WEEK AS DIRECTED 10/20/19 10/19/20  Sherlene Shams, MD  fluticasone (FLONASE) 50 MCG/ACT nasal spray PLACE 2 SPRAYS INTO BOTH NOSTRILS DAILY. 05/11/20 05/11/21  Sherlene Shams, MD  guaiFENesin-codeine 100-10 MG/5ML syrup TAKE 5 MLS BY MOUTH 3 TIMES DAILY AS NEEDED FOR COUGH. 05/26/20 11/22/20  Sherlene Shams, MD  hydrALAZINE (APRESOLINE) 25 MG tablet TAKE 1 TABLET BY MOUTH 3 TIMES DAILY AS NEEDED FOR SYSTOLIC BLOOD PRESSURE >140 05/22/20 05/22/21  Iran Ouch, MD  HYDROcodone-acetaminophen (NORCO) 10-325 MG tablet TAKE 1 TABLET BY MOUTH EVERY 6 (SIX) HOURS AS NEEDED FOR SEVERE PAIN. 06/06/20 12/03/20  Sherlene Shams, MD  losartan (COZAAR) 100 MG tablet TAKE 1 TABLET (100 MG TOTAL) BY MOUTH DAILY. 04/03/20 04/03/21  Sherlene Shams, MD  Menthol, Topical Analgesic, (BIOFREEZE EX) Apply topically.    [provider]  metFORMIN (GLUCOPHAGE-XR) 750 MG 24 hr tablet TAKE 1 TABLET BY MOUTH DAILY WITH BREAKFAST. 08/11/20 08/11/21  Sherlene Shams, MD  PREVIDENT 5000 BOOSTER PLUS 1.1 % PSTE Place onto teeth daily. 01/25/20   [provider]  rosuvastatin (CRESTOR) 5 MG tablet Take 1 tablet (5 mg total) by mouth at bedtime. 02/01/20   Iran Ouch, MD  traMADol (ULTRAM) 50 MG tablet TAKE 2 TABLETS (100 MG TOTAL) BY MOUTH EVERY 6 (SIX) HOURS AS NEEDED. 06/06/20 12/03/20  Sherlene Shams, MD  spironolactone (ALDACTONE) 25 MG tablet Take 1 tablet (25 mg total) by mouth daily. 04/19/20 05/22/20  Iran Ouch, MD    Review of Systems    She has some dyspnea if walking up multiple flights of steps but overall this has been stable.  She denies chest pain, palpitations, PND, orthopnea, dizziness, syncope, or early satiety.  Mild left ankle swelling the setting of trauma..  All other systems reviewed and are otherwise negative except as noted above.  Physical Exam    VS:  BP 138/90 (BP Location: Left Arm, Patient Position: Sitting, Cuff Size: Normal)   Pulse 65   Ht 5' 6.5" (1.689 m)   Wt 185 lb 12 oz (84.3 kg)   LMP  (LMP Unknown)   SpO2 98%   BMI 29.53 kg/m  , BMI Body mass index is 29.53 kg/m. GEN: Well nourished, well developed, in no acute distress. HEENT: normal. Neck: Supple, no JVD, carotid bruits, or masses. Cardiac: RRR, 3/6 systolic ejection murmur loudest at the upper sternal borders but heard throughout, no rubs, or gallops. No clubbing, cyanosis, mild left malleoli are edema with tenderness.  Radials/PT 2+ and equal bilaterally.  Respiratory:  Respirations regular and unlabored, clear to auscultation bilaterally. GI: Soft, nontender, nondistended, BS + x 4. MS: no deformity or atrophy. Skin: warm and dry, no rash. Neuro:  Strength and sensation are intact. Psych: Normal affect.  Accessory Clinical Findings    ECG personally reviewed by me today -regular sinus rhythm, 65, left axis deviation - no acute changes.  Lab Results  Component Value Date   WBC 8.2 06/29/2020   HGB 11.8 (L) 06/29/2020   HCT 36.4 06/29/2020   MCV 85.6 06/29/2020   PLT 261.0 06/29/2020   Lab Results  Component Value Date   CREATININE 1.01 06/29/2020   BUN 23 06/29/2020   NA 139 06/29/2020   K 4.0 06/29/2020   CL 103  06/29/2020   CO2 31 06/29/2020   Lab Results  Component Value Date   ALT 15 06/29/2020   AST 15 06/29/2020  ALKPHOS 79 06/29/2020   BILITOT 0.5 06/29/2020   Lab Results  Component Value Date   CHOL 125 06/29/2020   HDL 56.00 06/29/2020   LDLCALC 53 06/29/2020   LDLDIRECT 101.0 11/11/2016   TRIG 77.0 06/29/2020   CHOLHDL 2 06/29/2020    Lab Results  Component Value Date   HGBA1C 6.3 06/29/2020    Assessment & Plan    1.  Preoperative cardiovascular evaluation: Patient with a history of left ankle trauma following fall with ongoing pain and laxity in the ankle requiring use of a brace.  She has been evaluated by orthopedics with plan for left ankle arthroscopic debridement and lateral ligament reconstruction.  This is tentatively scheduled for April 14.  From a cardiac standpoint, despite ankle pain, she is reasonably active and is capable of achieving 8 METS (heavy housework) without symptoms or limitations.  Most recent echocardiogram in August 2021 showed stable, moderate aortic stenosis.  Risk of major cardiac vascular event in the perioperative period is less than 0.4%.  Given good exercise tolerance without symptoms and low risk, she may proceed with ankle surgery without additional cardiovascular evaluation.  I do recommend continuation of antihypertensives and statin therapy throughout the perioperative period.  We will need to watch volume status closely postoperatively.  2.  Moderate aortic stenosis with bicuspid aortic valve: Stable at by echo in August 2021.  Remains active without significant symptoms and denies chest pain, dyspnea, or presyncope.  Follow-up echo later this year.  3.  Essential hypertension: Patient responded well to amlodipine but this caused swelling was discontinued.  She also responded well to spironolactone but this caused slight rise in creatinine this was discontinued earlier this year.  She has been on losartan and carvedilol only and pressures have  been trending in the 140s to 150s.  She has known left renal artery stenosis with plan to defer intervention provided that we can manage blood pressure on 3 agents.  She has been using hydralazine 25 mg as needed with good response but subsequent spikes in blood pressure.  We discussed using hydralazine on a scheduled basis and she is agreeable to trying.  Based on her response to 25 mg on an as-needed basis, I am not sure that she would tolerate that dose once taking 3 times daily.  As result, we will start hydralazine 10 mg 3 times daily.  She will continue to follow blood pressures and contact us if she continues to trend greater than 130 mmHg, at which time we can increase to 25 mg 3 times daily.  4.  Left renal artery stenosis/fibromuscular dysplasia: See above regarding management of hypertension.  Adding hydralazine 10 mg 3 times daily on a scheduled basis today.  Creatinine was stable at 1.01 in February.  5.  Ascending aortic aneurysm: 4.8 cm by echo in August 2021.  We will arrange for follow-up CTA in August of this year with bmet 1 week before.  In the setting of bicuspid aortic valve, she understands that she would be referred to CT surgery at 5 cm.  6.  Coronary calcium: Noted on CT in December 2017 - 78  90th percentile.  She denies chest pain or significant dyspnea on exertion.  She remains on low-dose rosuvastatin therapy with an LDL of 53 in February.  7.  Type 2 diabetes mellitus: On Metformin therapy with an A1c of 6.3 in February.  8.  Disposition: Follow-up CTA of the chest/aorta in August with basic metabolic panel 1 week prior.  Follow-up with Dr. Mariah Milling shortly thereafter.  Nicolasa Ducking, NP 08/22/2020, 8:21 AM

## 2020-08-22 NOTE — Patient Instructions (Addendum)
Medication Instructions:  Your physician has recommended you make the following change in your medication:   1. DECREASE Hydralazine 10 mg three times a day  *If you need a refill on your cardiac medications before your next appointment, please call your pharmacy*   Lab Work: BMET prior to CT in August. Go to [facility] to have this done prior to CT. No appointment is needed and just check in at registration.   If you have labs (blood work) drawn today and your tests are completely normal, you will receive your results only by: [name] MyChart Message (if you have MyChart) OR . A paper copy in the mail If you have any lab test that is abnormal or we need to change your treatment, we will call you to review the results.   Testing/Procedures:  CT Angio for Aorta due in August Call [phone]  Non-Cardiac CT scanning, (CAT scanning), is a noninvasive, special x-ray that produces cross-sectional images of the body using x-rays and a computer. CT scans help physicians diagnose and treat medical conditions. For some CT exams, a contrast material is used to enhance visibility in the area of the body being studied. CT scans provide greater clarity and reveal more details than regular x-ray exams.     Follow-Up: At [facility], you and your health needs are our priority.  As part of our continuing mission to provide you with exceptional heart care, we have created designated Provider Care Teams.  These Care Teams include your primary Cardiologist (physician) and Advanced Practice Providers (APPs -  Physician Assistants and Nurse Practitioners) who all work together to provide you with the care you need, when you need it.   Your next appointment:   4 month(s) after CT is done  The format for your next appointment:   In Person  Provider:   Julien Nordmann, MD or Nicolasa Ducking, NP

## 2020-08-23 ENCOUNTER — Other Ambulatory Visit: Payer: Self-pay

## 2020-08-24 ENCOUNTER — Ambulatory Visit (HOSPITAL_BASED_OUTPATIENT_CLINIC_OR_DEPARTMENT_OTHER): Payer: PRIVATE HEALTH INSURANCE | Admitting: Anesthesiology

## 2020-08-24 ENCOUNTER — Other Ambulatory Visit: Payer: Self-pay

## 2020-08-24 ENCOUNTER — Ambulatory Visit (HOSPITAL_BASED_OUTPATIENT_CLINIC_OR_DEPARTMENT_OTHER)
Admission: RE | Admit: 2020-08-24 | Discharge: 2020-08-24 | Disposition: A | Discharge status: Home / Self Care | Payer: PRIVATE HEALTH INSURANCE | Source: Ambulatory Visit | Attending: Orthopaedic Surgery | Admitting: Orthopaedic Surgery

## 2020-08-24 ENCOUNTER — Encounter (HOSPITAL_BASED_OUTPATIENT_CLINIC_OR_DEPARTMENT_OTHER): Payer: Self-pay | Admitting: Orthopaedic Surgery

## 2020-08-24 ENCOUNTER — Encounter (HOSPITAL_BASED_OUTPATIENT_CLINIC_OR_DEPARTMENT_OTHER)
Admission: RE | Disposition: A | Discharge status: Home / Self Care | Payer: Self-pay | Source: Ambulatory Visit | Attending: Orthopaedic Surgery

## 2020-08-24 DIAGNOSIS — Z90721 Acquired absence of ovaries, unilateral: Secondary | ICD-10-CM | POA: Insufficient documentation

## 2020-08-24 DIAGNOSIS — I712 Thoracic aortic aneurysm, without rupture: Secondary | ICD-10-CM | POA: Insufficient documentation

## 2020-08-24 DIAGNOSIS — Z7984 Long term (current) use of oral hypoglycemic drugs: Secondary | ICD-10-CM | POA: Diagnosis not present

## 2020-08-24 DIAGNOSIS — I10 Essential (primary) hypertension: Secondary | ICD-10-CM | POA: Insufficient documentation

## 2020-08-24 DIAGNOSIS — Z90710 Acquired absence of both cervix and uterus: Secondary | ICD-10-CM | POA: Insufficient documentation

## 2020-08-24 DIAGNOSIS — Z833 Family history of diabetes mellitus: Secondary | ICD-10-CM | POA: Diagnosis not present

## 2020-08-24 DIAGNOSIS — Z79899 Other long term (current) drug therapy: Secondary | ICD-10-CM | POA: Insufficient documentation

## 2020-08-24 DIAGNOSIS — Z8249 Family history of ischemic heart disease and other diseases of the circulatory system: Secondary | ICD-10-CM | POA: Diagnosis not present

## 2020-08-24 DIAGNOSIS — Z888 Allergy status to other drugs, medicaments and biological substances status: Secondary | ICD-10-CM | POA: Diagnosis not present

## 2020-08-24 DIAGNOSIS — M25372 Other instability, left ankle: Secondary | ICD-10-CM | POA: Diagnosis present

## 2020-08-24 DIAGNOSIS — M19072 Primary osteoarthritis, left ankle and foot: Secondary | ICD-10-CM | POA: Diagnosis not present

## 2020-08-24 DIAGNOSIS — E119 Type 2 diabetes mellitus without complications: Secondary | ICD-10-CM | POA: Insufficient documentation

## 2020-08-24 HISTORY — DX: Anxiety disorder, unspecified: F41.9

## 2020-08-24 HISTORY — DX: Personal history of other diseases of the digestive system: Z87.19

## 2020-08-24 HISTORY — PX: ANKLE ARTHROSCOPY WITH RECONSTRUCTION: SHX5583

## 2020-08-24 HISTORY — DX: Unspecified osteoarthritis, unspecified site: M19.90

## 2020-08-24 LAB — GLUCOSE, CAPILLARY
Glucose-Capillary: 125 mg/dL — ABNORMAL HIGH (ref 70–99)
Glucose-Capillary: 159 mg/dL — ABNORMAL HIGH (ref 70–99)

## 2020-08-24 SURGERY — ARTHROSCOPY, ANKLE, WITH RECONSTRUCTION
Anesthesia: Regional | Site: Ankle | Laterality: Left

## 2020-08-24 MED ORDER — LACTATED RINGERS IV SOLN: INTRAVENOUS | Status: DC

## 2020-08-24 MED ORDER — ONDANSETRON HCL 4 MG/2ML IJ SOLN
INTRAMUSCULAR | Status: DC | PRN
  Administered 2020-08-24: 4 mg via INTRAVENOUS

## 2020-08-24 MED ORDER — MIDAZOLAM HCL 2 MG/2ML IJ SOLN
1.0000 mg | Freq: Once | INTRAMUSCULAR | Status: AC
  Administered 2020-08-24: 2 mg via INTRAVENOUS

## 2020-08-24 MED ORDER — HYDRALAZINE HCL 20 MG/ML IJ SOLN
INTRAMUSCULAR | Status: AC
  Filled 2020-08-24: qty 1

## 2020-08-24 MED ORDER — DEXAMETHASONE SODIUM PHOSPHATE 10 MG/ML IJ SOLN
INTRAMUSCULAR | Status: AC
  Filled 2020-08-24: qty 1

## 2020-08-24 MED ORDER — MIDAZOLAM HCL 5 MG/5ML IJ SOLN
INTRAMUSCULAR | Status: DC | PRN
  Administered 2020-08-24: 2 mg via INTRAVENOUS

## 2020-08-24 MED ORDER — LIDOCAINE 2% (20 MG/ML) 5 ML SYRINGE
INTRAMUSCULAR | Status: AC
  Filled 2020-08-24: qty 5

## 2020-08-24 MED ORDER — LIDOCAINE HCL (CARDIAC) PF 100 MG/5ML IV SOSY
PREFILLED_SYRINGE | INTRAVENOUS | Status: DC | PRN
  Administered 2020-08-24: 40 mg via INTRAVENOUS

## 2020-08-24 MED ORDER — PHENYLEPHRINE HCL-NACL 10-0.9 MG/250ML-% IV SOLN
INTRAVENOUS | Status: DC | PRN
  Administered 2020-08-24: 30 ug/min via INTRAVENOUS

## 2020-08-24 MED ORDER — BUPIVACAINE HCL (PF) 0.5 % IJ SOLN
INTRAMUSCULAR | Status: AC
  Filled 2020-08-24: qty 30

## 2020-08-24 MED ORDER — EPHEDRINE 5 MG/ML INJ
INTRAVENOUS | Status: AC
  Filled 2020-08-24: qty 10

## 2020-08-24 MED ORDER — MIDAZOLAM HCL 2 MG/2ML IJ SOLN
INTRAMUSCULAR | Status: AC
  Filled 2020-08-24: qty 2

## 2020-08-24 MED ORDER — GLYCOPYRROLATE 0.2 MG/ML IJ SOLN
INTRAMUSCULAR | Status: DC | PRN
  Administered 2020-08-24 (×2): .1 mg via INTRAVENOUS

## 2020-08-24 MED ORDER — ACETAMINOPHEN 500 MG PO TABS
1000.0000 mg | ORAL_TABLET | Freq: Once | ORAL | Status: AC
  Administered 2020-08-24: 1000 mg via ORAL

## 2020-08-24 MED ORDER — EPHEDRINE SULFATE 50 MG/ML IJ SOLN
INTRAMUSCULAR | Status: DC | PRN
  Administered 2020-08-24: 15 mg via INTRAVENOUS
  Administered 2020-08-24: 10 mg via INTRAVENOUS
  Administered 2020-08-24 (×3): 5 mg via INTRAVENOUS
  Administered 2020-08-24: 10 mg via INTRAVENOUS

## 2020-08-24 MED ORDER — FENTANYL CITRATE (PF) 100 MCG/2ML IJ SOLN
50.0000 ug | Freq: Once | INTRAMUSCULAR | Status: AC
  Administered 2020-08-24: 50 ug via INTRAVENOUS

## 2020-08-24 MED ORDER — HYDRALAZINE HCL 20 MG/ML IJ SOLN
10.0000 mg | Freq: Once | INTRAMUSCULAR | Status: AC
  Administered 2020-08-24: 10 mg via INTRAVENOUS

## 2020-08-24 MED ORDER — OXYCODONE HCL 5 MG PO TABS
5.0000 mg | ORAL_TABLET | ORAL | 0 refills | Status: AC | PRN
  Filled 2020-08-24: qty 40, 7d supply, fill #0

## 2020-08-24 MED ORDER — FENTANYL CITRATE (PF) 100 MCG/2ML IJ SOLN
INTRAMUSCULAR | Status: AC
  Filled 2020-08-24: qty 2

## 2020-08-24 MED ORDER — FENTANYL CITRATE (PF) 100 MCG/2ML IJ SOLN
INTRAMUSCULAR | Status: DC | PRN
  Administered 2020-08-24 (×2): 25 ug via INTRAVENOUS

## 2020-08-24 MED ORDER — ROPIVACAINE HCL 5 MG/ML IJ SOLN
INTRAMUSCULAR | Status: DC | PRN
  Administered 2020-08-24: 15 mL via PERINEURAL
  Administered 2020-08-24: 30 mL via PERINEURAL

## 2020-08-24 MED ORDER — FENTANYL CITRATE (PF) 100 MCG/2ML IJ SOLN: 25.0000 ug | INTRAMUSCULAR | Status: DC | PRN

## 2020-08-24 MED ORDER — DEXAMETHASONE SODIUM PHOSPHATE 10 MG/ML IJ SOLN
INTRAMUSCULAR | Status: DC | PRN
  Administered 2020-08-24: 8 mg
  Administered 2020-08-24 (×2): 5 mg

## 2020-08-24 MED ORDER — PROPOFOL 10 MG/ML IV BOLUS
INTRAVENOUS | Status: DC | PRN
  Administered 2020-08-24: 20 mg via INTRAVENOUS
  Administered 2020-08-24: 150 mg via INTRAVENOUS
  Administered 2020-08-24: 20 mg via INTRAVENOUS
  Administered 2020-08-24: 100 ug/kg/min via INTRAVENOUS

## 2020-08-24 MED ORDER — GLYCOPYRROLATE PF 0.2 MG/ML IJ SOSY
PREFILLED_SYRINGE | INTRAMUSCULAR | Status: AC
  Filled 2020-08-24: qty 1

## 2020-08-24 MED ORDER — 0.9 % SODIUM CHLORIDE (POUR BTL) OPTIME
TOPICAL | Status: DC | PRN
  Administered 2020-08-24 (×2): 3000 mL

## 2020-08-24 MED ORDER — ONDANSETRON HCL 4 MG/2ML IJ SOLN
INTRAMUSCULAR | Status: AC
  Filled 2020-08-24: qty 2

## 2020-08-24 MED ORDER — CEFAZOLIN SODIUM-DEXTROSE 2-4 GM/100ML-% IV SOLN
INTRAVENOUS | Status: AC
  Filled 2020-08-24: qty 100

## 2020-08-24 MED ORDER — CEFAZOLIN SODIUM-DEXTROSE 2-4 GM/100ML-% IV SOLN
2.0000 g | INTRAVENOUS | Status: AC
  Administered 2020-08-24: 2 g via INTRAVENOUS

## 2020-08-24 SURGICAL SUPPLY — 82 items
ANCH SUT 3 SUTTAPE 2 LD (Anchor) ×2 IMPLANT
APL PRP STRL LF DISP 70% ISPRP (MISCELLANEOUS) ×2
APL SKNCLS STERI-STRIP NONHPOA (GAUZE/BANDAGES/DRESSINGS)
BANDAGE ESMARK 6X9 LF (GAUZE/BANDAGES/DRESSINGS) IMPLANT
BENZOIN TINCTURE PRP APPL 2/3 (GAUZE/BANDAGES/DRESSINGS) IMPLANT
BLADE SURG 15 STRL LF DISP TIS (BLADE) ×1 IMPLANT
BLADE SURG 15 STRL SS (BLADE) ×2
BNDG CMPR 9X4 STRL LF SNTH (GAUZE/BANDAGES/DRESSINGS)
BNDG CMPR 9X6 STRL LF SNTH (GAUZE/BANDAGES/DRESSINGS)
BNDG ELASTIC 6X5.8 VLCR STR LF (GAUZE/BANDAGES/DRESSINGS) ×2 IMPLANT
BNDG ESMARK 4X9 LF (GAUZE/BANDAGES/DRESSINGS) IMPLANT
BNDG ESMARK 6X9 LF (GAUZE/BANDAGES/DRESSINGS)
CHLORAPREP W/TINT 26 (MISCELLANEOUS) ×4 IMPLANT
COVER WAND RF STERILE (DRAPES) IMPLANT
CUFF TOURN SGL QUICK 34 (TOURNIQUET CUFF) ×2
CUFF TRNQT CYL 34X4.125X (TOURNIQUET CUFF) ×1 IMPLANT
DECANTER SPIKE VIAL GLASS SM (MISCELLANEOUS) IMPLANT
DRAPE EXTREMITY T 121X128X90 (DISPOSABLE) ×2 IMPLANT
DRAPE OEC MINIVIEW 54X84 (DRAPES) IMPLANT
DRAPE U-SHAPE 47X51 STRL (DRAPES) ×2 IMPLANT
DRSG PAD ABDOMINAL 8X10 ST (GAUZE/BANDAGES/DRESSINGS) ×2 IMPLANT
ELECT REM PT RETURN 9FT ADLT (ELECTROSURGICAL)
ELECTRODE REM PT RTRN 9FT ADLT (ELECTROSURGICAL) IMPLANT
EXCALIBUR 3.8MM X 13CM (MISCELLANEOUS) ×2 IMPLANT
GAUZE SPONGE 4X4 12PLY STRL (GAUZE/BANDAGES/DRESSINGS) ×2 IMPLANT
GAUZE XEROFORM 1X8 LF (GAUZE/BANDAGES/DRESSINGS) ×2 IMPLANT
GLOVE SRG 8 PF TXTR STRL LF DI (GLOVE) ×1 IMPLANT
GLOVE SURG ENC MOIS LTX SZ6.5 (GLOVE) ×6 IMPLANT
GLOVE SURG ENC MOIS LTX SZ7.5 (GLOVE) ×2 IMPLANT
GLOVE SURG ENC TEXT LTX SZ7.5 (GLOVE) ×4 IMPLANT
GLOVE SURG UNDER POLY LF SZ6.5 (GLOVE) ×4 IMPLANT
GLOVE SURG UNDER POLY LF SZ7 (GLOVE) ×2 IMPLANT
GLOVE SURG UNDER POLY LF SZ8 (GLOVE) ×2
GOWN STRL REUS W/ TWL LRG LVL3 (GOWN DISPOSABLE) ×3 IMPLANT
GOWN STRL REUS W/ TWL XL LVL3 (GOWN DISPOSABLE) ×1 IMPLANT
GOWN STRL REUS W/TWL LRG LVL3 (GOWN DISPOSABLE) ×6
GOWN STRL REUS W/TWL XL LVL3 (GOWN DISPOSABLE) ×2
IV NS IRRIG 3000ML ARTHROMATIC (IV SOLUTION) ×4 IMPLANT
KIT SUTURETAK 2.4 DRILL BIT (KITS) ×2 IMPLANT
MANIFOLD NEPTUNE II (INSTRUMENTS) ×2 IMPLANT
NDL SUT 6 .5 CRC .975X.05 MAYO (NEEDLE) IMPLANT
NEEDLE KEITH (NEEDLE) IMPLANT
NEEDLE MAYO TAPER (NEEDLE)
NS IRRIG 1000ML POUR BTL (IV SOLUTION) ×2 IMPLANT
PACK ARTHROSCOPY DSU (CUSTOM PROCEDURE TRAY) ×2 IMPLANT
PACK BASIN DAY SURGERY FS (CUSTOM PROCEDURE TRAY) ×2 IMPLANT
PADDING CAST ABS 4INX4YD NS (CAST SUPPLIES) ×1
PADDING CAST ABS COTTON 4X4 ST (CAST SUPPLIES) ×1 IMPLANT
PADDING CAST SYNTHETIC 4 (CAST SUPPLIES) ×4
PADDING CAST SYNTHETIC 4X4 STR (CAST SUPPLIES) ×4 IMPLANT
PENCIL SMOKE EVACUATOR (MISCELLANEOUS) ×2 IMPLANT
SHAVER DISSECTOR 3.0 (BURR) IMPLANT
SHAVER SABRE 2.0 (BURR) IMPLANT
SHEET MEDIUM DRAPE 40X70 STRL (DRAPES) ×2 IMPLANT
SLEEVE SCD COMPRESS KNEE MED (STOCKING) ×2 IMPLANT
SPLINT FAST PLASTER 5X30 (CAST SUPPLIES) ×20
SPLINT PLASTER CAST FAST 5X30 (CAST SUPPLIES) ×20 IMPLANT
SPONGE LAP 18X18 RF (DISPOSABLE) IMPLANT
STOCKINETTE 6  STRL (DRAPES) ×1
STOCKINETTE 6 STRL (DRAPES) ×1 IMPLANT
STRAP ANKLE FOOT DISTRACTOR (ORTHOPEDIC SUPPLIES) ×2 IMPLANT
STRIP CLOSURE SKIN 1/2X4 (GAUZE/BANDAGES/DRESSINGS) IMPLANT
SUCTION FRAZIER HANDLE 10FR (MISCELLANEOUS) ×1
SUCTION TUBE FRAZIER 10FR DISP (MISCELLANEOUS) ×1 IMPLANT
SUT BONE WAX W31G (SUTURE) ×2 IMPLANT
SUT ETHILON 3 0 PS 1 (SUTURE) ×2 IMPLANT
SUT FIBERWIRE #2 38 T-5 BLUE (SUTURE)
SUT FIBERWIRE 2-0 18 17.9 3/8 (SUTURE)
SUT MNCRL AB 3-0 PS2 18 (SUTURE) ×2 IMPLANT
SUT PDS AB 2-0 CT2 27 (SUTURE) ×2 IMPLANT
SUT VIC AB 0 SH 27 (SUTURE) IMPLANT
SUT VIC AB 2-0 SH 27 (SUTURE) ×2
SUT VIC AB 2-0 SH 27XBRD (SUTURE) ×1 IMPLANT
SUT VIC AB 3-0 FS2 27 (SUTURE) IMPLANT
SUTURE FIBERWR #2 38 T-5 BLUE (SUTURE) IMPLANT
SUTURE FIBERWR 2-0 18 17.9 3/8 (SUTURE) IMPLANT
SUTURE TAPE 3.0 DBL LOAD S-TAK (Anchor) ×2 IMPLANT
SUTURETAPE 3.0 DBL LOAD S-TAK (Anchor) ×4 IMPLANT
SYR BULB EAR ULCER 3OZ GRN STR (SYRINGE) ×2 IMPLANT
TOWEL GREEN STERILE FF (TOWEL DISPOSABLE) ×4 IMPLANT
TUBING ARTHROSCOPY IRRIG 16FT (MISCELLANEOUS) ×2 IMPLANT
UNDERPAD 30X36 HEAVY ABSORB (UNDERPADS AND DIAPERS) ×2 IMPLANT

## 2020-08-24 NOTE — Anesthesia Procedure Notes (Signed)
Anesthesia Regional Block: Popliteal block   Pre-Anesthetic Checklist: ,, timeout performed, Correct Patient, Correct Site, Correct Laterality, Correct Procedure, Correct Position, site marked, Risks and benefits discussed,  Surgical consent,  Pre-op evaluation,  At surgeon's request and post-op pain management  Laterality: Left  Prep: Maximum Sterile Barrier Precautions used, chloraprep       Needles:  Injection technique: Single-shot  Needle Type: Echogenic Stimulator Needle     Needle Length: 9cm  Needle Gauge: 22     Additional Needles:   Procedures:,,,, ultrasound used (permanent image in chart),,,,  Narrative:  Start time: 08/24/2020 12:14 PM End time: 08/24/2020 12:24 PM Injection made incrementally with aspirations every 5 mL.  Performed by: Personally  Anesthesiologist: Elmer Picker, MD  Additional Notes: Monitors applied. No increased pain on injection. No increased resistance to injection. Injection made in 5cc increments. Good needle visualization. Patient tolerated procedure well.

## 2020-08-24 NOTE — Op Note (Signed)
Courtney Lopez female 60 y.o. 08/24/2020  PreOperative Diagnosis: Left ankle arthritis Left ankle impingement Left ankle talar dome osteochondral lesion Left ankle instability   PostOperative Diagnosis: Left ankle arthritis Left ankle impingement Left ankle talar dome osteochondral lesion Distal fibular exostoses Left ankle instability  PROCEDURE: Left ankle arthroscopic debridement, extensive Left ankle arthroscopic treatment of osteochondral lesion talus Partial resection of distal fibula Lateral ligament reconstruction  SURGEON: Dub Mikes, MD  ASSISTANT:  Danielle Rankin, OPA-C  (Present and scrubbed throughout the case, critical for assistance with exposure, retraction, placement of implants, and closure.)     ANESTHESIA: General with peripheral nerve block  FINDINGS: Left ankle chondrosis with significant mount of anterolateral and medial scar tissue consistent with ankle impingement Medial talar dome osteochondral lesion into the medial gutter with full-thickness cartilage and bony loss Left ankle lateral ligament instability  IMPLANTS: Arthrex suture tack  INDICATIONS: She sustained an ankle injury in August 2020 while at work.  She had a nondisplaced fibular fracture.  This healed uneventfully.  She then had a reinjury that was outside of her normal work injury but had continued pain.  MRI demonstrated ligament incontinuity as well as medial talar dome osteochondral lesion.  She had continued pain despite conservative treatment the form of boot immobilization, anti-inflammatories, ankle injections, physical therapy.  MRI demonstrated osteochondral lesion of the lateral talar dome and ligamentous insufficiency.  Patient understood the risks, benefits and alternatives of surgery which include but not limited to wound healing complications, infection, nonunion, malunion, need for further surgery and damage to surrounding structures.  Patient also understood  the possibility of progressive arthritis and continued pain.  After weighing the risks she elected to proceed with surgery.  PROCEDURE: Patient was identified in the preoperative holding area.  The  left leg was marked myself.  The consent was signed myself and the patient.  Peripheral nerve blockade was performed by anesthesia. Patient was taken the operative suite placed supine operative table.  Thigh tourniquet was placed on the operative thigh after general LMA anesthesia was induced without difficulty.  Preoperative antibiotics were given.  A bump was placed on the left hip and all bony prominences were well-padded.  The operative extremity was prepped and draped in the usual sterile fashion.  Surgical timeout was performed.  We began by elevating the limb and inflating the tourniquet to 250 mmHg.  The ankle distractor was placed.   Ankle arthoscopic debridement, extensive: We began by using an 18-gauge needle to insufflate the joint about the medial joint line just medial to the tibialis anterior tendon.  We then began by making an anteromedial portal to the ankle joint.  This was done with an 11 blade through the skin.  Then blunt dissection was used with a hemostat down to the capsule.  Then the capsule was violated and the joint entered.  Then the trocar with the camera was placed.   Upon inspection of the joint there was found to be significant amount of synovitis and torn ligamentous tissue laterally.  There is a large loose body on the anteromedial aspect of the ankle joint that was encased in surrounding scar tissue but was loose and floating within the synovial fluid.  There is evidence of full-thickness osteochondral lesion of the medial talar dome on the medial side of the talus as well as the medial shoulder.  There was evidence of cartilaginous wear on the tibial plafond within the medial shoulder as well.  Then the probe was removed and  the shaver was inserted.  Extensive debridement was  done of the ankle joint including synovial tissue the loose bony fragments and osteophyte on the anterior distal tibial follow-up plafond.    The loose portions of the cartilage were debrided extensively with the shaver about the anterior portion of the distal tibia.  Bone was debrided.  Treatment of osteochondral lesion of talus: We then turned our attention to the osteochondral lesion of the medial talar dome.  Probe was used to identify the loose cartilage fragments.  All portions of the loose cartilage that were unable to be adequately debrided with a shaver were debrided with a curette used back to a stable cartilage base. There was a notable full-thickness osteochondral defect of the lateral talar dome and the loose and delaminated portions of cartilage and bone were removed with a curette.  Then using a curette the subchondral bony surfaces were roughened back to good healthy bleeding bone. There was evidence of bony bleeding at the site of the lesion. The joint surfaces were inspected for any evidence of loose cartilage.  There was some remaining chondrosis notably within the lateral gutter and of the lateral anterior tibial plafond.  This was debrided extensively. This completed the arthroscopic debridement portion of the case.  The ankle distractor was then removed.  The ankle was stressed and found to be unstable with regard to lateral ligament complex.  The medial ligaments and syndesmosis appeared intact.   Partial excision/craterization of distal fibula: We then turned our attention to the lateral ligaments.  A longitudinal incision in an oblique fashion was made overlying the lateral ligament complex.  This taken sharply down through skin and subcutaneous tissue.  Subcuticular fat was mobilized anteriorly and posteriorly.  Then the extensor retinacular tissue was identified as well as as the distal fibula.  An incision was made through the remaining lateral ligament tissue at the tip of the  fibula in a curvilinear fashion.  Then the tissue was inspected and found to be incompetent.  The distal aspect of the fibula was cleared back to a bleeding surface using a rondure and a knife.  There was exophytic component of the distal fibula and a rondure was used to partially remove the most distal and anterior portion of the distal fibula.  This was craterizerd back to bleeding bony surface to allow for good ligament healing.  Lateral ligament reconstruction: A separate deep incision was performed anteriorly to fully visualize the lateral ligament complex and the extensor retinacular tissue.  Once the extensor retinacular tissue was identified and the ligamentous complex deemed incompetent we placed 2 Arthrex suture tacks within the distal fibula.   Then the suture tacks and attached suture were passed through the remaining lateral ligaments incorporating the extensor retinaculum.   The ligaments were then recontsructed by tying down the suture anchors.  Then the ankle was assessed for stability and found to be stable.  The wound was irrigated copiously with normal saline.  The tourniquet was released and hemostasis was obtained.    The wounds were closed in a layered fashion using  3-0 Monocryl and the skin with a 3-0 nylon.  The portals were closed with a 3-0 nylon.   The leg was cleaned and the wounds were covered with Xeroform and a soft dressing.   A nonweightbearing short leg splint was placed.  Patient was awakened from anesthesia and taken recovery in stable condition.  All counts were correct at the end the case.  There was no  complications.   POST OPERATIVE INSTRUCTIONS: Keep splint dry and in place Nonweightbearing to right lower extremity Call the office with concerns Follow-up in 2 weeks for splint removal, suture removal and likely placement of a nonweightbearing cast. No need for DVT prophylaxis.  TOURNIQUET TIME: Less than 2 hours  BLOOD LOSS:  Minimal         DRAINS: none          SPECIMEN: none       COMPLICATIONS:  * No complications entered in OR log *         Disposition: PACU - hemodynamically stable.         Condition: stable

## 2020-08-24 NOTE — Anesthesia Procedure Notes (Signed)
Anesthesia Regional Block: Adductor canal block   Pre-Anesthetic Checklist: ,, timeout performed, Correct Patient, Correct Site, Correct Laterality, Correct Procedure, Correct Position, site marked, Risks and benefits discussed,  Surgical consent,  Pre-op evaluation,  At surgeon's request and post-op pain management  Laterality: Left  Prep: Maximum Sterile Barrier Precautions used, chloraprep       Needles:  Injection technique: Single-shot  Needle Type: Echogenic Stimulator Needle     Needle Length: 9cm  Needle Gauge: 22     Additional Needles:   Procedures:,,,, ultrasound used (permanent image in chart),,,,  Narrative:  Start time: 08/24/2020 12:24 PM End time: 08/24/2020 12:27 PM Injection made incrementally with aspirations every 5 mL.  Performed by: Personally  Anesthesiologist: Elmer Picker, MD  Additional Notes: Monitors applied. No increased pain on injection. No increased resistance to injection. Injection made in 5cc increments. Good needle visualization. Patient tolerated procedure well.

## 2020-08-24 NOTE — Progress Notes (Signed)
Assisted Dr. Woodrum with left, ultrasound guided, popliteal, adductor canal block. Side rails up, monitors on throughout procedure. See vital signs in flow sheet. Tolerated Procedure well. °

## 2020-08-24 NOTE — Discharge Instructions (Signed)
DR. Susa Simmonds FOOT & ANKLE SURGERY POST-OP INSTRUCTIONS   Pain Management 1. The numbing medicine and your leg will last around 18 hours, take a dose of your pain medicine as soon as you feel it wearing off to avoid rebound pain. 2. Keep your foot elevated above heart level.  Make sure that your heel hangs free ('floats'). 3. Take all prescribed medication as directed. 4. If taking narcotic pain medication you may want to use an over-the-counter stool softener to avoid constipation. 5. You may take over-the-counter NSAIDs (ibuprofen, naproxen, etc.) as well as over-the-counter acetaminophen as directed on the packaging as a supplement for your pain and may also use it to wean away from the prescription medication.  Activity ? Non-weightbearing ? Keep splint intact  First Postoperative Visit 1. Your first postop visit will be at least 2 weeks after surgery.  This should be scheduled when you schedule surgery. 2. If you do not have a postoperative visit scheduled please call [phone] to schedule an appointment. 3. At the appointment your incision will be evaluated for suture removal, x-rays will be obtained if necessary.  General Instructions 1. Swelling is very common after foot and ankle surgery.  It often takes 3 months for the foot and ankle to begin to feel comfortable.  Some amount of swelling will persist for 6-12 months. 2. DO NOT change the dressing.  If there is a problem with the dressing (too tight, loose, gets wet, etc.) please contact Dr. Donnie Mesa office. 3. DO NOT get the dressing wet.  For showers you can use an over-the-counter cast cover or wrap a washcloth around the top of your dressing and then cover it with a plastic bag and tape it to your leg. 4. DO NOT soak the incision (no tubs, pools, bath, etc.) until you have approval from Dr. Susa Simmonds.  Contact Dr. Garret Reddish office or go to Emergency Room if: 1. Temperature above 101 F. 2. Increasing pain that is unresponsive to pain  medication or elevation 3. Excessive redness or swelling in your foot 4. Dressing problems - excessive bloody drainage, looseness or tightness, or if dressing gets wet 5. Develop pain, swelling, warmth, or discoloration of your calf   No Tylenol before 5:00pm   Post Anesthesia Home Care Instructions  Activity: Get plenty of rest for the remainder of the day. A responsible individual must stay with you for 24 hours following the procedure.  For the next 24 hours, DO NOT: -Drive a car -Advertising copywriter -Drink alcoholic beverages -Take any medication unless instructed by your physician -Make any legal decisions or sign important papers.  Meals: Start with liquid foods such as gelatin or soup. Progress to regular foods as tolerated. Avoid greasy, spicy, heavy foods. If nausea and/or vomiting occur, drink only clear liquids until the nausea and/or vomiting subsides. Call your physician if vomiting continues.  Special Instructions/Symptoms: Your throat may feel dry or sore from the anesthesia or the breathing tube placed in your throat during surgery. If this causes discomfort, gargle with warm salt water. The discomfort should disappear within 24 hours.  If you had a scopolamine patch placed behind your ear for the management of post- operative nausea and/or vomiting:  1. The medication in the patch is effective for 72 hours, after which it should be removed.  Wrap patch in a tissue and discard in the trash. Wash hands thoroughly with soap and water. 2. You may remove the patch earlier than 72 hours if you experience unpleasant side effects  which may include dry mouth, dizziness or visual disturbances. 3. Avoid touching the patch. Wash your hands with soap and water after contact with the patch.     Regional Anesthesia Blocks  1. Numbness or the inability to move the "blocked" extremity may last from 3-48 hours after placement. The length of time depends on the medication injected and  your individual response to the medication. If the numbness is not going away after 48 hours, call your surgeon.  2. The extremity that is blocked will need to be protected until the numbness is gone and the  Strength has returned. Because you cannot feel it, you will need to take extra care to avoid injury. Because it may be weak, you may have difficulty moving it or using it. You may not know what position it is in without looking at it while the block is in effect.  3. For blocks in the legs and feet, returning to weight bearing and walking needs to be done carefully. You will need to wait until the numbness is entirely gone and the strength has returned. You should be able to move your leg and foot normally before you try and bear weight or walk. You will need someone to be with you when you first try to ensure you do not fall and possibly risk injury.  4. Bruising and tenderness at the needle site are common side effects and will resolve in a few days.  5. Persistent numbness or new problems with movement should be communicated to the surgeon or the [facility] [phone] [facility] [phone]).

## 2020-08-24 NOTE — Transfer of Care (Signed)
Immediate Anesthesia Transfer of Care Note  Patient: Marcheta Horsey Ehrmann  Procedure(s) Performed: LEFT ANKLE ARTHROSCOPIC DEBRIDEMENT, EXTENSIVE, ARTHOSCROPIC TREATMENT OF TALUS OSTEOCHONDRAL LESION, LATERAL LIGAMENT RECONSTRUCTION (Left Ankle)  Patient Location: PACU  Anesthesia Type:General and Regional  Level of Consciousness: drowsy  Airway & Oxygen Therapy: Patient Spontanous Breathing and Patient connected to face mask oxygen  Post-op Assessment: Report given to RN and Post -op Vital signs reviewed and stable  Post vital signs: Reviewed and stable  Last Vitals:  Vitals Value Taken Time  BP    Temp    Pulse 84 08/24/20 1526  Resp 11 08/24/20 1526  SpO2 100 % 08/24/20 1526  Vitals shown include unvalidated device data.  Last Pain:  Vitals:   08/24/20 1056  TempSrc: Oral  PainSc: 2       Patients Stated Pain Goal: 5 (08/24/20 1056)  Complications: No complications documented.

## 2020-08-24 NOTE — Anesthesia Preprocedure Evaluation (Addendum)
Anesthesia Evaluation  Patient identified by MRN, date of birth, ID band Patient awake    Reviewed: Allergy & Precautions, NPO status , Patient's Chart, lab work & pertinent test results, reviewed documented beta blocker date and time   Airway Mallampati: I  TM Distance: >3 FB Neck ROM: Full    Dental no notable dental hx. (+) Teeth Intact, Dental Advisory Given   Pulmonary neg pulmonary ROS,    Pulmonary exam normal breath sounds clear to auscultation       Cardiovascular hypertension, Pt. on home beta blockers and Pt. on medications + CAD and + Peripheral Vascular Disease  Normal cardiovascular exam+ Valvular Problems/Murmurs AS  Rhythm:Regular Rate:Normal  Known thoracic aortic aneurysm  TTE 2021 1. Left ventricular ejection fraction, by estimation, is 60 to 65%. The  left ventricle has normal function. The left ventricle has no regional  wall motion abnormalities. There is mild left ventricular hypertrophy.  Left ventricular diastolic parameters  are consistent with Grade I diastolic dysfunction (impaired relaxation).  2. Right ventricular systolic function is normal. The right ventricular  size is normal. There is normal pulmonary artery systolic pressure.  3. The mitral valve is normal in structure. Trivial mitral valve  regurgitation.  4. The aortic valve is tricuspid. Aortic valve regurgitation is mild.  Moderate aortic valve stenosis. Aortic valve mean gradient measures 22.5  mmHg.  5. Aortic dilatation noted. There is moderate to severe dilatation of the  ascending aorta measuring 48 mm.  6. The inferior vena cava is normal in size with greater than 50%  respiratory variability, suggesting right atrial pressure of 3 mmHg.   Neuro/Psych PSYCHIATRIC DISORDERS Anxiety negative neurological ROS     GI/Hepatic Neg liver ROS, hiatal hernia,   Endo/Other  negative endocrine ROSdiabetes, Type 2, Oral Hypoglycemic  Agents  Renal/GU negative Renal ROS  negative genitourinary   Musculoskeletal  (+) Arthritis ,   Abdominal   Peds  Hematology negative hematology ROS (+)   Anesthesia Other Findings   Reproductive/Obstetrics                            Anesthesia Physical Anesthesia Plan  ASA: III  Anesthesia Plan: General and Regional   Post-op Pain Management:  Regional for Post-op pain   Induction: Intravenous  PONV Risk Score and Plan: 3 and Ondansetron, Dexamethasone and Midazolam  Airway Management Planned: LMA  Additional Equipment:   Intra-op Plan:   Post-operative Plan: Extubation in OR  Informed Consent: I have reviewed the patients History and Physical, chart, labs and discussed the procedure including the risks, benefits and alternatives for the proposed anesthesia with the patient or authorized representative who has indicated his/her understanding and acceptance.     Dental advisory given  Plan Discussed with: CRNA  Anesthesia Plan Comments:         Anesthesia Quick Evaluation

## 2020-08-24 NOTE — Anesthesia Procedure Notes (Addendum)
Procedure Name: LMA Insertion Performed by: Agostino Gorin S, CRNA Pre-anesthesia Checklist: Patient identified, Emergency Drugs available, Suction available and Patient being monitored Patient Re-evaluated:Patient Re-evaluated prior to induction Oxygen Delivery Method: Circle System Utilized Preoxygenation: Pre-oxygenation with 100% oxygen Induction Type: IV induction Ventilation: Mask ventilation without difficulty LMA: LMA inserted LMA Size: 4.0 Number of attempts: 1 Airway Equipment and Method: Bite block Placement Confirmation: positive ETCO2 Tube secured with: Tape Dental Injury: Teeth and Oropharynx as per pre-operative assessment        

## 2020-08-24 NOTE — H&P (Signed)
PREOPERATIVE H&P  Chief Complaint: Left ankle pain  HPI: Courtney Lopez is a 60 y.o. female who presents for preoperative history and physical with a diagnosis of left ankle instability with talus osteochondral lesion she initially stained an injury back in August 2020 where she had a nondisplaced fracture of her fibula.  She healed this uneventfully but then had a reinjury approximately 1 year later with resultant instability despite conservative treatment.  X-rays and MRI scan revealed an osteochondral lesion on the medial talar dome.  Prior x-rays from several years ago demonstrated similar lesion but not as severe. Symptoms are rated as moderate to severe, and have been worsening.  This is significantly impairing activities of daily living.  She has elected for surgical management.   Past Medical History:  Diagnosis Date  . Ankle fracture 12/31/2018   left  . Anxiety   . Arthritis    neck, back, ankle, knees  . Ascending aortic aneurysm (HCC)    a. 04/2016 CTA: 4.6cm Asc TAA; b. 04/2018 CTA: 4.8cm; c. 10/2018 CTA: 4.9x4.8cm; d. 12/2019 Echo: 4.8 cm.  . Bicuspid aortic valve   . Breast screening, unspecified   . Carotid arterial disease (HCC)    a. 01/2020 U/S: 1-39% bilat ICA stenosis. No evidence of FMD.  . Cervicalgia    cervical stenosis  . Diabetes mellitus without complication (HCC)   . Elevated coronary artery calcium score    a. 03/2016 Cardiac CT: Ca2+ = 78 (90th %'ile).  . Fibromuscular dysplasia (HCC)   . Heart murmur   . History of hiatal hernia   . Metatarsal fracture 2007   5th, from wearing high heels  . Moderate aortic stenosis    a. 12/2019 Echo: EF 60-65%, no rwma, mild LVH, gr1 DD, nl RV size/fxn, triv MR, mild AI, mod AS, mod-sev dil of Asc Ao - 19mm.  AoV reported as tricuspid but prev known to be bicuspid.  . Multiple thyroid nodules    a. 10/2019 U/S: R mid thyroid nodule - f/u 5 yrs.  . Other symptoms involving abdomen and pelvis(789.9)   . Renal artery  stenosis (HCC)    a. 12/2019 Renal duplex: Nl RRA. LRA >60% stenosis.  . Renal cyst, right    a. 12/2019 Duplex: 5.8 x 5.7 cm R renal cyst in lower pole.  [name] Retinal hemorrhage   . Symptomatic menopausal or female climacteric states   . Unspecified essential hypertension   . Wears contact lenses    Past Surgical History:  Procedure Laterality Date  . ABDOMINAL HYSTERECTOMY    . APPENDECTOMY     done during oophorectomy  . CESAREAN SECTION     x 2  . ETHMOIDECTOMY Left 03/02/2020   Procedure: ETHMOIDECTOMY;  Surgeon: Vernie Murders, MD;  Location: [facility];  Service: ENT;  Laterality: Left;  . gyn surgery     hysterectomy- done for adenomysosis and endometriosis  . IMAGE GUIDED SINUS SURGERY N/A 03/02/2020   Procedure: IMAGE GUIDED SINUS SURGERY;  Surgeon: Vernie Murders, MD;  Location: [facility];  Service: ENT;  Laterality: N/A;  need disk PLACED DISK ON OR CHARGE NURSE DESK 9-17 KP  . IMAGE GUIDED SINUS SURGERY Left 04/13/2020   Procedure: IMAGE GUIDED SINUS SURGERY;  Surgeon: Vernie Murders, MD;  Location: [facility];  Service: ENT;  Laterality: Left;  needs stryker disk per Gloriajean Dell use same disk as before (October) put on charge nurses desk. DS 04/03/20  . MAXILLARY ANTROSTOMY Left 03/02/2020   Procedure:  MAXILLARY ANTROSTOMY;  Surgeon: Vernie Murders, MD;  Location: [facility];  Service: ENT;  Laterality: Left;  [name] MAXILLARY ANTROSTOMY Left 04/13/2020   Procedure: MAXILLECTOMY;  Surgeon: Vernie Murders, MD;  Location: [facility];  Service: ENT;  Laterality: Left;  . OOPHORECTOMY     unilateral, due to ruptured ovarian cysts  . OSTEOTOMY MAXILLARY     due to bite problems  . TUBAL LIGATION     Social History   Socioeconomic History  . Marital status: Married    Spouse name: Not on file  . Number of children: 3  . Years of education: Not on file  . Highest education level: Not on file  Occupational History  . Not on file  Tobacco Use   . Smoking status: Never Smoker  . Smokeless tobacco: Never Used  Vaping Use  . Vaping Use: Never used  Substance and Sexual Activity  . Alcohol use: Yes    Comment: rare  . Drug use: No  . Sexual activity: Not on file  Other Topics Concern  . Not on file  Social History Narrative   Lives with spouse.   Social Determinants of Health   Financial Resource Strain: Not on file  Food Insecurity: Not on file  Transportation Needs: Not on file  Physical Activity: Not on file  Stress: Not on file  Social Connections: Not on file   Family History  Problem Relation Age of Onset  . Diabetes Mother   . Aortic stenosis Mother   . Cancer Father        leukemia  . Retinoblastoma Son   . Melanoma Son   . Cancer Maternal Aunt        breast  . Breast cancer Maternal Aunt 70   Allergies  Allergen Reactions  . Hctz [Hydrochlorothiazide] Other (See Comments)    Decreased gfr  . Prozac [Fluoxetine Hcl] Palpitations   Prior to Admission medications   Medication Sig Start Date End Date Taking? Authorizing Provider  ALPRAZolam Prudy Feeler) 0.25 MG tablet TAKE 1 TABLET BY MOUTH TWICE DAILY AS NEEDED 06/12/20 12/09/20 Yes Sherlene Shams, MD  carvedilol (COREG) 25 MG tablet TAKE 1 TABLET BY MOUTH TWICE DAILY WITH A MEAL. 05/11/20 05/11/21 Yes Sherlene Shams, MD  estradiol (ESTRACE) 0.1 MG/GM vaginal cream INSERT 1/4 APPLICATORFUL VAGINALLY TWO TIMES A WEEK AS DIRECTED 10/20/19 10/19/20 Yes Sherlene Shams, MD  fluticasone (FLONASE) 50 MCG/ACT nasal spray PLACE 2 SPRAYS INTO BOTH NOSTRILS DAILY. 05/11/20 05/11/21 Yes Sherlene Shams, MD  hydrALAZINE (APRESOLINE) 10 MG tablet Take 1 tablet (10 mg total) by mouth 3 (three) times daily. 08/22/20 11/21/20 Yes Creig Hines, NP  HYDROcodone-acetaminophen (NORCO) 10-325 MG tablet TAKE 1 TABLET BY MOUTH EVERY 6 (SIX) HOURS AS NEEDED FOR SEVERE PAIN. 06/06/20 12/03/20 Yes Sherlene Shams, MD  losartan (COZAAR) 100 MG tablet TAKE 1 TABLET (100 MG TOTAL)  BY MOUTH DAILY. 04/03/20 04/03/21 Yes Sherlene Shams, MD  Menthol, Topical Analgesic, (BIOFREEZE EX) Apply topically.   Yes [provider]  metFORMIN (GLUCOPHAGE-XR) 750 MG 24 hr tablet TAKE 1 TABLET BY MOUTH DAILY WITH BREAKFAST. 08/11/20 08/11/21 Yes Sherlene Shams, MD  PREVIDENT 5000 BOOSTER PLUS 1.1 % PSTE Place onto teeth daily. 01/25/20  Yes [provider]  rosuvastatin (CRESTOR) 5 MG tablet Take 1 tablet (5 mg total) by mouth at bedtime. 02/01/20  Yes Iran Ouch, MD  traMADol (ULTRAM) 50 MG tablet TAKE 2 TABLETS (100 MG TOTAL) BY MOUTH  EVERY 6 (SIX) HOURS AS NEEDED. 06/06/20 12/03/20 Yes Sherlene Shams, MD  spironolactone (ALDACTONE) 25 MG tablet Take 1 tablet (25 mg total) by mouth daily. 04/19/20 05/22/20  Iran Ouch, MD     Positive ROS: All other systems have been reviewed and were otherwise negative with the exception of those mentioned in the HPI and as above.  Physical Exam:  Vitals:   08/24/20 1230 08/24/20 1235  BP: (!) 150/86 (!) 151/88  Pulse: 61 62  Resp: 12 17  Temp:    SpO2: 100% 100%   General: Alert, no acute distress Cardiovascular: No pedal edema Respiratory: No cyanosis, no use of accessory musculature GI: No organomegaly, abdomen is soft and non-tender Skin: No lesions in the area of chief complaint Neurologic: Sensation intact distally Psychiatric: Patient is competent for consent with normal mood and affect Lymphatic: No axillary or cervical lymphadenopathy  MUSCULOSKELETAL: Left ankle with swelling laterally over the lateral ligament complex.  She has tenderness in this area.  No peroneal tendon thickening.  Good hindfoot eversion strength.  No obvious peroneal tendon dislocation.  Ankle is unstable to anterior drawer testing.  No discomfort with passive range of motion of the ankle with full motion.  Foot is warm and well-perfused distally.  Assessment: Left ankle instability Left talus osteochondral lesion Left ankle  impingement   Plan: Plan for left ankle arthroscopy debridement and treatment of osteochondral lesion, lateral ligament reconstruction.  We discussed the risks, benefits and alternatives of surgery which include but are not limited to wound healing complications, infection, nonunion, malunion, need for further surgery, damage to surrounding structures and continued pain.  They understand there is no guarantees to an acceptable outcome.  After weighing these risks they opted to proceed with surgery.     Terance Hart, MD    08/24/2020 1:20 PM

## 2020-08-27 NOTE — Anesthesia Postprocedure Evaluation (Signed)
Anesthesia Post Note  Patient: Courtney Lopez  Procedure(s) Performed: LEFT ANKLE ARTHROSCOPIC DEBRIDEMENT, EXTENSIVE, ARTHOSCROPIC TREATMENT OF TALUS OSTEOCHONDRAL LESION, LATERAL LIGAMENT RECONSTRUCTION (Left Ankle)     Patient location during evaluation: PACU Anesthesia Type: Regional and General Level of consciousness: awake and alert Pain management: pain level controlled Vital Signs Assessment: post-procedure vital signs reviewed and stable Respiratory status: spontaneous breathing, nonlabored ventilation, respiratory function stable and patient connected to nasal cannula oxygen Cardiovascular status: blood pressure returned to baseline and stable Postop Assessment: no apparent nausea or vomiting Anesthetic complications: no   No complications documented.  Last Vitals:  Vitals:   08/24/20 1618 08/24/20 1630  BP: (!) 158/90 (!) 166/96  Pulse:    Resp: 14 18  Temp:  36.8 C  SpO2:  100%    Last Pain:  Vitals:   08/24/20 1545  TempSrc:   PainSc: 0-No pain                 Elleanna Melling L Edra Riccardi

## 2020-08-28 ENCOUNTER — Encounter (HOSPITAL_BASED_OUTPATIENT_CLINIC_OR_DEPARTMENT_OTHER): Payer: Self-pay | Admitting: Orthopaedic Surgery

## 2020-08-31 ENCOUNTER — Other Ambulatory Visit: Payer: Self-pay | Admitting: Internal Medicine

## 2020-08-31 ENCOUNTER — Other Ambulatory Visit: Payer: Self-pay

## 2020-08-31 ENCOUNTER — Telehealth: Payer: Self-pay | Admitting: Cardiovascular Disease

## 2020-08-31 DIAGNOSIS — J069 Acute upper respiratory infection, unspecified: Secondary | ICD-10-CM

## 2020-08-31 MED FILL — Tramadol HCl Tab 50 MG: ORAL | 30 days supply | Qty: 240 | Fill #0 | Status: AC

## 2020-08-31 MED FILL — Estradiol Vaginal Cream 0.1 MG/GM: VAGINAL | 30 days supply | Qty: 42.5 | Fill #0 | Status: AC

## 2020-08-31 NOTE — Telephone Encounter (Signed)
error 

## 2020-09-01 ENCOUNTER — Other Ambulatory Visit: Payer: Self-pay

## 2020-09-01 MED ORDER — SODIUM FLUORIDE 1.1 % DT CREA
TOPICAL_CREAM | DENTAL | 10 refills | Status: DC
  Filled 2020-09-01: qty 51, 30d supply, fill #0
  Filled 2020-12-27: qty 51, 30d supply, fill #1

## 2020-09-01 MED ORDER — FLUTICASONE PROPIONATE 50 MCG/ACT NA SUSP
2.0000 | Freq: Every day | NASAL | 2 refills | Status: DC
  Filled 2020-09-01: qty 16, 30d supply, fill #0
  Filled 2020-10-12: qty 16, 30d supply, fill #1
  Filled 2020-11-10: qty 16, 30d supply, fill #2

## 2020-09-11 ENCOUNTER — Other Ambulatory Visit: Payer: Self-pay

## 2020-09-11 ENCOUNTER — Ambulatory Visit: Payer: 59 | Admitting: Cardiovascular Disease

## 2020-09-11 MED FILL — Alprazolam Tab 0.25 MG: ORAL | 30 days supply | Qty: 60 | Fill #0 | Status: AC

## 2020-10-02 ENCOUNTER — Other Ambulatory Visit: Payer: Self-pay

## 2020-10-02 MED FILL — Losartan Potassium Tab 100 MG: ORAL | 90 days supply | Qty: 90 | Fill #0 | Status: AC

## 2020-10-02 MED FILL — Tramadol HCl Tab 50 MG: ORAL | 30 days supply | Qty: 240 | Fill #1 | Status: AC

## 2020-10-03 ENCOUNTER — Ambulatory Visit (INDEPENDENT_AMBULATORY_CARE_PROVIDER_SITE_OTHER): Payer: 59 | Admitting: Internal Medicine

## 2020-10-03 ENCOUNTER — Other Ambulatory Visit: Payer: Self-pay

## 2020-10-03 ENCOUNTER — Encounter: Payer: Self-pay | Admitting: Internal Medicine

## 2020-10-03 VITALS — BP 144/98 | HR 86 | Temp 96.4°F | Resp 15 | Ht 66.5 in | Wt 179.0 lb

## 2020-10-03 DIAGNOSIS — I10 Essential (primary) hypertension: Secondary | ICD-10-CM | POA: Diagnosis not present

## 2020-10-03 DIAGNOSIS — E1169 Type 2 diabetes mellitus with other specified complication: Secondary | ICD-10-CM

## 2020-10-03 DIAGNOSIS — Z1231 Encounter for screening mammogram for malignant neoplasm of breast: Secondary | ICD-10-CM

## 2020-10-03 DIAGNOSIS — I350 Nonrheumatic aortic (valve) stenosis: Secondary | ICD-10-CM | POA: Diagnosis not present

## 2020-10-03 DIAGNOSIS — E1121 Type 2 diabetes mellitus with diabetic nephropathy: Secondary | ICD-10-CM

## 2020-10-03 DIAGNOSIS — I701 Atherosclerosis of renal artery: Secondary | ICD-10-CM

## 2020-10-03 DIAGNOSIS — D649 Anemia, unspecified: Secondary | ICD-10-CM

## 2020-10-03 DIAGNOSIS — Z0000 Encounter for general adult medical examination without abnormal findings: Secondary | ICD-10-CM | POA: Diagnosis not present

## 2020-10-03 DIAGNOSIS — E669 Obesity, unspecified: Secondary | ICD-10-CM

## 2020-10-03 DIAGNOSIS — E559 Vitamin D deficiency, unspecified: Secondary | ICD-10-CM

## 2020-10-03 DIAGNOSIS — I712 Thoracic aortic aneurysm, without rupture, unspecified: Secondary | ICD-10-CM

## 2020-10-03 DIAGNOSIS — E785 Hyperlipidemia, unspecified: Secondary | ICD-10-CM

## 2020-10-03 MED ORDER — HYDROCODONE-ACETAMINOPHEN 10-325 MG PO TABS
1.0000 | ORAL_TABLET | Freq: Every day | ORAL | 0 refills | Status: AC | PRN
  Filled 2020-10-03: qty 30, 30d supply, fill #0

## 2020-10-03 NOTE — Progress Notes (Signed)
Patient ID: Courtney Lopez, female    DOB: 09-Dec-1960  Age: 60 y.o. MRN: [id number]  The patient is here for annual preventive examination and management of other chronic and acute problems.   The risk factors are reflected in the social history.  The roster of all physicians providing medical care to patient - is listed in the Snapshot section of the chart.  Activities of daily living:  The patient is 100% independent in all ADLs: dressing, toileting, feeding as well as independent mobility  Home safety : The patient has smoke detectors in the home. They wear seatbelts.  There are no firearms at home. There is no violence in the home.   There is no risks for hepatitis, STDs or HIV. There is no   history of blood transfusion. They have no travel history to infectious disease endemic areas of the world.  The patient has seen their dentist in the last six month. They have seen their eye doctor in the last year. They admit to slight hearing difficulty with regard to whispered voices and some television programs.  They have deferred audiologic testing in the last year.  They do not  have excessive sun exposure. Discussed the need for sun protection: hats, long sleeves and use of sunscreen if there is significant sun exposure.   Diet: the importance of a healthy diet is discussed. They do have a healthy diet.  The benefits of regular aerobic exercise were discussed. Courtney Lopez walks 4 times per week ,  20 minutes.   Depression screen: there are no signs or vegative symptoms of depression- irritability, change in appetite, anhedonia, sadness/tearfullness.  The following portions of the patient's history were reviewed and updated as appropriate: allergies, current medications, past family history, past medical history,  past surgical history, past social history  and problem list.  Visual acuity was not assessed per patient preference since Courtney Lopez has regular follow up with her ophthalmologist. Hearing and  body mass index were assessed and reviewed.   During the course of the visit the patient was educated and counseled about appropriate screening and preventive services including : fall prevention , diabetes screening, nutrition counseling, colorectal cancer screening, and recommended immunizations.    CC: The primary encounter diagnosis was Encounter for screening mammogram for malignant neoplasm of breast. Diagnoses of Primary hypertension, Diabetes mellitus type 2 in obese (HCC), Vitamin D deficiency, Anemia, unspecified type, Encounter for preventive health examination, Hyperlipidemia associated with type 2 diabetes mellitus (HCC), Diabetic nephropathy associated with type 2 diabetes mellitus (HCC), Renal artery stenosis (HCC), Aortic valve stenosis, etiology of cardiac valve disease unspecified, and Thoracic aortic aneurysm without rupture (HCC) were also pertinent to this visit.  1)  Has been non weight bearing for a Left ankle reconstruction done in April by Dr Janae Bridgeman.  The ankle injury occurred during a work related accident work in 2020.   2) labile BP , left renal artery stenosis by dopplers in 2020.  Had an episode of  orthostasis  Yesterday due to lack of hydration . Occurred yesterday.    Taking hydralazine 10  mg tid.did no  3) did not tolerate oxycodone due to nausea so her pain was uncontrolled postoperatively,  And Blood pressure was elevated during use of it so Courtney Lopez has resumed  hydrocodone and tramadol. Refill history confirmed via [place] Controlled Substance databas, accessed by me today.[name]  4) Chronic neck pain :  aggravated by surgery,  They did not use a cervical support pillow  this time. Pain is no longer radiating to arms and no numbness.    Using one Civodin daily and tramadl throughtout the day   History Courtney Lopez has a past medical history of Ankle fracture (12/31/2018), Anxiety, Arthritis, Ascending aortic aneurysm (HCC), Bicuspid aortic valve, Breast screening,  unspecified, Carotid arterial disease (HCC), Cervicalgia, Diabetes mellitus without complication (HCC), Elevated coronary artery calcium score, Fibromuscular dysplasia (HCC), Heart murmur, History of hiatal hernia, Metatarsal fracture (2007), Moderate aortic stenosis, Multiple thyroid nodules, Other symptoms involving abdomen and pelvis(789.9), Renal artery stenosis (HCC), Renal cyst, right, Retinal hemorrhage, Symptomatic menopausal or female climacteric states, Unspecified essential hypertension, and Wears contact lenses.   Courtney Lopez has a past surgical history that includes gyn surgery; Oophorectomy; Osteotomy maxillary; Appendectomy; Cesarean section; Tubal ligation; Abdominal hysterectomy; Image guided sinus surgery (N/A, 03/02/2020); Ethmoidectomy (Left, 03/02/2020); Maxillary antrostomy (Left, 03/02/2020); Image guided sinus surgery (Left, 04/13/2020); Maxillary antrostomy (Left, 04/13/2020); and Ankle arthroscopy with reconstruction (Left, 08/24/2020).   Her family history includes Aortic stenosis in her mother; Breast cancer (age of onset: 9370) in her maternal aunt; Cancer in her father and maternal aunt; Diabetes in her mother; Melanoma in her son; Retinoblastoma in her son.Courtney Lopez reports that Courtney Lopez has never smoked. Courtney Lopez has never used smokeless tobacco. Courtney Lopez reports current alcohol use. Courtney Lopez reports that Courtney Lopez does not use drugs.  Outpatient Medications Prior to Visit  Medication Sig Dispense Refill  . ALPRAZolam (XANAX) 0.25 MG tablet TAKE 1 TABLET BY MOUTH TWICE DAILY AS NEEDED 60 tablet 5  . carvedilol (COREG) 25 MG tablet TAKE 1 TABLET BY MOUTH TWICE DAILY WITH A MEAL. 180 tablet 1  . estradiol (ESTRACE) 0.1 MG/GM vaginal cream INSERT 1/4 APPLICATORFUL VAGINALLY TWO TIMES A WEEK AS DIRECTED 42.5 g 5  . fluticasone (FLONASE) 50 MCG/ACT nasal spray PLACE 2 SPRAYS INTO BOTH NOSTRILS DAILY. 16 g 2  . hydrALAZINE (APRESOLINE) 10 MG tablet Take 1 tablet (10 mg total) by mouth 3 (three) times daily. 270 tablet 3   . losartan (COZAAR) 100 MG tablet TAKE 1 TABLET (100 MG TOTAL) BY MOUTH DAILY. 90 tablet 3  . Menthol, Topical Analgesic, (BIOFREEZE EX) Apply topically.    . metFORMIN (GLUCOPHAGE-XR) 750 MG 24 hr tablet TAKE 1 TABLET BY MOUTH DAILY WITH BREAKFAST. 90 tablet 0  . rosuvastatin (CRESTOR) 5 MG tablet Take 1 tablet (5 mg total) by mouth at bedtime. 90 tablet 2  . sodium fluoride (PREVIDENT 5000 PLUS) 1.1 % CREA dental cream Brush teeth thoroughly for 2 minutes, do not rinse afterwards. 51 g 10  . traMADol (ULTRAM) 50 MG tablet TAKE 2 TABLETS (100 MG TOTAL) BY MOUTH EVERY 6 (SIX) HOURS AS NEEDED. 240 tablet 5  . HYDROcodone-acetaminophen (NORCO) 10-325 MG tablet TAKE 1 TABLET BY MOUTH EVERY 6 (SIX) HOURS AS NEEDED FOR SEVERE PAIN. 30 tablet 0  . PREVIDENT 5000 BOOSTER PLUS 1.1 % PSTE Place onto teeth daily. (Patient not taking: Reported on 10/03/2020)     No facility-administered medications prior to visit.    Review of Systems   Patient denies headache, fevers, malaise, unintentional weight loss, skin rash, eye pain, sinus congestion and sinus pain, sore throat, dysphagia,  hemoptysis , cough, dyspnea, wheezing, chest pain, palpitations, orthopnea, edema, abdominal pain, nausea, melena, diarrhea, constipation, flank pain, dysuria, hematuria, urinary  Frequency, nocturia, numbness, tingling, seizures,  Focal weakness, Loss of consciousness,  Tremor, insomnia, depression, anxiety, and suicidal ideation.     Objective:  BP (!) 144/98 (BP Location: Left Arm, Patient Position: Sitting, Cuff Size:  Normal)   Pulse 86   Temp (!) 96.4 F (35.8 C) (Temporal)   Resp 15   Ht 5' 6.5" (1.689 m)   Wt 179 lb (81.2 kg)   LMP  (LMP Unknown)   SpO2 97%   BMI 28.46 kg/m   Physical Exam  General appearance: alert, cooperative and appears stated age Ears: normal TM's and external ear canals both ears Throat: lips, mucosa, and tongue normal; teeth and gums normal Neck: no adenopathy, no carotid bruit,  supple, symmetrical, trachea midline and thyroid not enlarged, symmetric, no tenderness/mass/nodules Back: symmetric, no curvature. ROM normal. No CVA tenderness. Lungs: clear to auscultation bilaterally Heart: regular rate and rhythm, S1, S2 normal, no murmur, click, rub or gallop Abdomen: soft, non-tender; bowel sounds normal; no masses,  no organomegaly Pulses: 2+ and symmetric Skin: Skin color, texture, turgor normal. No rashes or lesions Lymph nodes: Cervical, supraclavicular, and axillary nodes normal.  Assessment & Plan:   Problem List Items Addressed This Visit      Unprioritized   Aortic stenosis   Diabetes mellitus type 2 in obese [facility])    Managed with  metformin XR.  Will add ozempic or trulicity if repeat a1c is > 7.0   Continue ARB .     Lab Results  Component Value Date   HGBA1C 6.3 06/29/2020   Lab Results  Component Value Date   MICROALBUR 3.9 (H) 10/20/2019   MICROALBUR 6.0 (H) 05/15/2018           Relevant Orders   Hemoglobin A1c   Microalbumin / creatinine urine ratio   Lipid panel   Comprehensive metabolic panel   Diabetic nephropathy associated with type 2 diabetes mellitus (HCC)    Well controlled on current regimen of telmisartan.  no changes today.  Lab Results  Component Value Date   NA 136 08/22/2020   K 4.2 08/22/2020   CL 101 08/22/2020   CO2 27 08/22/2020   Lab Results  Component Value Date   CREATININE 1.00 08/22/2020   Lab Results  Component Value Date   MICROALBUR 3.9 (H) 10/20/2019   MICROALBUR 6.0 (H) 05/15/2018           Encounter for preventive health examination    age appropriate education and counseling updated, referrals for preventative services and immunizations addressed, dietary and smoking counseling addressed, most recent labs reviewed.  I have personally reviewed and have noted:  1) the patient's medical and social history 2) The pt's use of alcohol, tobacco, and illicit drugs 3) The patient's current  medications and supplements 4) Functional ability including ADL's, fall risk, home safety risk, hearing and visual impairment 5) Diet and physical activities 6) Evidence for depression or mood disorder 7) The patient's height, weight, and BMI have been recorded in the chart  I have made referrals, and provided counseling and education based on review of the above      Hyperlipidemia associated with type 2 diabetes mellitus (HCC)    LDL is at goal on rosuvastatin,    Lab Results  Component Value Date   CHOL 125 06/29/2020   HDL 56.00 06/29/2020   LDLCALC 53 06/29/2020   LDLDIRECT 101.0 11/11/2016   TRIG 77.0 06/29/2020   CHOLHDL 2 06/29/2020         Hypertension    With RAS>60% of the  left renal artery,  Thoracic aortic dilation.  No intervention per Arida bc bp was controlled. Had leg swelling with amlodipine.  Taking carvedilol,  Losartan and  hydralazine. No changes today based on home readings which have been lower.        Renal artery stenosis (HCC)    BP is controlled; intervention is deferred for now      Screening for breast cancer - Primary   Relevant Orders   MM 3D SCREEN BREAST BILATERAL   Thoracic aortic aneurysm without rupture (HCC)    Last measurement by  2020 CT angiogram: Unchanged aneurysmal dilatation of the tubular ascending thoracic aorta, measuring 4.9 x 4.8 cm confirmed by August 2021 ECHO:   Aortic dilatation noted. There is moderate to severe dilatation of the ascending aorta measuring 48 mm.  Repeat CT is scheduled .  Patient is taking ARB,d beta blocker and hydralazine for BP control goal of 120/70 or less        Other Visit Diagnoses    Vitamin D deficiency       Relevant Orders   VITAMIN D 25 Hydroxy (Vit-D Deficiency, Fractures)   Anemia, unspecified type       Relevant Orders   CBC with Differential/Platelet      I have changed Shaneil E. Amato's HYDROcodone-acetaminophen. I am also having her maintain her (Menthol, Topical  Analgesic, (BIOFREEZE EX)), rosuvastatin, ALPRAZolam, traMADol, carvedilol, losartan, estradiol, metFORMIN, hydrALAZINE, fluticasone, and sodium fluoride.  Meds ordered this encounter  Medications  . HYDROcodone-acetaminophen (NORCO) 10-325 MG tablet    Sig: Take 1 tablet by mouth daily as needed for severe pain.    Dispense:  30 tablet    Refill:  0    Medications Discontinued During This Encounter  Medication Reason  . PREVIDENT 5000 BOOSTER PLUS 1.1 % PSTE Duplicate  . HYDROcodone-acetaminophen (NORCO) 10-325 MG tablet Reorder    Follow-up: No follow-ups on file.   Sherlene Shams, MD

## 2020-10-03 NOTE — Patient Instructions (Signed)
Your annual mammogram has been ordered.  You will need to  call to make your own  appointment at [facility]  [phone]  WHEN YOU ARE READY   [facility] SCREENING IS NOT DUE UNTIL 2024    LABS IN AUGUST (FASTING) INCLUDING A CBC AND VITAMIN D   Please check your blood pressure a few times at home and send me the readings so I can determine if you need a change in medication

## 2020-10-03 NOTE — Assessment & Plan Note (Addendum)
With RAS>60% of the  left renal artery,  Thoracic aortic dilation.  No intervention per Arida bc bp was controlled. Had leg swelling with amlodipine.  Taking carvedilol,  Losartan and hydralazine. No changes today based on home readings which have been lower.

## 2020-10-04 NOTE — Assessment & Plan Note (Signed)

## 2020-10-04 NOTE — Assessment & Plan Note (Signed)
BP is controlled; intervention is deferred for now

## 2020-10-04 NOTE — Assessment & Plan Note (Signed)
LDL is at goal on rosuvastatin,    Lab Results  Component Value Date   CHOL 125 06/29/2020   HDL 56.00 06/29/2020   LDLCALC 53 06/29/2020   LDLDIRECT 101.0 11/11/2016   TRIG 77.0 06/29/2020   CHOLHDL 2 06/29/2020

## 2020-10-04 NOTE — Assessment & Plan Note (Signed)
Managed with  metformin XR.  Will add ozempic or trulicity if repeat a1c is > 7.0   Continue ARB .     Lab Results  Component Value Date   HGBA1C 6.3 06/29/2020   Lab Results  Component Value Date   MICROALBUR 3.9 (H) 10/20/2019   MICROALBUR 6.0 (H) 05/15/2018

## 2020-10-04 NOTE — Assessment & Plan Note (Signed)
Well controlled on current regimen of telmisartan.  no changes today.  Lab Results  Component Value Date   NA 136 08/22/2020   K 4.2 08/22/2020   CL 101 08/22/2020   CO2 27 08/22/2020   Lab Results  Component Value Date   CREATININE 1.00 08/22/2020   Lab Results  Component Value Date   MICROALBUR 3.9 (H) 10/20/2019   MICROALBUR 6.0 (H) 05/15/2018

## 2020-10-05 NOTE — Assessment & Plan Note (Addendum)
Last measurement by  2020 CT angiogram: Unchanged aneurysmal dilatation of the tubular ascending thoracic aorta, measuring 4.9 x 4.8 cm confirmed by August 2021 ECHO:   Aortic dilatation noted. There is moderate to severe dilatation of the ascending aorta measuring 48 mm.  Repeat CT is scheduled .  Patient is taking ARB,d beta blocker and hydralazine for BP control goal of 120/70 or less

## 2020-10-11 ENCOUNTER — Ambulatory Visit: Payer: PRIVATE HEALTH INSURANCE | Attending: Orthopaedic Surgery | Admitting: Physical Therapy

## 2020-10-11 ENCOUNTER — Other Ambulatory Visit: Payer: Self-pay

## 2020-10-11 ENCOUNTER — Encounter: Payer: Self-pay | Admitting: Physical Therapy

## 2020-10-11 DIAGNOSIS — M25572 Pain in left ankle and joints of left foot: Secondary | ICD-10-CM | POA: Diagnosis present

## 2020-10-11 DIAGNOSIS — R278 Other lack of coordination: Secondary | ICD-10-CM | POA: Diagnosis present

## 2020-10-11 DIAGNOSIS — M6281 Muscle weakness (generalized): Secondary | ICD-10-CM | POA: Insufficient documentation

## 2020-10-11 DIAGNOSIS — R2689 Other abnormalities of gait and mobility: Secondary | ICD-10-CM | POA: Insufficient documentation

## 2020-10-11 DIAGNOSIS — R269 Unspecified abnormalities of gait and mobility: Secondary | ICD-10-CM | POA: Diagnosis present

## 2020-10-11 DIAGNOSIS — R262 Difficulty in walking, not elsewhere classified: Secondary | ICD-10-CM | POA: Insufficient documentation

## 2020-10-11 NOTE — Therapy (Signed)
[facility] MAIN [facility] SERVICES [address] [place], [place], [address] Phone: [phone]   Fax:  [phone]  Physical Therapy Treatment  Patient Details  Name: Courtney Lopez MRN: [id number] Date of Birth: 1960-11-07 Referring Provider (PT): Dr. Dub Mikes   Encounter Date: 10/11/2020   PT End of Session - 10/11/20 1454    Visit Number 1    Number of Visits 17    Date for PT Re-Evaluation 12/06/20    Authorization Type Worker's comp, 1 eval plus 12 visits    Authorization - Visit Number 0    Authorization - Number of Visits 12    PT Start Time 1345    PT Stop Time 1440    PT Time Calculation (min) 55 min    Equipment Utilized During Treatment Gait belt    Activity Tolerance Patient tolerated treatment well    Behavior During Therapy WFL for tasks assessed/performed           Past Medical History:  Diagnosis Date  . Ankle fracture 12/31/2018   left  . Anxiety   . Arthritis    neck, back, ankle, knees  . Ascending aortic aneurysm (HCC)    a. 04/2016 CTA: 4.6cm Asc TAA; b. 04/2018 CTA: 4.8cm; c. 10/2018 CTA: 4.9x4.8cm; d. 12/2019 Echo: 4.8 cm.  . Bicuspid aortic valve   . Breast screening, unspecified   . Carotid arterial disease (HCC)    a. 01/2020 U/S: 1-39% bilat ICA stenosis. No evidence of FMD.  . Cervicalgia    cervical stenosis  . Diabetes mellitus without complication (HCC)   . Elevated coronary artery calcium score    a. 03/2016 Cardiac CT: Ca2+ = 78 (90th %'ile).  . Fibromuscular dysplasia (HCC)   . Heart murmur   . History of hiatal hernia   . Metatarsal fracture 2007   5th, from wearing high heels  . Moderate aortic stenosis    a. 12/2019 Echo: EF 60-65%, no rwma, mild LVH, gr1 DD, nl RV size/fxn, triv MR, mild AI, mod AS, mod-sev dil of Asc Ao - 62mm.  AoV reported as tricuspid but prev known to be bicuspid.  . Multiple thyroid nodules    a. 10/2019 U/S: R mid thyroid nodule - f/u 5 yrs.  . Other  symptoms involving abdomen and pelvis(789.9)   . Renal artery stenosis (HCC)    a. 12/2019 Renal duplex: Nl RRA. LRA >60% stenosis.  . Renal cyst, right    a. 12/2019 Duplex: 5.8 x 5.7 cm R renal cyst in lower pole.  [name] Retinal hemorrhage   . Symptomatic menopausal or female climacteric states   . Unspecified essential hypertension   . Wears contact lenses     Past Surgical History:  Procedure Laterality Date  . ABDOMINAL HYSTERECTOMY    . ANKLE ARTHROSCOPY WITH RECONSTRUCTION Left 08/24/2020   Procedure: LEFT ANKLE ARTHROSCOPIC DEBRIDEMENT, EXTENSIVE, ARTHOSCROPIC TREATMENT OF TALUS OSTEOCHONDRAL LESION, LATERAL LIGAMENT RECONSTRUCTION;  Surgeon: Terance Hart, MD;  Location: [facility];  Service: Orthopedics;  Laterality: Left;  . APPENDECTOMY     done during oophorectomy  . CESAREAN SECTION     x 2  . ETHMOIDECTOMY Left 03/02/2020   Procedure: ETHMOIDECTOMY;  Surgeon: Vernie Murders, MD;  [facility];  Service: ENT;  Laterality: Left;  . gyn surgery     hysterectomy- done for adenomysosis and endometriosis  . IMAGE GUIDED SINUS SURGERY N/A 03/02/2020   Procedure: IMAGE GUIDED SINUS SURGERY;  Surgeon:  Vernie MurdersJuengel, Paul, MD;  [facility];  Service: ENT;  Laterality: N/A;  need disk PLACED DISK ON OR CHARGE NURSE DESK 9-17 KP  . IMAGE GUIDED SINUS SURGERY Left 04/13/2020   Procedure: IMAGE GUIDED SINUS SURGERY;  Surgeon: Vernie MurdersJuengel, Paul, MD;  [facility];  Service: ENT;  Laterality: Left;  needs stryker disk per Gloriajean DellNadine use same disk as before (October) put on charge nurses desk. DS 04/03/20  . MAXILLARY ANTROSTOMY Left 03/02/2020   Procedure: MAXILLARY ANTROSTOMY;  Surgeon: Vernie MurdersJuengel, Paul, MD;  [facility];  Service: ENT;  Laterality: Left;  [name]. MAXILLARY ANTROSTOMY Left 04/13/2020   Procedure: MAXILLECTOMY;  Surgeon: Vernie MurdersJuengel, Paul, MD;  [facility];  Service: ENT;  Laterality: Left;  .  OOPHORECTOMY     unilateral, due to ruptured ovarian cysts  . OSTEOTOMY MAXILLARY     due to bite problems  . TUBAL LIGATION      There were no vitals filed for this visit.   Subjective Assessment - 10/11/20 1346    Subjective "I fell over a man hole cover into the landscaping and that's when it all started."    Pertinent History Courtney SenterStephanie E Lopez is a 60 y.o. female s/p left ankle arthroscope for osteochondral lesions due to left ankle instability.  She initially sustained an injury back in August 2020 where she had a nondisplaced fracture of her fibula.  She healed this uneventfully but then had a reinjury approximately 1 year later with resultant instability despite conservative treatment.  X-rays and MRI scan revealed an osteochondral lesion on the medial talar dome.  Prior x-rays from several years ago demonstrated similar lesion but not as severe. Symptoms are rated as moderate to severe, and have been worsening.  This is significantly impairing activities of daily living.  She is s/p surgery on 08/24/20. Per Order, patient is WBAT in boot. Patient presents to therapy with knee walker. She reports while at work she is able to use the knee scooter for 20% of the time and then the rest of the time she is supposed to be sitting. She did start using a crutch over the weekend. reports some numbness/tingling down LLE into foot/ankle;    Currently in Pain? Yes    Pain Score 3     Pain Location Ankle    Pain Orientation Left    Pain Descriptors / Indicators Throbbing;Aching    Pain Type Chronic pain    Pain Onset More than a month ago    Pain Frequency Intermittent    Aggravating Factors  been on the foot for a long time, swelling; working    Pain Relieving Factors elevating and reducing swelling, some pain meds; hasn't used ice so that she doesn't limit bone growth    Effect of Pain on Daily Activities decreased activity tolerance;    Multiple Pain Sites No              [facility] PT  Assessment - 10/11/20 0001      Assessment   Medical Diagnosis s/p left ankle scope    Referring Provider (PT) Dr. Dub Mikeshristopher Adair    Onset Date/Surgical Date 08/24/20   surgical date   Hand Dominance Right    Next MD Visit 11/08/20    Prior Therapy did get outpatient PT for ankle with some improvement, but never fully healed; Reports she couldn't get it strong enough for stability; She was wearing lace up brace 75% of time prior to surgery;  Precautions   Precautions Other (comment)   wear boot when out of bed, WBAT in boot   Required Braces or Orthoses --   ankle boot     Restrictions   Weight Bearing Restrictions Yes    LLE Weight Bearing Weight bearing as tolerated      Balance Screen   Has the patient fallen in the past 6 months Yes    How many times? 3    Has the patient had a decrease in activity level because of a fear of falling?  Yes    Is the patient reluctant to leave their home because of a fear of falling?  Yes      Home Environment   Additional Comments Lives in 2-3  story home has 4 steps to enter/exit, has 8 steps to 2nd floor bedroom; has walk in shower, has built in seat;      Prior Function   Level of Independence Independent    Vocation Full time employment    Freight forwarder, Admin Coordinator   throughput, staffing, responding to emergency;   Leisure has new grandbabies,      Cognition   Overall Cognitive Status Within Functional Limits for tasks assessed      Observation/Other Assessments   Skin Integrity intact    Focus on Therapeutic Outcomes (FOTO)  40%      Observation/Other Assessments-Edema    Edema Circumferential;Figure 8      Circumferential Edema   Circumferential - Right middle of lateral malleolus:23.5 cm    Circumferential - Left  middle of lateral malleolus: 26 cm      Figure 8 Edema   Figure 8 - Right  49cm    Figure 8 - Left  50.5 cm      Sensation   Light Touch Impaired Detail    Light Touch Impaired Details  Impaired LLE   at L4 dermatome   Semmes Weinstein Monofilament Scale Loss of Protective   inconsistent 5.18 at L4 dermatome   Proprioception Appears Intact      Coordination   Gross Motor Movements are Fluid and Coordinated Yes    Fine Motor Movements are Fluid and Coordinated Yes      Posture/Postural Control   Posture Comments WFL      ROM / Strength   AROM / PROM / Strength AROM;Strength;PROM      AROM   AROM Assessment Site Ankle    Right/Left Ankle Left;Right    Right Ankle Dorsiflexion 14    Right Ankle Plantar Flexion 65    Right Ankle Inversion 45    Right Ankle Eversion 10    Left Ankle Dorsiflexion 5    Left Ankle Plantar Flexion 45    Left Ankle Inversion 15    Left Ankle Eversion -3      PROM   PROM Assessment Site Ankle    Right/Left Ankle Right;Left    Left Ankle Dorsiflexion 10    Left Ankle Plantar Flexion 50    Left Ankle Inversion 25    Left Ankle Eversion 10      Strength   Strength Assessment Site Ankle    Right/Left Ankle Right;Left    Right Ankle Dorsiflexion 5/5    Right Ankle Plantar Flexion 5/5    Right Ankle Inversion 5/5    Right Ankle Eversion 5/5    Left Ankle Dorsiflexion 3-/5    Left Ankle Plantar Flexion 3-/5    Left Ankle Inversion 2/5    Left Ankle  Eversion 2/5      Palpation   Palpation comment mild to moderate tenderness along left ankle      Transfers   Comments able to transfer sit<>Stand without pushing on arm rests, primarily using RLE to transfer      Ambulation/Gait   Gait Comments ambulates with RW, wearing boot on LLE, reciprocal gait pattern, mild antalgic gait with increased BUE weight bearing with left stance, short stance on left increased step length on RLE      Standardized Balance Assessment   Standardized Balance Assessment Five Times Sit to Stand;10 meter walk test    Five times sit to stand comments  9.8 sec without HHA, low fall risk    10 Meter Walk 0.8 m/s with RW, limited home ambulator              TREATMENT:  Instructed patient in ROM exercise for HEP including to do ankle circles and alphabet   Educated patient in gait safety with RW including stair negotiation, ascend/descend non-reciprocal, favoring LLE   Also educated patient in how to adjust walker to proper height                      PT Education - 10/11/20 1452    Education Details plan of care, stair negotiation, RW safety/adjustment    Person(s) Educated Patient    Methods Explanation    Comprehension Verbalized understanding            PT Short Term Goals - 10/11/20 1504      PT SHORT TERM GOAL #1   Title Patient will be adherent to HEP at least 3x a week to improve functional strength and balance for better safety at home.    Time 4    Period Weeks    Status New    Target Date 11/08/20      PT SHORT TERM GOAL #2   Title Patient will improve LLE ankle AROM: DF: 10 degrees, PF: 60 degrees, IV: 30 degrees, EV: 10 degrees to improve ankle ROM for ADLs including gait and transfers;    Time 4    Period Weeks    Status New    Target Date 11/08/20             PT Long Term Goals - 10/11/20 1505      PT LONG TERM GOAL #1   Title Patient will increase 10 meter walk test to >1.15m/s as to improve gait speed for better community ambulation and to reduce fall risk.    Time 8    Period Weeks    Status New    Target Date 12/06/20      PT LONG TERM GOAL #2   Title Patient will be independent with ascend/descend 8 steps using single UE in step over step pattern without LOB.    Time 8    Period Weeks    Status New    Target Date 12/06/20      PT LONG TERM GOAL #3   Title Patient will improve LLE ankle strength to grossly 4/5 to indicate improved strength for gait and walking tasks.    Time 8    Period Weeks    Status New    Target Date 12/06/20      PT LONG TERM GOAL #4   Title Patient will tolerate 5 seconds of single leg stance without loss of balance to improve ability to  get in and out of shower safely.  Time 8    Period Weeks    Status New    Target Date 12/06/20      PT LONG TERM GOAL #5   Title Patient will improve FOTO score to >60% to indicate improved functional mobility with ADLs.    Time 8    Period Weeks    Status New    Target Date 12/06/20      Additional Long Term Goals   Additional Long Term Goals Yes      PT LONG TERM GOAL #6   Title Patient will report a worst pain of 3/10 on VAS in   left ankle          to improve tolerance with ADLs and reduced symptoms with activities.    Time 8    Period Weeks    Status New    Target Date 12/06/20                 Plan - 10/11/20 1455    Clinical Impression Statement 60 yo Female s/p LLE ankle scope for orthochondral lesion and chronic ankle pain since August 2020. Patient fell outside hospital in August 2020 sustaining a left ankle fracture and since then has had chronic ankle pain and instability. She opted for ankle scope to address instability on 08/24/20. Patient has been NWB up until this week and has been released to [facility] in boot. She is still limited at work, using knee walker 20% of time and then sitting at desk rest of the time per MD orders. Patient exhibits limited ankle ROM with increased stiffness in all directions. She also exhibits weakness. In addition, patient exhibits loss of protective sensation and loss of  sharp/dull at L4 dermatome on lower leg on LLE. She is able to ambulate short distances with RW with antalgic gait pattern with increased WB in BUE during left stance. She would benefit from skilled PT Intervention to improve ankle ROM, strength and return to PLOF.    Personal Factors and Comorbidities Comorbidity 3+;Past/Current Experience;Time since onset of injury/illness/exacerbation    Comorbidities anxiety, arthritis, DMx2, fibromuscular dysplasia, HTN,    Examination-Activity Limitations Caring for Others;Carry;Locomotion Level;Squat;Stairs;Stand;Transfers     Examination-Participation Restrictions Cleaning;Community Activity;Occupation;Shop;Volunteer;Yard Work    Stability/Clinical Decision Making Stable/Uncomplicated    Clinical Decision Making Low    Rehab Potential Good    PT Frequency 2x / week    PT Duration 8 weeks    PT Treatment/Interventions Cryotherapy;Electrical Stimulation;Moist Heat;Ultrasound;DME Instruction;Gait training;Stair training;Functional mobility training;Therapeutic activities;Therapeutic exercise;Balance training;Neuromuscular re-education;Patient/family education;Manual techniques;Scar mobilization;Passive range of motion;Dry needling;Energy conservation;Taping    PT Next Visit Plan advance HEP, work on ROM/strengthening, check walker safety    PT Home Exercise Plan initiated    Consulted and Agree with Plan of Care Patient           Patient will benefit from skilled therapeutic intervention in order to improve the following deficits and impairments:  Abnormal gait,Decreased balance,Decreased endurance,Decreased mobility,Difficulty walking,Impaired sensation,Decreased range of motion,Decreased activity tolerance,Decreased strength,Increased fascial restricitons,Impaired flexibility,Pain  Visit Diagnosis: Muscle weakness (generalized)  Difficulty in walking, not elsewhere classified  Pain in left ankle and joints of left foot     Problem List Patient Active Problem List   Diagnosis Date Noted  . Ankle injury, sequela 06/07/2020  . Personal history of COVID-19 05/28/2020  . Diabetic nephropathy associated with type 2 diabetes mellitus (HCC) 10/21/2019  . Renal artery stenosis (HCC) 10/27/2018  . Hypertensive emergency   . Thoracic aortic aneurysm without rupture (HCC)   .  Chest pain 03/10/2018  . Paroxysmal tachycardia (HCC) 08/12/2017  . Thyroid nodule 05/15/2017  . Ascending aorta dilatation (HCC) 05/27/2016  . Bicuspid aortic valve 05/27/2016  . Encounter for preventive health examination 10/29/2015  .  Adjustment disorder with anxious mood 06/13/2014  . Diabetes mellitus type 2 in obese (HCC) 03/25/2012  . Aortic stenosis   . Screening for colon cancer 08/25/2011  . Screening for breast cancer 08/25/2011  . Overweight 08/25/2011  . Hyperlipidemia associated with type 2 diabetes mellitus (HCC) 08/25/2011  . Cervical spine disease 06/18/2011  . Hypertension 06/18/2011    Lorraine Terriquez PT, DPT 10/11/2020, 3:08 PM  [facility] [facility] MAIN [facility] SERVICES [address] [place], [place], [address] Phone: [phone]   Fax:  [phone]  Name: KATARZYNA WOLVEN MRN: [id number] Date of Birth: 1960-12-07

## 2020-10-12 ENCOUNTER — Other Ambulatory Visit: Payer: Self-pay

## 2020-10-12 MED FILL — Alprazolam Tab 0.25 MG: ORAL | 30 days supply | Qty: 60 | Fill #1 | Status: AC

## 2020-10-12 NOTE — Addendum Note (Signed)
Addended by: Zettie Pho E on: 10/12/2020 11:12 AM   Modules accepted: Orders

## 2020-10-16 ENCOUNTER — Ambulatory Visit: Payer: PRIVATE HEALTH INSURANCE | Admitting: Physical Therapy

## 2020-10-16 ENCOUNTER — Other Ambulatory Visit: Payer: Self-pay

## 2020-10-16 ENCOUNTER — Encounter: Payer: Self-pay | Admitting: Physical Therapy

## 2020-10-16 DIAGNOSIS — M6281 Muscle weakness (generalized): Secondary | ICD-10-CM | POA: Diagnosis not present

## 2020-10-16 DIAGNOSIS — M25572 Pain in left ankle and joints of left foot: Secondary | ICD-10-CM

## 2020-10-16 DIAGNOSIS — R262 Difficulty in walking, not elsewhere classified: Secondary | ICD-10-CM

## 2020-10-16 NOTE — Patient Instructions (Signed)
Access Code: [email or web address] URL: [email or web address] Date: 10/16/2020 Prepared by: Zettie Pho  Exercises Seated Toe Towel Scrunches - 1 x daily - 7 x weekly - 1 sets - 2-3 reps Seated Heel Toe Raises - 1 x daily - 7 x weekly - 1-2 sets - 15 reps Seated Ankle Plantarflexion with Resistance - 1 x daily - 7 x weekly - 1-2 sets - 15 reps

## 2020-10-16 NOTE — Therapy (Signed)
[facility] [facility] MAIN [facility] SERVICES [address] [place], [place], [address] Phone: [phone]   Fax:  [phone]  Physical Therapy Treatment  Patient Details  Name: Courtney Lopez MRN: [id number] Date of Birth: 09-21-1960 Referring Provider (PT): Dr. Dub Mikes   Encounter Date: 10/16/2020   PT End of Session - 10/16/20 1103    Visit Number 2    Number of Visits 17    Date for PT Re-Evaluation 12/06/20    Authorization Type Worker's comp, 1 eval plus 12 visits    Authorization - Visit Number 1    Authorization - Number of Visits 12    PT Start Time 1102    PT Stop Time 1145    PT Time Calculation (min) 43 min    Equipment Utilized During Treatment Gait belt    Activity Tolerance Patient tolerated treatment well    Behavior During Therapy WFL for tasks assessed/performed           Past Medical History:  Diagnosis Date  . Ankle fracture 12/31/2018   left  . Anxiety   . Arthritis    neck, back, ankle, knees  . Ascending aortic aneurysm (HCC)    a. 04/2016 CTA: 4.6cm Asc TAA; b. 04/2018 CTA: 4.8cm; c. 10/2018 CTA: 4.9x4.8cm; d. 12/2019 Echo: 4.8 cm.  . Bicuspid aortic valve   . Breast screening, unspecified   . Carotid arterial disease (HCC)    a. 01/2020 U/S: 1-39% bilat ICA stenosis. No evidence of FMD.  . Cervicalgia    cervical stenosis  . Diabetes mellitus without complication (HCC)   . Elevated coronary artery calcium score    a. 03/2016 Cardiac CT: Ca2+ = 78 (90th %'ile).  . Fibromuscular dysplasia (HCC)   . Heart murmur   . History of hiatal hernia   . Metatarsal fracture 2007   5th, from wearing high heels  . Moderate aortic stenosis    a. 12/2019 Echo: EF 60-65%, no rwma, mild LVH, gr1 DD, nl RV size/fxn, triv MR, mild AI, mod AS, mod-sev dil of Asc Ao - 74mm.  AoV reported as tricuspid but prev known to be bicuspid.  . Multiple thyroid nodules    a. 10/2019 U/S: R mid thyroid nodule - f/u 5 yrs.  . Other  symptoms involving abdomen and pelvis(789.9)   . Renal artery stenosis (HCC)    a. 12/2019 Renal duplex: Nl RRA. LRA >60% stenosis.  . Renal cyst, right    a. 12/2019 Duplex: 5.8 x 5.7 cm R renal cyst in lower pole.  [name] Retinal hemorrhage   . Symptomatic menopausal or female climacteric states   . Unspecified essential hypertension   . Wears contact lenses     Past Surgical History:  Procedure Laterality Date  . ABDOMINAL HYSTERECTOMY    . ANKLE ARTHROSCOPY WITH RECONSTRUCTION Left 08/24/2020   Procedure: LEFT ANKLE ARTHROSCOPIC DEBRIDEMENT, EXTENSIVE, ARTHOSCROPIC TREATMENT OF TALUS OSTEOCHONDRAL LESION, LATERAL LIGAMENT RECONSTRUCTION;  Surgeon: Terance Hart, MD;  Location: [facility];  Service: Orthopedics;  Laterality: Left;  . APPENDECTOMY     done during oophorectomy  . CESAREAN SECTION     x 2  . ETHMOIDECTOMY Left 03/02/2020   Procedure: ETHMOIDECTOMY;  Surgeon: Vernie Murders, MD;  Location: [facility];  Service: ENT;  Laterality: Left;  . gyn surgery     hysterectomy- done for adenomysosis and endometriosis  . IMAGE GUIDED SINUS SURGERY N/A 03/02/2020   Procedure: IMAGE GUIDED SINUS SURGERY;  Surgeon:  Vernie Murders, MD;  Location: [facility];  Service: ENT;  Laterality: N/A;  need disk PLACED DISK ON OR CHARGE NURSE DESK 9-17 KP  . IMAGE GUIDED SINUS SURGERY Left 04/13/2020   Procedure: IMAGE GUIDED SINUS SURGERY;  Surgeon: Vernie Murders, MD;  Location: [facility];  Service: ENT;  Laterality: Left;  needs stryker disk per Gloriajean Dell use same disk as before (October) put on charge nurses desk. DS 04/03/20  . MAXILLARY ANTROSTOMY Left 03/02/2020   Procedure: MAXILLARY ANTROSTOMY;  Surgeon: Vernie Murders, MD;  Location: [facility];  Service: ENT;  Laterality: Left;  [name] MAXILLARY ANTROSTOMY Left 04/13/2020   Procedure: MAXILLECTOMY;  Surgeon: Vernie Murders, MD;  Location: [facility];  Service: ENT;  Laterality: Left;  .  OOPHORECTOMY     unilateral, due to ruptured ovarian cysts  . OSTEOTOMY MAXILLARY     due to bite problems  . TUBAL LIGATION      There were no vitals filed for this visit.   Subjective Assessment - 10/16/20 1105    Subjective Patient reports going to [employer] for shopping but did ride in the cart. She reports she has been using RW at home with good results.    Pertinent History Courtney Lopez is a 60 y.o. female s/p left ankle arthroscope for osteochondral lesions due to left ankle instability.  She initially sustained an injury back in August 2020 where she had a nondisplaced fracture of her fibula.  She healed this uneventfully but then had a reinjury approximately 1 year later with resultant instability despite conservative treatment.  X-rays and MRI scan revealed an osteochondral lesion on the medial talar dome.  Prior x-rays from several years ago demonstrated similar lesion but not as severe. Symptoms are rated as moderate to severe, and have been worsening.  This is significantly impairing activities of daily living.  She is s/p surgery on 08/24/20. Per Order, patient is WBAT in boot. Patient presents to therapy with knee walker. She reports while at work she is able to use the knee scooter for 20% of the time and then the rest of the time she is supposed to be sitting. She did start using a crutch over the weekend. reports some numbness/tingling down LLE into foot/ankle;    Currently in Pain? Yes    Pain Score 4     Pain Location Ankle    Pain Orientation Left    Pain Descriptors / Indicators Aching;Tightness    Pain Type Chronic pain    Pain Onset More than a month ago    Pain Frequency Intermittent    Aggravating Factors  prolonged standing, swelling/working    Pain Relieving Factors elevating/reducing swelling    Effect of Pain on Daily Activities decreased activity tolerance;    Multiple Pain Sites No            TREATMENT: Manual therapy:  PT performed gentle talocrual  joint mobilizations grade II 10 sec bouts , 10 sec rest x5 min PT performed passive LLE ankle DF with overpressure 10 sec hold x5 reps; PT performed passive calcaneal IV/EV x10 reps each with overpressure to tolerance;  Educated patient in heel inserts to increase tolerance while walking in boot.   PT instructed patient isometric ankle DF, IV/EV 5 sec hold x10 reps; Instructed patient in LLE ankle PF green tband x20 reps;   Long sitting, LLE ankle circles clockwise/counterclockwise x5 reps each with min VCS to isolate ankle ROM;  BAPS board, level 3 LLE  only: DF/PF x10, IV/EV x10, patient required min VCs to isolate ankle ROM with less hip/knee movement;  Seated ankle DF/PF x10 reps with cues to increase ROM to tolerance Seated towel scrunches with toe flexion/extension x2 min each; Patient required min-moderate verbal/tactile cues for correct exercise technique.   Advanced HEP- see patient instructions;  Patient tolerated session well. She exhibits improved ankle ROM and reports feeling less stiffness at end of session. Patient verbalized understanding of HEP;   [facility] PT Assessment - 10/16/20 0001      AROM   Left Ankle Dorsiflexion 10    Left Ankle Plantar Flexion 55                                 PT Education - 10/16/20 1103    Education Details ROM/strengthening, HEP    Person(s) Educated Patient    Methods Explanation;Verbal cues    Comprehension Verbalized understanding;Returned demonstration;Verbal cues required;Need further instruction            PT Short Term Goals - 10/11/20 1504      PT SHORT TERM GOAL #1   Title Patient will be adherent to HEP at least 3x a week to improve functional strength and balance for better safety at home.    Time 4    Period Weeks    Status New    Target Date 11/08/20      PT SHORT TERM GOAL #2   Title Patient will improve LLE ankle AROM: DF: 10 degrees, PF: 60 degrees, IV: 30 degrees, EV: 10 degrees to  improve ankle ROM for ADLs including gait and transfers;    Time 4    Period Weeks    Status New    Target Date 11/08/20             PT Long Term Goals - 10/11/20 1505      PT LONG TERM GOAL #1   Title Patient will increase 10 meter walk test to >1.6370m/s as to improve gait speed for better community ambulation and to reduce fall risk.    Time 8    Period Weeks    Status New    Target Date 12/06/20      PT LONG TERM GOAL #2   Title Patient will be independent with ascend/descend 8 steps using single UE in step over step pattern without LOB.    Time 8    Period Weeks    Status New    Target Date 12/06/20      PT LONG TERM GOAL #3   Title Patient will improve LLE ankle strength to grossly 4/5 to indicate improved strength for gait and walking tasks.    Time 8    Period Weeks    Status New    Target Date 12/06/20      PT LONG TERM GOAL #4   Title Patient will tolerate 5 seconds of single leg stance without loss of balance to improve ability to get in and out of shower safely.    Time 8    Period Weeks    Status New    Target Date 12/06/20      PT LONG TERM GOAL #5   Title Patient will improve FOTO score to >60% to indicate improved functional mobility with ADLs.    Time 8    Period Weeks    Status New    Target Date 12/06/20  Additional Long Term Goals   Additional Long Term Goals Yes      PT LONG TERM GOAL #6   Title Patient will report a worst pain of 3/10 on VAS in   left ankle          to improve tolerance with ADLs and reduced symptoms with activities.    Time 8    Period Weeks    Status New    Target Date 12/06/20                 Plan - 10/16/20 1302    Clinical Impression Statement Patient tolerated session well. She reports less stiffness after manual therapy with improved joint mobility. Patient instructed in advanced LE strengthening exercise. She reports minimal pain with advanced exercise. She does require min VCs for proper exercise  technique. Patient would benefit from additional skilled PT intervention to improve strength and mobility;    Personal Factors and Comorbidities Comorbidity 3+;Past/Current Experience;Time since onset of injury/illness/exacerbation    Comorbidities anxiety, arthritis, DMx2, fibromuscular dysplasia, HTN,    Examination-Activity Limitations Caring for Others;Carry;Locomotion Level;Squat;Stairs;Stand;Transfers    Examination-Participation Restrictions Cleaning;Community Activity;Occupation;Shop;Volunteer;Yard Work    Stability/Clinical Decision Making Stable/Uncomplicated    Rehab Potential Good    PT Frequency 2x / week    PT Duration 8 weeks    PT Treatment/Interventions Cryotherapy;Electrical Stimulation;Moist Heat;Ultrasound;DME Instruction;Gait training;Stair training;Functional mobility training;Therapeutic activities;Therapeutic exercise;Balance training;Neuromuscular re-education;Patient/family education;Manual techniques;Scar mobilization;Passive range of motion;Dry needling;Energy conservation;Taping    PT Next Visit Plan advance HEP, work on ROM/strengthening, check walker safety    PT Home Exercise Plan initiated    Consulted and Agree with Plan of Care Patient           Patient will benefit from skilled therapeutic intervention in order to improve the following deficits and impairments:  Abnormal gait,Decreased balance,Decreased endurance,Decreased mobility,Difficulty walking,Impaired sensation,Decreased range of motion,Decreased activity tolerance,Decreased strength,Increased fascial restricitons,Impaired flexibility,Pain  Visit Diagnosis: Muscle weakness (generalized)  Difficulty in walking, not elsewhere classified  Pain in left ankle and joints of left foot     Problem List Patient Active Problem List   Diagnosis Date Noted  . Ankle injury, sequela 06/07/2020  . Personal history of COVID-19 05/28/2020  . Diabetic nephropathy associated with type 2 diabetes mellitus  (HCC) 10/21/2019  . Renal artery stenosis (HCC) 10/27/2018  . Hypertensive emergency   . Thoracic aortic aneurysm without rupture (HCC)   . Chest pain 03/10/2018  . Paroxysmal tachycardia (HCC) 08/12/2017  . Thyroid nodule 05/15/2017  . Ascending aorta dilatation (HCC) 05/27/2016  . Bicuspid aortic valve 05/27/2016  . Encounter for preventive health examination 10/29/2015  . Adjustment disorder with anxious mood 06/13/2014  . Diabetes mellitus type 2 in obese (HCC) 03/25/2012  . Aortic stenosis   . Screening for colon cancer 08/25/2011  . Screening for breast cancer 08/25/2011  . Overweight 08/25/2011  . Hyperlipidemia associated with type 2 diabetes mellitus (HCC) 08/25/2011  . Cervical spine disease 06/18/2011  . Hypertension 06/18/2011    Warwick Nick PT, DPT 10/16/2020, 1:04 PM  [place] [facility] MAIN [facility] SERVICES [address] [place], [place], [address] Phone: [phone]   Fax:  [phone]  Name: Courtney Lopez MRN: [id number] Date of Birth: Sep 28, 1960

## 2020-10-18 ENCOUNTER — Ambulatory Visit: Payer: PRIVATE HEALTH INSURANCE | Admitting: Physical Therapy

## 2020-10-26 ENCOUNTER — Ambulatory Visit: Payer: PRIVATE HEALTH INSURANCE

## 2020-10-26 ENCOUNTER — Other Ambulatory Visit: Payer: Self-pay

## 2020-10-26 DIAGNOSIS — M6281 Muscle weakness (generalized): Secondary | ICD-10-CM

## 2020-10-26 DIAGNOSIS — R2689 Other abnormalities of gait and mobility: Secondary | ICD-10-CM

## 2020-10-26 DIAGNOSIS — R278 Other lack of coordination: Secondary | ICD-10-CM

## 2020-10-26 DIAGNOSIS — M25572 Pain in left ankle and joints of left foot: Secondary | ICD-10-CM

## 2020-10-26 NOTE — Therapy (Signed)
[facility] MAIN [facility] SERVICES [address] [place], [place], [address] Phone: [phone]   Fax:  [phone]  Physical Therapy Treatment  Patient Details  Name: Courtney Lopez MRN: [id number] Date of Birth: 02/18/1961 Referring Provider (PT): Dr. Dub Mikes   Encounter Date: 10/26/2020   PT End of Session - 10/26/20 1751     Visit Number 3    Number of Visits 17    Date for PT Re-Evaluation 12/06/20    Authorization Type Worker's comp, 1 eval plus 12 visits    Authorization - Visit Number 1    Authorization - Number of Visits 12    PT Start Time 1101    PT Stop Time 1144    PT Time Calculation (min) 43 min    Equipment Utilized During Treatment --    Activity Tolerance Patient tolerated treatment well    Behavior During Therapy [facility] for tasks assessed/performed             Past Medical History:  Diagnosis Date   Ankle fracture 12/31/2018   left   Anxiety    Arthritis    neck, back, ankle, knees   Ascending aortic aneurysm (HCC)    a. 04/2016 CTA: 4.6cm Asc TAA; b. 04/2018 CTA: 4.8cm; c. 10/2018 CTA: 4.9x4.8cm; d. 12/2019 Echo: 4.8 cm.   Bicuspid aortic valve    Breast screening, unspecified    Carotid arterial disease (HCC)    a. 01/2020 U/S: 1-39% bilat ICA stenosis. No evidence of FMD.   Cervicalgia    cervical stenosis   Diabetes mellitus without complication (HCC)    Elevated coronary artery calcium score    a. 03/2016 Cardiac CT: Ca2+ = 78 (90th %'ile).   Fibromuscular dysplasia (HCC)    Heart murmur    History of hiatal hernia    Metatarsal fracture 2007   5th, from wearing high heels   Moderate aortic stenosis    a. 12/2019 Echo: EF 60-65%, no rwma, mild LVH, gr1 DD, nl RV size/fxn, triv MR, mild AI, mod AS, mod-sev dil of Asc Ao - 58mm.  AoV reported as tricuspid but prev known to be bicuspid.   Multiple thyroid nodules    a. 10/2019 U/S: R mid thyroid nodule - f/u 5 yrs.   Other symptoms involving  abdomen and pelvis(789.9)    Renal artery stenosis (HCC)    a. 12/2019 Renal duplex: Nl RRA. LRA >60% stenosis.   Renal cyst, right    a. 12/2019 Duplex: 5.8 x 5.7 cm R renal cyst in lower pole.   Retinal hemorrhage    Symptomatic menopausal or female climacteric states    Unspecified essential hypertension    Wears contact lenses     Past Surgical History:  Procedure Laterality Date   ABDOMINAL HYSTERECTOMY     ANKLE ARTHROSCOPY WITH RECONSTRUCTION Left 08/24/2020   Procedure: LEFT ANKLE ARTHROSCOPIC DEBRIDEMENT, EXTENSIVE, ARTHOSCROPIC TREATMENT OF TALUS OSTEOCHONDRAL LESION, LATERAL LIGAMENT RECONSTRUCTION;  Surgeon: Terance Hart, MD;  Location: [facility];  Service: Orthopedics;  Laterality: Left;   APPENDECTOMY     done during oophorectomy   CESAREAN SECTION     x 2   ETHMOIDECTOMY Left 03/02/2020   Procedure: ETHMOIDECTOMY;  Surgeon: Vernie Murders, MD;  Location: [facility];  Service: ENT;  Laterality: Left;   gyn surgery     hysterectomy- done for adenomysosis and endometriosis   IMAGE GUIDED SINUS SURGERY N/A 03/02/2020   Procedure: IMAGE GUIDED SINUS SURGERY;  Surgeon: Vernie Murders, MD;  Location: [facility];  Service: ENT;  Laterality: N/A;  need disk PLACED DISK ON OR CHARGE NURSE DESK 9-17 KP   IMAGE GUIDED SINUS SURGERY Left 04/13/2020   Procedure: IMAGE GUIDED SINUS SURGERY;  Surgeon: Vernie Murders, MD;  Location: [facility];  Service: ENT;  Laterality: Left;  needs [employer]ker disk per Gloriajean Dell use same disk as before (October) put on charge nurses desk. DS 04/03/20   MAXILLARY ANTROSTOMY Left 03/02/2020   Procedure: MAXILLARY ANTROSTOMY;  Surgeon: Vernie Murders, MD;  Location: [facility];  Service: ENT;  Laterality: Left;   MAXILLARY ANTROSTOMY Left 04/13/2020   Procedure: MAXILLECTOMY;  Surgeon: Vernie Murders, MD;  Location: [facility];  Service: ENT;  Laterality: Left;   OOPHORECTOMY     unilateral, due to  ruptured ovarian cysts   OSTEOTOMY MAXILLARY     due to bite problems   TUBAL LIGATION      There were no vitals filed for this visit.   Subjective Assessment - 10/26/20 1058     Subjective Pt reports L ankle pain is currently 2/10. She feels like things are "starting to turn around." Pt reports using walker and crutch at home. She says that's going well. Pt is now wearing shoe insert under heel and reports it has made a big improvement in her pain.    Pertinent History Courtney Lopez is a 60 y.o. female s/p left ankle arthroscope for osteochondral lesions due to left ankle instability.  She initially sustained an injury back in August 2020 where she had a nondisplaced fracture of her fibula.  She healed this uneventfully but then had a reinjury approximately 1 year later with resultant instability despite conservative treatment.  X-rays and MRI scan revealed an osteochondral lesion on the medial talar dome.  Prior x-rays from several years ago demonstrated similar lesion but not as severe. Symptoms are rated as moderate to severe, and have been worsening.  This is significantly impairing activities of daily living.  She is s/p surgery on 08/24/20. Per Order, patient is WBAT in boot. Patient presents to therapy with knee walker. She reports while at work she is able to use the knee scooter for 20% of the time and then the rest of the time she is supposed to be sitting. She did start using a crutch over the weekend. reports some numbness/tingling down LLE into foot/ankle;    Currently in Pain? Yes    Pain Score 2     Pain Location Ankle    Pain Orientation Left    Pain Onset More than a month ago             TREATMENT: at end of session pt reports 1/10 pain  Therex:  In long sitting on plinth: LLE ankle circles clockwise/counterclockwise 5x, alphabet 1x  Isometric L PF and DF for 10 sec holds, following isometric PT assisted pt into further ROM of opposite motion.   L ankle  anterior tib and gastroc sol stretches (assisted) 2x2 min each  AAROM L ankle inversion/eversion - x multiple reps each, frequent cuing for technique.   L ankle inversion/eversion isometrics - 2x5x10 sec holds each against red ball. Frequent TC/VC for technique. Pt improves technique with reps.  Seated towel scrunches -  2 min; pt rates as easy  Toe extension with PT applying manual resistance - 15x  Seated, pt with LLE on yellow dynadisc moving ankle into PF/DF, inversion/eversion and then CW/CC movements - 10-15x  for each   Manual therapy:  PT performed gentle talocrual joint mobilizations grade II 10 sec bouts , 10 sec rest x5 min  PT performed passive LLE ankle DF and PF with overpressure 10 sec hold x5 reps;  PT performed passive calcaneal IV/EV 2x10-15 reps each with overpressure to tolerance;  PT performed passive L ankle inversion/eversion - x multiple reps each   Pt educated throughout session about proper posture and technique with exercises. Improved exercise technique, movement at target joints, use of target muscles after min to mod verbal, visual, tactile cues      PT Education - 10/26/20 1751     Education Details exercise techinque, body mechanics    Person(s) Educated Patient    Methods Explanation;Demonstration;Tactile cues;Verbal cues    Comprehension Verbalized understanding;Returned demonstration;Need further instruction              PT Short Term Goals - 10/11/20 1504       PT SHORT TERM GOAL #1   Title Patient will be adherent to HEP at least 3x a week to improve functional strength and balance for better safety at home.    Time 4    Period Weeks    Status New    Target Date 11/08/20      PT SHORT TERM GOAL #2   Title Patient will improve LLE ankle AROM: DF: 10 degrees, PF: 60 degrees, IV: 30 degrees, EV: 10 degrees to improve ankle ROM for ADLs including gait and transfers;    Time 4    Period Weeks    Status New    Target Date 11/08/20                PT Long Term Goals - 10/11/20 1505       PT LONG TERM GOAL #1   Title Patient will increase 10 meter walk test to >1.5985m/s as to improve gait speed for better community ambulation and to reduce fall risk.    Time 8    Period Weeks    Status New    Target Date 12/06/20      PT LONG TERM GOAL #2   Title Patient will be independent with ascend/descend 8 steps using single UE in step over step pattern without LOB.    Time 8    Period Weeks    Status New    Target Date 12/06/20      PT LONG TERM GOAL #3   Title Patient will improve LLE ankle strength to grossly 4/5 to indicate improved strength for gait and walking tasks.    Time 8    Period Weeks    Status New    Target Date 12/06/20      PT LONG TERM GOAL #4   Title Patient will tolerate 5 seconds of single leg stance without loss of balance to improve ability to get in and out of shower safely.    Time 8    Period Weeks    Status New    Target Date 12/06/20      PT LONG TERM GOAL #5   Title Patient will improve FOTO score to >60% to indicate improved functional mobility with ADLs.    Time 8    Period Weeks    Status New    Target Date 12/06/20      Additional Long Term Goals   Additional Long Term Goals Yes      PT LONG TERM GOAL #6   Title Patient will report  a worst pain of 3/10 on VAS in   left ankle          to improve tolerance with ADLs and reduced symptoms with activities.    Time 8    Period Weeks    Status New    Target Date 12/06/20                   Plan - 10/26/20 1752     Clinical Impression Statement Pt tolerated session well with reports of decreased LLE pain to 1/10 at end of session. She continues to respond well to manual therapy and motor control training with hands-on instruction, where she exhbited improvement in ankle inversion/eversion. The pt will benefit from further skilledPT to improve strength and mobility in order to return to PLOF.    Personal Factors and  Comorbidities Comorbidity 3+;Past/Current Experience;Time since onset of injury/illness/exacerbation    Comorbidities anxiety, arthritis, DMx2, fibromuscular dysplasia, HTN,    Examination-Activity Limitations Caring for Others;Carry;Locomotion Level;Squat;Stairs;Stand;Transfers    Examination-Participation Restrictions Cleaning;Community Activity;Occupation;Shop;Volunteer;Yard Work    Stability/Clinical Decision Making Stable/Uncomplicated    Rehab Potential Good    PT Frequency 2x / week    PT Duration 8 weeks    PT Treatment/Interventions Cryotherapy;Electrical Stimulation;Moist Heat;Ultrasound;DME Instruction;Gait training;Stair training;Functional mobility training;Therapeutic activities;Therapeutic exercise;Balance training;Neuromuscular re-education;Patient/family education;Manual techniques;Scar mobilization;Passive range of motion;Dry needling;Energy conservation;Taping    PT Next Visit Plan advance HEP, work on ROM/strengthening, check walker safety    PT Home Exercise Plan initiated    Consulted and Agree with Plan of Care Patient             Patient will benefit from skilled therapeutic intervention in order to improve the following deficits and impairments:  Abnormal gait, Decreased balance, Decreased endurance, Decreased mobility, Difficulty walking, Impaired sensation, Decreased range of motion, Decreased activity tolerance, Decreased strength, Increased fascial restricitons, Impaired flexibility, Pain  Visit Diagnosis: Muscle weakness (generalized)  Other lack of coordination  Other abnormalities of gait and mobility  Pain in left ankle and joints of left foot     Problem List Patient Active Problem List   Diagnosis Date Noted   Ankle injury, sequela 06/07/2020   Personal history of COVID-19 05/28/2020   Diabetic nephropathy associated with type 2 diabetes mellitus (HCC) 10/21/2019   Renal artery stenosis (HCC) 10/27/2018   Hypertensive emergency    Thoracic  aortic aneurysm without rupture (HCC)    Chest pain 03/10/2018   Paroxysmal tachycardia (HCC) 08/12/2017   Thyroid nodule 05/15/2017   Ascending aorta dilatation (HCC) 05/27/2016   Bicuspid aortic valve 05/27/2016   Encounter for preventive health examination 10/29/2015   Adjustment disorder with anxious mood 06/13/2014   Diabetes mellitus type 2 in obese (HCC) 03/25/2012   Aortic stenosis    Screening for colon cancer 08/25/2011   Screening for breast cancer 08/25/2011   Overweight 08/25/2011   Hyperlipidemia associated with type 2 diabetes mellitus (HCC) 08/25/2011   Cervical spine disease 06/18/2011   Hypertension 06/18/2011   Temple Pacini PT, DPT 10/26/2020, 6:05 PM  [facility] MAIN [facility] SERVICES [address] [place], [place], [address] Phone: [phone]   Fax:  [phone]  Name: Courtney Lopez MRN: [id number] Date of Birth: March 04, 1961

## 2020-10-27 ENCOUNTER — Ambulatory Visit: Payer: PRIVATE HEALTH INSURANCE

## 2020-10-27 DIAGNOSIS — R262 Difficulty in walking, not elsewhere classified: Secondary | ICD-10-CM

## 2020-10-27 DIAGNOSIS — R269 Unspecified abnormalities of gait and mobility: Secondary | ICD-10-CM

## 2020-10-27 DIAGNOSIS — R278 Other lack of coordination: Secondary | ICD-10-CM

## 2020-10-27 DIAGNOSIS — M6281 Muscle weakness (generalized): Secondary | ICD-10-CM | POA: Diagnosis not present

## 2020-10-27 NOTE — Therapy (Signed)
[place] [facility] [address] [place], [place], [address] Phone: [phone]   Fax:  [phone]  Physical Therapy Treatment  Patient Details  Name: Courtney SenterStephanie E Grisby MRN: [id number] Date of Birth: 12/29/1960 Referring Provider (PT): Dr. Dub Mikeshristopher Adair   Encounter Date: 10/27/2020   PT End of Session - 10/27/20 1200     Visit Number 4    Number of Visits 17    Date for PT Re-Evaluation 12/06/20    Authorization Type Worker's comp, 1 eval plus 12 visits    Authorization - Visit Number 1    Authorization - Number of Visits 12    PT Start Time 0845    PT Stop Time 0929    PT Time Calculation (min) 44 min    Activity Tolerance Patient tolerated treatment well    Behavior During Therapy [facility] for tasks assessed/performed             Past Medical History:  Diagnosis Date   Ankle fracture 12/31/2018   left   Anxiety    Arthritis    neck, back, ankle, knees   Ascending aortic aneurysm (HCC)    a. 04/2016 CTA: 4.6cm Asc TAA; b. 04/2018 CTA: 4.8cm; c. 10/2018 CTA: 4.9x4.8cm; d. 12/2019 Echo: 4.8 cm.   Bicuspid aortic valve    Breast screening, unspecified    Carotid arterial disease (HCC)    a. 01/2020 U/S: 1-39% bilat ICA stenosis. No evidence of FMD.   Cervicalgia    cervical stenosis   Diabetes mellitus without complication (HCC)    Elevated coronary artery calcium score    a. 03/2016 Cardiac CT: Ca2+ = 78 (90th %'ile).   Fibromuscular dysplasia (HCC)    Heart murmur    History of hiatal hernia    Metatarsal fracture 2007   5th, from wearing high heels   Moderate aortic stenosis    a. 12/2019 Echo: EF 60-65%, no rwma, mild LVH, gr1 DD, nl RV size/fxn, triv MR, mild AI, mod AS, mod-sev dil of Asc Ao - 48mm.  AoV reported as tricuspid but prev known to be bicuspid.   Multiple thyroid nodules    a. 10/2019 U/S: R mid thyroid nodule - f/u 5 yrs.   Other symptoms involving abdomen and pelvis(789.9)    Renal artery  stenosis (HCC)    a. 12/2019 Renal duplex: Nl RRA. LRA >60% stenosis.   Renal cyst, right    a. 12/2019 Duplex: 5.8 x 5.7 cm R renal cyst in lower pole.   Retinal hemorrhage    Symptomatic menopausal or female climacteric states    Unspecified essential hypertension    Wears contact lenses     Past Surgical History:  Procedure Laterality Date   ABDOMINAL HYSTERECTOMY     ANKLE ARTHROSCOPY WITH RECONSTRUCTION Left 08/24/2020   Procedure: LEFT ANKLE ARTHROSCOPIC DEBRIDEMENT, EXTENSIVE, ARTHOSCROPIC TREATMENT OF TALUS OSTEOCHONDRAL LESION, LATERAL LIGAMENT RECONSTRUCTION;  Surgeon: Terance HartAdair, Christopher R, MD;  Location:  SURGERY CENTER;  Service: Orthopedics;  Laterality: Left;   APPENDECTOMY     done during oophorectomy   CESAREAN SECTION     x 2   ETHMOIDECTOMY Left 03/02/2020   Procedure: ETHMOIDECTOMY;  Surgeon: Vernie MurdersJuengel, Paul, MD;  [facility];  Service: ENT;  Laterality: Left;   gyn surgery     hysterectomy- done for adenomysosis and endometriosis   IMAGE GUIDED SINUS SURGERY N/A 03/02/2020   Procedure: IMAGE GUIDED SINUS SURGERY;  Surgeon: Vernie MurdersJuengel, Paul, MD;  [facility];  Service: ENT;  Laterality: N/A;  need disk PLACED DISK ON OR CHARGE NURSE DESK 9-17 KP   IMAGE GUIDED SINUS SURGERY Left 04/13/2020   Procedure: IMAGE GUIDED SINUS SURGERY;  Surgeon: Vernie Murders, MD;  Location[facility];  Service: ENT;  Laterality: Left;  needs [employer] disk per Gloriajean Dell use same disk as before (October) put on charge nurses desk. DS 04/03/20   MAXILLARY ANTROSTOMY Left 03/02/2020   Procedure: MAXILLARY ANTROSTOMY;  Surgeon: Vernie Murders, MD;  [facility]: [facility];  Service: ENT;  Laterality: Left;   MAXILLARY ANTROSTOMY Left 04/13/2020   Procedure: MAXILLECTOMY;  Surgeon: Vernie Murders, MD;  Location[facility];  Service: ENT;  Laterality: Left;   OOPHORECTOMY     unilateral, due to ruptured ovarian cysts   OSTEOTOMY  MAXILLARY     due to bite problems   TUBAL LIGATION      There were no vitals filed for this visit.   Subjective Assessment - 10/27/20 0849     Subjective I can tell i did something last visit cause I was a little sore. Overall I am doing well and seeing the benefit of PT and doing better overall.    Pertinent History Courtney Lopez is a 60 y.o. female s/p left ankle arthroscope for osteochondral lesions due to left ankle instability.  She initially sustained an injury back in August 2020 where she had a nondisplaced fracture of her fibula.  She healed this uneventfully but then had a reinjury approximately 1 year later with resultant instability despite conservative treatment.  X-rays and MRI scan revealed an osteochondral lesion on the medial talar dome.  Prior x-rays from several years ago demonstrated similar lesion but not as severe. Symptoms are rated as moderate to severe, and have been worsening.  This is significantly impairing activities of daily living.  She is s/p surgery on 08/24/20. Per Order, patient is WBAT in boot. Patient presents to therapy with knee walker. She reports while at work she is able to use the knee scooter for 20% of the time and then the rest of the time she is supposed to be sitting. She did start using a crutch over the weekend. reports some numbness/tingling down LLE into foot/ankle;    Currently in Pain? Yes    Pain Score 3     Pain Location Ankle    Pain Orientation Left    Pain Descriptors / Indicators Aching    Pain Type Chronic pain    Pain Onset More than a month ago    Pain Frequency Intermittent    Aggravating Factors  Prolonged standing/weightbearing, Increased swelling    Effect of Pain on Daily Activities Decreased ability to walk and work.    Multiple Pain Sites No             Pre-treatment AROM:   Ankle Dorsiflex= 12 deg  Ankle Plantarflex = 22 deg   Manual therapy:  PT performed gentle PA/AP Talocrual joint mobilizations  grade II 10 sec bouts , 10 sec rest x5 min  PT performed passive LLE ankle DF and PF with overpressure 10 sec hold x5 reps;  PT performed passive calcaneal IV/EV 2 x 10 reps each with overpressure to tolerance  PT performed passive L ankle inversion/eversion - x approx 20  reps each    Therex:  Patient positioned in long sitting on mat table with left foot off table:   Instructed in self stretching using towel to increase Left  LE DF - hold 30 sec x 3 sets- Patient required VC and visual demo for correct technique.   Isometric L PF and DF for 10 sec holds, following isometric PT assisted pt into further ROM of opposite motion x 5 sets each direction  L ankle anterior tib and gastroc sol stretches (assisted) 2x2 min each  AAROM L ankle inversion/eversion - x approx 20 reps each, frequent cuing for technique.    Seated towel scrunches -  2 min; pt rates as easy  Seated, pt with LLE on yellow dynadisc moving ankle into PF/DF, inversion/eversion and then CW/CC movements - 10-15x for each  A/P positioned rockerboard- AROM- x 20 reps then positioned for lateral rockerboard for ankle IV/EV x 20 reps.    Pt educated throughout session about proper posture and technique with exercises. Improved exercise technique, movement at target joints, use of target muscles after min to mod verbal, visual, tactile cues   Remeasured ankle ROM at end of session:   Ankle DF= 16 deg Ankle PF= 33 deg Ankle IV= 10 deg Ankle EV= 6 deg *Patient reported feeling good after session with no increased  pain  Clinical Impression: Patient responded well to manual stretching and continuation/progression of Ankle ROM/strengthening today. She reported feeling better after session and presents with improved ROM. Patient would benefit from additional skilled PT intervention to improve strength and mobility;                      PT Education - 10/27/20 1159     Education Details Exercise form  education    Person(s) Educated Patient    Methods Explanation;Demonstration;Verbal cues    Comprehension Verbalized understanding;Returned demonstration;Verbal cues required;Need further instruction              PT Short Term Goals - 10/11/20 1504       PT SHORT TERM GOAL #1   Title Patient will be adherent to HEP at least 3x a week to improve functional strength and balance for better safety at home.    Time 4    Period Weeks    Status New    Target Date 11/08/20      PT SHORT TERM GOAL #2   Title Patient will improve LLE ankle AROM: DF: 10 degrees, PF: 60 degrees, IV: 30 degrees, EV: 10 degrees to improve ankle ROM for ADLs including gait and transfers;    Time 4    Period Weeks    Status New    Target Date 11/08/20               PT Long Term Goals - 10/11/20 1505       PT LONG TERM GOAL #1   Title Patient will increase 10 meter walk test to >1.64m/s as to improve gait speed for better community ambulation and to reduce fall risk.    Time 8    Period Weeks    Status New    Target Date 12/06/20      PT LONG TERM GOAL #2   Title Patient will be independent with ascend/descend 8 steps using single UE in step over step pattern without LOB.    Time 8    Period Weeks    Status New    Target Date 12/06/20      PT LONG TERM GOAL #3   Title Patient will improve LLE ankle strength to grossly 4/5 to indicate improved strength for gait and walking tasks.  Time 8    Period Weeks    Status New    Target Date 12/06/20      PT LONG TERM GOAL #4   Title Patient will tolerate 5 seconds of single leg stance without loss of balance to improve ability to get in and out of shower safely.    Time 8    Period Weeks    Status New    Target Date 12/06/20      PT LONG TERM GOAL #5   Title Patient will improve FOTO score to >60% to indicate improved functional mobility with ADLs.    Time 8    Period Weeks    Status New    Target Date 12/06/20      Additional Long Term  Goals   Additional Long Term Goals Yes      PT LONG TERM GOAL #6   Title Patient will report a worst pain of 3/10 on VAS in   left ankle          to improve tolerance with ADLs and reduced symptoms with activities.    Time 8    Period Weeks    Status New    Target Date 12/06/20                   Plan - 10/27/20 1201     Clinical Impression Statement Patient responded well to manual stretching and continuation/progression of Ankle ROM/strengthening today. She reported feeling better after session and presents with improved ROM. Patient would benefit from additional skilled PT intervention to improve strength and mobility;    Personal Factors and Comorbidities Comorbidity 3+;Past/Current Experience;Time since onset of injury/illness/exacerbation    Comorbidities anxiety, arthritis, DMx2, fibromuscular dysplasia, HTN,    Examination-Activity Limitations Caring for Others;Carry;Locomotion Level;Squat;Stairs;Stand;Transfers    Examination-Participation Restrictions Cleaning;Community Activity;Occupation;Shop;Volunteer;Yard Work    Stability/Clinical Decision Making Stable/Uncomplicated    Rehab Potential Good    PT Frequency 2x / week    PT Duration 8 weeks    PT Treatment/Interventions Cryotherapy;Electrical Stimulation;Moist Heat;Ultrasound;DME Instruction;Gait training;Stair training;Functional mobility training;Therapeutic activities;Therapeutic exercise;Balance training;Neuromuscular re-education;Patient/family education;Manual techniques;Scar mobilization;Passive range of motion;Dry needling;Energy conservation;Taping    PT Next Visit Plan advance HEP, continue with ROM/strengthening    PT Home Exercise Plan Added ankle stretching into DF with towel    Consulted and Agree with Plan of Care Patient             Patient will benefit from skilled therapeutic intervention in order to improve the following deficits and impairments:  Abnormal gait, Decreased balance, Decreased  endurance, Decreased mobility, Difficulty walking, Impaired sensation, Decreased range of motion, Decreased activity tolerance, Decreased strength, Increased fascial restricitons, Impaired flexibility, Pain  Visit Diagnosis: Abnormality of gait and mobility  Difficulty in walking, not elsewhere classified  Other lack of coordination     Problem List Patient Active Problem List   Diagnosis Date Noted   Ankle injury, sequela 06/07/2020   Personal history of COVID-19 05/28/2020   Diabetic nephropathy associated with type 2 diabetes mellitus (HCC) 10/21/2019   Renal artery stenosis (HCC) 10/27/2018   Hypertensive emergency    Thoracic aortic aneurysm without rupture (HCC)    Chest pain 03/10/2018   Paroxysmal tachycardia (HCC) 08/12/2017   Thyroid nodule 05/15/2017   Ascending aorta dilatation (HCC) 05/27/2016   Bicuspid aortic valve 05/27/2016   Encounter for preventive health examination 10/29/2015   Adjustment disorder with anxious mood 06/13/2014   Diabetes mellitus type 2 in obese (HCC) 03/25/2012  Aortic stenosis    Screening for colon cancer 08/25/2011   Screening for breast cancer 08/25/2011   Overweight 08/25/2011   Hyperlipidemia associated with type 2 diabetes mellitus (HCC) 08/25/2011   Cervical spine disease 06/18/2011   Hypertension 06/18/2011    Lenda Kelp, PT 10/27/2020, 12:18 PM  [facility] MAIN [facility] SERVICES [address] [place], [place], [address] Phone: [phone]   Fax:  [phone]  Name: PRISCA GEARING MRN: [id number] Date of Birth: 07/04/1960

## 2020-10-30 ENCOUNTER — Ambulatory Visit: Payer: PRIVATE HEALTH INSURANCE | Admitting: Physical Therapy

## 2020-11-01 ENCOUNTER — Ambulatory Visit: Payer: PRIVATE HEALTH INSURANCE | Admitting: Physical Therapy

## 2020-11-06 ENCOUNTER — Ambulatory Visit: Payer: PRIVATE HEALTH INSURANCE | Admitting: Physical Therapy

## 2020-11-08 ENCOUNTER — Ambulatory Visit: Payer: 59 | Admitting: Physical Therapy

## 2020-11-10 ENCOUNTER — Other Ambulatory Visit: Payer: Self-pay

## 2020-11-10 ENCOUNTER — Other Ambulatory Visit: Payer: Self-pay | Admitting: Internal Medicine

## 2020-11-10 ENCOUNTER — Ambulatory Visit: Payer: PRIVATE HEALTH INSURANCE | Attending: Orthopaedic Surgery

## 2020-11-10 DIAGNOSIS — R269 Unspecified abnormalities of gait and mobility: Secondary | ICD-10-CM | POA: Insufficient documentation

## 2020-11-10 DIAGNOSIS — M6281 Muscle weakness (generalized): Secondary | ICD-10-CM | POA: Diagnosis present

## 2020-11-10 DIAGNOSIS — M25572 Pain in left ankle and joints of left foot: Secondary | ICD-10-CM | POA: Insufficient documentation

## 2020-11-10 DIAGNOSIS — R2689 Other abnormalities of gait and mobility: Secondary | ICD-10-CM | POA: Insufficient documentation

## 2020-11-10 DIAGNOSIS — R262 Difficulty in walking, not elsewhere classified: Secondary | ICD-10-CM | POA: Insufficient documentation

## 2020-11-10 DIAGNOSIS — R278 Other lack of coordination: Secondary | ICD-10-CM | POA: Diagnosis present

## 2020-11-10 MED ORDER — CARVEDILOL 25 MG PO TABS
ORAL_TABLET | Freq: Two times a day (BID) | ORAL | 1 refills | Status: DC
  Filled 2020-11-10: qty 180, 90d supply, fill #0
  Filled 2021-02-04 – 2021-02-05 (×2): qty 180, 90d supply, fill #1

## 2020-11-10 MED ORDER — ESTRADIOL 0.1 MG/GM VA CREA
TOPICAL_CREAM | VAGINAL | 5 refills | Status: DC
  Filled 2020-11-10: qty 42.5, 30d supply, fill #0
  Filled 2020-12-14: qty 42.5, 30d supply, fill #1
  Filled 2021-03-27: qty 42.5, 30d supply, fill #2
  Filled 2021-05-09: qty 42.5, 30d supply, fill #3
  Filled 2021-09-29: qty 42.5, 30d supply, fill #4

## 2020-11-10 MED FILL — Tramadol HCl Tab 50 MG: ORAL | 30 days supply | Qty: 240 | Fill #2 | Status: AC

## 2020-11-10 MED FILL — Alprazolam Tab 0.25 MG: ORAL | 30 days supply | Qty: 60 | Fill #2 | Status: AC

## 2020-11-10 NOTE — Therapy (Signed)
[facility] MAIN [facility] SERVICES [address] [place], [place], [address] Phone: [phone]   Fax:  [phone]  Physical Therapy Treatment  Patient Details  Name: Courtney Lopez MRN: [id number] Date of Birth: 04-18-1961 Referring Provider (PT): Dr. Dub Mikes   Encounter Date: 11/10/2020   PT End of Session - 11/10/20 0947     Visit Number 5    Number of Visits 17    Date for PT Re-Evaluation 12/06/20    Authorization Type Worker's comp, 1 eval plus 12 visits    Authorization - Visit Number 5    Authorization - Number of Visits 12    PT Start Time 0847    PT Stop Time 0928    PT Time Calculation (min) 41 min    Activity Tolerance Patient tolerated treatment well    Behavior During Therapy [facility] for tasks assessed/performed             Past Medical History:  Diagnosis Date   Ankle fracture 12/31/2018   left   Anxiety    Arthritis    neck, back, ankle, knees   Ascending aortic aneurysm (HCC)    a. 04/2016 CTA: 4.6cm Asc TAA; b. 04/2018 CTA: 4.8cm; c. 10/2018 CTA: 4.9x4.8cm; d. 12/2019 Echo: 4.8 cm.   Bicuspid aortic valve    Breast screening, unspecified    Carotid arterial disease (HCC)    a. 01/2020 U/S: 1-39% bilat ICA stenosis. No evidence of FMD.   Cervicalgia    cervical stenosis   Diabetes mellitus without complication (HCC)    Elevated coronary artery calcium score    a. 03/2016 Cardiac CT: Ca2+ = 78 (90th %'ile).   Fibromuscular dysplasia (HCC)    Heart murmur    History of hiatal hernia    Metatarsal fracture 2007   5th, from wearing high heels   Moderate aortic stenosis    a. 12/2019 Echo: EF 60-65%, no rwma, mild LVH, gr1 DD, nl RV size/fxn, triv MR, mild AI, mod AS, mod-sev dil of Asc Ao - 63mm.  AoV reported as tricuspid but prev known to be bicuspid.   Multiple thyroid nodules    a. 10/2019 U/S: R mid thyroid nodule - f/u 5 yrs.   Other symptoms involving abdomen and pelvis(789.9)    Renal artery  stenosis (HCC)    a. 12/2019 Renal duplex: Nl RRA. LRA >60% stenosis.   Renal cyst, right    a. 12/2019 Duplex: 5.8 x 5.7 cm R renal cyst in lower pole.   Retinal hemorrhage    Symptomatic menopausal or female climacteric states    Unspecified essential hypertension    Wears contact lenses     Past Surgical History:  Procedure Laterality Date   ABDOMINAL HYSTERECTOMY     ANKLE ARTHROSCOPY WITH RECONSTRUCTION Left 08/24/2020   Procedure: LEFT ANKLE ARTHROSCOPIC DEBRIDEMENT, EXTENSIVE, ARTHOSCROPIC TREATMENT OF TALUS OSTEOCHONDRAL LESION, LATERAL LIGAMENT RECONSTRUCTION;  Surgeon: Terance Hart, MD;  Location: [facility];  Service: Orthopedics;  Laterality: Left;   APPENDECTOMY     done during oophorectomy   CESAREAN SECTION     x 2   ETHMOIDECTOMY Left 03/02/2020   Procedure: ETHMOIDECTOMY;  Surgeon: Vernie Murders, MD;  Location: [facility];  Service: ENT;  Laterality: Left;   gyn surgery     hysterectomy- done for adenomysosis and endometriosis   IMAGE GUIDED SINUS SURGERY N/A 03/02/2020   Procedure: IMAGE GUIDED SINUS SURGERY;  Surgeon: Vernie Murders, MD;  Location: [facility];  Service: ENT;  Laterality: N/A;  need disk PLACED DISK ON OR CHARGE NURSE DESK 9-17 KP   IMAGE GUIDED SINUS SURGERY Left 04/13/2020   Procedure: IMAGE GUIDED SINUS SURGERY;  Surgeon: Vernie MurdersJuengel, Paul, MD;  [facility];  Service: ENT;  Laterality: Left;  needs stryker disk per Gloriajean DellNadine use same disk as before (October) put on charge nurses desk. DS 04/03/20   MAXILLARY ANTROSTOMY Left 03/02/2020   Procedure: MAXILLARY ANTROSTOMY;  Surgeon: Vernie MurdersJuengel, Paul, MD;  [facility];  Service: ENT;  Laterality: Left;   MAXILLARY ANTROSTOMY Left 04/13/2020   Procedure: MAXILLECTOMY;  Surgeon: Vernie MurdersJuengel, Paul, MD;  [facility];  Service: ENT;  Laterality: Left;   OOPHORECTOMY     unilateral, due to ruptured ovarian cysts   OSTEOTOMY  MAXILLARY     due to bite problems   TUBAL LIGATION      There were no vitals filed for this visit.   Subjective Assessment - 11/10/20 0851     Subjective Patient reports that she overdid it on vacation last week and has been experiencing constant pain in top of left foot. She returned to [place] on Wednesday with updated x-ray- patient reports healing well but leg is remaining swollen. She reports no changes in her restrictions at work.    Pertinent History Courtney Lopez is a 60 y.o. female s/p left ankle arthroscope for osteochondral lesions due to left ankle instability.  She initially sustained an injury back in August 2020 where she had a nondisplaced fracture of her fibula.  She healed this uneventfully but then had a reinjury approximately 1 year later with resultant instability despite conservative treatment.  X-rays and MRI scan revealed an osteochondral lesion on the medial talar dome.  Prior x-rays from several years ago demonstrated similar lesion but not as severe. Symptoms are rated as moderate to severe, and have been worsening.  This is significantly impairing activities of daily living.  She is s/p surgery on 08/24/20. Per Order, patient is WBAT in boot. Patient presents to therapy with knee walker. She reports while at work she is able to use the knee scooter for 20% of the time and then the rest of the time she is supposed to be sitting. She did start using a crutch over the weekend. reports some numbness/tingling down LLE into foot/ankle;    Limitations Lifting;Standing;Walking;House hold activities;Other (comment)   work restrictions including use of boot and knee scooter   Currently in Pain? Yes    Pain Score 3     Pain Location Foot    Pain Orientation Left    Pain Descriptors / Indicators Aching;Sore    Pain Type Chronic pain    Pain Onset More than a month ago    Pain Frequency Constant    Aggravating Factors  Prolonged standing/weightbearing, Increased swelling    Pain  Relieving Factors elevating/reducing swelling    Effect of Pain on Daily Activities Decreased ability to walk and work    Multiple Pain Sites No             Pre-treatment AROM:   Ankle Dorsiflex=  6 deg  Ankle Plantarflex = 12 deg  Ankle Eversion= 5 deg  Ankle Inversion= 8 deg    Manual therapy:  PT performed gentle PA/AP Talocrual joint mobilizations grade II x 30 sec bouts x 10 each.  PT performed passive LLE ankle DF,PF, IV, EV with overpressure 20 sec hold x5 reps each- *Patient reported  increased pain sensation with passive PF more than any other movement today.   PT performed passive calcaneal IV/EV x 4 min with overpressure to tolerance  PT performed passive L ankle inversion/eversion - x approx 20  reps each  Retrograde massage to left foot due to edema for improved pain relief and edema reduction x 8 min.   STM to Left anke/foot- achilles, peroneals, heel, plantar surface and anterior ankle. Attempted dorsum of foot however patient experiencing increased tenderness    Remeasured ankle DF/PF ROM at end of session:   Ankle DF= 9 deg Ankle PF= 20 deg  *Patient reported feeling "less tight" after session today.   Clinical Impression: Treatment was limited to Manual therapy techniques today due to increased swelling and more limited ROM than previous sessions. Patient did report improved pain and flexibility after session and presents with improved ROM after treatment. Will plan to progress back to more threrex next session if swelling has improved. Feel patient probably overdid it including [place] the beach. Emphasized need to concentrate on rest, ice, compression and elevation over th weekend to maximize reduction in swelling and improving ROM.  Patient would benefit from additional skilled PT intervention to improve strength and mobility                                      PT Education - 11/10/20 0946      Education Details Importance of elevation for swelling, rest for recovery, compression for swelling, and ice for pain/inflammation.    Person(s) Educated Patient    Methods Explanation;Verbal cues    Comprehension Verbalized understanding;Need further instruction              PT Short Term Goals - 10/11/20 1504       PT SHORT TERM GOAL #1   Title Patient will be adherent to HEP at least 3x a week to improve functional strength and balance for better safety at home.    Time 4    Period Weeks    Status New    Target Date 11/08/20      PT SHORT TERM GOAL #2   Title Patient will improve LLE ankle AROM: DF: 10 degrees, PF: 60 degrees, IV: 30 degrees, EV: 10 degrees to improve ankle ROM for ADLs including gait and transfers;    Time 4    Period Weeks    Status New    Target Date 11/08/20               PT Long Term Goals - 10/11/20 1505       PT LONG TERM GOAL #1   Title Patient will increase 10 meter walk test to >1.28m/s as to improve gait speed for better community ambulation and to reduce fall risk.    Time 8    Period Weeks    Status New    Target Date 12/06/20      PT LONG TERM GOAL #2   Title Patient will be independent with ascend/descend 8 steps using single UE in step over step pattern without LOB.    Time 8    Period Weeks    Status New    Target Date 12/06/20      PT LONG TERM GOAL #3   Title Patient will improve LLE ankle strength to grossly 4/5 to indicate improved strength for gait and walking tasks.    Time  8    Period Weeks    Status New    Target Date 12/06/20      PT LONG TERM GOAL #4   Title Patient will tolerate 5 seconds of single leg stance without loss of balance to improve ability to get in and out of shower safely.    Time 8    Period Weeks    Status New    Target Date 12/06/20      PT LONG TERM GOAL #5   Title Patient will improve FOTO score to >60% to indicate improved functional mobility with ADLs.    Time 8    Period Weeks     Status New    Target Date 12/06/20      Additional Long Term Goals   Additional Long Term Goals Yes      PT LONG TERM GOAL #6   Title Patient will report a worst pain of 3/10 on VAS in   left ankle          to improve tolerance with ADLs and reduced symptoms with activities.    Time 8    Period Weeks    Status New    Target Date 12/06/20                   Plan - 11/10/20 0948     Clinical Impression Statement Treatment was limited to Manual therapy techniques today due to increased swelling and more limited ROM than previous sessions. Patient did report improved pain and flexibility after session and presents with improved ROM after treatment. Will plan to progress back to more threrex next session if swelling has improved. Feel patient probably overdid it including negotiating steps at the beach. Emphasized need to concentrate on rest, ice, compression and elevation over th weekend to maximize reduction in swelling and improving ROM.  Patient would benefit from additional skilled PT intervention to improve strength and mobility    Personal Factors and Comorbidities Comorbidity 3+;Past/Current Experience;Time since onset of injury/illness/exacerbation    Comorbidities anxiety, arthritis, DMx2, fibromuscular dysplasia, HTN,    Examination-Activity Limitations Caring for Others;Carry;Locomotion Level;Squat;Stairs;Stand;Transfers    Examination-Participation Restrictions Cleaning;Community Activity;Occupation;Shop;Volunteer;Yard Work    Stability/Clinical Decision Making Stable/Uncomplicated    Rehab Potential Good    PT Frequency 2x / week    PT Duration 8 weeks    PT Treatment/Interventions Cryotherapy;Electrical Stimulation;Moist Heat;Ultrasound;DME Instruction;Gait training;Stair training;Functional mobility training;Therapeutic activities;Therapeutic exercise;Balance training;Neuromuscular re-education;Patient/family education;Manual techniques;Scar mobilization;Passive range of  motion;Dry needling;Energy conservation;Taping    PT Next Visit Plan advance HEP, continue with ROM/strengthening    PT Home Exercise Plan no changes to current plan this date    Consulted and Agree with Plan of Care Patient             Patient will benefit from skilled therapeutic intervention in order to improve the following deficits and impairments:  Abnormal gait, Decreased balance, Decreased endurance, Decreased mobility, Difficulty walking, Impaired sensation, Decreased range of motion, Decreased activity tolerance, Decreased strength, Increased fascial restricitons, Impaired flexibility, Pain  Visit Diagnosis: Abnormality of gait and mobility  Difficulty in walking, not elsewhere classified  Muscle weakness (generalized)     Problem List Patient Active Problem List   Diagnosis Date Noted   Ankle injury, sequela 06/07/2020   Personal history of COVID-19 05/28/2020   Diabetic nephropathy associated with type 2 diabetes mellitus (HCC) 10/21/2019   Renal artery stenosis (HCC) 10/27/2018   Hypertensive emergency    Thoracic aortic aneurysm without rupture (HCC)  Chest pain 03/10/2018   Paroxysmal tachycardia (HCC) 08/12/2017   Thyroid nodule 05/15/2017   Ascending aorta dilatation (HCC) 05/27/2016   Bicuspid aortic valve 05/27/2016   Encounter for preventive health examination 10/29/2015   Adjustment disorder with anxious mood 06/13/2014   Diabetes mellitus type 2 in obese (HCC) 03/25/2012   Aortic stenosis    Screening for colon cancer 08/25/2011   Screening for breast cancer 08/25/2011   Overweight 08/25/2011   Hyperlipidemia associated with type 2 diabetes mellitus (HCC) 08/25/2011   Cervical spine disease 06/18/2011   Hypertension 06/18/2011    Lenda Kelp, PT 11/10/2020, 9:59 AM  [place] [facility] MAIN [facility] SERVICES [address] [place], [place], [address] Phone: [phone]   Fax:  [phone]  Name: Courtney Lopez MRN: [id number] Date of Birth: 1960-08-21

## 2020-11-15 ENCOUNTER — Encounter: Payer: Self-pay | Admitting: Physical Therapy

## 2020-11-15 ENCOUNTER — Ambulatory Visit: Payer: PRIVATE HEALTH INSURANCE | Admitting: Physical Therapy

## 2020-11-15 ENCOUNTER — Other Ambulatory Visit: Payer: Self-pay

## 2020-11-15 DIAGNOSIS — R2689 Other abnormalities of gait and mobility: Secondary | ICD-10-CM

## 2020-11-15 DIAGNOSIS — R269 Unspecified abnormalities of gait and mobility: Secondary | ICD-10-CM | POA: Diagnosis not present

## 2020-11-15 DIAGNOSIS — M6281 Muscle weakness (generalized): Secondary | ICD-10-CM

## 2020-11-15 DIAGNOSIS — M25572 Pain in left ankle and joints of left foot: Secondary | ICD-10-CM

## 2020-11-15 DIAGNOSIS — R278 Other lack of coordination: Secondary | ICD-10-CM

## 2020-11-15 DIAGNOSIS — R262 Difficulty in walking, not elsewhere classified: Secondary | ICD-10-CM

## 2020-11-15 NOTE — Therapy (Signed)
[place] MAIN [facility] SERVICES [address] [place], [place], [address] Phone: [phone]   Fax:  [phone]  Physical Therapy Treatment  Patient Details  Name: Courtney Lopez MRN: [id number] Date of Birth: 11-11-1960 Referring Provider (PT): Dr. Melony Lopez   Encounter Date: 11/15/2020   PT End of Session - 11/15/20 1043     Visit Number 6    Number of Visits 17    Date for PT Re-Evaluation 12/06/20    Authorization Type Worker's comp, 1 eval plus 12 visits    Authorization - Visit Number 6    Authorization - Number of Visits 12    PT Start Time 0803    PT Stop Time 0845    PT Time Calculation (min) 42 min    Activity Tolerance Patient tolerated treatment well    Behavior During Therapy [facility] for tasks assessed/performed             Past Medical History:  Diagnosis Date   Ankle fracture 12/31/2018   left   Anxiety    Arthritis    neck, back, ankle, knees   Ascending aortic aneurysm ([place])    a. 04/2016 CTA: 4.6cm Asc TAA; b. 04/2018 CTA: 4.8cm; c. 10/2018 CTA: 4.9x4.8cm; d. 12/2019 Echo: 4.8 cm.   Bicuspid aortic valve    Breast screening, unspecified    Carotid arterial disease ([place])    a. 01/2020 U/S: 1-39% bilat ICA stenosis. No evidence of FMD.   Cervicalgia    cervical stenosis   Diabetes mellitus without complication (HCC)    Elevated coronary artery calcium score    a. 03/2016 Cardiac CT: Ca2+ = 78 (90th %'ile).   Fibromuscular dysplasia (HCC)    Heart murmur    History of hiatal hernia    Metatarsal fracture 2007   5th, from wearing high heels   Moderate aortic stenosis    a. 12/2019 Echo: EF 60-65%, no rwma, mild LVH, gr1 DD, nl RV size/fxn, triv MR, mild AI, mod AS, mod-sev dil of Asc Ao - 22m.  AoV reported as tricuspid but prev known to be bicuspid.   Multiple thyroid nodules    a. 10/2019 U/S: R mid thyroid nodule - f/u 5 yrs.   Other symptoms involving abdomen and pelvis(789.9)    Renal artery  stenosis ([place]    a. 12/2019 Renal duplex: Nl RRA. LRA >60% stenosis.   Renal cyst, right    a. 12/2019 Duplex: 5.8 x 5.7 cm R renal cyst in lower pole.   Retinal hemorrhage    Symptomatic menopausal or female climacteric states    Unspecified essential hypertension    Wears contact lenses     Past Surgical History:  Procedure Laterality Date   ABDOMINAL HYSTERECTOMY     ANKLE ARTHROSCOPY WITH RECONSTRUCTION Left 08/24/2020   Procedure: LEFT ANKLE ARTHROSCOPIC DEBRIDEMENT, EXTENSIVE, ARTHOSCROPIC TREATMENT OF TALUS OSTEOCHONDRAL LESION, LATERAL LIGAMENT RECONSTRUCTION;  Surgeon: Courtney Crocker MD;  [place]  Service: Orthopedics;  Laterality: Left;   APPENDECTOMY     done during oophorectomy   CESAREAN SECTION     x 2   ETHMOIDECTOMY Left 03/02/2020   Procedure: ETHMOIDECTOMY;  Surgeon: JMargaretha Sheffield MD;  [place]  Service: ENT;  Laterality: Left;   gyn surgery     hysterectomy- done for adenomysosis and endometriosis   IMAGE GUIDED SINUS SURGERY N/A 03/02/2020   Procedure: IMAGE GUIDED SINUS SURGERY;  Surgeon: JMargaretha Sheffield MD;  [facility]  Service: ENT;  Laterality: N/A;  need disk PLACED DISK ON OR CHARGE NURSE DESK 9-17 KP   IMAGE GUIDED SINUS SURGERY Left 04/13/2020   Procedure: IMAGE GUIDED SINUS SURGERY;  Surgeon: Courtney Sheffield, MD;  Location: [place];  Service: ENT;  Laterality: Left;  needs stryker disk per Courtney Lopez use same disk as before (October) put on charge nurses desk. DS 04/03/20   MAXILLARY ANTROSTOMY Left 03/02/2020   Procedure: MAXILLARY ANTROSTOMY;  Surgeon: Courtney Sheffield, MD;  Location: [place];  Service: ENT;  Laterality: Left;   MAXILLARY ANTROSTOMY Left 04/13/2020   Procedure: MAXILLECTOMY;  Surgeon: Courtney Sheffield, MD;  Location: [place];  Service: ENT;  Laterality: Left;   OOPHORECTOMY     unilateral, due to ruptured ovarian cysts   OSTEOTOMY  MAXILLARY     due to bite problems   TUBAL LIGATION      There were no vitals filed for this visit.   Subjective Assessment - 11/15/20 0809     Subjective Patient reports continued swelling in LLE foot/ankle which is worse throughout the day especially with increased activity;    Pertinent History Courtney Lopez is a 60 y.o. female s/p left ankle arthroscope for osteochondral lesions due to left ankle instability.  She initially sustained an injury back in August 2020 where she had a nondisplaced fracture of her fibula.  She healed this uneventfully but then had a reinjury approximately 1 year later with resultant instability despite conservative treatment.  X-rays and MRI scan revealed an osteochondral lesion on the medial talar dome.  Prior x-rays from several years ago demonstrated similar lesion but not as severe. Symptoms are rated as moderate to severe, and have been worsening.  This is significantly impairing activities of daily living.  She is s/p surgery on 08/24/20. Per Order, patient is WBAT in boot. Patient presents to therapy with knee walker. She reports while at work she is able to use the knee scooter for 20% of the time and then the rest of the time she is supposed to be sitting. She did start using a crutch over the weekend. reports some numbness/tingling down LLE into foot/ankle;    Limitations Lifting;Standing;Walking;House hold activities;Other (comment)   work restrictions including use of boot and knee scooter   Currently in Pain? Yes    Pain Score 1     Pain Location Ankle    Pain Orientation Left    Pain Descriptors / Indicators Aching;Sore;Tightness    Pain Type Chronic pain    Pain Onset More than a month ago    Pain Frequency Intermittent    Aggravating Factors  prolonged standing/weight bearing    Pain Relieving Factors elevated/reduce swelling    Effect of Pain on Daily Activities decreased activity tolerance;    Multiple Pain Sites No                   TREATMENT: LLE ankle AROM: DF 5 degrees, PF 50 degrees at start of session  LLE figure 8 circumferential: 50 cm Patient does exhibit increased swelling along top of left foot along distal metatarsals  PT performed grade II-III AP mobs to left talocrual joint 15 sec bouts with passive over pressure into ankle DF x5 sets; PT performed grade II-III AP/PA mobs at each metatarsal along met heads, 10 sec bouts x2 sets each with slight discomfort reported along 1st ray  PT performed gentle soft tissue massage distal to proximal of LLE foot to  help reduce swelling along top of left foot x5 min with good tolerance reported;   Exercise: Instructed patient in LE strengthening:  Long sitting: Green tband DF, IV, EV, PF x15 reps each;   Isometric great toe extension 3 sec hold x10 reps with good tolerance reported  Instructed patient in contract/relax to facilitate better ankle DF ROM: Contract/relax agonist with resisted DF 5 sec hold and relax with overpressure into DF x5 sec x3 sets Contract/relax antagonist with resisted PF 5 sec hold, and relax with overpressure into DF x5 sec, x5 sets;  Following exercise, LLE ankle ROM: DF: 10 degrees, PF 60 degrees  PT educated patient in ice massage x3 min with instruction to do small circles and rub distal to proximal to help reduce swelling and reduce pain;  Patient tolerated well. She reports less swelling and less stiffness at end of session; Reinforced HEP;                   PT Education - 11/15/20 1043     Education Details ROM/ice massage    Person(s) Educated Patient    Methods Explanation;Verbal cues    Comprehension Verbalized understanding;Returned demonstration;Verbal cues required;Need further instruction              PT Short Term Goals - 10/11/20 1504       PT SHORT TERM GOAL #1   Title Patient will be adherent to HEP at least 3x a week to improve functional strength and balance for better safety  at home.    Time 4    Period Weeks    Status New    Target Date 11/08/20      PT SHORT TERM GOAL #2   Title Patient will improve LLE ankle AROM: DF: 10 degrees, PF: 60 degrees, IV: 30 degrees, EV: 10 degrees to improve ankle ROM for ADLs including gait and transfers;    Time 4    Period Weeks    Status New    Target Date 11/08/20               PT Long Term Goals - 10/11/20 1505       PT LONG TERM GOAL #1   Title Patient will increase 10 meter walk test to >1.52ms as to improve gait speed for better community ambulation and to reduce fall risk.    Time 8    Period Weeks    Status New    Target Date 12/06/20      PT LONG TERM GOAL #2   Title Patient will be independent with ascend/descend 8 steps using single UE in step over step pattern without LOB.    Time 8    Period Weeks    Status New    Target Date 12/06/20      PT LONG TERM GOAL #3   Title Patient will improve LLE ankle strength to grossly 4/5 to indicate improved strength for gait and walking tasks.    Time 8    Period Weeks    Status New    Target Date 12/06/20      PT LONG TERM GOAL #4   Title Patient will tolerate 5 seconds of single leg stance without loss of balance to improve ability to get in and out of shower safely.    Time 8    Period Weeks    Status New    Target Date 12/06/20      PT LONG TERM GOAL #5   Title  Patient will improve FOTO score to >60% to indicate improved functional mobility with ADLs.    Time 8    Period Weeks    Status New    Target Date 12/06/20      Additional Long Term Goals   Additional Long Term Goals Yes      PT LONG TERM GOAL #6   Title Patient will report a worst pain of 3/10 on VAS in   left ankle          to improve tolerance with ADLs and reduced symptoms with activities.    Time 8    Period Weeks    Status New    Target Date 12/06/20                   Plan - 11/15/20 1043     Clinical Impression Statement Patient motivated and participated  well within session. PT performed manual therapy to facilitate increased ROM and reduce swelling. She tolerated well with improvement in ROM noted at end of session. Educated patient in ice massage to help reduce swelling and improve tissue extensibility. patient verbalized understanding. PT instructed patient in LLE ankle strengthening/ROM exercise. recommend patient work on distal foot/toe movement to help reduce swelling on top of foot. She tolerated exercise well without increase in pain. She would benefit from additional skilled PT intervention to improve ankle ROM, reduce swelling and improve ADL mobility with improved foot/ankle strength;    Personal Factors and Comorbidities Comorbidity 3+;Past/Current Experience;Time since onset of injury/illness/exacerbation    Comorbidities anxiety, arthritis, DMx2, fibromuscular dysplasia, HTN,    Examination-Activity Limitations Caring for Others;Carry;Locomotion Level;Squat;Stairs;Stand;Transfers    Examination-Participation Restrictions Cleaning;Community Activity;Occupation;Shop;Volunteer;Yard Work    Stability/Clinical Decision Making Stable/Uncomplicated    Rehab Potential Good    PT Frequency 2x / week    PT Duration 8 weeks    PT Treatment/Interventions Cryotherapy;Electrical Stimulation;Moist Heat;Ultrasound;DME Instruction;Gait training;Stair training;Functional mobility training;Therapeutic activities;Therapeutic exercise;Balance training;Neuromuscular re-education;Patient/family education;Manual techniques;Scar mobilization;Passive range of motion;Dry needling;Energy conservation;Taping    PT Next Visit Plan advance HEP, continue with ROM/strengthening    PT Home Exercise Plan no changes to current plan this date    Consulted and Agree with Plan of Care Patient             Patient will benefit from skilled therapeutic intervention in order to improve the following deficits and impairments:  Abnormal gait, Decreased balance, Decreased  endurance, Decreased mobility, Difficulty walking, Impaired sensation, Decreased range of motion, Decreased activity tolerance, Decreased strength, Increased fascial restricitons, Impaired flexibility, Pain  Visit Diagnosis: Abnormality of gait and mobility  Difficulty in walking, not elsewhere classified  Muscle weakness (generalized)  Other lack of coordination  Other abnormalities of gait and mobility  Pain in left ankle and joints of left foot     Problem List Patient Active Problem List   Diagnosis Date Noted   Ankle injury, sequela 06/07/2020   Personal history of COVID-19 05/28/2020   Diabetic nephropathy associated with type 2 diabetes mellitus ([place]) 10/21/2019   Renal artery stenosis ([place]) 10/27/2018   Hypertensive emergency    Thoracic aortic aneurysm without rupture ([place])    Chest pain 03/10/2018   Paroxysmal tachycardia (HCC) 08/12/2017   Thyroid nodule 05/15/2017   Ascending aorta dilatation ([place]) 05/27/2016   Bicuspid aortic valve 05/27/2016   Encounter for preventive health examination 10/29/2015   Adjustment disorder with anxious mood 06/13/2014   Diabetes mellitus type 2 in obese ([place]) 03/25/2012   Aortic stenosis  Screening for colon cancer 08/25/2011   Screening for breast cancer 08/25/2011   Overweight 08/25/2011   Hyperlipidemia associated with type 2 diabetes mellitus ([place]) 08/25/2011   Cervical spine disease 06/18/2011   Hypertension 06/18/2011    Jalynn Betzold PT, DPT 11/15/2020, 10:49 AM  [place], [place], [address] Phone: [phone]   Fax:  [phone]  Name: Courtney Lopez MRN: [id number] Date of Birth: [birth year]

## 2020-11-17 ENCOUNTER — Other Ambulatory Visit: Payer: Self-pay

## 2020-11-17 ENCOUNTER — Ambulatory Visit: Payer: PRIVATE HEALTH INSURANCE

## 2020-11-17 DIAGNOSIS — R2689 Other abnormalities of gait and mobility: Secondary | ICD-10-CM

## 2020-11-17 DIAGNOSIS — R269 Unspecified abnormalities of gait and mobility: Secondary | ICD-10-CM | POA: Diagnosis not present

## 2020-11-17 DIAGNOSIS — M25572 Pain in left ankle and joints of left foot: Secondary | ICD-10-CM

## 2020-11-17 DIAGNOSIS — R278 Other lack of coordination: Secondary | ICD-10-CM

## 2020-11-17 DIAGNOSIS — M6281 Muscle weakness (generalized): Secondary | ICD-10-CM

## 2020-11-17 NOTE — Therapy (Signed)
[facility] MAIN [facility] SERVICES [address] [place], [place], [address] Phone: [phone]   Fax:  [phone]  Physical Therapy Treatment  Patient Details  Name: Courtney Lopez MRN: [id number] Date of Birth: 1960-08-11 Referring Provider (PT): Dr. Dub Mikes   Encounter Date: 11/17/2020   PT End of Session - 11/17/20 0947     Visit Number 7    Number of Visits 17    Date for PT Re-Evaluation 12/06/20    Authorization Type Worker's comp, 1 eval plus 12 visits    Authorization - Visit Number 6    Authorization - Number of Visits 12    PT Start Time [id number]    PT Stop Time 0930    PT Time Calculation (min) 44 min    Activity Tolerance Patient tolerated treatment well    Behavior During Therapy [facility] for tasks assessed/performed             Past Medical History:  Diagnosis Date   Ankle fracture 12/31/2018   left   Anxiety    Arthritis    neck, back, ankle, knees   Ascending aortic aneurysm (HCC)    a. 04/2016 CTA: 4.6cm Asc TAA; b. 04/2018 CTA: 4.8cm; c. 10/2018 CTA: 4.9x4.8cm; d. 12/2019 Echo: 4.8 cm.   Bicuspid aortic valve    Breast screening, unspecified    Carotid arterial disease (HCC)    a. 01/2020 U/S: 1-39% bilat ICA stenosis. No evidence of FMD.   Cervicalgia    cervical stenosis   Diabetes mellitus without complication (HCC)    Elevated coronary artery calcium score    a. 03/2016 Cardiac CT: Ca2+ = 78 (90th %'ile).   Fibromuscular dysplasia (HCC)    Heart murmur    History of hiatal hernia    Metatarsal fracture 2007   5th, from wearing high heels   Moderate aortic stenosis    a. 12/2019 Echo: EF 60-65%, no rwma, mild LVH, gr1 DD, nl RV size/fxn, triv MR, mild AI, mod AS, mod-sev dil of Asc Ao - 80mm.  AoV reported as tricuspid but prev known to be bicuspid.   Multiple thyroid nodules    a. 10/2019 U/S: R mid thyroid nodule - f/u 5 yrs.   Other symptoms involving abdomen and pelvis(789.9)    Renal artery  stenosis (HCC)    a. 12/2019 Renal duplex: Nl RRA. LRA >60% stenosis.   Renal cyst, right    a. 12/2019 Duplex: 5.8 x 5.7 cm R renal cyst in lower pole.   Retinal hemorrhage    Symptomatic menopausal or female climacteric states    Unspecified essential hypertension    Wears contact lenses     Past Surgical History:  Procedure Laterality Date   ABDOMINAL HYSTERECTOMY     ANKLE ARTHROSCOPY WITH RECONSTRUCTION Left 08/24/2020   Procedure: LEFT ANKLE ARTHROSCOPIC DEBRIDEMENT, EXTENSIVE, ARTHOSCROPIC TREATMENT OF TALUS OSTEOCHONDRAL LESION, LATERAL LIGAMENT RECONSTRUCTION;  Surgeon: Terance Hart, MD;  Location: [facility];  Service: Orthopedics;  Laterality: Left;   APPENDECTOMY     done during oophorectomy   CESAREAN SECTION     x 2   ETHMOIDECTOMY Left 03/02/2020   Procedure: ETHMOIDECTOMY;  Surgeon: Vernie Murders, MD;  Location: [facility];  Service: ENT;  Laterality: Left;   gyn surgery     hysterectomy- done for adenomysosis and endometriosis   IMAGE GUIDED SINUS SURGERY N/A 03/02/2020   Procedure: IMAGE GUIDED SINUS SURGERY;  Surgeon: Vernie Murders, MD;  Location: [facility];  Service: ENT;  Laterality: N/A;  need disk PLACED DISK ON OR CHARGE NURSE DESK 9-17 KP   IMAGE GUIDED SINUS SURGERY Left 04/13/2020   Procedure: IMAGE GUIDED SINUS SURGERY;  Surgeon: Vernie Murders, MD;  Location: [facility];  Service: ENT;  Laterality: Left;  needs [employer]ryker disk per Gloriajean Dell use same disk as before (October) put on charge nurses desk. DS 04/03/20   MAXILLARY ANTROSTOMY Left 03/02/2020   Procedure: MAXILLARY ANTROSTOMY;  Surgeon: Vernie Murders, MD;  Location: [facility];  Service: ENT;  Laterality: Left;   MAXILLARY ANTROSTOMY Left 04/13/2020   Procedure: MAXILLECTOMY;  Surgeon: Vernie Murders, MD;  Location: [facility];  Service: ENT;  Laterality: Left;   OOPHORECTOMY     unilateral, due to ruptured ovarian cysts   OSTEOTOMY  MAXILLARY     due to bite problems   TUBAL LIGATION      There were no vitals filed for this visit.   Subjective Assessment - 11/17/20 0846     Subjective Pt reports ice massage has helped her LLE. Pt reports pain is currently a 3/10.    Pertinent History Courtney Lopez is a 60 y.o. female s/p left ankle arthroscope for osteochondral lesions due to left ankle instability.  She initially sustained an injury back in August 2020 where she had a nondisplaced fracture of her fibula.  She healed this uneventfully but then had a reinjury approximately 1 year later with resultant instability despite conservative treatment.  X-rays and MRI scan revealed an osteochondral lesion on the medial talar dome.  Prior x-rays from several years ago demonstrated similar lesion but not as severe. Symptoms are rated as moderate to severe, and have been worsening.  This is significantly impairing activities of daily living.  She is s/p surgery on 08/24/20. Per Order, patient is WBAT in boot. Patient presents to therapy with knee walker. She reports while at work she is able to use the knee scooter for 20% of the time and then the rest of the time she is supposed to be sitting. She did start using a crutch over the weekend. reports some numbness/tingling down LLE into foot/ankle;    Limitations Lifting;Standing;Walking;House hold activities;Other (comment)   work restrictions including use of boot and knee scooter   Currently in Pain? Yes    Pain Score 3     Pain Location Ankle    Pain Orientation Left    Pain Onset More than a month ago            TREATMENT:  Initial LLE ankle AROM (prior to interventions): DF 5 degrees, PF 25 degrees at start of session  Supine/long sitting on plinth: Ankle pumps 20x; cuing for decreased ROM d/t slight increase in anterior ankle pain that resolves with reps.  Alphabet 1x through; slight increase in pain, pt states, "it's not bad."    PT performed grade II-III AP mobs  to left talocrual joint  20-30 sec bouts with passive over pressure into ankle DF - x multiple reps.  PT performed grade II-III AP/PA mobs with placing pt in increased L knee flexion with overpressure for ankle DF, x multiple 20 sec bouts. No discomfort with exercise.    PT performed gentle soft tissue massage distal to proximal of LLE foot/ankle to help reduce swelling along top of left foot x5 min with improvement in sx reported.   Manually resisted contract/relax with overpressure for L ankle DF/PF 10x with 5 sec holds  Isometrics  with soft red ball PF/DF/IV/ER of L ankle 2 sets of each for 5 reps with 5 sec holds  PT provides LLE PF/DF/IV/EV stretches 60-120 sec for each. Pt reports feels good.   L ankle anterior tib and gastroc sol stretches (assisted) x2 min each  L big toe and 2nd-5th digit stretch into extension/flexion 60 sec each and AAROM 10x   Manually resisted big toe ext. 2x60 sec.   After interventions: L ankle DF 12 deg PF 40 deg  Pt seated  L foot towel scrunches 2 min  Rocker board LLE ankle DF/PF 20x. Pt reports feels good    PT reinforced use of  ice massage x3 min following therex to help reduce swelling and reduce pain;    PT Education - 11/17/20 0949     Education Details exercise technique, body mechanics    Person(s) Educated Patient    Methods Explanation;Demonstration;Tactile cues;Verbal cues    Comprehension Verbalized understanding;Returned demonstration              PT Short Term Goals - 10/11/20 1504       PT SHORT TERM GOAL #1   Title Patient will be adherent to HEP at least 3x a week to improve functional strength and balance for better safety at home.    Time 4    Period Weeks    Status New    Target Date 11/08/20      PT SHORT TERM GOAL #2   Title Patient will improve LLE ankle AROM: DF: 10 degrees, PF: 60 degrees, IV: 30 degrees, EV: 10 degrees to improve ankle ROM for ADLs including gait and transfers;    Time 4    Period  Weeks    Status New    Target Date 11/08/20               PT Long Term Goals - 10/11/20 1505       PT LONG TERM GOAL #1   Title Patient will increase 10 meter walk test to >1.87m/s as to improve gait speed for better community ambulation and to reduce fall risk.    Time 8    Period Weeks    Status New    Target Date 12/06/20      PT LONG TERM GOAL #2   Title Patient will be independent with ascend/descend 8 steps using single UE in step over step pattern without LOB.    Time 8    Period Weeks    Status New    Target Date 12/06/20      PT LONG TERM GOAL #3   Title Patient will improve LLE ankle strength to grossly 4/5 to indicate improved strength for gait and walking tasks.    Time 8    Period Weeks    Status New    Target Date 12/06/20      PT LONG TERM GOAL #4   Title Patient will tolerate 5 seconds of single leg stance without loss of balance to improve ability to get in and out of shower safely.    Time 8    Period Weeks    Status New    Target Date 12/06/20      PT LONG TERM GOAL #5   Title Patient will improve FOTO score to >60% to indicate improved functional mobility with ADLs.    Time 8    Period Weeks    Status New    Target Date 12/06/20      Additional Long  Term Goals   Additional Long Term Goals Yes      PT LONG TERM GOAL #6   Title Patient will report a worst pain of 3/10 on VAS in   left ankle          to improve tolerance with ADLs and reduced symptoms with activities.    Time 8    Period Weeks    Status New    Target Date 12/06/20                   Plan - 11/17/20 0935     Clinical Impression Statement Author continues POC as laid out in previous sessoins. Pt continues to be highly motivated to participate in PT. She tolerates majority of therex well, indicating only minor increases in LLE discomfort with ankle pumps and PF stretch that resolved following isometric exercises to L ankle. Pt reports sx improvement at end of session  and exhibits improved ankle ROM (initial DF/PF 5/25 deg, at end of session 12/40 deg). The pt will benefit from further skilled PT to continue to improve ankle ROM, strength, and to reduce swelling and pain in order to improve mobility and ease with ADLs.    Personal Factors and Comorbidities Comorbidity 3+;Past/Current Experience;Time since onset of injury/illness/exacerbation    Comorbidities anxiety, arthritis, DMx2, fibromuscular dysplasia, HTN,    Examination-Activity Limitations Caring for Others;Carry;Locomotion Level;Squat;Stairs;Stand;Transfers    Examination-Participation Restrictions Cleaning;Community Activity;Occupation;Shop;Volunteer;Yard Work    Stability/Clinical Decision Making Stable/Uncomplicated    Rehab Potential Good    PT Frequency 2x / week    PT Duration 8 weeks    PT Treatment/Interventions Cryotherapy;Electrical Stimulation;Moist Heat;Ultrasound;DME Instruction;Gait training;Stair training;Functional mobility training;Therapeutic activities;Therapeutic exercise;Balance training;Neuromuscular re-education;Patient/family education;Manual techniques;Scar mobilization;Passive range of motion;Dry needling;Energy conservation;Taping    PT Next Visit Plan advance HEP, continue with ROM/strengthening, continue POC as previously indicated    PT Home Exercise Plan no changes to current plan this date    Consulted and Agree with Plan of Care Patient             Patient will benefit from skilled therapeutic intervention in order to improve the following deficits and impairments:  Abnormal gait, Decreased balance, Decreased endurance, Decreased mobility, Difficulty walking, Impaired sensation, Decreased range of motion, Decreased activity tolerance, Decreased strength, Increased fascial restricitons, Impaired flexibility, Pain  Visit Diagnosis: Pain in left ankle and joints of left foot  Other abnormalities of gait and mobility  Muscle weakness (generalized)  Other lack of  coordination     Problem List Patient Active Problem List   Diagnosis Date Noted   Ankle injury, sequela 06/07/2020   Personal history of COVID-19 05/28/2020   Diabetic nephropathy associated with type 2 diabetes mellitus (HCC) 10/21/2019   Renal artery stenosis (HCC) 10/27/2018   Hypertensive emergency    Thoracic aortic aneurysm without rupture (HCC)    Chest pain 03/10/2018   Paroxysmal tachycardia (HCC) 08/12/2017   Thyroid nodule 05/15/2017   Ascending aorta dilatation (HCC) 05/27/2016   Bicuspid aortic valve 05/27/2016   Encounter for preventive health examination 10/29/2015   Adjustment disorder with anxious mood 06/13/2014   Diabetes mellitus type 2 in obese (HCC) 03/25/2012   Aortic stenosis    Screening for colon cancer 08/25/2011   Screening for breast cancer 08/25/2011   Overweight 08/25/2011   Hyperlipidemia associated with type 2 diabetes mellitus (HCC) 08/25/2011   Cervical spine disease 06/18/2011   Hypertension 06/18/2011   Courtney Lopez PT, DPT  11/17/2020, 9:49 AM  [facility] MAIN [facility] SERVICES [address] [place], [place], [address] Phone: [phone]   Fax:  [phone]  Name: Courtney Lopez MRN: [id number] Date of Birth: 1961-02-19

## 2020-11-20 ENCOUNTER — Ambulatory Visit: Payer: PRIVATE HEALTH INSURANCE | Admitting: Physical Therapy

## 2020-11-22 ENCOUNTER — Encounter: Payer: Self-pay | Admitting: Physical Therapy

## 2020-11-22 ENCOUNTER — Ambulatory Visit: Payer: PRIVATE HEALTH INSURANCE | Admitting: Physical Therapy

## 2020-11-22 ENCOUNTER — Other Ambulatory Visit: Payer: Self-pay

## 2020-11-22 ENCOUNTER — Other Ambulatory Visit: Payer: Self-pay | Admitting: Internal Medicine

## 2020-11-22 DIAGNOSIS — M25572 Pain in left ankle and joints of left foot: Secondary | ICD-10-CM

## 2020-11-22 DIAGNOSIS — M6281 Muscle weakness (generalized): Secondary | ICD-10-CM

## 2020-11-22 DIAGNOSIS — R2689 Other abnormalities of gait and mobility: Secondary | ICD-10-CM

## 2020-11-22 DIAGNOSIS — R269 Unspecified abnormalities of gait and mobility: Secondary | ICD-10-CM | POA: Diagnosis not present

## 2020-11-22 DIAGNOSIS — R278 Other lack of coordination: Secondary | ICD-10-CM

## 2020-11-22 DIAGNOSIS — R262 Difficulty in walking, not elsewhere classified: Secondary | ICD-10-CM

## 2020-11-22 MED ORDER — METFORMIN HCL ER 750 MG PO TB24
ORAL_TABLET | Freq: Every day | ORAL | 0 refills | Status: DC
  Filled 2020-11-22: qty 90, 90d supply, fill #0

## 2020-11-22 NOTE — Therapy (Signed)
[place] MAIN [facility] SERVICES [address] [place], [place], [address] Phone: [phone]   Fax:  [phone]  Physical Therapy Treatment  Patient Details  Name: Courtney Lopez MRN: [id number] Date of Birth: [birth year] Referring Provider (PT): Dr. Melony Overly   Encounter Date: 11/22/2020   PT End of Session - 11/22/20 1128     Visit Number 8    Number of Visits 17    Date for PT Re-Evaluation 12/06/20    Authorization Type Worker's comp, 1 eval plus 12 visits    Authorization - Visit Number 7    Authorization - Number of Visits 12    PT Start Time 0805    PT Stop Time 0846    PT Time Calculation (min) 41 min    Activity Tolerance Patient tolerated treatment well    Behavior During Therapy [facility] for tasks assessed/performed             Past Medical History:  Diagnosis Date   Ankle fracture 12/31/2018   left   Anxiety    Arthritis    neck, back, ankle, knees   Ascending aortic aneurysm ([place])    a. 04/2016 CTA: 4.6cm Asc TAA; b. 04/2018 CTA: 4.8cm; c. 10/2018 CTA: 4.9x4.8cm; d. 12/2019 Echo: 4.8 cm.   Bicuspid aortic valve    Breast screening, unspecified    Carotid arterial disease ([place])    a. 01/2020 U/S: 1-39% bilat ICA stenosis. No evidence of FMD.   Cervicalgia    cervical stenosis   Diabetes mellitus without complication (HCC)    Elevated coronary artery calcium score    a. 03/2016 Cardiac CT: Ca2+ = 78 (90th %'ile).   Fibromuscular dysplasia (HCC)    Heart murmur    History of hiatal hernia    Metatarsal fracture 2007   5th, from wearing high heels   Moderate aortic stenosis    a. 12/2019 Echo: EF 60-65%, no rwma, mild LVH, gr1 DD, nl RV size/fxn, triv MR, mild AI, mod AS, mod-sev dil of Asc Ao - 18mm.  AoV reported as tricuspid but prev known to be bicuspid.   Multiple thyroid nodules    a. 10/2019 U/S: R mid thyroid nodule - f/u 5 yrs.   Other symptoms involving abdomen and pelvis(789.9)    Renal artery  stenosis ([place])    a. 12/2019 Renal duplex: Nl RRA. LRA >60% stenosis.   Renal cyst, right    a. 12/2019 Duplex: 5.8 x 5.7 cm R renal cyst in lower pole.   Retinal hemorrhage    Symptomatic menopausal or female climacteric states    Unspecified essential hypertension    Wears contact lenses     Past Surgical History:  Procedure Laterality Date   ABDOMINAL HYSTERECTOMY     ANKLE ARTHROSCOPY WITH RECONSTRUCTION Left 08/24/2020   Procedure: LEFT ANKLE ARTHROSCOPIC DEBRIDEMENT, EXTENSIVE, ARTHOSCROPIC TREATMENT OF TALUS OSTEOCHONDRAL LESION, LATERAL LIGAMENT RECONSTRUCTION;  Surgeon: Erle Crocker, MD;  Location: [place];  Service: Orthopedics;  Laterality: Left;   APPENDECTOMY     done during oophorectomy   CESAREAN SECTION     x 2   ETHMOIDECTOMY Left 03/02/2020   Procedure: ETHMOIDECTOMY;  Surgeon: Margaretha Sheffield, MD;  Location: [place];  Service: ENT;  Laterality: Left;   gyn surgery     hysterectomy- done for adenomysosis and endometriosis   IMAGE GUIDED SINUS SURGERY N/A 03/02/2020   Procedure: IMAGE GUIDED SINUS SURGERY;  Surgeon: Margaretha Sheffield, MD;  [facility];  Service: ENT;  Laterality: N/A;  need disk PLACED DISK ON OR CHARGE NURSE DESK 9-17 KP   IMAGE GUIDED SINUS SURGERY Left 04/13/2020   Procedure: IMAGE GUIDED SINUS SURGERY;  Surgeon: Margaretha Sheffield, MD;  Location: [place];  Service: ENT;  Laterality: Left;  needs stryker disk per Justice Rocher use same disk as before (October) put on charge nurses desk. DS 04/03/20   MAXILLARY ANTROSTOMY Left 03/02/2020   Procedure: MAXILLARY ANTROSTOMY;  Surgeon: Margaretha Sheffield, MD;  Location: [place];  Service: ENT;  Laterality: Left;   MAXILLARY ANTROSTOMY Left 04/13/2020   Procedure: MAXILLECTOMY;  Surgeon: Margaretha Sheffield, MD;  Location: [place];  Service: ENT;  Laterality: Left;   OOPHORECTOMY     unilateral, due to ruptured ovarian cysts   OSTEOTOMY  MAXILLARY     due to bite problems   TUBAL LIGATION      There were no vitals filed for this visit.   Subjective Assessment - 11/22/20 1127     Subjective Pt reports ice massage has helped her LLE. She reports minimal swelling as she hasn't been working last several days. reports she has been trying to walk more without the walker and without the boot.    Pertinent History Courtney Lopez is a 60 y.o. female s/p left ankle arthroscope for osteochondral lesions due to left ankle instability.  She initially sustained an injury back in August 2020 where she had a nondisplaced fracture of her fibula.  She healed this uneventfully but then had a reinjury approximately 1 year later with resultant instability despite conservative treatment.  X-rays and MRI scan revealed an osteochondral lesion on the medial talar dome.  Prior x-rays from several years ago demonstrated similar lesion but not as severe. Symptoms are rated as moderate to severe, and have been worsening.  This is significantly impairing activities of daily living.  She is s/p surgery on 08/24/20. Per Order, patient is WBAT in boot. Patient presents to therapy with knee walker. She reports while at work she is able to use the knee scooter for 20% of the time and then the rest of the time she is supposed to be sitting. She did start using a crutch over the weekend. reports some numbness/tingling down LLE into foot/ankle;    Limitations Lifting;Standing;Walking;House hold activities;Other (comment)   work restrictions including use of boot and knee scooter   Currently in Pain? Yes    Pain Location Ankle    Pain Orientation Left    Pain Descriptors / Indicators Aching;Sore;Tightness    Pain Type Chronic pain    Pain Onset More than a month ago    Pain Frequency Intermittent    Aggravating Factors  prolonged standing/weight bearing    Pain Relieving Factors elevated/reduce swelling    Effect of Pain on Daily Activities decreased activity  tolerance;    Multiple Pain Sites No                      TREATMENT: LLE ankle AROM: DF 5 degrees, PF 55 degrees at start of session    Manual Therapy: PT performed grade II-III AP mobs to left talocrual joint 15 sec bouts with passive over pressure into ankle DF x5 sets; PT performed grade II-III AP/PA mobs at each metatarsal along met heads, 10 sec bouts x2 sets each with slight discomfort reported along 1st ray  Progressed joint mobs to standing LLE ankle DF on step with overpressure with talocrual  AP mobilization 10 sec bouts x3 sets; patient reports mild discomfort along anterior ankle   Exercise: Instructed patient in LE strengthening:   Long sitting: Green tband DF, IV, EV, PF x15 reps each;    Isometric great toe extension 3 sec hold x10 reps with good tolerance reported   Standing: BLE heel raises partial ROM with cues to improve weight shift to left side for better ankle muscle activation x10 reps; Standing heel off step calf stretch 20 sec hold x2 reps LLE  Seated  LLE ankle DF/PF foot rocker stretch 5 sec hold x5 reps x2 sets  Standing: LLE ankle IV/EV BAPs board level 3 x15 reps each direction with min VCs for proper positioning and to slow down for better motor control;    Following exercise, LLE ankle ROM: DF: 10 degrees, PF 65 degrees   PT performed ice massage x5 min doing small circles and rub distal to proximal to help reduce swelling and reduce pain; At end of session, PT identified increased stiffness along incision sites with possible scar adherence noted. She would benefit from additional scar tissue massage to help improve tissue extensibility;  Patient tolerated well. She reports less swelling and less stiffness at end of session; Reinforced HEP;                        PT Education - 11/22/20 1128     Education Details exercise technique/positioning;    Person(s) Educated Patient    Methods Explanation;Verbal cues     Comprehension Verbalized understanding;Returned demonstration;Verbal cues required;Need further instruction              PT Short Term Goals - 10/11/20 1504       PT SHORT TERM GOAL #1   Title Patient will be adherent to HEP at least 3x a week to improve functional strength and balance for better safety at home.    Time 4    Period Weeks    Status New    Target Date 11/08/20      PT SHORT TERM GOAL #2   Title Patient will improve LLE ankle AROM: DF: 10 degrees, PF: 60 degrees, IV: 30 degrees, EV: 10 degrees to improve ankle ROM for ADLs including gait and transfers;    Time 4    Period Weeks    Status New    Target Date 11/08/20               PT Long Term Goals - 10/11/20 1505       PT LONG TERM GOAL #1   Title Patient will increase 10 meter walk test to >1.29ms as to improve gait speed for better community ambulation and to reduce fall risk.    Time 8    Period Weeks    Status New    Target Date 12/06/20      PT LONG TERM GOAL #2   Title Patient will be independent with ascend/descend 8 steps using single UE in step over step pattern without LOB.    Time 8    Period Weeks    Status New    Target Date 12/06/20      PT LONG TERM GOAL #3   Title Patient will improve LLE ankle strength to grossly 4/5 to indicate improved strength for gait and walking tasks.    Time 8    Period Weeks    Status New    Target Date 12/06/20  PT LONG TERM GOAL #4   Title Patient will tolerate 5 seconds of single leg stance without loss of balance to improve ability to get in and out of shower safely.    Time 8    Period Weeks    Status New    Target Date 12/06/20      PT LONG TERM GOAL #5   Title Patient will improve FOTO score to >60% to indicate improved functional mobility with ADLs.    Time 8    Period Weeks    Status New    Target Date 12/06/20      Additional Long Term Goals   Additional Long Term Goals Yes      PT LONG TERM GOAL #6   Title Patient will  report a worst pain of 3/10 on VAS in   left ankle          to improve tolerance with ADLs and reduced symptoms with activities.    Time 8    Period Weeks    Status New    Target Date 12/06/20                   Plan - 11/22/20 1129     Clinical Impression Statement Patient motivated and participated well within session. She is progressing well with improved LLE ankle ROM at start and end of session. Patient tolerated manual therapy well reporting less stiffness in left ankle joint at end of session. PT progressed joint mobilizations to standing with step stretch to facilitate better ROM. Patient reports mild discomfort but denies any significant pain. Patient instructed in advanced LE strengthening exercise to facilitate better AROM as well as improved ankle control. She reports mild fatigue at end of session. Patient does require min VCs for proper positioning and exercise technique. She did exhibit increased scar tissue adherence along incision sites. Consider additional soft tissue massage next session to improve tissue extensibility. patient would benefit from additional skilled PT Intervention to improve ROM and reduce pain with ADLs.    Personal Factors and Comorbidities Comorbidity 3+;Past/Current Experience;Time since onset of injury/illness/exacerbation    Comorbidities anxiety, arthritis, DMx2, fibromuscular dysplasia, HTN,    Examination-Activity Limitations Caring for Others;Carry;Locomotion Level;Squat;Stairs;Stand;Transfers    Examination-Participation Restrictions Cleaning;Community Activity;Occupation;Shop;Volunteer;Yard Work    Stability/Clinical Decision Making Stable/Uncomplicated    Rehab Potential Good    PT Frequency 2x / week    PT Duration 8 weeks    PT Treatment/Interventions Cryotherapy;Electrical Stimulation;Moist Heat;Ultrasound;DME Instruction;Gait training;Stair training;Functional mobility training;Therapeutic activities;Therapeutic exercise;Balance  training;Neuromuscular re-education;Patient/family education;Manual techniques;Scar mobilization;Passive range of motion;Dry needling;Energy conservation;Taping    PT Next Visit Plan advance HEP, continue with ROM/strengthening, continue POC as previously indicated    PT Home Exercise Plan no changes to current plan this date    Consulted and Agree with Plan of Care Patient             Patient will benefit from skilled therapeutic intervention in order to improve the following deficits and impairments:  Abnormal gait, Decreased balance, Decreased endurance, Decreased mobility, Difficulty walking, Impaired sensation, Decreased range of motion, Decreased activity tolerance, Decreased strength, Increased fascial restricitons, Impaired flexibility, Pain  Visit Diagnosis: Pain in left ankle and joints of left foot  Other abnormalities of gait and mobility  Muscle weakness (generalized)  Other lack of coordination  Abnormality of gait and mobility  Difficulty in walking, not elsewhere classified     Problem List Patient Active Problem List   Diagnosis Date Noted   Ankle  injury, sequela 06/07/2020   Personal history of COVID-19 05/28/2020   Diabetic nephropathy associated with type 2 diabetes mellitus ([place]) 10/21/2019   Renal artery stenosis ([place]) 10/27/2018   Hypertensive emergency    Thoracic aortic aneurysm without rupture ([place])    Chest pain 03/10/2018   Paroxysmal tachycardia ([place]) 08/12/2017   Thyroid nodule 05/15/2017   Ascending aorta dilatation ([place]) 05/27/2016   Bicuspid aortic valve 05/27/2016   Encounter for preventive health examination 10/29/2015   Adjustment disorder with anxious mood 06/13/2014   Diabetes mellitus type 2 in obese ([place]) 03/25/2012   Aortic stenosis    Screening for colon cancer 08/25/2011   Screening for breast cancer 08/25/2011   Overweight 08/25/2011   Hyperlipidemia associated with type 2 diabetes mellitus ([place]) 08/25/2011   Cervical  spine disease 06/18/2011   Hypertension 06/18/2011    Surah Pelley PT, DPT 11/22/2020, 11:46 AM  [place] MAIN [facility] SERVICES [place], [place], [address] Phone: [phone]   Fax:  [phone]  Name: SHOSHANA JOHAL MRN: [id number] Date of Birth: 1960/07/21

## 2020-11-23 DIAGNOSIS — I10 Essential (primary) hypertension: Secondary | ICD-10-CM

## 2020-11-24 ENCOUNTER — Ambulatory Visit: Payer: PRIVATE HEALTH INSURANCE | Attending: Internal Medicine

## 2020-11-24 ENCOUNTER — Other Ambulatory Visit: Payer: Self-pay

## 2020-11-24 DIAGNOSIS — R278 Other lack of coordination: Secondary | ICD-10-CM

## 2020-11-24 DIAGNOSIS — R2689 Other abnormalities of gait and mobility: Secondary | ICD-10-CM

## 2020-11-24 DIAGNOSIS — M6281 Muscle weakness (generalized): Secondary | ICD-10-CM

## 2020-11-24 DIAGNOSIS — R269 Unspecified abnormalities of gait and mobility: Secondary | ICD-10-CM | POA: Diagnosis not present

## 2020-11-24 DIAGNOSIS — M25572 Pain in left ankle and joints of left foot: Secondary | ICD-10-CM

## 2020-11-24 MED ORDER — ALPRAZOLAM 0.25 MG PO TABS
ORAL_TABLET | Freq: Two times a day (BID) | ORAL | 5 refills | Status: DC | PRN
  Filled 2020-11-24: qty 60, fill #0
  Filled 2020-12-11: qty 60, 30d supply, fill #0
  Filled 2021-01-10: qty 60, 30d supply, fill #1
  Filled 2021-02-09 (×2): qty 60, 30d supply, fill #2
  Filled 2021-03-12: qty 60, 30d supply, fill #3

## 2020-11-24 NOTE — Therapy (Signed)
[place] [facility]IN [facility] SERVICES [address] [place], [place], [address] Phone: [phone]   Fax:  [phone]  Physical Therapy Treatment  Patient Details  Name: Courtney Lopez MRN: [id number] Date of Birth: 04-12-1961 Referring Provider (PT): Dr. Melony Overly   Encounter Date: 11/24/2020   PT End of Session - 11/24/20 1034     Visit Number 9    Number of Visits 17    Date for PT Re-Evaluation 12/06/20    Authorization Type Worker's comp, 1 eval plus 12 visits    Authorization - Visit Number 7    Authorization - Number of Visits 12    PT Start Time 0945    PT Stop Time 1028    PT Time Calculation (min) 43 min    Activity Tolerance Patient tolerated treatment well    Behavior During Therapy [facility] for tasks assessed/performed             Past Medical History:  Diagnosis Date   Ankle fracture 12/31/2018   left   Anxiety    Arthritis    neck, back, ankle, knees   Ascending aortic aneurysm ([place])    a. 04/2016 CTA: 62cm Asc TAA; b. 62/2019 CTA: 4.8cm; c. 6/62/2020 CTA: 4.9x4.8cm; d. 62/2021 Echo: 4.8 cm.   Bicuspid aortic valve    Breast screening, unspecified    Carotid arterial disease ([place])    a. 9/62 U/S: 1-39% bilat ICA stenosis. No evidence of FMD.   Cervicalgia    cervical stenosis   Diabetes mellitus without complication (HCC)    Elevated coronary artery calcium score    a. 11/62 Cardiac CT: Ca2+ = 78 (90th %'ile).   Fibromuscular dysplasia (HCC)    Heart murmur    History of hiatal hernia    Metatarsal fracture 2007   5th, from wearing high heels   Moderate aortic stenosis    a. 8/62 Echo: EF 60-65%, no rwma, mild LVH, gr1 DD, nl RV size/fxn, triv MR, mild AI, mod AS, mod-sev dil of Asc Ao - 62m.  AoV reported as tricuspid but prev known to be bicuspid.   Multiple thyroid nodules    a. 6/62 U/S: R mid thyroid nodule - f/u 5 yrs.   Other symptoms involving abdomen and pelvis(789.9)    Renal artery  stenosis ([place]    a. 8/62 Renal duplex: Nl RRA. LRA >60% stenosis.   Renal cyst, right    a. 8/62 Duplex: 5.8 x 5.7 cm R renal cyst in lower pole.   Retinal hemorrhage    Symptomatic menopausal or female climacteric states    Unspecified essential hypertension    Wears contact lenses     Past Surgical History:  Procedure Laterality Date   ABDOMINAL HYSTERECTOMY     ANKLE ARTHROSCOPY WITH RECONSTRUCTION Left 08/24/2020   Procedure: LEFT ANKLE ARTHROSCOPIC DEBRIDEMENT, EXTENSIVE, ARTHOSCROPIC TREATMENT OF TALUS OSTEOCHONDRAL LESION, LATERAL LIGAMENT RECONSTRUCTION;  Surgeon: AErle Crocker MD;  [place]  Service: Orthopedics;  Laterality: Left;   APPENDECTOMY     done during oophorectomy   CESAREAN SECTION     x 2   ETHMOIDECTOMY Left 03/02/2020   Procedure: ETHMOIDECTOMY;  Surgeon: JMargaretha [place] MD;  [place]  Service: ENT;  Laterality: Left;   gyn surgery     hysterectomy- done for adenomysosis and endometriosis   IMAGE GUIDED SINUS SURGERY N/A 03/02/2020   Procedure: IMAGE GUIDED SINUS SURGERY;  Surgeon: JMargaretha [place] MD;  [facility];  Service: ENT;  Laterality: N/A;  need disk PLACED DISK ON OR CHARGE NURSE DESK 9-17 KP   IMAGE GUIDED SINUS SURGERY Left 04/13/2020   Procedure: IMAGE GUIDED SINUS SURGERY;  Surgeon: Margaretha [place], MD;  Location: [place];  Service: ENT;  Laterality: Left;  needs stryker disk per Justice Rocher use same disk as before (October) put on charge nurses desk. DS 04/03/20   MAXILLARY ANTROSTOMY Left 03/02/2020   Procedure: MAXILLARY ANTROSTOMY;  Surgeon: Margaretha [place], MD;  Location: [place];  Service: ENT;  Laterality: Left;   MAXILLARY ANTROSTOMY Left 04/13/2020   Procedure: MAXILLECTOMY;  Surgeon: Margaretha [place], MD;  Location: [place];  Service: ENT;  Laterality: Left;   OOPHORECTOMY     unilateral, due to ruptured ovarian cysts   OSTEOTOMY  MAXILLARY     due to bite problems   TUBAL LIGATION      There were no vitals filed for this visit.   Subjective Assessment - 11/24/20 0947     Subjective Pt reports any time she performs exercises her scars feel tight. Pt reports pain currently a 2/10. Pt reports she felt fine after last appointment. She says she went upstairs and put on compression hose which helped the swelling.    Pertinent History ROSHONDA SPERL is a 60 y.o. female s/p left ankle arthroscope for osteochondral lesions due to left ankle instability.  She initially sustained an injury back in August 2020 where she had a nondisplaced fracture of her fibula.  She healed this uneventfully but then had a reinjury approximately 1 year later with resultant instability despite conservative treatment.  X-rays and MRI scan revealed an osteochondral lesion on the medial talar dome.  Prior x-rays from several years ago demonstrated similar lesion but not as severe. Symptoms are rated as moderate to severe, and have been worsening.  This is significantly impairing activities of daily living.  She is s/p surgery on 08/24/20. Per Order, patient is WBAT in boot. Patient presents to therapy with knee walker. She reports while at work she is able to use the knee scooter for 20% of the time and then the rest of the time she is supposed to be sitting. She did start using a crutch over the weekend. reports some numbness/tingling down LLE into foot/ankle;    Limitations Lifting;Standing;Walking;House hold activities;Other (comment)   work restrictions including use of boot and knee scooter   Currently in Pain? Yes    Pain Score 2     Pain Location Ankle    Pain Orientation Left    Pain Onset More than a month ago               TREATMENT:  Elasto-gel placed on dorsum and around L ankle x 10 min at beginning of session. Skin WNL once elasto-gel removed, no adverse reaction to treatment, pt reports "feels good."  Manual Therapy: Pt  supine on plinth, PT provides STM, primarily effleurage to dorsum of L foot to promote pain-free movement and decrease swelling.  PT then provides the following: Scar tissue massage over two incision sites laterally and on dorsum of L ankle: perpendicular, along length of scar, and circles for pain modulation and to promote circulation to tissue, mobility, and then followed by rolling with pinching technique as pt tolerates all massage well with no adverse reaction to treatment x 5 min per scar site. By end of scar massage skin continues to appear WNL, and no pain reported.  PT instructs in safe scar massage technique, sx to watch out for and to discontinue scar massage if present (such as injury to skin/broken skin, excessive pain, signs of inflammation). PT instructs for pt to start gently and perform 5 min per scar site. Instructs pt to use massage cream that does not have added perfumes. Pt verbalizes understanding to to all..  PT performed grade II-III AP mobs to left talocrual joint 20-30 sec bouts with passive over pressure into ankle DF x multiple reps. PT provides stretch into PF - 2x30 sec PT provides stretch of L digits into extension and flexion, along with AAROM - x multiple reps AAROM PF and DF of L ankle - 10x PT performed grade II-III AP/PA mobs at each metatarsal along met heads, 15-20 sec bouts x2 sets each with slight discomfort continued to be reported along 1st ray. Followed with stretch into big toe and digit extension 2x30 sec.    Exercise: Instructed patient in LE strengthening:   Seated: Green tband PF 3x10 reps each;    Isometric great toe extension 5x10 sec hold with good tolerance reported  Great to and digit flexion - 5x.   Seated L LE towel scrunches - 2 min    PT Education - 11/24/20 1034     Education Details exercise technique, body mechanics, scar massage technique    Person(s) Educated Patient    Methods Explanation;Demonstration;Tactile cues;Verbal  cues    Comprehension Verbalized understanding              PT Short Term Goals - 10/11/20 1504       PT SHORT TERM GOAL #1   Title Patient will be adherent to HEP at least 3x a week to improve functional strength and balance for better safety at home.    Time 4    Period Weeks    Status New    Target Date 11/08/20      PT SHORT TERM GOAL #2   Title Patient will improve LLE ankle AROM: DF: 10 degrees, PF: 60 degrees, IV: 30 degrees, EV: 10 degrees to improve ankle ROM for ADLs including gait and transfers;    Time 4    Period Weeks    Status New    Target Date 11/08/20               PT Long Term Goals - 10/11/20 1505       PT LONG TERM GOAL #1   Title Patient will increase 10 meter walk test to >1.38ms as to improve gait speed for better community ambulation and to reduce fall risk.    Time 8    Period Weeks    Status New    Target Date 12/06/20      PT LONG TERM GOAL #2   Title Patient will be independent with ascend/descend 8 steps using single UE in step over step pattern without LOB.    Time 8    Period Weeks    Status New    Target Date 12/06/20      PT LONG TERM GOAL #3   Title Patient will improve LLE ankle strength to grossly 4/5 to indicate improved strength for gait and walking tasks.    Time 8    Period Weeks    Status New    Target Date 12/06/20      PT LONG TERM GOAL #4   Title Patient will tolerate 5 seconds of single leg stance without loss of balance to improve  ability to get in and out of shower safely.    Time 8    Period Weeks    Status New    Target Date 12/06/20      PT LONG TERM GOAL #5   Title Patient will improve FOTO score to >60% to indicate improved functional mobility with ADLs.    Time 8    Period Weeks    Status New    Target Date 12/06/20      Additional Long Term Goals   Additional Long Term Goals Yes      PT LONG TERM GOAL #6   Title Patient will report a worst pain of 3/10 on VAS in   left ankle          to  improve tolerance with ADLs and reduced symptoms with activities.    Time 8    Period Weeks    Status New    Target Date 12/06/20                   Plan - 11/24/20 1035     Clinical Impression Statement PT initiated scar tissue massage this session and instructed pt in safe techniques as not to increase pain or injure skin. Continued focus on mobilization of L ankle and digits as well to facilitate mobility. Pt tolerates all treatment well, reporting it feels good, and with no adverse reaction observered or reported. The pt will benefit from further skilled PT to improve ROM, strength of LLE and reduce pain with ADLs.    Personal Factors and Comorbidities Comorbidity 3+;Past/Current Experience;Time since onset of injury/illness/exacerbation    Comorbidities anxiety, arthritis, DMx2, fibromuscular dysplasia, HTN,    Examination-Activity Limitations Caring for Others;Carry;Locomotion Level;Squat;Stairs;Stand;Transfers    Examination-Participation Restrictions Cleaning;Community Activity;Occupation;Shop;Volunteer;Yard Work    Stability/Clinical Decision Making Stable/Uncomplicated    Rehab Potential Good    PT Frequency 2x / week    PT Duration 8 weeks    PT Treatment/Interventions Cryotherapy;Electrical Stimulation;Moist Heat;Ultrasound;DME Instruction;Gait training;Stair training;Functional mobility training;Therapeutic activities;Therapeutic exercise;Balance training;Neuromuscular re-education;Patient/family education;Manual techniques;Scar mobilization;Passive range of motion;Dry needling;Energy conservation;Taping    PT Next Visit Plan advance HEP, continue with ROM/strengthening, continue POC as previously indicated    PT Home Exercise Plan no changes to current plan this date    Consulted and Agree with Plan of Care Patient             Patient will benefit from skilled therapeutic intervention in order to improve the following deficits and impairments:  Abnormal gait,  Decreased balance, Decreased endurance, Decreased mobility, Difficulty walking, Impaired sensation, Decreased range of motion, Decreased activity tolerance, Decreased strength, Increased fascial restricitons, Impaired flexibility, Pain  Visit Diagnosis: Muscle weakness (generalized)  Other abnormalities of gait and mobility  Other lack of coordination  Pain in left ankle and joints of left foot     Problem List Patient Active Problem List   Diagnosis Date Noted   Ankle injury, sequela 06/07/2020   Personal history of COVID-19 05/28/2020   Diabetic nephropathy associated with type 2 diabetes mellitus ([place]) 10/21/2019   Renal artery stenosis ([place]) 10/27/2018   Hypertensive emergency    Thoracic aortic aneurysm without rupture ([place])    Chest pain 03/10/2018   Paroxysmal tachycardia ([place]) 08/12/2017   Thyroid nodule 05/15/2017   Ascending aorta dilatation ([place]) 05/27/2016   Bicuspid aortic valve 05/27/2016   Encounter for preventive health examination 10/29/2015   Adjustment disorder with anxious mood 06/13/2014   Diabetes mellitus type 2 in obese ([place]) 03/25/2012  Aortic stenosis    Screening for colon cancer 08/25/2011   Screening for breast cancer 08/25/2011   Overweight 08/25/2011   Hyperlipidemia associated with type 2 diabetes mellitus ([place]) 08/25/2011   Cervical spine disease 06/18/2011   Hypertension 06/18/2011   Ricard Dillon PT, DPT 11/24/2020, 10:52 AM  [place] MAIN [facility] SERVICES [address] [place], [place], [address] Phone: [phone]   Fax:  [phone]  Name: MALENY CANDY MRN: [id number] Date of Birth: [birth year]

## 2020-11-24 NOTE — Assessment & Plan Note (Signed)
Advised to increase hydralazine to 20 mg tid for recent elevations due to family health issues (both grandchildren have been diagnosed with pediatric malignancies)

## 2020-11-27 ENCOUNTER — Encounter: Payer: 59 | Admitting: Physical Therapy

## 2020-11-29 ENCOUNTER — Ambulatory Visit: Payer: PRIVATE HEALTH INSURANCE

## 2020-11-29 ENCOUNTER — Encounter: Payer: 59 | Admitting: Physical Therapy

## 2020-12-01 ENCOUNTER — Other Ambulatory Visit: Payer: Self-pay

## 2020-12-01 ENCOUNTER — Ambulatory Visit: Payer: PRIVATE HEALTH INSURANCE

## 2020-12-01 DIAGNOSIS — M6281 Muscle weakness (generalized): Secondary | ICD-10-CM

## 2020-12-01 DIAGNOSIS — R2689 Other abnormalities of gait and mobility: Secondary | ICD-10-CM

## 2020-12-01 DIAGNOSIS — R269 Unspecified abnormalities of gait and mobility: Secondary | ICD-10-CM | POA: Diagnosis not present

## 2020-12-01 DIAGNOSIS — M25572 Pain in left ankle and joints of left foot: Secondary | ICD-10-CM

## 2020-12-01 NOTE — Therapy (Signed)
[facility] [facility] MAIN [facility] [address] [place], [place], [address] Phone: [phone]   Fax:  [phone]  Physical Therapy Treatment/Physical Therapy Progress Note   Dates of reporting period  10/11/2020   to   12/01/2020   Patient Details  Name: Courtney Lopez MRN: [id number] Date of Birth: 03/02/1961 Referring Provider (PT): Dr. Dub Mikeshristopher Adair   Encounter Date: 12/01/2020   PT End of Session - 12/01/20 1039     Visit Number 10    Number of Visits 17    Date for PT Re-Evaluation 12/06/20    Authorization Type Worker's comp, 1 eval plus 12 visits    Authorization - Visit Number 7    Authorization - Number of Visits 12    PT Start Time [id number]    PT Stop Time 0931    PT Time Calculation (min) 45 min    Activity Tolerance Patient tolerated treatment well    Behavior [facility] for tasks assessed/performed             Past Medical History:  Diagnosis Date   Ankle fracture 12/31/2018   left   Anxiety    Arthritis    neck, back, ankle, knees   Ascending aortic aneurysm (HCC)    a. 04/2016 CTA: 4.6cm Asc TAA; b. 04/2018 CTA: 4.8cm; c. 10/2018 CTA: 4.9x4.8cm; d. 12/2019 Echo: 4.8 cm.   Bicuspid aortic valve    Breast screening, unspecified    Carotid arterial disease (HCC)    a. 01/2020 U/S: 1-39% bilat ICA stenosis. No evidence of FMD.   Cervicalgia    cervical stenosis   Diabetes mellitus without complication (HCC)    Elevated coronary artery calcium score    a. 03/2016 Cardiac CT: Ca2+ = 78 (90th %'ile).   Fibromuscular dysplasia (HCC)    Heart murmur    History of hiatal hernia    Metatarsal fracture 2007   5th, from wearing high heels   Moderate aortic stenosis    a. 12/2019 Echo: EF 60-65%, no rwma, mild LVH, gr1 DD, nl RV size/fxn, triv MR, mild AI, mod AS, mod-sev dil of Asc Ao - 48mm.  AoV reported as tricuspid but prev known to be bicuspid.   Multiple thyroid nodules    a. 10/2019 U/S: R mid thyroid  nodule - f/u 5 yrs.   Other symptoms involving abdomen and pelvis(789.9)    Renal artery stenosis (HCC)    a. 12/2019 Renal duplex: Nl RRA. LRA >60% stenosis.   Renal cyst, right    a. 12/2019 Duplex: 5.8 x 5.7 cm R renal cyst in lower pole.   Retinal hemorrhage    Symptomatic menopausal or female climacteric states    Unspecified essential hypertension    Wears contact lenses     Past Surgical History:  Procedure Laterality Date   ABDOMINAL HYSTERECTOMY     ANKLE ARTHROSCOPY WITH RECONSTRUCTION Left 08/24/2020   Procedure: LEFT ANKLE ARTHROSCOPIC DEBRIDEMENT, EXTENSIVE, ARTHOSCROPIC TREATMENT OF TALUS OSTEOCHONDRAL LESION, LATERAL LIGAMENT RECONSTRUCTION;  Surgeon: Terance HartAdair, Christopher R, MD;  Location: [facility];  Service: Orthopedics;  Laterality: Left;   APPENDECTOMY     done during oophorectomy   CESAREAN SECTION     x 2   ETHMOIDECTOMY Left 03/02/2020   Procedure: ETHMOIDECTOMY;  Surgeon: Vernie MurdersJuengel, Paul, MD;  [facility];  Service: ENT;  Laterality: Left;   gyn surgery     hysterectomy- done for adenomysosis and endometriosis   IMAGE GUIDED SINUS  SURGERY N/A 03/02/2020   Procedure: IMAGE GUIDED SINUS SURGERY;  Surgeon: Vernie Murders, MD;  Location: [facility];  Service: ENT;  Laterality: N/A;  need disk PLACED DISK ON OR CHARGE NURSE DESK 9-17 KP   IMAGE GUIDED SINUS SURGERY Left 04/13/2020   Procedure: IMAGE GUIDED SINUS SURGERY;  Surgeon: Vernie Murders, MD;  Location: [facility];  Service: ENT;  Laterality: Left;  needs stryker disk per Gloriajean Dell use same disk as before (October) put on charge nurses desk. DS 04/03/20   MAXILLARY ANTROSTOMY Left 03/02/2020   Procedure: MAXILLARY ANTROSTOMY;  Surgeon: Vernie Murders, MD;  Location: [facility];  Service: ENT;  Laterality: Left;   MAXILLARY ANTROSTOMY Left 04/13/2020   Procedure: MAXILLECTOMY;  Surgeon: Vernie Murders, MD;  Location: [facility];  Service: ENT;   Laterality: Left;   OOPHORECTOMY     unilateral, due to ruptured ovarian cysts   OSTEOTOMY MAXILLARY     due to bite problems   TUBAL LIGATION      There were no vitals filed for this visit.   Subjective Assessment - 12/01/20 1003     Subjective Pt reports she wore soft ankle brace around home for 4 hours yesterday and that she had increased pain on L dorsum of foot near metatarsal heads and into toes later that evening. She reports her toes ache somewhat today but pain has improved (pain increased to about 4-5/10 at the time). She reports no pain with ascending/descending stairs in her home in soft-brace.    Pertinent History Courtney Lopez is a 60 y.o. female s/p left ankle arthroscope for osteochondral lesions due to left ankle instability.  She initially sustained an injury back in August 2020 where she had a nondisplaced fracture of her fibula.  She healed this uneventfully but then had a reinjury approximately 1 year later with resultant instability despite conservative treatment.  X-rays and MRI scan revealed an osteochondral lesion on the medial talar dome.  Prior x-rays from several years ago demonstrated similar lesion but not as severe. Symptoms are rated as moderate to severe, and have been worsening.  This is significantly impairing activities of daily living.  She is s/p surgery on 08/24/20. Per Order, patient is WBAT in boot. Patient presents to therapy with knee walker. She reports while at work she is able to use the knee scooter for 20% of the time and then the rest of the time she is supposed to be sitting. She did start using a crutch over the weekend. reports some numbness/tingling down LLE into foot/ankle;    Limitations Lifting;Standing;Walking;House hold activities;Other (comment)   work restrictions including use of boot and knee scooter   Currently in Pain? Yes    Pain Location Ankle    Pain Orientation Left    Pain Onset More than a month ago             TREATMENT  Reassessment of goals for progress note  HEP: Pt indep with HEP and performing at least 3x/week (achieved)  Ascend/descend steps: Pt demos ability to ascend/descend 8 steps with reciprocal pattern and use of UUE support. No LOB. Pt reports no pain. (Achieved)  MMT:  LLE: DF, PF, IV/EV all 5/5 (achieved)  AROM LLE: PF: 50 deg. DF 10 deg. IV/EV approx. 15-20 deg for each  - 1.16 m/s (achieved)  SLB- deferred to future session  FOTO: 43  Worst pain pt reports is 4-5/10   PT instructs pt in gradually increasing time  spent in soft-brace, as pt reports she attempted wearing it for 4 hours yesterday. PT instructs pt to also monitor for increased pain and swelling. PT suggests pt first attempt 30 min to an hour in soft-brace while at home and monitor for sx response and to return to boot, continue ice massage should she experience excessive pain, swelling. Pt verbalizes understanding.   Interventions  Elasto-gel placed on dorsum and around L ankle x 10 min. Skin WNL once elasto-gel removed, no adverse reaction to treatment.   Manual Therapy: Pt supine on plinth, PT provides STM, primarily effleurage to dorsum of L foot to promote pain-free movement and decrease swelling.  PT then provides the following: Scar tissue STM over two incision sites laterally and on dorsum of L ankle: perpendicular, along length of scar, and circles for pain modulation and to promote circulation to tissue, mobility, and then followed by rolling with pinching technique as pt tolerates all STM well with no adverse reaction to treatment x 5 min per scar site. By end of treatment skin continues to appear WNL, and improvement in sx reported.  PT performed grade II-III AP mobs to left talocrual joint 20-30 sec bouts with passive over pressure into ankle DF x multiple reps. PT provides stretch into PF - 2x30 sec LLE PT provides stretch of L great toe and L digits into extension and flexion, along  with AAROM - x 4 min  At end of session pt reports overall improvement in LLE sx.  Patient's condition has the potential to improve in response to therapy. Maximum improvement is yet to be obtained. The anticipated improvement is attainable and reasonable in a generally predictable time.      PT Short Term Goals - 12/01/20 1100       PT SHORT TERM GOAL #1   Title Patient will be adherent to HEP at least 3x a week to improve functional strength and balance for better safety at home.    Baseline 7/22: pt indep with HEP, performs at least 3x/week    Time 4    Period Weeks    Status Achieved    Target Date 11/08/20      PT SHORT TERM GOAL #2   Title Patient will improve LLE ankle AROM: DF: 10 degrees, PF: 60 degrees, IV: 30 degrees, EV: 10 degrees to improve ankle ROM for ADLs including gait and transfers;    Baseline 7/22: PF 50 deg, DF 10 deg., IV and EV approx 15-20 deg.    Time 4    Period Weeks    Status On-going    Target Date 12/29/20               PT Long Term Goals - 12/01/20 1103       PT LONG TERM GOAL #1   Title Patient will increase 10 meter walk test to >1.55m/s as to improve gait speed for better community ambulation and to reduce fall risk.    Baseline 7/22: 1.16 m/s    Time 8    Period Weeks    Status Achieved      PT LONG TERM GOAL #2   Title Patient will be independent with ascend/descend 8 steps using single UE in step over step pattern without LOB.    Baseline 7/22: ascends/descends 8 steps using single UE and recip. pattern, no LOB    Time 8    Period Weeks    Status Achieved      PT LONG TERM GOAL #3  Title Patient will improve LLE ankle strength to grossly 4/5 to indicate improved strength for gait and walking tasks.    Baseline 7/22: DF/PF/IV/EV all 5/5    Time 8    Period Weeks    Status Achieved      PT LONG TERM GOAL #4   Title Patient will tolerate 5 seconds of single leg stance without loss of balance to improve ability to get in and  out of shower safely.    Baseline 7/22: deferred    Time 8    Period Weeks    Status New    Target Date 12/06/20      PT LONG TERM GOAL #5   Title Patient will improve FOTO score to >60% to indicate improved functional mobility with ADLs.    Baseline 7/22: 43    Time 8    Period Weeks    Status On-going    Target Date 12/06/20      PT LONG TERM GOAL #6   Title Patient will report a worst pain of 3/10 on VAS in   left ankle          to improve tolerance with ADLs and reduced symptoms with activities.    Time 8    Period Weeks    Status New               Plan - 12/01/20 1105     Clinical Impression Statement Goals reassessed for progress note. Pt making gains toward goals, and achieved four of her PT goals, indicating indep with HEP, improved LLE strength, gait speed, and ability to ascend/descend stairs. While pt did not achieve all her goals, she is making progress with L ankle ROM and with overall pain. However, worst pain experienced is still 4-5/10, largely d/t pt recent attempt to increase amb. time in soft-brace. Patient's condition has the potential to improve in response to therapy. Maximum improvement is yet to be obtained. The anticipated improvement is attainable and reasonable in a generally predictable time.The pt will benefit from further skilled PT to continue to improve pain, LLE ROM, endurance/strength, and gait mechanics in order to increase ease and safety with all functional mobility.    Personal Factors and Comorbidities Comorbidity 3+;Past/Current Experience;Time since onset of injury/illness/exacerbation    Comorbidities anxiety, arthritis, DMx2, fibromuscular dysplasia, HTN,    Examination-Activity Limitations Caring for Others;Carry;Locomotion Level;Squat;Stairs;Stand;Transfers    Examination-Participation Restrictions Cleaning;Community Activity;Occupation;Shop;Volunteer;Yard Work    Stability/Clinical Decision Making Stable/Uncomplicated    Rehab  Potential Good    PT Frequency 2x / week    PT Duration 8 weeks    PT Treatment/Interventions Cryotherapy;Electrical Stimulation;Moist Heat;Ultrasound;DME Instruction;Gait training;Stair training;Functional mobility training;Therapeutic activities;Therapeutic exercise;Balance training;Neuromuscular re-education;Patient/family education;Manual techniques;Scar mobilization;Passive range of motion;Dry needling;Energy conservation;Taping    PT Next Visit Plan advance HEP, continue with ROM/strengthening, continue POC as previously indicated    PT Home Exercise Plan no changes to current plan this date    Consulted and Agree with Plan of Care Patient             Patient will benefit from skilled therapeutic intervention in order to improve the following deficits and impairments:  Abnormal gait, Decreased balance, Decreased endurance, Decreased mobility, Difficulty walking, Impaired sensation, Decreased range of motion, Decreased activity tolerance, Decreased strength, Increased fascial restricitons, Impaired flexibility, Pain  Visit Diagnosis: Other abnormalities of gait and mobility  Pain in left ankle and joints of left foot  Muscle weakness (generalized)     Problem List Patient Active  Problem List   Diagnosis Date Noted   Ankle injury, sequela 06/07/2020   Personal history of COVID-19 05/28/2020   Diabetic nephropathy associated with type 2 diabetes mellitus (HCC) 10/21/2019   Renal artery stenosis (HCC) 10/27/2018   Hypertensive emergency    Thoracic aortic aneurysm without rupture (HCC)    Chest pain 03/10/2018   Paroxysmal tachycardia (HCC) 08/12/2017   Thyroid nodule 05/15/2017   Ascending aorta dilatation (HCC) 05/27/2016   Bicuspid aortic valve 05/27/2016   Encounter for preventive health examination 10/29/2015   Adjustment disorder with anxious mood 06/13/2014   Diabetes mellitus type 2 in obese (HCC) 03/25/2012   Aortic stenosis    Screening for colon cancer  08/25/2011   Screening for breast cancer 08/25/2011   Overweight 08/25/2011   Hyperlipidemia associated with type 2 diabetes mellitus (HCC) 08/25/2011   Cervical spine disease 06/18/2011   Hypertension 06/18/2011   Temple Pacini PT, DPT 12/01/2020, 11:12 AM  [place] [facility] MAIN [facility] SERVICES [address] [place], [place], [address] Phone: [phone]   Fax:  [phone]  Name: Courtney Lopez MRN: [id number] Date of Birth: 22-Jun-1960

## 2020-12-05 ENCOUNTER — Ambulatory Visit: Payer: PRIVATE HEALTH INSURANCE | Attending: Orthopaedic Surgery

## 2020-12-05 ENCOUNTER — Other Ambulatory Visit: Payer: Self-pay

## 2020-12-05 DIAGNOSIS — M6281 Muscle weakness (generalized): Secondary | ICD-10-CM | POA: Insufficient documentation

## 2020-12-05 DIAGNOSIS — R2689 Other abnormalities of gait and mobility: Secondary | ICD-10-CM | POA: Insufficient documentation

## 2020-12-05 DIAGNOSIS — R269 Unspecified abnormalities of gait and mobility: Secondary | ICD-10-CM | POA: Diagnosis not present

## 2020-12-05 DIAGNOSIS — R278 Other lack of coordination: Secondary | ICD-10-CM | POA: Insufficient documentation

## 2020-12-05 NOTE — Therapy (Signed)
[address] [employer] MAIN [facility] SERVICES [address] [place], [place], [address] Phone: [phone]   Fax:  [phone]  Physical Therapy Treatment/RECERT  Patient Details  Name: Courtney Lopez MRN: [id number] Date of Birth: 01/17/1961 Referring Provider (PT): Dr. Dub Mikes   Encounter Date: 12/05/2020   PT End of Session - 12/06/20 1120     Visit Number 11    Number of Visits 27    Date for PT Re-Evaluation 12/06/20    Authorization Type Worker's comp, 1 eval plus 12 visits    Authorization - Visit Number 7    Authorization - Number of Visits 12    PT Start Time [phone]    PT Stop Time 1030    PT Time Calculation (min) 41 min    Activity Tolerance Patient tolerated treatment well    Behavior During Therapy [facility] for tasks assessed/performed             Past Medical History:  Diagnosis Date   Ankle fracture 12/31/2018   left   Anxiety    Arthritis    neck, back, ankle, knees   Ascending aortic aneurysm (HCC)    a. 04/2016 CTA: 4.6cm Asc TAA; b. 04/2018 CTA: 4.8cm; c. 10/2018 CTA: 4.9x4.8cm; d. 12/2019 Echo: 4.8 cm.   Bicuspid aortic valve    Breast screening, unspecified    Carotid arterial disease (HCC)    a. 01/2020 U/S: 1-39% bilat ICA stenosis. No evidence of FMD.   Cervicalgia    cervical stenosis   Diabetes mellitus without complication (HCC)    Elevated coronary artery calcium score    a. 03/2016 Cardiac CT: Ca2+ = 78 (90th %'ile).   Fibromuscular dysplasia (HCC)    Heart murmur    History of hiatal hernia    Metatarsal fracture 2007   5th, from wearing high heels   Moderate aortic stenosis    a. 12/2019 Echo: EF 60-65%, no rwma, mild LVH, gr1 DD, nl RV size/fxn, triv MR, mild AI, mod AS, mod-sev dil of Asc Ao - 31mm.  AoV reported as tricuspid but prev known to be bicuspid.   Multiple thyroid nodules    a. 10/2019 U/S: R mid thyroid nodule - f/u 5 yrs.   Other symptoms involving abdomen and pelvis(789.9)    Renal  artery stenosis (HCC)    a. 12/2019 Renal duplex: Nl RRA. LRA >60% stenosis.   Renal cyst, right    a. 12/2019 Duplex: 5.8 x 5.7 cm R renal cyst in lower pole.   Retinal hemorrhage    Symptomatic menopausal or female climacteric states    Unspecified essential hypertension    Wears contact lenses     Past Surgical History:  Procedure Laterality Date   ABDOMINAL HYSTERECTOMY     ANKLE ARTHROSCOPY WITH RECONSTRUCTION Left 08/24/2020   Procedure: LEFT ANKLE ARTHROSCOPIC DEBRIDEMENT, EXTENSIVE, ARTHOSCROPIC TREATMENT OF TALUS OSTEOCHONDRAL LESION, LATERAL LIGAMENT RECONSTRUCTION;  Surgeon: Terance Hart, MD;  Location: [facility];  Service: Orthopedics;  Laterality: Left;   APPENDECTOMY     done during oophorectomy   CESAREAN SECTION     x 2   ETHMOIDECTOMY Left 03/02/2020   Procedure: ETHMOIDECTOMY;  Surgeon: Vernie Murders, MD;  Location: [facility];  Service: ENT;  Laterality: Left;   gyn surgery     hysterectomy- done for adenomysosis and endometriosis   IMAGE GUIDED SINUS SURGERY N/A 03/02/2020   Procedure: IMAGE GUIDED SINUS SURGERY;  Surgeon: Vernie Murders, MD;  Location: [facility];  Service: ENT;  Laterality: N/A;  need disk PLACED DISK ON OR CHARGE NURSE DESK 9-17 KP   IMAGE GUIDED SINUS SURGERY Left 04/13/2020   Procedure: IMAGE GUIDED SINUS SURGERY;  Surgeon: Vernie Murders, MD;  Location: [facility];  Service: ENT;  Laterality: Left;  needs [employer]ker disk per Gloriajean Dell use same disk as before (October) put on charge nurses desk. DS 04/03/20   MAXILLARY ANTROSTOMY Left 03/02/2020   Procedure: MAXILLARY ANTROSTOMY;  Surgeon: Vernie Murders, MD;  Location: [facility];  Service: ENT;  Laterality: Left;   MAXILLARY ANTROSTOMY Left 04/13/2020   Procedure: MAXILLECTOMY;  Surgeon: Vernie Murders, MD;  Location: [facility];  Service: ENT;  Laterality: Left;   OOPHORECTOMY     unilateral, due to ruptured ovarian cysts   OSTEOTOMY  MAXILLARY     due to bite problems   TUBAL LIGATION      There were no vitals filed for this visit.   Subjective Assessment - 12/05/20 0948     Subjective Pt reports she feels she is walking better. She reports 0/10 pain currently. Says she has been using soft-brace without increase in pain over the past several days.    Pertinent History SABEL HORNBECK is a 60 y.o. female s/p left ankle arthroscope for osteochondral lesions due to left ankle instability.  She initially sustained an injury back in August 2020 where she had a nondisplaced fracture of her fibula.  She healed this uneventfully but then had a reinjury approximately 1 year later with resultant instability despite conservative treatment.  X-rays and MRI scan revealed an osteochondral lesion on the medial talar dome.  Prior x-rays from several years ago demonstrated similar lesion but not as severe. Symptoms are rated as moderate to severe, and have been worsening.  This is significantly impairing activities of daily living.  She is s/p surgery on 08/24/20. Per Order, patient is WBAT in boot. Patient presents to therapy with knee walker. She reports while at work she is able to use the knee scooter for 20% of the time and then the rest of the time she is supposed to be sitting. She did start using a crutch over the weekend. reports some numbness/tingling down LLE into foot/ankle;    Limitations Lifting;Standing;Walking;House hold activities;Other (comment)   work restrictions including use of boot and knee scooter   Currently in Pain? No/denies    Pain Onset More than a month ago             TREATMENT  Interventions Elasto-gel placed on dorsum and around L ankle x 5 min. Skin WNL once elasto-gel removed, no adverse reaction to treatment.  Manual Therapy: Pt supine on plinth, PT provides STM, primarily effleurage to dorsum of L foot to promote pain-free movement and decrease swelling x 3 min.  PT then provides the  following: Scar tissue STM over two incision sites laterally and on dorsum of L ankle: perpendicular to promote circulation to tissue, mobility, and then followed by rolling with pinching technique as pt tolerates all STM well with no adverse reaction to treatment x 2 min per scar site. By end of treatment skin continues to appear WNL, and improvement in sx reported.  PT performed grade II-III AP mobs to left talocrual joint 4x30 sec bouts with passive over pressure into ankle DF   PROM PF/DF with overpressure, followed by PT providing stretch for gastrocsol of LLE - x multiple reps of PROM and stretch for 2x30 sec PROM  LLE digit and great toe extension 10x with 30 sec hold with last rep for stretch.  THEREX-  Seated LLE ankle DF/PF rocker board - 5 reps for two sets, 5 sec holds in end-range position Seated LLE ankle dynadisc, forward/backward, side-to-side, CC/CW - 5 reps of each Towel scrunches with LLE - 2 min Seated AAROM with belt for LLE gastrocsol stretch - 2x30 sec Seated GTB L ankle abduction/adduction 2x10 for each  Standing: BLE heel raises, partial ROM -  2x10; rates medium Standing DF partial ROM - 2x10 B Standing LLE gastrocsol stretch 2x30 sec LLE ankle IV/EV dynadisc board forward/backward, side-to-side, CW/CC - x multiple reps  Patient's condition has the potential to improve in response to therapy. Maximum improvement is yet to be obtained. The anticipated improvement is attainable and reasonable in a generally predictable time.       PT Short Term Goals - 12/06/20 1139       PT SHORT TERM GOAL #1   Title Patient will be adherent to HEP at least 3x a week to improve functional strength and balance for better safety at home.    Baseline 7/22: pt indep with HEP, performs at least 3x/week    Time 4    Period Weeks    Status Achieved    Target Date 11/08/20      PT SHORT TERM GOAL #2   Title Patient will improve LLE ankle AROM: DF: 10 degrees, PF: 60 degrees, IV:  30 degrees, EV: 10 degrees to improve ankle ROM for ADLs including gait and transfers;    Baseline 7/22: PF 50 deg, DF 10 deg., IV and EV approx 15-20 deg.    Time 4    Period Weeks    Status On-going    Target Date 12/29/20               PT Long Term Goals - 12/06/20 1139       PT LONG TERM GOAL #1   Title Patient will increase 10 meter walk test to >1.80m/s as to improve gait speed for better community ambulation and to reduce fall risk.    Baseline 7/22: 1.16 m/s    Time 8    Period Weeks    Status Achieved      PT LONG TERM GOAL #2   Title Patient will be independent with ascend/descend 8 steps using single UE in step over step pattern without LOB.    Baseline 7/22: ascends/descends 8 steps using single UE and recip. pattern, no LOB    Time 8    Period Weeks    Status Achieved      PT LONG TERM GOAL #3   Title Patient will improve LLE ankle strength to grossly 4/5 to indicate improved strength for gait and walking tasks.    Baseline 7/22: DF/PF/IV/EV all 5/5    Time 8    Period Weeks    Status Achieved      PT LONG TERM GOAL #4   Title Patient will tolerate 5 seconds of single leg stance without loss of balance to improve ability to get in and out of shower safely.    Baseline 7/22: deferred    Time 8    Period Weeks    Status New    Target Date 01/31/21      PT LONG TERM GOAL #5   Title Patient will improve FOTO score to >60% to indicate improved functional mobility with ADLs.    Baseline 7/22: 43  Time 8    Period Weeks    Status On-going    Target Date 01/31/21      PT LONG TERM GOAL #6   Title Patient will report a worst pain of 3/10 on VAS in   left ankle          to improve tolerance with ADLs and reduced symptoms with activities.    Time 8    Period Weeks    Status New    Target Date 01/31/21                   Plan - 12/06/20 1134     Clinical Impression Statement Please refer to recent progress note (12/01/2020) for goal  reassessment. Recert completed on this date. Over reporting period pt has shown improvements in LLE pain, strength and ROM. The pt is now reporting she is tolerating increased time in soft-brace without increased pain and is walking better. She was able to progress to more standing therex this session, but was educated to modify activity for remainder of day to offload LLE for rest of day. Pt verbalized understanding. While pt is making progress, she still demonstrates some deficits in LLE ROM and with gait mechanics, strength, and activity tolerance. The pt would benefit from further skilled PT to continue to improve pain, LLE ROM, strength and gait ability/mechanics to increase ease and safety with all funcitonal mobility.    Personal Factors and Comorbidities Comorbidity 3+;Past/Current Experience;Time since onset of injury/illness/exacerbation    Comorbidities anxiety, arthritis, DMx2, fibromuscular dysplasia, HTN,    Examination-Activity Limitations Caring for Others;Carry;Locomotion Level;Squat;Stairs;Stand;Transfers    Examination-Participation Restrictions Cleaning;Community Activity;Occupation;Shop;Volunteer;Yard Work    Stability/Clinical Decision Making Stable/Uncomplicated    Rehab Potential Good    PT Frequency 2x / week    PT Duration 8 weeks    PT Treatment/Interventions Cryotherapy;Electrical Stimulation;Moist Heat;Ultrasound;DME Instruction;Gait training;Stair training;Functional mobility training;Therapeutic activities;Therapeutic exercise;Balance training;Neuromuscular re-education;Patient/family education;Manual techniques;Scar mobilization;Passive range of motion;Dry needling;Energy conservation;Taping    PT Next Visit Plan advance HEP, continue with ROM/strengthening, continue POC as previously indicated    PT Home Exercise Plan no changes to current plan this date    Consulted and Agree with Plan of Care Patient             Patient will benefit from skilled therapeutic  intervention in order to improve the following deficits and impairments:  Abnormal gait, Decreased balance, Decreased endurance, Decreased mobility, Difficulty walking, Impaired sensation, Decreased range of motion, Decreased activity tolerance, Decreased strength, Increased fascial restricitons, Impaired flexibility, Pain  Visit Diagnosis: Muscle weakness (generalized)  Other abnormalities of gait and mobility  Other lack of coordination     Problem List Patient Active Problem List   Diagnosis Date Noted   Ankle injury, sequela 06/07/2020   Personal history of COVID-19 05/28/2020   Diabetic nephropathy associated with type 2 diabetes mellitus (HCC) 10/21/2019   Renal artery stenosis (HCC) 10/27/2018   Hypertensive emergency    Thoracic aortic aneurysm without rupture (HCC)    Chest pain 03/10/2018   Paroxysmal tachycardia (HCC) 08/12/2017   Thyroid nodule 05/15/2017   Ascending aorta dilatation (HCC) 05/27/2016   Bicuspid aortic valve 05/27/2016   Encounter for preventive health examination 10/29/2015   Adjustment disorder with anxious mood 06/13/2014   Diabetes mellitus type 2 in obese (HCC) 03/25/2012   Aortic stenosis    Screening for colon cancer 08/25/2011   Screening for breast cancer 08/25/2011   Overweight 08/25/2011   Hyperlipidemia associated with type 2  diabetes mellitus (HCC) 08/25/2011   Cervical spine disease 06/18/2011   Hypertension 06/18/2011   Temple PaciniHaley Antwone Capozzoli PT, DPT 12/06/2020, 11:40 AM  [facility] [address] [place], [place], [id number] Phone: [phone]   Fax:  [phone]  Name: Sandria SenterStephanie E Wiedeman MRN: [id number] Date of Birth: 07/29/1960

## 2020-12-08 ENCOUNTER — Ambulatory Visit: Payer: PRIVATE HEALTH INSURANCE

## 2020-12-11 ENCOUNTER — Other Ambulatory Visit: Payer: Self-pay

## 2020-12-11 ENCOUNTER — Other Ambulatory Visit: Payer: Self-pay | Admitting: Internal Medicine

## 2020-12-11 DIAGNOSIS — J069 Acute upper respiratory infection, unspecified: Secondary | ICD-10-CM

## 2020-12-11 MED FILL — Fluticasone Propionate Nasal Susp 50 MCG/ACT: NASAL | 30 days supply | Qty: 16 | Fill #0 | Status: AC

## 2020-12-12 ENCOUNTER — Other Ambulatory Visit: Payer: Self-pay

## 2020-12-12 ENCOUNTER — Other Ambulatory Visit: Payer: Self-pay | Admitting: Internal Medicine

## 2020-12-12 DIAGNOSIS — J069 Acute upper respiratory infection, unspecified: Secondary | ICD-10-CM

## 2020-12-12 MED ORDER — FLUTICASONE PROPIONATE 50 MCG/ACT NA SUSP
2.0000 | Freq: Every day | NASAL | 2 refills | Status: DC
  Filled 2020-12-12 – 2021-05-02 (×2): qty 16, 30d supply, fill #0
  Filled 2021-06-08: qty 16, 30d supply, fill #1
  Filled 2021-08-02: qty 16, 30d supply, fill #2

## 2020-12-13 ENCOUNTER — Ambulatory Visit: Payer: PRIVATE HEALTH INSURANCE

## 2020-12-14 ENCOUNTER — Other Ambulatory Visit: Payer: Self-pay

## 2020-12-14 ENCOUNTER — Other Ambulatory Visit: Payer: Self-pay | Admitting: Internal Medicine

## 2020-12-14 MED FILL — Tramadol HCl Tab 50 MG: ORAL | 30 days supply | Qty: 240 | Fill #0 | Status: AC

## 2020-12-14 NOTE — Telephone Encounter (Signed)
RX Refill:tramadol Last Seen:10-03-20 Last ordered:06-10-20  

## 2020-12-14 NOTE — Telephone Encounter (Signed)
Refilled

## 2020-12-14 NOTE — Telephone Encounter (Signed)
RX Refill:tramadol Last Seen:10-03-20 Last ordered:06-10-20

## 2020-12-15 ENCOUNTER — Ambulatory Visit: Payer: PRIVATE HEALTH INSURANCE

## 2020-12-15 ENCOUNTER — Other Ambulatory Visit: Payer: Self-pay

## 2020-12-18 ENCOUNTER — Other Ambulatory Visit (INDEPENDENT_AMBULATORY_CARE_PROVIDER_SITE_OTHER): Payer: 59

## 2020-12-18 ENCOUNTER — Other Ambulatory Visit: Payer: Self-pay

## 2020-12-18 DIAGNOSIS — E559 Vitamin D deficiency, unspecified: Secondary | ICD-10-CM | POA: Diagnosis not present

## 2020-12-18 DIAGNOSIS — E669 Obesity, unspecified: Secondary | ICD-10-CM

## 2020-12-18 DIAGNOSIS — D649 Anemia, unspecified: Secondary | ICD-10-CM | POA: Diagnosis not present

## 2020-12-18 DIAGNOSIS — E1169 Type 2 diabetes mellitus with other specified complication: Secondary | ICD-10-CM | POA: Diagnosis not present

## 2020-12-18 LAB — COMPREHENSIVE METABOLIC PANEL
ALT: 13 U/L (ref 0–35)
AST: 16 U/L (ref 0–37)
Albumin: 4.2 g/dL (ref 3.5–5.2)
Alkaline Phosphatase: 91 U/L (ref 39–117)
BUN: 22 mg/dL (ref 6–23)
CO2: 30 mEq/L (ref 19–32)
Calcium: 9.3 mg/dL (ref 8.4–10.5)
Chloride: 102 mEq/L (ref 96–112)
Creatinine, Ser: 1.13 mg/dL (ref 0.40–1.20)
GFR: 52.35 mL/min — ABNORMAL LOW (ref >60.00)
Glucose, Bld: 135 mg/dL — ABNORMAL HIGH (ref 70–99)
Potassium: 4.1 mEq/L (ref 3.5–5.1)
Sodium: 141 mEq/L (ref 135–145)
Total Bilirubin: 0.6 mg/dL (ref 0.2–1.2)
Total Protein: 6.7 g/dL (ref 6.0–8.3)

## 2020-12-18 LAB — CBC WITH DIFFERENTIAL/PLATELET
Basophils Absolute: 0 10*3/uL (ref 0.0–0.1)
Basophils Relative: 0.8 % (ref 0.0–3.0)
Eosinophils Absolute: 0.2 10*3/uL (ref 0.0–0.7)
Eosinophils Relative: 2.9 % (ref 0.0–5.0)
HCT: 37.2 % (ref 36.0–46.0)
Hemoglobin: 12.1 g/dL (ref 12.0–15.0)
Lymphocytes Relative: 35 % (ref 12.0–46.0)
Lymphs Abs: 2.1 10*3/uL (ref 0.7–4.0)
MCHC: 32.7 g/dL (ref 30.0–36.0)
MCV: 85.3 fl (ref 78.0–100.0)
Monocytes Absolute: 0.5 10*3/uL (ref 0.1–1.0)
Monocytes Relative: 8.9 % (ref 3.0–12.0)
Neutro Abs: 3.2 10*3/uL (ref 1.4–7.7)
Neutrophils Relative %: 52.4 % (ref 43.0–77.0)
Platelets: 255 10*3/uL (ref 150.0–400.0)
RBC: 4.36 Mil/uL (ref 3.87–5.11)
RDW: 13.8 % (ref 11.5–15.5)
WBC: 6.1 10*3/uL (ref 4.0–10.5)

## 2020-12-18 LAB — LIPID PANEL
Cholesterol: 145 mg/dL (ref 0–200)
HDL: 60.3 mg/dL (ref >39.00)
LDL Cholesterol: 68 mg/dL (ref 0–99)
NonHDL: 84.88
Total CHOL/HDL Ratio: 2
Triglycerides: 85 mg/dL (ref 0.0–149.0)
VLDL: 17 mg/dL (ref 0.0–40.0)

## 2020-12-18 LAB — MICROALBUMIN / CREATININE URINE RATIO
Creatinine,U: 138.5 mg/dL
Microalb Creat Ratio: 4.5 mg/g (ref 0.0–30.0)
Microalb, Ur: 6.2 mg/dL — ABNORMAL HIGH (ref 0.0–1.9)

## 2020-12-18 LAB — HEMOGLOBIN A1C: Hgb A1c MFr Bld: 6.3 % (ref 4.6–6.5)

## 2020-12-18 LAB — VITAMIN D 25 HYDROXY (VIT D DEFICIENCY, FRACTURES): VITD: 30.23 ng/mL (ref 30.00–100.00)

## 2020-12-19 ENCOUNTER — Ambulatory Visit
Admission: RE | Admit: 2020-12-19 | Discharge: 2020-12-19 | Disposition: A | Discharge status: Home / Self Care | Payer: 59 | Source: Ambulatory Visit | Attending: Nurse Practitioner | Admitting: Nurse Practitioner

## 2020-12-19 ENCOUNTER — Other Ambulatory Visit: Payer: Self-pay

## 2020-12-19 DIAGNOSIS — I712 Thoracic aortic aneurysm, without rupture, unspecified: Secondary | ICD-10-CM

## 2020-12-19 DIAGNOSIS — E042 Nontoxic multinodular goiter: Secondary | ICD-10-CM | POA: Diagnosis not present

## 2020-12-19 DIAGNOSIS — E041 Nontoxic single thyroid nodule: Secondary | ICD-10-CM | POA: Diagnosis not present

## 2020-12-19 MED ORDER — IOHEXOL 350 MG/ML SOLN
75.0000 mL | Freq: Once | INTRAVENOUS | Status: AC | PRN
  Administered 2020-12-19: 75 mL via INTRAVENOUS

## 2020-12-20 ENCOUNTER — Telehealth: Payer: Self-pay | Admitting: *Deleted

## 2020-12-20 ENCOUNTER — Ambulatory Visit: Payer: PRIVATE HEALTH INSURANCE | Attending: Orthopaedic Surgery

## 2020-12-20 DIAGNOSIS — I712 Thoracic aortic aneurysm, without rupture, unspecified: Secondary | ICD-10-CM

## 2020-12-20 DIAGNOSIS — R2681 Unsteadiness on feet: Secondary | ICD-10-CM | POA: Insufficient documentation

## 2020-12-20 DIAGNOSIS — R278 Other lack of coordination: Secondary | ICD-10-CM | POA: Insufficient documentation

## 2020-12-20 DIAGNOSIS — I773 Arterial fibromuscular dysplasia: Secondary | ICD-10-CM

## 2020-12-20 DIAGNOSIS — R2689 Other abnormalities of gait and mobility: Secondary | ICD-10-CM | POA: Diagnosis present

## 2020-12-20 DIAGNOSIS — I7121 Aneurysm of the ascending aorta, without rupture: Secondary | ICD-10-CM

## 2020-12-20 DIAGNOSIS — Q231 Congenital insufficiency of aortic valve: Secondary | ICD-10-CM

## 2020-12-20 DIAGNOSIS — I77810 Thoracic aortic ectasia: Secondary | ICD-10-CM

## 2020-12-20 DIAGNOSIS — M6281 Muscle weakness (generalized): Secondary | ICD-10-CM | POA: Insufficient documentation

## 2020-12-20 DIAGNOSIS — I350 Nonrheumatic aortic (valve) stenosis: Secondary | ICD-10-CM

## 2020-12-20 DIAGNOSIS — I359 Nonrheumatic aortic valve disorder, unspecified: Secondary | ICD-10-CM

## 2020-12-20 NOTE — Telephone Encounter (Signed)
-----   Message from Sondra Barges, [place] sent at 12/20/2020  9:33 AM EDT ----- Please inform patient Courtney Lopez of the chest showed slight enlargement of the ascending thoracic aorta measuring 5 cm.  Previously, this was measuring 4.8 cm by echo in 08/2019 and measuring 4.9 cm by CTA of the chest in 10/2018.    Incidental findings include stable right thyroid nodules with prior thyroid ultrasound noted.  There was also interval left renal atrophy.  Recommendations: -In the setting of history of bicuspid aortic valve, her ascending thoracic aortic aneurysm is now measuring 5 cm, recommendation is for cardiothoracic surgery eval.  Please refer patient to CVTS for evaluation of ascending thoracic aortic aneurysm measuring 5 cm in the context of a bicuspid aortic valve   -With regards to her thyroid nodule, this has previously been worked up with thyroid ultrasound  -Given left renal atrophy, she can discuss potential renal artery ultrasound with her primary cardiologist at her visit on 8/24

## 2020-12-20 NOTE — Telephone Encounter (Signed)
Reviewed results and recommendations with patient. She inquired if low GFR correlates to the renal atrophy. Discussed with provider and then relayed information to patient. Given that her creatine is normal this is not concerning. We discussed referral for CVTS and referral has been placed. Reviewed providers in that office and she did not have a preference at this time. Advised that they will call to assist with scheduling this appointment. Confirmed upcoming appointment, day and time here in our office with Dr. Mariah Milling. She verbalized understanding of our conversation, agreement with plan, and had no further questions at this time.

## 2020-12-20 NOTE — Therapy (Signed)
[facility] MAIN [facility] SERVICES [address] [place], [place], [address] Phone: [phone]   Fax:  [phone]  Physical Therapy Treatment  Patient Details  Name: Courtney Lopez MRN: [id number] Date of Birth: 1961-02-06 Referring Provider (PT): Dr. Dub Mikes   Encounter Date: 12/20/2020   PT End of Session - 12/20/20 1317     Visit Number 12    Number of Visits 27    Date for PT Re-Evaluation 12/06/20    Authorization Type Worker's comp, 1 eval plus 12 visits    Authorization - Visit Number 7    Authorization - Number of Visits 12    PT Start Time 0804    PT Stop Time 0845    PT Time Calculation (min) 41 min    Equipment Utilized During Treatment Gait belt    Activity Tolerance Patient tolerated treatment well;No increased pain    Behavior During Therapy [facility] for tasks assessed/performed             Past Medical History:  Diagnosis Date   Ankle fracture 12/31/2018   left   Anxiety    Arthritis    neck, back, ankle, knees   Ascending aortic aneurysm (HCC)    a. 04/2016 CTA: 4.6cm Asc TAA; b. 04/2018 CTA: 4.8cm; c. 10/2018 CTA: 4.9x4.8cm; d. 12/2019 Echo: 4.8 cm.   Bicuspid aortic valve    Breast screening, unspecified    Carotid arterial disease (HCC)    a. 01/2020 U/S: 1-39% bilat ICA stenosis. No evidence of FMD.   Cervicalgia    cervical stenosis   Diabetes mellitus without complication (HCC)    Elevated coronary artery calcium score    a. 03/2016 Cardiac CT: Ca2+ = 78 (90th %'ile).   Fibromuscular dysplasia (HCC)    Heart murmur    History of hiatal hernia    Metatarsal fracture 2007   5th, from wearing high heels   Moderate aortic stenosis    a. 12/2019 Echo: EF 60-65%, no rwma, mild LVH, gr1 DD, nl RV size/fxn, triv MR, mild AI, mod AS, mod-sev dil of Asc Ao - 42mm.  AoV reported as tricuspid but prev known to be bicuspid.   Multiple thyroid nodules    a. 10/2019 U/S: R mid thyroid nodule - f/u 5 yrs.    Other symptoms involving abdomen and pelvis(789.9)    Renal artery stenosis (HCC)    a. 12/2019 Renal duplex: Nl RRA. LRA >60% stenosis.   Renal cyst, right    a. 12/2019 Duplex: 5.8 x 5.7 cm R renal cyst in lower pole.   Retinal hemorrhage    Symptomatic menopausal or female climacteric states    Unspecified essential hypertension    Wears contact lenses     Past Surgical History:  Procedure Laterality Date   ABDOMINAL HYSTERECTOMY     ANKLE ARTHROSCOPY WITH RECONSTRUCTION Left 08/24/2020   Procedure: LEFT ANKLE ARTHROSCOPIC DEBRIDEMENT, EXTENSIVE, ARTHOSCROPIC TREATMENT OF TALUS OSTEOCHONDRAL LESION, LATERAL LIGAMENT RECONSTRUCTION;  Surgeon: Terance Hart, MD;  Location: [facility];  Service: Orthopedics;  Laterality: Left;   APPENDECTOMY     done during oophorectomy   CESAREAN SECTION     x 2   ETHMOIDECTOMY Left 03/02/2020   Procedure: ETHMOIDECTOMY;  Surgeon: Vernie Murders, MD;  Location: [facility];  Service: ENT;  Laterality: Left;   gyn surgery     hysterectomy- done for adenomysosis and endometriosis   IMAGE GUIDED SINUS SURGERY N/A 03/02/2020   Procedure: IMAGE  GUIDED SINUS SURGERY;  Surgeon: Vernie MurdersJuengel, Paul, MD;  [facility];  Service: ENT;  Laterality: N/A;  need disk PLACED DISK ON OR CHARGE NURSE DESK 9-17 KP   IMAGE GUIDED SINUS SURGERY Left 04/13/2020   Procedure: IMAGE GUIDED SINUS SURGERY;  Surgeon: Vernie MurdersJuengel, Paul, MD;  [facility];  Service: ENT;  Laterality: Left;  needs stryker disk per Gloriajean DellNadine use same disk as before (October) put on charge nurses desk. DS 04/03/20   MAXILLARY ANTROSTOMY Left 03/02/2020   Procedure: MAXILLARY ANTROSTOMY;  Surgeon: Vernie MurdersJuengel, Paul, MD;  [facility];  Service: ENT;  Laterality: Left;   MAXILLARY ANTROSTOMY Left 04/13/2020   Procedure: MAXILLECTOMY;  Surgeon: Vernie MurdersJuengel, Paul, MD;  [facility];  Service: ENT;  Laterality: Left;   OOPHORECTOMY      unilateral, due to ruptured ovarian cysts   OSTEOTOMY MAXILLARY     due to bite problems   TUBAL LIGATION      There were no vitals filed for this visit.   Subjective Assessment - 12/20/20 0808     Subjective Pt reports pain has been OK in L ankle. She says she has had to be on her feet a lot more latey. Pt repots her L ankle was swollen  after "a lot of walking." She reports the most she's walked recently is about an hour and fifteen minutes without the brace. She reports some soreness after this.    Pertinent History Courtney Lopez is a 60 y.o. female s/p left ankle arthroscope for osteochondral lesions due to left ankle instability.  She initially sustained an injury back in August 2020 where she had a nondisplaced fracture of her fibula.  She healed this uneventfully but then had a reinjury approximately 1 year later with resultant instability despite conservative treatment.  X-rays and MRI scan revealed an osteochondral lesion on the medial talar dome.  Prior x-rays from several years ago demonstrated similar lesion but not as severe. Symptoms are rated as moderate to severe, and have been worsening.  This is significantly impairing activities of daily living.  She is s/p surgery on 08/24/20. Per Order, patient is WBAT in boot. Patient presents to therapy with knee walker. She reports while at work she is able to use the knee scooter for 20% of the time and then the rest of the time she is supposed to be sitting. She did start using a crutch over the weekend. reports some numbness/tingling down LLE into foot/ankle;    Limitations Lifting;Standing;Walking;House hold activities;Other (comment)   work restrictions including use of boot and knee scooter   Currently in Pain? Yes    Pain Location Ankle    Pain Orientation Left    Pain Onset More than a month ago            TREATMENT   Manual Therapy:  Pt supine on plinth, PT provides STM, primarily effleurage to dorsum of L foot  to promote pain-free movement and decrease swelling x 4 minutes.   PT provides PROM stretch into L DF and PF x several reps  PROM LLE digit and great toe extension 10x   PT also provides the following: Scar tissue STM over two incision sites laterally and on dorsum of L ankle, and perpendicular to promote circulation of tissue and to increase mobility. Pt tolerates all STM well with no adverse reaction to treatment x 1 min per scar site. By end of treatment skin continues to appear WNL, no adverse  reaction to tx observed or reported.   PT performed grade II-III AP mobs to left talocrual joint in 30 sec bouts for approx 3 minutes.    TherEx-   LLE ROM: ankle DF 0 deg; ankle PF 42 deg.  Seated towel scrunches 2 min   At support surface- Standing, RLE on foam pad, LLE on floor x 60 sec with lateral weight shifts to progress loading of LLE within pain-free range.  Standing ankle rocker board forward/backward with LLE on rocker board 10x.  Standing ankle rocker board forward/backward with BLEs on rocker board 10x  Standing, LLE on dynadisc - forward/backward, side-to-side, CW/CC 10x for each. Pt reports "feels good" L ankle.   Standing heel raises - 2x10  Standing DF - 10x  Ambulation on treadmill for LE endurance/gait assessment - pt ambulates up to 1.7 mph for self-selected walking speed x 3 min 30 sec. Analysis indicates pt with even stance time and step-length B.  All exercises performed within pain-free range. Pt reports some soreness but no pain with exercises.  PT provides education throughout session in the form of VC/TC/Demo to facilitate movement at target joints and correct muscle activation with all exercises performed. Pt shows good carryover within session after cuing    PT Education - 12/20/20 1316     Education Details exercise technique, body mechanics    Person(s) Educated Patient    Methods Explanation;Demonstration;Verbal cues    Comprehension Verbalized  understanding;Returned demonstration              PT Short Term Goals - 12/06/20 1139       PT SHORT TERM GOAL #1   Title Patient will be adherent to HEP at least 3x a week to improve functional strength and balance for better safety at home.    Baseline 7/22: pt indep with HEP, performs at least 3x/week    Time 4    Period Weeks    Status Achieved    Target Date 11/08/20      PT SHORT TERM GOAL #2   Title Patient will improve LLE ankle AROM: DF: 10 degrees, PF: 60 degrees, IV: 30 degrees, EV: 10 degrees to improve ankle ROM for ADLs including gait and transfers;    Baseline 7/22: PF 50 deg, DF 10 deg., IV and EV approx 15-20 deg.    Time 4    Period Weeks    Status On-going    Target Date 12/29/20               PT Long Term Goals - 12/06/20 1139       PT LONG TERM GOAL #1   Title Patient will increase 10 meter walk test to >1.32m/s as to improve gait speed for better community ambulation and to reduce fall risk.    Baseline 7/22: 1.16 m/s    Time 8    Period Weeks    Status Achieved      PT LONG TERM GOAL #2   Title Patient will be independent with ascend/descend 8 steps using single UE in step over step pattern without LOB.    Baseline 7/22: ascends/descends 8 steps using single UE and recip. pattern, no LOB    Time 8    Period Weeks    Status Achieved      PT LONG TERM GOAL #3   Title Patient will improve LLE ankle strength to grossly 4/5 to indicate improved strength for gait and walking tasks.    Baseline 7/22: DF/PF/IV/EV all  5/5    Time 8    Period Weeks    Status Achieved      PT LONG TERM GOAL #4   Title Patient will tolerate 5 seconds of single leg stance without loss of balance to improve ability to get in and out of shower safely.    Baseline 7/22: deferred    Time 8    Period Weeks    Status New    Target Date 01/31/21      PT LONG TERM GOAL #5   Title Patient will improve FOTO score to >60% to indicate improved functional mobility with  ADLs.    Baseline 7/22: 43    Time 8    Period Weeks    Status On-going    Target Date 01/31/21      PT LONG TERM GOAL #6   Title Patient will report a worst pain of 3/10 on VAS in   left ankle          to improve tolerance with ADLs and reduced symptoms with activities.    Time 8    Period Weeks    Status New    Target Date 01/31/21                   Plan - 12/20/20 1328     Clinical Impression Statement Pt able to progress weightbearing tolerance on LLE with self-reported increase in walking time outside of brace without increased pain. Pt still with some swelling over dorsum of L foot. She tolerated all standing therex well. Treadmill gait analysis indicates equal stance time and step-length of R and L LEs. Balance to be further assessed in future sessions as pt is able. The pt will continue to benefit from further skilled PT to increase LE strength, balance and gait ability in order to return to PLOF.    Personal Factors and Comorbidities Comorbidity 3+;Past/Current Experience;Time since onset of injury/illness/exacerbation    Comorbidities anxiety, arthritis, DMx2, fibromuscular dysplasia, HTN,    Examination-Activity Limitations Caring for Others;Carry;Locomotion Level;Squat;Stairs;Stand;Transfers    Examination-Participation Restrictions Cleaning;Community Activity;Occupation;Shop;Volunteer;Yard Work    Stability/Clinical Decision Making Stable/Uncomplicated    Rehab Potential Good    PT Frequency 2x / week    PT Duration 8 weeks    PT Treatment/Interventions Cryotherapy;Electrical Stimulation;Moist Heat;Ultrasound;DME Instruction;Gait training;Stair training;Functional mobility training;Therapeutic activities;Therapeutic exercise;Balance training;Neuromuscular re-education;Patient/family education;Manual techniques;Scar mobilization;Passive range of motion;Dry needling;Energy conservation;Taping    PT Next Visit Plan advance HEP, continue with ROM/strengthening, continue  POC as previously indicated    PT Home Exercise Plan no changes to current plan this date    Consulted and Agree with Plan of Care Patient             Patient will benefit from skilled therapeutic intervention in order to improve the following deficits and impairments:  Abnormal gait, Decreased balance, Decreased endurance, Decreased mobility, Difficulty walking, Impaired sensation, Decreased range of motion, Decreased activity tolerance, Decreased strength, Increased fascial restricitons, Impaired flexibility, Pain  Visit Diagnosis: Muscle weakness (generalized)  Other abnormalities of gait and mobility  Unsteadiness on feet     Problem List Patient Active Problem List   Diagnosis Date Noted   Ankle injury, sequela 06/07/2020   Personal history of COVID-19 05/28/2020   Diabetic nephropathy associated with type 2 diabetes mellitus (HCC) 10/21/2019   Renal artery stenosis (HCC) 10/27/2018   Hypertensive emergency    Thoracic aortic aneurysm without rupture (HCC)    Chest pain 03/10/2018   Paroxysmal tachycardia (HCC) 08/12/2017  Thyroid nodule 05/15/2017   Ascending aorta dilatation (HCC) 05/27/2016   Bicuspid aortic valve 05/27/2016   Encounter for preventive health examination 10/29/2015   Adjustment disorder with anxious mood 06/13/2014   Diabetes mellitus type 2 in obese (HCC) 03/25/2012   Aortic stenosis    Screening for colon cancer 08/25/2011   Screening for breast cancer 08/25/2011   Overweight 08/25/2011   Hyperlipidemia associated with type 2 diabetes mellitus (HCC) 08/25/2011   Cervical spine disease 06/18/2011   Hypertension 06/18/2011   Temple Pacini PT, DPT 12/20/2020, 1:33 PM  [facility] MAIN [facility] SERVICES [address] [place], [place], [address] Phone: [phone]   Fax:  [phone]  Name: Courtney Lopez MRN: [id number] Date of Birth: [birth year]

## 2020-12-20 NOTE — Addendum Note (Signed)
Addended by: Bryna Colander on: 12/20/2020 02:21 PM   Modules accepted: Orders

## 2020-12-22 ENCOUNTER — Ambulatory Visit: Payer: PRIVATE HEALTH INSURANCE

## 2020-12-22 ENCOUNTER — Other Ambulatory Visit: Payer: Self-pay

## 2020-12-22 DIAGNOSIS — M6281 Muscle weakness (generalized): Secondary | ICD-10-CM | POA: Diagnosis not present

## 2020-12-22 DIAGNOSIS — R2689 Other abnormalities of gait and mobility: Secondary | ICD-10-CM

## 2020-12-22 DIAGNOSIS — R278 Other lack of coordination: Secondary | ICD-10-CM

## 2020-12-22 NOTE — Therapy (Signed)
[name] [address] [place], [place], [address] Phone: [phone]   Fax:  [phone]  Physical Therapy Treatment  Patient Details  Name: Courtney SenterStephanie E Lopez MRN: [id number] Date of Birth: 04/26/1961 Referring Provider (PT): Dr. Dub Mikeshristopher Adair   Encounter Date: 12/22/2020   PT End of Session - 12/22/20 0902     Visit Number 13    Number of Visits 19    Date for PT Re-Evaluation 01/31/21    Authorization Type Worker's comp, 1 eval plus 12 visits; auth for 8 additional visits    Authorization - Visit Number 2    Authorization - Number of Visits 8    PT Start Time 0802    PT Stop Time 0844    PT Time Calculation (min) 42 min    Equipment Utilized During Treatment Gait belt    Activity Tolerance Patient tolerated treatment well;No increased pain    Behavior During Therapy [facility] for tasks assessed/performed             Past Medical History:  Diagnosis Date   Ankle fracture 12/31/2018   left   Anxiety    Arthritis    neck, back, ankle, knees   Ascending aortic aneurysm (HCC)    a. 04/2016 CTA: 4.6cm Asc TAA; b. 04/2018 CTA: 4.8cm; c. 10/2018 CTA: 4.9x4.8cm; d. 12/2019 Echo: 4.8 cm.   Bicuspid aortic valve    Breast screening, unspecified    Carotid arterial disease (HCC)    a. 01/2020 U/S: 1-39% bilat ICA stenosis. No evidence of FMD.   Cervicalgia    cervical stenosis   Diabetes mellitus without complication (HCC)    Elevated coronary artery calcium score    a. 03/2016 Cardiac CT: Ca2+ = 78 (90th %'ile).   Fibromuscular dysplasia (HCC)    Heart murmur    History of hiatal hernia    Metatarsal fracture 2007   5th, from wearing high heels   Moderate aortic stenosis    a. 12/2019 Echo: EF 60-65%, no rwma, mild LVH, gr1 DD, nl RV size/fxn, triv MR, mild AI, mod AS, mod-sev dil of Asc Ao - 48mm.  AoV reported as tricuspid but prev known to be bicuspid.   Multiple thyroid nodules    a. 10/2019 U/S: R mid  thyroid nodule - f/u 5 yrs.   Other symptoms involving abdomen and pelvis(789.9)    Renal artery stenosis (HCC)    a. 12/2019 Renal duplex: Nl RRA. LRA >60% stenosis.   Renal cyst, right    a. 12/2019 Duplex: 5.8 x 5.7 cm R renal cyst in lower pole.   Retinal hemorrhage    Symptomatic menopausal or female climacteric states    Unspecified essential hypertension    Wears contact lenses     Past Surgical History:  Procedure Laterality Date   ABDOMINAL HYSTERECTOMY     ANKLE ARTHROSCOPY WITH RECONSTRUCTION Left 08/24/2020   Procedure: LEFT ANKLE ARTHROSCOPIC DEBRIDEMENT, EXTENSIVE, ARTHOSCROPIC TREATMENT OF TALUS OSTEOCHONDRAL LESION, LATERAL LIGAMENT RECONSTRUCTION;  Surgeon: Terance HartAdair, Christopher R, MD;  Location: [facility];  Service: Orthopedics;  Laterality: Left;   APPENDECTOMY     done during oophorectomy   CESAREAN SECTION     x 2   ETHMOIDECTOMY Left 03/02/2020   Procedure: ETHMOIDECTOMY;  Surgeon: Vernie MurdersJuengel, Paul, MD;  [facility];  Service: ENT;  Laterality: Left;   gyn surgery     hysterectomy- done for adenomysosis and endometriosis   IMAGE GUIDED SINUS SURGERY N/A  03/02/2020   Procedure: IMAGE GUIDED SINUS SURGERY;  Surgeon: Vernie Murders, MD;  Location: [facility];  Service: ENT;  Laterality: N/A;  need disk PLACED DISK ON OR CHARGE NURSE DESK 9-17 KP   IMAGE GUIDED SINUS SURGERY Left 04/13/2020   Procedure: IMAGE GUIDED SINUS SURGERY;  Surgeon: Vernie Murders, MD;  [facility];  Service: ENT;  Laterality: Left;  needs stryker disk per Gloriajean Dell use same disk as before (October) put on charge nurses desk. DS 04/03/20   MAXILLARY ANTROSTOMY Left 03/02/2020   Procedure: MAXILLARY ANTROSTOMY;  Surgeon: Vernie Murders, MD;  Location: [facility];  Service: ENT;  Laterality: Left;   MAXILLARY ANTROSTOMY Left 04/13/2020   Procedure: MAXILLECTOMY;  Surgeon: Vernie Murders, MD;  Location: [facility];  Service: ENT;   Laterality: Left;   OOPHORECTOMY     unilateral, due to ruptured ovarian cysts   OSTEOTOMY MAXILLARY     due to bite problems   TUBAL LIGATION      There were no vitals filed for this visit.   Subjective Assessment - 12/22/20 0801     Subjective Pt reports she did well after last appointment.  Had Dr appointment regarding ascending aortic aneurysm she has been tracking. Pt has another follow-up appointment d/t found progression of aneurysm.  Pt reports no pain currently. She has been doing well ambulating without brace.    Pertinent History Courtney Lopez is a 60 y.o. female s/p left ankle arthroscope for osteochondral lesions due to left ankle instability.  She initially sustained an injury back in August 2020 where she had a nondisplaced fracture of her fibula.  She healed this uneventfully but then had a reinjury approximately 1 year later with resultant instability despite conservative treatment.  X-rays and MRI scan revealed an osteochondral lesion on the medial talar dome.  Prior x-rays from several years ago demonstrated similar lesion but not as severe. Symptoms are rated as moderate to severe, and have been worsening.  This is significantly impairing activities of daily living.  She is s/p surgery on 08/24/20. Per Order, patient is WBAT in boot. Patient presents to therapy with knee walker. She reports while at work she is able to use the knee scooter for 20% of the time and then the rest of the time she is supposed to be sitting. She did start using a crutch over the weekend. reports some numbness/tingling down LLE into foot/ankle;    Limitations Lifting;Standing;Walking;House hold activities;Other (comment)   work restrictions including use of boot and knee scooter   Currently in Pain? No/denies    Pain Onset More than a month ago              TREATMENT   Manual Therapy:   Pt supine on plinth, PT provides STM, primarily effleurage to dorsum of L foot to promote pain-free  movement and decrease swelling x 5 minutes.    PT provides PROM stretch into L DF x several reps   PROM LLE digit and great toe extension x multiple reps, also provided with pt positioned in greater DF   PT also provides the following: Scar tissue STM over two incision sites laterally and on dorsum of L ankle. STM techniques used: cross-friction, along/parallel to scar, circles, and pinching and rolling technique. Purpose of scar STM is to promote circulation of tissue and to increase mobility and to decrease pain. Pt tolerates all STM well with no adverse reaction to treatment x 3-4 min per scar site.  By end of treatment skin continues to appear WNL, no adverse reaction to tx observed or reported.   PT performed grade II-III AP mobs to left talocrual joint in 30 sec bouts for approx 2 minutes.      TherEx-    LLE ROM: ankle DF 0 deg following manual therapy.  Standing gastrocsol stretch for LLE - 2x30 sec  Pt performs seated ABC x 1 round between standing gastroc stretching  Standing with LLE on dynadisc and RLE on floor to promote ankle mobility - forward/backward, side-to-side, CW/CC 10x each  RLE on 6" step parallel stance to LLE on floor with lateral weight-shifts to promote weight-acceptance - 10x  Heel raises - 10x  Standing DF 10x  Holding onto support bar, pt performs mini-squats 10x  Seated BTB L ankle adduction/abduction and PF - 2x15 for each; pt overall rates medium  ROM at end of session: L ankle DF 3 deg.  Neuro Re-education:  SLB on RLE 2x30 sec; pt exhibits good postural stability, no LOB Standing in varying degrees of semi-tandem - 2x30 sec BLEs. Pt exhibits good postural control/stability B    All exercises performed within pain-free range. Pt reports no pain with exercises.   PT provides education throughout session in the form of VC/TC/Demo to facilitate movement at target joints and correct muscle activation with all exercises performed. Pt shows good  carryover within session after cuing     PT Education - 12/22/20 0902     Education Details exercise technique, body mechanics    Person(s) Educated Patient    Methods Explanation;Demonstration;Verbal cues    Comprehension Verbalized understanding;Returned demonstration              PT Short Term Goals - 12/06/20 1139       PT SHORT TERM GOAL #1   Title Patient will be adherent to HEP at least 3x a week to improve functional strength and balance for better safety at home.    Baseline 7/22: pt indep with HEP, performs at least 3x/week    Time 4    Period Weeks    Status Achieved    Target Date 11/08/20      PT SHORT TERM GOAL #2   Title Patient will improve LLE ankle AROM: DF: 10 degrees, PF: 60 degrees, IV: 30 degrees, EV: 10 degrees to improve ankle ROM for ADLs including gait and transfers;    Baseline 7/22: PF 50 deg, DF 10 deg., IV and EV approx 15-20 deg.    Time 4    Period Weeks    Status On-going    Target Date 12/29/20               PT Long Term Goals - 12/06/20 1139       PT LONG TERM GOAL #1   Title Patient will increase 10 meter walk test to >1.75m/s as to improve gait speed for better community ambulation and to reduce fall risk.    Baseline 7/22: 1.16 m/s    Time 8    Period Weeks    Status Achieved      PT LONG TERM GOAL #2   Title Patient will be independent with ascend/descend 8 steps using single UE in step over step pattern without LOB.    Baseline 7/22: ascends/descends 8 steps using single UE and recip. pattern, no LOB    Time 8    Period Weeks    Status Achieved      PT LONG TERM GOAL #  3   Title Patient will improve LLE ankle strength to grossly 4/5 to indicate improved strength for gait and walking tasks.    Baseline 7/22: DF/PF/IV/EV all 5/5    Time 8    Period Weeks    Status Achieved      PT LONG TERM GOAL #4   Title Patient will tolerate 5 seconds of single leg stance without loss of balance to improve ability to get in and  out of shower safely.    Baseline 7/22: deferred    Time 8    Period Weeks    Status New    Target Date 01/31/21      PT LONG TERM GOAL #5   Title Patient will improve FOTO score to >60% to indicate improved functional mobility with ADLs.    Baseline 7/22: 43    Time 8    Period Weeks    Status On-going    Target Date 01/31/21      PT LONG TERM GOAL #6   Title Patient will report a worst pain of 3/10 on VAS in   left ankle          to improve tolerance with ADLs and reduced symptoms with activities.    Time 8    Period Weeks    Status New    Target Date 01/31/21                   Plan - 12/22/20 5364     Clinical Impression Statement Pt continues to be highly motivated to participate in PT. She exhibits increased tolerance for standing therex with no pain with any exercises performed. Pt performed semi-tandem balance exercise on BLEs and SLB on RLE, exhibiting good postural stability with all. The pt is still most limited with LLE DF. Treadmill endurance/gait training discontinued for now while pt is following up with her doctor regarding ascending aortic aneurysm progression. The pt will continue to benefit from further skilled PT to continue to improve LE strength, ROM, balance and gait mechanics in order to return to PLOF.    Personal Factors and Comorbidities Comorbidity 3+;Past/Current Experience;Time since onset of injury/illness/exacerbation    Comorbidities anxiety, arthritis, DMx2, fibromuscular dysplasia, HTN,    Examination-Activity Limitations Caring for Others;Carry;Locomotion Level;Squat;Stairs;Stand;Transfers    Examination-Participation Restrictions Cleaning;Community Activity;Occupation;Shop;Volunteer;Yard Work    Stability/Clinical Decision Making Stable/Uncomplicated    Rehab Potential Good    PT Frequency 2x / week    PT Duration 8 weeks    PT Treatment/Interventions Cryotherapy;Electrical Stimulation;Moist Heat;Ultrasound;DME Instruction;Gait  training;Stair training;Functional mobility training;Therapeutic activities;Therapeutic exercise;Balance training;Neuromuscular re-education;Patient/family education;Manual techniques;Scar mobilization;Passive range of motion;Dry needling;Energy conservation;Taping    PT Next Visit Plan advance HEP, continue with ROM/strengthening, continue POC as previously indicated    PT Home Exercise Plan no changes to current plan this date    Consulted and Agree with Plan of Care Patient             Patient will benefit from skilled therapeutic intervention in order to improve the following deficits and impairments:  Abnormal gait, Decreased balance, Decreased endurance, Decreased mobility, Difficulty walking, Impaired sensation, Decreased range of motion, Decreased activity tolerance, Decreased strength, Increased fascial restricitons, Impaired flexibility, Pain  Visit Diagnosis: Other abnormalities of gait and mobility  Muscle weakness (generalized)  Other lack of coordination     Problem List Patient Active Problem List   Diagnosis Date Noted   Ankle injury, sequela 06/07/2020   Personal history of COVID-19 05/28/2020   Diabetic nephropathy  associated with type 2 diabetes mellitus (HCC) 10/21/2019   Renal artery stenosis (HCC) 10/27/2018   Hypertensive emergency    Thoracic aortic aneurysm without rupture (HCC)    Chest pain 03/10/2018   Paroxysmal tachycardia (HCC) 08/12/2017   Thyroid nodule 05/15/2017   Ascending aorta dilatation (HCC) 05/27/2016   Bicuspid aortic valve 05/27/2016   Encounter for preventive health examination 10/29/2015   Adjustment disorder with anxious mood 06/13/2014   Diabetes mellitus type 2 in obese (HCC) 03/25/2012   Aortic stenosis    Screening for colon cancer 08/25/2011   Screening for breast cancer 08/25/2011   Overweight 08/25/2011   Hyperlipidemia associated with type 2 diabetes mellitus (HCC) 08/25/2011   Cervical spine disease 06/18/2011    Hypertension 06/18/2011   Temple Pacini PT, DPT 12/22/2020, 9:23 AM  [place] [facility] MAIN [facility] SERVICES [address] [place], [place], [address] Phone: [phone]   Fax:  [phone]  Name: DESTYNIE TOOMEY MRN: [id number] Date of Birth: November 28, 1960

## 2020-12-27 ENCOUNTER — Ambulatory Visit: Payer: PRIVATE HEALTH INSURANCE | Attending: Orthopaedic Surgery

## 2020-12-27 DIAGNOSIS — R278 Other lack of coordination: Secondary | ICD-10-CM | POA: Insufficient documentation

## 2020-12-27 DIAGNOSIS — R2689 Other abnormalities of gait and mobility: Secondary | ICD-10-CM | POA: Insufficient documentation

## 2020-12-27 DIAGNOSIS — M6281 Muscle weakness (generalized): Secondary | ICD-10-CM | POA: Insufficient documentation

## 2020-12-27 MED FILL — Losartan Potassium Tab 100 MG: ORAL | 90 days supply | Qty: 90 | Fill #1 | Status: AC

## 2020-12-28 ENCOUNTER — Ambulatory Visit
Admission: RE | Admit: 2020-12-28 | Discharge: 2020-12-28 | Disposition: A | Discharge status: Home / Self Care | Payer: 59 | Source: Ambulatory Visit | Attending: Internal Medicine | Admitting: Internal Medicine

## 2020-12-28 ENCOUNTER — Other Ambulatory Visit: Payer: Self-pay

## 2020-12-28 DIAGNOSIS — Z1231 Encounter for screening mammogram for malignant neoplasm of breast: Secondary | ICD-10-CM | POA: Insufficient documentation

## 2020-12-29 ENCOUNTER — Ambulatory Visit: Payer: PRIVATE HEALTH INSURANCE

## 2020-12-29 DIAGNOSIS — M6281 Muscle weakness (generalized): Secondary | ICD-10-CM | POA: Diagnosis not present

## 2020-12-29 DIAGNOSIS — R278 Other lack of coordination: Secondary | ICD-10-CM | POA: Diagnosis not present

## 2020-12-29 DIAGNOSIS — R2689 Other abnormalities of gait and mobility: Secondary | ICD-10-CM

## 2020-12-29 NOTE — Therapy (Signed)
[facility] MAIN [facility] SERVICES [address] [place], [place], [address] Phone: [phone]   Fax:  [phone]  Physical Therapy Treatment  Patient Details  Name: Courtney Lopez MRN: [id number] Date of Birth: 02/12/1961 Referring Provider (PT): Dr. Dub Mikes   Encounter Date: 12/29/2020   PT End of Session - 12/29/20 0951     Visit Number 15    Number of Visits 19    Date for PT Re-Evaluation 01/31/21    Authorization Type Worker's comp, 1 eval plus 12 visits; auth for 8 additional visits    Authorization Time Period 12/05/20-01/31/21    Authorization - Visit Number 3    Authorization - Number of Visits 8    Progress Note Due on Visit 20    PT Start Time 0802    PT Stop Time 0915    PT Time Calculation (min) 73 min    Activity Tolerance Patient tolerated treatment well;No increased pain    Behavior During Therapy [facility] for tasks assessed/performed             Past Medical History:  Diagnosis Date   Ankle fracture 12/31/2018   left   Anxiety    Arthritis    neck, back, ankle, knees   Ascending aortic aneurysm (HCC)    a. 04/2016 CTA: 4.6cm Asc TAA; b. 04/2018 CTA: 4.8cm; c. 10/2018 CTA: 4.9x4.8cm; d. 12/2019 Echo: 4.8 cm.   Bicuspid aortic valve    Breast screening, unspecified    Carotid arterial disease (HCC)    a. 01/2020 U/S: 1-39% bilat ICA stenosis. No evidence of FMD.   Cervicalgia    cervical stenosis   Diabetes mellitus without complication (HCC)    Elevated coronary artery calcium score    a. 03/2016 Cardiac CT: Ca2+ = 78 (90th %'ile).   Fibromuscular dysplasia (HCC)    Heart murmur    History of hiatal hernia    Metatarsal fracture 2007   5th, from wearing high heels   Moderate aortic stenosis    a. 12/2019 Echo: EF 60-65%, no rwma, mild LVH, gr1 DD, nl RV size/fxn, triv MR, mild AI, mod AS, mod-sev dil of Asc Ao - 29mm.  AoV reported as tricuspid but prev known to be bicuspid.   Multiple thyroid nodules     a. 10/2019 U/S: R mid thyroid nodule - f/u 5 yrs.   Other symptoms involving abdomen and pelvis(789.9)    Renal artery stenosis (HCC)    a. 12/2019 Renal duplex: Nl RRA. LRA >60% stenosis.   Renal cyst, right    a. 12/2019 Duplex: 5.8 x 5.7 cm R renal cyst in lower pole.   Retinal hemorrhage    Symptomatic menopausal or female climacteric states    Unspecified essential hypertension    Wears contact lenses     Past Surgical History:  Procedure Laterality Date   ABDOMINAL HYSTERECTOMY     ANKLE ARTHROSCOPY WITH RECONSTRUCTION Left 08/24/2020   Procedure: LEFT ANKLE ARTHROSCOPIC DEBRIDEMENT, EXTENSIVE, ARTHOSCROPIC TREATMENT OF TALUS OSTEOCHONDRAL LESION, LATERAL LIGAMENT RECONSTRUCTION;  Surgeon: Terance Hart, MD;  Location: [facility];  Service: Orthopedics;  Laterality: Left;   APPENDECTOMY     done during oophorectomy   CESAREAN SECTION     x 2   ETHMOIDECTOMY Left 03/02/2020   Procedure: ETHMOIDECTOMY;  Surgeon: Vernie Murders, MD;  Location: [facility];  Service: ENT;  Laterality: Left;   gyn surgery     hysterectomy- done for adenomysosis and endometriosis  IMAGE GUIDED SINUS SURGERY N/A 03/02/2020   Procedure: IMAGE GUIDED SINUS SURGERY;  Surgeon: Vernie Murders, MD;  Location: [facility];  Service: ENT;  Laterality: N/A;  need disk PLACED DISK ON OR CHARGE NURSE DESK 9-17 KP   IMAGE GUIDED SINUS SURGERY Left 04/13/2020   Procedure: IMAGE GUIDED SINUS SURGERY;  Surgeon: Vernie Murders, MD;  Location: [facility];  Service: ENT;  Laterality: Left;  needs stryker disk per Gloriajean Dell use same disk as before (October) put on charge nurses desk. DS 04/03/20   MAXILLARY ANTROSTOMY Left 03/02/2020   Procedure: MAXILLARY ANTROSTOMY;  Surgeon: Vernie Murders, MD;  Location: [facility];  Service: ENT;  Laterality: Left;   MAXILLARY ANTROSTOMY Left 04/13/2020   Procedure: MAXILLECTOMY;  Surgeon: Vernie Murders, MD;  Location: [facility];  Service: ENT;  Laterality: Left;   OOPHORECTOMY     unilateral, due to ruptured ovarian cysts   OSTEOTOMY MAXILLARY     due to bite problems   TUBAL LIGATION      There were no vitals filed for this visit.   Subjective Assessment - 12/29/20 0809     Subjective Pt doing well today, no medical updates. Pt has seen MD for AAA, valve issue, FU appointment pending. Pt is concerned that her workers comp rep is attempting to limit he remaining therapy visits despite recently being an additional 8 visit allowance.    Pertinent History Courtney Lopez is a 60 y.o. female s/p left ankle arthroscope for osteochondral lesions due to left ankle instability.  She initially sustained an injury back in August 2020 where she had a nondisplaced fracture of her fibula.  She healed this uneventfully but then had a reinjury approximately 1 year later with resultant instability despite conservative treatment.  X-rays and MRI scan revealed an osteochondral lesion on the medial talar dome.  Prior x-rays from several years ago demonstrated similar lesion but not as severe. Symptoms are rated as moderate to severe, and have been worsening.  This is significantly impairing activities of daily living.  She is s/p surgery on 08/24/20. Per Order, patient is WBAT in boot. Patient presents to therapy with knee walker. She reports while at work she is able to use the knee scooter for 20% of the time and then the rest of the time she is supposed to be sitting. She did start using a crutch over the weekend. reports some numbness/tingling down LLE into foot/ankle;    How long can you stand comfortably? not limited    How long can you walk comfortably? 2.5hrs, previously    Patient Stated Goals Be able to tolerate time on feet longer for work-related duties.    Currently in Pain? No/denies                [facility] PT Assessment - 12/29/20 0001       Assessment   Medical Diagnosis s/p left ankle scope    Referring  Provider (PT) Dr. Dub Mikes    Onset Date/Surgical Date 08/24/20   surgical date   Hand Dominance Right    Next MD Visit 11/08/20    Prior Therapy did get outpatient PT for ankle with some improvement, but never fully healed; Reports she couldn't get it strong enough for stability; She was wearing lace up brace 75% of time prior to surgery;      Restrictions   LLE Weight Bearing Weight bearing as tolerated      Posture/Postural Control   Posture/Postural  Control Postural limitations    Postural Limitations --   increased supination in WB dorsiflexion and SLS     PROM   Left Ankle Dorsiflexion 28   10 degrees onb 6/1   Left Ankle Plantar Flexion 40   50 on June 1   Left Ankle Inversion 39   (stiff and painful); 25 degrees on 6/1   Left Ankle Eversion 20   10 degrees on 6/1     Strength   Right Ankle Plantar Flexion 5/5    Right Ankle Inversion --   SIngle Leg heel raise to 26x   Left Ankle Plantar Flexion 3/5   unable to perform 1x SLS PF >15 degrees (3-/5 at eval)     High Level Balance   High Level Balance Activites --   SLS: 14sec Left, unable to fully exit slight forefoot inversion            Left ankle P/ROM dorsiflexion: ~29 degrees    Left ankle dorsiflexion A/ROM weakness (above)    Right ankle Plantarflexion P/ROM (above)  Left ankle Plantarflexion P/ROM deficits (above)   INTERVENTION THIS DATE: -AA/ROM Nustep level 1x2 minutes, then added forefoot towels to increase ankle DF for 3 more minutes (seat 9) -Standing Bilat ankle DF/calf stretch 3x30sec on incline -Left ankle long-axis distraction 3x30sec, pt supine -Objective tests and measures as above  There and HEP education: -Standing Left hallux extension stretch 1x30secH  -Standing all toe extension 10x3secH  -BUE supported wide stance pronated heel raises 1x20, author provides mirror for visual biofeedback and tactile facilitation of pronation on LLE due to supination tendency -Single Leg  eccentric heel raises Left 1x10  -wall leaning bilat ankle dorsiflexion 1x15 (noted lack of A/ROM Left and progressive loss of A/ROM due to weakness)  -quadruped posterior weight shifting ankle plantar flexion stretch 2x30sec (pictures taken for ROM assessment and stiffness)  -BlueTB Ankle eversion strengthening 1x15 in long-sitting -SLS 5x15sec c cues for neutral forefoot version, equal loading pressure across all metatarsal joints              PT Education - 12/29/20 0950     Education Details Extensive HEP education and impact of remaining deficits on functional actiivty    Person(s) Educated Patient    Methods Explanation;Demonstration;Tactile cues;Verbal cues;Handout    Comprehension Verbalized understanding;Returned demonstration;Verbal cues required;Tactile cues required;Need further instruction              PT Short Term Goals - 12/29/20 1004       PT SHORT TERM GOAL #1   Title Patient will be adherent to HEP at least 3x a week to improve functional strength and balance for better safety at home.    Baseline 7/22: pt indep with HEP, performs at least 3x/week    Time 4    Period Weeks    Status Achieved    Target Date 11/08/20      PT SHORT TERM GOAL #2   Title Patient will improve LLE ankle AROM: DF: 10 degrees, PF: 60 degrees, IV: 30 degrees, EV: 10 degrees to improve ankle ROM for ADLs including gait and transfers;    Baseline 7/22: PF 50 deg, DF 10 deg., IV and EV approx 15-20 deg; 12/29/20: Ankle PF at 40 degrees, inversion painful and stiff at 39 degrees.    Time 4    Period Weeks    Status On-going    Target Date 12/29/20  PT Long Term Goals - 12/29/20 1005       PT LONG TERM GOAL #1   Title Patient will increase 10 meter walk test to >1.85m/s as to improve gait speed for better community ambulation and to reduce fall risk.    Baseline 7/22: 1.16 m/s    Time 8    Period Weeks    Status Achieved    Target Date 12/06/20      PT  LONG TERM GOAL #2   Title Patient will be independent with ascend/descend 8 steps using single UE in step over step pattern without LOB.    Baseline 7/22: ascends/descends 8 steps using single UE and recip. pattern, no LOB    Time 8    Period Weeks    Status New    Target Date 12/06/20      PT LONG TERM GOAL #3   Title Patient will improve LLE ankle strength to grossly 4/5 to indicate improved strength for gait and walking tasks.    Baseline 7/22: DF/PF/IV/EV all 5/5 manually tested; 12/29/20: weight bearing assessment of strength revealing of significant impairment compared to contrlateral limb (improved overall); stnadardized grading of plantarflexon strength indicates weightbearing testing as a standard, which is 3/5 this date.    Time 8    Period Weeks    Status On-going    Target Date 12/06/20      PT LONG TERM GOAL #4   Title Patient will tolerate 5 seconds of single leg stance without loss of balance to improve ability to get in and out of shower safely.    Baseline 7/22: deferred; 8/419: >5sec    Time 8    Period Weeks    Status Achieved    Target Date 01/31/21      PT LONG TERM GOAL #5   Title Patient will improve FOTO score to >60% to indicate improved functional mobility with ADLs.    Baseline 7/22: 43    Time 8    Period Weeks    Status On-going    Target Date 01/31/21      PT LONG TERM GOAL #6   Title Patient will report a worst pain of 3/10 on VAS in   left ankle to improve tolerance with ADLs and reduced symptoms with activities.    Time 8    Period Weeks    Status On-going    Target Date 01/31/21                   Plan - 12/29/20 0953     Clinical Impression Statement Reassessment this date of strength, mobility, and motor control in LLE. Pt has made good progress in ankle dorsiflexion ROM, but remains limited in full plantarflexion ROM by ~15-20 degrees; pt has full range ankle inversion and eversion but pain and stiffness at end range inversion. Pt  continues to have severe motor weakness about the Left ankle/foot, unable to complete a single LLE heel raise with full available ROM, despite 26x on contrlateral limb. Ankle eversion strength (peroneals) also significantly limited, pt with heavy supinated deviation when transtiioning to flat foot to toe-off, a motor pattern she has difficulty to correct despite heavy cues, largely due to weakness. This supination tendency and weakness in everters is a significant risk factor for future inversion related injury events and warrants additional skilled rehab to improve motor control, strength, and postural correction capacity. Pt continues to progress in actiivty tolerance overall, but is still limited in her time tolerance  on feet which remains much different than her pre-injury status. Pt tolerates session well this date, no frank pain limitations, swelling appears controlled at present, and pt is now in a higher level knee-high compression stocking when at work. Pt educated on updates for HEP at home, does well with multimodal cues and handout. Pt will continue to benefit from skilled PT services to address remaining deficits in strength, joint mobility, motor control, and activity tolerance in order to restore to PLOF in work tolerance, reduce risk of falls and subsequent ankle inversion injury, and allow for full return to PLOF of safety, independence, and tolerance in IADL an leisure activity.    Personal Factors and Comorbidities Comorbidity 3+;Past/Current Experience;Time since onset of injury/illness/exacerbation    Comorbidities anxiety, arthritis, DMx2, fibromuscular dysplasia, HTN, AAA    Examination-Activity Limitations Caring for Others;Carry;Locomotion Level;Squat;Stairs;Stand;Transfers    Examination-Participation Restrictions Cleaning;Community Activity;Occupation;Shop;Volunteer;Yard Work    Stability/Clinical Decision Making Stable/Uncomplicated    Clinical Decision Making Low    Rehab  Potential Good    PT Frequency 2x / week    PT Duration 8 weeks    PT Treatment/Interventions Cryotherapy;Electrical Stimulation;Moist Heat;Ultrasound;DME Instruction;Gait training;Stair training;Functional mobility training;Therapeutic activities;Therapeutic exercise;Balance training;Neuromuscular re-education;Patient/family education;Manual techniques;Scar mobilization;Passive range of motion;Dry needling;Energy conservation;Taping    PT Next Visit Plan Review recent HEP updates as needed, move to single leg eccentric calf strength, peroneal strengthening, eversion control in weightbearing, and ankle PF ROM.    PT Home Exercise Plan Updated on 12/29/20    Consulted and Agree with Plan of Care Patient             Patient will benefit from skilled therapeutic intervention in order to improve the following deficits and impairments:  Abnormal gait, Decreased balance, Decreased endurance, Decreased mobility, Difficulty walking, Impaired sensation, Decreased range of motion, Decreased activity tolerance, Decreased strength, Increased fascial restricitons, Impaired flexibility, Pain  Visit Diagnosis: Other abnormalities of gait and mobility  Muscle weakness (generalized)  Other lack of coordination     Problem List Patient Active Problem List   Diagnosis Date Noted   Ankle injury, sequela 06/07/2020   Personal history of COVID-19 05/28/2020   Diabetic nephropathy associated with type 2 diabetes mellitus (HCC) 10/21/2019   Renal artery stenosis (HCC) 10/27/2018   Hypertensive emergency    Thoracic aortic aneurysm without rupture (HCC)    Chest pain 03/10/2018   Paroxysmal tachycardia (HCC) 08/12/2017   Thyroid nodule 05/15/2017   Ascending aorta dilatation (HCC) 05/27/2016   Bicuspid aortic valve 05/27/2016   Encounter for preventive health examination 10/29/2015   Adjustment disorder with anxious mood 06/13/2014   Diabetes mellitus type 2 in obese (HCC) 03/25/2012   Aortic  stenosis    Screening for colon cancer 08/25/2011   Screening for breast cancer 08/25/2011   Overweight 08/25/2011   Hyperlipidemia associated with type 2 diabetes mellitus (HCC) 08/25/2011   Cervical spine disease 06/18/2011   Hypertension 06/18/2011   11:28 AM, 12/29/20 Rosamaria Lints, PT, DPT Physical Therapist - [facility]  Outpatient Physical Therapy- Main Campus [phone]     [place] 12/29/2020, 10:11 AM  [facility] MAIN [facility] SERVICES [address] [place], [place], [address] Phone: [phone]   Fax:  [phone]  Name: SHONTIA GILLOOLY MRN: [id number] Date of Birth: 1960-05-18

## 2021-01-01 ENCOUNTER — Institutional Professional Consult (permissible substitution) (INDEPENDENT_AMBULATORY_CARE_PROVIDER_SITE_OTHER): Payer: 59 | Admitting: Surgery

## 2021-01-01 ENCOUNTER — Other Ambulatory Visit: Payer: Self-pay

## 2021-01-01 ENCOUNTER — Other Ambulatory Visit: Payer: Self-pay | Admitting: Surgery

## 2021-01-01 ENCOUNTER — Encounter: Payer: Self-pay | Admitting: Surgery

## 2021-01-01 VITALS — BP 177/99 | HR 94 | Resp 20 | Ht 66.5 in | Wt 182.0 lb

## 2021-01-01 DIAGNOSIS — I712 Thoracic aortic aneurysm, without rupture, unspecified: Secondary | ICD-10-CM

## 2021-01-01 NOTE — Progress Notes (Signed)
Cardiothoracic Surgery  Consultation  PCP is [name], Teresa L, MD Referring Provider is [name], Teresa L, MD  Chief Complaint  Patient presents with   Thoracic Aortic Aneurysm    Initial surgical consult, CTA chest 8/9    HPI:  The patient is a 60 year old woman with a history of hypertension, diabetes, renal artery stenosis felt to be secondary to fibromuscular dysplasia, bicuspid aortic valve disease, and an ascending aortic aneurysm that has been followed with periodic echocardiogram and CTA chest.  Her last echocardiogram in August 2021 was read as showing a trileaflet aortic valve with moderate aortic stenosis with a mean gradient of 22.5 mmHg.  Aortic valve area by VTI was 1.09 cm with a dimensionless index of 0.32.  Left ventricular ejection fraction 60 to 65% with mild LVH and grade 1 diastolic dysfunction.  Her echo is performed laparoscopically showed bicuspid aortic valve with a mean gradient of 11 mm likely in 2018.  Her most recent CTA of the chest on 12/19/2020 showed a stable fusiform ascending aortic aneurysm with maximum diameter of 5 cm.  The previous scan in 2020 showed the diameter to be 4.9 cm.  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  She is married and lives with her husband.  She reports that over the past year she has developed exertional fatigue and shortness of breath.  She has found that after she works 3 days in the low that she is completely exhausted and has to spend the fourth day laying around not doing anything.  She has had some dizziness but no syncope.  She has had lower extremity edema.  She denies PND or orthopnea.  She denies any chest pain or pressure.  Past Medical History:  Diagnosis Date   Ankle fracture 12/31/2018   left   Anxiety    Arthritis    neck, back, ankle, knees   Ascending aortic aneurysm (HCC)    a. 04/2016 CTA: 4.6cm Asc TAA; b. 04/2018 CTA: 4.8cm; c. 10/2018 CTA: 4.9x4.8cm; d. 12/2019 Echo: 4.8 cm.    Bicuspid aortic valve    Breast screening, unspecified    Carotid arterial disease (HCC)    a. 01/2020 U/S: 1-39% bilat ICA stenosis. No evidence of FMD.   Cervicalgia    cervical stenosis   Diabetes mellitus without complication (HCC)    Elevated coronary artery calcium score    a. 03/2016 Cardiac CT: Ca2+ = 78 (90th %'ile).   Fibromuscular dysplasia (HCC)    Heart murmur    History of hiatal hernia    Metatarsal fracture 2007   5th, from wearing high heels   Moderate aortic stenosis    a. 12/2019 Echo: EF 60-65%, no rwma, mild LVH, gr1 DD, nl RV size/fxn, triv MR, mild AI, mod AS, mod-sev dil of Asc Ao - 48mm.  AoV reported as tricuspid but prev known to be bicuspid.   Multiple thyroid nodules    a. 10/2019 U/S: R mid thyroid nodule - f/u 5 yrs.   Other symptoms involving abdomen and pelvis(789.9)    Renal artery stenosis (HCC)    a. 12/2019 Renal duplex: Nl RRA. LRA >60% stenosis.   Renal cyst, right    a. 12/2019 Duplex: 5.8 x 5.7 cm R renal cyst in lower pole.   Retinal hemorrhage    Symptomatic menopausal or female climacteric states    Unspecified essential hypertension    Wears contact lenses     Past Surgical History:  Procedure Laterality Date   ABDOMINAL HYSTERECTOMY     ANKLE ARTHROSCOPY WITH RECONSTRUCTION Left 08/24/2020   Procedure: LEFT ANKLE ARTHROSCOPIC DEBRIDEMENT, EXTENSIVE, ARTHOSCROPIC TREATMENT OF TALUS OSTEOCHONDRAL LESION, LATERAL LIGAMENT RECONSTRUCTION;  Surgeon: Terance Hart, MD;  Location: [facility];  Service: Orthopedics;  Laterality: Left;   APPENDECTOMY     done during oophorectomy   CESAREAN SECTION     x 2   ETHMOIDECTOMY Left 03/02/2020   Procedure: ETHMOIDECTOMY;  Surgeon: Vernie Murders, MD;  Location: [facility];  Service: ENT;  Laterality: Left;   gyn surgery     hysterectomy- done for adenomysosis and endometriosis   IMAGE GUIDED SINUS SURGERY N/A 03/02/2020   Procedure: IMAGE GUIDED SINUS SURGERY;  Surgeon:  Vernie Murders, MD;  Location: [facility];  Service: ENT;  Laterality: N/A;  need disk PLACED DISK ON OR CHARGE NURSE DESK 9-17 KP   IMAGE GUIDED SINUS SURGERY Left 04/13/2020   Procedure: IMAGE GUIDED SINUS SURGERY;  Surgeon: Vernie Murders, MD;  Location: [facility];  Service: ENT;  Laterality: Left;  needs stryker disk per Gloriajean Dell use same disk as before (October) put on charge nurses desk. DS 04/03/20   MAXILLARY ANTROSTOMY Left 03/02/2020   Procedure: MAXILLARY ANTROSTOMY;  Surgeon: Vernie Murders, MD;  Location: [facility];  Service: ENT;  Laterality: Left;   MAXILLARY ANTROSTOMY Left 04/13/2020   Procedure: MAXILLECTOMY;  Surgeon: Vernie Murders, MD;  Location: [facility];  Service: ENT;  Laterality: Left;   OOPHORECTOMY     unilateral, due to ruptured ovarian cysts   OSTEOTOMY MAXILLARY     due to bite problems   TUBAL LIGATION      Family History  Problem Relation Age of Onset   Diabetes Mother    Aortic stenosis Mother    Cancer Father        leukemia   Retinoblastoma Son    Melanoma Son    Cancer Maternal Aunt        breast   Breast cancer Maternal Aunt 9   Cancer Grandchild 1       retinoblastoma   Cancer Granddaughter     Social History Social History   Tobacco Use   Smoking status: Never   Smokeless tobacco: Never  Vaping Use   Vaping Use: Never used  Substance Use Topics   Alcohol use: Yes    Comment: rare   Drug use: No    Current Outpatient Medications  Medication Sig Dispense Refill   ALPRAZolam (XANAX) 0.25 MG tablet TAKE 1 TABLET BY MOUTH TWICE DAILY AS NEEDED 60 tablet 5   carvedilol (COREG) 25 MG tablet Take one tablet  by mouth 2 (two) times daily with a meal. 180 tablet 1   estradiol (ESTRACE) 0.1 MG/GM vaginal cream INSERT 1/4 APPLICATORFUL VAGINALLY TWO TIMES A WEEK AS DIRECTED 42.5 g 5   fluticasone (FLONASE) 50 MCG/ACT nasal spray PLACE 2 SPRAYS INTO BOTH NOSTRILS DAILY. 16 g 2   fluticasone (FLONASE) 50  MCG/ACT nasal spray PLACE 2 SPRAYS INTO BOTH NOSTRILS DAILY. 16 g 2   losartan (COZAAR) 100 MG tablet TAKE 1 TABLET (100 MG TOTAL) BY MOUTH DAILY. 90 tablet 3   Menthol, Topical Analgesic, (BIOFREEZE EX) Apply topically.     metFORMIN (GLUCOPHAGE-XR) 750 MG 24 hr tablet Take 1 tablet by mouth daily with breakfast. 90 tablet 0   rosuvastatin (CRESTOR) 5 MG tablet Take 1 tablet (5 mg total) by mouth at bedtime. 90 tablet 2  sodium fluoride (PREVIDENT 5000 PLUS) 1.1 % CREA dental cream Brush teeth thoroughly for 2 minutes, do not rinse afterwards. 51 g 10   traMADol (ULTRAM) 50 MG tablet TAKE 2 TABLETS (100 MG TOTAL) BY MOUTH EVERY 6 (SIX) HOURS AS NEEDED. 240 tablet 5   hydrALAZINE (APRESOLINE) 10 MG tablet Take 1 tablet (10 mg total) by mouth 3 (three) times daily. 270 tablet 3   No current facility-administered medications for this visit.    Allergies  Allergen Reactions   Hctz [Hydrochlorothiazide] Other (See Comments)    Decreased gfr   Prozac [Fluoxetine Hcl] Palpitations    Review of Systems  Constitutional:  Positive for fatigue. Negative for chills and fever.  HENT: Negative.         Last saw her dentist 4 months ago.  Eyes: Negative.   Respiratory:  Positive for shortness of breath.   Cardiovascular:  Positive for leg swelling. Negative for chest pain and palpitations.  Gastrointestinal: Negative.   Endocrine: Negative.   Genitourinary: Negative.   Musculoskeletal:  Positive for arthralgias.  Skin: Negative.   Allergic/Immunologic: Negative.   Neurological:  Positive for dizziness. Negative for syncope.  Hematological: Negative.   Psychiatric/Behavioral:  The patient is nervous/anxious.    BP (!) 177/99 (BP Location: Right Arm, Patient Position: Sitting)   Pulse 94   Resp 20   Ht 5' 6.5" (1.689 m)   Wt 182 lb (82.6 kg)   LMP  (LMP Unknown)   SpO2 97% Comment: RA  BMI 28.94 kg/m  Physical Exam Constitutional:      Appearance: Normal appearance. She is normal  weight.  HENT:     Head: Normocephalic and atraumatic.  Eyes:     Extraocular Movements: Extraocular movements intact.     Conjunctiva/sclera: Conjunctivae normal.     Pupils: Pupils are equal, round, and reactive to light.  Cardiovascular:     Rate and Rhythm: Normal rate and regular rhythm.     Pulses: Normal pulses.     Heart sounds: Murmur heard.     Comments: 3/6 systolic murmur RSB. No diastolic murmur Pulmonary:     Effort: Pulmonary effort is normal.     Breath sounds: Normal breath sounds.  Abdominal:     General: There is no distension.     Tenderness: There is no abdominal tenderness.  Musculoskeletal:        General: Normal range of motion.     Cervical back: Normal range of motion and neck supple.     Comments: Mild bilateral pedal and ankle edema  Skin:    General: Skin is warm and dry.  Neurological:     General: No focal deficit present.     Mental Status: She is alert and oriented to person, place, and time.  Psychiatric:        Mood and Affect: Mood normal.        Behavior: Behavior normal.     Diagnostic Tests:  ECHOCARDIOGRAM REPORT         Patient Name:   CHYREL TAHA Date of Exam: 12/15/2019  Medical Rec #:  [id number]            Height:       66.0 in  Accession #:    [id number]           Weight:       186.0 lb  Date of Birth:  September 04, 1960             BSA:  1.939 m  Patient Age:    58 years             BP:           116/68 mmHg  Patient Gender: F                    HR:           63 bpm.  Exam Location:  [place]   Procedure: 2D Echo, Cardiac Doppler, Color Doppler and Intracardiac             Opacification Agent   Indications:    I35.8 Other nonrheumatic aortic valve disorders     History:        Patient has prior history of Echocardiogram examinations,  most                  recent 01/08/2017. Aortic Valve Disease,  Signs/Symptoms:Murmur                  and Dyspnea; Risk Factors:Non-Smoker. Patient thinks her  dyspnea                   may be progressing but notices that difficulty with blood                  pressure contributes to the dyspnea.     Sonographer:    Quentin Ore RDMS, RVT, RDCS  Referring Phys: [id number] [address] Iba      Sonographer Comments: Very limited acoustic windows  IMPRESSIONS     1. Left ventricular ejection fraction, by estimation, is 60 to 65%. The  left ventricle has normal function. The left ventricle has no regional  wall motion abnormalities. There is mild left ventricular hypertrophy.  Left ventricular diastolic parameters  are consistent with Grade I diastolic dysfunction (impaired relaxation).   2. Right ventricular systolic function is normal. The right ventricular  size is normal. There is normal pulmonary artery systolic pressure.   3. The mitral valve is normal in structure. Trivial mitral valve  regurgitation.   4. The aortic valve is tricuspid. Aortic valve regurgitation is mild.  Moderate aortic valve stenosis. Aortic valve mean gradient measures 22.5  mmHg.   5. Aortic dilatation noted. There is moderate to severe dilatation of the  ascending aorta measuring 48 mm.   6. The inferior vena cava is normal in size with greater than 50%  respiratory variability, suggesting right atrial pressure of 3 mmHg.   FINDINGS   Left Ventricle: Left ventricular ejection fraction, by estimation, is 60  to 65%. The left ventricle has normal function. The left ventricle has no  regional wall motion abnormalities. Definity contrast agent was given IV  to delineate the left ventricular   endocardial borders. The left ventricular internal cavity size was normal  in size. There is mild left ventricular hypertrophy. Left ventricular  diastolic parameters are consistent with Grade I diastolic dysfunction  (impaired relaxation).   Right Ventricle: The right ventricular size is normal. No increase in  right ventricular wall thickness. Right ventricular systolic function is  normal.  There is normal pulmonary artery systolic pressure. The tricuspid  regurgitant velocity is 1.91 m/s, and   with an assumed right atrial pressure of 3 mmHg, the estimated right  ventricular systolic pressure is 17.6 mmHg.   Left Atrium: Left atrial size was normal in size.   Right Atrium: Right atrial size was normal in size.   Pericardium: There is  no evidence of pericardial effusion.   Mitral Valve: The mitral valve is normal in structure. Trivial mitral  valve regurgitation.   Tricuspid Valve: The tricuspid valve is normal in structure. Tricuspid  valve regurgitation is mild.   Aortic Valve: The aortic valve is tricuspid. Aortic valve regurgitation is  mild. Moderate aortic stenosis is present. Aortic valve mean gradient  measures 22.5 mmHg. Aortic valve peak gradient measures 35.9 mmHg. Aortic  valve area, by VTI measures 1.09  cm.   Pulmonic Valve: The pulmonic valve was not well visualized. Pulmonic valve  regurgitation is not visualized.   Aorta: Aortic dilatation noted. There is moderate to severe dilatation of  the ascending aorta measuring 48 mm.   Venous: The inferior vena cava is normal in size with greater than 50%  respiratory variability, suggesting right atrial pressure of 3 mmHg.   IAS/Shunts: No atrial level shunt detected by color flow Doppler.      LEFT VENTRICLE  PLAX 2D  LVIDd:         3.90 cm  Diastology  LVIDs:         2.30 cm  LV e' lateral:   4.57 cm/s  LV PW:         1.10 cm  LV E/e' lateral: 17.5  LV IVS:        1.70 cm  LV e' medial:    4.35 cm/s  LVOT diam:     2.10 cm  LV E/e' medial:  18.4  LV SV:         80  LV SV Index:   41  LVOT Area:     3.46 cm      RIGHT VENTRICLE              IVC  RV Basal diam:  3.60 cm      IVC diam: 1.30 cm  RV S prime:     132.20 cm/s  TAPSE (M-mode): 2.4 cm   LEFT ATRIUM             Index       RIGHT ATRIUM           Index  LA diam:        3.40 cm 1.75 cm/m  RA Area:     13.40 cm  LA Vol (A2C):    55.2 ml 28.47 ml/m RA Volume:   32.60 ml  16.81 ml/m  LA Vol (A4C):   33.9 ml 17.48 ml/m  LA Biplane Vol: 45.5 ml 23.46 ml/m   AORTIC VALVE                    PULMONIC VALVE  AV Area (Vmax):    1.06 cm     PV Vmax:       0.84 m/s  AV Area (Vmean):   1.04 cm     PV Peak grad:  2.8 mmHg  AV Area (VTI):     1.09 cm  AV Vmax:           299.50 cm/s  AV Vmean:          224.500 cm/s  AV VTI:            0.732 m  AV Peak Grad:      35.9 mmHg  AV Mean Grad:      22.5 mmHg  LVOT Vmax:         92.00 cm/s  LVOT Vmean:        67.200  cm/s  LVOT VTI:          0.231 m  LVOT/AV VTI ratio: 0.32     AORTA  Ao Root diam: 3.80 cm  Ao Asc diam:  4.63 cm  Ao Arch diam: 2.9 cm   MITRAL VALVE                TRICUSPID VALVE  MV Area (PHT): 1.95 cm     TR Peak grad:   14.6 mmHg  MV Decel Time: 389 msec     TR Vmax:        191.00 cm/s  MV E velocity: 80.10 cm/s  MV A velocity: 113.00 cm/s  SHUNTS  MV E/A ratio:  0.71         Systemic VTI:  0.23 m                              Systemic Diam: 2.10 cm   Debbe Odea MD  Electronically signed by Debbe Odea MD  Signature Date/Time: 12/15/2019/1:46:07 PM         Final     Narrative & Impression  CLINICAL DATA:  Thoracic aortic aneurysm   EXAM: CT ANGIOGRAPHY CHEST WITH CONTRAST   TECHNIQUE: Multidetector CT imaging of the chest was performed using the standard protocol during bolus administration of intravenous contrast. Multiplanar CT image reconstructions and MIPs were obtained to evaluate the vascular anatomy.   CONTRAST:  20mL OMNIPAQUE IOHEXOL 350 MG/ML SOLN   COMPARISON:  10/21/2018   FINDINGS: Cardiovascular: Preferential opacification of the thoracic aorta. Stable fusiform aneurysmal dilatation of the ascending thoracic aorta, maximal diameter 5 cm, previously 4.9 cm. Remainder of the aorta is normal in caliber. Patent 3 vessel arch anatomy. Minor atherosclerotic changes noted.   No mediastinal hemorrhage or  hematoma. Normal heart size. Native coronary atherosclerosis and aortic valve calcifications present. No pericardial effusion. Visualized central pulmonary arteries are normal in caliber and patent. No large central pulmonary embolus.   Mediastinum/Nodes: Right thyroid nodules again noted, please refer to the prior thyroid ultrasound. Trachea central airways are patent. Esophagus nondilated. No hiatal hernia. No bulky adenopathy.   Lungs/Pleura: No focal pneumonia, collapse or consolidation. Negative for interstitial process or edema. Trachea and central airways are patent.   No pleural abnormality, effusion or pneumothorax.   Upper Abdomen: Interval diffuse left renal atrophy, suspect significant left renal ostial vascular disease when compared to 10/21/2018.   No acute upper abdominal finding.   Musculoskeletal: Degenerative changes noted of the spine. No acute osseous finding.   Review of the MIP images confirms the above findings.   IMPRESSION: Stable fusiform aneurysmal dilatation of the ascending thoracic aorta, maximal diameter 5 cm.   No other acute intrathoracic vascular finding.   Stable right thyroid nodules, please refer to the prior thyroid ultrasound.   Interval left renal atrophy compared 2020 compatible with significant left renal ostial vascular disease. Consider catheter renal angiography and intervention.   Aortic aneurysm NOS (ICD10-I71.9).   Aortic Atherosclerosis (ICD10-I70.0).     Electronically Signed   By: Judie Petit.  Shick M.D.   On: 12/20/2020 09:13       Impression:  This 60 year old woman has a history of moderate aortic stenosis by echocardiogram 1 year ago with a bicuspid aortic valve and a 5.0 cm fusiform ascending aortic aneurysm.  She has New York Heart Association class II symptoms of exertional fatigue and shortness of breath consistent with chronic diastolic congestive  heart failure.  I have personally reviewed her 2D echocardiogram  from 1 year ago and her recent CTA of the chest.  Her echocardiogram showed what appeared to be a functionally bicuspid aortic valve with moderate calcification and thickening and restricted leaflet mobility.  The mean gradient was 22.5 mmHg with a valve area of 1.09 cm consistent with moderate aortic stenosis.  Her recent CTA of the chest shows a maximum diameter of the ascending aorta measuring 5.0 cm which is at the surgical threshold for a patient with a bicuspid aortic valve.  Her aortic root diameter is 4.0 cm and sinotubular junction measures 3.0 cm.  The distal ascending aorta before the innominate artery measures 3.5 cm.  Given her progressive symptoms I suspect that she may have severe aortic stenosis.  She will require a repeat echocardiogram to assess this further.  I think it is reasonable to recommend proceeding with aortic valve replacement and replacement of her ascending aortic aneurysm given its size of 5.0 cm in a relatively healthy woman who is having significant symptoms consistent with severe aortic stenosis.  Her aortic root is minimally enlarged and her sinotubular junction appears normal so I think she would probably be a candidate for supra coronary replacement of her ascending aortic aneurysm and aortic valve replacement.  She will require cardiac catheterization to evaluate her coronaries.  I discussed the alternatives of mechanical and bioprosthetic valves.  Given her age I think a bioprosthetic valve is probably the best option.  She is in full agreement with that.  I reviewed her echocardiogram and CTA images with her and answered all of her questions.   Plan:  She has a follow-up appointment with Dr. Mariah Milling in the next week and will discuss this further with him.  She will be scheduled for repeat 2D echocardiogram and cardiac catheterization in the near future.  After these are completed I will see her back in the office to review results with her and answer any further  questions about surgical treatment.  I spent 60 minutes performing this consultation and > 50% of this time was spent face to face counseling and coordinating the care of this patient's symptomatic bicuspid aortic stenosis and ascending aortic aneurysm.   Alleen Borne, MD [facility] [phone]

## 2021-01-02 NOTE — Progress Notes (Signed)
Cardiology Office Note  Date:  01/03/2021   ID:  Courtney Lopez, DOB 1960/09/21, MRN [id number]  PCP:  Sherlene Shams, MD   Chief Complaint  Patient presents with   4 month follow up     Patient c/o shortness of breath and blood pressure fluctuating during the day. Medications reviewed by the patient verbally.     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 Laneta Simmers several days ago  CTA of the chest on 12/19/2020 showed a stable fusiform ascending aortic aneurysm with maximum diameter of 5 cm.  The previous scan in 2020 showed the diameter to be 4.9 cm. -Mention of Interval diffuse left renal atrophy, suspect significant left renal ostial vascular disease when compared to 10/21/2018.  BP elevated, "insane" at times Going up on hydralazine, taking 20 mg 3 times daily Even tried amlodipine for recent spike in blood pressure  Continues to work long hours  EKG personally reviewed by myself on todays visit Normal sinus rhythm rate 69 bpm left axis deviation  CT chest 10/2018,  Unchanged aneurysmal dilatation of the tubular ascending thoracic aorta, measuring 4.9 x 4.8 cm.   CT scan and ultrasound reviewed Cervical spine MRI reviewed no significant carotid stenosis with section it could be seen   Other past medical history reviewed hospital October 21, 2018 for vision changes blood pressure at home  low at 87/46.   asymptomatic at the time and decided to forego taking her evening carvedilol and losartan.  At work, she was busy but feeling fine until around 4 AM when she developed blurriness  in the left eye while working on a computer.  She also had a mild headache, which she experiences intermittently and attributes to chronic neck problems.  She presented to the emergency department for further evaluation was found to be severely hypertensive with a blood pressure of 244/121.  She was given carvedilol 25 mg, HCTZ 12.5 mg, and 1 inch of nitroglycerin paste with some improvement in her blood pressure.    MRI of the brain did not show any acute abnormalities. Chronic left maxillary sinus disease. MRI cervical spine reviewed no significant carotid stenosis  There are solitary bilateral renal arteries with high-grade, approximately 70% stenosis of the proximal left renal artery (series 5, image 112) and apparent atherosclerosis without significant stenosis of the origin of the right renal artery.  may indicate non atherosclerotic etiology such as fibromuscular dysplasia.  Other past medical history reviewed Previously with paroxysmal tachycardia captured on her phone using pulse meter  Echo 12/2016 Dilated aorta mild 4.2 cm. Stable Aortic valve bicuspid also looks stable, mean gradient 11 mmHg Normal cardiac function  Echo December 2017 Dilated a sending aorta 4.6 cm  CT scan December 2018 Ascending aorta 4.7 cm  CT scan January 2018 ascending aorta 4.6 cm  CT scan December 2017 Aorta 4.6 cm   PMH:   has a past medical history of Ankle fracture (12/31/2018), Anxiety, Arthritis, Ascending aortic aneurysm (HCC), Bicuspid aortic valve, Breast screening, unspecified, Carotid arterial disease (HCC), Cervicalgia, Diabetes mellitus without complication (HCC), Elevated coronary artery calcium score,  Fibromuscular dysplasia (HCC), Heart murmur, History of hiatal hernia, Metatarsal fracture (2007), Moderate aortic stenosis, Multiple thyroid nodules, Other symptoms involving abdomen and pelvis(789.9), Renal artery stenosis (HCC), Renal cyst, right, Retinal hemorrhage, Symptomatic  menopausal or female climacteric states, Unspecified essential hypertension, and Wears contact lenses.  PSH:    Past Surgical History:  Procedure Laterality Date   ABDOMINAL HYSTERECTOMY     ANKLE ARTHROSCOPY WITH RECONSTRUCTION Left 08/24/2020   Procedure: LEFT ANKLE ARTHROSCOPIC DEBRIDEMENT, EXTENSIVE, ARTHOSCROPIC TREATMENT OF TALUS OSTEOCHONDRAL LESION, LATERAL LIGAMENT RECONSTRUCTION;  Surgeon: Terance Hart, MD;  Location: [facility];  Service: Orthopedics;  Laterality: Left;   APPENDECTOMY     done during oophorectomy   CESAREAN SECTION     x 2   ETHMOIDECTOMY Left 03/02/2020   Procedure: ETHMOIDECTOMY;  Surgeon: Vernie Murders, MD;  Location: [facility];  Service: ENT;  Laterality: Left;   gyn surgery     hysterectomy- done for adenomysosis and endometriosis   IMAGE GUIDED SINUS SURGERY N/A 03/02/2020   Procedure: IMAGE GUIDED SINUS SURGERY;  Surgeon: Vernie Murders, MD;  Location: [facility];  Service: ENT;  Laterality: N/A;  need disk PLACED DISK ON OR CHARGE NURSE DESK 9-17 KP   IMAGE GUIDED SINUS SURGERY Left 04/13/2020   Procedure: IMAGE GUIDED SINUS SURGERY;  Surgeon: Vernie Murders, MD;  Location: [facility];  Service: ENT;  Laterality: Left;  needs stryker disk per Gloriajean Dell use same disk as before (October) put on charge nurses desk. DS 04/03/20   MAXILLARY ANTROSTOMY Left 03/02/2020   Procedure: MAXILLARY ANTROSTOMY;  Surgeon: Vernie Murders, MD;  Location: [facility];  Service: ENT;  Laterality: Left;   MAXILLARY ANTROSTOMY Left 04/13/2020   Procedure: MAXILLECTOMY;  Surgeon: Vernie Murders, MD;  Location: [facility];  Service: ENT;  Laterality: Left;   OOPHORECTOMY     unilateral, due to ruptured ovarian cysts   OSTEOTOMY MAXILLARY     due to bite problems   TUBAL LIGATION      Current Outpatient Medications  Medication Sig Dispense Refill   ALPRAZolam (XANAX) 0.25 MG tablet TAKE 1 TABLET BY MOUTH TWICE  DAILY AS NEEDED 60 tablet 5   carvedilol (COREG) 25 MG tablet Take one tablet  by mouth 2 (two) times daily with a meal. 180 tablet 1   estradiol (ESTRACE) 0.1 MG/GM vaginal cream INSERT 1/4 APPLICATORFUL VAGINALLY TWO TIMES A WEEK AS DIRECTED 42.5 g 5   fluticasone (FLONASE) 50 MCG/ACT nasal spray PLACE 2 SPRAYS INTO BOTH NOSTRILS DAILY. 16 g 2   fluticasone (FLONASE) 50 MCG/ACT nasal spray PLACE 2 SPRAYS INTO BOTH NOSTRILS DAILY. 16 g 2   hydrALAZINE (APRESOLINE) 10 MG tablet Take 1 tablet (10 mg total) by mouth 3 (three) times daily. 270 tablet 3   losartan (COZAAR) 100 MG tablet TAKE 1 TABLET (100 MG TOTAL) BY MOUTH DAILY. 90 tablet 3   Menthol, Topical Analgesic, (BIOFREEZE EX) Apply topically.     metFORMIN (GLUCOPHAGE-XR) 750 MG 24 hr tablet Take 1 tablet by mouth daily with breakfast. 90 tablet 0   rosuvastatin (CRESTOR) 5 MG tablet Take 1 tablet (5 mg total) by mouth at bedtime. 90 tablet 2   sodium fluoride (PREVIDENT 5000 PLUS) 1.1 % CREA dental cream Brush teeth thoroughly for 2 minutes, do not rinse afterwards. 51 g 10   traMADol (ULTRAM) 50 MG tablet TAKE 2 TABLETS (100 MG TOTAL) BY MOUTH EVERY 6 (SIX) HOURS AS NEEDED. 240 tablet 5   No current  facility-administered medications for this visit.     Allergies:   Hctz [hydrochlorothiazide] and Prozac [fluoxetine hcl]   Social History:  The patient  reports that she has never smoked. She has never used smokeless tobacco. She reports current alcohol use. She reports that she does not use drugs.   Family History:   family history includes Aortic stenosis in her mother; Breast cancer (age of onset: 93) in her maternal aunt; Cancer in her father, granddaughter, and maternal aunt; Cancer (age of onset: 1) in her grandchild; Diabetes in her mother; Melanoma in her son; Retinoblastoma in her son.    Review of Systems: Review of Systems  Constitutional: Negative.   Respiratory: Negative.    Cardiovascular: Negative.   Gastrointestinal:  Negative.   Musculoskeletal: Negative.   Neurological: Negative.   Psychiatric/Behavioral: Negative.    All other systems reviewed and are negative.   PHYSICAL EXAM: VS:  BP 140/82 (BP Location: Left Arm, Patient Position: Sitting, Cuff Size: Normal)   Pulse 69   Ht 5\' 6"  (1.676 m)   Wt 184 lb 2 oz (83.5 kg)   LMP  (LMP Unknown)   SpO2 99%   BMI 29.72 kg/m  , BMI Body mass index is 29.72 kg/m. Constitutional:  oriented to person, place, and time. No distress.  HENT:  Head: Grossly normal Eyes:  no discharge. No scleral icterus.  Neck: No JVD, no carotid bruits  Cardiovascular: Regular rate and rhythm, 2/6 systolic ejection murmur right sternal border Pulmonary/Chest: Clear to auscultation bilaterally, no wheezes or rails Abdominal: Soft.  no distension.  no tenderness.  Musculoskeletal: Normal range of motion Neurological:  normal muscle tone. Coordination normal. No atrophy Skin: Skin warm and dry Psychiatric: normal affect, pleasant  Recent Labs: 06/29/2020: TSH 3.03 12/18/2020: ALT 13; BUN 22; Creatinine, Ser 1.13; Hemoglobin 12.1; Platelets 255.0; Potassium 4.1; Sodium 141    Lipid Panel Lab Results  Component Value Date   CHOL 145 12/18/2020   HDL 60.30 12/18/2020   LDLCALC 68 12/18/2020   TRIG 85.0 12/18/2020      Wt Readings from Last 3 Encounters:  01/03/21 184 lb 2 oz (83.5 kg)  01/01/21 182 lb (82.6 kg)  10/03/20 179 lb (81.2 kg)     ASSESSMENT AND PLAN:  Essential hypertension -  Recommend to increase hydralazine up to 25 mg3 times daily Amlodipine 2.5 mg daily prn as tolerated with leg swelling May need higher doses if blood pressure continues to run high Recommend we treat aggressively  Chest pain, unspecified type - Currently with no symptoms of angina. No further workup at this time. Continue current medication regimen.  Ascending aorta dilatation (HCC) CT scan 2021 showing stable 4.9x 4.8 cm dilation, up to 5 cm in 2022 Followed by Dr.  2023 Repeat echocardiogram pending  Bicuspid aortic valve Bicuspid aortic valve on echocardiogram,  Mean gradient > 20 mm Hg'  repeat echocardiogram pending As we are closer to surgical status, left heart catheterization to be arranged  Renal artery stenosis Concern for atrophy of kidney on left on CT scan with stable disease Repeat renal artery ultrasound ordered F/u with Dr. Laneta Simmers after renal artery ultrasound If left heart catheterization indicated for dilated aorta and bicuspid aortic valve with stenosis, potentially could evaluate renal artery stenosis with angiography at that time  Diabetes mellitus type 2 in obese (HCC) Weight stable, recommend regular walking program  Shortness of breath Mild symptoms,  symptoms possibly exacerbated by aortic valve stenosis    Total encounter time  more than 25 minutes  Greater than 50% was spent in counseling and coordination of care with the patient    Orders Placed This Encounter  Procedures   EKG 12-Lead     Signed, Dossie Arbourim Buren Havey, M.D., Ph.D. 01/03/2021  [facility], [place] [phone]

## 2021-01-03 ENCOUNTER — Encounter: Payer: Self-pay | Admitting: Cardiovascular Disease

## 2021-01-03 ENCOUNTER — Ambulatory Visit (INDEPENDENT_AMBULATORY_CARE_PROVIDER_SITE_OTHER): Payer: 59 | Admitting: Cardiovascular Disease

## 2021-01-03 ENCOUNTER — Ambulatory Visit (HOSPITAL_COMMUNITY): Payer: 59 | Attending: Surgery

## 2021-01-03 ENCOUNTER — Other Ambulatory Visit: Payer: Self-pay

## 2021-01-03 ENCOUNTER — Ambulatory Visit: Payer: PRIVATE HEALTH INSURANCE

## 2021-01-03 VITALS — BP 140/82 | HR 69 | Ht 66.0 in | Wt 184.1 lb

## 2021-01-03 DIAGNOSIS — R931 Abnormal findings on diagnostic imaging of heart and coronary circulation: Secondary | ICD-10-CM | POA: Diagnosis not present

## 2021-01-03 DIAGNOSIS — I10 Essential (primary) hypertension: Secondary | ICD-10-CM | POA: Diagnosis not present

## 2021-01-03 DIAGNOSIS — I701 Atherosclerosis of renal artery: Secondary | ICD-10-CM | POA: Diagnosis not present

## 2021-01-03 DIAGNOSIS — I712 Thoracic aortic aneurysm, without rupture, unspecified: Secondary | ICD-10-CM

## 2021-01-03 DIAGNOSIS — Q231 Congenital insufficiency of aortic valve: Secondary | ICD-10-CM | POA: Diagnosis not present

## 2021-01-03 DIAGNOSIS — I7121 Aneurysm of the ascending aorta, without rupture: Secondary | ICD-10-CM

## 2021-01-03 LAB — ECHOCARDIOGRAM COMPLETE
AR max vel: 0.87 cm2
AV Area VTI: 0.85 cm2
AV Area mean vel: 0.96 cm2
AV Mean grad: 31 mmHg
AV Peak grad: 47.1 mmHg
Ao pk vel: 3.43 m/s
Area-P 1/2: 3.53 cm2
Height: 66 in
P 1/2 time: 393 msec
S' Lateral: 3.1 cm
Weight: 2946 oz

## 2021-01-03 MED ORDER — HYDRALAZINE HCL 25 MG PO TABS
25.0000 mg | ORAL_TABLET | Freq: Three times a day (TID) | ORAL | 3 refills | Status: DC
  Filled 2021-01-03: qty 270, 90d supply, fill #0
  Filled 2021-05-02: qty 270, 90d supply, fill #1
  Filled 2021-08-02: qty 9, 3d supply, fill #2
  Filled 2021-08-02: qty 261, 87d supply, fill #2

## 2021-01-03 MED ORDER — PERFLUTREN LIPID MICROSPHERE
1.0000 mL | INTRAVENOUS | Status: AC | PRN
  Administered 2021-01-03: 2 mL via INTRAVENOUS

## 2021-01-03 NOTE — Patient Instructions (Addendum)
Medication Instructions:  Please increase the hydralazine up to 25 mg three times a day Extra hydralazine 25 mg as needed for pressure >140  If you need a refill on your cardiac medications before your next appointment, please call your pharmacy.   Lab work: No new labs needed  Testing/Procedures: Renal artery ultrasound   Follow-Up: At [facility], you and your health needs are our priority.  As part of our continuing mission to provide you with exceptional heart care, we have created designated Provider Care Teams.  These Care Teams include your primary Cardiologist (physician) and Advanced Practice Providers (APPs -  Physician Assistants and Nurse Practitioners) who all work together to provide you with the care you need, when you need it.  You will need a follow up appointment in 6 months Follow-up with Dr. Kirke Corin after ultrasound   Providers on your designated Care Team:   Nicolasa Ducking, NP Eula Listen, PA-C Marisue Ivan, PA-C Cadence Em[place], [place]  COVID-19 Vaccine Information can be found at: [email or web address] For questions related to vaccine distribution or appointments, please email [email or web address] or call [phone].

## 2021-01-04 ENCOUNTER — Other Ambulatory Visit: Payer: Self-pay

## 2021-01-05 ENCOUNTER — Ambulatory Visit: Payer: PRIVATE HEALTH INSURANCE

## 2021-01-08 ENCOUNTER — Ambulatory Visit: Payer: PRIVATE HEALTH INSURANCE

## 2021-01-10 ENCOUNTER — Other Ambulatory Visit: Payer: Self-pay

## 2021-01-10 ENCOUNTER — Other Ambulatory Visit: Payer: 59

## 2021-01-11 ENCOUNTER — Ambulatory Visit: Payer: 59

## 2021-01-18 MED FILL — Fluticasone Propionate Nasal Susp 50 MCG/ACT: NASAL | 30 days supply | Qty: 16 | Fill #1 | Status: AC

## 2021-01-18 MED FILL — Tramadol HCl Tab 50 MG: ORAL | 30 days supply | Qty: 240 | Fill #1 | Status: AC

## 2021-01-19 ENCOUNTER — Other Ambulatory Visit: Payer: Self-pay

## 2021-01-23 ENCOUNTER — Encounter (HOSPITAL_COMMUNITY): Payer: Self-pay | Admitting: Radiology

## 2021-01-31 ENCOUNTER — Other Ambulatory Visit: Payer: Self-pay | Admitting: Cardiovascular Disease

## 2021-01-31 DIAGNOSIS — I701 Atherosclerosis of renal artery: Secondary | ICD-10-CM

## 2021-02-05 ENCOUNTER — Other Ambulatory Visit: Payer: Self-pay

## 2021-02-09 ENCOUNTER — Encounter: Payer: Self-pay | Admitting: Physician Assistant

## 2021-02-09 ENCOUNTER — Other Ambulatory Visit: Payer: Self-pay

## 2021-02-09 ENCOUNTER — Telehealth: Payer: 59 | Admitting: Physician Assistant

## 2021-02-09 DIAGNOSIS — J018 Other acute sinusitis: Secondary | ICD-10-CM | POA: Diagnosis not present

## 2021-02-09 DIAGNOSIS — J029 Acute pharyngitis, unspecified: Secondary | ICD-10-CM

## 2021-02-09 MED ORDER — AMOXICILLIN-POT CLAVULANATE 875-125 MG PO TABS
1.0000 | ORAL_TABLET | Freq: Two times a day (BID) | ORAL | 0 refills | Status: DC
  Filled 2021-02-09: qty 14, 7d supply, fill #0

## 2021-02-09 NOTE — Progress Notes (Signed)
E-Visit for Sinus Problems  We are sorry that you are not feeling well.  Here is how we plan to help!  Based on what you have shared with me it looks like you have sinusitis.  Sinusitis is inflammation and infection in the sinus cavities of the head.  Based on your presentation I believe you most likely have Acute Bacterial Sinusitis.  This is an infection caused by bacteria and is treated with antibiotics. I have prescribed Augmentin 875mg /125mg  one tablet twice daily with food, for 7 days. You may use an oral decongestant such as Mucinex D or if you have glaucoma or high blood pressure use plain Mucinex. Saline nasal spray help and can safely be used as often as needed for congestion.  If you develop worsening sinus pain, fever or notice severe headache and vision changes, or if symptoms are not better after completion of antibiotic, please schedule an appointment with a health care provider.      You sore throat could be due to post nasal drainage  and treating the sinus infection should help sore throat.  Sinus infections are not as easily transmitted as other respiratory infection, however we still recommend that you avoid close contact with loved ones, especially the very young and elderly.  Remember to wash your hands thoroughly throughout the day as this is the number one way to prevent the spread of infection!  Home Care: Only take medications as instructed by your medical team. Complete the entire course of an antibiotic. Do not take these medications with alcohol. A steam or ultrasonic humidifier can help congestion.  You can place a towel over your head and breathe in the steam from hot water coming from a faucet. Avoid close contacts especially the very young and the elderly. Cover your mouth when you cough or sneeze. Always remember to wash your hands.  Get Help Right Away If: You develop worsening fever or sinus pain. You develop a severe head ache or visual changes. Your  symptoms persist after you have completed your treatment plan.  Make sure you Understand these instructions. Will watch your condition. Will get help right away if you are not doing well or get worse.  Thank you for choosing an e-visit.  Your e-visit answers were reviewed by a board certified advanced clinical practitioner to complete your personal care plan. Depending upon the condition, your plan could have included both over the counter or prescription medications.  Please review your pharmacy choice. Make sure the pharmacy is open so you can pick up prescription now. If there is a problem, you may contact your provider through and have the prescription routed to another pharmacy.  Your safety is important to [employer]. If you have drug allergies check your prescription carefully.   For the next 24 hours you can use MyChart to ask questions about today's visit, request a non-urgent call back, or ask for a work or school excuse. You will get an email in the next two days asking about your experience. I hope that your e-visit has been valuable and will speed your recovery. I spent 5-10 minutes on review and completion of this note- [facility]

## 2021-02-18 MED FILL — Fluticasone Propionate Nasal Susp 50 MCG/ACT: NASAL | 30 days supply | Qty: 16 | Fill #2 | Status: AC

## 2021-02-19 ENCOUNTER — Other Ambulatory Visit: Payer: Self-pay

## 2021-02-22 ENCOUNTER — Ambulatory Visit: Payer: 59 | Admitting: Cardiovascular Disease

## 2021-02-26 ENCOUNTER — Other Ambulatory Visit: Payer: Self-pay

## 2021-02-26 ENCOUNTER — Other Ambulatory Visit: Payer: Self-pay | Admitting: Internal Medicine

## 2021-02-26 MED FILL — Tramadol HCl Tab 50 MG: ORAL | 30 days supply | Qty: 240 | Fill #2 | Status: AC

## 2021-02-27 ENCOUNTER — Other Ambulatory Visit: Payer: Self-pay

## 2021-02-27 MED ORDER — METFORMIN HCL ER 750 MG PO TB24
ORAL_TABLET | Freq: Every day | ORAL | 0 refills | Status: DC
  Filled 2021-02-27: qty 90, 90d supply, fill #0

## 2021-03-01 ENCOUNTER — Telehealth: Payer: Self-pay | Admitting: Cardiovascular Disease

## 2021-03-01 NOTE — Telephone Encounter (Signed)
Received incoming triage call.  Patient sts that she was awoken from her sleep this morning around 4 am with chest pain. The chest pain has been constant with no relief. Chest pain is located in the center of the chest. She denies n/v, sob, diaphoresis. She does report some congestion, she has tested negative for COVID. Advised the patient that we do not have provider availability today, and that based on her symptoms of constant unrelieved chest pain.I would recommend she go to the ED for evaluation.  Patient sts that Dr. Mariah Milling has told previously advised her that she should she not go to the ED when she has chest pain but that she should call the office. Advised the patient that unfortunately we do not have any opening today's. She has been scheduled to with Dr. Mariah Milling for tomorrow 03/02/21 @ 2:20pm. Advised the patient that Dr. Mariah Milling is not in the office today and is rounding in the hospital. Patient sts that she does not want to sit and wait in the ED. Patient is an Environmental education officer at [employer]. Patient sts that she will locate Dr. Mariah Milling on the floor and discuss her symptoms with him directly.

## 2021-03-01 NOTE — Telephone Encounter (Signed)
Pt c/o of Chest Pain: STAT if CP now or developed within 24 hours  1. Are you having CP right now? Yes started at 4 am   2. Are you experiencing any other symptoms (ex. SOB, nausea, vomiting, sweating)? Congestion not feeling well covid negative   3. How long have you been experiencing CP? Since 4 am   4. Is your CP continuous or coming and going? Constant   5. Have you taken Nitroglycerin? No   Previously Advised not to go to ED but to come to or call the office  ?

## 2021-03-02 ENCOUNTER — Ambulatory Visit: Payer: 59 | Admitting: Cardiovascular Disease

## 2021-03-02 NOTE — Progress Notes (Deleted)
Cardiology Office Note  Date:  03/02/2021   ID:  Courtney Lopez, DOB Sep 17, 1960, MRN [id number]  PCP:  Sherlene Shams, MD   No chief complaint on file.   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 Laneta Simmers several days ago  CTA of the chest on 12/19/2020 showed a stable fusiform ascending aortic aneurysm with maximum diameter of 5 cm.  The previous scan in 2020 showed the diameter to be 4.9 cm. -Mention of Interval diffuse left renal atrophy, suspect significant left renal ostial vascular disease when compared to 10/21/2018.  BP elevated, "insane" at times Going up on hydralazine, taking 20 mg 3 times daily Even tried amlodipine for recent spike in blood pressure  Continues to work long hours  EKG personally reviewed by myself on todays visit Normal sinus rhythm rate 69 bpm left axis deviation  CT chest 10/2018,  Unchanged aneurysmal dilatation of the tubular ascending thoracic aorta, measuring 4.9 x 4.8 cm.   CT scan and ultrasound reviewed Cervical spine MRI reviewed no significant carotid stenosis with section it could be seen   Other past medical history reviewed hospital October 21, 2018 for vision changes blood pressure at home  low at 87/46.   asymptomatic at the time and decided to forego taking her evening carvedilol and losartan.  At work, she was busy but feeling fine until around 4 AM when she developed blurriness in the left eye while working on a computer.  She also had a mild headache, which she experiences intermittently and attributes to chronic neck problems.  She  presented to the emergency department for further evaluation was found to be severely hypertensive with a blood pressure of 244/121.  She was given carvedilol 25 mg, HCTZ 12.5 mg, and 1 inch of nitroglycerin paste with some improvement in her blood pressure.    MRI of the brain did not show any acute abnormalities. Chronic left maxillary sinus disease. MRI cervical spine reviewed no significant carotid stenosis  There are solitary bilateral renal arteries with high-grade, approximately 70% stenosis of the proximal left renal artery (series 5, image 112) and apparent atherosclerosis without significant stenosis of the origin of the right renal artery.  may indicate non atherosclerotic etiology such as fibromuscular dysplasia.  Other past medical history reviewed Previously with paroxysmal tachycardia captured on her phone using pulse meter  Echo 12/2016 Dilated aorta mild 4.2 cm. Stable Aortic valve bicuspid also looks stable, mean gradient 11 mmHg Normal cardiac function  Echo December 2017 Dilated a sending aorta 4.6 cm  CT scan December 2018 Ascending aorta 4.7 cm  CT scan January 2018 ascending aorta 4.6 cm  CT scan December 2017 Aorta 4.6 cm   PMH:   has a past medical history of Ankle fracture (12/31/2018), Anxiety, Arthritis, Ascending aortic aneurysm (HCC), Bicuspid aortic valve, Breast screening, unspecified, Carotid arterial disease (HCC), Cervicalgia, Diabetes mellitus without complication (HCC), Elevated coronary artery calcium score, Fibromuscular dysplasia (HCC), Heart murmur, History of hiatal hernia, Metatarsal fracture (2007), Moderate aortic stenosis, Multiple thyroid nodules, Other symptoms involving abdomen and pelvis(789.9), Renal artery stenosis (HCC), Renal cyst, right,  Retinal hemorrhage, Symptomatic menopausal or female climacteric states, Unspecified essential hypertension, and Wears contact lenses.  PSH:    Past Surgical History:  Procedure Laterality  Date   ABDOMINAL HYSTERECTOMY     ANKLE ARTHROSCOPY WITH RECONSTRUCTION Left 08/24/2020   Procedure: LEFT ANKLE ARTHROSCOPIC DEBRIDEMENT, EXTENSIVE, ARTHOSCROPIC TREATMENT OF TALUS OSTEOCHONDRAL LESION, LATERAL LIGAMENT RECONSTRUCTION;  Surgeon: Terance Hart, MD;  Location: [facility];  Service: Orthopedics;  Laterality: Left;   APPENDECTOMY     done during oophorectomy   CESAREAN SECTION     x 2   ETHMOIDECTOMY Left 03/02/2020   Procedure: ETHMOIDECTOMY;  Surgeon: Vernie Murders, MD;  Location: [facility];  Service: ENT;  Laterality: Left;   gyn surgery     hysterectomy- done for adenomysosis and endometriosis   IMAGE GUIDED SINUS SURGERY N/A 03/02/2020   Procedure: IMAGE GUIDED SINUS SURGERY;  Surgeon: Vernie Murders, MD;  Location: [facility];  Service: ENT;  Laterality: N/A;  need disk PLACED DISK ON OR CHARGE NURSE DESK 9-17 KP   IMAGE GUIDED SINUS SURGERY Left 04/13/2020   Procedure: IMAGE GUIDED SINUS SURGERY;  Surgeon: Vernie Murders, MD;  Location: [facility];  Service: ENT;  Laterality: Left;  needs stryker disk per Gloriajean Dell use same disk as before (October) put on charge nurses desk. DS 04/03/20   MAXILLARY ANTROSTOMY Left 03/02/2020   Procedure: MAXILLARY ANTROSTOMY;  Surgeon: Vernie Murders, MD;  Location: [facility];  Service: ENT;  Laterality: Left;   MAXILLARY ANTROSTOMY Left 04/13/2020   Procedure: MAXILLECTOMY;  Surgeon: Vernie Murders, MD;  Location: [facility];  Service: ENT;  Laterality: Left;   OOPHORECTOMY     unilateral, due to ruptured ovarian cysts   OSTEOTOMY MAXILLARY     due to bite problems   TUBAL LIGATION      Current Outpatient Medications  Medication Sig Dispense Refill   ALPRAZolam (XANAX) 0.25 MG tablet TAKE 1 TABLET BY MOUTH TWICE DAILY AS NEEDED 60 tablet 5   amoxicillin-clavulanate (AUGMENTIN) 875-125 MG tablet Take 1 tablet by mouth 2 (two) times daily. 14 tablet 0   carvedilol (COREG)  25 MG tablet Take one tablet  by mouth 2 (two) times daily with a meal. 180 tablet 1   estradiol (ESTRACE) 0.1 MG/GM vaginal cream INSERT 1/4 APPLICATORFUL VAGINALLY TWO TIMES A WEEK AS DIRECTED 42.5 g 5   fluticasone (FLONASE) 50 MCG/ACT nasal spray PLACE 2 SPRAYS INTO BOTH NOSTRILS DAILY. 16 g 2   fluticasone (FLONASE) 50 MCG/ACT nasal spray PLACE 2 SPRAYS INTO BOTH NOSTRILS DAILY. 16 g 2   hydrALAZINE (APRESOLINE) 25 MG tablet Take 1 tablet (25 mg total) by mouth 3 (three) times daily. 270 tablet 3   losartan (COZAAR) 100 MG tablet TAKE 1 TABLET (100 MG TOTAL) BY MOUTH DAILY. 90 tablet 3   Menthol, Topical Analgesic, (BIOFREEZE EX) Apply topically.     metFORMIN (GLUCOPHAGE-XR) 750 MG 24 hr tablet Take 1 tablet by mouth daily with breakfast. 90 tablet 0   rosuvastatin (CRESTOR) 5 MG tablet Take 1 tablet (5 mg total) by mouth at bedtime. 90 tablet 2   sodium fluoride (PREVIDENT 5000 PLUS) 1.1 % CREA dental cream Brush teeth thoroughly for 2 minutes, do not rinse afterwards. 51 g 10   traMADol (ULTRAM) 50 MG tablet TAKE 2 TABLETS (100 MG TOTAL) BY MOUTH EVERY 6 (SIX) HOURS AS NEEDED. 240 tablet 5   No current facility-administered medications for this visit.     Allergies:  Hctz [hydrochlorothiazide] and Prozac [fluoxetine hcl]   Social History:  The patient  reports that she has never smoked. She has never used smokeless tobacco. She reports current alcohol use. She reports that she does not use drugs.   Family History:   family history includes Aortic stenosis in her mother; Breast cancer (age of onset: 36) in her maternal aunt; Cancer in her father, granddaughter, and maternal aunt; Cancer (age of onset: 1) in her grandchild; Diabetes in her mother; Melanoma in her son; Retinoblastoma in her son.    Review of Systems: Review of Systems  Constitutional: Negative.   Respiratory: Negative.    Cardiovascular: Negative.   Gastrointestinal: Negative.   Musculoskeletal: Negative.    Neurological: Negative.   Psychiatric/Behavioral: Negative.    All other systems reviewed and are negative.   PHYSICAL EXAM: VS:  LMP  (LMP Unknown)  , BMI There is no height or weight on file to calculate BMI. Constitutional:  oriented to person, place, and time. No distress.  HENT:  Head: Grossly normal Eyes:  no discharge. No scleral icterus.  Neck: No JVD, no carotid bruits  Cardiovascular: Regular rate and rhythm, 2/6 systolic ejection murmur right sternal border Pulmonary/Chest: Clear to auscultation bilaterally, no wheezes or rails Abdominal: Soft.  no distension.  no tenderness.  Musculoskeletal: Normal range of motion Neurological:  normal muscle tone. Coordination normal. No atrophy Skin: Skin warm and dry Psychiatric: normal affect, pleasant  Recent Labs: 06/29/2020: TSH 3.03 12/18/2020: ALT 13; BUN 22; Creatinine, Ser 1.13; Hemoglobin 12.1; Platelets 255.0; Potassium 4.1; Sodium 141    Lipid Panel Lab Results  Component Value Date   CHOL 145 12/18/2020   HDL 60.30 12/18/2020   LDLCALC 68 12/18/2020   TRIG 85.0 12/18/2020      Wt Readings from Last 3 Encounters:  01/03/21 184 lb 2 oz (83.5 kg)  01/01/21 182 lb (82.6 kg)  10/03/20 179 lb (81.2 kg)     ASSESSMENT AND PLAN:  Essential hypertension -  Recommend to increase hydralazine up to 25 mg3 times daily Amlodipine 2.5 mg daily prn as tolerated with leg swelling May need higher doses if blood pressure continues to run high Recommend we treat aggressively  Chest pain, unspecified type - Currently with no symptoms of angina. No further workup at this time. Continue current medication regimen.  Ascending aorta dilatation (HCC) CT scan 2021 showing stable 4.9x 4.8 cm dilation, up to 5 cm in 2022 Followed by Dr. Laneta Simmers Repeat echocardiogram pending  Bicuspid aortic valve Bicuspid aortic valve on echocardiogram,  Mean gradient > 20 mm Hg'  repeat echocardiogram pending As we are closer to surgical  status, left heart catheterization to be arranged  Renal artery stenosis Concern for atrophy of kidney on left on CT scan with stable disease Repeat renal artery ultrasound ordered F/u with Dr. Kirke Corin after renal artery ultrasound If left heart catheterization indicated for dilated aorta and bicuspid aortic valve with stenosis, potentially could evaluate renal artery stenosis with angiography at that time  Diabetes mellitus type 2 in obese (HCC) Weight stable, recommend regular walking program  Shortness of breath Mild symptoms,  symptoms possibly exacerbated by aortic valve stenosis    Total encounter time more than 25 minutes  Greater than 50% was spent in counseling and coordination of care with the patient    No orders of the defined types were placed in this encounter.    Signed, Dossie Arbour, M.D., Ph.D. 03/02/2021  [facility] [place], [place] [phone]

## 2021-03-02 NOTE — Telephone Encounter (Signed)
Per Dr. 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  Victorino Dike (with scheduling) reach out to pt to d/c appt, pt agrees to cancel appt as she consulted Dr. Mariah Milling regarding her cardiac concern Nothing further at this time.

## 2021-03-12 ENCOUNTER — Other Ambulatory Visit: Payer: Self-pay

## 2021-03-12 DIAGNOSIS — J301 Allergic rhinitis due to pollen: Secondary | ICD-10-CM | POA: Diagnosis not present

## 2021-03-12 DIAGNOSIS — J320 Chronic maxillary sinusitis: Secondary | ICD-10-CM | POA: Diagnosis not present

## 2021-03-13 ENCOUNTER — Other Ambulatory Visit: Payer: Self-pay

## 2021-03-13 ENCOUNTER — Ambulatory Visit (INDEPENDENT_AMBULATORY_CARE_PROVIDER_SITE_OTHER): Payer: 59

## 2021-03-13 DIAGNOSIS — I701 Atherosclerosis of renal artery: Secondary | ICD-10-CM | POA: Diagnosis not present

## 2021-03-19 ENCOUNTER — Telehealth: Payer: Self-pay

## 2021-03-19 NOTE — Telephone Encounter (Signed)
Able to reach pt regarding her recent Renal US, Dr. Mariah Milling had a chance to review her results and advised   "Renal ultrasound  Stenosis on the left,  consistent with recent CT scan, concern for "Interval left renal atrophy compared 2020"  Has appointment with Dr. Kirke Corin this week, consideration of intervention "  Courtney Lopez very thankful for the phone call of her results, all questions and concerns were address with nothing further at this time. Will see at next schedule f/u appt. With Dr. Kirke Corin this week on 11/11 @ 09:20 am

## 2021-03-23 ENCOUNTER — Ambulatory Visit (INDEPENDENT_AMBULATORY_CARE_PROVIDER_SITE_OTHER): Payer: 59 | Admitting: Cardiovascular Disease

## 2021-03-23 ENCOUNTER — Other Ambulatory Visit: Payer: Self-pay

## 2021-03-23 ENCOUNTER — Encounter: Payer: Self-pay | Admitting: Cardiovascular Disease

## 2021-03-23 VITALS — BP 162/110 | HR 66 | Ht 66.0 in | Wt 189.0 lb

## 2021-03-23 DIAGNOSIS — E669 Obesity, unspecified: Secondary | ICD-10-CM

## 2021-03-23 DIAGNOSIS — I7121 Aneurysm of the ascending aorta, without rupture: Secondary | ICD-10-CM

## 2021-03-23 DIAGNOSIS — I701 Atherosclerosis of renal artery: Secondary | ICD-10-CM | POA: Diagnosis not present

## 2021-03-23 DIAGNOSIS — E1169 Type 2 diabetes mellitus with other specified complication: Secondary | ICD-10-CM

## 2021-03-23 MED ORDER — ASPIRIN EC 81 MG PO TBEC: 81.0000 mg | DELAYED_RELEASE_TABLET | Freq: Every day | ORAL | Status: DC

## 2021-03-23 NOTE — Patient Instructions (Signed)
Medication Instructions:  Your physician has recommended you make the following change in your medication:   START Aspirin 81 mg daily. This can be purchased over the counter.  *If you need a refill on your cardiac medications before your next appointment, please call your pharmacy*   Lab Work: Bmp, Cbc, HgA1c today  If you have labs (blood work) drawn today and your tests are completely normal, you will receive your results only by: MyChart Message (if you have MyChart) OR A paper copy in the mail If you have any lab test that is abnormal or we need to change your treatment, we will call you to review the results.   Testing/Procedures: Dr. Kirke Corin has recommended that your have a Renal Angiography. Please see the pre-procedure instruction below   Follow-Up: At [facility], you and your health needs are our priority.  As part of our continuing mission to provide you with exceptional heart care, we have created designated Provider Care Teams.  These Care Teams include your primary Cardiologist (physician) and Advanced Practice Providers (APPs -  Physician Assistants and Nurse Practitioners) who all work together to provide you with the care you need, when you need it.  We recommend signing up for the patient portal called "MyChart".  Sign up information is provided on this After Visit Summary.  MyChart is used to connect with patients for Virtual Visits (Telemedicine).  Patients are able to view lab/test results, encounter notes, upcoming appointments, etc.  Non-urgent messages can be sent to your provider as well.   To learn more about what you can do with MyChart, go to [email or web address].    Your next appointment:   4 weeks   The format for your next appointment:   In Person  Provider:   You may see Lorine Bears, MD or one of the following Advanced Practice Providers on your designated Care Team:   Nicolasa Ducking, NP Eula Listen, PA-C Cadence Fransico Michael, [place]    Other  Instructions   [facility] CARDIOVASCULAR DIVISION [facility] [address] [place] [address] Dept: [phone] Loc: [phone]  Courtney Lopez  03/23/2021  You are scheduled for a Renal Angiography on Wednesday, November 16 with Dr. Lorine Bears.  1. Please arrive at the [facility] (Main Entrance A) at [facility]: [address] [place], [place] [address] at 12:30 PM (This time is two hours before your procedure to ensure your preparation). Free valet parking service is available.   Special note: Every effort is made to have your procedure done on time. Please understand that emergencies sometimes delay scheduled procedures.  2. Diet: Do not eat solid foods after midnight.  The patient may have clear liquids until 5am upon the day of the procedure.  3. Labs: You will need to have blood drawn today Friday, November 11 at the [place] office  4. Medication instructions in preparation for your procedure:   Contrast Allergy: No    Do not take Diabetes Med Glucophage (Metformin) on the day of the procedure and HOLD 48 HOURS AFTER THE PROCEDURE.  On the morning of your procedure, take your Aspirin and any morning medicines NOT listed above.  You may use sips of water.  5. Plan for one night stay--bring personal belongings. 6. Bring a current list of your medications and current insurance cards. 7. You MUST have a responsible person to drive you home. 8. Someone MUST be with you the first 24 hours after  you arrive home or your discharge will be delayed. 9. Please wear clothes that are easy to get on and off and wear slip-on shoes.  Thank you for allowing [place] to care for you!   -- [facility]

## 2021-03-23 NOTE — H&P (View-Only) (Signed)
Cardiology Office Note   Date:  03/23/2021   ID:  LYNNETTA Lopez, DOB [birth year], MRN [id number]  PCP:  Sherlene Shams, MD  Cardiologist:  Dr. Mariah Milling  Chief Complaint  Patient presents with   Other    F/u renal u/s ; Pt would like to discuss fluctuating BP and SOB/edema legs. Meds reviewed verbally with pt.       History of Present Illness: Courtney Lopez is a 60 y.o. female who is here today for a follow-up visit regarding renal artery stenosis.  She has known history of bicuspid aortic valve with ascending aortic aneurysm, renal artery stenosis, hyperlipidemia and type 2 diabetes. She had CTA of the aorta in 2020 which showed evidence of significant left renal artery stenosis with lack of significant calcifications. This was suggestive of fibromuscular dysplasia. Left renal artery stenosis was confirmed with renal artery duplex. Repeat renal artery duplex in 2021 showed significant stenosis in the mid left renal artery with peak velocity of 439.  Carotid Doppler showed no significant disease.   Blood pressure was initially controlled with medications with stable renal function and thus renal artery angiography was not pursued.  However, over the last 6 months, her blood pressure became uncontrolled in spite of taking her medications regularly.  Amlodipine was associated with leg edema and she stopped taking the medication but she has been using it as needed recently given the difficulty in controlling her blood pressure.  She had recent CT that showed that her left kidney appeared to be much smaller than what it was in 2020.  Repeat renal artery duplex was performed which confirmed left renal artery atrophy with significant stenosis in the left renal artery.  No significant disease on the right side. With uncontrolled hypertension, she has been having increased headaches as well as shortness of breath and leg edema.  Past Medical History:  Diagnosis Date   Ankle fracture  12/31/2018   left   Anxiety    Arthritis    neck, back, ankle, knees   Ascending aortic aneurysm    a. 04/2016 CTA: 4.6cm Asc TAA; b. 04/2018 CTA: 4.8cm; c. 10/2018 CTA: 4.9x4.8cm; d. 12/2019 Echo: 4.8 cm.   Bicuspid aortic valve    Breast screening, unspecified    Carotid arterial disease (HCC)    a. 01/2020 U/S: 1-39% bilat ICA stenosis. No evidence of FMD.   Cervicalgia    cervical stenosis   Diabetes mellitus without complication (HCC)    Elevated coronary artery calcium score    a. 03/2016 Cardiac CT: Ca2+ = 78 (90th %'ile).   Fibromuscular dysplasia (HCC)    Heart murmur    History of hiatal hernia    Metatarsal fracture 2007   5th, from wearing high heels   Moderate aortic stenosis    a. 12/2019 Echo: EF 60-65%, no rwma, mild LVH, gr1 DD, nl RV size/fxn, triv MR, mild AI, mod AS, mod-sev dil of Asc Ao - 66mm.  AoV reported as tricuspid but prev known to be bicuspid.   Multiple thyroid nodules    a. 10/2019 U/S: R mid thyroid nodule - f/u 5 yrs.   Other symptoms involving abdomen and pelvis(789.9)    Renal artery stenosis (HCC)    a. 12/2019 Renal duplex: Nl RRA. LRA >60% stenosis.   Renal cyst, right    a. 12/2019 Duplex: 5.8 x 5.7 cm R renal cyst in lower pole.   Retinal hemorrhage    Symptomatic menopausal or female climacteric states  Unspecified essential hypertension    Wears contact lenses     Past Surgical History:  Procedure Laterality Date   ABDOMINAL HYSTERECTOMY     ANKLE ARTHROSCOPY WITH RECONSTRUCTION Left 08/24/2020   Procedure: LEFT ANKLE ARTHROSCOPIC DEBRIDEMENT, EXTENSIVE, ARTHOSCROPIC TREATMENT OF TALUS OSTEOCHONDRAL LESION, LATERAL LIGAMENT RECONSTRUCTION;  Surgeon: Adair, Christopher R, MD;  Location: [facility];  Service: Orthopedics;  Laterality: Left;   APPENDECTOMY     done during oophorectomy   CESAREAN SECTION     x 2   ETHMOIDECTOMY Left 03/02/2020   Procedure: ETHMOIDECTOMY;  Surgeon: Juengel, Paul, MD;  Location: [facility];  Service: ENT;  Laterality: Left;   gyn surgery     hysterectomy- done for adenomysosis and endometriosis   IMAGE GUIDED SINUS SURGERY N/A 03/02/2020   Procedure: IMAGE GUIDED SINUS SURGERY;  Surgeon: Juengel, Paul, MD;  Location: [facility];  Service: ENT;  Laterality: N/A;  need disk PLACED DISK ON OR CHARGE NURSE DESK 9-17 KP   IMAGE GUIDED SINUS SURGERY Left 04/13/2020   Procedure: IMAGE GUIDED SINUS SURGERY;  Surgeon: Juengel, Paul, MD;  Location: [facility];  Service: ENT;  Laterality: Left;  needs stryker disk per Nadine use same disk as before (October) put on charge nurses desk. DS 04/03/20   MAXILLARY ANTROSTOMY Left 03/02/2020   Procedure: MAXILLARY ANTROSTOMY;  Surgeon: Juengel, Paul, MD;  Location: [facility];  Service: ENT;  Laterality: Left;   MAXILLARY ANTROSTOMY Left 04/13/2020   Procedure: MAXILLECTOMY;  Surgeon: Juengel, Paul, MD;  Location: [facility];  Service: ENT;  Laterality: Left;   OOPHORECTOMY     unilateral, due to ruptured ovarian cysts   OSTEOTOMY MAXILLARY     due to bite problems   TUBAL LIGATION       Current Outpatient Medications  Medication Sig Dispense Refill   ALPRAZolam (XANAX) 0.25 MG tablet TAKE 1 TABLET BY MOUTH TWICE DAILY AS NEEDED 60 tablet 5   carvedilol (COREG) 25 MG tablet Take one tablet  by mouth 2 (two) times daily with a meal. 180 tablet 1   estradiol (ESTRACE) 0.1 MG/GM vaginal cream INSERT 1/4 APPLICATORFUL VAGINALLY TWO TIMES A WEEK AS DIRECTED 42.5 g 5   fluticasone (FLONASE) 50 MCG/ACT nasal spray PLACE 2 SPRAYS INTO BOTH NOSTRILS DAILY. 16 g 2   hydrALAZINE (APRESOLINE) 25 MG tablet Take 1 tablet (25 mg total) by mouth 3 (three) times daily. 270 tablet 3   losartan (COZAAR) 100 MG tablet TAKE 1 TABLET (100 MG TOTAL) BY MOUTH DAILY. 90 tablet 3   Menthol, Topical Analgesic, (BIOFREEZE EX) Apply topically.     metFORMIN (GLUCOPHAGE-XR) 750 MG 24 hr tablet Take 1 tablet by mouth daily  with breakfast. 90 tablet 0   rosuvastatin (CRESTOR) 5 MG tablet Take 1 tablet (5 mg total) by mouth at bedtime. 90 tablet 2   sodium fluoride (PREVIDENT 5000 PLUS) 1.1 % CREA dental cream Brush teeth thoroughly for 2 minutes, do not rinse afterwards. 51 g 10   traMADol (ULTRAM) 50 MG tablet TAKE 2 TABLETS (100 MG TOTAL) BY MOUTH EVERY 6 (SIX) HOURS AS NEEDED. 240 tablet 5   No current facility-administered medications for this visit.    Allergies:   Hctz [hydrochlorothiazide] and Prozac [fluoxetine hcl]    Social History:  The patient  reports that she has never smoked. She has never used smokeless tobacco. She reports current alcohol use. She reports that she does not use drugs.   Family History:    The patient's family history includes Aortic stenosis in her mother; Breast cancer (age of onset: 41) in her maternal aunt; Cancer in her father, granddaughter, and maternal aunt; Cancer (age of onset: 1) in her grandchild; Diabetes in her mother; Melanoma in her son; Retinoblastoma in her son.    ROS:  Please see the history of present illness.   Otherwise, review of systems are positive for none.   All other systems are reviewed and negative.    PHYSICAL EXAM: VS:  BP (!) 162/110 (BP Location: Left Arm, Patient Position: Sitting, Cuff Size: Normal)   Pulse 66   Ht 5\' 6"  (1.676 m)   Wt 189 lb (85.7 kg)   LMP  (LMP Unknown)   SpO2 98%   BMI 30.51 kg/m  , BMI Body mass index is 30.51 kg/m. GEN: Well nourished, well developed, in no acute distress  HEENT: normal  Neck: no JVD, carotid bruits, or masses Cardiac: RRR; no  rubs, or gallops,no edema . 2/ 6 systolic murmur in the aortic area Respiratory:  clear to auscultation bilaterally, normal work of breathing GI: soft, nontender, nondistended, + BS MS: no deformity or atrophy  Skin: warm and dry, no rash Neuro:  Strength and sensation are intact Psych: euthymic mood, full affect   EKG:  EKG is ordered today. The ekg ordered today  demonstrates normal sinus rhythm with left axis deviation.   Recent Labs: 06/29/2020: TSH 3.03 12/18/2020: ALT 13; BUN 22; Creatinine, Ser 1.13; Hemoglobin 12.1; Platelets 255.0; Potassium 4.1; Sodium 141    Lipid Panel    Component Value Date/Time   CHOL 145 12/18/2020 0941   CHOL 175 02/17/2013 0746   TRIG 85.0 12/18/2020 0941   TRIG 100 02/17/2013 0746   HDL 60.30 12/18/2020 0941   HDL 53 02/17/2013 0746   CHOLHDL 2 12/18/2020 0941   VLDL 17.0 12/18/2020 0941   VLDL 20 02/17/2013 0746   LDLCALC 68 12/18/2020 0941   LDLCALC 102 (H) 02/17/2013 0746   LDLDIRECT 101.0 11/11/2016 0906      Wt Readings from Last 3 Encounters:  03/23/21 189 lb (85.7 kg)  01/03/21 184 lb 2 oz (83.5 kg)  01/01/21 182 lb (82.6 kg)       No flowsheet data found.    ASSESSMENT AND PLAN:  1. Renal artery stenosis: Could be due to fibromuscular dysplasia.  However, the stenosis appears to be now subocclusive which is unusual for fibromuscular dysplasia.  In addition, she now has left renal artery atrophy which is concerning for significant ischemic nephropathy.  Her blood pressure is clearly now uncontrolled in spite of at least 3 medications.  Due to that, I recommend proceeding with renal artery angiography and possible angioplasty/stent placement.  I discussed the procedure in details as well as risks and benefits.  Even if there is left kidney atrophy, I think revascularization might still be indicated given the persistently high blood pressure which likely indicates some viability in the left kidney.  If renal artery  revascularization is performed, we will likely observe her overnight to ensure stability of blood pressure postprocedure.  We will try to minimize contrast use given increased risk of contrast-induced nephropathy.  2. Likely bicuspid aortic valve with ascending aortic aneurysm.  Most recent echocardiogram showed moderate aortic stenosis.  3. Ascending aortic aneurysm. Most recently  this measured 48 x 49 mm. Indication for repair if this reaches 50 mm in the presence of bicuspid aortic valve.  4.  Diabetes mellitus: She is due for  hemoglobin A1c which was ordered with the angiogram labs.   Disposition: Proceed with renal artery angiography next week and FU with me in 1 month  Signed,  Lorine Bears, MD  03/23/2021 9:05 AM    [facility]

## 2021-03-23 NOTE — Progress Notes (Signed)
Cardiology Office Note   Date:  03/23/2021   ID:  Courtney Lopez, DOB [birth year], MRN [id number]  PCP:  Sherlene Shams, MD  Cardiologist:  Dr. Mariah Milling  Chief Complaint  Patient presents with   Other    F/u renal u/s ; Pt would like to discuss fluctuating BP and SOB/edema legs. Meds reviewed verbally with pt.       History of Present Illness: Courtney Lopez is a 60 y.o. female who is here today for a follow-up visit regarding renal artery stenosis.  She has known history of bicuspid aortic valve with ascending aortic aneurysm, renal artery stenosis, hyperlipidemia and type 2 diabetes. She had CTA of the aorta in 2020 which showed evidence of significant left renal artery stenosis with lack of significant calcifications. This was suggestive of fibromuscular dysplasia. Left renal artery stenosis was confirmed with renal artery duplex. Repeat renal artery duplex in 2021 showed significant stenosis in the mid left renal artery with peak velocity of 439.  Carotid Doppler showed no significant disease.   Blood pressure was initially controlled with medications with stable renal function and thus renal artery angiography was not pursued.  However, over the last 6 months, her blood pressure became uncontrolled in spite of taking her medications regularly.  Amlodipine was associated with leg edema and she stopped taking the medication but she has been using it as needed recently given the difficulty in controlling her blood pressure.  She had recent CT that showed that her left kidney appeared to be much smaller than what it was in 2020.  Repeat renal artery duplex was performed which confirmed left renal artery atrophy with significant stenosis in the left renal artery.  No significant disease on the right side. With uncontrolled hypertension, she has been having increased headaches as well as shortness of breath and leg edema.  Past Medical History:  Diagnosis Date   Ankle fracture  12/31/2018   left   Anxiety    Arthritis    neck, back, ankle, knees   Ascending aortic aneurysm    a. 04/2016 CTA: 4.6cm Asc TAA; b. 04/2018 CTA: 4.8cm; c. 10/2018 CTA: 4.9x4.8cm; d. 12/2019 Echo: 4.8 cm.   Bicuspid aortic valve    Breast screening, unspecified    Carotid arterial disease (HCC)    a. 01/2020 U/S: 1-39% bilat ICA stenosis. No evidence of FMD.   Cervicalgia    cervical stenosis   Diabetes mellitus without complication (HCC)    Elevated coronary artery calcium score    a. 03/2016 Cardiac CT: Ca2+ = 78 (90th %'ile).   Fibromuscular dysplasia (HCC)    Heart murmur    History of hiatal hernia    Metatarsal fracture 2007   5th, from wearing high heels   Moderate aortic stenosis    a. 12/2019 Echo: EF 60-65%, no rwma, mild LVH, gr1 DD, nl RV size/fxn, triv MR, mild AI, mod AS, mod-sev dil of Asc Ao - 66mm.  AoV reported as tricuspid but prev known to be bicuspid.   Multiple thyroid nodules    a. 10/2019 U/S: R mid thyroid nodule - f/u 5 yrs.   Other symptoms involving abdomen and pelvis(789.9)    Renal artery stenosis (HCC)    a. 12/2019 Renal duplex: Nl RRA. LRA >60% stenosis.   Renal cyst, right    a. 12/2019 Duplex: 5.8 x 5.7 cm R renal cyst in lower pole.   Retinal hemorrhage    Symptomatic menopausal or female climacteric states  Unspecified essential hypertension    Wears contact lenses     Past Surgical History:  Procedure Laterality Date   ABDOMINAL HYSTERECTOMY     ANKLE ARTHROSCOPY WITH RECONSTRUCTION Left 08/24/2020   Procedure: LEFT ANKLE ARTHROSCOPIC DEBRIDEMENT, EXTENSIVE, ARTHOSCROPIC TREATMENT OF TALUS OSTEOCHONDRAL LESION, LATERAL LIGAMENT RECONSTRUCTION;  Surgeon: Terance Hart, MD;  Location: [facility];  Service: Orthopedics;  Laterality: Left;   APPENDECTOMY     done during oophorectomy   CESAREAN SECTION     x 2   ETHMOIDECTOMY Left 03/02/2020   Procedure: ETHMOIDECTOMY;  Surgeon: Vernie Murders, MD;  Location: [facility];  Service: ENT;  Laterality: Left;   gyn surgery     hysterectomy- done for adenomysosis and endometriosis   IMAGE GUIDED SINUS SURGERY N/A 03/02/2020   Procedure: IMAGE GUIDED SINUS SURGERY;  Surgeon: Vernie Murders, MD;  Location: [facility];  Service: ENT;  Laterality: N/A;  need disk PLACED DISK ON OR CHARGE NURSE DESK 9-17 KP   IMAGE GUIDED SINUS SURGERY Left 04/13/2020   Procedure: IMAGE GUIDED SINUS SURGERY;  Surgeon: Vernie Murders, MD;  Location: [facility];  Service: ENT;  Laterality: Left;  needs stryker disk per Gloriajean Dell use same disk as before (October) put on charge nurses desk. DS 04/03/20   MAXILLARY ANTROSTOMY Left 03/02/2020   Procedure: MAXILLARY ANTROSTOMY;  Surgeon: Vernie Murders, MD;  Location: [facility];  Service: ENT;  Laterality: Left;   MAXILLARY ANTROSTOMY Left 04/13/2020   Procedure: MAXILLECTOMY;  Surgeon: Vernie Murders, MD;  Location: [facility];  Service: ENT;  Laterality: Left;   OOPHORECTOMY     unilateral, due to ruptured ovarian cysts   OSTEOTOMY MAXILLARY     due to bite problems   TUBAL LIGATION       Current Outpatient Medications  Medication Sig Dispense Refill   ALPRAZolam (XANAX) 0.25 MG tablet TAKE 1 TABLET BY MOUTH TWICE DAILY AS NEEDED 60 tablet 5   carvedilol (COREG) 25 MG tablet Take one tablet  by mouth 2 (two) times daily with a meal. 180 tablet 1   estradiol (ESTRACE) 0.1 MG/GM vaginal cream INSERT 1/4 APPLICATORFUL VAGINALLY TWO TIMES A WEEK AS DIRECTED 42.5 g 5   fluticasone (FLONASE) 50 MCG/ACT nasal spray PLACE 2 SPRAYS INTO BOTH NOSTRILS DAILY. 16 g 2   hydrALAZINE (APRESOLINE) 25 MG tablet Take 1 tablet (25 mg total) by mouth 3 (three) times daily. 270 tablet 3   losartan (COZAAR) 100 MG tablet TAKE 1 TABLET (100 MG TOTAL) BY MOUTH DAILY. 90 tablet 3   Menthol, Topical Analgesic, (BIOFREEZE EX) Apply topically.     metFORMIN (GLUCOPHAGE-XR) 750 MG 24 hr tablet Take 1 tablet by mouth daily  with breakfast. 90 tablet 0   rosuvastatin (CRESTOR) 5 MG tablet Take 1 tablet (5 mg total) by mouth at bedtime. 90 tablet 2   sodium fluoride (PREVIDENT 5000 PLUS) 1.1 % CREA dental cream Brush teeth thoroughly for 2 minutes, do not rinse afterwards. 51 g 10   traMADol (ULTRAM) 50 MG tablet TAKE 2 TABLETS (100 MG TOTAL) BY MOUTH EVERY 6 (SIX) HOURS AS NEEDED. 240 tablet 5   No current facility-administered medications for this visit.    Allergies:   Hctz [hydrochlorothiazide] and Prozac [fluoxetine hcl]    Social History:  The patient  reports that she has never smoked. She has never used smokeless tobacco. She reports current alcohol use. She reports that she does not use drugs.   Family History:  The patient's family history includes Aortic stenosis in her mother; Breast cancer (age of onset: 41) in her maternal aunt; Cancer in her father, granddaughter, and maternal aunt; Cancer (age of onset: 1) in her grandchild; Diabetes in her mother; Melanoma in her son; Retinoblastoma in her son.    ROS:  Please see the history of present illness.   Otherwise, review of systems are positive for none.   All other systems are reviewed and negative.    PHYSICAL EXAM: VS:  BP (!) 162/110 (BP Location: Left Arm, Patient Position: Sitting, Cuff Size: Normal)   Pulse 66   Ht 5\' 6"  (1.676 m)   Wt 189 lb (85.7 kg)   LMP  (LMP Unknown)   SpO2 98%   BMI 30.51 kg/m  , BMI Body mass index is 30.51 kg/m. GEN: Well nourished, well developed, in no acute distress  HEENT: normal  Neck: no JVD, carotid bruits, or masses Cardiac: RRR; no  rubs, or gallops,no edema . 2/ 6 systolic murmur in the aortic area Respiratory:  clear to auscultation bilaterally, normal work of breathing GI: soft, nontender, nondistended, + BS MS: no deformity or atrophy  Skin: warm and dry, no rash Neuro:  Strength and sensation are intact Psych: euthymic mood, full affect   EKG:  EKG is ordered today. The ekg ordered today  demonstrates normal sinus rhythm with left axis deviation.   Recent Labs: 06/29/2020: TSH 3.03 12/18/2020: ALT 13; BUN 22; Creatinine, Ser 1.13; Hemoglobin 12.1; Platelets 255.0; Potassium 4.1; Sodium 141    Lipid Panel    Component Value Date/Time   CHOL 145 12/18/2020 0941   CHOL 175 02/17/2013 0746   TRIG 85.0 12/18/2020 0941   TRIG 100 02/17/2013 0746   HDL 60.30 12/18/2020 0941   HDL 53 02/17/2013 0746   CHOLHDL 2 12/18/2020 0941   VLDL 17.0 12/18/2020 0941   VLDL 20 02/17/2013 0746   LDLCALC 68 12/18/2020 0941   LDLCALC 102 (H) 02/17/2013 0746   LDLDIRECT 101.0 11/11/2016 0906      Wt Readings from Last 3 Encounters:  03/23/21 189 lb (85.7 kg)  01/03/21 184 lb 2 oz (83.5 kg)  01/01/21 182 lb (82.6 kg)       No flowsheet data found.    ASSESSMENT AND PLAN:  1. Renal artery stenosis: Could be due to fibromuscular dysplasia.  However, the stenosis appears to be now subocclusive which is unusual for fibromuscular dysplasia.  In addition, she now has left renal artery atrophy which is concerning for significant ischemic nephropathy.  Her blood pressure is clearly now uncontrolled in spite of at least 3 medications.  Due to that, I recommend proceeding with renal artery angiography and possible angioplasty/stent placement.  I discussed the procedure in details as well as risks and benefits.  Even if there is left kidney atrophy, I think revascularization might still be indicated given the persistently high blood pressure which likely indicates some viability in the left kidney.  If renal artery  revascularization is performed, we will likely observe her overnight to ensure stability of blood pressure postprocedure.  We will try to minimize contrast use given increased risk of contrast-induced nephropathy.  2. Likely bicuspid aortic valve with ascending aortic aneurysm.  Most recent echocardiogram showed moderate aortic stenosis.  3. Ascending aortic aneurysm. Most recently  this measured 48 x 49 mm. Indication for repair if this reaches 50 mm in the presence of bicuspid aortic valve.  4.  Diabetes mellitus: She is due for  hemoglobin A1c which was ordered with the angiogram labs.   Disposition: Proceed with renal artery angiography next week and FU with me in 1 month  Signed,  Lorine Bears, MD  03/23/2021 9:05 AM    [facility]

## 2021-03-24 LAB — CBC WITH DIFFERENTIAL/PLATELET
Basophils Absolute: 0.1 10*3/uL (ref 0.0–0.2)
Basos: 1 %
EOS (ABSOLUTE): 0.2 10*3/uL (ref 0.0–0.4)
Eos: 3 %
Hematocrit: 38.5 % (ref 34.0–46.6)
Hemoglobin: 12.8 g/dL (ref 11.1–15.9)
Immature Grans (Abs): 0 10*3/uL (ref 0.0–0.1)
Immature Granulocytes: 0 %
Lymphocytes Absolute: 2.3 10*3/uL (ref 0.7–3.1)
Lymphs: 31 %
MCH: 27.4 pg (ref 26.6–33.0)
MCHC: 33.2 g/dL (ref 31.5–35.7)
MCV: 82 fL (ref 79–97)
Monocytes Absolute: 0.7 10*3/uL (ref 0.1–0.9)
Monocytes: 10 %
Neutrophils Absolute: 4.2 10*3/uL (ref 1.4–7.0)
Neutrophils: 55 %
Platelets: 319 10*3/uL (ref 150–450)
RBC: 4.67 x10E6/uL (ref 3.77–5.28)
RDW: 12.7 % (ref 11.7–15.4)
WBC: 7.5 10*3/uL (ref 3.4–10.8)

## 2021-03-24 LAB — BASIC METABOLIC PANEL
BUN/Creatinine Ratio: 18 (ref 12–28)
BUN: 18 mg/dL (ref 8–27)
CO2: 24 mmol/L (ref 20–29)
Calcium: 9.3 mg/dL (ref 8.7–10.3)
Chloride: 103 mmol/L (ref 96–106)
Creatinine, Ser: 1.01 mg/dL — ABNORMAL HIGH (ref 0.57–1.00)
Glucose: 158 mg/dL — ABNORMAL HIGH (ref 70–99)
Potassium: 4.7 mmol/L (ref 3.5–5.2)
Sodium: 144 mmol/L (ref 134–144)
eGFR: 63 mL/min/{1.73_m2} (ref >59)

## 2021-03-24 LAB — HEMOGLOBIN A1C
Est. average glucose Bld gHb Est-mCnc: 143 mg/dL
Hgb A1c MFr Bld: 6.6 % — ABNORMAL HIGH (ref 4.8–5.6)

## 2021-03-27 ENCOUNTER — Other Ambulatory Visit: Payer: Self-pay

## 2021-03-27 ENCOUNTER — Encounter: Payer: Self-pay | Admitting: Internal Medicine

## 2021-03-27 ENCOUNTER — Telehealth: Payer: Self-pay | Admitting: Internal Medicine

## 2021-03-27 ENCOUNTER — Ambulatory Visit: Payer: 59 | Admitting: Internal Medicine

## 2021-03-27 ENCOUNTER — Telehealth: Payer: Self-pay | Admitting: *Deleted

## 2021-03-27 VITALS — BP 168/106 | HR 74 | Temp 96.4°F | Ht 66.0 in | Wt 187.4 lb

## 2021-03-27 DIAGNOSIS — E1121 Type 2 diabetes mellitus with diabetic nephropathy: Secondary | ICD-10-CM | POA: Diagnosis not present

## 2021-03-27 DIAGNOSIS — E663 Overweight: Secondary | ICD-10-CM

## 2021-03-27 DIAGNOSIS — I77810 Thoracic aortic ectasia: Secondary | ICD-10-CM | POA: Diagnosis not present

## 2021-03-27 DIAGNOSIS — E041 Nontoxic single thyroid nodule: Secondary | ICD-10-CM | POA: Diagnosis not present

## 2021-03-27 MED ORDER — ALPRAZOLAM 0.25 MG PO TABS
ORAL_TABLET | Freq: Two times a day (BID) | ORAL | 5 refills | Status: DC | PRN
  Filled 2021-03-27: qty 60, fill #0
  Filled 2021-04-10: qty 60, 30d supply, fill #0
  Filled 2021-05-09: qty 60, 30d supply, fill #1
  Filled 2021-06-08: qty 60, 30d supply, fill #2
  Filled 2021-07-09: qty 60, 30d supply, fill #3
  Filled 2021-08-02: qty 60, 30d supply, fill #4
  Filled 2021-08-31: qty 60, 30d supply, fill #5

## 2021-03-27 MED ORDER — CARVEDILOL 25 MG PO TABS
ORAL_TABLET | Freq: Two times a day (BID) | ORAL | 1 refills | Status: DC
  Filled 2021-03-27: qty 180, fill #0
  Filled 2021-05-02: qty 180, 90d supply, fill #0
  Filled 2021-08-02: qty 180, 90d supply, fill #1

## 2021-03-27 MED ORDER — HYDROCODONE-ACETAMINOPHEN 10-325 MG PO TABS
1.0000 | ORAL_TABLET | Freq: Four times a day (QID) | ORAL | 0 refills | Status: DC | PRN
  Filled 2021-03-27: qty 30, 8d supply, fill #0

## 2021-03-27 MED ORDER — LOSARTAN POTASSIUM 100 MG PO TABS
ORAL_TABLET | Freq: Every day | ORAL | 0 refills | Status: DC
  Filled 2021-03-27: qty 30, 30d supply, fill #0

## 2021-03-27 NOTE — Assessment & Plan Note (Signed)
With progressive dilation noted , 5 cm currently.  Has seen vascular surgery Laneta Simmers),  Has had repeat ECHO,  Awaiting Normalization of BP

## 2021-03-27 NOTE — Progress Notes (Signed)
Subjective:  Patient ID: Courtney Lopez, female    DOB: 11-07-1960  Age: 60 y.o. MRN: [id number]  CC: The primary encounter diagnosis was Thyroid nodule. Diagnoses of Overweight, Ascending aorta dilatation (HCC), and Diabetic nephropathy associated with type 2 diabetes mellitus ([place]) were also pertinent to this visit.  HPI Courtney Lopez presents for  Chief Complaint  Patient presents with   Follow-up    6 month follow up on diabetes, hypertension    This visit occurred during the SARS-CoV-2 public health emergency.  Safety protocols were in place, including screening questions prior to the visit, additional usage of staff PPE, and extensive cleaning of exam room while observing appropriate contact time as indicated for disinfecting solutions.   HTN:  she has been having Elevated blood pressure readings high at home despite compliance to regimen and use of additional medications.  She has only been able to lower BP with rest  which has reduced her activity level and led to weight gain .   For renal angiogram tomorrow for FMD    She also notes multiple stressors , including her granddaughter  with retinoblastoma celebrating 1 yr birthday this weekend   AORTIC  dilation has increased  to 5 cm .  Saw [facility]le and  followed up with [facility]. 2nd opinion from [place] pending  ECHO done bicuspid valve? Still working .wants to postpone Aortic surgery until she can retire,  but too many unknowns  DM:   T2DM:  She  feels generally well,  But is not  exercising regularly due to uncontrolled hypertension and has gained weight . Checking  blood sugars  once daily at variable times,.  BS have been under 130 fasting and < 150 post prandially.  Denies any recent hypoglyemic events.  Taking  metformin  as directed. Following a carbohydrate modified diet 6 days per week. Denies numbness, burning and tingling of extremities. Appetite is good.  post prandial sugars 130 to 140..  fastings 110 to 112    Outpatient Medications Prior to Visit  Medication Sig Dispense Refill   aspirin EC 81 MG tablet Take 1 tablet (81 mg total) by mouth daily. Swallow whole.     estradiol (ESTRACE) 0.1 MG/GM vaginal cream INSERT 1/4 APPLICATORFUL VAGINALLY TWO TIMES A WEEK AS DIRECTED (Patient taking differently: Place [id number] Applicatorfuls vaginally 2 (two) times a week. Sundays & Thursdays) 42.5 g 5   fluticasone (FLONASE) 50 MCG/ACT nasal spray PLACE 2 SPRAYS INTO BOTH NOSTRILS DAILY. (Patient taking differently: Place 2 sprays into both nostrils every evening.) 16 g 2   hydrALAZINE (APRESOLINE) 25 MG tablet Take 1 tablet (25 mg total) by mouth 3 (three) times daily. 270 tablet 3   Menthol, Topical Analgesic, (BIOFREEZE EX) Apply 1 application topically at bedtime. Applied to neck area     metFORMIN (GLUCOPHAGE-XR) 750 MG 24 hr tablet Take 1 tablet by mouth daily with breakfast. 90 tablet 0   rosuvastatin (CRESTOR) 5 MG tablet Take 1 tablet (5 mg total) by mouth at bedtime. 90 tablet 2   sodium fluoride (PREVIDENT 5000 PLUS) 1.1 % CREA dental cream Brush teeth thoroughly for 2 minutes, do not rinse afterwards. (Patient taking differently: Place 1 application onto teeth 3 (three) times a week. At night) 51 g 10   traMADol (ULTRAM) 50 MG tablet TAKE 2 TABLETS (100 MG TOTAL) BY MOUTH EVERY 6 (SIX) HOURS AS NEEDED. 240 tablet 5   ALPRAZolam (XANAX) 0.25 MG tablet TAKE 1 TABLET BY MOUTH TWICE DAILY AS  NEEDED 60 tablet 5   carvedilol (COREG) 25 MG tablet Take one tablet  by mouth 2 (two) times daily with a meal. 180 tablet 1   losartan (COZAAR) 100 MG tablet TAKE 1 TABLET (100 MG TOTAL) BY MOUTH DAILY. 90 tablet 3   No facility-administered medications prior to visit.    Review of Systems;  Patient denies headache, fevers, malaise, unintentional weight loss, skin rash, eye pain, sinus congestion and sinus pain, sore throat, dysphagia,  hemoptysis , cough, dyspnea, wheezing, chest pain, palpitations, orthopnea,  edema, abdominal pain, nausea, melena, diarrhea, constipation, flank pain, dysuria, hematuria, urinary  Frequency, nocturia, numbness, tingling, seizures,  Focal weakness, Loss of consciousness,  Tremor, insomnia, depression, anxiety, and suicidal ideation.      Objective:  BP (!) 168/106 (BP Location: Left Arm, Patient Position: Sitting, Cuff Size: Normal)   Pulse 74   Temp (!) 96.4 F (35.8 C) (Temporal)   Ht 5\' 6"  (1.676 m)   Wt 187 lb 6.4 oz (85 kg)   LMP  (LMP Unknown)   SpO2 99%   BMI 30.25 kg/m   BP Readings from Last 3 Encounters:  03/27/21 (!) 168/106  03/23/21 (!) 162/110  01/03/21 (!) 174/106    Wt Readings from Last 3 Encounters:  03/27/21 187 lb 6.4 oz (85 kg)  03/23/21 189 lb (85.7 kg)  01/03/21 184 lb 2 oz (83.5 kg)    General appearance: alert, cooperative and appears stated age Ears: normal TM's and external ear canals both ears Throat: lips, mucosa, and tongue normal; teeth and gums normal Neck: no adenopathy, no carotid bruit, supple, symmetrical, trachea midline and thyroid not enlarged, symmetric, no tenderness/mass/nodules Back: symmetric, no curvature. ROM normal. No CVA tenderness. Lungs: clear to auscultation bilaterally Heart: regular rate and rhythm, S1, S2 normal, no murmur, click, rub or gallop Abdomen: soft, non-tender; bowel sounds normal; no masses,  no organomegaly Pulses: 2+ and symmetric Skin: Skin color, texture, turgor normal. No rashes or lesions Lymph nodes: Cervical, supraclavicular, and axillary nodes normal.  Lab Results  Component Value Date   HGBA1C 6.6 (H) 03/23/2021   HGBA1C 6.3 12/18/2020   HGBA1C 6.3 06/29/2020    Lab Results  Component Value Date   CREATININE 1.01 (H) 03/23/2021   CREATININE 1.13 12/18/2020   CREATININE 1.00 08/22/2020    Lab Results  Component Value Date   WBC 7.5 03/23/2021   HGB 12.8 03/23/2021   HCT 38.5 03/23/2021   PLT 319 03/23/2021   GLUCOSE 158 (H) 03/23/2021   CHOL 145 12/18/2020    TRIG 85.0 12/18/2020   HDL 60.30 12/18/2020   LDLDIRECT 101.0 11/11/2016   LDLCALC 68 12/18/2020   ALT 13 12/18/2020   AST 16 12/18/2020   NA 144 03/23/2021   K 4.7 03/23/2021   CL 103 03/23/2021   CREATININE 1.01 (H) 03/23/2021   BUN 18 03/23/2021   CO2 24 03/23/2021   TSH 3.03 06/29/2020   INR 1.0 10/21/2018   HGBA1C 6.6 (H) 03/23/2021   MICROALBUR 6.2 (H) 12/18/2020    MM 3D SCREEN BREAST BILATERAL  Result Date: 12/29/2020 CLINICAL DATA:  Screening. EXAM: DIGITAL SCREENING BILATERAL MAMMOGRAM WITH TOMOSYNTHESIS AND CAD TECHNIQUE: Bilateral screening digital craniocaudal and mediolateral oblique mammograms were obtained. Bilateral screening digital breast tomosynthesis was performed. The images were evaluated with computer-aided detection. COMPARISON:  Previous exam(s). ACR Breast Density Category b: There are scattered areas of fibroglandular density. FINDINGS: There are no findings suspicious for malignancy. IMPRESSION: No mammographic evidence of malignancy. A  result letter of this screening mammogram will be mailed directly to the patient. RECOMMENDATION: Screening mammogram in one year. (Code:SM-B-01Y) BI-RADS CATEGORY  1: Negative. Electronically Signed   By: Bary Richard M.D.   On: 12/29/2020 10:42   Assessment & Plan:   Problem List Items Addressed This Visit     Overweight    Discussed adding Ozempic or Mounjaro pending repeat thyroid ultrasound       Ascending aorta dilatation (HCC)    With progressive dilation noted , 5 cm currently.  Has seen vascular surgery Laneta Simmers),  Has had repeat ECHO,  Awaiting Normalization of BP       Relevant Medications   losartan (COZAAR) 100 MG tablet   carvedilol (COREG) 25 MG tablet   Thyroid nodule - Primary    Annual ultrasounds for 5 years Is planned to document stability ,  She has had 3 .      Relevant Medications   carvedilol (COREG) 25 MG tablet   Other Relevant Orders   US THYROID   Diabetic nephropathy associated  with type 2 diabetes mellitus (HCC)    Excellent glycemic control with  metformin XR.  Will add ozempic to manage weight gain pending repeat evaluation of thyroid nodules .  Continue ARB and statin    Lab Results  Component Value Date   HGBA1C 6.6 (H) 03/23/2021   Lab Results  Component Value Date   MICROALBUR 6.2 (H) 12/18/2020   MICROALBUR 3.9 (H) 10/20/2019           Relevant Medications   losartan (COZAAR) 100 MG tablet    I provided  30 minutes  during this encounter reviewing patient's current problems and past surgeries, labs and imaging studies, providing counseling on the above mentioned problems I na face to face visit  , and coordination  of care .  Meds ordered this encounter  Medications   losartan (COZAAR) 100 MG tablet    Sig: TAKE 1 TABLET (100 MG TOTAL) BY MOUTH DAILY.    Dispense:  30 tablet    Refill:  0   carvedilol (COREG) 25 MG tablet    Sig: Take one tablet  by mouth 2 (two) times daily with a meal.    Dispense:  180 tablet    Refill:  1   ALPRAZolam (XANAX) 0.25 MG tablet    Sig: TAKE 1 TABLET BY MOUTH TWICE DAILY AS NEEDED    Dispense:  60 tablet    Refill:  5   HYDROcodone-acetaminophen (NORCO) 10-325 MG tablet    Sig: TAKE 1 TABLET BY MOUTH EVERY 6 (SIX) HOURS AS NEEDED FOR SEVERE PAIN.    Dispense:  30 tablet    Refill:  0    Medications Discontinued During This Encounter  Medication Reason   losartan (COZAAR) 100 MG tablet Reorder   carvedilol (COREG) 25 MG tablet Reorder   ALPRAZolam (XANAX) 0.25 MG tablet Reorder    Follow-up: Return in about 6 months (around 09/24/2021) for follow up diabetes.   Sherlene Shams, MD

## 2021-03-27 NOTE — Assessment & Plan Note (Signed)
Discussed adding Ozempic or Mounjaro pending repeat thyroid ultrasound

## 2021-03-27 NOTE — Assessment & Plan Note (Signed)
Annual ultrasounds for 5 years Is planned to document stability ,  She has had 3 .

## 2021-03-27 NOTE — Patient Instructions (Signed)
Thyroid ultrasound ordered  If there has been no change in the nodules,  I will send rx for Ozempic or mounjaro to your pharmacy

## 2021-03-27 NOTE — Telephone Encounter (Signed)
Lft pt vm to call ofc to sch US thyroid. thanks 

## 2021-03-27 NOTE — Assessment & Plan Note (Signed)
Excellent glycemic control with  metformin XR.  Will add ozempic to manage weight gain pending repeat evaluation of thyroid nodules .  Continue ARB and statin    Lab Results  Component Value Date   HGBA1C 6.6 (H) 03/23/2021   Lab Results  Component Value Date   MICROALBUR 6.2 (H) 12/18/2020   MICROALBUR 3.9 (H) 10/20/2019

## 2021-03-27 NOTE — Telephone Encounter (Signed)
Renal angiogram scheduled at [facility] for: Wednesday March 28, 2021 2:30 PM [facility] Main Entrance [facility]) at: 12:30 PM   No solid food after midnight prior to cath, clear liquids until 5 AM day of procedure.  Medication instructions: Hold: Metformin-day of procedure and 48 hours post procedure  Except hold medications usual morning medications can be taken pre-cath with sips of water including aspirin 81 mg.    Confirmed patient has responsible adult to drive home post procedure and be with patient first 24 hours after arriving home.  [facility] does allow one visitor to accompany you and wait in the hospital waiting room while you are there for your procedure. You and your visitor will be asked to wear a mask once you enter the hospital.   Left detailed message (DPR) with procedure instructions at mobile number listed for patient.

## 2021-03-28 ENCOUNTER — Encounter (HOSPITAL_COMMUNITY)
Admission: RE | Disposition: A | Discharge status: Home / Self Care | Payer: Self-pay | Source: Ambulatory Visit | Attending: Cardiovascular Disease

## 2021-03-28 ENCOUNTER — Ambulatory Visit (HOSPITAL_COMMUNITY)
Admission: RE | Admit: 2021-03-28 | Discharge: 2021-03-29 | Disposition: A | Discharge status: Home / Self Care | Payer: 59 | Source: Ambulatory Visit | Attending: Cardiovascular Disease | Admitting: Cardiovascular Disease

## 2021-03-28 ENCOUNTER — Other Ambulatory Visit: Payer: Self-pay

## 2021-03-28 DIAGNOSIS — I701 Atherosclerosis of renal artery: Secondary | ICD-10-CM | POA: Diagnosis not present

## 2021-03-28 DIAGNOSIS — E119 Type 2 diabetes mellitus without complications: Secondary | ICD-10-CM | POA: Insufficient documentation

## 2021-03-28 DIAGNOSIS — I7121 Aneurysm of the ascending aorta, without rupture: Secondary | ICD-10-CM | POA: Insufficient documentation

## 2021-03-28 DIAGNOSIS — E785 Hyperlipidemia, unspecified: Secondary | ICD-10-CM | POA: Diagnosis not present

## 2021-03-28 DIAGNOSIS — I350 Nonrheumatic aortic (valve) stenosis: Secondary | ICD-10-CM | POA: Insufficient documentation

## 2021-03-28 DIAGNOSIS — I6529 Occlusion and stenosis of unspecified carotid artery: Secondary | ICD-10-CM | POA: Diagnosis not present

## 2021-03-28 HISTORY — PX: PERIPHERAL VASCULAR INTERVENTION: CATH118257

## 2021-03-28 HISTORY — PX: RENAL ANGIOGRAPHY: CATH118260

## 2021-03-28 LAB — GLUCOSE, CAPILLARY
Glucose-Capillary: 141 mg/dL — ABNORMAL HIGH (ref 70–99)
Glucose-Capillary: 101 mg/dL — ABNORMAL HIGH (ref 70–99)

## 2021-03-28 LAB — POCT ACTIVATED CLOTTING TIME: Activated Clotting Time: 225 seconds

## 2021-03-28 SURGERY — RENAL ANGIOGRAPHY
Anesthesia: LOCAL

## 2021-03-28 MED ORDER — ALPRAZOLAM 0.25 MG PO TABS: 0.2500 mg | ORAL_TABLET | Freq: Two times a day (BID) | ORAL | Status: DC | PRN

## 2021-03-28 MED ORDER — SODIUM CHLORIDE 0.9 % IV SOLN: 250.0000 mL | INTRAVENOUS | Status: DC | PRN

## 2021-03-28 MED ORDER — TRAMADOL HCL 50 MG PO TABS
100.0000 mg | ORAL_TABLET | Freq: Four times a day (QID) | ORAL | Status: DC | PRN
  Administered 2021-03-28: 100 mg via ORAL
  Filled 2021-03-28: qty 2

## 2021-03-28 MED ORDER — ASPIRIN EC 81 MG PO TBEC
81.0000 mg | DELAYED_RELEASE_TABLET | Freq: Every day | ORAL | Status: DC
  Administered 2021-03-29: 09:00:00 81 mg via ORAL
  Filled 2021-03-28 (×2): qty 1

## 2021-03-28 MED ORDER — SODIUM CHLORIDE 0.9% FLUSH: 3.0000 mL | Freq: Two times a day (BID) | INTRAVENOUS | Status: DC

## 2021-03-28 MED ORDER — LIDOCAINE HCL (PF) 1 % IJ SOLN
INTRAMUSCULAR | Status: DC | PRN
  Administered 2021-03-28: 20 mL

## 2021-03-28 MED ORDER — CLOPIDOGREL BISULFATE 300 MG PO TABS
ORAL_TABLET | ORAL | Status: DC | PRN
  Administered 2021-03-28: 300 mg via ORAL

## 2021-03-28 MED ORDER — SODIUM CHLORIDE 0.9% FLUSH: 3.0000 mL | INTRAVENOUS | Status: DC | PRN

## 2021-03-28 MED ORDER — HYDRALAZINE HCL 25 MG PO TABS
25.0000 mg | ORAL_TABLET | Freq: Three times a day (TID) | ORAL | Status: DC
  Administered 2021-03-28 – 2021-03-29 (×2): 25 mg via ORAL
  Filled 2021-03-28 (×3): qty 1

## 2021-03-28 MED ORDER — FENTANYL CITRATE (PF) 100 MCG/2ML IJ SOLN
INTRAMUSCULAR | Status: DC | PRN
  Administered 2021-03-28: 50 ug via INTRAVENOUS

## 2021-03-28 MED ORDER — ROSUVASTATIN CALCIUM 5 MG PO TABS
5.0000 mg | ORAL_TABLET | Freq: Every day | ORAL | Status: DC
  Administered 2021-03-28: 5 mg via ORAL
  Filled 2021-03-28: qty 1

## 2021-03-28 MED ORDER — HEPARIN (PORCINE) IN NACL 1000-0.9 UT/500ML-% IV SOLN
INTRAVENOUS | Status: DC | PRN
  Administered 2021-03-28 (×2): 500 mL

## 2021-03-28 MED ORDER — HEPARIN SODIUM (PORCINE) 1000 UNIT/ML IJ SOLN
INTRAMUSCULAR | Status: DC | PRN
  Administered 2021-03-28: 5000 [IU] via INTRAVENOUS
  Administered 2021-03-28: 2000 [IU] via INTRAVENOUS

## 2021-03-28 MED ORDER — SODIUM CHLORIDE 0.9 % WEIGHT BASED INFUSION: 3.0000 mL/kg/h | INTRAVENOUS | Status: DC

## 2021-03-28 MED ORDER — HEPARIN SODIUM (PORCINE) 1000 UNIT/ML IJ SOLN
INTRAMUSCULAR | Status: AC
  Filled 2021-03-28: qty 1

## 2021-03-28 MED ORDER — CLOPIDOGREL BISULFATE 75 MG PO TABS
75.0000 mg | ORAL_TABLET | Freq: Every day | ORAL | Status: DC
  Administered 2021-03-29: 09:00:00 75 mg via ORAL
  Filled 2021-03-28: qty 1

## 2021-03-28 MED ORDER — CARVEDILOL 25 MG PO TABS
25.0000 mg | ORAL_TABLET | Freq: Two times a day (BID) | ORAL | Status: DC
  Administered 2021-03-28 – 2021-03-29 (×2): 25 mg via ORAL
  Filled 2021-03-28 (×2): qty 1

## 2021-03-28 MED ORDER — FENTANYL CITRATE (PF) 100 MCG/2ML IJ SOLN
INTRAMUSCULAR | Status: AC
  Filled 2021-03-28: qty 2

## 2021-03-28 MED ORDER — HYDROCODONE-ACETAMINOPHEN 10-325 MG PO TABS: 1.0000 | ORAL_TABLET | Freq: Four times a day (QID) | ORAL | Status: DC | PRN

## 2021-03-28 MED ORDER — SODIUM CHLORIDE 0.9 % IV SOLN: INTRAVENOUS | Status: AC

## 2021-03-28 MED ORDER — ONDANSETRON HCL 4 MG/2ML IJ SOLN: 4.0000 mg | Freq: Four times a day (QID) | INTRAMUSCULAR | Status: DC | PRN

## 2021-03-28 MED ORDER — HEPARIN (PORCINE) IN NACL 1000-0.9 UT/500ML-% IV SOLN
INTRAVENOUS | Status: AC
  Filled 2021-03-28: qty 1000

## 2021-03-28 MED ORDER — MIDAZOLAM HCL 2 MG/2ML IJ SOLN
INTRAMUSCULAR | Status: AC
  Filled 2021-03-28: qty 2

## 2021-03-28 MED ORDER — LOSARTAN POTASSIUM 50 MG PO TABS
100.0000 mg | ORAL_TABLET | Freq: Every day | ORAL | Status: DC
  Administered 2021-03-29: 09:00:00 100 mg via ORAL
  Filled 2021-03-28: qty 2

## 2021-03-28 MED ORDER — FLUTICASONE PROPIONATE 50 MCG/ACT NA SUSP
2.0000 | Freq: Every evening | NASAL | Status: DC
  Administered 2021-03-28: 2 via NASAL
  Filled 2021-03-28: qty 16

## 2021-03-28 MED ORDER — MIDAZOLAM HCL 2 MG/2ML IJ SOLN
INTRAMUSCULAR | Status: DC | PRN
  Administered 2021-03-28: 1 mg via INTRAVENOUS

## 2021-03-28 MED ORDER — ASPIRIN 81 MG PO CHEW
81.0000 mg | CHEWABLE_TABLET | ORAL | Status: AC
  Administered 2021-03-28: 81 mg via ORAL
  Filled 2021-03-28: qty 1

## 2021-03-28 MED ORDER — SODIUM CHLORIDE 0.9 % WEIGHT BASED INFUSION: 1.0000 mL/kg/h | INTRAVENOUS | Status: DC

## 2021-03-28 MED ORDER — IODIXANOL 320 MG/ML IV SOLN
INTRAVENOUS | Status: DC | PRN
  Administered 2021-03-28: 80 mL via INTRA_ARTERIAL

## 2021-03-28 MED ORDER — HYDRALAZINE HCL 25 MG PO TABS
25.0000 mg | ORAL_TABLET | Freq: Once | ORAL | Status: AC
  Administered 2021-03-28: 25 mg via ORAL

## 2021-03-28 MED ORDER — CLOPIDOGREL BISULFATE 300 MG PO TABS
ORAL_TABLET | ORAL | Status: AC
  Filled 2021-03-28: qty 1

## 2021-03-28 MED ORDER — LIDOCAINE HCL (PF) 1 % IJ SOLN
INTRAMUSCULAR | Status: AC
  Filled 2021-03-28: qty 30

## 2021-03-28 SURGICAL SUPPLY — 17 items
BALLN EMERGE MR 4.0X8 (BALLOONS) ×4
BALLOON EMERGE MR 4.0X8 (BALLOONS) ×2 IMPLANT
CATH ANGIO 5F PIGTAIL 65CM (CATHETERS) ×4 IMPLANT
DEVICE CLOSURE MYNXGRIP 6/7F (Vascular Products) ×4 IMPLANT
GUIDE CATH VISTA RDC 6F (CATHETERS) ×4 IMPLANT
KIT ENCORE 26 ADVANTAGE (KITS) ×4 IMPLANT
KIT MICROPUNCTURE NIT STIFF (SHEATH) ×4 IMPLANT
KIT PV (KITS) ×4 IMPLANT
SHEATH PINNACLE 6F 10CM (SHEATH) ×8 IMPLANT
STENT HERCULINK RX 5.5X15X135 (Permanent Stent) ×4 IMPLANT
STOPCOCK MORSE 400PSI 3WAY (MISCELLANEOUS) ×4 IMPLANT
SYR MEDRAD MARK 7 150ML (SYRINGE) ×4 IMPLANT
TRANSDUCER W/STOPCOCK (MISCELLANEOUS) ×4 IMPLANT
TRAY PV CATH (CUSTOM PROCEDURE TRAY) ×4 IMPLANT
TUBING CIL FLEX 10 FLL-RA (TUBING) ×4 IMPLANT
WIRE HITORQ VERSACORE ST 145CM (WIRE) ×4 IMPLANT
WIRE SPARTACORE .014X190CM (WIRE) ×4 IMPLANT

## 2021-03-28 NOTE — Interval H&P Note (Signed)
History and Physical Interval Note:  03/28/2021 2:47 PM  Courtney Lopez  has presented today for surgery, with the diagnosis of renal artery stenosis.  The various methods of treatment have been discussed with the patient and family. After consideration of risks, benefits and other options for treatment, the patient has consented to  Procedure(s): RENAL ANGIOGRAPHY (N/A) as a surgical intervention.  The patient's history has been reviewed, patient examined, no change in status, stable for surgery.  I have reviewed the patient's chart and labs.  Questions were answered to the patient's satisfaction.     Lorine Bears

## 2021-03-28 NOTE — Progress Notes (Signed)
Pt admitted to 3W AxOx4, BP elevated but Pt hasn't had any of her medications yet. 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 BG 101. All questions and concerns addressed. Call bell placed within reach, will continue to monitor and maintain safety.

## 2021-03-29 ENCOUNTER — Other Ambulatory Visit (HOSPITAL_COMMUNITY): Payer: Self-pay

## 2021-03-29 ENCOUNTER — Encounter (HOSPITAL_COMMUNITY): Payer: Self-pay | Admitting: Cardiovascular Disease

## 2021-03-29 ENCOUNTER — Other Ambulatory Visit: Payer: Self-pay

## 2021-03-29 ENCOUNTER — Telehealth: Payer: Self-pay | Admitting: Internal Medicine

## 2021-03-29 DIAGNOSIS — E119 Type 2 diabetes mellitus without complications: Secondary | ICD-10-CM | POA: Diagnosis not present

## 2021-03-29 DIAGNOSIS — E785 Hyperlipidemia, unspecified: Secondary | ICD-10-CM | POA: Diagnosis not present

## 2021-03-29 DIAGNOSIS — I6529 Occlusion and stenosis of unspecified carotid artery: Secondary | ICD-10-CM | POA: Diagnosis not present

## 2021-03-29 DIAGNOSIS — I701 Atherosclerosis of renal artery: Secondary | ICD-10-CM

## 2021-03-29 DIAGNOSIS — I7121 Aneurysm of the ascending aorta, without rupture: Secondary | ICD-10-CM | POA: Diagnosis not present

## 2021-03-29 DIAGNOSIS — I350 Nonrheumatic aortic (valve) stenosis: Secondary | ICD-10-CM | POA: Diagnosis not present

## 2021-03-29 MED ORDER — CLOPIDOGREL BISULFATE 75 MG PO TABS
75.0000 mg | ORAL_TABLET | Freq: Every day | ORAL | 0 refills | Status: DC
  Filled 2021-03-29: qty 30, 30d supply, fill #0

## 2021-03-29 NOTE — Telephone Encounter (Signed)
Lft pt vm to call ofc to sch US. thanks ?

## 2021-03-29 NOTE — Discharge Summary (Signed)
Discharge Summary    Patient ID: Courtney Lopez MRN: [id number]; DOB: 10/11/1960  Admit date: 03/28/2021 Discharge date: 03/29/2021  PCP:  Sherlene Shams, MD   [facility] Providers Cardiologist:  Julien Nordmann, MD     Discharge Diagnoses    Active Problems:   RAS (renal artery stenosis) [facility])   Diagnostic Studies/Procedures    PV/renal artery angiogram: 03/28/21  Severe 95% stenosis in the proximal left renal artery 2.  Successful angioplasty and stent placement to the left renal artery.   Recommendations: Monitor blood pressure overnight with gentle hydration. Recommend dual antiplatelet therapy for 1 month.   _____________   History of Present Illness     Courtney Lopez is a 60 y.o. female with PMH of renal artery stenosis, DM, carotid artery disease, moderate AS, FMD and AAA who presented to the office on 11/11 with Dr. Kirke Corin. She has known history of bicuspid aortic valve with ascending aortic aneurysm, renal artery stenosis, hyperlipidemia and type 2 diabetes. She had CTA of the aorta in 2020 which showed evidence of significant left renal artery stenosis with lack of significant calcifications. This was suggestive of fibromuscular dysplasia. Left renal artery stenosis was confirmed with renal artery duplex. Repeat renal artery duplex in 2021 showed significant stenosis in the mid left renal artery with peak velocity of 439.  Carotid Doppler showed no significant disease.   Blood pressure was initially controlled with medications with stable renal function and thus renal artery angiography was not pursued.  However, over the last 6 months, her blood pressure became uncontrolled in spite of taking her medications regularly.  Amlodipine was associated with leg edema and she stopped taking the medication but she has been using it as needed recently given the difficulty in controlling her blood pressure.  She had recent CT that showed that her left kidney  appeared to be much smaller than what it was in 2020.  Repeat renal artery duplex was performed which confirmed left renal artery atrophy with significant stenosis in the left renal artery.  No significant disease on the right side. With uncontrolled hypertension, she had been having increased headaches as well as shortness of breath and leg edema. With her uncontrolled BPs it was recommended that she undergo outpatient renal artery angiogram.   Hospital Course     Underwent PV/renal artery angiogram with Dr. Kirke Corin with successful angioplasty and stenting of the left renal artery. Recommendations for DAPT with ASA/plavix for at least one month. No complications noted overnight. Able to ambulate without difficulty. Home medications were continued the same.   General: Well developed, well nourished, female appearing in no acute distress. Head: Normocephalic, atraumatic.  Neck: Supple without bruits, JVD. Lungs:  Resp regular and unlabored, CTA. Heart: RRR, S1, S2, no S3, S4, or murmur; no rub. Abdomen: Soft, non-tender, non-distended with normoactive bowel sounds. No hepatomegaly. No rebound/guarding. No obvious abdominal masses. Extremities: No clubbing, cyanosis, edema. Distal pedal pulses are 2+ bilaterally. Right femoral cath site stable without bruising or hematoma Neuro: Alert and oriented X 3. Moves all extremities spontaneously. Psych: Normal affect.  Did the patient have an acute coronary syndrome (MI, NSTEMI, STEMI, etc) this admission?:  No                               Did the patient have a percutaneous coronary intervention (stent / angioplasty)?:  No.   _____________  Discharge Vitals Blood pressure (!) 157/97,  pulse 67, temperature 97.7 F (36.5 C), temperature source Oral, resp. rate 15, height 5' 5.5" (1.664 m), weight 84.8 kg, SpO2 98 %.  Filed Weights   03/28/21 1229  Weight: 84.8 kg    Labs & Radiologic Studies    CBC No results for input(s): WBC, NEUTROABS, HGB,  HCT, MCV, PLT in the last 72 hours. Basic Metabolic Panel No results for input(s): NA, K, CL, CO2, GLUCOSE, BUN, CREATININE, CALCIUM, MG, PHOS in the last 72 hours. Liver Function Tests No results for input(s): AST, ALT, ALKPHOS, BILITOT, PROT, ALBUMIN in the last 72 hours. No results for input(s): LIPASE, AMYLASE in the last 72 hours. High Sensitivity Troponin:   No results for input(s): TROPONINIHS in the last 720 hours.  BNP Invalid input(s): POCBNP D-Dimer No results for input(s): DDIMER in the last 72 hours. Hemoglobin A1C No results for input(s): HGBA1C in the last 72 hours. Fasting Lipid Panel No results for input(s): CHOL, HDL, LDLCALC, TRIG, CHOLHDL, LDLDIRECT in the last 72 hours. Thyroid Function Tests No results for input(s): TSH, T4TOTAL, T3FREE, THYROIDAB in the last 72 hours.  Invalid input(s): FREET3 _____________  PERIPHERAL VASCULAR CATHETERIZATION  Result Date: 03/28/2021 Severe 95% stenosis in the proximal left renal artery 2.  Successful angioplasty and stent placement to the left renal artery. Recommendations: Monitor blood pressure overnight with gentle hydration. Recommend dual antiplatelet therapy for 1 month.   VAS US RENAL ARTERY DUPLEX  Result Date: 03/13/2021 ABDOMINAL VISCERAL Patient Name:  Courtney Lopez  Date of Exam:   03/13/2021 Medical Rec #: [id number]             Accession #:    [id number] Date of Birth: 02-Oct-1960              Patient Gender: F Patient Age:   30 years Exam Location:  [place] Procedure:      VAS US RENAL ARTERY DUPLEX Referring Phys: [id number] Antonieta Iba -------------------------------------------------------------------------------- Indications: Follow up to left renal arterial stenosis and known left renal              atrophy. There is a question of fibromuscular dysplasia seen on the              CTA. High Risk Factors: Hypertension, hyperlipidemia, Diabetes, no history of                    smoking. Other Factors: Most  recent blood work 12/18/2020 BUN 22 Creatinine 1.13. Limitations: Air/bowel gas. Comparison Study: Renal arterial duplex exam 12/15/2019 showed highest velocity in                   right renal artery 172 cm/s and left renal artery 439 cm/s.                    CTA Angio chest aorta 12/19/2020 showed a left renal arterial                   oxtium >70 % stenosis with left renal atrophy. Right renal                   measured 12.5 cm and left not mentioned. Performing Technologist: Carlos American RVT, RDCS (AE), RDMS  Examination Guidelines: A complete evaluation includes B-mode imaging, spectral Doppler, color Doppler, and power Doppler as needed of all accessible portions of each vessel. Bilateral testing is considered an integral part of a complete examination. Limited examinations for reoccurring indications  may be performed as noted.  Duplex Findings: +--------------------+--------+--------+------+--------+ Mesenteric          PSV cm/sEDV cm/sPlaqueComments +--------------------+--------+--------+------+--------+ Aorta Prox             68                          +--------------------+--------+--------+------+--------+ Aorta Distal           89                          +--------------------+--------+--------+------+--------+ Celiac Artery Origin   71                          +--------------------+--------+--------+------+--------+ SMA Origin            120      20                  +--------------------+--------+--------+------+--------+    +------------------+--------+--------+-------+ Right Renal ArteryPSV cm/sEDV cm/sComment +------------------+--------+--------+-------+ Origin               94      26           +------------------+--------+--------+-------+ Proximal            114      40           +------------------+--------+--------+-------+ Mid                 106      43           +------------------+--------+--------+-------+ Distal               61      22            +------------------+--------+--------+-------+ +-----------------+--------+--------+--------------------+ Left Renal ArteryPSV cm/sEDV cm/s      Comment        +-----------------+--------+--------+--------------------+ Origin              89      16                        +-----------------+--------+--------+--------------------+ Proximal           549     231   turbulent flow noted +-----------------+--------+--------+--------------------+ Mid                 32      7                         +-----------------+--------+--------+--------------------+ Distal              33      18                        +-----------------+--------+--------+--------------------+  Technologist observations: Right inferior pole anechoic avascular mass measuring 6.5 x 4.8 x 6.5 cm, most likely representing a renal cyst. Unable to duplicate right renal arterial velocities from prior exam. Left renal artery distal to stenosis and segmental arteries within renal have delayed acceleration. +------------+--------+--------+----+-----------+--------+--------+----+ Right KidneyPSV cm/sEDV cm/sRI  Left KidneyPSV cm/sEDV cm/sRI   +------------+--------+--------+----+-----------+--------+--------+----+ Upper Pole  26      11      0.59Upper Pole 14      7       0.51 +------------+--------+--------+----+-----------+--------+--------+----+ Mid         52  19      0.        11      4       0.60 +------------+--------+--------+----+-----------+--------+--------+----+ Lower Pole  14      5       0.62Lower Pole 19      10      0.47 +------------+--------+--------+----+-----------+--------+--------+----+ Hilar       48      17      0.65Hilar      20      11      0.46 +------------+--------+--------+----+-----------+--------+--------+----+ +------------------+-------+------------------+--------+ Right Kidney             Left Kidney                 +------------------+-------+------------------+--------+ RAR                      RAR                        +------------------+-------+------------------+--------+ RAR (manual)      1.7    RAR (manual)      8.1      +------------------+-------+------------------+--------+ Cortex                   Cortex                     +------------------+-------+------------------+--------+ Cortex thickness  8.00 mmCorex thickness   10.00 mm +------------------+-------+------------------+--------+ Kidney length (cm)12.90  Kidney length (cm)6.30     +------------------+-------+------------------+--------+  Summary: Largest Aortic Diameter: 2.4 cm  Renal:  Right: Normal size right kidney. Normal right Resisitive Index.        Normal cortical thickness of right kidney. RRV flow present.        Cyst(s) noted. No evidence of right renal artery stenosis. Left:  Abnormal size for the left kidney. Normal left Resistive        Index. LRV flow present. Evidence of a > 60% stenosis in the        left renal artery. Normal cortical thickness of the left        kidney. Mesenteric: Normal Celiac artery and Superior Mesenteric artery findings.  *See table(s) above for measurements and observations.  Vascular consult recommended. Diagnosing physician: Charlton Haws MD  Electronically signed by Charlton Haws MD on 03/13/2021 at 2:10:09 PM.    Final    Disposition   Pt is being discharged home today in good condition.  Follow-up Plans & Appointments     Follow-up Information     Iran Ouch, MD Follow up on 04/20/2021.   Specialty: Cardiology Why: at 9:20am for your follow up appt Contact information: [address] [place] [address] [phone]                Discharge Instructions     Call MD for:  difficulty breathing, headache or visual disturbances   Complete by: As directed    Call MD for:  persistant dizziness or light-headedness   Complete by: As directed     Call MD for:  redness, tenderness, or signs of infection (pain, swelling, redness, odor or green/yellow discharge around incision site)   Complete by: As directed    Diet - low sodium heart healthy   Complete by: As directed    Discharge instructions   Complete by: As directed    Groin Site Care Refer to this sheet in  the next few weeks. These instructions provide you with information on caring for yourself after your procedure. Your caregiver may also give you more specific instructions. Your treatment has been planned according to current medical practices, but problems sometimes occur. Call your caregiver if you have any problems or questions after your procedure. HOME CARE INSTRUCTIONS You may shower 24 hours after the procedure. Remove the bandage (dressing) and gently wash the site with plain soap and water. Gently pat the site dry.  Do not apply powder or lotion to the site.  Do not sit in a bathtub, swimming pool, or whirlpool for 5 to 7 days.  No bending, squatting, or lifting anything over 10 pounds (4.5 kg) as directed by your caregiver.  Inspect the site at least twice daily.  Do not drive home if you are discharged the same day of the procedure. Have someone else drive you.  You may drive 24 hours after the procedure unless otherwise instructed by your caregiver.  What to expect: Any bruising will usually fade within 1 to 2 weeks.  Blood that collects in the tissue (hematoma) may be painful to the touch. It should usually decrease in size and tenderness within 1 to 2 weeks.  SEEK IMMEDIATE MEDICAL CARE IF: You have unusual pain at the groin site or down the affected leg.  You have redness, warmth, swelling, or pain at the groin site.  You have drainage (other than a small amount of blood on the dressing).  You have chills.  You have a fever or persistent symptoms for more than 72 hours.  You have a fever and your symptoms suddenly get worse.  Your leg becomes pale, cool,  tingly, or numb.  You have heavy bleeding from the site. Hold pressure on the site. .   Increase activity slowly   Complete by: As directed        Discharge Medications   Allergies as of 03/29/2021       Reactions   Hctz [hydrochlorothiazide] Other (See Comments)   Decreased gfr   Prozac [fluoxetine Hcl] Palpitations        Medication List     TAKE these medications    ALPRAZolam 0.25 MG tablet Commonly known as: XANAX TAKE 1 TABLET BY MOUTH TWICE DAILY AS NEEDED   aspirin EC 81 MG tablet Take 1 tablet (81 mg total) by mouth daily. Swallow whole.   BIOFREEZE EX Apply 1 application topically at bedtime. Applied to neck area   carvedilol 25 MG tablet Commonly known as: COREG Take one tablet  by mouth 2 (two) times daily with a meal.   clopidogrel 75 MG tablet Commonly known as: PLAVIX Take 1 tablet (75 mg total) by mouth daily with breakfast.   estradiol 0.1 MG/GM vaginal cream Commonly known as: ESTRACE INSERT 1/4 APPLICATORFUL VAGINALLY TWO TIMES A WEEK AS DIRECTED What changed:  how much to take how to take this when to take this additional instructions   fluticasone 50 MCG/ACT nasal spray Commonly known as: FLONASE PLACE 2 SPRAYS INTO BOTH NOSTRILS DAILY. What changed: when to take this   hydrALAZINE 25 MG tablet Commonly known as: APRESOLINE Take 1 tablet (25 mg total) by mouth 3 (three) times daily.   HYDROcodone-acetaminophen 10-325 MG tablet Commonly known as: NORCO TAKE 1 TABLET BY MOUTH EVERY 6 (SIX) HOURS AS NEEDED FOR SEVERE PAIN.   losartan 100 MG tablet Commonly known as: COZAAR TAKE 1 TABLET (100 MG TOTAL) BY MOUTH DAILY.   metFORMIN 750  MG 24 hr tablet Commonly known as: GLUCOPHAGE-XR Take 1 tablet by mouth daily with breakfast.   rosuvastatin 5 MG tablet Commonly known as: CRESTOR Take 1 tablet (5 mg total) by mouth at bedtime.   SF 5000 Plus 1.1 % Crea dental cream Generic drug: sodium fluoride Brush teeth thoroughly for 2  minutes, do not rinse afterwards. What changed:  how much to take how to take this when to take this additional instructions   traMADol 50 MG tablet Commonly known as: ULTRAM TAKE 2 TABLETS (100 MG TOTAL) BY MOUTH EVERY 6 (SIX) HOURS AS NEEDED.        Outstanding Labs/Studies   N/a   Duration of Discharge Encounter   Greater than 30 minutes including physician time.  Signed, Laverda Page, NP 03/29/2021, 8:39 AM

## 2021-03-30 ENCOUNTER — Other Ambulatory Visit: Payer: Self-pay

## 2021-04-02 ENCOUNTER — Other Ambulatory Visit: Payer: Self-pay

## 2021-04-03 ENCOUNTER — Other Ambulatory Visit: Payer: Self-pay

## 2021-04-03 LAB — HM DIABETES EYE EXAM

## 2021-04-03 MED ORDER — SODIUM FLUORIDE 1.1 % DT CREA
TOPICAL_CREAM | DENTAL | 12 refills | Status: DC
  Filled 2021-04-03: qty 51, 30d supply, fill #0
  Filled 2021-06-24: qty 51, 30d supply, fill #1
  Filled 2021-10-18: qty 51, 30d supply, fill #2

## 2021-04-05 ENCOUNTER — Emergency Department: Payer: 59

## 2021-04-05 ENCOUNTER — Other Ambulatory Visit: Payer: Self-pay

## 2021-04-05 ENCOUNTER — Encounter: Payer: Self-pay | Admitting: Emergency Medicine

## 2021-04-05 ENCOUNTER — Emergency Department
Admission: EM | Admit: 2021-04-05 | Discharge: 2021-04-05 | Disposition: A | Discharge status: Home / Self Care | Payer: 59 | Attending: Emergency Medicine | Admitting: Emergency Medicine

## 2021-04-05 DIAGNOSIS — Z20822 Contact with and (suspected) exposure to covid-19: Secondary | ICD-10-CM | POA: Diagnosis not present

## 2021-04-05 DIAGNOSIS — Z7982 Long term (current) use of aspirin: Secondary | ICD-10-CM | POA: Insufficient documentation

## 2021-04-05 DIAGNOSIS — S301XXA Contusion of abdominal wall, initial encounter: Secondary | ICD-10-CM | POA: Diagnosis not present

## 2021-04-05 DIAGNOSIS — X58XXXA Exposure to other specified factors, initial encounter: Secondary | ICD-10-CM | POA: Diagnosis not present

## 2021-04-05 DIAGNOSIS — N200 Calculus of kidney: Secondary | ICD-10-CM | POA: Diagnosis not present

## 2021-04-05 DIAGNOSIS — M545 Low back pain, unspecified: Secondary | ICD-10-CM | POA: Insufficient documentation

## 2021-04-05 DIAGNOSIS — N261 Atrophy of kidney (terminal): Secondary | ICD-10-CM | POA: Diagnosis not present

## 2021-04-05 DIAGNOSIS — E1121 Type 2 diabetes mellitus with diabetic nephropathy: Secondary | ICD-10-CM | POA: Insufficient documentation

## 2021-04-05 DIAGNOSIS — R233 Spontaneous ecchymoses: Secondary | ICD-10-CM | POA: Diagnosis not present

## 2021-04-05 DIAGNOSIS — R0602 Shortness of breath: Secondary | ICD-10-CM | POA: Diagnosis not present

## 2021-04-05 DIAGNOSIS — I10 Essential (primary) hypertension: Secondary | ICD-10-CM | POA: Diagnosis not present

## 2021-04-05 DIAGNOSIS — Z8616 Personal history of COVID-19: Secondary | ICD-10-CM | POA: Insufficient documentation

## 2021-04-05 DIAGNOSIS — M5459 Other low back pain: Secondary | ICD-10-CM | POA: Diagnosis not present

## 2021-04-05 DIAGNOSIS — I7121 Aneurysm of the ascending aorta, without rupture: Secondary | ICD-10-CM | POA: Diagnosis not present

## 2021-04-05 DIAGNOSIS — M542 Cervicalgia: Secondary | ICD-10-CM | POA: Insufficient documentation

## 2021-04-05 DIAGNOSIS — Z7984 Long term (current) use of oral hypoglycemic drugs: Secondary | ICD-10-CM | POA: Diagnosis not present

## 2021-04-05 DIAGNOSIS — R509 Fever, unspecified: Secondary | ICD-10-CM | POA: Insufficient documentation

## 2021-04-05 DIAGNOSIS — S3991XA Unspecified injury of abdomen, initial encounter: Secondary | ICD-10-CM | POA: Diagnosis present

## 2021-04-05 DIAGNOSIS — Z79899 Other long term (current) drug therapy: Secondary | ICD-10-CM | POA: Diagnosis not present

## 2021-04-05 DIAGNOSIS — N281 Cyst of kidney, acquired: Secondary | ICD-10-CM | POA: Diagnosis not present

## 2021-04-05 DIAGNOSIS — T148XXA Other injury of unspecified body region, initial encounter: Secondary | ICD-10-CM

## 2021-04-05 DIAGNOSIS — K449 Diaphragmatic hernia without obstruction or gangrene: Secondary | ICD-10-CM | POA: Diagnosis not present

## 2021-04-05 DIAGNOSIS — K5730 Diverticulosis of large intestine without perforation or abscess without bleeding: Secondary | ICD-10-CM | POA: Diagnosis not present

## 2021-04-05 DIAGNOSIS — M7989 Other specified soft tissue disorders: Secondary | ICD-10-CM | POA: Diagnosis not present

## 2021-04-05 LAB — COMPREHENSIVE METABOLIC PANEL
ALT: 22 U/L (ref 0–44)
AST: 24 U/L (ref 15–41)
Albumin: 4 g/dL (ref 3.5–5.0)
Alkaline Phosphatase: 82 U/L (ref 38–126)
Anion gap: 9 (ref 5–15)
BUN: 23 mg/dL (ref 8–23)
CO2: 28 mmol/L (ref 22–32)
Calcium: 9.1 mg/dL (ref 8.9–10.3)
Chloride: 99 mmol/L (ref 98–111)
Creatinine, Ser: 1.04 mg/dL — ABNORMAL HIGH (ref 0.44–1.00)
GFR, Estimated: >60 mL/min (ref >60)
Glucose, Bld: 163 mg/dL — ABNORMAL HIGH (ref 70–99)
Potassium: 3.8 mmol/L (ref 3.5–5.1)
Sodium: 136 mmol/L (ref 135–145)
Total Bilirubin: 0.8 mg/dL (ref 0.3–1.2)
Total Protein: 7.5 g/dL (ref 6.5–8.1)

## 2021-04-05 LAB — URINALYSIS, ROUTINE W REFLEX MICROSCOPIC
Bacteria, UA: NONE SEEN
Bilirubin Urine: NEGATIVE
Glucose, UA: NEGATIVE mg/dL
Ketones, ur: NEGATIVE mg/dL
Leukocytes,Ua: NEGATIVE
Nitrite: NEGATIVE
Protein, ur: 30 mg/dL — AB
Specific Gravity, Urine: 1.017 (ref 1.005–1.030)
pH: 5 (ref 5.0–8.0)

## 2021-04-05 LAB — LACTIC ACID, PLASMA
Lactic Acid, Venous: 1.9 mmol/L (ref 0.5–1.9)
Lactic Acid, Venous: 1.1 mmol/L (ref 0.5–1.9)

## 2021-04-05 LAB — CBC WITH DIFFERENTIAL/PLATELET
Abs Immature Granulocytes: 0.07 10*3/uL (ref 0.00–0.07)
Basophils Absolute: 0.1 10*3/uL (ref 0.0–0.1)
Basophils Relative: 0 %
Eosinophils Absolute: 0.2 10*3/uL (ref 0.0–0.5)
Eosinophils Relative: 1 %
HCT: 35.2 % — ABNORMAL LOW (ref 36.0–46.0)
Hemoglobin: 11.5 g/dL — ABNORMAL LOW (ref 12.0–15.0)
Immature Granulocytes: 1 %
Lymphocytes Relative: 15 %
Lymphs Abs: 2 10*3/uL (ref 0.7–4.0)
MCH: 27.7 pg (ref 26.0–34.0)
MCHC: 32.7 g/dL (ref 30.0–36.0)
MCV: 84.8 fL (ref 80.0–100.0)
Monocytes Absolute: 1 10*3/uL (ref 0.1–1.0)
Monocytes Relative: 8 %
Neutro Abs: 9.6 10*3/uL — ABNORMAL HIGH (ref 1.7–7.7)
Neutrophils Relative %: 75 %
Platelets: 329 10*3/uL (ref 150–400)
RBC: 4.15 MIL/uL (ref 3.87–5.11)
RDW: 13 % (ref 11.5–15.5)
WBC: 12.8 10*3/uL — ABNORMAL HIGH (ref 4.0–10.5)
nRBC: 0 % (ref 0.0–0.2)

## 2021-04-05 LAB — RESP PANEL BY RT-PCR (FLU A&B, COVID) ARPGX2
Influenza A by PCR: NEGATIVE
Influenza B by PCR: NEGATIVE
SARS Coronavirus 2 by RT PCR: NEGATIVE

## 2021-04-05 LAB — PROCALCITONIN: Procalcitonin: <0.1 ng/mL

## 2021-04-05 LAB — LIPASE, BLOOD: Lipase: 24 U/L (ref 11–51)

## 2021-04-05 LAB — TROPONIN I (HIGH SENSITIVITY)
Troponin I (High Sensitivity): 12 ng/L (ref <18)
Troponin I (High Sensitivity): 7 ng/L (ref <18)

## 2021-04-05 MED ORDER — MORPHINE SULFATE (PF) 4 MG/ML IV SOLN
6.0000 mg | Freq: Once | INTRAVENOUS | Status: AC
  Administered 2021-04-05: 6 mg via INTRAVENOUS
  Filled 2021-04-05: qty 2

## 2021-04-05 MED ORDER — LACTATED RINGERS IV BOLUS
1000.0000 mL | Freq: Once | INTRAVENOUS | Status: AC
  Administered 2021-04-05: 1000 mL via INTRAVENOUS

## 2021-04-05 MED ORDER — IOHEXOL 350 MG/ML SOLN
100.0000 mL | Freq: Once | INTRAVENOUS | Status: AC | PRN
  Administered 2021-04-05: 100 mL via INTRAVENOUS

## 2021-04-05 MED ORDER — ONDANSETRON HCL 4 MG PO TABS
4.0000 mg | ORAL_TABLET | Freq: Three times a day (TID) | ORAL | 0 refills | Status: DC | PRN
  Filled 2021-04-05: qty 10, 4d supply, fill #0

## 2021-04-05 MED ORDER — ACETAMINOPHEN 500 MG PO TABS
1000.0000 mg | ORAL_TABLET | Freq: Once | ORAL | Status: AC
  Administered 2021-04-05: 1000 mg via ORAL
  Filled 2021-04-05: qty 2

## 2021-04-05 NOTE — ED Triage Notes (Signed)
Pt comes into the ED via POV c/o fever that started around last night.  Pt recently underwent procedure a week ago which was an angiogram and angioplasty.  They placed the stent in the left renal artery.  Pt c/o severe low back pain with worsening on the right.  Pt also c/o left side neck pain and generalized aching in the legs.  Pt denies any urinary changes thus far.  Pt currently has even and unlabored respirations and appears in NAD.

## 2021-04-05 NOTE — ED Notes (Signed)
Pt. Up top restroom, gait steady, NAD.

## 2021-04-05 NOTE — ED Provider Notes (Signed)
Emergency Medicine Provider Triage Evaluation Note  Courtney Lopez , a 60 y.o. female  was evaluated in triage.  Pt complains of fever, severe back pain.  Status post angiogram last week with stents to left renal artery.  Review of Systems  Positive: Fever, back pain, swelling and bruising to angiogram site right groin Negative: Chest pain, shortness of breath  Physical Exam  BP (!) 160/109 (BP Location: Right Arm)   Pulse (!) 101   Temp 100.2 F (37.9 C) (Oral)   Resp 18   Ht 5' 5.5" (1.664 m)   Wt 83.5 kg   LMP  (LMP Unknown)   SpO2 98%   BMI 30.15 kg/m  Gen:   Awake, mild distress   Resp:  Normal effort  MSK:   Moves extremities without difficulty  Other:  Significant bruising and swelling to right groin angiogram site  Medical Decision Making  Medically screening exam initiated at 5:39 AM.  Appropriate orders placed.  Courtney Lopez was informed that the remainder of the evaluation will be completed by another provider, this initial triage assessment does not replace that evaluation, and the importance of remaining in the ED until their evaluation is complete.  60 year old female who recently status post angiogram presenting with fever, back pain.  Postoperative site with extensive bruising and swelling.  Will obtain sepsis protocol lab work, urine, chest x-ray.  Obtain respiratory panel.  Obtain CT abdomen/pelvis to evaluate for retroperitoneal hematoma, right groin ultrasound to evaluate for pseudoaneurysm.  Patient awaiting treatment room.   Irean Hong, MD 04/05/21 [id number]

## 2021-04-05 NOTE — ED Provider Notes (Signed)
[facility] Emergency Department Provider Note  ____________________________________________   Event Date/Time   First MD Initiated Contact with Patient 04/05/21 0532     (approximate)  I have reviewed the triage vital signs and the nursing notes.   HISTORY  Chief Complaint Fever   HPI Courtney Lopez is a 60 y.o. female with PMH of renal artery stenosis, DM, carotid artery disease, moderate AS, FMD and AAA and renal artery stenosis s/p stenting on the L on 11/16 who presents for assessment of fairly sudden onset of some bilateral low back pain slightly worse on the left as well as some left-sided neck discomfort and nausea.  Patient states she has been doing fairly well since her procedure with some mild soreness and bruising in her right groin where the catheter was placed but otherwise has been able to return to work.  She states she measured a temperature of 100.5-100.6 yesterday.  She has not had any chest pain, cough but states she felt little short of breath earlier today.  No new headache, earache, sore throat, vomiting, diarrhea, burning with urination, blood in the stool, blood in the urine or new areas of extremity pain weakness numbness or bruising.  No injuries or falls.  No other acute concerns at this time.         Past Medical History:  Diagnosis Date   Ankle fracture 12/31/2018   left   Anxiety    Arthritis    neck, back, ankle, knees   Ascending aortic aneurysm    a. 04/2016 CTA: 4.6cm Asc TAA; b. 04/2018 CTA: 4.8cm; c. 10/2018 CTA: 4.9x4.8cm; d. 12/2019 Echo: 4.8 cm.   Bicuspid aortic valve    Breast screening, unspecified    Carotid arterial disease ([place])    a. 01/2020 U/S: 1-39% bilat ICA stenosis. No evidence of FMD.   Cervicalgia    cervical stenosis   Diabetes mellitus without complication (HCC)    Elevated coronary artery calcium score    a. 03/2016 Cardiac CT: Ca2+ = 78 (90th %'ile).   Fibromuscular dysplasia (HCC)     Heart murmur    History of hiatal hernia    Metatarsal fracture 2007   5th, from wearing high heels   Moderate aortic stenosis    a. 12/2019 Echo: EF 60-65%, no rwma, mild LVH, gr1 DD, nl RV size/fxn, triv MR, mild AI, mod AS, mod-sev dil of Asc Ao - 50m.  AoV reported as tricuspid but prev known to be bicuspid.   Multiple thyroid nodules    a. 10/2019 U/S: R mid thyroid nodule - f/u 5 yrs.   Other symptoms involving abdomen and pelvis(789.9)    Renal artery stenosis ([place]    a. 12/2019 Renal duplex: Nl RRA. LRA >60% stenosis.   Renal cyst, right    a. 12/2019 Duplex: 5.8 x 5.7 cm R renal cyst in lower pole.   Retinal hemorrhage    Symptomatic menopausal or female climacteric states    Unspecified essential hypertension    Wears contact lenses     Patient Active Problem List   Diagnosis Date Noted   RAS (renal artery stenosis) ([place] 03/28/2021   Ankle injury, sequela 06/07/2020   Personal history of COVID-19 05/28/2020   Diabetic nephropathy associated with type 2 diabetes mellitus ([place] 10/21/2019   Renal artery stenosis ([place] 10/27/2018   Hypertensive emergency    Thoracic aortic aneurysm without rupture    Chest pain 03/10/2018   Paroxysmal tachycardia ([place] 08/12/2017  Thyroid nodule 05/15/2017   Ascending aorta dilatation ([place]) 05/27/2016   Bicuspid aortic valve 05/27/2016   Encounter for preventive health examination 10/29/2015   Adjustment disorder with anxious mood 06/13/2014   Diabetes mellitus type 2 in obese ([place]) 03/25/2012   Aortic stenosis    Screening for colon cancer 08/25/2011   Screening for breast cancer 08/25/2011   Overweight 08/25/2011   Hyperlipidemia associated with type 2 diabetes mellitus ([place]) 08/25/2011   Cervical spine disease 06/18/2011   Hypertension 06/18/2011    Past Surgical History:  Procedure Laterality Date   ABDOMINAL HYSTERECTOMY     ANKLE ARTHROSCOPY WITH RECONSTRUCTION Left 08/24/2020   Procedure: LEFT ANKLE ARTHROSCOPIC  DEBRIDEMENT, EXTENSIVE, ARTHOSCROPIC TREATMENT OF TALUS OSTEOCHONDRAL LESION, LATERAL LIGAMENT RECONSTRUCTION;  Surgeon: Erle Crocker, MD;  Location: [place];  Service: Orthopedics;  Laterality: Left;   APPENDECTOMY     done during oophorectomy   CESAREAN SECTION     x 2   ETHMOIDECTOMY Left 03/02/2020   Procedure: ETHMOIDECTOMY;  Surgeon: Margaretha Sheffield, MD;  Location: [place];  Service: ENT;  Laterality: Left;   gyn surgery     hysterectomy- done for adenomysosis and endometriosis   IMAGE GUIDED SINUS SURGERY N/A 03/02/2020   Procedure: IMAGE GUIDED SINUS SURGERY;  Surgeon: Margaretha Sheffield, MD;  Location: [place];  Service: ENT;  Laterality: N/A;  need disk PLACED DISK ON OR CHARGE NURSE DESK 9-17 KP   IMAGE GUIDED SINUS SURGERY Left 04/13/2020   Procedure: IMAGE GUIDED SINUS SURGERY;  Surgeon: Margaretha Sheffield, MD;  Location: [place];  Service: ENT;  Laterality: Left;  needs stryker disk per Justice Rocher use same disk as before (October) put on charge nurses desk. DS 04/03/20   MAXILLARY ANTROSTOMY Left 03/02/2020   Procedure: MAXILLARY ANTROSTOMY;  Surgeon: Margaretha Sheffield, MD;  Location: [place];  Service: ENT;  Laterality: Left;   MAXILLARY ANTROSTOMY Left 04/13/2020   Procedure: MAXILLECTOMY;  Surgeon: Margaretha Sheffield, MD;  Location: [place];  Service: ENT;  Laterality: Left;   OOPHORECTOMY     unilateral, due to ruptured ovarian cysts   OSTEOTOMY MAXILLARY     due to bite problems   PERIPHERAL VASCULAR INTERVENTION  03/28/2021   Procedure: PERIPHERAL VASCULAR INTERVENTION;  Surgeon: Wellington Hampshire, MD;  Location: [place] [facility];  Service: Cardiovascular;;   RENAL ANGIOGRAPHY N/A 03/28/2021   Procedure: RENAL ANGIOGRAPHY;  Surgeon: Wellington Hampshire, MD;  Location: [place] [facility];  Service: Cardiovascular;  Laterality: N/A;   TUBAL LIGATION      Prior to Admission medications   Medication Sig Start  Date End Date Taking? Authorizing Provider  ondansetron (ZOFRAN) 4 MG tablet Take 1 tablet (4 mg total) by mouth every 8 (eight) hours as needed for up to 10 doses for nausea or vomiting. 04/05/21  Yes Lucrezia Starch, MD  ALPRAZolam Duanne Moron) 0.25 MG tablet TAKE 1 TABLET BY MOUTH TWICE DAILY AS NEEDED 03/27/21 09/23/21  Crecencio Mc, MD  aspirin EC 81 MG tablet Take 1 tablet (81 mg total) by mouth daily. Swallow whole. 03/23/21   Wellington Hampshire, MD  carvedilol (COREG) 25 MG tablet Take one tablet  by mouth 2 (two) times daily with a meal. 03/27/21 03/27/22  Crecencio Mc, MD  clopidogrel (PLAVIX) 75 MG tablet Take 1 tablet (75 mg total) by mouth daily with breakfast. 03/29/21   Cheryln Manly, NP  estradiol (ESTRACE) 0.1 MG/GM vaginal cream INSERT 1/4 APPLICATORFUL  VAGINALLY TWO TIMES A WEEK AS DIRECTED Patient taking differently: Place 5.74 Applicatorfuls vaginally 2 (two) times a week. Sundays & Thursdays 11/10/20 11/10/21  Crecencio Mc, MD  fluticasone (FLONASE) 50 MCG/ACT nasal spray PLACE 2 SPRAYS INTO BOTH NOSTRILS DAILY. Patient taking differently: Place 2 sprays into both nostrils every evening. 12/12/20 12/12/21  Crecencio Mc, MD  hydrALAZINE (APRESOLINE) 25 MG tablet Take 1 tablet (25 mg total) by mouth 3 (three) times daily. 01/03/21   Minna Merritts, MD  HYDROcodone-acetaminophen (NORCO) 10-325 MG tablet TAKE 1 TABLET BY MOUTH EVERY 6 (SIX) HOURS AS NEEDED FOR SEVERE PAIN. 03/27/21 09/23/21  Crecencio Mc, MD  losartan (COZAAR) 100 MG tablet TAKE 1 TABLET (100 MG TOTAL) BY MOUTH DAILY. 03/27/21 03/27/22  Crecencio Mc, MD  Menthol, Topical Analgesic, (BIOFREEZE EX) Apply 1 application topically at bedtime. Applied to neck area    [provider]  metFORMIN (GLUCOPHAGE-XR) 750 MG 24 hr tablet Take 1 tablet by mouth daily with breakfast. 02/27/21 02/27/22  Crecencio Mc, MD  rosuvastatin (CRESTOR) 5 MG tablet Take 1 tablet (5 mg total) by mouth at bedtime. 02/01/20    Wellington Hampshire, MD  sodium fluoride (SF 5000 PLUS) 1.1 % CREA dental cream Brush teeth thoroughly for 2 minutes, do not rinse afterwards. 04/03/21     traMADol (ULTRAM) 50 MG tablet TAKE 2 TABLETS (100 MG TOTAL) BY MOUTH EVERY 6 (SIX) HOURS AS NEEDED. 12/14/20   Crecencio Mc, MD  spironolactone (ALDACTONE) 25 MG tablet Take 1 tablet (25 mg total) by mouth daily. 04/19/20 05/22/20  Wellington Hampshire, MD    Allergies Hctz [hydrochlorothiazide] and Prozac [fluoxetine hcl]  Family History  Problem Relation Age of Onset   Diabetes Mother    Aortic stenosis Mother    Cancer Father        leukemia   Retinoblastoma Son    Melanoma Son    Cancer Maternal Aunt        breast   Breast cancer Maternal Aunt 59   Cancer Grandchild 1       retinoblastoma   Cancer Granddaughter     Social History Social History   Tobacco Use   Smoking status: Never   Smokeless tobacco: Never  Vaping Use   Vaping Use: Never used  Substance Use Topics   Alcohol use: Yes    Comment: rare   Drug use: No    Review of Systems  Review of Systems  Constitutional:  Positive for fever. Negative for chills.  HENT:  Negative for sore throat.   Eyes:  Negative for pain.  Respiratory:  Negative for cough and stridor.   Cardiovascular:  Negative for chest pain.  Gastrointestinal:  Positive for nausea. Negative for vomiting.  Musculoskeletal:  Positive for back pain, myalgias and neck pain.  Skin:  Negative for rash.  Neurological:  Negative for seizures, loss of consciousness and headaches.  Endo/Heme/Allergies:  Bruises/bleeds easily.  Psychiatric/Behavioral:  Negative for suicidal ideas.   All other systems reviewed and are negative.    ____________________________________________   PHYSICAL EXAM:  VITAL SIGNS: ED Triage Vitals  Enc Vitals Group     BP 04/05/21 0533 (!) 160/109     Pulse Rate 04/05/21 0533 (!) 101     Resp 04/05/21 0533 18     Temp 04/05/21 0533 100.2 F (37.9 C)     Temp  Source 04/05/21 0533 Oral     SpO2 04/05/21 0533 98 %  Weight 04/05/21 0534 184 lb (83.5 kg)     Height 04/05/21 0534 5' 5.5" (1.664 m)     Head Circumference --      Peak Flow --      Pain Score 04/05/21 0533 9     Pain Loc --      Pain Edu? --      Excl. in [place]? --    Vitals:   04/05/21 1112 04/05/21 1200  BP: (!) 147/81 (!) 155/83  Pulse: 81 85  Resp: 20 18  Temp: 98.8 F (37.1 C)   SpO2: 95% 95%   Physical Exam Vitals and nursing note reviewed.  Constitutional:      General: She is not in acute distress.    Appearance: She is well-developed.  HENT:     Head: Normocephalic and atraumatic.     Right Ear: External ear normal.     Left Ear: External ear normal.     Nose: Nose normal.     Mouth/Throat:     Mouth: Mucous membranes are dry.  Eyes:     Conjunctiva/sclera: Conjunctivae normal.  Cardiovascular:     Rate and Rhythm: Normal rate and regular rhythm.     Heart sounds: No murmur heard. Pulmonary:     Effort: Pulmonary effort is normal. No respiratory distress.     Breath sounds: Normal breath sounds.  Abdominal:     Palpations: Abdomen is soft.     Tenderness: There is no abdominal tenderness. There is right CVA tenderness and left CVA tenderness.  Musculoskeletal:        General: No swelling.     Cervical back: Neck supple.  Skin:    General: Skin is warm and dry.     Capillary Refill: Capillary refill takes 2 to 3 seconds.  Neurological:     Mental Status: She is alert and oriented to person, place, and time.  Psychiatric:        Mood and Affect: Mood normal.    Cranial nerves II through XII are grossly intact.  Some mild tenderness of left trapezius without any overlying skin changes.  There is no midline back pain.  Patient has full strength of her bilateral upper and lower extremities.  Sensation is intact to light touch lower extremities.  2+ radial pulses.  There is some ecchymosis in the right groin without any active bleeding, induration,  purulence or obvious cellulitis. ____________________________________________   LABS (all labs ordered are listed, but only abnormal results are displayed)  Labs Reviewed  CBC WITH DIFFERENTIAL/PLATELET - Abnormal; Notable for the following components:      Result Value   WBC 12.8 (*)    Hemoglobin 11.5 (*)    HCT 35.2 (*)    Neutro Abs 9.6 (*)    All other components within normal limits  COMPREHENSIVE METABOLIC PANEL - Abnormal; Notable for the following components:   Glucose, Bld 163 (*)    Creatinine, Ser 1.04 (*)    All other components within normal limits  URINALYSIS, ROUTINE W REFLEX MICROSCOPIC - Abnormal; Notable for the following components:   Color, Urine YELLOW (*)    APPearance CLEAR (*)    Hgb urine dipstick SMALL (*)    Protein, ur 30 (*)    All other components within normal limits  RESP PANEL BY RT-PCR (FLU A&B, COVID) ARPGX2  CULTURE, BLOOD (ROUTINE X 2)  CULTURE, BLOOD (ROUTINE X 2)  URINE CULTURE  LACTIC ACID, PLASMA  LACTIC ACID, PLASMA  LIPASE, BLOOD  PROCALCITONIN  TROPONIN I (HIGH SENSITIVITY)  TROPONIN I (HIGH SENSITIVITY)   ____________________________________________  EKG  ECG remarkable for sinus rhythm with PAC and left anterior fascicle block without evidence of acute ischemia or significant arrhythmia. ____________________________________________  RADIOLOGY  ED MD interpretation:    Chest x-ray without focal consolidation, effusion, edema, pneumothorax or other clear acute thoracic process.  CT abdomen pelvis without contrast shows no evidence of hemorrhage, diverticulitis, or other acute process.  There is some atrophy of the left kidney and punctate nonobstructing left-sided kidney stone.  Ultrasound of the right lower extremity shows a 2.5 cm complex fluid collection superficial to common femoral vessel consistent with a hematoma.  There is no evidence of pseudoaneurysm.  CTA chest and pelvis shows no evidence of dissection or  other clear acute process.  There is no evidence of large PE.  There is a stable dilation of the ascending aorta and left renal atrophy with patent left renal artery stent in place.  There is also evidence of diverticulosis without diverticulitis and a small hiatal hernia.  There is no evidence of perinephric stranding, hydronephrosis diverticulitis or other acute abdominal or thoracic process.  Official radiology report(s): CT Abdomen Pelvis Wo Contrast  Result Date: 04/05/2021 CLINICAL DATA:  Severe lower back pain and fever. Recent stenting of the left renal artery. Evaluate for retroperitoneal hematoma. EXAM: CT ABDOMEN AND PELVIS WITHOUT CONTRAST TECHNIQUE: Multidetector CT imaging of the abdomen and pelvis was performed following the standard protocol without IV contrast. COMPARISON:  CTA chest, abdomen, and pelvis dated October 21, 2018. FINDINGS: Lower chest: No acute abnormality. Hepatobiliary: No focal liver abnormality is seen. No gallstones, gallbladder wall thickening, or biliary dilatation. Pancreas: Unremarkable. No pancreatic ductal dilatation or surrounding inflammatory changes. Spleen: Normal in size without focal abnormality. Adrenals/Urinary Tract: The adrenal glands are unremarkable. Interval left renal atrophy since prior study from June 2020. Punctate left renal calculus. 6.1 cm right renal simple cyst, previously 6.6 cm. No ureteral calculi or hydronephrosis. The bladder is unremarkable. Stomach/Bowel: Small hiatal hernia. The stomach is otherwise within normal limits. No bowel wall thickening, distention, or surrounding inflammatory changes. Colonic diverticulosis. Prior appendectomy. Vascular/Lymphatic: No significant vascular findings are present. Left proximal renal artery stent. No enlarged abdominal or pelvic lymph nodes. Reproductive: Status post hysterectomy. No adnexal masses. Other: No abdominal wall hernia or abnormality. No abdominopelvic ascites. No pneumoperitoneum.  Musculoskeletal: Mild stranding in the right groin overlying the common femoral vessels, likely related to recent angiogram. No hematoma. No acute or significant osseous findings. IMPRESSION: 1. No acute intra-abdominal process. No retroperitoneal hematoma. 2. Interval left renal atrophy since prior study from June 2020. Punctate nonobstructive left nephrolithiasis. Electronically Signed   By: Titus Dubin M.D.   On: 04/05/2021 06:22   DG Chest 2 View  Result Date: 04/05/2021 CLINICAL DATA:  Fever and back pain EXAM: CHEST - 2 VIEW COMPARISON:  12/19/2020 CT FINDINGS: The heart size and mediastinal contours are within normal limits. Both lungs are clear. The visualized skeletal structures are unremarkable. Degenerative endplate spurring IMPRESSION: No active cardiopulmonary disease. Electronically Signed   By: Jorje Guild M.D.   On: 04/05/2021 06:21   [place] Lower Ext Art Right Ltd  Result Date: 04/05/2021 CLINICAL DATA:  Right groin swelling and bruising after angiogram last week. EXAM: RIGHT LOWER EXTREMITY ARTERIAL DUPLEX SCAN TECHNIQUE: Gray-scale sonography as well as color Doppler and duplex ultrasound was performed to evaluate the right lower extremity common femoral artery and vein. COMPARISON:  None. FINDINGS: Focused  ultrasound of the right groin demonstrates normal common femoral artery and vein. No evidence of pseudoaneurysm. There is a 2.1 x 0.5 x 2.5 cm complex hypoechoic fluid collection superficial to the common femoral vessels. IMPRESSION: 1. 2.5 cm complex fluid collection superficial to the common femoral vessels in the right groin most consistent with hematoma. No evidence of pseudoaneurysm. Electronically Signed   By: Titus Dubin M.D.   On: 04/05/2021 07:32   CT Angio Chest/Abd/Pel for Dissection W and/or Wo Contrast  Result Date: 04/05/2021 CLINICAL DATA:  Abdominal pain, aortic dissection suspected. EXAM: CT ANGIOGRAPHY CHEST, ABDOMEN AND PELVIS TECHNIQUE: Non-contrast CT  of the chest was initially obtained. Multidetector CT imaging through the chest, abdomen and pelvis was performed using the standard protocol during bolus administration of intravenous contrast. Multiplanar reconstructed images and MIPs were obtained and reviewed to evaluate the vascular anatomy. CONTRAST:  149m OMNIPAQUE IOHEXOL 350 MG/ML SOLN COMPARISON:  CT abdomen and pelvis earlier the same day, CTA chest 12/19/2020 FINDINGS: CTA CHEST FINDINGS Cardiovascular: Heart size is normal. No pericardial effusion identified. Main pulmonary artery is normal caliber. Stable dilatation of the ascending aorta measuring 4.9 cm in diameter. No intramural hematoma or dissection identified in the thoracic aorta. Mild atherosclerotic plaques. Mediastinum/Nodes: No enlarged mediastinal, hilar, or axillary lymph nodes. Thyroid gland, trachea, and esophagus demonstrate no significant findings. Lungs/Pleura: Lungs are clear. No pleural effusion or pneumothorax. Musculoskeletal: Degenerative changes in the thoracic spine. No suspicious bony lesions identified. Review of the MIP images confirms the above findings. CTA ABDOMEN AND PELVIS FINDINGS VASCULAR Aorta: Normal caliber aorta without aneurysm, dissection, vasculitis or significant stenosis. Celiac: Patent without significant stenosis. Note that the splenic artery originates from the aorta and is patent and normal caliber. SMA: Patent without evidence of aneurysm, dissection, vasculitis or significant stenosis. Renals: Mild atherosclerotic plaques at the proximal right renal artery without significant stenosis. There is a stent in the proximal left renal artery which appears patent with opacification distally. IMA: Patent and normal caliber. Inflow: Patent without evidence of aneurysm, dissection, vasculitis or significant stenosis. Veins: No obvious venous abnormality within the limitations of this arterial phase study. Review of the MIP images confirms the above findings.  NON-VASCULAR Hepatobiliary: No focal liver abnormality is seen. No gallstones, gallbladder wall thickening, or biliary dilatation. Pancreas: Unremarkable. No pancreatic ductal dilatation or surrounding inflammatory changes. Spleen: Normal in size without focal abnormality. Adrenals/Urinary Tract: Adrenal glands are within normal limits. Left renal atrophy. Symmetric perfusion of the kidneys. 6 cm cyst in the lower pole the right kidney. No hydronephrosis identified bilaterally. Urinary bladder appears within normal limits. Stomach/Bowel: Small hiatal hernia. No bowel obstruction, free air or pneumatosis. Colonic diverticulosis. No bowel wall edema identified. Lymphatic: No bulky lymphadenopathy identified. Reproductive: Status post hysterectomy. No adnexal masses. Other: No ascites visualized. Musculoskeletal: Degenerative changes in the lumbar spine. No suspicious bony lesions identified. Review of the MIP images confirms the above findings. IMPRESSION: 1. No acute aortic syndrome identified. 2. Stable dilatation of the ascending aorta measuring 4.9 cm diameter. Ascending thoracic aortic aneurysm. Recommend semi-annual imaging followup by CTA or MRA and referral to cardiothoracic surgery if not already obtained. This recommendation follows 2010 ACCF/AHA/AATS/ACR/ASA/SCA/SCAI/SIR/STS/SVM Guidelines for the Diagnosis and Management of Patients With Thoracic Aortic Disease. Circulation. 2010; 121:: [id number] Aortic aneurysm NOS (ICD10-I71.9) 3. Left renal atrophy with patent left renal artery stent. 4. Colonic diverticulosis. 5. Small hiatal hernia. Electronically Signed   By: DOfilia NeasM.D.   On: 04/05/2021 12:10    ____________________________________________  PROCEDURES  Procedure(s) performed (including Critical Care):  Procedures   ____________________________________________   INITIAL IMPRESSION / ASSESSMENT AND PLAN / ED COURSE      Patient presents with above-stated history exam for  assessment of some fevers and acute low back pain as well as left-sided neck pain after recent renal artery stenting.  On arrival patient is slight tachycardic at 101 with a temperature of 100.2 and a BP of 160/109 with otherwise stable vital signs on room air.  She does have some mild bilateral CVA tenderness and left-sided trapezius tenderness as well as ecchymosis in the right groin.  Differential includes dissection, pyelonephritis, kidney stone, diverticulitis, atypical presentation for ACS, pneumonia, PE, metabolic derangements, anemia possible cystitis.  Patient does not appear meningitic and I will order suspicion for meningitis at this time.  EKG and nonelevated troponin x2 not suggestive of atypical presentation for ACS or myocarditis.  Chest x-ray without focal consolidation, effusion, edema, pneumothorax or other clear acute thoracic process.  CT abdomen pelvis without contrast shows no evidence of hemorrhage, diverticulitis, or other acute process.  There is some atrophy of the left kidney and punctate nonobstructing left-sided kidney stone.  Ultrasound of the right lower extremity shows a 2.5 cm complex fluid collection superficial to common femoral vessel consistent with a hematoma.  There is no evidence of pseudoaneurysm.  CTA chest and pelvis shows no evidence of dissection or other clear acute process.  There is no evidence of large PE.  There is a stable dilation of the ascending aorta and left renal atrophy with patent left renal artery stent in place.  There is also evidence of diverticulosis without diverticulitis and a small hiatal hernia.  There is no evidence of perinephric stranding, hydronephrosis diverticulitis or other acute abdominal or thoracic process.   CMP without significant electrolyte metabolic derangements aside from slightly elevated glucose.  There is no evidence of hepatitis or cholestasis.  Lactic acid is nonelevated.  COVID and influenza PCR is negative.  CBC  shows leukocytosis with WBC count of 12.8 with hemoglobin of 11.5 and normal platelets.  Lipase not consistent with pancreatitis.  Procalcitonin undetectable.  UA with some protein and small hemoglobin but no evidence of infection or WBCs.  Overall unclear etiology of recent symptoms although is possible this is a early viral illness versus possible very early bacterial infection although there is no source identified on exam or work-up.  Patient initially met SIRS criteria with tachycardia and leukocytosis on my reassessment her heart rate is 85 and she states she is feeling much better.  I do not believe she is septic at this time.  Patient patient is feeling much better with resolution of her tachycardia and no measured temperatures greater than 100.4 here and with no obvious source of infection or evidence of acute complication from recent procedure I think she is stable for discharge with close outpatient follow-up.  Had extensive discussion and offered admission or patient states she feels comfortable going home and will return immediately if she experiences any new or worsening of her symptoms.  Recommend continuing her analgesia at home and using Tylenol every 6 hours.  He will follow-up with PCP and cardiology.  Discharged in stable condition.   ____________________________________________   FINAL CLINICAL IMPRESSION(S) / ED DIAGNOSES  Final diagnoses:  Acute bilateral low back pain, unspecified whether sciatica present  Neck pain  Bruising  Fever, unspecified fever cause    Medications  acetaminophen (TYLENOL) tablet 1,000 mg (has no administration in time  range)  iohexol (OMNIPAQUE) 350 MG/ML injection 100 mL (100 mLs Intravenous Contrast Given 04/05/21 1130)  morphine 4 MG/ML injection 6 mg (6 mg Intravenous Given 04/05/21 1158)  lactated ringers bolus 1,000 mL (1,000 mLs Intravenous New Bag/Given 04/05/21 1157)     ED Discharge Orders          Ordered    ondansetron (ZOFRAN)  4 MG tablet  Every 8 hours PRN        04/05/21 1234             Note:  This document was prepared using Dragon voice recognition software and may include unintentional dictation errors.    Lucrezia Starch, MD 04/05/21 1240

## 2021-04-06 ENCOUNTER — Other Ambulatory Visit: Payer: Self-pay

## 2021-04-06 LAB — URINE CULTURE: Culture: <10000 — AB

## 2021-04-09 ENCOUNTER — Ambulatory Visit: Payer: 59

## 2021-04-10 ENCOUNTER — Other Ambulatory Visit: Payer: Self-pay

## 2021-04-10 LAB — CULTURE, BLOOD (ROUTINE X 2)
Culture: NO GROWTH
Culture: NO GROWTH

## 2021-04-10 MED FILL — Tramadol HCl Tab 50 MG: ORAL | 30 days supply | Qty: 240 | Fill #3 | Status: AC

## 2021-04-13 ENCOUNTER — Other Ambulatory Visit (HOSPITAL_COMMUNITY): Payer: Self-pay

## 2021-04-13 ENCOUNTER — Telehealth (HOSPITAL_COMMUNITY): Payer: Self-pay

## 2021-04-13 NOTE — Telephone Encounter (Signed)
Transitions of Care Pharmacy  ° °Call attempted for a pharmacy transitions of care follow-up. HIPAA appropriate voicemail was left with call back information provided.  ° °Call attempt #1. Will follow-up in 2-3 days.  °  °

## 2021-04-16 ENCOUNTER — Telehealth (HOSPITAL_COMMUNITY): Payer: Self-pay

## 2021-04-16 NOTE — Telephone Encounter (Signed)
Transitions of Care Pharmacy   Call attempted for a pharmacy transitions of care follow-up. HIPAA appropriate voicemail was left with call back information provided.   Call attempt #2. Will follow-up in 2-3 days.    

## 2021-04-20 ENCOUNTER — Other Ambulatory Visit: Payer: Self-pay

## 2021-04-20 ENCOUNTER — Encounter: Payer: Self-pay | Admitting: Cardiovascular Disease

## 2021-04-20 ENCOUNTER — Ambulatory Visit (INDEPENDENT_AMBULATORY_CARE_PROVIDER_SITE_OTHER): Payer: 59

## 2021-04-20 ENCOUNTER — Other Ambulatory Visit (HOSPITAL_COMMUNITY): Payer: Self-pay

## 2021-04-20 ENCOUNTER — Ambulatory Visit (INDEPENDENT_AMBULATORY_CARE_PROVIDER_SITE_OTHER): Payer: 59 | Admitting: Cardiovascular Disease

## 2021-04-20 ENCOUNTER — Ambulatory Visit
Admission: RE | Admit: 2021-04-20 | Discharge: 2021-04-20 | Disposition: A | Discharge status: Home / Self Care | Payer: 59 | Source: Ambulatory Visit | Attending: Internal Medicine | Admitting: Internal Medicine

## 2021-04-20 VITALS — BP 144/80 | HR 79 | Ht 65.5 in | Wt 189.5 lb

## 2021-04-20 DIAGNOSIS — Q231 Congenital insufficiency of aortic valve: Secondary | ICD-10-CM

## 2021-04-20 DIAGNOSIS — E785 Hyperlipidemia, unspecified: Secondary | ICD-10-CM

## 2021-04-20 DIAGNOSIS — Z23 Encounter for immunization: Secondary | ICD-10-CM | POA: Diagnosis not present

## 2021-04-20 DIAGNOSIS — I701 Atherosclerosis of renal artery: Secondary | ICD-10-CM | POA: Diagnosis not present

## 2021-04-20 DIAGNOSIS — E041 Nontoxic single thyroid nodule: Secondary | ICD-10-CM | POA: Insufficient documentation

## 2021-04-20 DIAGNOSIS — I7121 Aneurysm of the ascending aorta, without rupture: Secondary | ICD-10-CM | POA: Diagnosis not present

## 2021-04-20 MED ORDER — ROSUVASTATIN CALCIUM 5 MG PO TABS
5.0000 mg | ORAL_TABLET | Freq: Every day | ORAL | 2 refills | Status: DC
  Filled 2021-04-20: qty 90, 90d supply, fill #0

## 2021-04-20 NOTE — Progress Notes (Signed)
Patient presented for Shingrix and prevnar 20 injection to left and right deltoid, patient voiced no concerns nor showed any signs of distress during injection.

## 2021-04-20 NOTE — Progress Notes (Signed)
Cardiology Office Note   Date:  04/20/2021   ID:  Courtney Lopez, DOB Jul 09, 1960, MRN [id number]  PCP:  Sherlene Shams, MD  Cardiologist:  Dr. Mariah Milling  Chief Complaint  Patient presents with   Other    1 Month f/u c/o fluctuating BP. Meds reviewed verbally with pt.       History of Present Illness: Courtney Lopez is a 60 y.o. female who is here today for a follow-up visit regarding renal artery stenosis.  She has known history of bicuspid aortic valve with ascending aortic aneurysm, renal artery stenosis, hyperlipidemia and type 2 diabetes. She had CTA of the aorta in 2020 which showed evidence of significant left renal artery stenosis with lack of significant calcifications. This was suggestive of fibromuscular dysplasia. Left renal artery stenosis was confirmed with renal artery duplex. Repeat renal artery duplex in 2021 showed significant stenosis in the mid left renal artery with peak velocity of 439.  Carotid Doppler showed no significant disease.   Blood pressure was initially controlled with medications with stable renal function and thus renal artery angiography was not pursued.   However, she was seen recently for refractory hypertension with evidence of significant decrease in left kidney size. I proceeded with angiography last month which showed 95% stenosis in the proximal left renal artery with no significant disease affecting the right renal artery.  I performed successful stent placement to the left renal artery without complications. Blood pressure improved after that but she started having severe back pain, body aches and fever 1 week after the procedure.  She also had bruising in the right groin.  Thus, she went to the emergency room.  CT scan of the abdomen showed no evidence of acute abnormalities or aortic dissection.  Labs were overall unremarkable she subsequently improved without intervention.  She feels back to baseline and she is doing well now.   Her blood pressure has improved but does go up when she is working.  Past Medical History:  Diagnosis Date   Ankle fracture 12/31/2018   left   Anxiety    Arthritis    neck, back, ankle, knees   Ascending aortic aneurysm    a. 04/2016 CTA: 4.6cm Asc TAA; b. 04/2018 CTA: 4.8cm; c. 10/2018 CTA: 4.9x4.8cm; d. 12/2019 Echo: 4.8 cm.   Bicuspid aortic valve    Breast screening, unspecified    Carotid arterial disease (HCC)    a. 01/2020 U/S: 1-39% bilat ICA stenosis. No evidence of FMD.   Cervicalgia    cervical stenosis   Diabetes mellitus without complication (HCC)    Elevated coronary artery calcium score    a. 03/2016 Cardiac CT: Ca2+ = 78 (90th %'ile).   Fibromuscular dysplasia (HCC)    Heart murmur    History of hiatal hernia    Metatarsal fracture 2007   5th, from wearing high heels   Moderate aortic stenosis    a. 12/2019 Echo: EF 60-65%, no rwma, mild LVH, gr1 DD, nl RV size/fxn, triv MR, mild AI, mod AS, mod-sev dil of Asc Ao - 48mm.  AoV reported as tricuspid but prev known to be bicuspid.   Multiple thyroid nodules    a. 10/2019 U/S: R mid thyroid nodule - f/u 5 yrs.   Other symptoms involving abdomen and pelvis(789.9)    Renal artery stenosis (HCC)    a. 12/2019 Renal duplex: Nl RRA. LRA >60% stenosis.   Renal cyst, right    a. 12/2019 Duplex: 5.8 x 5.7  cm R renal cyst in lower pole.   Retinal hemorrhage    Symptomatic menopausal or female climacteric states    Unspecified essential hypertension    Wears contact lenses     Past Surgical History:  Procedure Laterality Date   ABDOMINAL HYSTERECTOMY     ANKLE ARTHROSCOPY WITH RECONSTRUCTION Left 08/24/2020   Procedure: LEFT ANKLE ARTHROSCOPIC DEBRIDEMENT, EXTENSIVE, ARTHOSCROPIC TREATMENT OF TALUS OSTEOCHONDRAL LESION, LATERAL LIGAMENT RECONSTRUCTION;  Surgeon: Terance Hart, MD;  Location: [facility];  Service: Orthopedics;  Laterality: Left;   APPENDECTOMY     done during oophorectomy   CESAREAN  SECTION     x 2   ETHMOIDECTOMY Left 03/02/2020   Procedure: ETHMOIDECTOMY;  Surgeon: Vernie Murders, MD;  Location: [facility];  Service: ENT;  Laterality: Left;   gyn surgery     hysterectomy- done for adenomysosis and endometriosis   IMAGE GUIDED SINUS SURGERY N/A 03/02/2020   Procedure: IMAGE GUIDED SINUS SURGERY;  Surgeon: Vernie Murders, MD;  Location: [facility];  Service: ENT;  Laterality: N/A;  need disk PLACED DISK ON OR CHARGE NURSE DESK 9-17 KP   IMAGE GUIDED SINUS SURGERY Left 04/13/2020   Procedure: IMAGE GUIDED SINUS SURGERY;  Surgeon: Vernie Murders, MD;  Location: [facility];  Service: ENT;  Laterality: Left;  needs [employer] disk per Gloriajean Dell use same disk as before (October) put on charge nurses desk. DS 04/03/20   MAXILLARY ANTROSTOMY Left 03/02/2020   Procedure: MAXILLARY ANTROSTOMY;  Surgeon: Vernie Murders, MD;  Location: [facility];  Service: ENT;  Laterality: Left;   MAXILLARY ANTROSTOMY Left 04/13/2020   Procedure: MAXILLECTOMY;  Surgeon: Vernie Murders, MD;  Location: [facility];  Service: ENT;  Laterality: Left;   OOPHORECTOMY     unilateral, due to ruptured ovarian cysts   OSTEOTOMY MAXILLARY     due to bite problems   PERIPHERAL VASCULAR INTERVENTION  03/28/2021   Procedure: PERIPHERAL VASCULAR INTERVENTION;  Surgeon: Iran Ouch, MD;  Location: [facility];  Service: Cardiovascular;;   RENAL ANGIOGRAPHY N/A 03/28/2021   Procedure: RENAL ANGIOGRAPHY;  Surgeon: Iran Ouch, MD;  Location: [facility];  Service: Cardiovascular;  Laterality: N/A;   TUBAL LIGATION       Current Outpatient Medications  Medication Sig Dispense Refill   ALPRAZolam (XANAX) 0.25 MG tablet TAKE 1 TABLET BY MOUTH TWICE DAILY AS NEEDED 60 tablet 5   aspirin EC 81 MG tablet Take 1 tablet (81 mg total) by mouth daily. Swallow whole.     carvedilol (COREG) 25 MG tablet Take one tablet  by mouth 2 (two) times daily with a meal.  180 tablet 1   clopidogrel (PLAVIX) 75 MG tablet Take 1 tablet (75 mg total) by mouth daily with breakfast. 30 tablet 0   estradiol (ESTRACE) 0.1 MG/GM vaginal cream INSERT 1/4 APPLICATORFUL VAGINALLY TWO TIMES A WEEK AS DIRECTED (Patient taking differently: Place 0.25 Applicatorfuls vaginally 2 (two) times a week. Sundays & Thursdays) 42.5 g 5   fluticasone (FLONASE) 50 MCG/ACT nasal spray PLACE 2 SPRAYS INTO BOTH NOSTRILS DAILY. (Patient taking differently: Place 2 sprays into both nostrils every evening.) 16 g 2   hydrALAZINE (APRESOLINE) 25 MG tablet Take 1 tablet (25 mg total) by mouth 3 (three) times daily. 270 tablet 3   HYDROcodone-acetaminophen (NORCO) 10-325 MG tablet TAKE 1 TABLET BY MOUTH EVERY 6 (SIX) HOURS AS NEEDED FOR SEVERE PAIN. 30 tablet 0   losartan (COZAAR) 100 MG tablet  TAKE 1 TABLET (100 MG TOTAL) BY MOUTH DAILY. 30 tablet 0   Menthol, Topical Analgesic, (BIOFREEZE EX) Apply 1 application topically at bedtime. Applied to neck area     metFORMIN (GLUCOPHAGE-XR) 750 MG 24 hr tablet Take 1 tablet by mouth daily with breakfast. 90 tablet 0   ondansetron (ZOFRAN) 4 MG tablet Take 1 tablet (4 mg total) by mouth every 8 (eight) hours as needed for up to 10 doses for nausea or vomiting. 10 tablet 0   sodium fluoride (SF 5000 PLUS) 1.1 % CREA dental cream Brush teeth thoroughly for 2 minutes, do not rinse afterwards. 51 g 12   traMADol (ULTRAM) 50 MG tablet TAKE 2 TABLETS (100 MG TOTAL) BY MOUTH EVERY 6 (SIX) HOURS AS NEEDED. 240 tablet 5   rosuvastatin (CRESTOR) 5 MG tablet Take 1 tablet (5 mg total) by mouth at bedtime. 90 tablet 2   No current facility-administered medications for this visit.    Allergies:   Hctz [hydrochlorothiazide] and Prozac [fluoxetine hcl]    Social History:  The patient  reports that she has never smoked. She has never used smokeless tobacco. She reports current alcohol use. She reports that she does not use drugs.   Family History:  The patient's family  history includes Aortic stenosis in her mother; Breast cancer (age of onset: 37) in her maternal aunt; Cancer in her father, granddaughter, and maternal aunt; Cancer (age of onset: 1) in her grandchild; Diabetes in her mother; Melanoma in her son; Retinoblastoma in her son.    ROS:  Please see the history of present illness.   Otherwise, review of systems are positive for none.   All other systems are reviewed and negative.    PHYSICAL EXAM: VS:  BP (!) 144/80 (BP Location: Left Arm, Patient Position: Sitting, Cuff Size: Normal)   Pulse 79   Ht 5' 5.5" (1.664 m)   Wt 189 lb 8 oz (86 kg)   LMP  (LMP Unknown)   SpO2 98%   BMI 31.05 kg/m  , BMI Body mass index is 31.05 kg/m. GEN: Well nourished, well developed, in no acute distress  HEENT: normal  Neck: no JVD, carotid bruits, or masses Cardiac: RRR; no  rubs, or gallops,no edema . 2/ 6 systolic murmur in the aortic area Respiratory:  clear to auscultation bilaterally, normal work of breathing GI: soft, nontender, nondistended, + BS MS: no deformity or atrophy  Skin: warm and dry, no rash Neuro:  Strength and sensation are intact Psych: euthymic mood, full affect Right groin is intact with no hematoma   EKG:  EKG is not ordered today.    Recent Labs: 06/29/2020: TSH 3.03 04/05/2021: ALT 22; BUN 23; Creatinine, Ser 1.04; Hemoglobin 11.5; Platelets 329; Potassium 3.8; Sodium 136    Lipid Panel    Component Value Date/Time   CHOL 145 12/18/2020 0941   CHOL 175 02/17/2013 0746   TRIG 85.0 12/18/2020 0941   TRIG 100 02/17/2013 0746   HDL 60.30 12/18/2020 0941   HDL 53 02/17/2013 0746   CHOLHDL 2 12/18/2020 0941   VLDL 17.0 12/18/2020 0941   VLDL 20 02/17/2013 0746   LDLCALC 68 12/18/2020 0941   LDLCALC 102 (H) 02/17/2013 0746   LDLDIRECT 101.0 11/11/2016 0906      Wt Readings from Last 3 Encounters:  04/20/21 189 lb 8 oz (86 kg)  03/28/21 187 lb (84.8 kg)  03/27/21 187 lb 6.4 oz (85 kg)       No flowsheet data  found.    ASSESSMENT AND PLAN:  1. Renal artery stenosis: Status post recent angioplasty and stent placement to the left renal artery for severe proximal stenosis.  She is to finish 1 month of clopidogrel and then stop.  Continue aspirin.  I requested renal artery duplex for surveillance of in-stent restenosis. Blood pressure improved overall.  2. Likely bicuspid aortic valve with ascending aortic aneurysm.  Most recent echocardiogram showed moderate aortic stenosis.  3. Ascending aortic aneurysm. Most recently this measured 48 x 49 mm. Indication for repair if this reaches 50 mm in the presence of bicuspid aortic valve.  4.  Hyperlipidemia: I reviewed most recent lipid profile which showed an LDL of 68.  Continue treatment with rosuvastatin.   Disposition: Renal artery duplex.  Follow-up with me in 6 months.  Signed,  Lorine Bears, MD  04/20/2021 1:16 PM    [facility]

## 2021-04-20 NOTE — Patient Instructions (Signed)
Medication Instructions:  Your physician recommends that you continue on your current medications as directed. Please refer to the Current Medication list given to you today.  *If you need a refill on your cardiac medications before your next appointment, please call your pharmacy*   Lab Work: None ordered If you have labs (blood work) drawn today and your tests are completely normal, you will receive your results only by: MyChart Message (if you have MyChart) OR A paper copy in the mail If you have any lab test that is abnormal or we need to change your treatment, we will call you to review the results.   Testing/Procedures: Your physician has requested that you have a renal artery duplex. During this test, an ultrasound is used to evaluate blood flow to the kidneys. Allow one hour for this exam. Do not eat after midnight the day before and avoid carbonated beverages. Take your medications as you usually do.    Follow-Up: At [facility], you and your health needs are our priority.  As part of our continuing mission to provide you with exceptional heart care, we have created designated Provider Care Teams.  These Care Teams include your primary Cardiologist (physician) and Advanced Practice Providers (APPs -  Physician Assistants and Nurse Practitioners) who all work together to provide you with the care you need, when you need it.  We recommend signing up for the patient portal called "MyChart".  Sign up information is provided on this After Visit Summary.  MyChart is used to connect with patients for Virtual Visits (Telemedicine).  Patients are able to view lab/test results, encounter notes, upcoming appointments, etc.  Non-urgent messages can be sent to your provider as well.   To learn more about what you can do with MyChart, go to [email or web address].    Your next appointment:   6 month(s)  The format for your next appointment:   In Person  Provider:   You may see Lorine Bears, MD or one of the following Advanced Practice Providers on your designated Care Team:   Nicolasa Ducking, NP Eula Listen, PA-C Cadence Fransico Michael, [place]   Other Instructions N/A

## 2021-04-23 ENCOUNTER — Encounter: Payer: Self-pay | Admitting: Internal Medicine

## 2021-04-24 NOTE — Assessment & Plan Note (Signed)
Ultrasound of thyroid nodules done Dec 2022  notes decrease in size of both lobes and minimal increase in the size of the right thyroid nodule.  None of the nodules meet criteria for biopsy. Follow up ultrasound is recommended in one year for surveillance.

## 2021-04-25 ENCOUNTER — Other Ambulatory Visit: Payer: Self-pay | Admitting: Internal Medicine

## 2021-04-25 ENCOUNTER — Other Ambulatory Visit: Payer: Self-pay

## 2021-04-25 DIAGNOSIS — I10 Essential (primary) hypertension: Secondary | ICD-10-CM

## 2021-04-25 DIAGNOSIS — E1169 Type 2 diabetes mellitus with other specified complication: Secondary | ICD-10-CM

## 2021-04-25 DIAGNOSIS — E669 Obesity, unspecified: Secondary | ICD-10-CM

## 2021-04-25 MED ORDER — OZEMPIC (0.25 OR 0.5 MG/DOSE) 2 MG/1.5ML ~~LOC~~ SOPN
0.5000 mg | PEN_INJECTOR | SUBCUTANEOUS | 2 refills | Status: DC
  Filled 2021-04-25: qty 1.5, 28d supply, fill #0
  Filled 2021-06-24: qty 1.5, 28d supply, fill #1
  Filled 2021-08-03: qty 1.5, 28d supply, fill #2

## 2021-04-25 MED ORDER — TELMISARTAN 80 MG PO TABS
80.0000 mg | ORAL_TABLET | Freq: Every day | ORAL | 1 refills | Status: DC
  Filled 2021-04-25: qty 90, 90d supply, fill #0
  Filled 2021-07-16: qty 90, 90d supply, fill #1

## 2021-04-27 ENCOUNTER — Telehealth (HOSPITAL_COMMUNITY): Payer: Self-pay

## 2021-04-27 NOTE — Telephone Encounter (Signed)
Transitions of Care Pharmacy   Call attempted for a pharmacy transitions of care follow-up. HIPAA appropriate voicemail was left with call back information provided.   Call attempt #3. Will no longer attempt follow up for TOC pharmacy.   

## 2021-05-02 ENCOUNTER — Other Ambulatory Visit: Payer: Self-pay

## 2021-05-03 ENCOUNTER — Other Ambulatory Visit: Payer: Self-pay

## 2021-05-04 ENCOUNTER — Ambulatory Visit (HOSPITAL_COMMUNITY)
Admission: RE | Admit: 2021-05-04 | Payer: 59 | Source: Ambulatory Visit | Attending: Cardiovascular Disease | Admitting: Cardiovascular Disease

## 2021-05-10 ENCOUNTER — Other Ambulatory Visit: Payer: Self-pay

## 2021-05-18 ENCOUNTER — Other Ambulatory Visit: Payer: Self-pay

## 2021-05-18 MED FILL — Tramadol HCl Tab 50 MG: ORAL | 30 days supply | Qty: 240 | Fill #4 | Status: AC

## 2021-06-06 ENCOUNTER — Other Ambulatory Visit: Payer: Self-pay

## 2021-06-08 ENCOUNTER — Other Ambulatory Visit: Payer: Self-pay | Admitting: Internal Medicine

## 2021-06-08 ENCOUNTER — Other Ambulatory Visit: Payer: Self-pay

## 2021-06-08 MED FILL — Metformin HCl Tab ER 24HR 750 MG: ORAL | 90 days supply | Qty: 90 | Fill #0 | Status: AC

## 2021-06-08 NOTE — Telephone Encounter (Signed)
Last OV 03/27/21 Next OV 09/26/21 Last A1C 03/27/21 Sent in refill.

## 2021-06-24 ENCOUNTER — Other Ambulatory Visit: Payer: Self-pay | Admitting: Internal Medicine

## 2021-06-25 ENCOUNTER — Other Ambulatory Visit: Payer: Self-pay

## 2021-06-25 MED ORDER — TRAMADOL HCL 50 MG PO TABS
ORAL_TABLET | ORAL | 5 refills | Status: DC
  Filled 2021-06-25: qty 240, 30d supply, fill #0
  Filled 2021-08-02: qty 240, 30d supply, fill #1
  Filled 2021-09-13: qty 240, 30d supply, fill #2
  Filled 2021-10-19: qty 240, 30d supply, fill #3
  Filled 2021-11-29: qty 240, 30d supply, fill #4

## 2021-07-09 ENCOUNTER — Other Ambulatory Visit: Payer: Self-pay

## 2021-07-13 ENCOUNTER — Other Ambulatory Visit: Payer: Self-pay

## 2021-07-13 ENCOUNTER — Ambulatory Visit (INDEPENDENT_AMBULATORY_CARE_PROVIDER_SITE_OTHER): Payer: 59

## 2021-07-13 DIAGNOSIS — I701 Atherosclerosis of renal artery: Secondary | ICD-10-CM

## 2021-07-17 ENCOUNTER — Other Ambulatory Visit: Payer: Self-pay

## 2021-07-18 ENCOUNTER — Telehealth: Payer: Self-pay | Admitting: *Deleted

## 2021-07-18 DIAGNOSIS — I701 Atherosclerosis of renal artery: Secondary | ICD-10-CM

## 2021-07-18 NOTE — Telephone Encounter (Signed)
-----   Message from Iran Ouch, MD sent at 07/17/2021  5:40 PM EST ----- ?Patent left renal artery stent with no significant restenosis.  Repeat study in 1 year. ? ?

## 2021-07-18 NOTE — Telephone Encounter (Signed)
Left voicemail message to call back for review of results.  

## 2021-07-19 NOTE — Telephone Encounter (Signed)
Reviewed results and recommendations with patient. She verbalized understanding with no further questions at this time.  ?

## 2021-08-02 ENCOUNTER — Other Ambulatory Visit: Payer: Self-pay

## 2021-08-03 ENCOUNTER — Other Ambulatory Visit: Payer: Self-pay

## 2021-08-06 ENCOUNTER — Other Ambulatory Visit: Payer: Self-pay

## 2021-08-14 ENCOUNTER — Encounter: Payer: Self-pay | Admitting: Cardiovascular Disease

## 2021-08-14 ENCOUNTER — Ambulatory Visit (INDEPENDENT_AMBULATORY_CARE_PROVIDER_SITE_OTHER): Payer: 59 | Admitting: Cardiovascular Disease

## 2021-08-14 VITALS — BP 110/80 | HR 75 | Ht 66.0 in | Wt 174.0 lb

## 2021-08-14 DIAGNOSIS — I359 Nonrheumatic aortic valve disorder, unspecified: Secondary | ICD-10-CM

## 2021-08-14 NOTE — Progress Notes (Signed)
Cardiology Office Note ? ?Date:  08/14/2021  ? ?ID:  Courtney Lopez, DOB [birth year], MRN [id number] ? ?PCP:  Crecencio Mc, MD  ? ?Chief Complaint  ?Patient presents with  ? 6 month follow up   ?  "Doing well." Medications reviewed by the patient verbally.   ? ? 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 moderately dilated ascending aorta, 4.9 cm ?Renal artery stenosis estimated 60 to 70% on the left based off CT scan and confirmed on ultrasound ?who presents for follow-up of her Bicuspid aortic valve, dilated ascending aorta and hypertension, PVCs, renal artery stenosis, s/p stent ? ?Last office visit with myself 8/22 ?Reports having labile blood pressure ?For low blood pressure, will improve with aggressive fluid hydration, coffee ?Hypertension treated with hydralazine as needed ? ?Since renal artery stenosis stenting, symptoms slowly improving ? ?15 pound weight loss over the past several months ? ?Medication regimen: ?Micardis 80 daily ?Coreg 25 BID ?Hydralazine 25 TID prn ? ?Cath intervention 11/22 reviewed ?Severe 95% stenosis in the proximal left renal artery ?2.  Successful angioplasty and stent placement to the left renal artery. ? ?CT chest 11/22 reviewed ?Stable dilatation of the ascending aorta measuring 4.9 cm in diameter.  ? ?EKG personally reviewed by myself on todays visit ?Nsr rate 75 no significant ST-T wave changes ? ?Other past medical history reviewed ?CTA of the chest on 12/19/2020 showed a stable fusiform ascending aortic aneurysm with maximum diameter of 5 cm.  The previous scan in 2020 showed the diameter to be 4.9 cm. ?-Mention of Interval diffuse left renal atrophy, suspect ?significant left renal ostial vascular disease when compared to ?10/21/2018. ? ?BP elevated, "insane" at times ?Going up on hydralazine, taking 20 mg 3 times daily ?Even tried amlodipine for recent spike in blood  pressure ? ?CT chest 10/2018,  ?Unchanged aneurysmal dilatation of the tubular ascending thoracic ?aorta, measuring 4.9 x 4.8 cm.  ? ?CT scan and ultrasound reviewed ?Cervical spine MRI reviewed no significant carotid stenosis with section it could be seen ? ?hospital October 21, 2018 for vision changes ?blood pressure at home  low at 87/46.   asymptomatic at the time and decided to forego taking her evening carvedilol and losartan.  At work, she was busy but feeling fine until around 4 AM when she developed blurriness in the left eye while working on a computer.  She also had a mild headache, which she experiences intermittently and attributes to chronic neck problems.  She presented to the emergency department for further evaluation was found to be severely hypertensive with a blood pressure of 244/121.  She was given carvedilol 25 mg, HCTZ 12.5 mg, and 1 inch of nitroglycerin paste with some improvement in her blood pressure.   ? ?MRI of the brain did not show any acute abnormalities. ?Chronic left maxillary sinus disease. ?MRI cervical spine reviewed no significant carotid stenosis ? ?There are solitary bilateral renal arteries with high-grade, ?approximately 70% stenosis of the proximal left renal artery (series ?5, image 112) and apparent atherosclerosis without significant ?stenosis of the origin of the right renal artery.  ?may indicate non atherosclerotic etiology such as fibromuscular dysplasia. ? ?Other past medical history reviewed ?Previously with paroxysmal tachycardia captured on her phone using pulse meter ? ?Echo 12/2016 ?Dilated aorta mild ?4.2 cm. Stable ?Aortic valve bicuspid also looks stable, mean gradient 11 mmHg ?Normal cardiac function ? ?  Echo December 2017 ?Dilated a sending aorta 4.6 cm ? ?CT scan December 2018 ?Ascending aorta 4.7 cm ? ?CT scan January 2018 ascending aorta 4.6 cm ? ?CT scan December 2017 ?Aorta 4.6 cm ? ? ?PMH:   has a past medical history of Ankle fracture (12/31/2018),  Anxiety, Arthritis, Ascending aortic aneurysm ([place]), Bicuspid aortic valve, Breast screening, unspecified, Carotid arterial disease ([place]), Cervicalgia, Diabetes mellitus without complication ([place]), Elevated coronary artery calcium score, Fibromuscular dysplasia ([place]), Heart murmur, History of hiatal hernia, Metatarsal fracture (2007), Moderate aortic stenosis, Multiple thyroid nodules, Other symptoms involving abdomen and pelvis(789.9), Renal artery stenosis ([place]), Renal cyst, right, Retinal hemorrhage, Symptomatic menopausal or female climacteric states, Unspecified essential hypertension, and Wears contact lenses. ? ?PSH:    ?Past Surgical History:  ?Procedure Laterality Date  ? ABDOMINAL HYSTERECTOMY    ? ANKLE ARTHROSCOPY WITH RECONSTRUCTION Left 08/24/2020  ? Procedure: LEFT ANKLE ARTHROSCOPIC DEBRIDEMENT, EXTENSIVE, ARTHOSCROPIC TREATMENT OF TALUS OSTEOCHONDRAL LESION, LATERAL LIGAMENT RECONSTRUCTION;  Surgeon: Erle Crocker, MD;  Location: [place];  Service: Orthopedics;  Laterality: Left;  ? APPENDECTOMY    ? done during oophorectomy  ? CESAREAN SECTION    ? x 2  ? ETHMOIDECTOMY Left 03/02/2020  ? Procedure: ETHMOIDECTOMY;  Surgeon: Margaretha Sheffield, MD;  Location: [place];  Service: ENT;  Laterality: Left;  ? gyn surgery    ? hysterectomy- done for adenomysosis and endometriosis  ? IMAGE GUIDED SINUS SURGERY N/A 03/02/2020  ? Procedure: IMAGE GUIDED SINUS SURGERY;  Surgeon: Margaretha Sheffield, MD;  Location: [place];  Service: ENT;  Laterality: N/A;  need disk ?PLACED DISK ON OR CHARGE NURSE DESK 9-17 KP  ? IMAGE GUIDED SINUS SURGERY Left 04/13/2020  ? Procedure: IMAGE GUIDED SINUS SURGERY;  Surgeon: Margaretha Sheffield, MD;  Location: [place];  Service: ENT;  Laterality: Left;  needs stryker disk ?per Justice Rocher use same disk as before (October) put on charge nurses desk. DS 04/03/20  ? MAXILLARY ANTROSTOMY Left 03/02/2020  ? Procedure: MAXILLARY ANTROSTOMY;   Surgeon: Margaretha Sheffield, MD;  Location: [place];  Service: ENT;  Laterality: Left;  ? MAXILLARY ANTROSTOMY Left 04/13/2020  ? Procedure: MAXILLECTOMY;  Surgeon: Margaretha Sheffield, MD;  Location: [place];  Service: ENT;  Laterality: Left;  ? OOPHORECTOMY    ? unilateral, due to ruptured ovarian cysts  ? OSTEOTOMY MAXILLARY    ? due to bite problems  ? PERIPHERAL VASCULAR INTERVENTION  03/28/2021  ? Procedure: PERIPHERAL VASCULAR INTERVENTION;  Surgeon: Wellington Hampshire, MD;  Location: [facility];  Service: Cardiovascular;;  ? RENAL ANGIOGRAPHY N/A 03/28/2021  ? Procedure: RENAL ANGIOGRAPHY;  Surgeon: Wellington Hampshire, MD;  Location: [facility];  Service: Cardiovascular;  Laterality: N/A;  ? TUBAL LIGATION    ? ? ?Current Outpatient Medications  ?Medication Sig Dispense Refill  ? ALPRAZolam (XANAX) 0.25 MG tablet TAKE 1 TABLET BY MOUTH TWICE DAILY AS NEEDED 60 tablet 5  ? aspirin EC 81 MG tablet Take 1 tablet (81 mg total) by mouth daily. Swallow whole.    ? carvedilol (COREG) 25 MG tablet Take one tablet  by mouth 2 (two) times daily with a meal. 180 tablet 1  ? estradiol (ESTRACE) 0.1 MG/GM vaginal cream INSERT 1/4 APPLICATORFUL VAGINALLY TWO TIMES A WEEK AS DIRECTED (Patient taking differently: Place [id number] Applicatorfuls vaginally 2 (two) times a week. Sundays & Thursdays) 42.5 g 5  ? fluticasone (FLONASE) 50 MCG/ACT nasal spray PLACE 2 SPRAYS INTO BOTH  NOSTRILS DAILY. (Patient taking differently: Place 2 sprays into both nostrils every evening.) 16 g 2  ? HYDROcodone-acetaminophen (NORCO) 10-325 MG tablet TAKE 1 TABLET BY MOUTH EVERY 6 (SIX) HOURS AS NEEDED FOR SEVERE PAIN. 30 tablet 0  ? Menthol, Topical Analgesic, (BIOFREEZE EX) Apply 1 application topically at bedtime. Applied to neck area    ? metFORMIN (GLUCOPHAGE-XR) 750 MG 24 hr tablet Take 1 tablet by mouth daily with breakfast. 90 tablet 1  ? rosuvastatin (CRESTOR) 5 MG tablet Take 1 tablet (5 mg total) by mouth at  bedtime. 90 tablet 2  ? Semaglutide,0.25 or 0.5MG /DOS, (OZEMPIC, 0.25 OR 0.5 MG/DOSE,) 2 MG/1.5ML SOPN Inject 0.5 mg into the skin once a week. 1.5 mL 2  ? sodium fluoride (SF 5000 PLUS) 1.1 % CREA dental cream Brush teeth

## 2021-08-14 NOTE — Patient Instructions (Addendum)
Medication Instructions:  ?No changes ? ?If you need a refill on your cardiac medications before your next appointment, please call your pharmacy.  ? ?Lab work: ?No new labs needed ? ?Testing/Procedures: ? ?Your physician has requested that you have an echocardiogram in August. Echocardiography is a painless test that uses sound waves to create images of your heart. It provides your doctor with information about the size and shape of your heart and how well your heart?s chambers and valves are working. This procedure takes approximately one hour. There are no restrictions for this procedure.  ? ?Follow-Up: ?At [facility], you and your health needs are our priority.  As part of our continuing mission to provide you with exceptional heart care, we have created designated Provider Care Teams.  These Care Teams include your primary Cardiologist (physician) and Advanced Practice Providers (APPs -  Physician Assistants and Nurse Practitioners) who all work together to provide you with the care you need, when you need it. ? ?You will need a follow up appointment in 12 months ? ?Providers on your designated Care Team:   ?Nicolasa Ducking, NP ?Eula Listen, PA-C ?Cadence Fransico Michael, PA-C ? ?COVID-19 Vaccine Information can be found at: [email or web address] For questions related to vaccine distribution or appointments, please email [email or web address] or call [phone].  ? ?

## 2021-08-22 ENCOUNTER — Ambulatory Visit: Payer: 59

## 2021-08-31 ENCOUNTER — Other Ambulatory Visit: Payer: Self-pay | Admitting: Internal Medicine

## 2021-08-31 ENCOUNTER — Other Ambulatory Visit: Payer: Self-pay

## 2021-08-31 DIAGNOSIS — E669 Obesity, unspecified: Secondary | ICD-10-CM

## 2021-08-31 MED FILL — Semaglutide Soln Pen-inj 0.25 or 0.5 MG/DOSE (2 MG/1.5ML): SUBCUTANEOUS | 28 days supply | Qty: 1.5 | Fill #0 | Status: AC

## 2021-08-31 MED FILL — Metformin HCl Tab ER 24HR 750 MG: ORAL | 90 days supply | Qty: 90 | Fill #1 | Status: AC

## 2021-09-07 ENCOUNTER — Ambulatory Visit: Payer: 59

## 2021-09-11 ENCOUNTER — Ambulatory Visit: Payer: 59

## 2021-09-14 ENCOUNTER — Other Ambulatory Visit: Payer: Self-pay

## 2021-09-14 ENCOUNTER — Other Ambulatory Visit: Payer: Self-pay | Admitting: Internal Medicine

## 2021-09-14 DIAGNOSIS — J069 Acute upper respiratory infection, unspecified: Secondary | ICD-10-CM

## 2021-09-14 MED ORDER — FLUTICASONE PROPIONATE 50 MCG/ACT NA SUSP
2.0000 | Freq: Every day | NASAL | 2 refills | Status: DC
  Filled 2021-09-14: qty 16, 30d supply, fill #0
  Filled 2021-10-26: qty 16, 30d supply, fill #1
  Filled 2021-11-29: qty 16, 30d supply, fill #2

## 2021-09-21 ENCOUNTER — Ambulatory Visit (INDEPENDENT_AMBULATORY_CARE_PROVIDER_SITE_OTHER): Payer: 59

## 2021-09-21 ENCOUNTER — Encounter: Payer: Self-pay | Admitting: Internal Medicine

## 2021-09-21 ENCOUNTER — Ambulatory Visit: Payer: 59 | Admitting: Internal Medicine

## 2021-09-21 ENCOUNTER — Other Ambulatory Visit: Payer: Self-pay

## 2021-09-21 VITALS — BP 164/96 | HR 78 | Temp 98.4°F | Ht 66.0 in | Wt 163.2 lb

## 2021-09-21 DIAGNOSIS — L247 Irritant contact dermatitis due to plants, except food: Secondary | ICD-10-CM

## 2021-09-21 DIAGNOSIS — E669 Obesity, unspecified: Secondary | ICD-10-CM

## 2021-09-21 DIAGNOSIS — I10 Essential (primary) hypertension: Secondary | ICD-10-CM

## 2021-09-21 DIAGNOSIS — E1169 Type 2 diabetes mellitus with other specified complication: Secondary | ICD-10-CM | POA: Diagnosis not present

## 2021-09-21 DIAGNOSIS — I77810 Thoracic aortic ectasia: Secondary | ICD-10-CM | POA: Diagnosis not present

## 2021-09-21 DIAGNOSIS — R059 Cough, unspecified: Secondary | ICD-10-CM | POA: Diagnosis not present

## 2021-09-21 DIAGNOSIS — J0100 Acute maxillary sinusitis, unspecified: Secondary | ICD-10-CM | POA: Diagnosis not present

## 2021-09-21 DIAGNOSIS — R051 Acute cough: Secondary | ICD-10-CM

## 2021-09-21 DIAGNOSIS — I350 Nonrheumatic aortic (valve) stenosis: Secondary | ICD-10-CM

## 2021-09-21 DIAGNOSIS — E782 Mixed hyperlipidemia: Secondary | ICD-10-CM | POA: Diagnosis not present

## 2021-09-21 DIAGNOSIS — J019 Acute sinusitis, unspecified: Secondary | ICD-10-CM | POA: Insufficient documentation

## 2021-09-21 DIAGNOSIS — R062 Wheezing: Secondary | ICD-10-CM | POA: Diagnosis not present

## 2021-09-21 DIAGNOSIS — E1121 Type 2 diabetes mellitus with diabetic nephropathy: Secondary | ICD-10-CM

## 2021-09-21 DIAGNOSIS — I7121 Aneurysm of the ascending aorta, without rupture: Secondary | ICD-10-CM | POA: Diagnosis not present

## 2021-09-21 MED ORDER — TRIAMCINOLONE ACETONIDE 0.1 % EX CREA
1.0000 "application " | TOPICAL_CREAM | Freq: Two times a day (BID) | CUTANEOUS | 0 refills | Status: DC
  Filled 2021-09-21: qty 30, 7d supply, fill #0

## 2021-09-21 MED ORDER — HYDROCODONE-ACETAMINOPHEN 10-325 MG PO TABS
1.0000 | ORAL_TABLET | Freq: Four times a day (QID) | ORAL | 0 refills | Status: DC | PRN
  Filled 2021-09-21: qty 30, 8d supply, fill #0

## 2021-09-21 MED ORDER — PREDNISONE 10 MG PO TABS
ORAL_TABLET | ORAL | 0 refills | Status: DC
  Filled 2021-09-21: qty 21, 6d supply, fill #0

## 2021-09-21 MED ORDER — AMOXICILLIN-POT CLAVULANATE 875-125 MG PO TABS
1.0000 | ORAL_TABLET | Freq: Two times a day (BID) | ORAL | 0 refills | Status: DC
  Filled 2021-09-21: qty 14, 7d supply, fill #0

## 2021-09-21 MED ORDER — AMLODIPINE BESYLATE 5 MG PO TABS
5.0000 mg | ORAL_TABLET | Freq: Every day | ORAL | 1 refills | Status: DC
  Filled 2021-09-21: qty 60, 60d supply, fill #0

## 2021-09-21 MED ORDER — HYDROCODONE-ACETAMINOPHEN 10-325 MG PO TABS: 1.0000 | ORAL_TABLET | Freq: Four times a day (QID) | ORAL | 0 refills | Status: DC | PRN

## 2021-09-21 MED ORDER — OZEMPIC (0.25 OR 0.5 MG/DOSE) 2 MG/1.5ML ~~LOC~~ SOPN
0.5000 mg | PEN_INJECTOR | SUBCUTANEOUS | 2 refills | Status: DC
  Filled 2021-09-21: qty 1.5, 28d supply, fill #0

## 2021-09-21 NOTE — Progress Notes (Signed)
? ?Subjective:  ?Patient ID: Courtney Lopez, female    DOB: [birth year]  Age: 61 y.o. MRN: [id number] ? ?CC: The primary encounter diagnosis was Acute cough. Diagnoses of DM type 2 with diabetic mixed hyperlipidemia ([place]), Diabetes mellitus type 2 in obese [facility]), Acute non-recurrent maxillary sinusitis, Aortic valve stenosis, etiology of cardiac valve disease unspecified, Ascending aorta dilatation ([place]), Diabetic nephropathy associated with type 2 diabetes mellitus ([place]), Primary hypertension, Aneurysm of ascending aorta without rupture ([place]), and Contact dermatitis and eczema due to plant were also pertinent to this visit. ? ?HPI ?Courtney Lopez presents for follow up on multiple issues  ?Chief Complaint  ?Patient presents with  ? Follow-up  ?  6 month follow up   ? ?1) contact dermatitis due to poison ivy.  Several areas on both arms.  ? ?2) sinusitis/bronchitis with purulent sputum since April 25.  Started wheezing last night and had a "Low grade" fever  ? ?3) elevated blood pressure despite use of  prn's  hydralazine.  Taking telmisartan 80 mg and carvedilol 25 mg bid . Has taken 100 mg hydralazine since last evening . Had a renal artery stenosis of 95%  by arteriogram.  Stent was placed and repeat Ultrasound was done.  BP's have been normal to low most of the time  the elevations are transient ,  attributed to working night shift  ? ?4) Diabetes with h/o  Obesity:  She  feels generally well Checking  blood sugars less than once daily at variable times, usually only if she feels she may be having a hypoglycemic event. .  BS have been under 130 fasting and < 150 post prandially.  Denies any recent hypoglyemic events.  Taking   medications as directed. Following a carbohydrate modified diet 6 days per week. Denies numbness, burning and tingling of extremities. Appetite is good.   she has lost 24 lbs since starting semaglutide .  Not exercising regularly yet; has not reached intended goal  ? ?Outpatient  Medications Prior to Visit  ?Medication Sig Dispense Refill  ? ALPRAZolam (XANAX) 0.25 MG tablet TAKE 1 TABLET BY MOUTH TWICE DAILY AS NEEDED 60 tablet 5  ? aspirin EC 81 MG tablet Take 1 tablet (81 mg total) by mouth daily. Swallow whole.    ? carvedilol (COREG) 25 MG tablet Take one tablet  by mouth 2 (two) times daily with a meal. 180 tablet 1  ? estradiol (ESTRACE) 0.1 MG/GM vaginal cream INSERT 1/4 APPLICATORFUL VAGINALLY TWO TIMES A WEEK AS DIRECTED (Patient taking differently: Place [id number] Applicatorfuls vaginally 2 (two) times a week. Sundays & Thursdays) 42.5 g 5  ? fluticasone (FLONASE) 50 MCG/ACT nasal spray PLACE 2 SPRAYS INTO BOTH NOSTRILS DAILY. 16 g 2  ? hydrALAZINE (APRESOLINE) 25 MG tablet Take 1 tablet (25 mg total) by mouth 3 (three) times daily. 270 tablet 3  ? Menthol, Topical Analgesic, (BIOFREEZE EX) Apply 1 application topically at bedtime. Applied to neck area    ? metFORMIN (GLUCOPHAGE-XR) 750 MG 24 hr tablet Take 1 tablet by mouth daily with breakfast. 90 tablet 1  ? ondansetron (ZOFRAN) 4 MG tablet Take 1 tablet (4 mg total) by mouth every 8 (eight) hours as needed for up to 10 doses for nausea or vomiting. 10 tablet 0  ? rosuvastatin (CRESTOR) 5 MG tablet Take 1 tablet (5 mg total) by mouth at bedtime. 90 tablet 2  ? sodium fluoride (SF 5000 PLUS) 1.1 % CREA dental cream Brush teeth thoroughly for 2 minutes,  do not rinse afterwards. 51 g 12  ? telmisartan (MICARDIS) 80 MG tablet Take 1 tablet (80 mg total) by mouth daily. 90 tablet 1  ? traMADol (ULTRAM) 50 MG tablet TAKE 2 TABLETS (100 MG TOTAL) BY MOUTH EVERY 6 (SIX) HOURS AS NEEDED. 240 tablet 5  ? HYDROcodone-acetaminophen (NORCO) 10-325 MG tablet TAKE 1 TABLET BY MOUTH EVERY 6 (SIX) HOURS AS NEEDED FOR SEVERE PAIN. 30 tablet 0  ? Semaglutide,0.25 or 0.5MG /DOS, (OZEMPIC, 0.25 OR 0.5 MG/DOSE,) 2 MG/1.5ML SOPN Inject 0.5 mg into the skin once a week. 1.5 mL 2  ? ?No facility-administered medications prior to visit.  ? ? ?Review of  Systems; ? ?Patient denies headache, fevers, malaise, unintentional weight loss, skin rash, eye pain, sinus congestion and sinus pain, sore throat, dysphagia,  hemoptysis , cough, dyspnea, wheezing, chest pain, palpitations, orthopnea, edema, abdominal pain, nausea, melena, diarrhea, constipation, flank pain, dysuria, hematuria, urinary  Frequency, nocturia, numbness, tingling, seizures,  Focal weakness, Loss of consciousness,  Tremor, insomnia, depression, anxiety, and suicidal ideation.   ? ? ? ?Objective:  ?BP (!) 164/96 (BP Location: Left Arm, Patient Position: Sitting, Cuff Size: Normal)   Pulse 78   Temp 98.4 ?F (36.9 ?C) (Oral)   Ht 5\' 6"  (1.676 m)   Wt 163 lb 3.2 oz (74 kg)   LMP  (LMP Unknown)   SpO2 98%   BMI 26.34 kg/m?  ? ?BP Readings from Last 3 Encounters:  ?09/21/21 (!) 164/96  ?08/14/21 110/80  ?04/20/21 (!) 144/80  ? ? ?Wt Readings from Last 3 Encounters:  ?09/21/21 163 lb 3.2 oz (74 kg)  ?08/14/21 174 lb (78.9 kg)  ?04/20/21 189 lb 8 oz (86 kg)  ? ? ?General appearance: alert, cooperative and appears stated age ?Ears: normal TM's and external ear canals both ears ?Throat: lips, mucosa, and tongue normal; teeth and gums normal ?Neck: no adenopathy, no carotid bruit, supple, symmetrical, trachea midline and thyroid not enlarged, symmetric, no tenderness/mass/nodules ?Back: symmetric, no curvature. ROM normal. No CVA tenderness. ?Lungs: clear to auscultation bilaterally ?Heart: regular rate and rhythm, S1, S2 normal, no murmur, click, rub or gallop ?Abdomen: soft, non-tender; bowel sounds normal; no masses,  no organomegaly ?Pulses: 2+ and symmetric ?Skin: Skin color, texture, turgor normal. No rashes or lesions ?Lymph nodes: Cervical, supraclavicular, and axillary nodes normal. ? ?Lab Results  ?Component Value Date  ? HGBA1C 5.8 (H) 09/21/2021  ? HGBA1C 6.6 (H) 03/23/2021  ? HGBA1C 6.3 12/18/2020  ? ? ?Lab Results  ?Component Value Date  ? CREATININE 0.96 09/21/2021  ? CREATININE 1.04 (H)  04/05/2021  ? CREATININE 1.01 (H) 03/23/2021  ? ? ?Lab Results  ?Component Value Date  ? WBC 12.8 (H) 04/05/2021  ? HGB 11.5 (L) 04/05/2021  ? HCT 35.2 (L) 04/05/2021  ? PLT 329 04/05/2021  ? GLUCOSE 118 (H) 09/21/2021  ? CHOL 132 09/21/2021  ? TRIG 63 09/21/2021  ? HDL 62 09/21/2021  ? LDLDIRECT 101.0 11/11/2016  ? [place] 57 09/21/2021  ? ALT 18 09/21/2021  ? AST 15 09/21/2021  ? NA 139 09/21/2021  ? K 4.9 09/21/2021  ? CL 100 09/21/2021  ? CREATININE 0.96 09/21/2021  ? BUN 20 09/21/2021  ? CO2 24 09/21/2021  ? TSH 3.03 06/29/2020  ? INR 1.0 10/21/2018  ? HGBA1C 5.8 (H) 09/21/2021  ? MICROALBUR 6.2 (H) 12/18/2020  ? ? ?US THYROID ? ?Result Date: 04/20/2021 ?CLINICAL DATA:  Prior ultrasound follow-up. EXAM: THYROID ULTRASOUND TECHNIQUE: Ultrasound examination of the thyroid gland and  adjacent soft tissues was performed. COMPARISON:  11/08/2019, 04/22/2018 FINDINGS: Parenchymal Echotexture: Moderately heterogenous Isthmus: 0.4 cm, previously 0.4 cm Right lobe: 4.8 x 2.2 x 2.2 cm, previously 5.2 x 2.4 x 2.2 cm Left lobe: 3.8 x 1.5 x 1.2 cm, previously 4.3 x 1.2 x 1.4 cm _________________________________________________________ Estimated total number of nodules >/= 1 cm: 2 Number of spongiform nodules >/=  2 cm not described below (TR1): 0 Number of mixed cystic and solid nodules >/= 1.5 cm not described below ([place]): 0 _________________________________________________________ Nodule # 1: Prior biopsy: No Location: Right; Mid Maximum size: 2.2 cm; Other 2 dimensions: 1.6 x 1.5 cm, previously, 2.0 x 1.7 x 1.5 cm Composition: solid/almost completely solid (2) Echogenicity: isoechoic (1) Shape: not taller-than-wide (0) Margins: smooth (0) Echogenic foci: none (0) ACR TI-RADS total points: 3. ACR TI-RADS risk category:  TR3 (3 points). Significant change in size (>/= 20% in two dimensions and minimal increase of 2 mm): No Change in features: No Change in ACR TI-RADS risk category: No ACR TI-RADS recommendations: *Given size  (>/= 1.5 - 2.4 cm) and appearance, a follow-up ultrasound in 1 year should be considered based on TI-RADS criteria. _________________________________________________________ Unchanged appearance of benign appearing po

## 2021-09-21 NOTE — Patient Instructions (Signed)
Start the augmentin .tonight.  add the prednisone if you have any more wheezing ? ?A second antibiotic will be added if the chest x ray suggests pneumonia ? ? ?Add amlodipine 5 mg prn bp issues ? ?Hydrocodone has been refilled  ? ?Triamcinolone ointment for poison ivy twice daily  ? ?Continue 0.5 mg semaglutide weekly  ?

## 2021-09-21 NOTE — Assessment & Plan Note (Addendum)
Excellent glycemic control with  metformin XR. And addition of  0.5 mg  ozempic to manage weight gain and has lost 24 lbs.  But not exercising.  .  Continue ARB and statin  ? ? ?Lab Results  ?Component Value Date  ? HGBA1C 5.8 (H) 09/21/2021  ? ?Lab Results  ?Component Value Date  ? MICROALBUR 6.2 (H) 12/18/2020  ? MICROALBUR 3.9 (H) 10/20/2019  ? ? ? ? ?

## 2021-09-22 LAB — COMPREHENSIVE METABOLIC PANEL
ALT: 18 IU/L (ref 0–32)
AST: 15 IU/L (ref 0–40)
Albumin/Globulin Ratio: 1.9 (ref 1.2–2.2)
Albumin: 4.6 g/dL (ref 3.8–4.8)
Alkaline Phosphatase: 84 IU/L (ref 44–121)
BUN/Creatinine Ratio: 21 (ref 12–28)
BUN: 20 mg/dL (ref 8–27)
Bilirubin Total: 0.6 mg/dL (ref 0.0–1.2)
CO2: 24 mmol/L (ref 20–29)
Calcium: 9.6 mg/dL (ref 8.7–10.3)
Chloride: 100 mmol/L (ref 96–106)
Creatinine, Ser: 0.96 mg/dL (ref 0.57–1.00)
Globulin, Total: 2.4 g/dL (ref 1.5–4.5)
Glucose: 118 mg/dL — ABNORMAL HIGH (ref 70–99)
Potassium: 4.9 mmol/L (ref 3.5–5.2)
Sodium: 139 mmol/L (ref 134–144)
Total Protein: 7 g/dL (ref 6.0–8.5)
eGFR: 67 mL/min/{1.73_m2} (ref >59)

## 2021-09-22 LAB — LIPID PANEL
Chol/HDL Ratio: 2.1 ratio (ref 0.0–4.4)
Cholesterol, Total: 132 mg/dL (ref 100–199)
HDL: 62 mg/dL (ref >39)
LDL Chol Calc (NIH): 57 mg/dL (ref 0–99)
Triglycerides: 63 mg/dL (ref 0–149)
VLDL Cholesterol Cal: 13 mg/dL (ref 5–40)

## 2021-09-22 LAB — HEMOGLOBIN A1C
Est. average glucose Bld gHb Est-mCnc: 120 mg/dL
Hgb A1c MFr Bld: 5.8 % — ABNORMAL HIGH (ref 4.8–5.6)

## 2021-09-23 DIAGNOSIS — L247 Irritant contact dermatitis due to plants, except food: Secondary | ICD-10-CM | POA: Insufficient documentation

## 2021-09-23 NOTE — Assessment & Plan Note (Signed)
Empiric treatment with augmentin and prednisone taper. Given her cough,  Checking chest x ray to rule out PNA  Will need 2nd antibiotic if positive for infiltrate ?

## 2021-09-23 NOTE — Assessment & Plan Note (Signed)
With progressive dilation noted , 5 cm currently.  Has seen vascular surgery ([facility]),  Has had repeat ECHO,  Awaiting Normalization of BP  

## 2021-09-23 NOTE — Assessment & Plan Note (Signed)
prescribing triamcinolone for bid use on areas of outbreaks of poison ivy ?

## 2021-09-23 NOTE — Assessment & Plan Note (Signed)
She has had cardiothoracic surgery evaluation.  No surgical intervention at this time; last TTE surveillance. Was done in August 2021 .  Given her rise in BP  Repeat needed   ?

## 2021-09-23 NOTE — Assessment & Plan Note (Signed)
Goal BP is 120/70.  Annual surveillance of aorta is scheduled with ECHO this august  ?

## 2021-09-23 NOTE — Assessment & Plan Note (Addendum)
Excellent glycemic control with  metformin XR and recent addition of  Ozempic.  Continue ARB and statin  ? ? ?Lab Results  ?Component Value Date  ? HGBA1C 5.8 (H) 09/21/2021  ? ?Lab Results  ?Component Value Date  ? MICROALBUR 6.2 (H) 12/18/2020  ? MICROALBUR 3.9 (H) 10/20/2019  ? ? ? ? ?

## 2021-09-23 NOTE — Assessment & Plan Note (Signed)
Adding amlodipine 5 mg for recent elevations.  Continue carvedilol, telmisartan and hydralazine 25 to 50  Mg every 8 hours prn  ?

## 2021-09-24 ENCOUNTER — Other Ambulatory Visit: Payer: Self-pay

## 2021-09-26 ENCOUNTER — Ambulatory Visit: Payer: 59 | Admitting: Internal Medicine

## 2021-09-29 ENCOUNTER — Other Ambulatory Visit: Payer: Self-pay | Admitting: Internal Medicine

## 2021-10-01 ENCOUNTER — Other Ambulatory Visit: Payer: Self-pay

## 2021-10-01 MED ORDER — ALPRAZOLAM 0.25 MG PO TABS
0.2500 mg | ORAL_TABLET | Freq: Two times a day (BID) | ORAL | 5 refills | Status: DC | PRN
  Filled 2021-10-01: qty 60, 30d supply, fill #0
  Filled 2021-10-31: qty 60, 30d supply, fill #1
  Filled 2021-11-29: qty 60, 30d supply, fill #2
  Filled 2021-12-28: qty 60, 30d supply, fill #3
  Filled 2022-01-25: qty 60, 30d supply, fill #4
  Filled 2022-03-01: qty 60, 30d supply, fill #5

## 2021-10-01 NOTE — Telephone Encounter (Signed)
Refilled: 03/27/2021 Last OV: 09/21/2021 Next OV: 03/25/2022

## 2021-10-17 ENCOUNTER — Encounter: Payer: Self-pay | Admitting: Internal Medicine

## 2021-10-18 ENCOUNTER — Other Ambulatory Visit: Payer: Self-pay

## 2021-10-18 ENCOUNTER — Other Ambulatory Visit: Payer: Self-pay | Admitting: Internal Medicine

## 2021-10-18 DIAGNOSIS — I10 Essential (primary) hypertension: Secondary | ICD-10-CM

## 2021-10-18 MED FILL — Telmisartan Tab 80 MG: ORAL | 90 days supply | Qty: 90 | Fill #0 | Status: AC

## 2021-10-19 ENCOUNTER — Ambulatory Visit: Payer: 59

## 2021-10-19 ENCOUNTER — Other Ambulatory Visit: Payer: Self-pay

## 2021-10-23 ENCOUNTER — Encounter: Payer: Self-pay | Admitting: Internal Medicine

## 2021-10-25 ENCOUNTER — Encounter: Payer: Self-pay | Admitting: Cardiovascular Disease

## 2021-10-25 ENCOUNTER — Ambulatory Visit (INDEPENDENT_AMBULATORY_CARE_PROVIDER_SITE_OTHER): Payer: 59 | Admitting: Cardiovascular Disease

## 2021-10-25 ENCOUNTER — Other Ambulatory Visit: Payer: Self-pay

## 2021-10-25 VITALS — BP 140/84 | HR 79 | Ht 66.0 in | Wt 166.4 lb

## 2021-10-25 DIAGNOSIS — E785 Hyperlipidemia, unspecified: Secondary | ICD-10-CM

## 2021-10-25 DIAGNOSIS — I7121 Aneurysm of the ascending aorta, without rupture: Secondary | ICD-10-CM | POA: Diagnosis not present

## 2021-10-25 DIAGNOSIS — I701 Atherosclerosis of renal artery: Secondary | ICD-10-CM | POA: Diagnosis not present

## 2021-10-25 DIAGNOSIS — I359 Nonrheumatic aortic valve disorder, unspecified: Secondary | ICD-10-CM

## 2021-10-25 MED ORDER — SEMAGLUTIDE (1 MG/DOSE) 4 MG/3ML ~~LOC~~ SOPN
1.0000 mg | PEN_INJECTOR | SUBCUTANEOUS | 1 refills | Status: DC
  Filled 2021-10-25: qty 3, 28d supply, fill #0
  Filled 2021-12-21: qty 3, 28d supply, fill #1

## 2021-10-25 NOTE — Patient Instructions (Signed)
Medication Instructions:  Your physician recommends that you continue on your current medications as directed. Please refer to the Current Medication list given to you today.  *If you need a refill on your cardiac medications before your next appointment, please call your pharmacy*   Lab Work: None ordered If you have labs (blood work) drawn today and your tests are completely normal, you will receive your results only by: MyChart Message (if you have MyChart) OR A paper copy in the mail If you have any lab test that is abnormal or we need to change your treatment, we will call you to review the results.   Testing/Procedures: None ordered   Follow-Up: At [facility], you and your health needs are our priority.  As part of our continuing mission to provide you with exceptional heart care, we have created designated Provider Care Teams.  These Care Teams include your primary Cardiologist (physician) and Advanced Practice Providers (APPs -  Physician Assistants and Nurse Practitioners) who all work together to provide you with the care you need, when you need it.  We recommend signing up for the patient portal called "MyChart".  Sign up information is provided on this After Visit Summary.  MyChart is used to connect with patients for Virtual Visits (Telemedicine).  Patients are able to view lab/test results, encounter notes, upcoming appointments, etc.  Non-urgent messages can be sent to your provider as well.   To learn more about what you can do with MyChart, go to [email or web address].    Your next appointment:   6 month(s)  The format for your next appointment:   In Person  Provider:   Muhammad Arida, MD   Other Instructions N/A  Important Information About Sugar       

## 2021-10-25 NOTE — Progress Notes (Signed)
Cardiology Office Note   Date:  10/25/2021   ID:  Courtney Lopez, DOB 02/02/1961, MRN [id number]  PCP:  Sherlene Shams, MD  Cardiologist:  Dr. Mariah Milling  Chief Complaint  Patient presents with   Other    6 month f/u c/o fluctuating BP. Pt has cut back on BP meds due to low BP . Meds reviewed verbally with pt.       History of Present Illness: Courtney Lopez is a 61 y.o. female who is here today for a follow-up visit regarding renal artery stenosis.  She has known history of bicuspid aortic valve with ascending aortic aneurysm, renal artery stenosis, hyperlipidemia and type 2 diabetes. She had CTA of the aorta in 2020 which showed evidence of significant left renal artery stenosis with lack of significant calcifications. This was suggestive of fibromuscular dysplasia. Left renal artery stenosis was confirmed with renal artery duplex. Repeat renal artery duplex in 2021 showed significant stenosis in the mid left renal artery with peak velocity of 439.  Carotid Doppler showed no significant disease.   Blood pressure was initially controlled with medications with stable renal function and thus renal artery angiography was not pursued.   However, she had worsening renal function and blood pressure control in late 2022. I proceeded with angiography in November 2022 which showed 95% stenosis in the proximal left renal artery with no significant disease affecting the right renal artery.  I performed successful stent placement to the left renal artery .  She has been doing very well with no chest pain, shortness of breath or palpitations.  She has significant fluctuation in blood pressure and she takes hydralazine as needed.  She rarely has to take amlodipine.  Renal artery duplex in March showed patent left renal artery stent. She lost significant amount of weight over the last year with Ozempic.  Past Medical History:  Diagnosis Date   Ankle fracture 12/31/2018   left   Anxiety     Arthritis    neck, back, ankle, knees   Ascending aortic aneurysm (HCC)    a. 04/2016 CTA: 4.6cm Asc TAA; b. 04/2018 CTA: 4.8cm; c. 10/2018 CTA: 4.9x4.8cm; d. 12/2019 Echo: 4.8 cm.   Bicuspid aortic valve    Breast screening, unspecified    Carotid arterial disease (HCC)    a. 01/2020 U/S: 1-39% bilat ICA stenosis. No evidence of FMD.   Cervicalgia    cervical stenosis   Diabetes mellitus without complication (HCC)    Elevated coronary artery calcium score    a. 03/2016 Cardiac CT: Ca2+ = 78 (90th %'ile).   Fibromuscular dysplasia (HCC)    Heart murmur    History of hiatal hernia    Metatarsal fracture 2007   5th, from wearing high heels   Moderate aortic stenosis    a. 12/2019 Echo: EF 60-65%, no rwma, mild LVH, gr1 DD, nl RV size/fxn, triv MR, mild AI, mod AS, mod-sev dil of Asc Ao - 26mm.  AoV reported as tricuspid but prev known to be bicuspid.   Multiple thyroid nodules    a. 10/2019 U/S: R mid thyroid nodule - f/u 5 yrs.   Other symptoms involving abdomen and pelvis(789.9)    Renal artery stenosis (HCC)    a. 12/2019 Renal duplex: Nl RRA. LRA >60% stenosis.   Renal cyst, right    a. 12/2019 Duplex: 5.8 x 5.7 cm R renal cyst in lower pole.   Retinal hemorrhage    Symptomatic menopausal or female climacteric  states    Unspecified essential hypertension    Wears contact lenses     Past Surgical History:  Procedure Laterality Date   ABDOMINAL HYSTERECTOMY     ANKLE ARTHROSCOPY WITH RECONSTRUCTION Left 08/24/2020   Procedure: LEFT ANKLE ARTHROSCOPIC DEBRIDEMENT, EXTENSIVE, ARTHOSCROPIC TREATMENT OF TALUS OSTEOCHONDRAL LESION, LATERAL LIGAMENT RECONSTRUCTION;  Surgeon: Terance Hart, MD;  Location: [facility];  Service: Orthopedics;  Laterality: Left;   APPENDECTOMY     done during oophorectomy   CESAREAN SECTION     x 2   ETHMOIDECTOMY Left 03/02/2020   Procedure: ETHMOIDECTOMY;  Surgeon: Vernie Murders, MD;  Location: [facility];  Service: ENT;   Laterality: Left;   gyn surgery     hysterectomy- done for adenomysosis and endometriosis   IMAGE GUIDED SINUS SURGERY N/A 03/02/2020   Procedure: IMAGE GUIDED SINUS SURGERY;  Surgeon: Vernie Murders, MD;  Location: [facility];  Service: ENT;  Laterality: N/A;  need disk PLACED DISK ON OR CHARGE NURSE DESK 9-17 KP   IMAGE GUIDED SINUS SURGERY Left 04/13/2020   Procedure: IMAGE GUIDED SINUS SURGERY;  Surgeon: Vernie Murders, MD;  Location: [facility];  Service: ENT;  Laterality: Left;  needs stryker disk per Gloriajean Dell use same disk as before (October) put on charge nurses desk. DS 04/03/20   MAXILLARY ANTROSTOMY Left 03/02/2020   Procedure: MAXILLARY ANTROSTOMY;  Surgeon: Vernie Murders, MD;  Location: [facility];  Service: ENT;  Laterality: Left;   MAXILLARY ANTROSTOMY Left 04/13/2020   Procedure: MAXILLECTOMY;  Surgeon: Vernie Murders, MD;  Location: [facility];  Service: ENT;  Laterality: Left;   OOPHORECTOMY     unilateral, due to ruptured ovarian cysts   OSTEOTOMY MAXILLARY     due to bite problems   PERIPHERAL VASCULAR INTERVENTION  03/28/2021   Procedure: PERIPHERAL VASCULAR INTERVENTION;  Surgeon: Iran Ouch, MD;  Location: [facility];  Service: Cardiovascular;;   RENAL ANGIOGRAPHY N/A 03/28/2021   Procedure: RENAL ANGIOGRAPHY;  Surgeon: Iran Ouch, MD;  Location: [facility];  Service: Cardiovascular;  Laterality: N/A;   TUBAL LIGATION       Current Outpatient Medications  Medication Sig Dispense Refill   ALPRAZolam (XANAX) 0.25 MG tablet Take 1 tablet (0.25 mg total) by mouth 2 (two) times daily as needed. 60 tablet 5   amLODipine (NORVASC) 5 MG tablet Take 1 tablet (5 mg total) by mouth daily. 30 tablet 1   aspirin EC 81 MG tablet Take 1 tablet (81 mg total) by mouth daily. Swallow whole.     carvedilol (COREG) 25 MG tablet Take one tablet  by mouth 2 (two) times daily with a meal. 180 tablet 1   estradiol (ESTRACE) 0.1  MG/GM vaginal cream INSERT 1/4 APPLICATORFUL VAGINALLY TWO TIMES A WEEK AS DIRECTED (Patient taking differently: Place 0.25 Applicatorfuls vaginally 2 (two) times a week. Sundays & Thursdays) 42.5 g 5   fluticasone (FLONASE) 50 MCG/ACT nasal spray PLACE 2 SPRAYS INTO BOTH NOSTRILS DAILY. 16 g 2   hydrALAZINE (APRESOLINE) 25 MG tablet Take 1 tablet (25 mg total) by mouth 3 (three) times daily. (Patient taking differently: Take 25 mg by mouth 3 (three) times daily as needed (for high BP).) 270 tablet 3   HYDROcodone-acetaminophen (NORCO) 10-325 MG tablet Take 1 tablet by mouth every 6 (six) hours as needed for severe pain. 30 tablet 0   Menthol, Topical Analgesic, (BIOFREEZE EX) Apply 1 application topically at bedtime. Applied to neck area  metFORMIN (GLUCOPHAGE-XR) 750 MG 24 hr tablet Take 1 tablet by mouth daily with breakfast. 90 tablet 1   rosuvastatin (CRESTOR) 5 MG tablet Take 1 tablet (5 mg total) by mouth at bedtime. 90 tablet 2   Semaglutide,0.25 or 0.5MG /DOS, (OZEMPIC, 0.25 OR 0.5 MG/DOSE,) 2 MG/1.5ML SOPN Inject 0.5 mg into the skin once a week. 1.5 mL 2   sodium fluoride (SF 5000 PLUS) 1.1 % CREA dental cream Brush teeth thoroughly for 2 minutes, do not rinse afterwards. 51 g 12   telmisartan (MICARDIS) 80 MG tablet Take 1 tablet (80 mg total) by mouth daily. 90 tablet 1   traMADol (ULTRAM) 50 MG tablet TAKE 2 TABLETS (100 MG TOTAL) BY MOUTH EVERY 6 (SIX) HOURS AS NEEDED. 240 tablet 5   No current facility-administered medications for this visit.    Allergies:   Hctz [hydrochlorothiazide] and Prozac [fluoxetine hcl]    Social History:  The patient  reports that she has never smoked. She has never used smokeless tobacco. She reports current alcohol use. She reports that she does not use drugs.   Family History:  The patient's family history includes Aortic stenosis in her mother; Breast cancer (age of onset: 14) in her maternal aunt; Cancer in her father, granddaughter, and maternal  aunt; Cancer (age of onset: 1) in her grandchild; Diabetes in her mother; Melanoma in her son; Retinoblastoma in her son.    ROS:  Please see the history of present illness.   Otherwise, review of systems are positive for none.   All other systems are reviewed and negative.    PHYSICAL EXAM: VS:  BP 140/84 (BP Location: Left Arm, Patient Position: Sitting, Cuff Size: Normal)   Pulse 79   Ht 5\' 6"  (1.676 m)   Wt 166 lb 6 oz (75.5 kg)   LMP  (LMP Unknown)   SpO2 99%   BMI 26.85 kg/m  , BMI Body mass index is 26.85 kg/m. GEN: Well nourished, well developed, in no acute distress  HEENT: normal  Neck: no JVD, carotid bruits, or masses Cardiac: RRR; no  rubs, or gallops,no edema . 2/ 6 systolic murmur in the aortic area Respiratory:  clear to auscultation bilaterally, normal work of breathing GI: soft, nontender, nondistended, + BS MS: no deformity or atrophy  Skin: warm and dry, no rash Neuro:  Strength and sensation are intact Psych: euthymic mood, full affect Right groin is intact with no hematoma   EKG:  EKG is not ordered today.    Recent Labs: 04/05/2021: Hemoglobin 11.5; Platelets 329 09/21/2021: ALT 18; BUN 20; Creatinine, Ser 0.96; Potassium 4.9; Sodium 139    Lipid Panel    Component Value Date/Time   CHOL 132 09/21/2021 1418   CHOL 175 02/17/2013 0746   TRIG 63 09/21/2021 1418   TRIG 100 02/17/2013 0746   HDL 62 09/21/2021 1418   HDL 53 02/17/2013 0746   CHOLHDL 2.1 09/21/2021 1418   CHOLHDL 2 12/18/2020 0941   VLDL 17.0 12/18/2020 0941   VLDL 20 02/17/2013 0746   LDLCALC 57 09/21/2021 1418   LDLCALC 102 (H) 02/17/2013 0746   LDLDIRECT 101.0 11/11/2016 0906      Wt Readings from Last 3 Encounters:  10/25/21 166 lb 6 oz (75.5 kg)  09/21/21 163 lb 3.2 oz (74 kg)  08/14/21 174 lb (78.9 kg)           No data to display            ASSESSMENT AND PLAN:  1. Renal artery stenosis: Status post recent angioplasty and stent placement to the left  renal artery for severe proximal stenosis.   Renal artery duplex in March was reviewed with her and showed patent left renal artery stent.  Repeat study in March 2024.  2. Likely bicuspid aortic valve with ascending aortic aneurysm.  Most recent echocardiogram showed moderate aortic stenosis.  3. Ascending aortic aneurysm. Most recently this measured 48 x 49 mm. Indication for repair if this reaches 50 mm in the presence of bicuspid aortic valve.  I discussed with her the timing of surgery but I suspect that this will have to be done in the near future.  She is having imaging studies and echocardiogram done in the next few months.  4.  Hyperlipidemia: I reviewed most recent lipid profile done last month which showed an LDL of 57.  Continue atorvastatin.   Disposition:  Follow-up with me in 6 months.  Signed,  Lorine Bears, MD  10/25/2021 9:07 AM    [facility]

## 2021-10-26 ENCOUNTER — Other Ambulatory Visit: Payer: Self-pay | Admitting: Internal Medicine

## 2021-10-26 ENCOUNTER — Encounter: Payer: Self-pay | Admitting: Cardiovascular Disease

## 2021-10-26 ENCOUNTER — Other Ambulatory Visit: Payer: Self-pay

## 2021-10-26 DIAGNOSIS — I77810 Thoracic aortic ectasia: Secondary | ICD-10-CM

## 2021-10-26 MED ORDER — CARVEDILOL 25 MG PO TABS
ORAL_TABLET | Freq: Two times a day (BID) | ORAL | 1 refills | Status: DC
Start: 2021-10-26 — End: 2022-03-11
  Filled 2021-10-26: qty 180, 90d supply, fill #0
  Filled 2022-01-25: qty 180, 90d supply, fill #1

## 2021-10-30 NOTE — Telephone Encounter (Signed)
Order placed for CTA to be complete in Aug 2023.

## 2021-10-31 ENCOUNTER — Other Ambulatory Visit: Payer: Self-pay

## 2021-11-01 ENCOUNTER — Ambulatory Visit: Payer: 59

## 2021-11-29 ENCOUNTER — Other Ambulatory Visit: Payer: Self-pay

## 2021-12-18 IMAGING — CT CT ANGIO CHEST
2 of 6 series · 13 of 36 positions shown · IV contrast (omnipaque)
Comparison: 10/21/2018

CLINICAL DATA: Thoracic aortic aneurysm

EXAM:
CT ANGIOGRAPHY CHEST WITH CONTRAST
TECHNIQUE: Multidetector CT imaging of the chest was performed using the
standard protocol during bolus administration of intravenous
contrast. Multiplanar CT image reconstructions and MIPs were
obtained to evaluate the vascular anatomy.
CONTRAST:  75mL OMNIPAQUE IOHEXOL 350 MG/ML SOLN

[Series 4: axial arterial cta thorax 2.00 · axial · arterial · 0.60mm/px · z∈[-1194,-904]mm · 12 of 173 slices shown]
[im 14/173  lung]
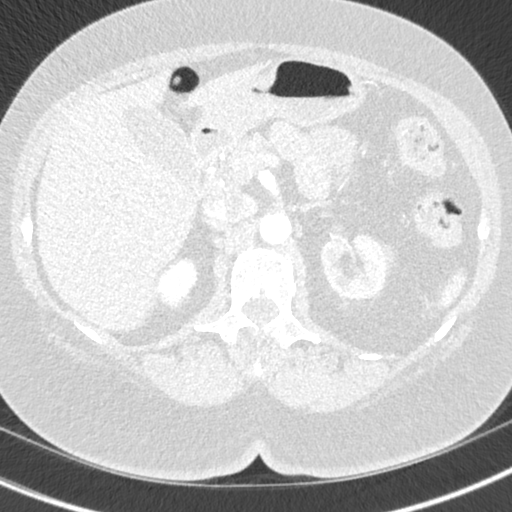
[im 27/173  mediastinal]
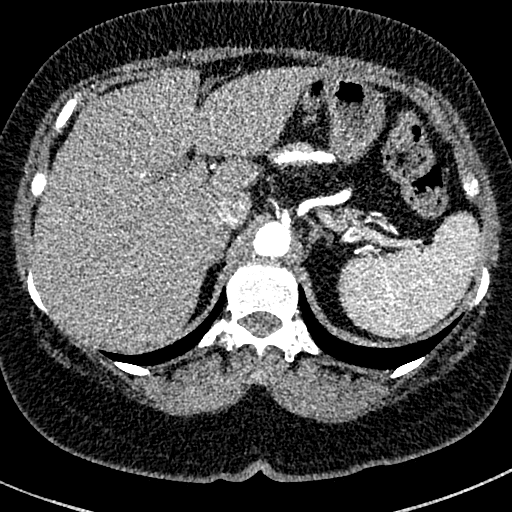
[im 40/173  lung]
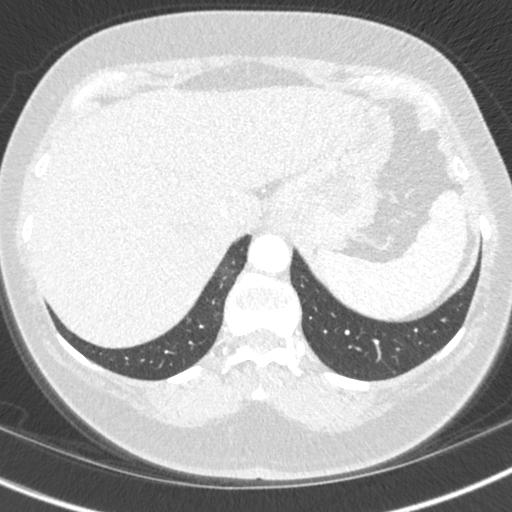
[im 53/173  mediastinal]
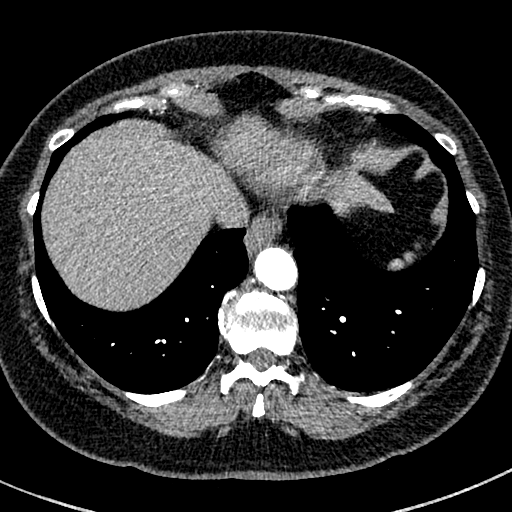
[im 67/173  lung]
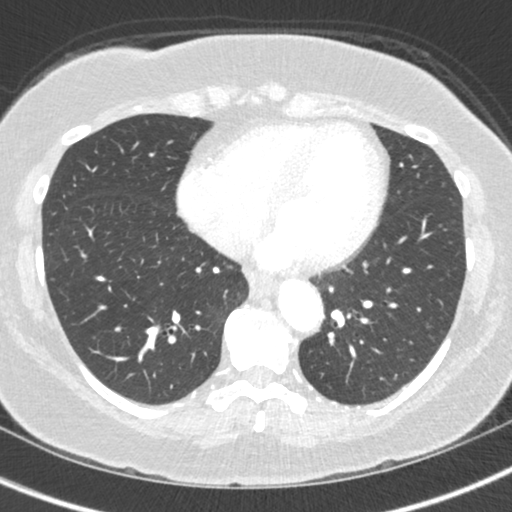
[im 80/173  mediastinal]
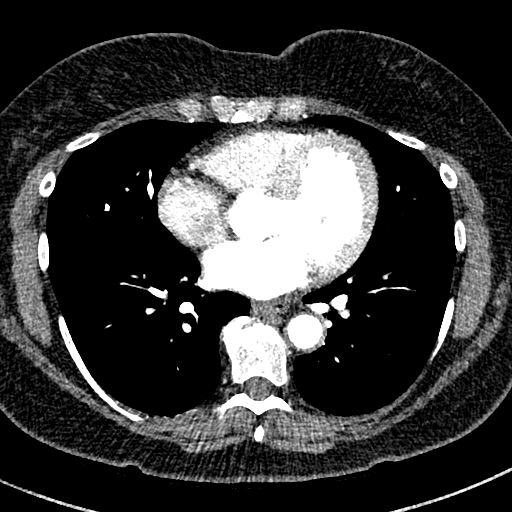
[im 93/173  lung]
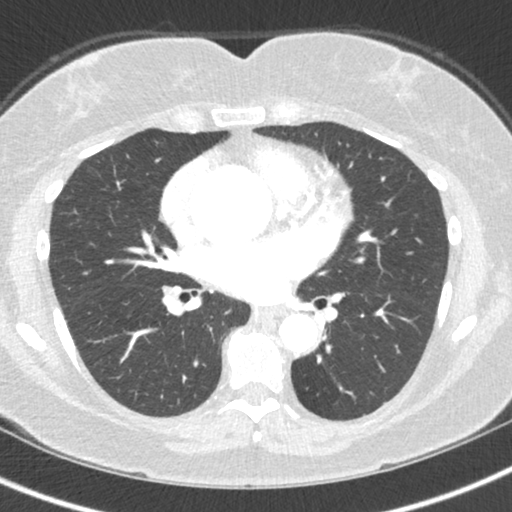
[im 106/173  mediastinal]
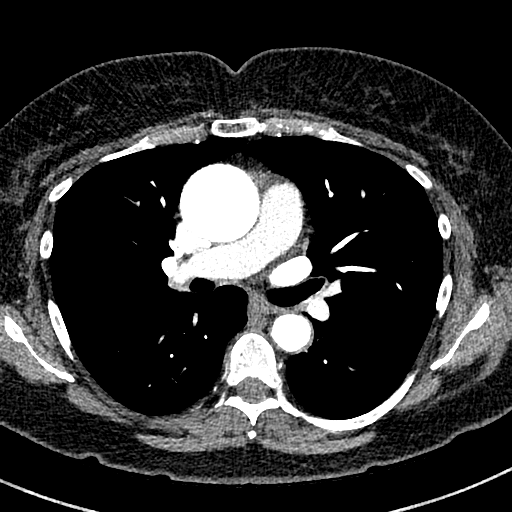
[im 120/173  lung]
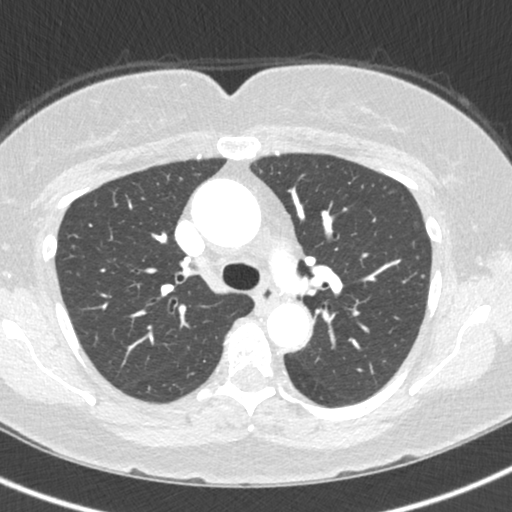
[im 133/173  mediastinal]
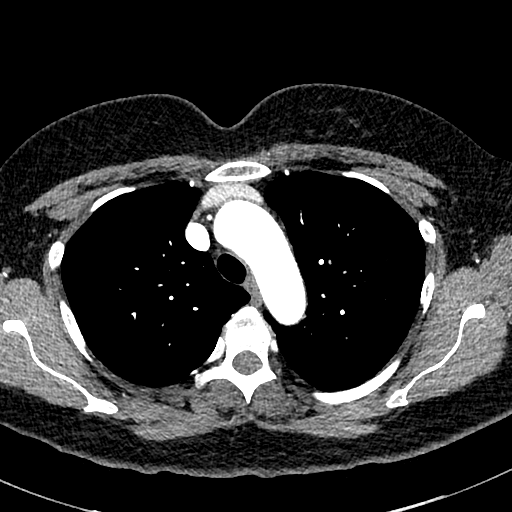
[im 146/173  lung]
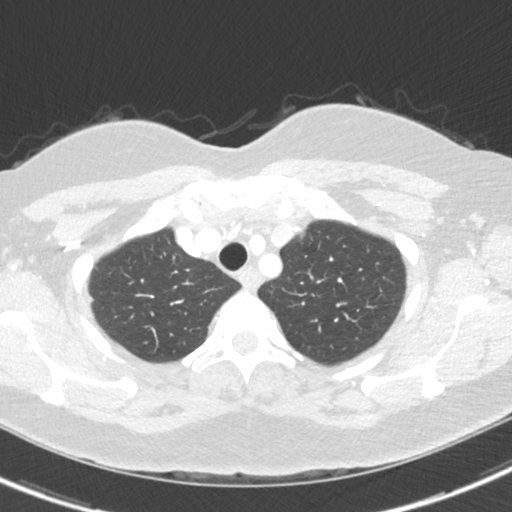
[im 159/173  mediastinal]
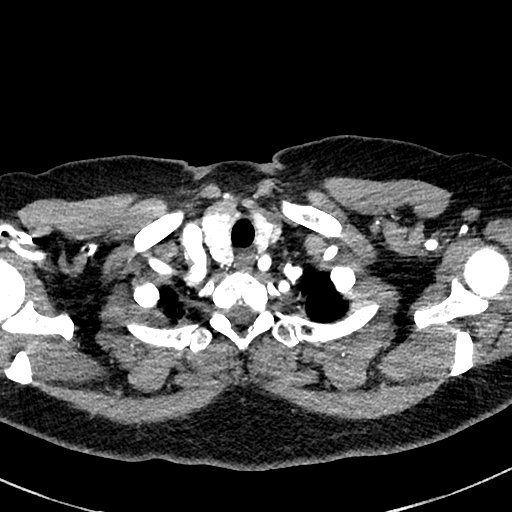

[Series 7: cor st cta thorax 2.00 cor · coronal · 0.60mm/px · 1 of 137 slices shown]
[im 69/137  mediastinal]
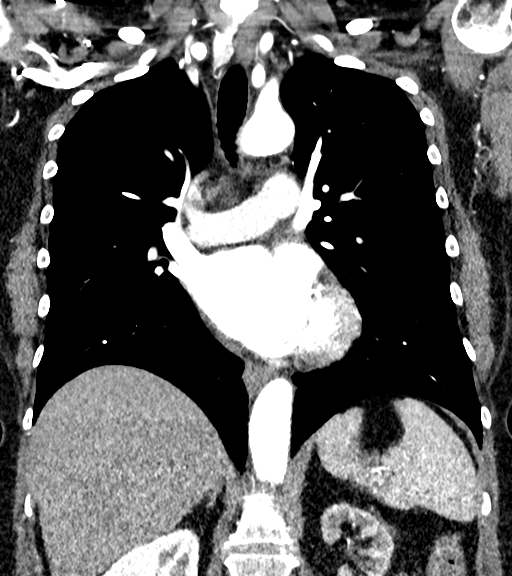

[13 of 36 positions shown; findings below may reference images not displayed]

FINDINGS: Cardiovascular: Preferential opacification of the thoracic aorta.
Stable fusiform aneurysmal dilatation of the ascending thoracic
aorta, maximal diameter 5 cm, previously 4.9 cm. Remainder of the
aorta is normal in caliber. Patent 3 vessel arch anatomy. Minor
atherosclerotic changes noted.

No mediastinal hemorrhage or hematoma. Normal heart size. Native
coronary atherosclerosis and aortic valve calcifications present. No
pericardial effusion. Visualized central pulmonary arteries are
normal in caliber and patent. No large central pulmonary embolus.

Mediastinum/Nodes: Right thyroid nodules again noted, please refer
to the prior thyroid ultrasound. Trachea central airways are patent.
Esophagus nondilated. No hiatal hernia. No bulky adenopathy.

Lungs/Pleura: No focal pneumonia, collapse or consolidation.
Negative for interstitial process or edema. Trachea and central
airways are patent.

No pleural abnormality, effusion or pneumothorax.

Upper Abdomen: Interval diffuse left renal atrophy, suspect
significant left renal ostial vascular disease when compared to
10/21/2018.

No acute upper abdominal finding.

Musculoskeletal: Degenerative changes noted of the spine. No acute
osseous finding.

Review of the MIP images confirms the above findings.
IMPRESSION: Stable fusiform aneurysmal dilatation of the ascending thoracic
aorta, maximal diameter 5 cm.

No other acute intrathoracic vascular finding.

Stable right thyroid nodules, please refer to the prior thyroid
ultrasound.

Interval left renal atrophy compared 4242 compatible with
significant left renal ostial vascular disease. Consider catheter
renal angiography and intervention.

Aortic aneurysm NOS (Q41G2-ARU.O).

Aortic Atherosclerosis (Q41G2-6TR.R).

## 2021-12-21 ENCOUNTER — Other Ambulatory Visit: Payer: Self-pay

## 2021-12-21 ENCOUNTER — Other Ambulatory Visit: Payer: Self-pay | Admitting: Internal Medicine

## 2021-12-21 MED ORDER — METFORMIN HCL ER 750 MG PO TB24
ORAL_TABLET | Freq: Every day | ORAL | 1 refills | Status: DC
  Filled 2021-12-21: qty 90, 90d supply, fill #0

## 2021-12-25 ENCOUNTER — Ambulatory Visit (INDEPENDENT_AMBULATORY_CARE_PROVIDER_SITE_OTHER): Payer: 59

## 2021-12-25 DIAGNOSIS — I359 Nonrheumatic aortic valve disorder, unspecified: Secondary | ICD-10-CM | POA: Diagnosis not present

## 2021-12-25 LAB — ECHOCARDIOGRAM COMPLETE
AR max vel: 0.77 cm2
AV Area VTI: 0.68 cm2
AV Area mean vel: 0.72 cm2
AV Mean grad: 23.2 mmHg
AV Peak grad: 41.3 mmHg
Ao pk vel: 3.22 m/s
Area-P 1/2: 1.97 cm2
S' Lateral: 2.5 cm

## 2021-12-25 MED ORDER — PERFLUTREN LIPID MICROSPHERE
1.0000 mL | INTRAVENOUS | Status: AC | PRN
  Administered 2021-12-25: 2 mL via INTRAVENOUS

## 2021-12-28 ENCOUNTER — Other Ambulatory Visit: Payer: Self-pay

## 2021-12-28 ENCOUNTER — Other Ambulatory Visit: Payer: Self-pay | Admitting: Internal Medicine

## 2021-12-28 ENCOUNTER — Ambulatory Visit
Admission: RE | Admit: 2021-12-28 | Discharge: 2021-12-28 | Disposition: A | Discharge status: Home / Self Care | Payer: 59 | Source: Ambulatory Visit | Attending: Cardiovascular Disease | Admitting: Cardiovascular Disease

## 2021-12-28 DIAGNOSIS — I77810 Thoracic aortic ectasia: Secondary | ICD-10-CM | POA: Diagnosis not present

## 2021-12-28 DIAGNOSIS — J069 Acute upper respiratory infection, unspecified: Secondary | ICD-10-CM

## 2021-12-28 DIAGNOSIS — I7121 Aneurysm of the ascending aorta, without rupture: Secondary | ICD-10-CM | POA: Diagnosis not present

## 2021-12-28 LAB — POCT I-STAT CREATININE: Creatinine, Ser: 1 mg/dL (ref 0.44–1.00)

## 2021-12-28 MED ORDER — IOHEXOL 350 MG/ML SOLN
75.0000 mL | Freq: Once | INTRAVENOUS | Status: AC | PRN
  Administered 2021-12-28: 75 mL via INTRAVENOUS

## 2021-12-28 MED ORDER — ESTRADIOL 0.1 MG/GM VA CREA
TOPICAL_CREAM | VAGINAL | 5 refills | Status: DC
  Filled 2021-12-28: qty 42.5, 90d supply, fill #0
  Filled 2022-05-09: qty 42.5, 90d supply, fill #1

## 2021-12-28 MED ORDER — FLUTICASONE PROPIONATE 50 MCG/ACT NA SUSP
2.0000 | Freq: Every day | NASAL | 2 refills | Status: DC
  Filled 2021-12-28: qty 16, 30d supply, fill #0
  Filled 2022-01-25: qty 16, 30d supply, fill #1
  Filled 2022-05-09: qty 16, 30d supply, fill #2

## 2022-01-01 NOTE — Progress Notes (Unsigned)
Cardiology Clinic Note   Patient Name: Courtney Lopez Date of Encounter: 01/02/2022  Primary Care Provider:  Sherlene Shams, MD Primary Cardiologist:  Julien Nordmann, MD  Patient Profile    61 year old female with a past medical history of renal artery stenosis, bicuspid aortic valve with ascending aortic aneurysm, hyperlipidemia, type 2 diabetes, who is here today to follow-up on palpitations, pre-syncope, and dizziness.   Past Medical History    Past Medical History:  Diagnosis Date   Ankle fracture 12/31/2018   left   Anxiety    Arthritis    neck, back, ankle, knees   Ascending aortic aneurysm (HCC)    a. 04/2016 CTA: 4.6cm Asc TAA; b. 04/2018 CTA: 4.8cm; c. 10/2018 CTA: 4.9x4.8cm; d. 12/2019 Echo: 4.8 cm.   Bicuspid aortic valve    Breast screening, unspecified    Carotid arterial disease (HCC)    a. 01/2020 U/S: 1-39% bilat ICA stenosis. No evidence of FMD.   Cervicalgia    cervical stenosis   Diabetes mellitus without complication (HCC)    Elevated coronary artery calcium score    a. 03/2016 Cardiac CT: Ca2+ = 78 (90th %'ile).   Fibromuscular dysplasia (HCC)    Heart murmur    History of hiatal hernia    Metatarsal fracture 2007   5th, from wearing high heels   Moderate aortic stenosis    a. 12/2019 Echo: EF 60-65%, no rwma, mild LVH, gr1 DD, nl RV size/fxn, triv MR, mild AI, mod AS, mod-sev dil of Asc Ao - 10mm.  AoV reported as tricuspid but prev known to be bicuspid.   Multiple thyroid nodules    a. 10/2019 U/S: R mid thyroid nodule - f/u 5 yrs.   Other symptoms involving abdomen and pelvis(789.9)    Renal artery stenosis (HCC)    a. 12/2019 Renal duplex: Nl RRA. LRA >60% stenosis.   Renal cyst, right    a. 12/2019 Duplex: 5.8 x 5.7 cm R renal cyst in lower pole.   Retinal hemorrhage    Symptomatic menopausal or female climacteric states    Unspecified essential hypertension    Wears contact lenses    Past Surgical History:  Procedure Laterality Date    ABDOMINAL HYSTERECTOMY     ANKLE ARTHROSCOPY WITH RECONSTRUCTION Left 08/24/2020   Procedure: LEFT ANKLE ARTHROSCOPIC DEBRIDEMENT, EXTENSIVE, ARTHOSCROPIC TREATMENT OF TALUS OSTEOCHONDRAL LESION, LATERAL LIGAMENT RECONSTRUCTION;  Surgeon: Terance Hart, MD;  Location: [facility];  Service: Orthopedics;  Laterality: Left;   APPENDECTOMY     done during oophorectomy   CESAREAN SECTION     x 2   ETHMOIDECTOMY Left 03/02/2020   Procedure: ETHMOIDECTOMY;  Surgeon: Vernie Murders, MD;  Location: [facility];  Service: ENT;  Laterality: Left;   gyn surgery     hysterectomy- done for adenomysosis and endometriosis   IMAGE GUIDED SINUS SURGERY N/A 03/02/2020   Procedure: IMAGE GUIDED SINUS SURGERY;  Surgeon: Vernie Murders, MD;  Location: [facility];  Service: ENT;  Laterality: N/A;  need disk PLACED DISK ON OR CHARGE NURSE DESK 9-17 KP   IMAGE GUIDED SINUS SURGERY Left 04/13/2020   Procedure: IMAGE GUIDED SINUS SURGERY;  Surgeon: Vernie Murders, MD;  Location: [facility];  Service: ENT;  Laterality: Left;  needs stryker disk per Gloriajean Dell use same disk as before (October) put on charge nurses desk. DS 04/03/20   MAXILLARY ANTROSTOMY Left 03/02/2020   Procedure: MAXILLARY ANTROSTOMY;  Surgeon: Vernie Murders, MD;  Location:  [facility];  Service: ENT;  Laterality: Left;   MAXILLARY ANTROSTOMY Left 04/13/2020   Procedure: MAXILLECTOMY;  Surgeon: Vernie Murders, MD;  Location: [facility];  Service: ENT;  Laterality: Left;   OOPHORECTOMY     unilateral, due to ruptured ovarian cysts   OSTEOTOMY MAXILLARY     due to bite problems   PERIPHERAL VASCULAR INTERVENTION  03/28/2021   Procedure: PERIPHERAL VASCULAR INTERVENTION;  Surgeon: Iran Ouch, MD;  Location: [facility];  Service: Cardiovascular;;   RENAL ANGIOGRAPHY N/A 03/28/2021   Procedure: RENAL ANGIOGRAPHY;  Surgeon: Iran Ouch, MD;  Location: [facility];   Service: Cardiovascular;  Laterality: N/A;   TUBAL LIGATION      Allergies  Allergies  Allergen Reactions   Hctz [Hydrochlorothiazide] Other (See Comments)    Decreased gfr   Prozac [Fluoxetine Hcl] Palpitations    History of Present Illness    61 year old female with past medical history of renal artery stenosis, bicuspid aortic valve with ascending aortic aneurysm, hyperlipidemia, and type 2 diabetes.  She had a CT of the aorta in 2020 which showed evidence of significant left renal artery stenosis with lack of significant calcifications.  It was suggestive of fibromuscular dysplasia.  Left renal artery stenosis was confirmed with renal artery duplex.  Repeat renal artery duplex in 2021 showed significant stenosis in the mid left renal artery with peak velocity of 439.  Carotid Doppler showed no significant disease.  Blood pressures were initially controlled with medications with stable renal function and thus renal artery angiography was not pursued.  However she had worsening renal function and blood pressure control in late 2022.  She had undergone angiography in November 2022 which showed 95% stenosis in the proximal left renal artery with no significant disease affecting the right renal artery.  Successful stent placement was done with the left renal artery.  She had a repeat renal artery duplex in March which revealed patent left renal artery stent.  She did have a recent echocardiogram that was completed that showed LVEF of 60 to 65%, no regional wall motion abnormalities, grade 1 diastolic dysfunction with impaired relaxation, mild mitral regurgitation, aortic valve has moderate calcification with moderate aortic valve stenosis by mean gradient measured 23.2 mmHg, aortic valve V-max measured 3.22 M/F, severe stenosis by aortic valve area by VTI measures 0.68 cm, borderline dilatation of the aortic root measuring 37 mm and moderate dilatation of the ascending aorta measuring 45 mm.  Recent  CT angio of the chest and aorta revealed stable 4.8 cm ascending thoracic aortic aneurysm, which was recommended semiannual imaging followed by CTA or MRA and referral to cardiothoracic surgery if not already obtained.  She returns to clinic today with several complaints that have been ongoing. She has been suffering from increased palpitations, dizziness, and pre-syncope that has associated nausea and visual changes.  She states that when she has the symptoms she has to lie down and prop her feet up and eventually will pass that she feels like she is about to pass out when all of the symptoms happen.  She denies any chest pain or chest tightness.  She has been trying to keep up with her heart rate on her Apple Watch and continues to monitor her blood pressure right regularly her blood pressure has been labile for the last several weeks.  She has continued to take a half of a blood pressure pill as previously discussed at prior appointments for lower blood pressures  and through the day as her blood pressure increases to take the other half if needed.  She has been doing that for the past several weeks and she has noted to have elevated blood pressures today.   Home Medications    Current Outpatient Medications  Medication Sig Dispense Refill   ALPRAZolam (XANAX) 0.25 MG tablet Take 1 tablet (0.25 mg total) by mouth 2 (two) times daily as needed. 60 tablet 5   amLODipine (NORVASC) 5 MG tablet Take 1 tablet (5 mg total) by mouth daily. 30 tablet 1   aspirin EC 81 MG tablet Take 1 tablet (81 mg total) by mouth daily. Swallow whole.     carvedilol (COREG) 25 MG tablet Take one tablet  by mouth 2 (two) times daily with a meal. 180 tablet 1   estradiol (ESTRACE) 0.1 MG/GM vaginal cream INSERT 1/4 APPLICATORFUL VAGINALLY TWO TIMES A WEEK AS DIRECTED 42.5 g 5   fluticasone (FLONASE) 50 MCG/ACT nasal spray PLACE 2 SPRAYS INTO BOTH NOSTRILS DAILY. 16 g 2   hydrALAZINE (APRESOLINE) 25 MG tablet Take 1 tablet (25  mg total) by mouth 3 (three) times daily. (Patient taking differently: Take 25 mg by mouth 3 (three) times daily as needed (for high BP).) 270 tablet 3   HYDROcodone-acetaminophen (NORCO) 10-325 MG tablet Take 1 tablet by mouth every 6 (six) hours as needed for severe pain. 30 tablet 0   Menthol, Topical Analgesic, (BIOFREEZE EX) Apply 1 application topically at bedtime. Applied to neck area     metFORMIN (GLUCOPHAGE-XR) 750 MG 24 hr tablet Take 1 tablet by mouth daily with breakfast. 90 tablet 1   Semaglutide, 1 MG/DOSE, 4 MG/3ML SOPN Inject 1 mg as directed once a week. 3 mL 1   sodium fluoride (SF 5000 PLUS) 1.1 % CREA dental cream Brush teeth thoroughly for 2 minutes, do not rinse afterwards. 51 g 12   telmisartan (MICARDIS) 80 MG tablet Take 1 tablet (80 mg total) by mouth daily. 90 tablet 1   traMADol (ULTRAM) 50 MG tablet TAKE 2 TABLETS (100 MG TOTAL) BY MOUTH EVERY 6 (SIX) HOURS AS NEEDED. 240 tablet 5   rosuvastatin (CRESTOR) 5 MG tablet Take 1 tablet (5 mg total) by mouth at bedtime. 90 tablet 2   No current facility-administered medications for this visit.     Family History    Family History  Problem Relation Age of Onset   Diabetes Mother    Aortic stenosis Mother    Cancer Father        leukemia   Retinoblastoma Son    Melanoma Son    Cancer Maternal Aunt        breast   Breast cancer Maternal Aunt [id number]   Cancer Grandchild 1       retinoblastoma   Cancer Granddaughter    She indicated that her mother is deceased. She indicated that her father is deceased. She indicated that the status of her son is unknown. She indicated that the status of her maternal aunt is unknown. She indicated that her grandchild is alive. She indicated that her granddaughter is alive.  Social History    Social History   Socioeconomic History   Marital status: Married    Spouse name: Not on file   Number of children: 3   Years of education: Not on file   Highest education level: Not on file   Occupational History   Not on file  Tobacco Use   Smoking status: Never  Smokeless tobacco: Never  Vaping Use   Vaping Use: Never used  Substance and Sexual Activity   Alcohol use: Yes    Comment: rare   Drug use: No   Sexual activity: Not on file  Other Topics Concern   Not on file  Social History Narrative   Lives with spouse.   Social Determinants of Health   Financial Resource Strain: Not on file  Food Insecurity: Not on file  Transportation Needs: Not on file  Physical Activity: Not on file  Stress: Not on file  Social Connections: Not on file  Intimate Partner Violence: Not on file     Review of Systems    General:  No chills, fever, night sweats or weight changes.  Endorses fatigue Cardiovascular:  No chest pain, endorses exertional dyspnea dyspnea on exertion, denies edema, orthopnea, endorses palpitations palpitations, denies paroxysmal nocturnal dyspnea. Dermatological: No rash, lesions/masses Respiratory: No cough, endorses exertional dyspnea Urologic: No hematuria, dysuria Abdominal:   No nausea, vomiting, diarrhea, bright red blood per rectum, melena, or hematemesis Neurologic:  No visual changes, wkns, changes in mental status. All other systems reviewed and are otherwise negative except as noted above.   Physical Exam    VS:  BP (!) 154/96 (BP Location: Left Arm, Patient Position: Sitting, Cuff Size: Normal)   Pulse 76   Ht 5\' 6"  (1.676 m)   Wt 163 lb 3.2 oz (74 kg)   LMP  (LMP Unknown)   SpO2 (!) 76%   BMI 26.34 kg/m  , BMI Body mass index is 26.34 kg/m.     Vitals:   01/02/22 1042 01/02/22 1100  BP: (!) 160/100 (!) 154/96   GEN: Well nourished, well developed, in no acute distress. HEENT: normal. Neck: Supple, no JVD, carotid bruits, or masses. Cardiac: RRR, II/VI systolic murmur that radiates into the bilateral carotids, without rubs, or gallops. No clubbing, cyanosis, edema.  Radials/DP/PT 2+ and equal bilaterally.  Respiratory:   Respirations regular and unlabored, clear to auscultation bilaterally. GI: Soft, nontender, nondistended, BS + x 4. MS: no deformity or atrophy. Skin: warm and dry, no rash. Neuro:  Strength and sensation are intact. Psych: Normal affect.  Accessory Clinical Findings    ECG personally reviewed by me today-sinus rhythm with a rate of 75 with a left axis deviation- No acute changes  Lab Results  Component Value Date   WBC 12.8 (H) 04/05/2021   HGB 11.5 (L) 04/05/2021   HCT 35.2 (L) 04/05/2021   MCV 84.8 04/05/2021   PLT 329 04/05/2021   Lab Results  Component Value Date   CREATININE 1.00 12/28/2021   BUN 20 09/21/2021   NA 139 09/21/2021   K 4.9 09/21/2021   CL 100 09/21/2021   CO2 24 09/21/2021   Lab Results  Component Value Date   ALT 18 09/21/2021   AST 15 09/21/2021   ALKPHOS 84 09/21/2021   BILITOT 0.6 09/21/2021   Lab Results  Component Value Date   CHOL 132 09/21/2021   HDL 62 09/21/2021   LDLCALC 57 09/21/2021   LDLDIRECT 101.0 11/11/2016   TRIG 63 09/21/2021   CHOLHDL 2.1 09/21/2021    Lab Results  Component Value Date   HGBA1C 5.8 (H) 09/21/2021    Assessment & Plan   1.  Palpitations with associated symptoms of dizziness presyncope nausea and visual changes.  Patient has been keeping up with her blood pressure and heart rate and has no associated findings to his symptoms.  She is also  staying hydrated.  She has been continued on carvedilol 25 mg twice daily. When she has these episodes she has to lie down and raise her feet above her head appearing to be more orthostatic.  For the palpitations we will place her on a ZIO XT monitor for 2 weeks.  Findings on cardiac monitor, and she continues to remain symptomatic, consideration for further work-up for aortic valve and aneurysm repair.  2.  Renal artery stenosis status post recent angioplasty and stent places to the left renal artery for severe proximal stenosis.  Renal artery duplex in March was reviewed  at her previous visit and she has a repeat study scheduled for March 2024.  Her blood pressure has improved with more normalization since her renal stent has been placed.  3.  Heart murmur likely from bicuspid aortic valve with ascending aortic aneurysm on most recent echocardiogram showed moderate aortic stenosis with mean gradient measure 23.2 mmHg, aortic valve V-max measures 3.22 m/s, severe stenosis by aortic valve area with VTI measuring 0.68 cm.   4.  Hyperlipidemia with a last LDL of 57 on 09/21/2021.  She is continue on her rosuvastatin.  She does require refill of rosuvastatin today to be sent to the pharmacy of choice.   5.  Hypertension with blood pressure today of 160/100 with a recheck of 150/96.  She is to continue with her current blood pressure regimen of amlodipine, carvedilol, hydralazine, spironolactone, and telmisartan.  She adjust the doses of these medications as needed depending on what her blood pressure readings are.  Has been encouraged to continue monitoring pressures at home.  6.  Ascending aortic aneurysm most recently measured 48 mm and prior study revealed 86mm, indication for repair is at 50 mm in the presence of the bicuspid aortic valve.  7. Disposition: patient to return to clinic in 6 weeks to follow up with MD/APP  Amariyah Bazar, NP 01/02/2022, 12:45 PM

## 2022-01-02 ENCOUNTER — Encounter: Payer: Self-pay | Admitting: Cardiology

## 2022-01-02 ENCOUNTER — Ambulatory Visit (INDEPENDENT_AMBULATORY_CARE_PROVIDER_SITE_OTHER): Payer: 59 | Admitting: Cardiology

## 2022-01-02 ENCOUNTER — Other Ambulatory Visit: Payer: Self-pay

## 2022-01-02 ENCOUNTER — Ambulatory Visit (INDEPENDENT_AMBULATORY_CARE_PROVIDER_SITE_OTHER): Payer: 59

## 2022-01-02 VITALS — BP 154/96 | HR 76 | Ht 66.0 in | Wt 163.2 lb

## 2022-01-02 DIAGNOSIS — R002 Palpitations: Secondary | ICD-10-CM | POA: Diagnosis not present

## 2022-01-02 DIAGNOSIS — E785 Hyperlipidemia, unspecified: Secondary | ICD-10-CM | POA: Diagnosis not present

## 2022-01-02 DIAGNOSIS — I701 Atherosclerosis of renal artery: Secondary | ICD-10-CM | POA: Diagnosis not present

## 2022-01-02 DIAGNOSIS — I77810 Thoracic aortic ectasia: Secondary | ICD-10-CM | POA: Diagnosis not present

## 2022-01-02 DIAGNOSIS — I10 Essential (primary) hypertension: Secondary | ICD-10-CM

## 2022-01-02 DIAGNOSIS — Q231 Congenital insufficiency of aortic valve: Secondary | ICD-10-CM | POA: Diagnosis not present

## 2022-01-02 MED ORDER — ROSUVASTATIN CALCIUM 5 MG PO TABS
5.0000 mg | ORAL_TABLET | Freq: Every day | ORAL | 2 refills | Status: DC
  Filled 2022-01-02: qty 90, 90d supply, fill #0
  Filled 2022-05-09: qty 90, 90d supply, fill #1
  Filled 2022-11-07: qty 90, 90d supply, fill #2

## 2022-01-02 NOTE — Patient Instructions (Signed)
Medication Instructions:   Your physician recommends that you continue on your current medications as directed. Please refer to the Current Medication list given to you today.  *If you need a refill on your cardiac medications before your next appointment, please call your pharmacy*   Testing/Procedures:  Your physician has recommended that you wear a Zio XT monitor for 2 weeks. This will be mailed to your home address in 4-5 business days.   Your clinician has requested a Zio heart rhythm monitor by [employer] to be mailed to your home for you to wear for 14 days. You should expect a small box to arrive via USPS (or FedEx in some cases) within this next week. If you do not receive it please call [employer] at [phone].  Closely watching your heart at this time will help your care team understand more and provide information needed to develop your plan of care.  Please apply your Zio patch monitor the day you receive it. Keep this packaging, you will use this to return your Zio monitor.  You will easily be able to apply the monitor with the instructions provided in the Patient Guide.  If you need assistance, [employer] representatives are available 24/7 at [phone].  You can also download the [facility] app on your phone to view detailed application instructions and log symptoms.  After you wear your monitor for 14 days, place it back in the blue box or envelope, along with your Symptom Log.  To send your monitor back: Simply use the pre-addressed and pre-paid box/envelope.  Send it back through [employer] the same day you remove it via your local post office or by placing it in your mailbox.  As soon as we receive the results, they will be reviewed and your clinician will contact you.  For the first 24 hours- it is essential to not shower or exercise, to allow the patch to adhere to your skin. Avoid excessive sweating to help maximize wear time. Do not submerge the device, no hot tubs,  and no swimming pools. Keep any lotions or oils away from the patch. After 24 hours you may shower with the patch on. Take brief showers with your back facing the shower head.  Do not remove patch once it has been placed because that will interrupt data and decrease adhesive wear time. Push the button when you have any symptoms and write down what you were feeling. Once you have completed wearing your monitor, remove and place into box which has postage paid and place in your outgoing mailbox.  If for some reason you have misplaced your box then call our office and we can provide another box and/or mail it off for you.    Follow-Up: At [facility], you and your health needs are our priority.  As part of our continuing mission to provide you with exceptional heart care, we have created designated Provider Care Teams.  These Care Teams include your primary Cardiologist (physician) and Advanced Practice Providers (APPs -  Physician Assistants and Nurse Practitioners) who all work together to provide you with the care you need, when you need it.  We recommend signing up for the patient portal called "MyChart".  Sign up information is provided on this After Visit Summary.  MyChart is used to connect with patients for Virtual Visits (Telemedicine).  Patients are able to view lab/test results, encounter notes, upcoming appointments, etc.  Non-urgent messages can be sent to your provider as well.   To learn more  about what you can do with MyChart, go to [email or web address].    Your next appointment:   6 week(s)  The format for your next appointment:   In Person  Provider:   You may see Julien Nordmann, MD or one of the following Advanced Practice Providers on your designated Care Team:   [employer]   Other Instructions   Important Information About Sugar

## 2022-01-04 ENCOUNTER — Other Ambulatory Visit: Payer: Self-pay | Admitting: Internal Medicine

## 2022-01-04 ENCOUNTER — Ambulatory Visit: Payer: 59 | Admitting: Medical

## 2022-01-04 ENCOUNTER — Other Ambulatory Visit: Payer: Self-pay

## 2022-01-06 DIAGNOSIS — R002 Palpitations: Secondary | ICD-10-CM

## 2022-01-09 ENCOUNTER — Other Ambulatory Visit: Payer: Self-pay

## 2022-01-09 ENCOUNTER — Other Ambulatory Visit: Payer: Self-pay | Admitting: Internal Medicine

## 2022-01-09 MED FILL — Tramadol HCl Tab 50 MG: ORAL | 30 days supply | Qty: 240 | Fill #0 | Status: AC

## 2022-01-10 ENCOUNTER — Other Ambulatory Visit: Payer: Self-pay

## 2022-01-11 ENCOUNTER — Ambulatory Visit: Payer: 59 | Admitting: Cardiovascular Disease

## 2022-01-15 ENCOUNTER — Telehealth: Payer: Self-pay | Admitting: *Deleted

## 2022-01-15 ENCOUNTER — Other Ambulatory Visit: Payer: Self-pay

## 2022-01-15 MED FILL — Telmisartan Tab 80 MG: ORAL | 90 days supply | Qty: 90 | Fill #1 | Status: AC

## 2022-01-15 NOTE — Telephone Encounter (Signed)
Patient contacted the office c/o SOB and dizziness with minimal physical exertion. Patient was seen in consultation for TAA by Dr. Laneta Simmers 12/2020. Per patient, she had an ECHO following her consultation which showed moderate aortic stenosis. Patient stated she wanted to wait until her stenosis worsened prior to undergoing surgery. Patient requesting follow up appt with Dr. Laneta Simmers. Appt made for patient to be seen next week. Patient aware of appt date/time.

## 2022-01-17 ENCOUNTER — Observation Stay
Admission: EM | Admit: 2022-01-17 | Discharge: 2022-01-19 | Disposition: A | Discharge status: Home / Self Care | Payer: 59 | Attending: Internal Medicine | Admitting: Internal Medicine

## 2022-01-17 ENCOUNTER — Observation Stay: Payer: 59

## 2022-01-17 ENCOUNTER — Telehealth: Payer: Self-pay

## 2022-01-17 DIAGNOSIS — I77810 Thoracic aortic ectasia: Secondary | ICD-10-CM | POA: Diagnosis present

## 2022-01-17 DIAGNOSIS — I110 Hypertensive heart disease with heart failure: Secondary | ICD-10-CM | POA: Diagnosis not present

## 2022-01-17 DIAGNOSIS — I509 Heart failure, unspecified: Secondary | ICD-10-CM | POA: Diagnosis not present

## 2022-01-17 DIAGNOSIS — Z8616 Personal history of COVID-19: Secondary | ICD-10-CM | POA: Diagnosis present

## 2022-01-17 DIAGNOSIS — I701 Atherosclerosis of renal artery: Secondary | ICD-10-CM | POA: Diagnosis present

## 2022-01-17 DIAGNOSIS — Z20822 Contact with and (suspected) exposure to covid-19: Secondary | ICD-10-CM | POA: Diagnosis not present

## 2022-01-17 DIAGNOSIS — Q2381 Bicuspid aortic valve: Secondary | ICD-10-CM

## 2022-01-17 DIAGNOSIS — E782 Mixed hyperlipidemia: Secondary | ICD-10-CM | POA: Insufficient documentation

## 2022-01-17 DIAGNOSIS — I951 Orthostatic hypotension: Secondary | ICD-10-CM | POA: Diagnosis not present

## 2022-01-17 DIAGNOSIS — Z79899 Other long term (current) drug therapy: Secondary | ICD-10-CM | POA: Insufficient documentation

## 2022-01-17 DIAGNOSIS — Z952 Presence of prosthetic heart valve: Secondary | ICD-10-CM | POA: Diagnosis not present

## 2022-01-17 DIAGNOSIS — E1169 Type 2 diabetes mellitus with other specified complication: Secondary | ICD-10-CM | POA: Diagnosis not present

## 2022-01-17 DIAGNOSIS — R531 Weakness: Secondary | ICD-10-CM | POA: Diagnosis not present

## 2022-01-17 DIAGNOSIS — I2510 Atherosclerotic heart disease of native coronary artery without angina pectoris: Secondary | ICD-10-CM | POA: Insufficient documentation

## 2022-01-17 DIAGNOSIS — N179 Acute kidney failure, unspecified: Secondary | ICD-10-CM | POA: Insufficient documentation

## 2022-01-17 DIAGNOSIS — I10 Essential (primary) hypertension: Secondary | ICD-10-CM | POA: Diagnosis present

## 2022-01-17 DIAGNOSIS — Z7982 Long term (current) use of aspirin: Secondary | ICD-10-CM | POA: Diagnosis not present

## 2022-01-17 DIAGNOSIS — I160 Hypertensive urgency: Secondary | ICD-10-CM | POA: Diagnosis not present

## 2022-01-17 DIAGNOSIS — Z7984 Long term (current) use of oral hypoglycemic drugs: Secondary | ICD-10-CM | POA: Diagnosis not present

## 2022-01-17 DIAGNOSIS — E1121 Type 2 diabetes mellitus with diabetic nephropathy: Secondary | ICD-10-CM

## 2022-01-17 DIAGNOSIS — I712 Thoracic aortic aneurysm, without rupture, unspecified: Secondary | ICD-10-CM | POA: Diagnosis present

## 2022-01-17 DIAGNOSIS — E876 Hypokalemia: Secondary | ICD-10-CM | POA: Diagnosis not present

## 2022-01-17 DIAGNOSIS — Q231 Congenital insufficiency of aortic valve: Secondary | ICD-10-CM

## 2022-01-17 DIAGNOSIS — R55 Syncope and collapse: Secondary | ICD-10-CM | POA: Diagnosis present

## 2022-01-17 LAB — BASIC METABOLIC PANEL
Anion gap: 12 (ref 5–15)
BUN: 26 mg/dL — ABNORMAL HIGH (ref 8–23)
CO2: 26 mmol/L (ref 22–32)
Calcium: 9.9 mg/dL (ref 8.9–10.3)
Chloride: 100 mmol/L (ref 98–111)
Creatinine, Ser: 1.12 mg/dL — ABNORMAL HIGH (ref 0.44–1.00)
GFR, Estimated: 55 mL/min — ABNORMAL LOW (ref >60)
Glucose, Bld: 109 mg/dL — ABNORMAL HIGH (ref 70–99)
Potassium: 3.9 mmol/L (ref 3.5–5.1)
Sodium: 138 mmol/L (ref 135–145)

## 2022-01-17 LAB — URINALYSIS, ROUTINE W REFLEX MICROSCOPIC
Bilirubin Urine: NEGATIVE
Glucose, UA: NEGATIVE mg/dL
Ketones, ur: NEGATIVE mg/dL
Leukocytes,Ua: NEGATIVE
Nitrite: NEGATIVE
Protein, ur: NEGATIVE mg/dL
Specific Gravity, Urine: 1.009 (ref 1.005–1.030)
WBC, UA: NONE SEEN WBC/hpf (ref 0–5)
pH: 6 (ref 5.0–8.0)

## 2022-01-17 LAB — CBC
HCT: 40.9 % (ref 36.0–46.0)
Hemoglobin: 13.1 g/dL (ref 12.0–15.0)
MCH: 27.3 pg (ref 26.0–34.0)
MCHC: 32 g/dL (ref 30.0–36.0)
MCV: 85.4 fL (ref 80.0–100.0)
Platelets: 297 10*3/uL (ref 150–400)
RBC: 4.79 MIL/uL (ref 3.87–5.11)
RDW: 12.3 % (ref 11.5–15.5)
WBC: 9.1 10*3/uL (ref 4.0–10.5)
nRBC: 0 % (ref 0.0–0.2)

## 2022-01-17 LAB — TROPONIN I (HIGH SENSITIVITY): Troponin I (High Sensitivity): 6 ng/L (ref <18)

## 2022-01-17 LAB — GLUCOSE, CAPILLARY: Glucose-Capillary: 93 mg/dL (ref 70–99)

## 2022-01-17 LAB — SARS CORONAVIRUS 2 BY RT PCR: SARS Coronavirus 2 by RT PCR: NEGATIVE

## 2022-01-17 MED ORDER — HYDRALAZINE HCL 20 MG/ML IJ SOLN
5.0000 mg | Freq: Four times a day (QID) | INTRAMUSCULAR | Status: DC | PRN
  Administered 2022-01-18: 5 mg via INTRAVENOUS
  Filled 2022-01-17: qty 1

## 2022-01-17 MED ORDER — METFORMIN HCL ER 750 MG PO TB24
750.0000 mg | ORAL_TABLET | Freq: Every day | ORAL | Status: DC
  Administered 2022-01-18: 750 mg via ORAL
  Filled 2022-01-17: qty 1

## 2022-01-17 MED ORDER — ENOXAPARIN SODIUM 40 MG/0.4ML IJ SOSY
40.0000 mg | PREFILLED_SYRINGE | Freq: Every day | INTRAMUSCULAR | Status: DC
  Administered 2022-01-18 (×2): 40 mg via SUBCUTANEOUS
  Filled 2022-01-17 (×2): qty 0.4

## 2022-01-17 MED ORDER — ONDANSETRON HCL 4 MG/2ML IJ SOLN
4.0000 mg | Freq: Four times a day (QID) | INTRAMUSCULAR | Status: DC | PRN
Start: 2022-01-17 — End: 2022-01-19

## 2022-01-17 MED ORDER — TRAMADOL HCL 50 MG PO TABS
50.0000 mg | ORAL_TABLET | Freq: Four times a day (QID) | ORAL | Status: DC | PRN
  Administered 2022-01-17 – 2022-01-18 (×3): 50 mg via ORAL
  Filled 2022-01-17 (×3): qty 1

## 2022-01-17 MED ORDER — INSULIN ASPART 100 UNIT/ML IJ SOLN: 0.0000 [IU] | Freq: Three times a day (TID) | INTRAMUSCULAR | Status: DC

## 2022-01-17 MED ORDER — ONDANSETRON HCL 4 MG PO TABS: 4.0000 mg | ORAL_TABLET | Freq: Four times a day (QID) | ORAL | Status: DC | PRN

## 2022-01-17 MED ORDER — ASPIRIN 81 MG PO TBEC
81.0000 mg | DELAYED_RELEASE_TABLET | Freq: Every day | ORAL | Status: DC
  Administered 2022-01-18 – 2022-01-19 (×2): 81 mg via ORAL
  Filled 2022-01-17 (×2): qty 1

## 2022-01-17 MED ORDER — HYDRALAZINE HCL 50 MG PO TABS: 25.0000 mg | ORAL_TABLET | Freq: Three times a day (TID) | ORAL | Status: DC | PRN

## 2022-01-17 MED ORDER — ALPRAZOLAM 0.25 MG PO TABS
0.2500 mg | ORAL_TABLET | Freq: Two times a day (BID) | ORAL | Status: DC | PRN
Start: 2022-01-17 — End: 2022-01-19
  Administered 2022-01-18 (×2): 0.25 mg via ORAL
  Filled 2022-01-17 (×2): qty 1

## 2022-01-17 MED ORDER — ROSUVASTATIN CALCIUM 5 MG PO TABS
5.0000 mg | ORAL_TABLET | Freq: Every day | ORAL | Status: DC
  Administered 2022-01-18 (×2): 5 mg via ORAL
  Filled 2022-01-17 (×3): qty 1

## 2022-01-17 MED ORDER — INSULIN ASPART 100 UNIT/ML IJ SOLN: 0.0000 [IU] | Freq: Every day | INTRAMUSCULAR | Status: DC

## 2022-01-17 MED ORDER — CARVEDILOL 25 MG PO TABS
25.0000 mg | ORAL_TABLET | Freq: Once | ORAL | Status: AC
  Administered 2022-01-17: 25 mg via ORAL
  Filled 2022-01-17: qty 1

## 2022-01-17 MED ORDER — ACETAMINOPHEN 650 MG RE SUPP
650.0000 mg | Freq: Four times a day (QID) | RECTAL | Status: DC | PRN
Start: 2022-01-17 — End: 2022-01-19

## 2022-01-17 MED ORDER — LABETALOL HCL 5 MG/ML IV SOLN
10.0000 mg | Freq: Once | INTRAVENOUS | Status: AC
  Administered 2022-01-17: 10 mg via INTRAVENOUS

## 2022-01-17 MED ORDER — IRBESARTAN 150 MG PO TABS
300.0000 mg | ORAL_TABLET | Freq: Every day | ORAL | Status: DC
  Administered 2022-01-18 – 2022-01-19 (×2): 300 mg via ORAL
  Filled 2022-01-17 (×2): qty 2

## 2022-01-17 MED ORDER — CARVEDILOL 25 MG PO TABS
25.0000 mg | ORAL_TABLET | Freq: Two times a day (BID) | ORAL | Status: DC
  Administered 2022-01-18 – 2022-01-19 (×3): 25 mg via ORAL
  Filled 2022-01-17 (×3): qty 1

## 2022-01-17 MED ORDER — SENNOSIDES-DOCUSATE SODIUM 8.6-50 MG PO TABS: 1.0000 | ORAL_TABLET | Freq: Every evening | ORAL | Status: DC | PRN

## 2022-01-17 MED ORDER — ACETAMINOPHEN 325 MG PO TABS: 650.0000 mg | ORAL_TABLET | Freq: Four times a day (QID) | ORAL | Status: DC | PRN

## 2022-01-17 NOTE — H&P (Addendum)
History and Physical   Courtney Lopez LKG:[id number] DOB: 05-Jan-1961 DOA: 01/17/2022  PCP: Crecencio Mc, MD  Outpatient Specialists: Dr. Rockey Situ, [facility] Cardiology Patient coming from: Home  I have personally briefly reviewed patient's old medical records in [place].  Chief Concern: Dizziness, near syncope  HPI: [name] is a 61 year old female with history of left artery stenosis status post stent placement in November 2022, bicuspid aortic valve, hyperlipidemia, non-insulin-dependent diabetes mellitus, heart failure preserved ejection fraction, moderate aortic valve stenosis, who presents emergency department from home for chief concerns of near syncopal episode.  Initial vitals in the emergency department showed temperature 98.1, respiration rate of 8, heart rate of 79, blood pressure 212/122, SPO2 100% on room air.  Serum sodium is 138, potassium 3.9, chloride 100, bicarb 26, BUN of 26, serum creatinine 1.12, eGFR 55, nonfasting blood glucose 109, WBC 9.1, hemoglobin 13.1, platelets of 297.  High sensitive troponin was 6.  UA was negative for leukocytes and nitrates.  ED treatment: Carvedilol 25 mg p.o. one-time dose, labetalol 100 mg IV.  At bedside patient was able to tell me her name, her age, the current calendar year and she knows she is in the hospital.  She is a also able to identify her son [name] at bedside.  She presents to the ED for chief concerns of multiple episodes of near syncope while sitting at her desk.  She states these episodes are frequent and occurs every hour.  Prior to that she was able to last every 5 hours and it was well she was moving and/or standing.  Today the near syncope and dizziness is while she was sitting at her desk.  She denies chest pain, shortness of breath, abdominal pain, dysuria, diarrhea.  She endorses chronic back pain and states it is not sharp or searing.  She endorses intentional weight loss of approximately 30  pounds due to being on semaglutide.  Social history: She lives at home with her husband who is disabled.  She denies tobacco, EtOH, recreational drug use.  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  ROS: Constitutional: + weight change, no fever ENT/Mouth: no sore throat, no rhinorrhea Eyes: no eye pain, no vision changes Cardiovascular: no chest pain, no dyspnea,  no edema, no palpitations Respiratory: no cough, no sputum, no wheezing Gastrointestinal: no nausea, no vomiting, no diarrhea, no constipation Genitourinary: no urinary incontinence, no dysuria, no hematuria Musculoskeletal: no arthralgias, no myalgias Skin: no skin lesions, no pruritus, Neuro: + weakness, no loss of consciousness, + near syncope Psych: no anxiety, no depression, no decrease appetite Heme/Lymph: no bruising, no bleeding  ED Course: Discussed with emergency medicine provider, patient requiring hospitalization for chief concerns of hypertensive urgency.  Assessment/Plan  Principal Problem:   Hypertensive urgency Active Problems:   Hypertension   Hyperlipidemia associated with type 2 diabetes mellitus ([place])   DM type 2 with diabetic mixed hyperlipidemia ([place])   Ascending aorta dilatation (HCC)   Bicuspid aortic valve   Thoracic aortic aneurysm without rupture ([place])   Renal artery stenosis (HCC)   Personal history of COVID-19   Near syncope   Assessment and Plan:  * Hypertensive urgency - Outpatient PCP has been holding carvedilol due to concerns for hypotension causing dizziness/near syncope at home - Status post carvedilol 25 mg p.o. one-time dose in the ED - I have resumed carvedilol 25 mg p.o. twice daily for 01/18/2022 - Telmisartan 80 mg equivalent (irbesartan 300 mg daily)  resumed for 01/18/22 - Hydralazine 25 mg p.o. 3 times daily as needed for elevated SBP has been held on admission - I have ordered hydralazine 5 mg IV every 6 hours as needed for SBP greater than  180, 2 days ordered - Patient requesting cardiology consultation tomorrow with Dr. Fletcher Anon - Admit to progressive cardiac, observation  Near syncope With dizziness while sitting With increased frequency, multiple episodes on day of admission Blurry vision - Neurology has been consulted via staff message to Dr. Rory Percy who recommends MRI of the brain without contrast and EEG - If MRI and/or EEG is abnormal, a.m. team to reconsult neurology - Admit to PCU - Fall precautions  Personal history of COVID-19 - COVID admission test ordered  Renal artery stenosis ([place]) - Status post stent placement on November 2022 and recent renal duplex in March 2023 showing patent stents  Hyperlipidemia associated with type 2 diabetes mellitus ([place]) - Non-insulin-dependent - Rosuvastatin 5 mg nightly resume - Metformin 750 mg p.o. daily before breakfast resumed - Home semaglutide injection has not been resumed while inpatient - Insulin SSI with at bedtime coverage ordered - CBG check 3 times daily AC at bedtime ordered - Goal inpatient blood glucose levels 140-180  Hypertension - Resumed home carvedilol 25 mg p.o. twice daily, irbesartan 300 mg daily  Chart reviewed.   12/25/2021 complete echo: LV ejection fraction estimated at 60 to 36%, grade 1 diastolic dysfunction.  Moderate aortic valve stenosis.  Aortic valve has moderate calcification.  DVT prophylaxis: Enoxaparin Code Status: Full code Diet: Heart healthy/carb modified Family Communication: Updated son [name] at bedside with patient's permission Disposition Plan: Pending clinical course Admission status: Progressive cardiac, observation  Past Medical History:  Diagnosis Date   Ankle fracture 12/31/2018   left   Anxiety    Arthritis    neck, back, ankle, knees   Ascending aortic aneurysm ([place])    a. 04/2016 CTA: 4.6cm Asc TAA; b. 04/2018 CTA: 4.8cm; c. 10/2018 CTA: 4.9x4.8cm; d. 12/2019 Echo: 4.8 cm.   Bicuspid aortic valve    Breast  screening, unspecified    Carotid arterial disease ([place])    a. 01/2020 U/S: 1-39% bilat ICA stenosis. No evidence of FMD.   Cervicalgia    cervical stenosis   Diabetes mellitus without complication (HCC)    Elevated coronary artery calcium score    a. 03/2016 Cardiac CT: Ca2+ = 78 (90th %'ile).   Fibromuscular dysplasia (HCC)    Heart murmur    History of hiatal hernia    Metatarsal fracture 2007   5th, from wearing high heels   Moderate aortic stenosis    a. 12/2019 Echo: EF 60-65%, no rwma, mild LVH, gr1 DD, nl RV size/fxn, triv MR, mild AI, mod AS, mod-sev dil of Asc Ao - 27m.  AoV reported as tricuspid but prev known to be bicuspid.   Multiple thyroid nodules    a. 10/2019 U/S: R mid thyroid nodule - f/u 5 yrs.   Other symptoms involving abdomen and pelvis(789.9)    Renal artery stenosis ([place]    a. 12/2019 Renal duplex: Nl RRA. LRA >60% stenosis.   Renal cyst, right    a. 12/2019 Duplex: 5.8 x 5.7 cm R renal cyst in lower pole.   Retinal hemorrhage    Symptomatic menopausal or female climacteric states    Unspecified essential hypertension    Wears contact lenses    Past Surgical History:  Procedure Laterality Date   ABDOMINAL HYSTERECTOMY     ANKLE  ARTHROSCOPY WITH RECONSTRUCTION Left 08/24/2020   Procedure: LEFT ANKLE ARTHROSCOPIC DEBRIDEMENT, EXTENSIVE, ARTHOSCROPIC TREATMENT OF TALUS OSTEOCHONDRAL LESION, LATERAL LIGAMENT RECONSTRUCTION;  Surgeon: Erle Crocker, MD;  Location: [place];  Service: Orthopedics;  Laterality: Left;   APPENDECTOMY     done during oophorectomy   CESAREAN SECTION     x 2   ETHMOIDECTOMY Left 03/02/2020   Procedure: ETHMOIDECTOMY;  Surgeon: Margaretha Sheffield, MD;  Location: [place];  Service: ENT;  Laterality: Left;   gyn surgery     hysterectomy- done for adenomysosis and endometriosis   IMAGE GUIDED SINUS SURGERY N/A 03/02/2020   Procedure: IMAGE GUIDED SINUS SURGERY;  Surgeon: Margaretha Sheffield, MD;  Location:  [place];  Service: ENT;  Laterality: N/A;  need disk PLACED DISK ON OR CHARGE NURSE DESK 9-17 KP   IMAGE GUIDED SINUS SURGERY Left 04/13/2020   Procedure: IMAGE GUIDED SINUS SURGERY;  Surgeon: Margaretha Sheffield, MD;  Location: [place];  Service: ENT;  Laterality: Left;  needs stryker disk per Justice Rocher use same disk as before (October) put on charge nurses desk. DS 04/03/20   MAXILLARY ANTROSTOMY Left 03/02/2020   Procedure: MAXILLARY ANTROSTOMY;  Surgeon: Margaretha Sheffield, MD;  Location: [place];  Service: ENT;  Laterality: Left;   MAXILLARY ANTROSTOMY Left 04/13/2020   Procedure: MAXILLECTOMY;  Surgeon: Margaretha Sheffield, MD;  Location: [place];  Service: ENT;  Laterality: Left;   OOPHORECTOMY     unilateral, due to ruptured ovarian cysts   OSTEOTOMY MAXILLARY     due to bite problems   PERIPHERAL VASCULAR INTERVENTION  03/28/2021   Procedure: PERIPHERAL VASCULAR INTERVENTION;  Surgeon: Wellington Hampshire, MD;  Location: [place] [facility];  Service: Cardiovascular;;   RENAL ANGIOGRAPHY N/A 03/28/2021   Procedure: RENAL ANGIOGRAPHY;  Surgeon: Wellington Hampshire, MD;  Location: [place] [facility];  Service: Cardiovascular;  Laterality: N/A;   TUBAL LIGATION     Social History:  reports that she has never smoked. She has never used smokeless tobacco. She reports current alcohol use. She reports that she does not use drugs.  Allergies  Allergen Reactions   Hctz [Hydrochlorothiazide] Other (See Comments)    Decreased gfr   Prozac [Fluoxetine Hcl] Palpitations   Family History  Problem Relation Age of Onset   Diabetes Mother    Aortic stenosis Mother    Cancer Father        leukemia   Retinoblastoma Son    Melanoma Son    Cancer Maternal Aunt        breast   Breast cancer Maternal Aunt 63   Cancer Grandchild 1       retinoblastoma   Cancer Granddaughter    Family history: Family history reviewed and not pertinent.  Prior to Admission medications    Medication Sig Start Date End Date Taking? Authorizing Provider  ALPRAZolam (XANAX) 0.25 MG tablet Take 1 tablet (0.25 mg total) by mouth 2 (two) times daily as needed. 10/01/21  Yes Crecencio Mc, MD  aspirin EC 81 MG tablet Take 1 tablet (81 mg total) by mouth daily. Swallow whole. 03/23/21  Yes Wellington Hampshire, MD  carvedilol (COREG) 25 MG tablet Take one tablet  by mouth 2 (two) times daily with a meal. 10/26/21 10/26/22 Yes Dutch Quint B, FNP  estradiol (ESTRACE) 0.1 MG/GM vaginal cream INSERT 1/4 APPLICATORFUL VAGINALLY TWO TIMES A WEEK AS DIRECTED 12/28/21 12/28/22 Yes Crecencio Mc, MD  fluticasone (FLONASE) 50 MCG/ACT  nasal spray PLACE 2 SPRAYS INTO BOTH NOSTRILS DAILY. 12/28/21 12/28/22 Yes Crecencio Mc, MD  hydrALAZINE (APRESOLINE) 25 MG tablet Take 1 tablet (25 mg total) by mouth 3 (three) times daily. Patient taking differently: Take 25 mg by mouth 3 (three) times daily as needed (for high BP). 01/03/21  Yes Gollan, Kathlene November, MD  HYDROcodone-acetaminophen (NORCO) 10-325 MG tablet Take 1 tablet by mouth every 6 (six) hours as needed for severe pain. 09/21/21 03/20/22 Yes Crecencio Mc, MD  metFORMIN (GLUCOPHAGE-XR) 750 MG 24 hr tablet Take 1 tablet by mouth daily with breakfast. 12/21/21 12/21/22 Yes Crecencio Mc, MD  rosuvastatin (CRESTOR) 5 MG tablet Take 1 tablet (5 mg total) by mouth at bedtime. 01/02/22  Yes Gollan, Kathlene November, MD  sodium fluoride (SF 5000 PLUS) 1.1 % CREA dental cream Brush teeth thoroughly for 2 minutes, do not rinse afterwards. 04/03/21  Yes   telmisartan (MICARDIS) 80 MG tablet Take 1 tablet (80 mg total) by mouth daily. 10/18/21  Yes Crecencio Mc, MD  traMADol (ULTRAM) 50 MG tablet TAKE 2 TABLETS (100 MG TOTAL) BY MOUTH EVERY 6 (SIX) HOURS AS NEEDED. 01/09/22  Yes Crecencio Mc, MD  amLODipine (NORVASC) 5 MG tablet Take 1 tablet (5 mg total) by mouth daily. 09/21/21   Crecencio Mc, MD  Menthol, Topical Analgesic, (BIOFREEZE EX) Apply 1 application  topically at bedtime. Applied to neck area    [provider]  Semaglutide, 1 MG/DOSE, 4 MG/3ML SOPN Inject 1 mg as directed once a week. 10/25/21   Crecencio Mc, MD  spironolactone (ALDACTONE) 25 MG tablet Take 1 tablet (25 mg total) by mouth daily. 04/19/20 05/22/20  Wellington Hampshire, MD   Physical Exam: Vitals:   01/17/22 1641 01/17/22 1700 01/17/22 1730 01/17/22 1906  BP: (!) 186/112 (!) 215/121 (!) 190/100 (!) 142/96  Pulse: 75 79 73 77  Resp: 11 12 (!) 8 13  Temp: 98.1 F (36.7 C)     TempSrc: Oral     SpO2: 99% 100% 100% 100%   Constitutional: appears age-appropriate, NAD, calm, comfortable Eyes: PERRL, lids and conjunctivae normal ENMT: Mucous membranes are moist. Posterior pharynx clear of any exudate or lesions. Age-appropriate dentition. Hearing appropriate Neck: normal, supple, no masses, no thyromegaly Respiratory: clear to auscultation bilaterally, no wheezing, no crackles. Normal respiratory effort. No accessory muscle use.  Cardiovascular: Regular rate and rhythm, + murmurs. No extremity edema. 2+ pedal pulses. No carotid bruits.  Abdomen: no tenderness, no masses palpated, no hepatosplenomegaly. Bowel sounds positive.  Musculoskeletal: no clubbing / cyanosis. No joint deformity upper and lower extremities. Good ROM, no contractures, no atrophy. Normal muscle tone.  Skin: no rashes, lesions, ulcers. No induration Neurologic: Sensation intact. Strength 5/5 in all 4.  Psychiatric: Normal judgment and insight. Alert and oriented x 3. Normal mood.   EKG: independently reviewed, showing sinus rhythm with rate of 86, QTc 435 Chest x-ray on Admission: ordered Labs on Admission: I have personally reviewed following labs  CBC: Recent Labs  Lab 01/17/22 1532  WBC 9.1  HGB 13.1  HCT 40.9  MCV 85.4  PLT 675   Basic Metabolic Panel: Recent Labs  Lab 01/17/22 1545  NA 138  K 3.9  CL 100  CO2 26  GLUCOSE 109*  BUN 26*  CREATININE 1.12*  CALCIUM 9.9    GFR: CrCl cannot be calculated (Unknown ideal weight.).  Urine analysis:    Component Value Date/Time   COLORURINE STRAW (A) 01/17/2022 1532  APPEARANCEUR CLEAR (A) 01/17/2022 1532   LABSPEC 1.009 01/17/2022 1532   PHURINE 6.0 01/17/2022 1532   GLUCOSEU NEGATIVE 01/17/2022 1532   HGBUR SMALL (A) 01/17/2022 1532   BILIRUBINUR NEGATIVE 01/17/2022 1532   KETONESUR NEGATIVE 01/17/2022 1532   PROTEINUR NEGATIVE 01/17/2022 1532   NITRITE NEGATIVE 01/17/2022 [place] 01/17/2022 1532   Dr. Tobie Poet [facility]  If 7PM-7AM, please contact overnight-coverage provider If 7AM-7PM, please contact day coverage provider [email or web address]  01/17/2022, 7:36 PM

## 2022-01-17 NOTE — Assessment & Plan Note (Signed)
-   Status post stent placement on November 2022 and recent renal duplex in March 2023 showing patent stents

## 2022-01-17 NOTE — Hospital Course (Signed)
[name] is a 61 year old female with history of left artery stenosis status post stent placement in November 2022, bicuspid aortic valve, hyperlipidemia, non-insulin-dependent diabetes mellitus, heart failure preserved ejection fraction, moderate aortic valve stenosis, who presents emergency department from home for chief concerns of near syncopal episode.  Initial vitals in the emergency department showed temperature 98.1, respiration rate of 8, heart rate of 79, blood pressure 212/122, SPO2 100% on room air.  Serum sodium is 138, potassium 3.9, chloride 100, bicarb 26, BUN of 26, serum creatinine 1.12, eGFR 55, nonfasting blood glucose 109, WBC 9.1, hemoglobin 13.1, platelets of 297.  High sensitive troponin was 6.  UA was negative for leukocytes and nitrates.  ED treatment: Carvedilol 25 mg p.o. one-time dose, labetalol 100 mg IV.

## 2022-01-17 NOTE — ED Notes (Signed)
DO at bedside 

## 2022-01-17 NOTE — ED Triage Notes (Signed)
Pt presents to ED with c/o of feeling weak and having multiple near syncope episodes, pt states this has been ongoing for a few weeks.   Pt states she has seen cardio and wore a heart monitor, pt states it fell off today.   Pt states she has been having episodes of heavy diaphoresis accompanied by nausea.  Pt is hypertensive in the 200's after taking BP meds, pt states she took a BP med PTA.

## 2022-01-17 NOTE — Assessment & Plan Note (Addendum)
-   Outpatient PCP has been holding carvedilol due to concerns for hypotension causing dizziness/near syncope at home - Status post carvedilol 25 mg p.o. one-time dose in the ED - I have resumed carvedilol 25 mg p.o. twice daily for 01/18/2022 - Telmisartan 80 mg equivalent (irbesartan 300 mg daily) resumed for 01/18/22 - Hydralazine 25 mg p.o. 3 times daily as needed for elevated SBP has been held on admission - I have ordered hydralazine 5 mg IV every 6 hours as needed for SBP greater than 180, 2 days ordered - Patient requesting cardiology consultation tomorrow with Dr. Buzzy Han - Admit to progressive cardiac, observation

## 2022-01-17 NOTE — Assessment & Plan Note (Addendum)
-   Non-insulin-dependent - Rosuvastatin 5 mg nightly resume - Metformin 750 mg p.o. daily before breakfast resumed - Home semaglutide injection has not been resumed while inpatient - Insulin SSI with at bedtime coverage ordered - CBG check 3 times daily AC at bedtime ordered - Goal inpatient blood glucose levels 140-180

## 2022-01-17 NOTE — Telephone Encounter (Signed)
Patient contacted the office stating that she is having trouble standing without the feeling of passing out. She was inquiring about moving her appointment with Dr. Laneta Simmers up that is scheduled for next Wednesday, 9/13. Advised that we are unable to move up her appt but that if she was increasingly experiencing the feeling of passing out, she needed to go to the ED for evaluation. She acknowledged receipt.

## 2022-01-17 NOTE — ED Notes (Signed)
Pt states that over the past few weeks she has been having pre-syncopal episodes. Pt states that she recently saw cardiology and was wearing a monitor. Pt states that the monitor came off today due to a sweating episode. Pt states that symptoms get worse with any kind of activity. Pt states that she gets short of breath, diaphoretic, and sick to her stomach. Pt reports that when this happens she lays in the floor with her feet up and her symptoms start to resolve after about 10 minutes.

## 2022-01-17 NOTE — Assessment & Plan Note (Signed)
-   Resumed home carvedilol 25 mg p.o. twice daily, irbesartan 300 mg daily

## 2022-01-17 NOTE — Assessment & Plan Note (Addendum)
With dizziness while sitting With increased frequency, multiple episodes on day of admission Blurry vision - Neurology has been consulted via staff message to Dr. Wilford Corner who recommends MRI of the brain without contrast and EEG - If MRI and/or EEG is abnormal, a.m. team to reconsult neurology - Admit to PCU - Fall precautions

## 2022-01-17 NOTE — ED Provider Notes (Signed)
[facility] Provider Note    Event Date/Time   First MD Initiated Contact with Patient 01/17/22 1543     (approximate)   History   Chief Complaint Weakness and Near Syncope   HPI  Courtney Lopez is a 61 y.o. female with past medical history of hypertension, hyperlipidemia, diabetes, aortic stenosis, thoracic aortic aneurysm, and renal artery stenosis status post stent who presents to the ED complaining of near syncope.  Patient reports that she has been dealing with dizziness and near syncopal episodes for about the past 3 months intermittently.  They have progressively become more frequent and severe, now occurring on a daily basis and limiting her ability to work.  She states that she has palpitations and difficulty breathing with these episodes along with some diaphoresis.  She denies any pain in her chest, has not had any fevers or cough.  She has been following with cardiology for this issue and recently had a Zio patch placed, but has not had results yet.  She was additionally seen by her PCP for this problem, who thought maybe that her blood pressure was dropping and recommended she hold her Coreg.  She continues to take Micardis as prescribed for her blood pressure.     Physical Exam   Triage Vital Signs: ED Triage Vitals [01/17/22 1545]  Enc Vitals Group     BP (!) 212/122     Pulse Rate 79     Resp (!) 8     Temp      Temp src      SpO2 100 %     Weight      Height      Head Circumference      Peak Flow      Pain Score      Pain Loc      Pain Edu?      Excl. in GC?     Most recent vital signs: Vitals:   01/17/22 1700 01/17/22 1730  BP: (!) 215/121 (!) 190/100  Pulse: 79 73  Resp: 12 (!) 8  Temp:    SpO2: 100% 100%    Constitutional: Alert and oriented. Eyes: Conjunctivae are normal. Head: Atraumatic. Nose: No congestion/rhinnorhea. Mouth/Throat: Mucous membranes are moist.  Cardiovascular: Normal rate, regular rhythm.   Systolic murmur noted.  2+ radial pulses bilaterally. Respiratory: Normal respiratory effort.  No retractions. Lungs CTAB. Gastrointestinal: Soft and nontender. No distention. Musculoskeletal: No lower extremity tenderness nor edema.  Neurologic:  Normal speech and language. No gross focal neurologic deficits are appreciated.    ED Results / Procedures / Treatments   Labs (all labs ordered are listed, but only abnormal results are displayed) Labs Reviewed  BASIC METABOLIC PANEL - Abnormal; Notable for the following components:      Result Value   Glucose, Bld 109 (*)    BUN 26 (*)    Creatinine, Ser 1.12 (*)    GFR, Estimated 55 (*)    All other components within normal limits  URINALYSIS, ROUTINE W REFLEX MICROSCOPIC - Abnormal; Notable for the following components:   Color, Urine STRAW (*)    APPearance CLEAR (*)    Hgb urine dipstick SMALL (*)    Bacteria, UA RARE (*)    All other components within normal limits  CBC  CBG MONITORING, ED  TROPONIN I (HIGH SENSITIVITY)     EKG  ED ECG REPORT I, Chesley Noon, the attending physician, personally viewed and interpreted this ECG.  Date: 01/17/2022  EKG Time: 15:25  Rate: 86  Rhythm: normal sinus rhythm  Axis: LAD  Intervals:none  ST&T Change: None  PROCEDURES:  Critical Care performed: No  Procedures   MEDICATIONS ORDERED IN ED: Medications  carvedilol (COREG) tablet 25 mg (25 mg Oral Given 01/17/22 1742)  labetalol (NORMODYNE) injection 10 mg (10 mg Intravenous Given 01/17/22 1742)     IMPRESSION / MDM / ASSESSMENT AND PLAN / ED COURSE  I reviewed the triage vital signs and the nursing notes.                              61 y.o. female with past medical history of hypertension, hyperlipidemia, diabetes, aortic stenosis, thoracic aortic aneurysm, and renal artery stenosis status post stent who presents to the ED for intermittent dizziness and near syncopal episodes for the past couple of months, now  increasing in frequency and severity.  Patient's presentation is most consistent with acute presentation with potential threat to life or bodily function.  Differential diagnosis includes, but is not limited to, ACS, arrhythmia, electrolyte abnormality, anemia, aortic stenosis, vasovagal episode, orthostatic hypotension.  Patient nontoxic-appearing and in no acute distress, vital signs remarkable for markedly elevated blood pressure but otherwise reassuring.  EKG shows no evidence of arrhythmia or ischemia, unfortunately I am unable to interrogate her Zio patch as able need to be sent back to manufacturer for results.  Labs are reassuring thus far with no significant anemia, leukocytosis, electrolyte abnormality, or AKI.  We will observe on cardiac monitor and add on heart enzymes.  Patient currently having daily episodes of near syncope limiting her ability to work and she would benefit from observation to ensure no arrhythmia contributing.  She does have appointment scheduled with cardiothoracic surgery next week to discuss her valvular disease potentially contributing.  Troponin within normal limits, no events noted on cardiac monitor.  Blood pressure gradually improving following IV labetalol and Coreg.  Case discussed with hospitalist for admission for hypertensive urgency and near syncope.      FINAL CLINICAL IMPRESSION(S) / ED DIAGNOSES   Final diagnoses:  Near syncope  Hypertensive urgency     Rx / DC Orders   ED Discharge Orders     None        Note:  This document was prepared using Dragon voice recognition software and may include unintentional dictation errors.   Chesley Noon, MD 01/17/22 [phone]

## 2022-01-17 NOTE — Assessment & Plan Note (Signed)
-   COVID admission test ordered

## 2022-01-18 ENCOUNTER — Observation Stay: Payer: 59

## 2022-01-18 ENCOUNTER — Telehealth: Payer: Self-pay

## 2022-01-18 DIAGNOSIS — E1169 Type 2 diabetes mellitus with other specified complication: Secondary | ICD-10-CM | POA: Diagnosis not present

## 2022-01-18 DIAGNOSIS — R55 Syncope and collapse: Secondary | ICD-10-CM | POA: Diagnosis not present

## 2022-01-18 DIAGNOSIS — I110 Hypertensive heart disease with heart failure: Secondary | ICD-10-CM | POA: Diagnosis not present

## 2022-01-18 DIAGNOSIS — I6523 Occlusion and stenosis of bilateral carotid arteries: Secondary | ICD-10-CM | POA: Diagnosis not present

## 2022-01-18 DIAGNOSIS — I509 Heart failure, unspecified: Secondary | ICD-10-CM | POA: Diagnosis not present

## 2022-01-18 DIAGNOSIS — I7121 Aneurysm of the ascending aorta, without rupture: Secondary | ICD-10-CM | POA: Diagnosis not present

## 2022-01-18 DIAGNOSIS — Q231 Congenital insufficiency of aortic valve: Secondary | ICD-10-CM | POA: Diagnosis not present

## 2022-01-18 DIAGNOSIS — I951 Orthostatic hypotension: Secondary | ICD-10-CM | POA: Diagnosis not present

## 2022-01-18 DIAGNOSIS — I160 Hypertensive urgency: Secondary | ICD-10-CM | POA: Diagnosis not present

## 2022-01-18 DIAGNOSIS — E782 Mixed hyperlipidemia: Secondary | ICD-10-CM | POA: Diagnosis not present

## 2022-01-18 DIAGNOSIS — Z952 Presence of prosthetic heart valve: Secondary | ICD-10-CM | POA: Diagnosis not present

## 2022-01-18 DIAGNOSIS — Z7984 Long term (current) use of oral hypoglycemic drugs: Secondary | ICD-10-CM | POA: Diagnosis not present

## 2022-01-18 DIAGNOSIS — Z20822 Contact with and (suspected) exposure to covid-19: Secondary | ICD-10-CM | POA: Diagnosis not present

## 2022-01-18 DIAGNOSIS — I77810 Thoracic aortic ectasia: Secondary | ICD-10-CM | POA: Diagnosis not present

## 2022-01-18 DIAGNOSIS — I771 Stricture of artery: Secondary | ICD-10-CM | POA: Diagnosis not present

## 2022-01-18 LAB — CBC
HCT: 37 % (ref 36.0–46.0)
Hemoglobin: 12.4 g/dL (ref 12.0–15.0)
MCH: 27.7 pg (ref 26.0–34.0)
MCHC: 33.5 g/dL (ref 30.0–36.0)
MCV: 82.8 fL (ref 80.0–100.0)
Platelets: 228 10*3/uL (ref 150–400)
RBC: 4.47 MIL/uL (ref 3.87–5.11)
RDW: 12.3 % (ref 11.5–15.5)
WBC: 8.9 10*3/uL (ref 4.0–10.5)
nRBC: 0 % (ref 0.0–0.2)

## 2022-01-18 LAB — BASIC METABOLIC PANEL
Anion gap: 4 — ABNORMAL LOW (ref 5–15)
Anion gap: 9 (ref 5–15)
BUN: 27 mg/dL — ABNORMAL HIGH (ref 8–23)
BUN: 23 mg/dL (ref 8–23)
CO2: 29 mmol/L (ref 22–32)
CO2: 26 mmol/L (ref 22–32)
Calcium: 9.2 mg/dL (ref 8.9–10.3)
Calcium: 9 mg/dL (ref 8.9–10.3)
Chloride: 105 mmol/L (ref 98–111)
Chloride: 102 mmol/L (ref 98–111)
Creatinine, Ser: 1.58 mg/dL — ABNORMAL HIGH (ref 0.44–1.00)
Creatinine, Ser: 1.41 mg/dL — ABNORMAL HIGH (ref 0.44–1.00)
GFR, Estimated: 37 mL/min — ABNORMAL LOW (ref >60)
GFR, Estimated: 42 mL/min — ABNORMAL LOW (ref >60)
Glucose, Bld: 91 mg/dL (ref 70–99)
Glucose, Bld: 90 mg/dL (ref 70–99)
Potassium: 3.2 mmol/L — ABNORMAL LOW (ref 3.5–5.1)
Potassium: 5 mmol/L (ref 3.5–5.1)
Sodium: 138 mmol/L (ref 135–145)
Sodium: 137 mmol/L (ref 135–145)

## 2022-01-18 LAB — HIV ANTIBODY (ROUTINE TESTING W REFLEX): HIV Screen 4th Generation wRfx: NONREACTIVE

## 2022-01-18 LAB — GLUCOSE, CAPILLARY
Glucose-Capillary: 71 mg/dL (ref 70–99)
Glucose-Capillary: 88 mg/dL (ref 70–99)

## 2022-01-18 MED ORDER — ORAL CARE MOUTH RINSE: 15.0000 mL | OROMUCOSAL | Status: DC | PRN

## 2022-01-18 MED ORDER — POTASSIUM CHLORIDE CRYS ER 20 MEQ PO TBCR
40.0000 meq | EXTENDED_RELEASE_TABLET | Freq: Once | ORAL | Status: AC
  Administered 2022-01-18: 40 meq via ORAL
  Filled 2022-01-18: qty 2

## 2022-01-18 NOTE — H&P (View-Only) (Signed)
Cardiology Consultation   Patient ID: Courtney [facility] MRN: [id number]; DOB: 03-01-1961  Admit date: 01/17/2022 Date of Consult: 01/18/2022  PCP:  Courtney Mc, MD   [facility] Cardiologist:  Courtney Rogue, MD        Patient Profile:   Courtney [facility] is a 61 y.o. female with a hx of hypertension, hyperlipidemia, diabetes, aortic stenosis, thoracic aortic aneurysm, and renal artery stenosis who is being seen 01/18/2022 for the evaluation of near syncope at the request of Dr. Tobie Poet.  History of Present Illness:   Courtney [facility] is a 61 year old female with a complex past medical history of hypertension, hyperlipidemia, renal artery stenosis, bicuspid aortic valve with moderate to severe aortic stenosis, ascending aortic aneurysm, hyperlipidemia, and type two diabetes.   She had a CT of the aorta in 2020 which showed evidence of significant left renal artery stenosis with left significant calcifications.  It was suggestive of fibromuscular dysplasia.  Left renal artery stenosis was confirmed with renal artery duplex.  Repeat renal artery duplex in 2021 showed significant stenosis in the mid left renal artery with peak velocity of 439.  Carotid Doppler showed no significant disease.  Blood pressures were initially controlled with medications with stable renal function and thus renal artery angiography was not pursued.  However she had worsening renal function and blood pressure control in late of 2022.  She had undergone angiography in November 2022 which showed 95% stenosis of the proximal left renal artery with no significant disease affecting the right renal artery.  Successful stent placement was done of the left renal artery.  She had repeat renal artery duplex in March which revealed patent left renal artery stent.  She did have recent echocardiogram which was completed that showed LVEF 60-65%.  No regional wall motion abnormalities, moderate asymmetric left  ventricular hypertrophy of the basal septal segment, G1 DD, mild mitral regurgitation, the aortic valve has moderate calcification, aortic valve regurgitation is not visualized, moderate aortic valve stenosis by mean gradient measures 23.2 mmHg, aortic valve V-max measures 3.57M/S, severe stenosis by aortic valve area by VTI measures 0.68 cm, there is borderline dilatation of the aortic root measuring 37 mm, moderate dilatation of the ascending aorta measuring 45 mm.   She was last seen in clinic on 01/02/2022 with complaints of increasing palpitations, dizziness and presyncope that was associated with nausea and visual changes.  Blood pressure had been better controlled she was scheduled for echocardiogram and placed on a 14-day ZIO monitor.  She was followed by her PCP as well who had advised her to hold her carvedilol with thoughts that her dizziness was orthostasis.  She presented to the emergency department at [facility]: [facility] 01/17/2022 with complaints of weakness and near syncope.  She stated that she had been dealing with dizziness and near syncopal episodes for 3 months intermittently.  They have progressively come more frequent and severe, now occurring on a daily basis and limits her ability to work.  She states has palpitations with shortness of breath with these episodes along with diaphoresis.  She denies any chest pains, or chest pressure, but continues to endorse dizziness, lightheadedness, headache, and visual disturbances.  Initial vitals in the emergency department: Blood pressure 212/122, pulse 79, respirations of 12, oxygen saturation of 100%  Pertinent labs: Glucose 109, BUN 26, creatinine 1.12, GFR 55, Respiratory panel is negative, high-sensitivity troponin of 6  Imaging: Chest x-ray revealed heart size and mediastinal contours within normal limits, lungs are  clear, visualized skeletal structures were unremarkable with the impression of no active disease; MRI of the brain revealed no acute  intercranial process no etiology seen for the patient's near syncopal episodes, no evidence of acute or subacute infarct  Medications administered in the emergency department: Carvedilol 25 mg, labetalol 10 mg IVP  Past Medical History:  Diagnosis Date   Ankle fracture 12/31/2018   left   Anxiety    Arthritis    neck, back, ankle, knees   Ascending aortic aneurysm ([place])    a. 04/2016 CTA: 4.6cm Asc TAA; b. 04/2018 CTA: 4.8cm; c. 10/2018 CTA: 4.9x4.8cm; d. 12/2019 Echo: 4.8 cm.   Bicuspid aortic valve    Breast screening, unspecified    Carotid arterial disease ([place])    a. 01/2020 U/S: 1-39% bilat ICA stenosis. No evidence of FMD.   Cervicalgia    cervical stenosis   Diabetes mellitus without complication (HCC)    Elevated coronary artery calcium score    a. 03/2016 Cardiac CT: Ca2+ = 78 (90th %'ile).   Fibromuscular dysplasia (HCC)    Heart murmur    History of hiatal hernia    Metatarsal fracture 2007   5th, from wearing high heels   Moderate aortic stenosis    a. 12/2019 Echo: EF 60-65%, no rwma, mild LVH, gr1 DD, nl RV size/fxn, triv MR, mild AI, mod AS, mod-sev dil of Asc Ao - 106mm.  AoV reported as tricuspid but prev known to be bicuspid.   Multiple thyroid nodules    a. 10/2019 U/S: R mid thyroid nodule - f/u 5 yrs.   Other symptoms involving abdomen and pelvis(789.9)    Renal artery stenosis ([place])    a. 12/2019 Renal duplex: Nl RRA. LRA >60% stenosis.   Renal cyst, right    a. 12/2019 Duplex: 5.8 x 5.7 cm R renal cyst in lower pole.   Retinal hemorrhage    Symptomatic menopausal or female climacteric states    Unspecified essential hypertension    Wears contact lenses     Past Surgical History:  Procedure Laterality Date   ABDOMINAL HYSTERECTOMY     ANKLE ARTHROSCOPY WITH RECONSTRUCTION Left 08/24/2020   Procedure: LEFT ANKLE ARTHROSCOPIC DEBRIDEMENT, EXTENSIVE, ARTHOSCROPIC TREATMENT OF TALUS OSTEOCHONDRAL LESION, LATERAL LIGAMENT RECONSTRUCTION;  Surgeon: Erle Crocker, MD;  Location: [facility];  Service: Orthopedics;  Laterality: Left;   APPENDECTOMY     done during oophorectomy   CESAREAN SECTION     x 2   ETHMOIDECTOMY Left 03/02/2020   Procedure: ETHMOIDECTOMY;  Surgeon: Margaretha Sheffield, MD;  Location: [facility];  Service: ENT;  Laterality: Left;   gyn surgery     hysterectomy- done for adenomysosis and endometriosis   IMAGE GUIDED SINUS SURGERY N/A 03/02/2020   Procedure: IMAGE GUIDED SINUS SURGERY;  Surgeon: Margaretha Sheffield, MD;  Location: [place] [facility];  Service: ENT;  Laterality: N/A;  need disk PLACED DISK ON OR CHARGE NURSE DESK 9-17 KP   IMAGE GUIDED SINUS SURGERY Left 04/13/2020   Procedure: IMAGE GUIDED SINUS SURGERY;  Surgeon: Margaretha Sheffield, MD;  Location[facility];  Service: ENT;  Laterality: Left;  needs stryker disk per Justice Rocher use same disk as before (October) put on charge nurses desk. DS 04/03/20   MAXILLARY ANTROSTOMY Left 03/02/2020   Procedure: MAXILLARY ANTROSTOMY;  Surgeon: Margaretha Sheffield, MD;  Location: [facility];  Service: ENT;  Laterality: Left;   MAXILLARY ANTROSTOMY Left 04/13/2020   Procedure: MAXILLECTOMY;  Surgeon: Margaretha Sheffield, MD;  Location: [facility];  Service: ENT;  Laterality: Left;   OOPHORECTOMY     unilateral, due to ruptured ovarian cysts   OSTEOTOMY MAXILLARY     due to bite problems   PERIPHERAL VASCULAR INTERVENTION  03/28/2021   Procedure: PERIPHERAL VASCULAR INTERVENTION;  Surgeon: Arida, Muhammad A, MD;  Location: [facility];  Service: Cardiovascular;;   RENAL ANGIOGRAPHY N/A 03/28/2021   Procedure: RENAL ANGIOGRAPHY;  Surgeon: Arida, Muhammad A, MD;  Location: [facility];  Service: Cardiovascular;  Laterality: N/A;   TUBAL LIGATION       Home Medications:  Prior to Admission medications   Medication Sig Start Date End Date Taking? Authorizing Provider  ALPRAZolam (XANAX) 0.25 MG tablet Take 1 tablet (0.25 mg total)  by mouth 2 (two) times daily as needed. 10/01/21  Yes Tullo, Teresa L, MD  aspirin EC 81 MG tablet Take 1 tablet (81 mg total) by mouth daily. Swallow whole. 03/23/21  Yes Arida, Muhammad A, MD  carvedilol (COREG) 25 MG tablet Take one tablet  by mouth 2 (two) times daily with a meal. 10/26/21 10/26/22 Yes Webb, Padonda B, FNP  estradiol (ESTRACE) 0.1 MG/GM vaginal cream INSERT 1/4 APPLICATORFUL VAGINALLY TWO TIMES A WEEK AS DIRECTED 12/28/21 12/28/22 Yes Tullo, Teresa L, MD  fluticasone (FLONASE) 50 MCG/ACT nasal spray PLACE 2 SPRAYS INTO BOTH NOSTRILS DAILY. 12/28/21 12/28/22 Yes Tullo, Teresa L, MD  hydrALAZINE (APRESOLINE) 25 MG tablet Take 1 tablet (25 mg total) by mouth 3 (three) times daily. Patient taking differently: Take 25 mg by mouth 3 (three) times daily as needed (for high BP). 01/03/21  Yes Gollan, Timothy J, MD  HYDROcodone-acetaminophen (NORCO) 10-325 MG tablet Take 1 tablet by mouth every 6 (six) hours as needed for severe pain. 09/21/21 03/20/22 Yes Tullo, Teresa L, MD  metFORMIN (GLUCOPHAGE-XR) 750 MG 24 hr tablet Take 1 tablet by mouth daily with breakfast. 12/21/21 12/21/22 Yes Tullo, Teresa L, MD  rosuvastatin (CRESTOR) 5 MG tablet Take 1 tablet (5 mg total) by mouth at bedtime. 01/02/22  Yes Gollan, Timothy J, MD  sodium fluoride (SF 5000 PLUS) 1.1 % CREA dental cream Brush teeth thoroughly for 2 minutes, do not rinse afterwards. 04/03/21  Yes   telmisartan (MICARDIS) 80 MG tablet Take 1 tablet (80 mg total) by mouth daily. 10/18/21  Yes Tullo, Teresa L, MD  traMADol (ULTRAM) 50 MG tablet TAKE 2 TABLETS (100 MG TOTAL) BY MOUTH EVERY 6 (SIX) HOURS AS NEEDED. 01/09/22  Yes Tullo, Teresa L, MD  amLODipine (NORVASC) 5 MG tablet Take 1 tablet (5 mg total) by mouth daily. 09/21/21   Tullo, Teresa L, MD  Menthol, Topical Analgesic, (BIOFREEZE EX) Apply 1 application topically at bedtime. Applied to neck area    [provider]  Semaglutide, 1 MG/DOSE, 4 MG/3ML SOPN Inject 1 mg as directed  once a week. 10/25/21   Tullo, Teresa L, MD  spironolactone (ALDACTONE) 25 MG tablet Take 1 tablet (25 mg total) by mouth daily. 04/19/20 05/22/20  Arida, Muhammad A, MD    Inpatient Medications: Scheduled Meds:  aspirin EC  81 mg Oral Daily   carvedilol  25 mg Oral BID WC   enoxaparin (LOVENOX) injection  40 mg Subcutaneous QHS   irbesartan  300 mg Oral Daily   potassium chloride  40 mEq Oral Once   rosuvastatin  5 mg Oral QHS   Continuous Infusions:  PRN Meds: acetaminophen **OR** acetaminophen, ALPRAZolam, hydrALAZINE, ondansetron **OR** ondansetron (ZOFRAN) IV, mouth rinse, senna-docusate,   traMADol  Allergies:    Allergies  Allergen Reactions   Hctz [Hydrochlorothiazide] Other (See Comments)    Decreased gfr   Prozac [Fluoxetine Hcl] Palpitations    Social History:   Social History   Socioeconomic History   Marital status: Married    Spouse name: Not on file   Number of children: 3   Years of education: Not on file   Highest education level: Not on file  Occupational History   Not on file  Tobacco Use   Smoking status: Never   Smokeless tobacco: Never  Vaping Use   Vaping Use: Never used  Substance and Sexual Activity   Alcohol use: Yes    Comment: rare   Drug use: No   Sexual activity: Not on file  Other Topics Concern   Not on file  Social History Narrative   Lives with spouse.   Social Determinants of Health   Financial Resource Strain: Not on file  Food Insecurity: Not on file  Transportation Needs: Not on file  Physical Activity: Not on file  Stress: Not on file  Social Connections: Not on file  Intimate Partner Violence: Not on file    Family History:    Family History  Problem Relation Age of Onset   Diabetes Mother    Aortic stenosis Mother    Cancer Father        leukemia   Retinoblastoma Son    Melanoma Son    Cancer Maternal Aunt        breast   Breast cancer Maternal Aunt 101   Cancer Grandchild 1       retinoblastoma   Cancer  Granddaughter      ROS:  Please see the history of present illness.  Review of Systems  Constitutional:  Positive for malaise/fatigue.  Eyes:  Positive for double vision.  Musculoskeletal:  Positive for neck pain.  Neurological:  Positive for dizziness, weakness and headaches.    All other ROS reviewed and negative.     Physical Exam/Data:   Vitals:   01/18/22 0500 01/18/22 0600 01/18/22 0700 01/18/22 0738  BP:    (!) 157/98  Pulse:    81  Resp: 11 14 18 16   Temp:    97.9 F (36.6 C)  TempSrc:      SpO2:    100%  Weight:      Height:       No intake or output data in the 24 hours ending 01/18/22 0930    01/17/2022    8:05 PM 01/02/2022   10:42 AM 10/25/2021    8:37 AM  Last 3 Weights  Weight (lbs) 159 lb 13.3 oz 163 lb 3.2 oz 166 lb 6 oz  Weight (kg) 72.5 kg 74.027 kg 75.467 kg     Body mass index is 25.8 kg/m.  General:  Well nourished, well developed, in no acute distress HEENT: normal Neck: no JVD appreciated Vascular: No carotid bruits; Distal pulses 2+ bilaterally Cardiac:  normal S1, S2; RRR; III/VI systolic murmur without rubs or gallops Lungs:  clear to auscultation bilaterally, no wheezing, rhonchi or rales, respirations are unlabored on room air at rest Abd: soft, nontender, no hepatomegaly  Ext: no edema Musculoskeletal:  No deformities, BUE and BLE strength normal and equal Skin: warm and dry  Neuro:  CNs 2-12 intact, no focal abnormalities noted Psych:  Normal affect   EKG:  The EKG was personally reviewed and demonstrates:  sinus rate of 86 with LVH, and  LAD Telemetry:  Telemetry was personally reviewed and demonstrates:  sinus rate of 77  Relevant CV Studies: TTE 12/25/21 1. Left ventricular ejection fraction, by estimation, is 60 to 65%. The  left ventricle has normal function. The left ventricle has no regional  wall motion abnormalities. There is moderate asymmetric left ventricular  hypertrophy of the basal-septal  segment. Left ventricular  diastolic parameters are consistent with Grade I  diastolic dysfunction (impaired relaxation).   2. Right ventricular systolic function is normal. The right ventricular  size is normal.   3. The mitral valve is normal in structure. Mild mitral valve  regurgitation. No evidence of mitral stenosis.   4. The aortic valve has moderate calcification. Aortic valve  regurgitation is not visualized. Moderate aortic valve stenosis by mean  gradient measures 23.2 mmHg. Aortic valve Vmax measures 3.22 m/s. Severe  stenosis by aortic valve area, by VTI measures  0.68 cm sq   5. There is borderline dilatation of the aortic root, measuring 37 mm.  There is moderate dilatation of the ascending aorta, measuring 45 mm.   6. The inferior vena cava is normal in size with greater than 50%  respiratory variability, suggesting right atrial pressure of 3 mmHg.   Laboratory Data:  High Sensitivity Troponin:   Recent Labs  Lab 01/17/22 1532  TROPONINIHS 6     Chemistry Recent Labs  Lab 01/17/22 1545 01/18/22 0418  NA 138 138  K 3.9 3.2*  CL 100 105  CO2 26 29  GLUCOSE 109* 91  BUN 26* 27*  CREATININE 1.12* 1.58*  CALCIUM 9.9 9.2  GFRNONAA 55* 37*  ANIONGAP 12 4*    No results for input(s): "PROT", "ALBUMIN", "AST", "ALT", "ALKPHOS", "BILITOT" in the last 168 hours. Lipids No results for input(s): "CHOL", "TRIG", "HDL", "LABVLDL", "LDLCALC", "CHOLHDL" in the last 168 hours.  Hematology Recent Labs  Lab 01/17/22 1532 01/18/22 0418  WBC 9.1 8.9  RBC 4.79 4.47  HGB 13.1 12.4  HCT 40.9 37.0  MCV 85.4 82.8  MCH 27.3 27.7  MCHC 32.0 33.5  RDW 12.3 12.3  PLT 297 228   Thyroid No results for input(s): "TSH", "FREET4" in the last 168 hours.  BNPNo results for input(s): "BNP", "PROBNP" in the last 168 hours.  DDimer No results for input(s): "DDIMER" in the last 168 hours.   Radiology/Studies:  MR BRAIN WO CONTRAST  Result Date: 01/17/2022 CLINICAL DATA:  Near syncopal episode EXAM: MRI  HEAD WITHOUT CONTRAST TECHNIQUE: Multiplanar, multiecho pulse sequences of the brain and surrounding structures were obtained without intravenous contrast. COMPARISON:  10/21/2018 MRI head FINDINGS: Brain: No restricted diffusion to suggest acute or subacute infarct. No acute hemorrhage, mass, mass effect, or midline shift. No hemosiderin deposition to suggest remote hemorrhage. No hydrocephalus or extra-axial collection. Scattered T2 hyperintense signal in the periventricular white matter, likely the sequela of mild chronic small vessel ischemic disease. Normal pituitary and craniocervical junction. Vascular: Normal arterial flow voids. Skull and upper cervical spine: Normal marrow signal. Sinuses/Orbits: Possible postsurgical changes. Mild mucosal thickening in the left-greater-than-right maxillary sinus. The orbits are unremarkable. Other: Fluid in the left-greater-than-right mastoid air cells. IMPRESSION: No acute intracranial process. No etiology is seen for the patient's near syncopal episodes. No evidence of acute or subacute infarct. Electronically Signed   By: Wiliam Ke M.D.   On: 01/17/2022 23:41   DG Chest Port 1 View  Result Date: 01/17/2022 CLINICAL DATA:  Weakness near syncope EXAM: PORTABLE CHEST 1 VIEW COMPARISON:  09/21/2021 FINDINGS:  The heart size and mediastinal contours are within normal limits. Both lungs are clear. The visualized skeletal structures are unremarkable. IMPRESSION: No active disease. Electronically Signed   By: Donavan Foil M.D.   On: 01/17/2022 20:14     Assessment and Plan:   Hypertensive urgency -According to records outpatient PCP had advised patient to hold carvedilol due to concerns for hypertension causing dizziness, near syncope at home -On arrival to the emergency department blood pressure was over 200/100 -Patient stated that she had been changing her medication regimen around, had come home fallen asleep and missed a dose of her medication which  escalated her blood pressure -Blood pressure this morning 157/98 -Continue Coreg 25 mg twice daily, irbesartan 300 mg daily, as needed hydralazine IVP every 6 hours -Daily orthostatic vitals -Vitals per unit protocol  Near syncope -With continued dizziness while sitting with increased frequency multiple episodes and associated symptoms of blurry vision -MRI of the brain has been completed without contrast -EEG is ordered and pending -Neurology to follow -Patient has been recently wore an outpatient ZIO monitor awaiting results -Continue with fall precautions  Renal artery stenosis -Renal angiography with stent placement 03/2021 -Repeat renal artery ultrasound 07/2021 revealed patent left renal artery stent with no significant restenosis -Recommended study follow-up in 1 year  Bicuspid aortic valve with moderate/severe aortic stenosis -Recent echocardiogram done 12/25/2021 which revealed aortic valve with moderate calcification, moderate aortic valve stenosis by mean gradient measures 23.2 mmHg, aortic valve V-max measures 3.61M/S, severe stenosis by aortic valve area, VTI measures 0.68 cm -Patient already had follow-up made with Dr. Cyndia Bent 01/23/2021 to discuss potential surgery for valve replacement and aneurysm repair -With patient becoming more symptomatic with syncope and dizziness need for valve replacement is imminent -patient would benefit from [facility] to evaluate aortic valve gradient and rule out coronary artery disease   Hyperlipidemia -Last LDL of 57 on 09/21/2021 -Continue with rosuvastatin  Type II diabetes -Last A1c of 5.8 on 09/21/2021 -Management per IM  Hypokalemia -Potassium level 3.2 -Supplemental potassium given this morning -Monitor/trend/replete electrolytes as needed -BMP ordered for 3 PM -Daily BMP  AKI -Serum creatinine 1.58 -Baseline serum creatinine 1 -Serum creatinine 1.12 on admission -Patient encouraged to hydrate with fluids today -Monitor urine  output -Creatinine remains however low afternoon BMP consider gentle hydration -Avoid nephrotoxic agents were able  9.   Ascending aortic aneurysm -Measuring 4.8 cm on CT angio done 12/28/2021 -Recommendations for semiannual imaging follow-up -Has been stable over the last several years -Patient will undergo aneurysm repair during valve repair   Risk Assessment/Risk Scores:                For questions or updates, please contact [place] Please consult [email or web address] for contact info under    Signed, Blue Ruggerio, NP  01/18/2022 9:30 AM

## 2022-01-18 NOTE — Telephone Encounter (Signed)
-----   Message from Iran Ouch, MD sent at 01/18/2022  2:50 PM EDT ----- The patient is hospitalized and likely will be discharged home tomorrow.  Please schedule her for a right and left cardiac catheterization to be done on Monday, September 18 in anticipation of aortic valve replacement and aneurysm repair.

## 2022-01-18 NOTE — Progress Notes (Signed)
Pt ambulated to bathroom, gait steady. Denies dizziness when walking to bathroom but after done using bathroom started to feel dizzy and states needs to lay down. Dizziness resolved within 1 minute after laying down.

## 2022-01-18 NOTE — Progress Notes (Signed)
[facility]  - [place] [facility]   PATIENT NAME: Courtney Lopez    MR#:  [id number]  DATE OF BIRTH:  1960/08/03  SUBJECTIVE:  patient came in with several NER syncopal psychological sorts out home recently Solu-Medrol and blood pressure becoming diaphoretic and feeling dizzy and lightheaded. Patient was found to be orthostatic today. Denies any chest pain. She has been monitoring her blood pressure which has been fluctuating at home.   VITALS:  Blood pressure 107/77, pulse 81, temperature 97.9 F (36.6 C), resp. rate 14, height 5\' 6"  (1.676 m), weight 72.5 kg, SpO2 100 %.  PHYSICAL EXAMINATION:   GENERAL:  61 y.o.-year-old patient lying in the bed with no acute distress.  LUNGS: Normal breath sounds bilaterally, no wheezing, rales, rhonchi.  CARDIOVASCULAR: S1, S2 normal. No murmurs, rubs, or gallops.  ABDOMEN: Soft, nontender, nondistended. Bowel sounds present.  EXTREMITIES: No  edema b/l.    NEUROLOGIC: nonfocal  patient is alert and awake SKIN: No obvious rash, lesion, or ulcer.   LABORATORY PANEL:  CBC Recent Labs  Lab 01/18/22 0418  WBC 8.9  HGB 12.4  HCT 37.0  PLT 228    Chemistries  Recent Labs  Lab 01/18/22 0418  NA 138  K 3.2*  CL 105  CO2 29  GLUCOSE 91  BUN 27*  CREATININE 1.58*  CALCIUM 9.2   Cardiac Enzymes No results for input(s): "TROPONINI" in the last 168 hours. RADIOLOGY:  EEG adult  Result Date: 01/18/2022 03/20/2022, MD     01/18/2022  2:13 PM Patient Name: Courtney Lopez MRN: [name] Epile[facility] Attending: [id number] Referring Physician/Provider: Charlsie Quest, DO Date: 01/18/2022 Duration: 36.02 mins Patient history: 61 year old female with near syncope.  EEG to evaluate for seizure. Level of alertness: Awake AEDs during EEG study: Xanax Technical aspects: This EEG study was done with scalp electrodes positioned according to the 10-20 International system of electrode placement. Electrical activity was  reviewed with band pass filter of 1-70Hz , sensitivity of 7 uV/mm, display speed of 48mm/sec with a 60Hz  notched filter applied as appropriate. EEG data were recorded continuously and digitally stored.  Video monitoring was available and reviewed as appropriate. Description: The posterior dominant rhythm consists of 8-9 Hz activity of moderate voltage (25-35 uV) seen predominantly in posterior head regions, symmetric and reactive to eye opening and eye closing. Physiologic photic driving was seen during photic stimulation.  Hyperventilation was not performed.   IMPRESSION: This study is within normal limits. No seizures or epileptiform discharges were seen throughout the recording. A normal interictal EEG does not exclude nor support the diagnosis of epilepsy. 31m   MR BRAIN WO CONTRAST  Result Date: 01/17/2022 CLINICAL DATA:  Near syncopal episode EXAM: MRI HEAD WITHOUT CONTRAST TECHNIQUE: Multiplanar, multiecho pulse sequences of the brain and surrounding structures were obtained without intravenous contrast. COMPARISON:  10/21/2018 MRI head FINDINGS: Brain: No restricted diffusion to suggest acute or subacute infarct. No acute hemorrhage, mass, mass effect, or midline shift. No hemosiderin deposition to suggest remote hemorrhage. No hydrocephalus or extra-axial collection. Scattered T2 hyperintense signal in the periventricular white matter, likely the sequela of mild chronic small vessel ischemic disease. Normal pituitary and craniocervical junction. Vascular: Normal arterial flow voids. Skull and upper cervical spine: Normal marrow signal. Sinuses/Orbits: Possible postsurgical changes. Mild mucosal thickening in the left-greater-than-right maxillary sinus. The orbits are unremarkable. Other: Fluid in the left-greater-than-right mastoid air cells. IMPRESSION: No acute intracranial process. No etiology is seen for  the patient's near syncopal episodes. No evidence of acute or subacute infarct.  Electronically Signed   By: Wiliam Ke M.D.   On: 01/17/2022 23:41   DG Chest Port 1 View  Result Date: 01/17/2022 CLINICAL DATA:  Weakness near syncope EXAM: PORTABLE CHEST 1 VIEW COMPARISON:  09/21/2021 FINDINGS: The heart size and mediastinal contours are within normal limits. Both lungs are clear. The visualized skeletal structures are unremarkable. IMPRESSION: No active disease. Electronically Signed   By: Jasmine Pang M.D.   On: 01/17/2022 20:14    Assessment and Plan  Courtney Lopez is a 62 year old female with history of left renal artery stenosis status post stent placement in November 2022, bicuspid aortic valve, hyperlipidemia, non-insulin-dependent diabetes mellitus, heart failure preserved ejection fraction, moderate aortic valve stenosis, who presents emergency department from home for chief concerns of near syncopal episode.  Recurrent Near syncopal episodes in the setting of orthostatic hypotension with episodes of hypertensive urgency History of aortic stenosis moderate to severe with standing aortic aneurysm /bicuspid aortic valve -- continue current cardiac/blood pressure meds -- patient seen by Dr. Kirke Corin [facility] cardiology--- recommendation to keep appointment with cardiothoracic surgery Dr. Lavinia Sharps next week and consider repair of aortic aneurysm and aortic valve -- MRI brain negative --EEG neg --US carotid pending --recent 12/25/2021 complete echo: LV ejection fraction estimated at 60 to 65%, grade 1 diastolic dysfunction.  Moderate aortic valve stenosis.  Aortic valve has moderate calcification.  Hyperlipidemia -- continue statins  type II diabetes, well-controlled -- A1c 5.8 -- will continue sliding scale -- discontinue metformin for now since sugars are stable and with mild elevated creatinine  History of renal artery stenosis left -- status post stent placement November 2022  Hypertension -- continue Coreg and Telmisartan(home med)      Procedures: Family communication : none Consults : [facility] cardiology CODE STATUS: full DVT Prophylaxis : enoxaparin Level of care: Progressive Status is: Observation The patient remains OBS appropriate and will d/c before 2 midnights.   Remains stable will consider discharge tomorrow. Patient in agreement. TOTAL TIME TAKING CARE OF THIS PATIENT: 35 minutes.  >50% time spent on counselling and coordination of care  Note: This dictation was prepared with Dragon dictation along with smaller phrase technology. Any transcriptional errors that result from this process are unintentional.  Enedina Finner M.D    [facility]   CC: Primary care physician; Sherlene Shams, MD

## 2022-01-18 NOTE — Telephone Encounter (Addendum)
Since patient is currently admitted we cannot scheduled the cardiac cath. Spoke with Britta Mccreedy in scheduling. She will make note and schedule the patient when she is back on Monday 9/11.  Patient cardiac cath will be scheduled on 01/28/22 @ 11:30 am with Dr. Kirke Corin.

## 2022-01-18 NOTE — Progress Notes (Signed)
Eeg done 

## 2022-01-18 NOTE — Procedures (Signed)
Patient Name: Courtney Lopez  MRN: [id number]  Epilepsy Attending: Charlsie Quest  Referring Physician/Provider: Lovenia Kim, DO  Date: 01/18/2022 Duration: 36.02 mins  Patient history: 61 year old female with near syncope.  EEG to evaluate for seizure.  Level of alertness: Awake  AEDs during EEG study: Xanax  Technical aspects: This EEG study was done with scalp electrodes positioned according to the 10-20 International system of electrode placement. Electrical activity was reviewed with band pass filter of 1-70Hz , sensitivity of 7 uV/mm, display speed of 25mm/sec with a 60Hz  notched filter applied as appropriate. EEG data were recorded continuously and digitally stored.  Video monitoring was available and reviewed as appropriate.  Description: The posterior dominant rhythm consists of 8-9 Hz activity of moderate voltage (25-35 uV) seen predominantly in posterior head regions, symmetric and reactive to eye opening and eye closing. Physiologic photic driving was seen during photic stimulation.  Hyperventilation was not performed.     IMPRESSION: This study is within normal limits. No seizures or epileptiform discharges were seen throughout the recording.  A normal interictal EEG does not exclude nor support the diagnosis of epilepsy.   [name] 

## 2022-01-18 NOTE — Consult Note (Signed)
Cardiology Consultation   Patient ID: Courtney Lopez MRN: [id number]; DOB: April 12, 1961  Admit date: 01/17/2022 Date of Consult: 01/18/2022  PCP:  Crecencio Mc, MD   [facility] Cardiologist:  Ida Rogue, MD        Patient Profile:   Courtney Lopez is a 61 y.o. female with a hx of hypertension, hyperlipidemia, diabetes, aortic stenosis, thoracic aortic aneurysm, and renal artery stenosis who is being seen 01/18/2022 for the evaluation of near syncope at the request of Dr. Tobie Poet.  History of Present Illness:   Courtney Lopez is a 61 year old female with a complex past medical history of hypertension, hyperlipidemia, renal artery stenosis, bicuspid aortic valve with moderate to severe aortic stenosis, ascending aortic aneurysm, hyperlipidemia, and type two diabetes.   She had a CT of the aorta in 2020 which showed evidence of significant left renal artery stenosis with left significant calcifications.  It was suggestive of fibromuscular dysplasia.  Left renal artery stenosis was confirmed with renal artery duplex.  Repeat renal artery duplex in 2021 showed significant stenosis in the mid left renal artery with peak velocity of 439.  Carotid Doppler showed no significant disease.  Blood pressures were initially controlled with medications with stable renal function and thus renal artery angiography was not pursued.  However she had worsening renal function and blood pressure control in late of 2022.  She had undergone angiography in November 2022 which showed 95% stenosis of the proximal left renal artery with no significant disease affecting the right renal artery.  Successful stent placement was done of the left renal artery.  She had repeat renal artery duplex in March which revealed patent left renal artery stent.  She did have recent echocardiogram which was completed that showed LVEF 60-65%.  No regional wall motion abnormalities, moderate asymmetric left  ventricular hypertrophy of the basal septal segment, G1 DD, mild mitral regurgitation, the aortic valve has moderate calcification, aortic valve regurgitation is not visualized, moderate aortic valve stenosis by mean gradient measures 23.2 mmHg, aortic valve V-max measures 3.76M/S, severe stenosis by aortic valve area by VTI measures 0.68 cm, there is borderline dilatation of the aortic root measuring 37 mm, moderate dilatation of the ascending aorta measuring 45 mm.   She was last seen in clinic on 01/02/2022 with complaints of increasing palpitations, dizziness and presyncope that was associated with nausea and visual changes.  Blood pressure had been better controlled she was scheduled for echocardiogram and placed on a 14-day ZIO monitor.  She was followed by her PCP as well who had advised her to hold her carvedilol with thoughts that her dizziness was orthostasis.  She presented to the emergency department at [facility] 01/17/2022 with complaints of weakness and near syncope.  She stated that she had been dealing with dizziness and near syncopal episodes for 3 months intermittently.  They have progressively come more frequent and severe, now occurring on a daily basis and limits her ability to work.  She states has palpitations with shortness of breath with these episodes along with diaphoresis.  She denies any chest pains, or chest pressure, but continues to endorse dizziness, lightheadedness, headache, and visual disturbances.  Initial vitals in the emergency department: Blood pressure 212/122, pulse 79, respirations of 12, oxygen saturation of 100%  Pertinent labs: Glucose 109, BUN 26, creatinine 1.12, GFR 55, Respiratory panel is negative, high-sensitivity troponin of 6  Imaging: Chest x-ray revealed heart size and mediastinal contours within normal limits, lungs are  clear, visualized skeletal structures were unremarkable with the impression of no active disease; MRI of the brain revealed no acute  intercranial process no etiology seen for the patient's near syncopal episodes, no evidence of acute or subacute infarct  Medications administered in the emergency department: Carvedilol 25 mg, labetalol 10 mg IVP  Past Medical History:  Diagnosis Date   Ankle fracture 12/31/2018   left   Anxiety    Arthritis    neck, back, ankle, knees   Ascending aortic aneurysm ([place])    a. 04/2016 CTA: 4.6cm Asc TAA; b. 04/2018 CTA: 4.8cm; c. 10/2018 CTA: 4.9x4.8cm; d. 12/2019 Echo: 4.8 cm.   Bicuspid aortic valve    Breast screening, unspecified    Carotid arterial disease ([place])    a. 01/2020 U/S: 1-39% bilat ICA stenosis. No evidence of FMD.   Cervicalgia    cervical stenosis   Diabetes mellitus without complication (HCC)    Elevated coronary artery calcium score    a. 03/2016 Cardiac CT: Ca2+ = 78 (90th %'ile).   Fibromuscular dysplasia (HCC)    Heart murmur    History of hiatal hernia    Metatarsal fracture 2007   5th, from wearing high heels   Moderate aortic stenosis    a. 12/2019 Echo: EF 60-65%, no rwma, mild LVH, gr1 DD, nl RV size/fxn, triv MR, mild AI, mod AS, mod-sev dil of Asc Ao - 72mm.  AoV reported as tricuspid but prev known to be bicuspid.   Multiple thyroid nodules    a. 10/2019 U/S: R mid thyroid nodule - f/u 5 yrs.   Other symptoms involving abdomen and pelvis(789.9)    Renal artery stenosis ([place])    a. 12/2019 Renal duplex: Nl RRA. LRA >60% stenosis.   Renal cyst, right    a. 12/2019 Duplex: 5.8 x 5.7 cm R renal cyst in lower pole.   Retinal hemorrhage    Symptomatic menopausal or female climacteric states    Unspecified essential hypertension    Wears contact lenses     Past Surgical History:  Procedure Laterality Date   ABDOMINAL HYSTERECTOMY     ANKLE ARTHROSCOPY WITH RECONSTRUCTION Left 08/24/2020   Procedure: LEFT ANKLE ARTHROSCOPIC DEBRIDEMENT, EXTENSIVE, ARTHOSCROPIC TREATMENT OF TALUS OSTEOCHONDRAL LESION, LATERAL LIGAMENT RECONSTRUCTION;  Surgeon: Erle Crocker, MD;  Location: [facility];  Service: Orthopedics;  Laterality: Left;   APPENDECTOMY     done during oophorectomy   CESAREAN SECTION     x 2   ETHMOIDECTOMY Left 03/02/2020   Procedure: ETHMOIDECTOMY;  Surgeon: Margaretha Sheffield, MD;  Location: [facility];  Service: ENT;  Laterality: Left;   gyn surgery     hysterectomy- done for adenomysosis and endometriosis   IMAGE GUIDED SINUS SURGERY N/A 03/02/2020   Procedure: IMAGE GUIDED SINUS SURGERY;  Surgeon: Margaretha Sheffield, MD;  Location: [place];  Service: ENT;  Laterality: N/A;  need disk PLACED DISK ON OR CHARGE NURSE DESK 9-17 KP   IMAGE GUIDED SINUS SURGERY Left 04/13/2020   Procedure: IMAGE GUIDED SINUS SURGERY;  Surgeon: Margaretha Sheffield, MD;  Location: [place];  Service: ENT;  Laterality: Left;  needs stryker disk per Justice Rocher use same disk as before (October) put on charge nurses desk. DS 04/03/20   MAXILLARY ANTROSTOMY Left 03/02/2020   Procedure: MAXILLARY ANTROSTOMY;  Surgeon: Margaretha Sheffield, MD;  Location: [place];  Service: ENT;  Laterality: Left;   MAXILLARY ANTROSTOMY Left 04/13/2020   Procedure: MAXILLECTOMY;  Surgeon: Margaretha Sheffield, MD;  Location: [place];  Service: ENT;  Laterality: Left;   OOPHORECTOMY     unilateral, due to ruptured ovarian cysts   OSTEOTOMY MAXILLARY     due to bite problems   PERIPHERAL VASCULAR INTERVENTION  03/28/2021   Procedure: PERIPHERAL VASCULAR INTERVENTION;  Surgeon: Wellington Hampshire, MD;  Location: [facility];  Service: Cardiovascular;;   RENAL ANGIOGRAPHY N/A 03/28/2021   Procedure: RENAL ANGIOGRAPHY;  Surgeon: Wellington Hampshire, MD;  Location: [place] [facility];  Service: Cardiovascular;  Laterality: N/A;   TUBAL LIGATION       Home Medications:  Prior to Admission medications   Medication Sig Start Date End Date Taking? Authorizing Provider  ALPRAZolam (XANAX) 0.25 MG tablet Take 1 tablet (0.25 mg total)  by mouth 2 (two) times daily as needed. 10/01/21  Yes Crecencio Mc, MD  aspirin EC 81 MG tablet Take 1 tablet (81 mg total) by mouth daily. Swallow whole. 03/23/21  Yes Wellington Hampshire, MD  carvedilol (COREG) 25 MG tablet Take one tablet  by mouth 2 (two) times daily with a meal. 10/26/21 10/26/22 Yes Dutch Quint B, FNP  estradiol (ESTRACE) 0.1 MG/GM vaginal cream INSERT 1/4 APPLICATORFUL VAGINALLY TWO TIMES A WEEK AS DIRECTED 12/28/21 12/28/22 Yes Crecencio Mc, MD  fluticasone (FLONASE) 50 MCG/ACT nasal spray PLACE 2 SPRAYS INTO BOTH NOSTRILS DAILY. 12/28/21 12/28/22 Yes Crecencio Mc, MD  hydrALAZINE (APRESOLINE) 25 MG tablet Take 1 tablet (25 mg total) by mouth 3 (three) times daily. Patient taking differently: Take 25 mg by mouth 3 (three) times daily as needed (for high BP). 01/03/21  Yes Gollan, Kathlene November, MD  HYDROcodone-acetaminophen (NORCO) 10-325 MG tablet Take 1 tablet by mouth every 6 (six) hours as needed for severe pain. 09/21/21 03/20/22 Yes Crecencio Mc, MD  metFORMIN (GLUCOPHAGE-XR) 750 MG 24 hr tablet Take 1 tablet by mouth daily with breakfast. 12/21/21 12/21/22 Yes Crecencio Mc, MD  rosuvastatin (CRESTOR) 5 MG tablet Take 1 tablet (5 mg total) by mouth at bedtime. 01/02/22  Yes Gollan, Kathlene November, MD  sodium fluoride (SF 5000 PLUS) 1.1 % CREA dental cream Brush teeth thoroughly for 2 minutes, do not rinse afterwards. 04/03/21  Yes   telmisartan (MICARDIS) 80 MG tablet Take 1 tablet (80 mg total) by mouth daily. 10/18/21  Yes Crecencio Mc, MD  traMADol (ULTRAM) 50 MG tablet TAKE 2 TABLETS (100 MG TOTAL) BY MOUTH EVERY 6 (SIX) HOURS AS NEEDED. 01/09/22  Yes Crecencio Mc, MD  amLODipine (NORVASC) 5 MG tablet Take 1 tablet (5 mg total) by mouth daily. 09/21/21   Crecencio Mc, MD  Menthol, Topical Analgesic, (BIOFREEZE EX) Apply 1 application topically at bedtime. Applied to neck area    [provider]  Semaglutide, 1 MG/DOSE, 4 MG/3ML SOPN Inject 1 mg as directed  once a week. 10/25/21   Crecencio Mc, MD  spironolactone (ALDACTONE) 25 MG tablet Take 1 tablet (25 mg total) by mouth daily. 04/19/20 05/22/20  Wellington Hampshire, MD    Inpatient Medications: Scheduled Meds:  aspirin EC  81 mg Oral Daily   carvedilol  25 mg Oral BID WC   enoxaparin (LOVENOX) injection  40 mg Subcutaneous QHS   irbesartan  300 mg Oral Daily   potassium chloride  40 mEq Oral Once   rosuvastatin  5 mg Oral QHS   Continuous Infusions:  PRN Meds: acetaminophen **OR** acetaminophen, ALPRAZolam, hydrALAZINE, ondansetron **OR** ondansetron (ZOFRAN) IV, mouth rinse, senna-docusate,  traMADol  Allergies:    Allergies  Allergen Reactions   Hctz [Hydrochlorothiazide] Other (See Comments)    Decreased gfr   Prozac [Fluoxetine Hcl] Palpitations    Social History:   Social History   Socioeconomic History   Marital status: Married    Spouse name: Not on file   Number of children: 3   Years of education: Not on file   Highest education level: Not on file  Occupational History   Not on file  Tobacco Use   Smoking status: Never   Smokeless tobacco: Never  Vaping Use   Vaping Use: Never used  Substance and Sexual Activity   Alcohol use: Yes    Comment: rare   Drug use: No   Sexual activity: Not on file  Other Topics Concern   Not on file  Social History Narrative   Lives with spouse.   Social Determinants of Health   Financial Resource Strain: Not on file  Food Insecurity: Not on file  Transportation Needs: Not on file  Physical Activity: Not on file  Stress: Not on file  Social Connections: Not on file  Intimate Partner Violence: Not on file    Family History:    Family History  Problem Relation Age of Onset   Diabetes Mother    Aortic stenosis Mother    Cancer Father        leukemia   Retinoblastoma Son    Melanoma Son    Cancer Maternal Aunt        breast   Breast cancer Maternal Aunt 101   Cancer Grandchild 1       retinoblastoma   Cancer  Granddaughter      ROS:  Please see the history of present illness.  Review of Systems  Constitutional:  Positive for malaise/fatigue.  Eyes:  Positive for double vision.  Musculoskeletal:  Positive for neck pain.  Neurological:  Positive for dizziness, weakness and headaches.    All other ROS reviewed and negative.     Physical Exam/Data:   Vitals:   01/18/22 0500 01/18/22 0600 01/18/22 0700 01/18/22 0738  BP:    (!) 157/98  Pulse:    81  Resp: 11 14 18 16   Temp:    97.9 F (36.6 C)  TempSrc:      SpO2:    100%  Weight:      Height:       No intake or output data in the 24 hours ending 01/18/22 0930    01/17/2022    8:05 PM 01/02/2022   10:42 AM 10/25/2021    8:37 AM  Last 3 Weights  Weight (lbs) 159 lb 13.3 oz 163 lb 3.2 oz 166 lb 6 oz  Weight (kg) 72.5 kg 74.027 kg 75.467 kg     Body mass index is 25.8 kg/m.  General:  Well nourished, well developed, in no acute distress HEENT: normal Neck: no JVD appreciated Vascular: No carotid bruits; Distal pulses 2+ bilaterally Cardiac:  normal S1, S2; RRR; III/VI systolic murmur without rubs or gallops Lungs:  clear to auscultation bilaterally, no wheezing, rhonchi or rales, respirations are unlabored on room air at rest Abd: soft, nontender, no hepatomegaly  Ext: no edema Musculoskeletal:  No deformities, BUE and BLE strength normal and equal Skin: warm and dry  Neuro:  CNs 2-12 intact, no focal abnormalities noted Psych:  Normal affect   EKG:  The EKG was personally reviewed and demonstrates:  sinus rate of 86 with LVH, and  LAD Telemetry:  Telemetry was personally reviewed and demonstrates:  sinus rate of 77  Relevant CV Studies: TTE 12/25/21 1. Left ventricular ejection fraction, by estimation, is 60 to 65%. The  left ventricle has normal function. The left ventricle has no regional  wall motion abnormalities. There is moderate asymmetric left ventricular  hypertrophy of the basal-septal  segment. Left ventricular  diastolic parameters are consistent with Grade I  diastolic dysfunction (impaired relaxation).   2. Right ventricular systolic function is normal. The right ventricular  size is normal.   3. The mitral valve is normal in structure. Mild mitral valve  regurgitation. No evidence of mitral stenosis.   4. The aortic valve has moderate calcification. Aortic valve  regurgitation is not visualized. Moderate aortic valve stenosis by mean  gradient measures 23.2 mmHg. Aortic valve Vmax measures 3.22 m/s. Severe  stenosis by aortic valve area, by VTI measures  0.68 cm sq   5. There is borderline dilatation of the aortic root, measuring 37 mm.  There is moderate dilatation of the ascending aorta, measuring 45 mm.   6. The inferior vena cava is normal in size with greater than 50%  respiratory variability, suggesting right atrial pressure of 3 mmHg.   Laboratory Data:  High Sensitivity Troponin:   Recent Labs  Lab 01/17/22 1532  TROPONINIHS 6     Chemistry Recent Labs  Lab 01/17/22 1545 01/18/22 0418  NA 138 138  K 3.9 3.2*  CL 100 105  CO2 26 29  GLUCOSE 109* 91  BUN 26* 27*  CREATININE 1.12* 1.58*  CALCIUM 9.9 9.2  GFRNONAA 55* 37*  ANIONGAP 12 4*    No results for input(s): "PROT", "ALBUMIN", "AST", "ALT", "ALKPHOS", "BILITOT" in the last 168 hours. Lipids No results for input(s): "CHOL", "TRIG", "HDL", "LABVLDL", "LDLCALC", "CHOLHDL" in the last 168 hours.  Hematology Recent Labs  Lab 01/17/22 1532 01/18/22 0418  WBC 9.1 8.9  RBC 4.79 4.47  HGB 13.1 12.4  HCT 40.9 37.0  MCV 85.4 82.8  MCH 27.3 27.7  MCHC 32.0 33.5  RDW 12.3 12.3  PLT 297 228   Thyroid No results for input(s): "TSH", "FREET4" in the last 168 hours.  BNPNo results for input(s): "BNP", "PROBNP" in the last 168 hours.  DDimer No results for input(s): "DDIMER" in the last 168 hours.   Radiology/Studies:  MR BRAIN WO CONTRAST  Result Date: 01/17/2022 CLINICAL DATA:  Near syncopal episode EXAM: MRI  HEAD WITHOUT CONTRAST TECHNIQUE: Multiplanar, multiecho pulse sequences of the brain and surrounding structures were obtained without intravenous contrast. COMPARISON:  10/21/2018 MRI head FINDINGS: Brain: No restricted diffusion to suggest acute or subacute infarct. No acute hemorrhage, mass, mass effect, or midline shift. No hemosiderin deposition to suggest remote hemorrhage. No hydrocephalus or extra-axial collection. Scattered T2 hyperintense signal in the periventricular white matter, likely the sequela of mild chronic small vessel ischemic disease. Normal pituitary and craniocervical junction. Vascular: Normal arterial flow voids. Skull and upper cervical spine: Normal marrow signal. Sinuses/Orbits: Possible postsurgical changes. Mild mucosal thickening in the left-greater-than-right maxillary sinus. The orbits are unremarkable. Other: Fluid in the left-greater-than-right mastoid air cells. IMPRESSION: No acute intracranial process. No etiology is seen for the patient's near syncopal episodes. No evidence of acute or subacute infarct. Electronically Signed   By: Wiliam Ke M.D.   On: 01/17/2022 23:41   DG Chest Port 1 View  Result Date: 01/17/2022 CLINICAL DATA:  Weakness near syncope EXAM: PORTABLE CHEST 1 VIEW COMPARISON:  09/21/2021 FINDINGS:  The heart size and mediastinal contours are within normal limits. Both lungs are clear. The visualized skeletal structures are unremarkable. IMPRESSION: No active disease. Electronically Signed   By: Donavan Foil M.D.   On: 01/17/2022 20:14     Assessment and Plan:   Hypertensive urgency -According to records outpatient PCP had advised patient to hold carvedilol due to concerns for hypertension causing dizziness, near syncope at home -On arrival to the emergency department blood pressure was over 200/100 -Patient stated that she had been changing her medication regimen around, had come home fallen asleep and missed a dose of her medication which  escalated her blood pressure -Blood pressure this morning 157/98 -Continue Coreg 25 mg twice daily, irbesartan 300 mg daily, as needed hydralazine IVP every 6 hours -Daily orthostatic vitals -Vitals per unit protocol  Near syncope -With continued dizziness while sitting with increased frequency multiple episodes and associated symptoms of blurry vision -MRI of the brain has been completed without contrast -EEG is ordered and pending -Neurology to follow -Patient has been recently wore an outpatient ZIO monitor awaiting results -Continue with fall precautions  Renal artery stenosis -Renal angiography with stent placement 03/2021 -Repeat renal artery ultrasound 07/2021 revealed patent left renal artery stent with no significant restenosis -Recommended study follow-up in 1 year  Bicuspid aortic valve with moderate/severe aortic stenosis -Recent echocardiogram done 12/25/2021 which revealed aortic valve with moderate calcification, moderate aortic valve stenosis by mean gradient measures 23.2 mmHg, aortic valve V-max measures 3.42M/S, severe stenosis by aortic valve area, VTI measures 0.68 cm -Patient already had follow-up made with Dr. Cyndia Bent 01/23/2021 to discuss potential surgery for valve replacement and aneurysm repair -With patient becoming more symptomatic with syncope and dizziness need for valve replacement is imminent -patient would benefit from [facility] to evaluate aortic valve gradient and rule out coronary artery disease   Hyperlipidemia -Last LDL of 57 on 09/21/2021 -Continue with rosuvastatin  Type II diabetes -Last A1c of 5.8 on 09/21/2021 -Management per IM  Hypokalemia -Potassium level 3.2 -Supplemental potassium given this morning -Monitor/trend/replete electrolytes as needed -BMP ordered for 3 PM -Daily BMP  AKI -Serum creatinine 1.58 -Baseline serum creatinine 1 -Serum creatinine 1.12 on admission -Patient encouraged to hydrate with fluids today -Monitor urine  output -Creatinine remains however low afternoon BMP consider gentle hydration -Avoid nephrotoxic agents were able  9.   Ascending aortic aneurysm -Measuring 4.8 cm on CT angio done 12/28/2021 -Recommendations for semiannual imaging follow-up -Has been stable over the last several years -Patient will undergo aneurysm repair during valve repair   Risk Assessment/Risk Scores:                For questions or updates, please contact [facility] Please consult [email or web address] for contact info under    Signed, Geonna Lockyer, NP  01/18/2022 9:30 AM

## 2022-01-19 DIAGNOSIS — E782 Mixed hyperlipidemia: Secondary | ICD-10-CM | POA: Diagnosis not present

## 2022-01-19 DIAGNOSIS — I701 Atherosclerosis of renal artery: Secondary | ICD-10-CM

## 2022-01-19 DIAGNOSIS — Z952 Presence of prosthetic heart valve: Secondary | ICD-10-CM | POA: Diagnosis not present

## 2022-01-19 DIAGNOSIS — I77810 Thoracic aortic ectasia: Secondary | ICD-10-CM | POA: Diagnosis not present

## 2022-01-19 DIAGNOSIS — Z20822 Contact with and (suspected) exposure to covid-19: Secondary | ICD-10-CM | POA: Diagnosis not present

## 2022-01-19 DIAGNOSIS — E785 Hyperlipidemia, unspecified: Secondary | ICD-10-CM

## 2022-01-19 DIAGNOSIS — I110 Hypertensive heart disease with heart failure: Secondary | ICD-10-CM | POA: Diagnosis not present

## 2022-01-19 DIAGNOSIS — I951 Orthostatic hypotension: Secondary | ICD-10-CM | POA: Diagnosis not present

## 2022-01-19 DIAGNOSIS — E1121 Type 2 diabetes mellitus with diabetic nephropathy: Secondary | ICD-10-CM | POA: Diagnosis not present

## 2022-01-19 DIAGNOSIS — I160 Hypertensive urgency: Secondary | ICD-10-CM | POA: Diagnosis not present

## 2022-01-19 DIAGNOSIS — E1169 Type 2 diabetes mellitus with other specified complication: Secondary | ICD-10-CM | POA: Diagnosis not present

## 2022-01-19 DIAGNOSIS — I509 Heart failure, unspecified: Secondary | ICD-10-CM | POA: Diagnosis not present

## 2022-01-19 DIAGNOSIS — R55 Syncope and collapse: Secondary | ICD-10-CM | POA: Diagnosis not present

## 2022-01-19 DIAGNOSIS — Q231 Congenital insufficiency of aortic valve: Secondary | ICD-10-CM | POA: Diagnosis not present

## 2022-01-19 DIAGNOSIS — Z7984 Long term (current) use of oral hypoglycemic drugs: Secondary | ICD-10-CM | POA: Diagnosis not present

## 2022-01-19 NOTE — Discharge Summary (Signed)
Physician Discharge Summary   Patient: Courtney Lopez MRN: [id number] DOB: [birth year]  Admit date:     01/17/2022  Discharge date: 01/19/22  Discharge Physician: Courtney Lopez   PCP: Courtney Shams, MD   Recommendations at discharge:   F/u [facility] cardiology and Courtney Lopez on your appt F/u Courtney Lopez PCP in 1-2 weeks  Discharge Diagnoses: Recurrent Syncope with orthostasis Moderate- severe AS HTN   Hospital Course: Courtney Lopez is a 61 year old female with history of left renal artery stenosis status post stent placement in November 2022, bicuspid aortic valve, hyperlipidemia, non-insulin-dependent diabetes mellitus, heart failure preserved ejection fraction, moderate aortic valve stenosis, who presents emergency department from home for chief concerns of near syncopal episode.   Recurrent Near syncopal episodes in the setting of orthostatic hypotension with episodes of hypertensive urgency History of aortic stenosis moderate to severe with standing aortic aneurysm /bicuspid aortic valve -- patient seen by Drs. Lopez/Courtney Lopez cardiology--- recommendation to keep appointment with cardiothoracic surgery Courtney Lopez next week for evaluation  of aortic aneurysm and aortic valve stenosis. -- MRI brain negative --EEG neg --US carotid no significant stenosis --recent 12/25/2021 complete echo: LV ejection fraction estimated at 60 to 65%, grade 1 diastolic dysfunction.  Moderate aortic valve stenosis.  Aortic valve has moderate calcification.   Hyperlipidemia -- continue statins   type II diabetes, well-controlled -- A1c 5.8 -- will continue sliding scale -- discontinue metformin for now since sugars are stable and with mild elevated creatinine   History of renal artery stenosis left -- status post stent placement November 2022   Hypertension -- continue Coreg and Telmisartan(home med)     overall improving D/c home today with outpt appt CT surgery and cardiology   Family  communication : none Consults : Courtney Lopez cardiology CODE STATUS: full DVT Prophylaxis : enoxaparin     Pain control - Courtney Lopez was reviewed. and patient was instructed, not to drive, operate heavy machinery, perform activities at heights, swimming or participation in water activities or provide baby-sitting services while on Pain, Sleep and Anxiety Medications; until their outpatient Physician has advised to do so again. Also recommended to not to take more than prescribed Pain, Sleep and Anxiety Medications.   Disposition: Home Diet recommendation:  Discharge Diet Orders (From admission, onward)     Start     Ordered   01/19/22 0000  Diet - low sodium heart healthy        01/19/22 0926   01/19/22 0000  Diet Carb Modified        01/19/22 0926           Cardiac and Carb modified diet DISCHARGE MEDICATION: Allergies as of 01/19/2022       Reactions   Hctz [hydrochlorothiazide] Other (See Comments)   Decreased gfr   Prozac [fluoxetine Hcl] Palpitations        Medication List     STOP taking these medications    metFORMIN 750 Lopez 24 hr tablet Commonly known as: GLUCOPHAGE-XR   Ozempic (1 Lopez/DOSE) 4 Lopez/3ML Sopn Generic drug: Semaglutide (1 Lopez/DOSE)       TAKE these medications    ALPRAZolam 0.25 Lopez tablet Commonly known as: XANAX Take 1 tablet (0.25 Lopez total) by mouth 2 (two) times daily as needed.   amLODipine 5 Lopez tablet Commonly known as: NORVASC Take 1 tablet (5 Lopez total) by mouth daily.   aspirin EC 81 Lopez tablet Take 1 tablet (81 Lopez  total) by mouth daily. Swallow whole.   BIOFREEZE EX Apply 1 application topically at bedtime. Applied to neck area   carvedilol 25 Lopez tablet Commonly known as: COREG Take one tablet  by mouth 2 (two) times daily with a meal.   estradiol 0.1 Lopez/GM vaginal cream Commonly known as: ESTRACE INSERT 1/4 APPLICATORFUL VAGINALLY TWO TIMES A WEEK AS DIRECTED   fluticasone 50  MCG/ACT nasal spray Commonly known as: FLONASE PLACE 2 SPRAYS INTO BOTH NOSTRILS DAILY.   hydrALAZINE 25 Lopez tablet Commonly known as: APRESOLINE Take 1 tablet (25 Lopez total) by mouth 3 (three) times daily. What changed:  when to take this reasons to take this   HYDROcodone-acetaminophen 10-325 Lopez tablet Commonly known as: NORCO Take 1 tablet by mouth every 6 (six) hours as needed for severe pain.   rosuvastatin 5 Lopez tablet Commonly known as: CRESTOR Take 1 tablet (5 Lopez total) by mouth at bedtime.   SF 5000 Plus 1.1 % Crea dental cream Generic drug: sodium fluoride Brush teeth thoroughly for 2 minutes, do not rinse afterwards.   telmisartan 80 Lopez tablet Commonly known as: MICARDIS Take 1 tablet (80 Lopez total) by mouth daily.   traMADol 50 Lopez tablet Commonly known as: ULTRAM TAKE 2 TABLETS (100 Lopez TOTAL) BY MOUTH EVERY 6 (SIX) HOURS AS NEEDED.        Follow-up Information     Courtney Mc, MD. Schedule an appointment as soon as possible for a visit in 1 week(s).   Specialty: Internal Medicine Contact information: [place] [address] [phone]         Courtney Merritts, MD .   Specialty: Cardiology Contact information: [place] [address] [phone]                Discharge Exam: Courtney Lopez Courtney Lopez   01/17/22 2005  Weight: 72.5 kg     Condition at discharge: fair  The results of significant diagnostics from this hospitalization (including imaging, microbiology, ancillary and laboratory) are listed below for reference.   Imaging Studies: US Carotid Bilateral  Result Date: 01/18/2022 CLINICAL DATA:  61 year old female with a history of syncope EXAM: BILATERAL CAROTID DUPLEX ULTRASOUND TECHNIQUE: Courtney Lopez scale imaging, color Doppler and duplex ultrasound were performed of bilateral carotid and vertebral arteries in the neck. COMPARISON:  None Available. FINDINGS: Criteria: Quantification of  carotid stenosis is based on velocity parameters that correlate the residual internal carotid diameter with NASCET-based stenosis levels, using the diameter of the distal internal carotid lumen as the denominator for stenosis measurement. The following velocity measurements were obtained: RIGHT ICA:  Systolic 73 cm/sec, Diastolic 34 cm/sec CCA:  61 cm/sec SYSTOLIC ICA/CCA RATIO:  1.2 ECA:  35 cm/sec LEFT ICA:  Systolic 66 cm/sec, Diastolic 29 cm/sec CCA:  54 cm/sec SYSTOLIC ICA/CCA RATIO:  1.2 ECA:  47 cm/sec Right Brachial SBP: Not acquired Left Brachial SBP: Not acquired RIGHT CAROTID ARTERY: No significant calcified disease of the right common carotid artery. Intermediate waveform maintained. Homogeneous plaque without significant calcifications at the right carotid bifurcation. Low resistance waveform of the right ICA. Tortuosity RIGHT VERTEBRAL ARTERY: Antegrade flow with low resistance waveform. LEFT CAROTID ARTERY: No significant calcified disease of the left common carotid artery. Intermediate waveform maintained. Homogeneous plaque at the left carotid bifurcation without significant calcifications. Low resistance waveform of the left ICA. Tortuosity LEFT VERTEBRAL ARTERY:  Antegrade flow with low resistance waveform. IMPRESSION: Color duplex indicates minimal homogeneous plaque, with  no hemodynamically significant stenosis by duplex criteria in the extracranial cerebrovascular circulation. Signed, Dulcy Fanny. Nadene Rubins, RPVI [facility] Radiology Electronically Signed   By: Corrie Mckusick D.O.   On: 01/18/2022 16:01   EEG adult  Result Date: 01/18/2022 Lora Havens, MD     01/18/2022  2:13 PM Patient Name: NORIE GREENLEES MRN: [id number] Epilepsy Attending: Lora Havens Referring Physician/Provider: Criss Alvine, DO Date: 01/18/2022 Duration: 36.02 mins Patient history: 61 year old female with near syncope.  EEG to evaluate for seizure. Level of  alertness: Awake AEDs during EEG study: Xanax Technical aspects: This EEG study was done with scalp electrodes positioned according to the 10-20 International system of electrode placement. Electrical activity was reviewed with band pass filter of 1-70Hz , sensitivity of 7 uV/mm, display speed of 32mm/sec with a 60Hz  notched filter applied as appropriate. EEG data were recorded continuously and digitally stored.  Video monitoring was available and reviewed as appropriate. Description: The posterior dominant rhythm consists of 8-9 Hz activity of moderate voltage (25-35 uV) seen predominantly in posterior head regions, symmetric and reactive to eye opening and eye closing. Physiologic photic driving was seen during photic stimulation.  Hyperventilation was not performed.   IMPRESSION: This study is within normal limits. No seizures or epileptiform discharges were seen throughout the recording. A normal interictal EEG does not exclude nor support the diagnosis of epilepsy. Lora Havens   MR BRAIN WO CONTRAST  Result Date: 01/17/2022 CLINICAL DATA:  Near syncopal episode EXAM: MRI HEAD WITHOUT CONTRAST TECHNIQUE: Multiplanar, multiecho pulse sequences of the brain and surrounding structures were obtained without intravenous contrast. COMPARISON:  10/21/2018 MRI head FINDINGS: Brain: No restricted diffusion to suggest acute or subacute infarct. No acute hemorrhage, mass, mass effect, or midline shift. No hemosiderin deposition to suggest remote hemorrhage. No hydrocephalus or extra-axial collection. Scattered T2 hyperintense signal in the periventricular white matter, likely the sequela of mild chronic small vessel ischemic disease. Normal pituitary and craniocervical junction. Vascular: Normal arterial flow voids. Skull and upper cervical spine: Normal marrow signal. Sinuses/Orbits: Possible postsurgical changes. Mild mucosal thickening in the left-greater-than-right maxillary sinus. The orbits are unremarkable.  Other: Fluid in the left-greater-than-right mastoid air cells. IMPRESSION: No acute intracranial process. No etiology is seen for the patient's near syncopal episodes. No evidence of acute or subacute infarct. Electronically Signed   By: Merilyn Baba M.D.   On: 01/17/2022 23:41   DG Chest Port 1 View  Result Date: 01/17/2022 CLINICAL DATA:  Weakness near syncope EXAM: PORTABLE CHEST 1 VIEW COMPARISON:  09/21/2021 FINDINGS: The heart size and mediastinal contours are within normal limits. Both lungs are clear. The visualized skeletal structures are unremarkable. IMPRESSION: No active disease. Electronically Signed   By: Donavan Foil M.D.   On: 01/17/2022 20:14   CT ANGIO CHEST AORTA W/CM & OR WO/CM  Result Date: 12/28/2021 CLINICAL DATA:  Thoracic aortic aneurysm EXAM: CT ANGIOGRAPHY CHEST WITH CONTRAST TECHNIQUE: Multidetector CT imaging of the chest was performed using the standard protocol during bolus administration of intravenous contrast. Multiplanar CT image reconstructions and MIPs were obtained to evaluate the vascular anatomy. RADIATION DOSE REDUCTION: This exam was performed according to the departmental dose-optimization program which includes automated exposure control, adjustment of the mA and/or kV according to patient size and/or use of iterative reconstruction technique. CONTRAST:  25mL OMNIPAQUE IOHEXOL 350 Lopez/ML SOLN COMPARISON:  04/05/2021 FINDINGS: Cardiovascular: 4.8 cm ascending thoracic aortic aneurysm is again identified, without significant change  since prior exam. Normal caliber of the aortic arch and descending thoracic aorta. No evidence of dissection. The heart is unremarkable without pericardial effusion. Mediastinum/Nodes: Stable appearance of the thyroid, trachea, and esophagus. No pathologic mediastinal or hilar adenopathy. Lungs/Pleura: No acute airspace disease, effusion, or pneumothorax. Central airways are widely patent. Upper Abdomen: Chronic left renal atrophy. Stable  hepatic steatosis. Musculoskeletal: No acute or destructive bony lesions. Reconstructed images demonstrate no additional findings. Review of the MIP images confirms the above findings. IMPRESSION: 1. Stable 4.8 cm ascending thoracic aortic aneurysm. Ascending thoracic aortic aneurysm. Recommend semi-annual imaging followup by CTA or MRA and referral to cardiothoracic surgery if not already obtained. This recommendation follows 2010 ACCF/AHA/AATS/ACR/ASA/SCA/SCAI/SIR/STS/SVM Guidelines for the Diagnosis and Management of Patients With Thoracic Aortic Disease. Circulation. 2010; [id number]. Aortic aneurysm NOS (ICD10-I71.9) 2. Otherwise no acute intrathoracic process. Electronically Signed   By: Randa Ngo M.D.   On: 12/28/2021 08:56   ECHOCARDIOGRAM COMPLETE  Result Date: 12/25/2021    ECHOCARDIOGRAM REPORT   Patient Name:   KHARLEE ALTOMARE Date of Exam: 12/25/2021 Medical Rec #:  [id number]            Height:       66.0 in Accession #:    [id number]           Weight:       166.4 lb Date of Birth:  Nov 29, 1960             BSA:          1.849 m Patient Age:    71 years             BP:           140/84 mmHg Patient Gender: F                    HR:           74 bpm. Exam Location:  [place] Procedure: 2D Echo, Cardiac Doppler, Color Doppler and Intracardiac            Opacification Agent Indications:    I35.0 Nonrheumatic aortic (valve) stenosis  History:        Patient has prior history of Echocardiogram examinations, most                 recent 01/03/2021. Dilated aorta, RAS, Aortic Valve Disease,                 Signs/Symptoms:Murmur, Chest Pain and Dizziness/Lightheadedness;                 Risk Factors:Hypertension, Diabetes, Dyslipidemia and                 Non-Smoker.  Sonographer:    Pilar Jarvis RDMS, RVT, RDCS Referring Phys: [place]  1. Left ventricular ejection fraction, by estimation, is 60 to 65%. The left ventricle has normal function. The left ventricle has no  regional wall motion abnormalities. There is moderate asymmetric left ventricular hypertrophy of the basal-septal segment. Left ventricular diastolic parameters are consistent with Grade I diastolic dysfunction (impaired relaxation).  2. Right ventricular systolic function is normal. The right ventricular size is normal.  3. The mitral valve is normal in structure. Mild mitral valve regurgitation. No evidence of mitral stenosis.  4. The aortic valve has moderate calcification. Aortic valve regurgitation is not visualized. Moderate aortic valve stenosis by mean gradient measures 23.2 mmHg. Aortic valve Vmax measures 3.22 m/s. Severe stenosis by  aortic valve area, by VTI measures 0.68 cm sq  5. There is borderline dilatation of the aortic root, measuring 37 mm. There is moderate dilatation of the ascending aorta, measuring 45 mm.  6. The inferior vena cava is normal in size with greater than 50% respiratory variability, suggesting right atrial pressure of 3 mmHg. FINDINGS  Left Ventricle: Left ventricular ejection fraction, by estimation, is 60 to 65%. The left ventricle has normal function. The left ventricle has no regional wall motion abnormalities. Definity contrast agent was given IV to delineate the left ventricular  endocardial borders. The left ventricular internal cavity size was normal in size. There is moderate asymmetric left ventricular hypertrophy of the basal-septal segment. Left ventricular diastolic parameters are consistent with Grade I diastolic dysfunction (impaired relaxation). Right Ventricle: The right ventricular size is normal. No increase in right ventricular wall thickness. Right ventricular systolic function is normal. Left Atrium: Left atrial size was normal in size. Right Atrium: Right atrial size was normal in size. Pericardium: There is no evidence of pericardial effusion. Mitral Valve: The mitral valve is normal in structure. Mild mitral annular calcification. Mild mitral valve  regurgitation. No evidence of mitral valve stenosis. Tricuspid Valve: The tricuspid valve is normal in structure. Tricuspid valve regurgitation is mild . No evidence of tricuspid stenosis. Aortic Valve: The aortic valve is normal in structure. Aortic valve regurgitation is not visualized. Moderate aortic stenosis is present. Aortic valve mean gradient measures 23.2 mmHg. Aortic valve peak gradient measures 41.3 mmHg. Aortic valve area, by VTI measures 0.68 cm. Pulmonic Valve: The pulmonic valve was normal in structure. Pulmonic valve regurgitation is not visualized. No evidence of pulmonic stenosis. Aorta: The aortic root is normal in size and structure. There is borderline dilatation of the aortic root, measuring 37 mm. There is moderate dilatation of the ascending aorta, measuring 45 mm. Venous: The inferior vena cava is normal in size with greater than 50% respiratory variability, suggesting right atrial pressure of 3 mmHg. IAS/Shunts: No atrial level shunt detected by color flow Doppler.  LEFT VENTRICLE PLAX 2D LVIDd:         3.70 cm   Diastology LVIDs:         2.50 cm   LV e' medial:    3.49 cm/s LV PW:         1.30 cm   LV E/e' medial:  21.6 LV IVS:        1.50 cm   LV e' lateral:   3.89 cm/s LVOT diam:     1.90 cm   LV E/e' lateral: 19.4 LV SV:         48 LV SV Index:   26 LVOT Area:     2.84 cm  RIGHT VENTRICLE RV Basal diam:  3.50 cm RV S prime:     11.10 cm/s TAPSE (M-mode): 2.5 cm LEFT ATRIUM             Index        RIGHT ATRIUM           Index LA diam:        3.40 cm 1.84 cm/m   RA Area:     13.30 cm LA Vol (A2C):   57.8 ml 31.25 ml/m  RA Volume:   31.50 ml  17.03 ml/m LA Vol (A4C):   39.6 ml 21.41 ml/m LA Biplane Vol: 48.9 ml 26.44 ml/m  AORTIC VALVE  PULMONIC VALVE AV Area (Vmax):    0.77 cm      PV Vmax:       0.88 m/s AV Area (Vmean):   0.72 cm      PV Peak grad:  3.1 mmHg AV Area (VTI):     0.68 cm AV Vmax:           321.50 cm/s AV Vmean:          233.000 cm/s AV VTI:             0.711 m AV Peak Grad:      41.3 mmHg AV Mean Grad:      23.2 mmHg LVOT Vmax:         86.80 cm/s LVOT Vmean:        59.400 cm/s LVOT VTI:          0.170 m LVOT/AV VTI ratio: 0.24  AORTA Ao Root diam: 3.70 cm Ao Asc diam:  4.65 cm Ao Arch diam: 3.0 cm MITRAL VALVE                TRICUSPID VALVE MV Area (PHT): 1.97 cm     TR Peak grad:   33.2 mmHg MV Decel Time: 385 msec     TR Vmax:        288.00 cm/s MV E velocity: 75.40 cm/s MV A velocity: 120.00 cm/s  SHUNTS MV E/A ratio:  0.63         Systemic VTI:  0.17 m                             Systemic Diam: 1.90 cm Ida Rogue MD Electronically signed by Ida Rogue MD Signature Date/Time: 12/25/2021/1:00:11 PM    Final     Microbiology: Results for orders placed or performed during the hospital encounter of 01/17/22  SARS Coronavirus 2 by RT PCR (hospital order, performed in [facility] hospital lab) *cepheid single result test* Anterior Nasal Swab     Status: None   Collection Time: 01/17/22  7:48 PM   Specimen: Anterior Nasal Swab  Result Value Ref Range Status   SARS Coronavirus 2 by RT PCR NEGATIVE NEGATIVE Final    Comment: (NOTE) SARS-CoV-2 target nucleic acids are NOT DETECTED.  The SARS-CoV-2 RNA is generally detectable in upper and lower respiratory specimens during the acute phase of infection. The lowest concentration of SARS-CoV-2 viral copies this assay can detect is 250 copies / mL. A negative result does not preclude SARS-CoV-2 infection and should not be used as the sole basis for treatment or other patient management decisions.  A negative result may occur with improper specimen collection / handling, submission of specimen other than nasopharyngeal swab, presence of viral mutation(s) within the areas targeted by this assay, and inadequate number of viral copies (<250 copies / mL). A negative result must be combined with clinical observations, patient history, and epidemiological information.  Fact Sheet for  Patients:   https://www.Kyomi Hector.info/  Fact Sheet for Healthcare Providers: https://hall.com/  This test is not yet approved or  cleared by the [place] FDA and has been authorized for detection and/or diagnosis of SARS-CoV-2 by FDA under an Emergency Use Authorization (EUA).  This EUA will remain in effect (meaning this test can be used) for the duration of the COVID-19 declaration under Section 564(b)(1) of the Act, 21 U.S.C. section 360bbb-3(b)(1), unless the authorization is terminated or revoked sooner.  Performed at [facility]  Lab, [place], [place] [address]     Labs: CBC: Recent Labs  Lab 01/17/22 1532 01/18/22 0418  WBC 9.1 8.9  HGB 13.1 12.4  HCT 40.9 37.0  MCV 85.4 82.8  PLT 297 [id number]   Basic Metabolic Panel: Recent Labs  Lab 01/17/22 1545 01/18/22 0418 01/18/22 1635  NA 138 138 137  K 3.9 3.2* 5.0  CL 100 105 102  CO2 26 29 26   GLUCOSE 109* 91 90  BUN 26* 27* 23  CREATININE 1.12* 1.58* 1.41*  CALCIUM 9.9 9.2 9.0   Liver Function Tests: No results for input(s): "AST", "ALT", "ALKPHOS", "BILITOT", "PROT", "ALBUMIN" in the last 168 hours. CBG: Recent Labs  Lab 01/17/22 2138 01/18/22 0809 01/18/22 2048  GLUCAP 93 71 88    Discharge time spent: greater than 30 minutes.  Signed: Fritzi Mandes, MD [facility] 01/19/2022

## 2022-01-19 NOTE — Progress Notes (Signed)
Progress Note  Patient Name: Courtney Lopez Date of Encounter: 01/19/2022  Primary Cardiologist: Mariah Milling  Subjective   No chest pain or dyspnea. She does continue to note some dizziness if she is upright.   Inpatient Medications    Scheduled Meds:  aspirin EC  81 mg Oral Daily   carvedilol  25 mg Oral BID WC   enoxaparin (LOVENOX) injection  40 mg Subcutaneous QHS   irbesartan  300 mg Oral Daily   rosuvastatin  5 mg Oral QHS   Continuous Infusions:  PRN Meds: acetaminophen **OR** acetaminophen, ALPRAZolam, hydrALAZINE, ondansetron **OR** ondansetron (ZOFRAN) IV, mouth rinse, senna-docusate, traMADol   Vital Signs    Vitals:   01/18/22 2129 01/19/22 0043 01/19/22 0450 01/19/22 0822  BP:  111/78 (!) 166/96 (!) 168/97  Pulse:  71 72 68  Resp: 14 19 18 14   Temp:  98.3 F (36.8 C) 97.9 F (36.6 C) 97.9 F (36.6 C)  TempSrc:  Oral Oral   SpO2:  100% 99% 100%  Weight:      Height:        Intake/Output Summary (Last 24 hours) at 01/19/2022 0832 Last data filed at 01/18/2022 1410 Gross per 24 hour  Intake 480 ml  Output --  Net 480 ml   Filed Weights   01/17/22 2005  Weight: 72.5 kg    Telemetry    SR - Personally Reviewed  ECG    No new tracings - Personally Reviewed  Physical Exam   GEN: No acute distress.   Neck: No JVD. Cardiac: RRR, III/VI systolic murmur RUSB, no rubs, or gallops.  Respiratory: Clear to auscultation bilaterally.  GI: Soft, nontender, non-distended.   MS: No edema; No deformity. Neuro:  Alert and oriented x 3; Nonfocal.  Psych: Normal affect.  Labs    Chemistry Recent Labs  Lab 01/17/22 1545 01/18/22 0418 01/18/22 1635  NA 138 138 137  K 3.9 3.2* 5.0  CL 100 105 102  CO2 26 29 26   GLUCOSE 109* 91 90  BUN 26* 27* 23  CREATININE 1.12* 1.58* 1.41*  CALCIUM 9.9 9.2 9.0  GFRNONAA 55* 37* 42*  ANIONGAP 12 4* 9     Hematology Recent Labs  Lab 01/17/22 1532 01/18/22 0418  WBC 9.1 8.9  RBC 4.79 4.47  HGB 13.1  12.4  HCT 40.9 37.0  MCV 85.4 82.8  MCH 27.3 27.7  MCHC 32.0 33.5  RDW 12.3 12.3  PLT 297 228    Cardiac EnzymesNo results for input(s): "TROPONINI" in the last 168 hours. No results for input(s): "TROPIPOC" in the last 168 hours.   BNPNo results for input(s): "BNP", "PROBNP" in the last 168 hours.   DDimer No results for input(s): "DDIMER" in the last 168 hours.   Radiology    03/19/22 Carotid Bilateral  Result Date: 01/18/2022 IMPRESSION: Color duplex indicates minimal homogeneous plaque, with no hemodynamically significant stenosis by duplex criteria in the extracranial cerebrovascular circulation. Signed, [place]. 03/20/2022, [facility] Electronically Signed   By: Miachel Roux D.O.   On: 01/18/2022 16:01   EEG adult  Result Date: 01/18/2022 IMPRESSION: This study is within normal limits. No seizures or epileptiform discharges were seen throughout the recording. A normal interictal EEG does not exclude nor support the diagnosis of epilepsy. 03/20/2022   MR BRAIN WO CONTRAST  Result Date: 01/17/2022 IMPRESSION: No acute intracranial process. No etiology is seen for the patient's near syncopal episodes.  No evidence of acute or subacute infarct. Electronically Signed   By: Wiliam Ke M.D.   On: 01/17/2022 23:41   DG Chest Port 1 View  Result Date: 01/17/2022 IMPRESSION: No active disease. Electronically Signed   By: Jasmine Pang M.D.   On: 01/17/2022 20:14    Cardiac Studies   2D echo 12/25/2021: 1. Left ventricular ejection fraction, by estimation, is 60 to 65%. The  left ventricle has normal function. The left ventricle has no regional  wall motion abnormalities. There is moderate asymmetric left ventricular  hypertrophy of the basal-septal  segment. Left ventricular diastolic parameters are consistent with Grade I  diastolic dysfunction (impaired relaxation).   2. Right ventricular systolic function is  normal. The right ventricular  size is normal.   3. The mitral valve is normal in structure. Mild mitral valve  regurgitation. No evidence of mitral stenosis.   4. The aortic valve has moderate calcification. Aortic valve  regurgitation is not visualized. Moderate aortic valve stenosis by mean  gradient measures 23.2 mmHg. Aortic valve Vmax measures 3.22 m/s. Severe  stenosis by aortic valve area, by VTI measures  0.68 cm sq   5. There is borderline dilatation of the aortic root, measuring 37 mm.  There is moderate dilatation of the ascending aorta, measuring 45 mm.   6. The inferior vena cava is normal in size with greater than 50%  respiratory variability, suggesting right atrial pressure of 3 mmHg.  Patient Profile     61 y.o. female with history of bicuspid aortic valve with aortic stenosis, thoracic aortic aneurysm, renal artery stenosis s/p left renal artery stenting in 03/2021, HTN, HLD, and DM2 who we are seeing for syncope at the request of Dr. Sedalia Muta.   Assessment & Plan    1. Hypertensive urgency with RAS: -Blood pressure remains labile, likely in the setting of her moderate to severe aortic stenosis with plans for moving forward with repair as outlined below -Most recent renal artery ultrasound showed a patent renal artery stent with no significant restenosis  -Goal BP, for now, 150-160 mmHg systolic in the supine state -Continue Coreg and irbesartan   2. Near syncope/bicuspid aortic valve with moderate to severe aortic stenosis/ascending thoracic aortic aneurysm: -Felt to be related to a combination of severe orthostatic hypotension in the setting of moderate to severe aortic stenosis and ascending aortic aneurysm  -High sensitivity troponin normal -It has been felt there is indication to proceed with valve replacement/ascending aortic aneurysm repair -She has an appointment with Dr. Laneta Simmers this upcoming week with plans to proceed with diagnostic [facility] thereafter with Dr.  Kirke Corin  3. HLD: -LDL 57 in 09/2021 -PTA Crestor   4. AKI: -Improving  5. Hypokalemia: -Improved with hemolysis noted      For questions or updates, please contact [facility] Please consult [email or web address] for contact info under Cardiology/STEMI.    Signed, Eula Listen, PA-C [facility] Pager: [phone] 01/19/2022, 8:32 AM

## 2022-01-19 NOTE — Discharge Instructions (Signed)
Keep your scheduled appt with Dr Garen Grams next week

## 2022-01-23 ENCOUNTER — Encounter: Payer: Self-pay | Admitting: Surgery

## 2022-01-23 ENCOUNTER — Ambulatory Visit (INDEPENDENT_AMBULATORY_CARE_PROVIDER_SITE_OTHER): Payer: 59 | Admitting: Surgery

## 2022-01-23 ENCOUNTER — Other Ambulatory Visit: Payer: Self-pay | Admitting: *Deleted

## 2022-01-23 ENCOUNTER — Encounter: Payer: Self-pay | Admitting: *Deleted

## 2022-01-23 VITALS — BP 116/82 | HR 91 | Resp 18 | Ht 66.0 in | Wt 159.0 lb

## 2022-01-23 DIAGNOSIS — I7121 Aneurysm of the ascending aorta, without rupture: Secondary | ICD-10-CM

## 2022-01-23 DIAGNOSIS — I350 Nonrheumatic aortic (valve) stenosis: Secondary | ICD-10-CM

## 2022-01-23 NOTE — Progress Notes (Signed)
HPI:  This 61 year old woman returns for follow-up of moderate bicuspid aortic valve stenosis with a 5 cm fusiform ascending aortic aneurysm.  When I first saw her on 01/01/2021 she had class II symptoms of exertional fatigue and shortness of breath.  I recommended aortic valve replacement and supra coronary replacement of her ascending aorta at that time but she was following up with Dr. Mariah Milling for next week and wanted to discuss it further with him.  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  She has developed worsening exertional shortness of breath and fatigue as well as episodes of dizziness with minimal exertion.  She had a follow-up echocardiogram on 12/25/2021 that showed a mean gradient of 23 mmHg and a peak gradient of 41 mmHg with a valve area of 0.68 cm.  Her mean gradient 1 year ago was 31 mmHg.  Her stroke-volume index has decreased to 26 with a left ventricular ejection fraction of 60 to 65%.  She has moderate asymmetric LVH of the basal-septal segment.  She is scheduled for cardiac catheterization next Monday.  Current Outpatient Medications  Medication Sig Dispense Refill   ALPRAZolam (XANAX) 0.25 MG tablet Take 1 tablet (0.25 mg total) by mouth 2 (two) times daily as needed. 60 tablet 5   aspirin EC 81 MG tablet Take 1 tablet (81 mg total) by mouth daily. Swallow whole.     carvedilol (COREG) 25 MG tablet Take one tablet  by mouth 2 (two) times daily with a meal. 180 tablet 1   estradiol (ESTRACE) 0.1 MG/GM vaginal cream INSERT 1/4 APPLICATORFUL VAGINALLY TWO TIMES A WEEK AS DIRECTED 42.5 g 5   fluticasone (FLONASE) 50 MCG/ACT nasal spray PLACE 2 SPRAYS INTO BOTH NOSTRILS DAILY. 16 g 2   Menthol, Topical Analgesic, (BIOFREEZE EX) Apply 1 application topically at bedtime. Applied to neck area     rosuvastatin (CRESTOR) 5 MG tablet Take 1 tablet (5 mg total) by mouth at bedtime. 90 tablet 2   sodium fluoride (SF 5000 PLUS) 1.1 % CREA dental cream Brush  teeth thoroughly for 2 minutes, do not rinse afterwards. 51 g 12   telmisartan (MICARDIS) 80 MG tablet Take 1 tablet (80 mg total) by mouth daily. 90 tablet 1   traMADol (ULTRAM) 50 MG tablet TAKE 2 TABLETS (100 MG TOTAL) BY MOUTH EVERY 6 (SIX) HOURS AS NEEDED. 240 tablet 5   amLODipine (NORVASC) 5 MG tablet Take 1 tablet (5 mg total) by mouth daily. (Patient not taking: Reported on 01/23/2022) 30 tablet 1   hydrALAZINE (APRESOLINE) 25 MG tablet Take 1 tablet (25 mg total) by mouth 3 (three) times daily. (Patient not taking: Reported on 01/23/2022) 270 tablet 3   HYDROcodone-acetaminophen (NORCO) 10-325 MG tablet Take 1 tablet by mouth every 6 (six) hours as needed for severe pain. (Patient not taking: Reported on 01/23/2022) 30 tablet 0   No current facility-administered medications for this visit.     Physical Exam: BP 116/82 (BP Location: Left Arm, Patient Position: Standing)   Pulse 91   Resp 18   Ht 5\' 6"  (1.676 m)   Wt 159 lb (72.1 kg)   LMP  (LMP Unknown)   SpO2 98% Comment: RA  BMI 25.66 kg/m  She looks well. Cardiac exam shows regular rate and rhythm with a 3/6 systolic murmur along the right sternal border.  There is no diastolic murmur. Lungs are clear. There is no peripheral edema.  Diagnostic Tests:  ECHOCARDIOGRAM REPORT         Patient Name:   Courtney Lopez Date of Exam: 12/25/2021  Medical Rec #:  [id number]            Height:       66.0 in  Accession #:    [id number]           Weight:       166.4 lb  Date of Birth:  1960/12/08             BSA:          1.849 m  Patient Age:    62 years             BP:           140/84 mmHg  Patient Gender: F                    HR:           74 bpm.  Exam Location:  [place]   Procedure: 2D Echo, Cardiac Doppler, Color Doppler and Intracardiac             Opacification Agent   Indications:    I35.0 Nonrheumatic aortic (valve) stenosis     History:        Patient has prior history of Echocardiogram examinations,  most                   recent 01/03/2021. Dilated aorta, RAS, Aortic Valve  Disease,                  Signs/Symptoms:Murmur, Chest Pain and  Dizziness/Lightheadedness;                  Risk Factors:Hypertension, Diabetes, Dyslipidemia and                  Non-Smoker.     Sonographer:    Pilar Jarvis RDMS, RVT, RDCS  Referring Phys: [place]     1. Left ventricular ejection fraction, by estimation, is 60 to 65%. The  left ventricle has normal function. The left ventricle has no regional  wall motion abnormalities. There is moderate asymmetric left ventricular  hypertrophy of the basal-septal  segment. Left ventricular diastolic parameters are consistent with Grade I  diastolic dysfunction (impaired relaxation).   2. Right ventricular systolic function is normal. The right ventricular  size is normal.   3. The mitral valve is normal in structure. Mild mitral valve  regurgitation. No evidence of mitral stenosis.   4. The aortic valve has moderate calcification. Aortic valve  regurgitation is not visualized. Moderate aortic valve stenosis by mean  gradient measures 23.2 mmHg. Aortic valve Vmax measures 3.22 m/s. Severe  stenosis by aortic valve area, by VTI measures  0.68 cm sq   5. There is borderline dilatation of the aortic root, measuring 37 mm.  There is moderate dilatation of the ascending aorta, measuring 45 mm.   6. The inferior vena cava is normal in size with greater than 50%  respiratory variability, suggesting right atrial pressure of 3 mmHg.   FINDINGS   Left Ventricle: Left ventricular ejection fraction, by estimation, is 60  to 65%. The left ventricle has normal function. The left ventricle has no  regional wall motion abnormalities. Definity contrast agent was given IV  to delineate the left ventricular   endocardial borders. The left ventricular internal cavity size was normal  in  size. There is moderate asymmetric left ventricular hypertrophy  of the  basal-septal segment. Left ventricular diastolic parameters are consistent  with Grade I diastolic  dysfunction (impaired relaxation).   Right Ventricle: The right ventricular size is normal. No increase in  right ventricular wall thickness. Right ventricular systolic function is  normal.   Left Atrium: Left atrial size was normal in size.   Right Atrium: Right atrial size was normal in size.   Pericardium: There is no evidence of pericardial effusion.   Mitral Valve: The mitral valve is normal in structure. Mild mitral annular  calcification. Mild mitral valve regurgitation. No evidence of mitral  valve stenosis.   Tricuspid Valve: The tricuspid valve is normal in structure. Tricuspid  valve regurgitation is mild . No evidence of tricuspid stenosis.   Aortic Valve: The aortic valve is normal in structure. Aortic valve  regurgitation is not visualized. Moderate aortic stenosis is present.  Aortic valve mean gradient measures 23.2 mmHg. Aortic valve peak gradient  measures 41.3 mmHg. Aortic valve area, by  VTI measures 0.68 cm.   Pulmonic Valve: The pulmonic valve was normal in structure. Pulmonic valve  regurgitation is not visualized. No evidence of pulmonic stenosis.   Aorta: The aortic root is normal in size and structure. There is  borderline dilatation of the aortic root, measuring 37 mm. There is  moderate dilatation of the ascending aorta, measuring 45 mm.   Venous: The inferior vena cava is normal in size with greater than 50%  respiratory variability, suggesting right atrial pressure of 3 mmHg.   IAS/Shunts: No atrial level shunt detected by color flow Doppler.      LEFT VENTRICLE  PLAX 2D  LVIDd:         3.70 cm   Diastology  LVIDs:         2.50 cm   LV e' medial:    3.49 cm/s  LV PW:         1.30 cm   LV E/e' medial:  21.6  LV IVS:        1.50 cm   LV e' lateral:   3.89 cm/s  LVOT diam:     1.90 cm   LV E/e' lateral: 19.4  LV SV:         48  LV SV  Index:   26  LVOT Area:     2.84 cm      RIGHT VENTRICLE  RV Basal diam:  3.50 cm  RV S prime:     11.10 cm/s  TAPSE (M-mode): 2.5 cm   LEFT ATRIUM             Index        RIGHT ATRIUM           Index  LA diam:        3.40 cm 1.84 cm/m   RA Area:     13.30 cm  LA Vol (A2C):   57.8 ml 31.25 ml/m  RA Volume:   31.50 ml  17.03 ml/m  LA Vol (A4C):   39.6 ml 21.41 ml/m  LA Biplane Vol: 48.9 ml 26.44 ml/m   AORTIC VALVE                     PULMONIC VALVE  AV Area (Vmax):    0.77 cm      PV Vmax:       0.88 m/s  AV Area (Vmean):   0.72 cm  PV Peak grad:  3.1 mmHg  AV Area (VTI):     0.68 cm  AV Vmax:           321.50 cm/s  AV Vmean:          233.000 cm/s  AV VTI:            0.711 m  AV Peak Grad:      41.3 mmHg  AV Mean Grad:      23.2 mmHg  LVOT Vmax:         86.80 cm/s  LVOT Vmean:        59.400 cm/s  LVOT VTI:          0.170 m  LVOT/AV VTI ratio: 0.24     AORTA  Ao Root diam: 3.70 cm  Ao Asc diam:  4.65 cm  Ao Arch diam: 3.0 cm   MITRAL VALVE                TRICUSPID VALVE  MV Area (PHT): 1.97 cm     TR Peak grad:   33.2 mmHg  MV Decel Time: 385 msec     TR Vmax:        288.00 cm/s  MV E velocity: 75.40 cm/s  MV A velocity: 120.00 cm/s  SHUNTS  MV E/A ratio:  0.63         Systemic VTI:  0.17 m                              Systemic Diam: 1.90 cm   Julien Nordmann MD  Electronically signed by Julien Nordmann MD  Signature Date/Time: 12/25/2021/1:00:11 PM         Final    Narrative & Impression  CLINICAL DATA:  Thoracic aortic aneurysm   EXAM: CT ANGIOGRAPHY CHEST WITH CONTRAST   TECHNIQUE: Multidetector CT imaging of the chest was performed using the standard protocol during bolus administration of intravenous contrast. Multiplanar CT image reconstructions and MIPs were obtained to evaluate the vascular anatomy.   RADIATION DOSE REDUCTION: This exam was performed according to the departmental dose-optimization program which includes  automated exposure control, adjustment of the mA and/or kV according to patient size and/or use of iterative reconstruction technique.   CONTRAST:  76mL OMNIPAQUE IOHEXOL 350 MG/ML SOLN   COMPARISON:  04/05/2021   FINDINGS: Cardiovascular: 4.8 cm ascending thoracic aortic aneurysm is again identified, without significant change since prior exam. Normal caliber of the aortic arch and descending thoracic aorta. No evidence of dissection.   The heart is unremarkable without pericardial effusion.   Mediastinum/Nodes: Stable appearance of the thyroid, trachea, and esophagus. No pathologic mediastinal or hilar adenopathy.   Lungs/Pleura: No acute airspace disease, effusion, or pneumothorax. Central airways are widely patent.   Upper Abdomen: Chronic left renal atrophy. Stable hepatic steatosis.   Musculoskeletal: No acute or destructive bony lesions. Reconstructed images demonstrate no additional findings.   Review of the MIP images confirms the above findings.   IMPRESSION: 1. Stable 4.8 cm ascending thoracic aortic aneurysm. Ascending thoracic aortic aneurysm. Recommend semi-annual imaging followup by CTA or MRA and referral to cardiothoracic surgery if not already obtained. This recommendation follows 2010 ACCF/AHA/AATS/ACR/ASA/SCA/SCAI/SIR/STS/SVM Guidelines for the Diagnosis and Management of Patients With Thoracic Aortic Disease. Circulation. 2010; 121: N829-F621. Aortic aneurysm NOS (ICD10-I71.9) 2. Otherwise no acute intrathoracic process.     Electronically Signed   By: Sharlet Salina M.D.   On: 12/28/2021  08:56     Impression:  This 61 year old woman has stage D3, paradoxical low gradient/low flow severe bicuspid aortic stenosis with NYHA class III symptoms of exertional fatigue and shortness of breath as well as episodic dizziness.  This is associated with a stable 4.8 cm fusiform ascending aortic aneurysm.  I agree it is time to proceed with aortic valve  replacement and replacement of her ascending aorta.  I would have to make a decision about supra coronary replacement or Bentall procedure at the time of surgery.  I have recommended using a bioprosthetic valve given her age.  This will require a period of circulatory arrest for the distal anastomosis at the aortic arch.  I discussed the operative procedure with the patient  including alternatives, benefits and risks; including but not limited to bleeding, blood transfusion, infection, stroke, myocardial infarction, graft failure, heart block requiring a permanent pacemaker, organ dysfunction, and death.  Courtney Lopez understands and agrees to proceed.    Plan:  She will have cardiac catheterization on Monday and if her coronary arteries are normal we will plan to proceed with aortic valve replacement and replacement of the ascending aorta, possible Bentall procedure on 02/14/2022.   Gaye Pollack, MD [facility] [phone]

## 2022-01-24 NOTE — Telephone Encounter (Addendum)
Per Dr. Kirke Corin patient will need 3 full hours of hydration. Claris Che with scheduling and made her aware. Pre-procedure instructions sent to the patient through mychart. Adv the pt that once reviewed she is to contact our office if any questions.

## 2022-01-25 ENCOUNTER — Other Ambulatory Visit: Payer: Self-pay

## 2022-01-25 NOTE — Telephone Encounter (Signed)
Patient given verbal pre-procedure instructions with verbalized understanding.

## 2022-01-26 DIAGNOSIS — R002 Palpitations: Secondary | ICD-10-CM | POA: Diagnosis not present

## 2022-01-28 ENCOUNTER — Ambulatory Visit
Admission: RE | Admit: 2022-01-28 | Discharge: 2022-01-28 | Disposition: A | Discharge status: Home / Self Care | Payer: 59 | Attending: Cardiovascular Disease | Admitting: Cardiovascular Disease

## 2022-01-28 ENCOUNTER — Encounter: Payer: Self-pay | Admitting: Cardiovascular Disease

## 2022-01-28 ENCOUNTER — Encounter
Admission: RE | Disposition: A | Discharge status: Home / Self Care | Payer: Self-pay | Source: Home / Self Care | Attending: Cardiovascular Disease

## 2022-01-28 DIAGNOSIS — E785 Hyperlipidemia, unspecified: Secondary | ICD-10-CM | POA: Insufficient documentation

## 2022-01-28 DIAGNOSIS — I350 Nonrheumatic aortic (valve) stenosis: Secondary | ICD-10-CM | POA: Insufficient documentation

## 2022-01-28 DIAGNOSIS — Z7985 Long-term (current) use of injectable non-insulin antidiabetic drugs: Secondary | ICD-10-CM | POA: Insufficient documentation

## 2022-01-28 DIAGNOSIS — E876 Hypokalemia: Secondary | ICD-10-CM | POA: Diagnosis not present

## 2022-01-28 DIAGNOSIS — I7121 Aneurysm of the ascending aorta, without rupture: Secondary | ICD-10-CM | POA: Diagnosis not present

## 2022-01-28 DIAGNOSIS — Z7984 Long term (current) use of oral hypoglycemic drugs: Secondary | ICD-10-CM | POA: Insufficient documentation

## 2022-01-28 DIAGNOSIS — Z79899 Other long term (current) drug therapy: Secondary | ICD-10-CM | POA: Diagnosis not present

## 2022-01-28 DIAGNOSIS — I10 Essential (primary) hypertension: Secondary | ICD-10-CM | POA: Insufficient documentation

## 2022-01-28 DIAGNOSIS — N179 Acute kidney failure, unspecified: Secondary | ICD-10-CM | POA: Diagnosis not present

## 2022-01-28 DIAGNOSIS — I2510 Atherosclerotic heart disease of native coronary artery without angina pectoris: Secondary | ICD-10-CM | POA: Diagnosis not present

## 2022-01-28 DIAGNOSIS — E119 Type 2 diabetes mellitus without complications: Secondary | ICD-10-CM | POA: Insufficient documentation

## 2022-01-28 DIAGNOSIS — I701 Atherosclerosis of renal artery: Secondary | ICD-10-CM | POA: Diagnosis not present

## 2022-01-28 HISTORY — PX: RIGHT/LEFT HEART CATH AND CORONARY ANGIOGRAPHY: CATH118266

## 2022-01-28 SURGERY — RIGHT/LEFT HEART CATH AND CORONARY ANGIOGRAPHY
Anesthesia: Moderate Sedation | Laterality: Bilateral

## 2022-01-28 MED ORDER — LIDOCAINE HCL 1 % IJ SOLN
INTRAMUSCULAR | Status: AC
  Filled 2022-01-28: qty 20

## 2022-01-28 MED ORDER — SODIUM CHLORIDE 0.9% FLUSH: 3.0000 mL | INTRAVENOUS | Status: DC | PRN

## 2022-01-28 MED ORDER — HEPARIN (PORCINE) IN NACL 1000-0.9 UT/500ML-% IV SOLN
INTRAVENOUS | Status: AC
  Filled 2022-01-28: qty 1000

## 2022-01-28 MED ORDER — VERAPAMIL HCL 2.5 MG/ML IV SOLN
INTRAVENOUS | Status: DC | PRN
  Administered 2022-01-28: 2.5 mg via INTRA_ARTERIAL

## 2022-01-28 MED ORDER — SODIUM CHLORIDE 0.9 % IV SOLN: 250.0000 mL | INTRAVENOUS | Status: DC | PRN

## 2022-01-28 MED ORDER — LIDOCAINE HCL (PF) 1 % IJ SOLN
INTRAMUSCULAR | Status: DC | PRN
  Administered 2022-01-28: 4 mL

## 2022-01-28 MED ORDER — VERAPAMIL HCL 2.5 MG/ML IV SOLN
INTRAVENOUS | Status: AC
  Filled 2022-01-28: qty 2

## 2022-01-28 MED ORDER — SODIUM CHLORIDE 0.9 % WEIGHT BASED INFUSION: 1.0000 mL/kg/h | INTRAVENOUS | Status: DC

## 2022-01-28 MED ORDER — SODIUM CHLORIDE 0.9 % WEIGHT BASED INFUSION
3.0000 mL/kg/h | INTRAVENOUS | Status: AC
  Administered 2022-01-28: 3 mL/kg/h via INTRAVENOUS

## 2022-01-28 MED ORDER — FENTANYL CITRATE (PF) 100 MCG/2ML IJ SOLN
INTRAMUSCULAR | Status: DC | PRN
  Administered 2022-01-28: 25 ug via INTRAVENOUS
  Administered 2022-01-28: 50 ug via INTRAVENOUS

## 2022-01-28 MED ORDER — ONDANSETRON HCL 4 MG/2ML IJ SOLN: 4.0000 mg | Freq: Four times a day (QID) | INTRAMUSCULAR | Status: DC | PRN

## 2022-01-28 MED ORDER — SODIUM CHLORIDE 0.9% FLUSH: 3.0000 mL | Freq: Two times a day (BID) | INTRAVENOUS | Status: DC

## 2022-01-28 MED ORDER — MIDAZOLAM HCL 2 MG/2ML IJ SOLN
INTRAMUSCULAR | Status: AC
  Filled 2022-01-28: qty 2

## 2022-01-28 MED ORDER — ASPIRIN 81 MG PO CHEW: 81.0000 mg | CHEWABLE_TABLET | ORAL | Status: DC

## 2022-01-28 MED ORDER — FENTANYL CITRATE (PF) 100 MCG/2ML IJ SOLN
INTRAMUSCULAR | Status: AC
  Filled 2022-01-28: qty 2

## 2022-01-28 MED ORDER — HEPARIN SODIUM (PORCINE) 1000 UNIT/ML IJ SOLN
INTRAMUSCULAR | Status: DC | PRN
  Administered 2022-01-28: 3500 [IU] via INTRAVENOUS

## 2022-01-28 MED ORDER — HEPARIN SODIUM (PORCINE) 1000 UNIT/ML IJ SOLN
INTRAMUSCULAR | Status: AC
  Filled 2022-01-28: qty 10

## 2022-01-28 MED ORDER — SODIUM CHLORIDE 0.9 % IV SOLN: INTRAVENOUS | Status: DC

## 2022-01-28 MED ORDER — LABETALOL HCL 5 MG/ML IV SOLN: 10.0000 mg | INTRAVENOUS | Status: DC | PRN

## 2022-01-28 MED ORDER — LABETALOL HCL 5 MG/ML IV SOLN
INTRAVENOUS | Status: AC
  Filled 2022-01-28: qty 4

## 2022-01-28 MED ORDER — ACETAMINOPHEN 325 MG PO TABS: 650.0000 mg | ORAL_TABLET | ORAL | Status: DC | PRN

## 2022-01-28 MED ORDER — MIDAZOLAM HCL 2 MG/2ML IJ SOLN
INTRAMUSCULAR | Status: DC | PRN
  Administered 2022-01-28: 1 mg via INTRAVENOUS

## 2022-01-28 MED ORDER — LABETALOL HCL 5 MG/ML IV SOLN
INTRAVENOUS | Status: DC | PRN
  Administered 2022-01-28 (×2): 10 mg via INTRAVENOUS

## 2022-01-28 MED ORDER — HEPARIN (PORCINE) IN NACL 1000-0.9 UT/500ML-% IV SOLN
INTRAVENOUS | Status: DC | PRN
  Administered 2022-01-28: 1000 mL

## 2022-01-28 MED ORDER — IOHEXOL 300 MG/ML  SOLN
INTRAMUSCULAR | Status: DC | PRN
  Administered 2022-01-28: 52 mL

## 2022-01-28 SURGICAL SUPPLY — 15 items
BAND ZEPHYR COMPRESS 30 LONG (HEMOSTASIS) IMPLANT
CATH BALLN WEDGE 5F 110CM (CATHETERS) IMPLANT
CATH INFINITI 5FR JK (CATHETERS) IMPLANT
CATH INFINITI JR4 5F (CATHETERS) IMPLANT
DRAPE BRACHIAL (DRAPES) IMPLANT
GLIDESHEATH SLEND SS 6F .021 (SHEATH) IMPLANT
GUIDEWIRE ANGLED .035 180CM (WIRE) IMPLANT
GUIDEWIRE INQWIRE 1.5J.035X260 (WIRE) IMPLANT
INQWIRE 1.5J .035X260CM (WIRE) ×1
PACK CARDIAC CATH (CUSTOM PROCEDURE TRAY) ×1 IMPLANT
PROTECTION STATION PRESSURIZED (MISCELLANEOUS) ×1
SET ATX SIMPLICITY (MISCELLANEOUS) IMPLANT
SHEATH GLIDE SLENDER 4/5FR (SHEATH) IMPLANT
STATION PROTECTION PRESSURIZED (MISCELLANEOUS) IMPLANT
WIRE HITORQ VERSACORE ST 145CM (WIRE) IMPLANT

## 2022-01-28 NOTE — Interval H&P Note (Signed)
History and Physical Interval Note:  01/28/2022 10:56 AM  Courtney Lopez  has presented today for surgery, with the diagnosis of R and L Cath   Aortic valve stenosis PT NEEDS 3 HRS PRE HYDRATION.  The various methods of treatment have been discussed with the patient and family. After consideration of risks, benefits and other options for treatment, the patient has consented to  Procedure(s): RIGHT/LEFT HEART CATH AND CORONARY ANGIOGRAPHY (Bilateral) as a surgical intervention.  The patient's history has been reviewed, patient examined, no change in status, stable for surgery.  I have reviewed the patient's chart and labs.  Questions were answered to the patient's satisfaction.     Kathlyn [place]

## 2022-01-30 ENCOUNTER — Telehealth: Payer: Self-pay

## 2022-01-30 NOTE — Telephone Encounter (Signed)
FMLA form completed and faxed to [employer] @[phone]. Beginning LOA 01/17/22 (per [employer]) through approx 05/14/22/ Surgery is scheduled for 02/14/22.

## 2022-01-31 ENCOUNTER — Other Ambulatory Visit: Payer: Self-pay

## 2022-01-31 MED ORDER — AMOXICILLIN 500 MG PO CAPS
ORAL_CAPSULE | ORAL | 0 refills | Status: DC
  Filled 2022-01-31: qty 12, 3d supply, fill #0

## 2022-01-31 MED ORDER — SODIUM FLUORIDE 1.1 % DT CREA
TOPICAL_CREAM | DENTAL | 12 refills | Status: AC
  Filled 2022-01-31: qty 51, 30d supply, fill #0
  Filled 2022-05-09: qty 51, 30d supply, fill #1
  Filled 2022-10-15: qty 51, 30d supply, fill #2
  Filled 2023-01-08: qty 51, 30d supply, fill #3

## 2022-02-11 ENCOUNTER — Other Ambulatory Visit: Payer: Self-pay

## 2022-02-11 ENCOUNTER — Encounter: Payer: Self-pay | Admitting: Internal Medicine

## 2022-02-11 ENCOUNTER — Other Ambulatory Visit: Payer: Self-pay | Admitting: Internal Medicine

## 2022-02-11 MED FILL — Tramadol HCl Tab 50 MG: ORAL | 30 days supply | Qty: 240 | Fill #1 | Status: AC

## 2022-02-11 NOTE — Pre-Procedure Instructions (Signed)
Surgical Instructions    Your procedure is scheduled on Thursday, October 5th.  Report to [facility] Main Entrance "A" at 5:30 A.M., then check in with the Admitting office.  Call this number if you have problems the morning of surgery:  [phone]   If you have any questions prior to your surgery date call [phone]: Open Monday-Friday 8am-4pm    Remember:  Do not eat or drink after midnight the night before your surgery    Take these medicines the morning of surgery with A SIP OF WATER  carvedilol (COREG)    Take these medications as needed: ALPRAZolam Duanne Moron)  traMADol (ULTRAM)  triamcinolone (NASACORT ALLERGY 24HR)   Follow your surgeon's instructions on when to stop Aspirin.  If no instructions were given by your surgeon then you will need to call the office to get those instructions.    As of today, STOP taking any Aspirin (unless otherwise instructed by your surgeon) Aleve, Naproxen, Ibuprofen, Motrin, Advil, Goody's, BC's, all herbal medications, fish oil, and all vitamins.                     Do NOT Smoke (Tobacco/Vaping) for 24 hours prior to your procedure.  If you use a CPAP at night, you may bring your mask/headgear for your overnight stay.   Contacts, glasses, piercing's, hearing aid's, dentures or partials may not be worn into surgery, please bring cases for these belongings.    For patients admitted to the hospital, discharge time will be determined by your treatment team.   Patients discharged the day of surgery will not be allowed to drive home, and someone needs to stay with them for 24 hours.  SURGICAL WAITING ROOM VISITATION Patients having surgery or a procedure may have no more than 2 support people in the waiting area - these visitors may rotate.   Children under the age of 79 must have an adult with them who is not the patient. If the patient needs to stay at the hospital during part of their recovery, the visitor guidelines for inpatient rooms  apply. Pre-op nurse will coordinate an appropriate time for 1 support person to accompany patient in pre-op.  This support person may not rotate.   Please refer to the [facility] website for the visitor guidelines for Inpatients (after your surgery is over and you are in a regular room).    Special instructions:   [place] [place]- Preparing For Surgery  Before surgery, you can play an important role. Because skin is not sterile, your skin needs to be as free of germs as possible. You can reduce the number of germs on your skin by washing with CHG (chlorahexidine gluconate) Soap before surgery.  CHG is an antiseptic cleaner which kills germs and bonds with the skin to continue killing germs even after washing.    Oral Hygiene is also important to reduce your risk of infection.  Remember - BRUSH YOUR TEETH THE MORNING OF SURGERY WITH YOUR REGULAR TOOTHPASTE  Please do not use if you have an allergy to CHG or antibacterial soaps. If your skin becomes reddened/irritated stop using the CHG.  Do not shave (including legs and underarms) for at least 48 hours prior to first CHG shower. It is OK to shave your face.  Please follow these instructions carefully.   Shower the NIGHT BEFORE SURGERY and the MORNING OF SURGERY  If you chose to wash your hair, wash your hair first as usual with your normal shampoo.  After you shampoo, rinse your hair and body thoroughly to remove the shampoo.  Use CHG Soap as you would any other liquid soap. You can apply CHG directly to the skin and wash gently with a scrungie or a clean washcloth.   Apply the CHG Soap to your body ONLY FROM THE NECK DOWN.  Do not use on open wounds or open sores. Avoid contact with your eyes, ears, mouth and genitals (private parts). Wash Face and genitals (private parts)  with your normal soap.   Wash thoroughly, paying special attention to the area where your surgery will be performed.  Thoroughly rinse your body with warm water from the  neck down.  DO NOT shower/wash with your normal soap after using and rinsing off the CHG Soap.  Pat yourself dry with a CLEAN TOWEL.  Wear CLEAN PAJAMAS to bed the night before surgery  Place CLEAN SHEETS on your bed the night before your surgery  DO NOT SLEEP WITH PETS.   Day of Surgery: Take a shower with CHG soap. Do not wear jewelry or makeup Do not wear lotions, powders, perfumes, or deodorant. Do not shave 48 hours prior to surgery.  Do not bring valuables to the hospital.  [facility] is not responsible for any belongings or valuables. Do not wear nail polish, gel polish, artificial nails, or any other type of covering on natural nails (fingers and toes) If you have artificial nails or gel coating that need to be removed by a nail salon, please have this removed prior to surgery. Artificial nails or gel coating may interfere with anesthesia's ability to adequately monitor your vital signs. Wear Clean/Comfortable clothing the morning of surgery Remember to brush your teeth WITH YOUR REGULAR TOOTHPASTE.   Please read over the following fact sheets that you were given.    If you received a COVID test during your pre-op visit  it is requested that you wear a mask when out in public, stay away from anyone that may not be feeling well and notify your surgeon if you develop symptoms. If you have been in contact with anyone that has tested positive in the last 10 days please notify you surgeon.

## 2022-02-12 ENCOUNTER — Encounter (HOSPITAL_COMMUNITY): Payer: Self-pay

## 2022-02-12 ENCOUNTER — Other Ambulatory Visit: Payer: Self-pay

## 2022-02-12 ENCOUNTER — Ambulatory Visit (HOSPITAL_COMMUNITY)
Admission: RE | Admit: 2022-02-12 | Discharge: 2022-02-12 | Disposition: A | Discharge status: Home / Self Care | Payer: 59 | Source: Ambulatory Visit | Attending: Surgery | Admitting: Surgery

## 2022-02-12 ENCOUNTER — Ambulatory Visit (HOSPITAL_BASED_OUTPATIENT_CLINIC_OR_DEPARTMENT_OTHER)
Admission: RE | Admit: 2022-02-12 | Discharge: 2022-02-12 | Disposition: A | Discharge status: Home / Self Care | Payer: 59 | Source: Ambulatory Visit | Attending: Surgery | Admitting: Surgery

## 2022-02-12 ENCOUNTER — Encounter (HOSPITAL_COMMUNITY)
Admission: RE | Admit: 2022-02-12 | Discharge: 2022-02-12 | Disposition: A | Discharge status: Home / Self Care | Payer: 59 | Source: Ambulatory Visit | Attending: Surgery | Admitting: Surgery

## 2022-02-12 ENCOUNTER — Ambulatory Visit: Payer: 59 | Admitting: Cardiology

## 2022-02-12 VITALS — BP 163/102 | HR 70 | Temp 98.0°F | Resp 17 | Ht 66.0 in | Wt 165.4 lb

## 2022-02-12 DIAGNOSIS — I350 Nonrheumatic aortic (valve) stenosis: Secondary | ICD-10-CM | POA: Insufficient documentation

## 2022-02-12 DIAGNOSIS — I10 Essential (primary) hypertension: Secondary | ICD-10-CM | POA: Diagnosis not present

## 2022-02-12 DIAGNOSIS — Z1152 Encounter for screening for COVID-19: Secondary | ICD-10-CM | POA: Insufficient documentation

## 2022-02-12 DIAGNOSIS — Z9889 Other specified postprocedural states: Secondary | ICD-10-CM | POA: Diagnosis not present

## 2022-02-12 DIAGNOSIS — I7121 Aneurysm of the ascending aorta, without rupture: Secondary | ICD-10-CM | POA: Insufficient documentation

## 2022-02-12 DIAGNOSIS — I712 Thoracic aortic aneurysm, without rupture, unspecified: Secondary | ICD-10-CM | POA: Diagnosis not present

## 2022-02-12 DIAGNOSIS — E119 Type 2 diabetes mellitus without complications: Secondary | ICD-10-CM | POA: Insufficient documentation

## 2022-02-12 DIAGNOSIS — E8770 Fluid overload, unspecified: Secondary | ICD-10-CM | POA: Diagnosis not present

## 2022-02-12 DIAGNOSIS — I083 Combined rheumatic disorders of mitral, aortic and tricuspid valves: Secondary | ICD-10-CM | POA: Diagnosis not present

## 2022-02-12 DIAGNOSIS — I701 Atherosclerosis of renal artery: Secondary | ICD-10-CM | POA: Diagnosis not present

## 2022-02-12 DIAGNOSIS — I7123 Aneurysm of the descending thoracic aorta, without rupture: Secondary | ICD-10-CM | POA: Diagnosis not present

## 2022-02-12 DIAGNOSIS — Z79899 Other long term (current) drug therapy: Secondary | ICD-10-CM | POA: Diagnosis not present

## 2022-02-12 DIAGNOSIS — Z4682 Encounter for fitting and adjustment of non-vascular catheter: Secondary | ICD-10-CM | POA: Diagnosis not present

## 2022-02-12 DIAGNOSIS — Z952 Presence of prosthetic heart valve: Secondary | ICD-10-CM | POA: Diagnosis not present

## 2022-02-12 DIAGNOSIS — Z833 Family history of diabetes mellitus: Secondary | ICD-10-CM | POA: Diagnosis not present

## 2022-02-12 DIAGNOSIS — I719 Aortic aneurysm of unspecified site, without rupture: Secondary | ICD-10-CM | POA: Diagnosis not present

## 2022-02-12 DIAGNOSIS — J9811 Atelectasis: Secondary | ICD-10-CM | POA: Diagnosis not present

## 2022-02-12 DIAGNOSIS — I2510 Atherosclerotic heart disease of native coronary artery without angina pectoris: Secondary | ICD-10-CM

## 2022-02-12 DIAGNOSIS — Z01818 Encounter for other preprocedural examination: Secondary | ICD-10-CM | POA: Insufficient documentation

## 2022-02-12 DIAGNOSIS — I517 Cardiomegaly: Secondary | ICD-10-CM | POA: Diagnosis not present

## 2022-02-12 DIAGNOSIS — I714 Abdominal aortic aneurysm, without rupture, unspecified: Secondary | ICD-10-CM | POA: Diagnosis not present

## 2022-02-12 DIAGNOSIS — E11649 Type 2 diabetes mellitus with hypoglycemia without coma: Secondary | ICD-10-CM | POA: Diagnosis not present

## 2022-02-12 DIAGNOSIS — I358 Other nonrheumatic aortic valve disorders: Secondary | ICD-10-CM | POA: Diagnosis not present

## 2022-02-12 DIAGNOSIS — D62 Acute posthemorrhagic anemia: Secondary | ICD-10-CM | POA: Diagnosis not present

## 2022-02-12 DIAGNOSIS — Z79890 Hormone replacement therapy (postmenopausal): Secondary | ICD-10-CM | POA: Diagnosis not present

## 2022-02-12 DIAGNOSIS — Z806 Family history of leukemia: Secondary | ICD-10-CM | POA: Diagnosis not present

## 2022-02-12 DIAGNOSIS — R918 Other nonspecific abnormal finding of lung field: Secondary | ICD-10-CM | POA: Diagnosis not present

## 2022-02-12 DIAGNOSIS — Z803 Family history of malignant neoplasm of breast: Secondary | ICD-10-CM | POA: Diagnosis not present

## 2022-02-12 DIAGNOSIS — Z7984 Long term (current) use of oral hypoglycemic drugs: Secondary | ICD-10-CM | POA: Diagnosis not present

## 2022-02-12 DIAGNOSIS — D72829 Elevated white blood cell count, unspecified: Secondary | ICD-10-CM | POA: Diagnosis not present

## 2022-02-12 DIAGNOSIS — Z794 Long term (current) use of insulin: Secondary | ICD-10-CM | POA: Diagnosis not present

## 2022-02-12 DIAGNOSIS — Z90710 Acquired absence of both cervix and uterus: Secondary | ICD-10-CM | POA: Diagnosis not present

## 2022-02-12 DIAGNOSIS — Q231 Congenital insufficiency of aortic valve: Secondary | ICD-10-CM | POA: Diagnosis not present

## 2022-02-12 DIAGNOSIS — Z888 Allergy status to other drugs, medicaments and biological substances status: Secondary | ICD-10-CM | POA: Diagnosis not present

## 2022-02-12 DIAGNOSIS — Z808 Family history of malignant neoplasm of other organs or systems: Secondary | ICD-10-CM | POA: Diagnosis not present

## 2022-02-12 LAB — COMPREHENSIVE METABOLIC PANEL
ALT: 21 U/L (ref 0–44)
AST: 25 U/L (ref 15–41)
Albumin: 3.8 g/dL (ref 3.5–5.0)
Alkaline Phosphatase: 83 U/L (ref 38–126)
Anion gap: 11 (ref 5–15)
BUN: 20 mg/dL (ref 8–23)
CO2: 23 mmol/L (ref 22–32)
Calcium: 8.9 mg/dL (ref 8.9–10.3)
Chloride: 102 mmol/L (ref 98–111)
Creatinine, Ser: 1.04 mg/dL — ABNORMAL HIGH (ref 0.44–1.00)
GFR, Estimated: >60 mL/min (ref >60)
Glucose, Bld: 133 mg/dL — ABNORMAL HIGH (ref 70–99)
Potassium: 3.9 mmol/L (ref 3.5–5.1)
Sodium: 136 mmol/L (ref 135–145)
Total Bilirubin: 0.7 mg/dL (ref 0.3–1.2)
Total Protein: 6.8 g/dL (ref 6.5–8.1)

## 2022-02-12 LAB — CBC
HCT: 38.1 % (ref 36.0–46.0)
Hemoglobin: 12.5 g/dL (ref 12.0–15.0)
MCH: 28.3 pg (ref 26.0–34.0)
MCHC: 32.8 g/dL (ref 30.0–36.0)
MCV: 86.4 fL (ref 80.0–100.0)
Platelets: 301 10*3/uL (ref 150–400)
RBC: 4.41 MIL/uL (ref 3.87–5.11)
RDW: 12.7 % (ref 11.5–15.5)
WBC: 8.2 10*3/uL (ref 4.0–10.5)
nRBC: 0 % (ref 0.0–0.2)

## 2022-02-12 LAB — BLOOD GAS, ARTERIAL
Acid-Base Excess: 3.3 mmol/L — ABNORMAL HIGH (ref 0.0–2.0)
Bicarbonate: 27.8 mmol/L (ref 20.0–28.0)
Drawn by: 58793
O2 Saturation: 99 %
Patient temperature: 37
pCO2 arterial: 41 mmHg (ref 32–48)
pH, Arterial: 7.44 (ref 7.35–7.45)
pO2, Arterial: 114 mmHg — ABNORMAL HIGH (ref 83–108)

## 2022-02-12 LAB — URINALYSIS, ROUTINE W REFLEX MICROSCOPIC
Bilirubin Urine: NEGATIVE
Glucose, UA: NEGATIVE mg/dL
Hgb urine dipstick: NEGATIVE
Ketones, ur: NEGATIVE mg/dL
Leukocytes,Ua: NEGATIVE
Nitrite: NEGATIVE
Protein, ur: NEGATIVE mg/dL
Specific Gravity, Urine: 1.006 (ref 1.005–1.030)
pH: 6 (ref 5.0–8.0)

## 2022-02-12 LAB — SARS CORONAVIRUS 2 (TAT 6-24 HRS): SARS Coronavirus 2: NEGATIVE

## 2022-02-12 LAB — HEMOGLOBIN A1C
Hgb A1c MFr Bld: 5.4 % (ref 4.8–5.6)
Mean Plasma Glucose: 108.28 mg/dL

## 2022-02-12 LAB — SURGICAL PCR SCREEN
MRSA, PCR: NEGATIVE
Staphylococcus aureus: NEGATIVE

## 2022-02-12 LAB — GLUCOSE, CAPILLARY: Glucose-Capillary: 118 mg/dL — ABNORMAL HIGH (ref 70–99)

## 2022-02-12 LAB — PROTIME-INR
INR: 1 (ref 0.8–1.2)
Prothrombin Time: 12.6 seconds (ref 11.4–15.2)

## 2022-02-12 LAB — APTT: aPTT: 33 seconds (ref 24–36)

## 2022-02-12 MED FILL — Semaglutide Soln Pen-inj 1 MG/DOSE (4 MG/3ML): SUBCUTANEOUS | 28 days supply | Qty: 3 | Fill #0 | Status: AC

## 2022-02-12 NOTE — Progress Notes (Signed)
PCP - Dr. Deborra Medina Cardiologist - Dr. Marlyne Beards  PPM/ICD - n/a  Chest x-ray - 02/12/22 EKG - 01/21/22 Stress Test - denies ECHO - 12/25/21 Cardiac Cath - 01/28/22  Sleep Study - denies CPAP - denies  Fasting Blood Sugar - 87-115 Checks Blood Sugar 1 time a day CBG at PAT 118  Blood Thinner Instructions: n/a Aspirin Instructions: Per pt, she is to continue per TCTS.  NPO at MD  COVID TEST- 02/12/22, done in PAT.   Anesthesia review: cardiac hx.   Patient denies shortness of breath, fever, cough and chest pain at PAT appointment   All instructions explained to the patient, with a verbal understanding of the material. Patient agrees to go over the instructions while at home for a better understanding. Patient also instructed to self quarantine after being tested for COVID-19. The opportunity to ask questions was provided.

## 2022-02-13 ENCOUNTER — Other Ambulatory Visit (HOSPITAL_COMMUNITY): Payer: 59

## 2022-02-13 MED ORDER — TRANEXAMIC ACID 1000 MG/10ML IV SOLN
1.5000 mg/kg/h | INTRAVENOUS | Status: AC
  Administered 2022-02-14: 1.5 mg/kg/h via INTRAVENOUS
  Filled 2022-02-13: qty 25

## 2022-02-13 MED ORDER — VANCOMYCIN HCL 1250 MG/250ML IV SOLN
1250.0000 mg | INTRAVENOUS | Status: AC
  Administered 2022-02-14: 1250 mg via INTRAVENOUS
  Filled 2022-02-13: qty 250

## 2022-02-13 MED ORDER — TRANEXAMIC ACID (OHS) BOLUS VIA INFUSION
15.0000 mg/kg | INTRAVENOUS | Status: AC
  Administered 2022-02-14: 984 mg via INTRAVENOUS
  Filled 2022-02-13: qty 984

## 2022-02-13 MED ORDER — MANNITOL 20 % IV SOLN
INTRAVENOUS | Status: DC
  Filled 2022-02-13: qty 13

## 2022-02-13 MED ORDER — TRANEXAMIC ACID (OHS) PUMP PRIME SOLUTION
2.0000 mg/kg | INTRAVENOUS | Status: DC
  Filled 2022-02-13: qty 1.5

## 2022-02-13 MED ORDER — OZEMPIC (0.25 OR 0.5 MG/DOSE) 2 MG/3ML ~~LOC~~ SOPN
0.2500 mg | PEN_INJECTOR | SUBCUTANEOUS | 2 refills | Status: DC
  Filled 2022-02-13: qty 3, 28d supply, fill #0

## 2022-02-13 MED ORDER — CEFAZOLIN SODIUM-DEXTROSE 2-4 GM/100ML-% IV SOLN
2.0000 g | INTRAVENOUS | Status: AC
  Administered 2022-02-14: 2 g via INTRAVENOUS
  Filled 2022-02-13: qty 100

## 2022-02-13 MED ORDER — DEXMEDETOMIDINE HCL IN NACL 400 MCG/100ML IV SOLN
0.1000 ug/kg/h | INTRAVENOUS | Status: AC
  Administered 2022-02-14: .3 ug/kg/h via INTRAVENOUS
  Filled 2022-02-13: qty 100

## 2022-02-13 MED ORDER — INSULIN REGULAR(HUMAN) IN NACL 100-0.9 UT/100ML-% IV SOLN
INTRAVENOUS | Status: AC
  Administered 2022-02-14: 2.2 [IU]/h via INTRAVENOUS
  Filled 2022-02-13: qty 100

## 2022-02-13 MED ORDER — EPINEPHRINE HCL 5 MG/250ML IV SOLN IN NS
0.0000 ug/min | INTRAVENOUS | Status: DC
  Filled 2022-02-13: qty 250

## 2022-02-13 MED ORDER — MILRINONE LACTATE IN DEXTROSE 20-5 MG/100ML-% IV SOLN
0.3000 ug/kg/min | INTRAVENOUS | Status: DC
  Filled 2022-02-13: qty 100

## 2022-02-13 MED ORDER — PHENYLEPHRINE HCL-NACL 20-0.9 MG/250ML-% IV SOLN
30.0000 ug/min | INTRAVENOUS | Status: AC
  Administered 2022-02-14: 40 ug/min via INTRAVENOUS
  Filled 2022-02-13: qty 250

## 2022-02-13 MED ORDER — HEPARIN 30,000 UNITS/1000 ML (OHS) CELLSAVER SOLUTION
Status: DC
  Filled 2022-02-13: qty 1000

## 2022-02-13 MED ORDER — NOREPINEPHRINE 4 MG/250ML-% IV SOLN
0.0000 ug/min | INTRAVENOUS | Status: DC
  Filled 2022-02-13: qty 250

## 2022-02-13 MED ORDER — METFORMIN HCL ER 750 MG PO TB24
750.0000 mg | ORAL_TABLET | Freq: Every day | ORAL | 1 refills | Status: DC
  Filled 2022-02-13 – 2022-05-09 (×2): qty 90, 90d supply, fill #0
  Filled 2022-08-08: qty 90, 90d supply, fill #1

## 2022-02-13 MED ORDER — PLASMA-LYTE A IV SOLN
INTRAVENOUS | Status: DC
  Filled 2022-02-13: qty 2.5

## 2022-02-13 MED ORDER — POTASSIUM CHLORIDE 2 MEQ/ML IV SOLN
80.0000 meq | INTRAVENOUS | Status: DC
  Filled 2022-02-13: qty 40

## 2022-02-13 NOTE — Anesthesia Preprocedure Evaluation (Addendum)
Anesthesia Evaluation  Patient identified by MRN, date of birth, ID band Patient awake    Reviewed: Allergy & Precautions, H&P , NPO status , Patient's Chart, lab work & pertinent test results, reviewed documented beta blocker date and time   Airway Mallampati: II  TM Distance: >3 FB Neck ROM: Full    Dental no notable dental hx. (+) Teeth Intact, Dental Advisory Given   Pulmonary neg pulmonary ROS,    Pulmonary exam normal breath sounds clear to auscultation       Cardiovascular Exercise Tolerance: Good hypertension, Pt. on medications and Pt. on home beta blockers + CAD and + Peripheral Vascular Disease  + Valvular Problems/Murmurs AS  Rhythm:Regular Rate:Normal + Systolic murmurs    Neuro/Psych Anxiety negative neurological ROS     GI/Hepatic Neg liver ROS, hiatal hernia,   Endo/Other  diabetes, Insulin Dependent  Renal/GU Renal InsufficiencyRenal disease  negative genitourinary   Musculoskeletal  (+) Arthritis , Osteoarthritis,    Abdominal   Peds  Hematology negative hematology ROS (+)   Anesthesia Other Findings   Reproductive/Obstetrics negative OB ROS                            Anesthesia Physical Anesthesia Plan  ASA: 4  Anesthesia Plan: General   Post-op Pain Management: Tylenol PO (pre-op)*   Induction: Intravenous  PONV Risk Score and Plan: 3 and Midazolam, Ondansetron and Treatment may vary due to age or medical condition  Airway Management Planned: Oral ETT  Additional Equipment: Arterial line, CVP, PA Cath, TEE and Ultrasound Guidance Line Placement  Intra-op Plan:   Post-operative Plan: Post-operative intubation/ventilation  Informed Consent: I have reviewed the patients History and Physical, chart, labs and discussed the procedure including the risks, benefits and alternatives for the proposed anesthesia with the patient or authorized representative who has  indicated his/her understanding and acceptance.     Dental advisory given  Plan Discussed with: CRNA  Anesthesia Plan Comments:        Anesthesia Quick Evaluation

## 2022-02-13 NOTE — H&P (Signed)
Courtney Lopez       [place] [address]             [id number]      Cardiothoracic Surgery Admission History and Physical   PCP is Courtney Mc, MD Referring Provider is Courtney Mc, MD       Chief Complaint  Patient presents with   Thoracic Aortic Aneurysm      Initial surgical consult, CTA chest 8/9      HPI:   The patient is a 61 year old woman with a history of hypertension, diabetes, renal artery stenosis felt to be secondary to fibromuscular dysplasia, bicuspid aortic valve disease, and an ascending aortic aneurysm that has been followed with periodic echocardiogram and CTA chest.  Her echocardiogram in August 2021 was read as showing a trileaflet aortic valve with moderate aortic stenosis with a mean gradient of 22.5 mmHg.  Aortic valve area by VTI was 1.09 cm with a dimensionless index of 0.32.  Left ventricular ejection fraction was 60 to 65% with mild LVH and grade 1 diastolic dysfunction. CTA of the chest on 12/19/2020 showed a stable fusiform ascending aortic aneurysm with maximum diameter of 5 cm.  The previous scan in 2020 showed the diameter to be 4.9 cm.  When I first saw her on 01/01/2021 she had class II symptoms of exertional fatigue and shortness of breath.  I recommended aortic valve replacement and supra coronary replacement of her ascending aorta at that time but she was following up with Dr. Rockey Lopez for next week and wanted to discuss it further with him.  She has continued to work as an AD at [employer].  She has developed worsening exertional shortness of breath and fatigue as well as episodes of dizziness with minimal exertion.  She had a follow-up echocardiogram on 12/25/2021 that showed a mean gradient of 23 mmHg and a peak gradient of 41 mmHg with a valve area of 0.68 cm.  Her mean gradient a year ago was 31 mmHg.  Her stroke-volume index has decreased to 26 with a left ventricular ejection fraction of 60 to 65%.   She has moderate asymmetric LVH of the basal-septal segment.     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  She is married and lives with her husband.  She reports that over the past year she has developed exertional fatigue and shortness of breath.  She has found that after she works 3 days in a row that she is completely exhausted and has to spend the fourth day laying around not doing anything.  She has had some dizziness but no syncope. She has had lower extremity edema.  She denies PND or orthopnea. She denies any chest pain or pressure.       Past Medical History:  Diagnosis Date   Ankle fracture 12/31/2018    left   Anxiety     Arthritis      neck, back, ankle, knees   Ascending aortic aneurysm ([facility])      a. 04/2016 CTA: 4.6cm Asc TAA; b. 04/2018 CTA: 4.8cm; c. 10/2018 CTA: 4.9x4.8cm; d. 12/2019 Echo: 4.8 cm.   Bicuspid aortic valve     Breast screening, unspecified     Carotid arterial disease ([facility])      a. 01/2020 U/S: 1-39% bilat ICA stenosis. No evidence of FMD.   Cervicalgia      cervical stenosis  Diabetes mellitus without complication (HCC)     Elevated coronary artery calcium score      a. 03/2016 Cardiac CT: Ca2+ = 78 (90th %'ile).   Fibromuscular dysplasia (HCC)     Heart murmur     History of hiatal hernia     Metatarsal fracture 2007    5th, from wearing high heels   Moderate aortic stenosis      a. 12/2019 Echo: EF 60-65%, no rwma, mild LVH, gr1 DD, nl RV size/fxn, triv MR, mild AI, mod AS, mod-sev dil of Asc Ao - 78mm.  AoV reported as tricuspid but prev known to be bicuspid.   Multiple thyroid nodules      a. 10/2019 U/S: R mid thyroid nodule - f/u 5 yrs.   Other symptoms involving abdomen and pelvis(789.9)     Renal artery stenosis ([facility] [place])      a. 12/2019 Renal duplex: Nl RRA. LRA >60% stenosis.   Renal cyst, right      a. 12/2019 Duplex: 5.8 x 5.7 cm R renal cyst in lower pole.   Retinal hemorrhage     Symptomatic  menopausal or female climacteric states     Unspecified essential hypertension     Wears contact lenses             Past Surgical History:  Procedure Laterality Date   ABDOMINAL HYSTERECTOMY       ANKLE ARTHROSCOPY WITH RECONSTRUCTION Left 08/24/2020    Procedure: LEFT ANKLE ARTHROSCOPIC DEBRIDEMENT, EXTENSIVE, ARTHOSCROPIC TREATMENT OF TALUS OSTEOCHONDRAL LESION, LATERAL LIGAMENT RECONSTRUCTION;  Surgeon: Courtney Crocker, MD;  Location: [facility];  Service: Orthopedics;  Laterality: Left;   APPENDECTOMY        done during oophorectomy   CESAREAN SECTION        x 2   ETHMOIDECTOMY Left 03/02/2020    Procedure: ETHMOIDECTOMY;  Surgeon: Courtney Sheffield, MD;  Location: [facility];  Service: ENT;  Laterality: Left;   gyn surgery        hysterectomy- done for adenomysosis and endometriosis   IMAGE GUIDED SINUS SURGERY N/A 03/02/2020    Procedure: IMAGE GUIDED SINUS SURGERY;  Surgeon: Courtney Sheffield, MD;  Location: [facility];  Service: ENT;  Laterality: N/A;  need disk PLACED DISK ON OR CHARGE NURSE DESK 9-17 KP   IMAGE GUIDED SINUS SURGERY Left 04/13/2020    Procedure: IMAGE GUIDED SINUS SURGERY;  Surgeon: Courtney Sheffield, MD;  Location: [facility];  Service: ENT;  Laterality: Left;  needs stryker disk per Courtney Lopez use same disk as before (October) put on charge nurses desk. DS 04/03/20   MAXILLARY ANTROSTOMY Left 03/02/2020    Procedure: MAXILLARY ANTROSTOMY;  Surgeon: Courtney Sheffield, MD;  Location: [facility];  Service: ENT;  Laterality: Left;   MAXILLARY ANTROSTOMY Left 04/13/2020    Procedure: MAXILLECTOMY;  Surgeon: Courtney Sheffield, MD;  Location: [facility];  Service: ENT;  Laterality: Left;   OOPHORECTOMY        unilateral, due to ruptured ovarian cysts   OSTEOTOMY MAXILLARY        due to bite problems   TUBAL LIGATION               Family History  Problem Relation Age of Onset   Diabetes Mother     Aortic stenosis Mother      Cancer Father          leukemia   Retinoblastoma Son     Melanoma Son  Cancer Maternal Aunt          breast   Breast cancer Maternal Aunt 3   Cancer Grandchild 1        retinoblastoma   Cancer Granddaughter        Social History Social History         Tobacco Use   Smoking status: Never   Smokeless tobacco: Never  Vaping Use   Vaping Use: Never used  Substance Use Topics   Alcohol use: Yes      Comment: rare   Drug use: No            Current Outpatient Medications  Medication Sig Dispense Refill   ALPRAZolam (XANAX) 0.25 MG tablet TAKE 1 TABLET BY MOUTH TWICE DAILY AS NEEDED 60 tablet 5   carvedilol (COREG) 25 MG tablet Take one tablet  by mouth 2 (two) times daily with a meal. 180 tablet 1   estradiol (ESTRACE) 0.1 MG/GM vaginal cream INSERT 1/4 APPLICATORFUL VAGINALLY TWO TIMES A WEEK AS DIRECTED 42.5 g 5   fluticasone (FLONASE) 50 MCG/ACT nasal spray PLACE 2 SPRAYS INTO BOTH NOSTRILS DAILY. 16 g 2   fluticasone (FLONASE) 50 MCG/ACT nasal spray PLACE 2 SPRAYS INTO BOTH NOSTRILS DAILY. 16 g 2   losartan (COZAAR) 100 MG tablet TAKE 1 TABLET (100 MG TOTAL) BY MOUTH DAILY. 90 tablet 3   Menthol, Topical Analgesic, (BIOFREEZE EX) Apply topically.       metFORMIN (GLUCOPHAGE-XR) 750 MG 24 hr tablet Take 1 tablet by mouth daily with breakfast. 90 tablet 0   rosuvastatin (CRESTOR) 5 MG tablet Take 1 tablet (5 mg total) by mouth at bedtime. 90 tablet 2   sodium fluoride (PREVIDENT 5000 PLUS) 1.1 % CREA dental cream Brush teeth thoroughly for 2 minutes, do not rinse afterwards. 51 g 10   traMADol (ULTRAM) 50 MG tablet TAKE 2 TABLETS (100 MG TOTAL) BY MOUTH EVERY 6 (SIX) HOURS AS NEEDED. 240 tablet 5   hydrALAZINE (APRESOLINE) 10 MG tablet Take 1 tablet (10 mg total) by mouth 3 (three) times daily. 270 tablet 3    No current facility-administered medications for this visit.           Allergies  Allergen Reactions   Hctz [Hydrochlorothiazide] Other (See Comments)       Decreased gfr   Prozac [Fluoxetine Hcl] Palpitations      Review of Systems  Constitutional:  Positive for fatigue. Negative for chills and fever.  HENT: Negative.         Last saw her dentist 4 months ago.  Eyes: Negative.   Respiratory:  Positive for shortness of breath.   Cardiovascular:  Positive for leg swelling. Negative for chest pain and palpitations.  Gastrointestinal: Negative.   Endocrine: Negative.   Genitourinary: Negative.   Musculoskeletal:  Positive for arthralgias.  Skin: Negative.   Allergic/Immunologic: Negative.   Neurological:  Positive for dizziness. Negative for syncope.  Hematological: Negative.   Psychiatric/Behavioral:  The patient is nervous/anxious.     BP (!) 177/99 (BP Location: Right Arm, Patient Position: Sitting)   Pulse 94   Resp 20   Ht 5' 6.5" (1.689 m)   Wt 182 lb (82.6 kg)   LMP  (LMP Unknown)   SpO2 97% Comment: RA  BMI 28.94 kg/m  Physical Exam Constitutional:      Appearance: Normal appearance. She is normal weight.  HENT:     Head: Normocephalic and atraumatic.  Eyes:     Extraocular Movements: Extraocular  movements intact.     Conjunctiva/sclera: Conjunctivae normal.     Pupils: Pupils are equal, round, and reactive to light.  Cardiovascular:     Rate and Rhythm: Normal rate and regular rhythm.     Pulses: Normal pulses.     Heart sounds: Murmur heard.     Comments: 3/6 systolic murmur RSB. No diastolic murmur Pulmonary:     Effort: Pulmonary effort is normal.     Breath sounds: Normal breath sounds.  Abdominal:     General: There is no distension.     Tenderness: There is no abdominal tenderness.  Musculoskeletal:        General: Normal range of motion.     Cervical back: Normal range of motion and neck supple.     No pedal edema Skin:    General: Skin is warm and dry.  Neurological:     General: No focal deficit present.     Mental Status: She is alert and oriented to person, place, and time.  Psychiatric:         Mood and Affect: Mood normal.        Behavior: Behavior normal.        Diagnostic Tests:        ECHOCARDIOGRAM REPORT         Patient Name:   Courtney Lopez Date of Exam: 12/25/2021  Medical Rec #:  [id number]            Height:       66.0 in  Accession #:    [id number]           Weight:       166.4 lb  Date of Birth:  [birth year]             BSA:          1.849 m  Patient Age:    28 years             BP:           140/84 mmHg  Patient Gender: F                    HR:           74 bpm.  Exam Location:  [facility]   Procedure: 2D Echo, Cardiac Doppler, Color Doppler and Intracardiac             Opacification Agent   Indications:    I35.0 Nonrheumatic aortic (valve) stenosis     History:        Patient has prior history of Echocardiogram examinations,  most                  recent 01/03/2021. Dilated aorta, RAS, Aortic Valve  Disease,                  Signs/Symptoms:Murmur, Chest Pain and  Dizziness/Lightheadedness;                  Risk Factors:Hypertension, Diabetes, Dyslipidemia and                  Non-Smoker.     Sonographer:    Pilar Jarvis RDMS, RVT, RDCS  Referring Phys: [place]     1. Left ventricular ejection fraction, by estimation, is 60 to 65%. The  left ventricle has normal function. The left ventricle has no regional  wall motion abnormalities. There is moderate asymmetric left ventricular  hypertrophy of the basal-septal  segment. Left ventricular diastolic parameters are consistent with Grade I  diastolic dysfunction (impaired relaxation).   2. Right ventricular systolic function is normal. The right ventricular  size is normal.   3. The mitral valve is normal in structure. Mild mitral valve  regurgitation. No evidence of mitral stenosis.   4. The aortic valve has moderate calcification. Aortic valve  regurgitation is not visualized. Moderate aortic valve stenosis by mean  gradient measures 23.2 mmHg. Aortic valve Vmax  measures 3.22 m/s. Severe  stenosis by aortic valve area, by VTI measures  0.68 cm sq   5. There is borderline dilatation of the aortic root, measuring 37 mm.  There is moderate dilatation of the ascending aorta, measuring 45 mm.   6. The inferior vena cava is normal in size with greater than 50%  respiratory variability, suggesting right atrial pressure of 3 mmHg.   FINDINGS   Left Ventricle: Left ventricular ejection fraction, by estimation, is 60  to 65%. The left ventricle has normal function. The left ventricle has no  regional wall motion abnormalities. Definity contrast agent was given IV  to delineate the left ventricular   endocardial borders. The left ventricular internal cavity size was normal  in size. There is moderate asymmetric left ventricular hypertrophy of the  basal-septal segment. Left ventricular diastolic parameters are consistent  with Grade I diastolic  dysfunction (impaired relaxation).   Right Ventricle: The right ventricular size is normal. No increase in  right ventricular wall thickness. Right ventricular systolic function is  normal.   Left Atrium: Left atrial size was normal in size.   Right Atrium: Right atrial size was normal in size.   Pericardium: There is no evidence of pericardial effusion.   Mitral Valve: The mitral valve is normal in structure. Mild mitral annular  calcification. Mild mitral valve regurgitation. No evidence of mitral  valve stenosis.   Tricuspid Valve: The tricuspid valve is normal in structure. Tricuspid  valve regurgitation is mild . No evidence of tricuspid stenosis.   Aortic Valve: The aortic valve is normal in structure. Aortic valve  regurgitation is not visualized. Moderate aortic stenosis is present.  Aortic valve mean gradient measures 23.2 mmHg. Aortic valve peak gradient  measures 41.3 mmHg. Aortic valve area, by  VTI measures 0.68 cm.   Pulmonic Valve: The pulmonic valve was normal in structure. Pulmonic  valve  regurgitation is not visualized. No evidence of pulmonic stenosis.   Aorta: The aortic root is normal in size and structure. There is  borderline dilatation of the aortic root, measuring 37 mm. There is  moderate dilatation of the ascending aorta, measuring 45 mm.   Venous: The inferior vena cava is normal in size with greater than 50%  respiratory variability, suggesting right atrial pressure of 3 mmHg.   IAS/Shunts: No atrial level shunt detected by color flow Doppler.      LEFT VENTRICLE  PLAX 2D  LVIDd:         3.70 cm   Diastology  LVIDs:         2.50 cm   LV e' medial:    3.49 cm/s  LV PW:         1.30 cm   LV E/e' medial:  21.6  LV IVS:        1.50 cm   LV e' lateral:   3.89 cm/s  LVOT diam:     1.90 cm   LV E/e' lateral: 19.4  LV  SV:         48  LV SV Index:   26  LVOT Area:     2.84 cm      RIGHT VENTRICLE  RV Basal diam:  3.50 cm  RV S prime:     11.10 cm/s  TAPSE (M-mode): 2.5 cm   LEFT ATRIUM             Index        RIGHT ATRIUM           Index  LA diam:        3.40 cm 1.84 cm/m   RA Area:     13.30 cm  LA Vol (A2C):   57.8 ml 31.25 ml/m  RA Volume:   31.50 ml  17.03 ml/m  LA Vol (A4C):   39.6 ml 21.41 ml/m  LA Biplane Vol: 48.9 ml 26.44 ml/m   AORTIC VALVE                     PULMONIC VALVE  AV Area (Vmax):    0.77 cm      PV Vmax:       0.88 m/s  AV Area (Vmean):   0.72 cm      PV Peak grad:  3.1 mmHg  AV Area (VTI):     0.68 cm  AV Vmax:           321.50 cm/s  AV Vmean:          233.000 cm/s  AV VTI:            0.711 m  AV Peak Grad:      41.3 mmHg  AV Mean Grad:      23.2 mmHg  LVOT Vmax:         86.80 cm/s  LVOT Vmean:        59.400 cm/s  LVOT VTI:          0.170 m  LVOT/AV VTI ratio: 0.24     AORTA  Ao Root diam: 3.70 cm  Ao Asc diam:  4.65 cm  Ao Arch diam: 3.0 cm   MITRAL VALVE                TRICUSPID VALVE  MV Area (PHT): 1.97 cm     TR Peak grad:   33.2 mmHg  MV Decel Time: 385 msec     TR Vmax:        288.00 cm/s  MV  E velocity: 75.40 cm/s  MV A velocity: 120.00 cm/s  SHUNTS  MV E/A ratio:  0.63         Systemic VTI:  0.17 m                              Systemic Diam: 1.90 cm   Ida Rogue MD  Electronically signed by Ida Rogue MD  Signature Date/Time: 12/25/2021/1:00:11 PM         Final      Physicians  Panel Physicians Referring Physician Case Authorizing Physician  Wellington Hampshire, MD (Primary)     Procedures  RIGHT/LEFT HEART CATH AND CORONARY ANGIOGRAPHY   Conclusion  1.  Minimal nonobstructive coronary artery disease.  The left main is very short with possible 2 separate ostia for the LAD and left circumflex.  The right coronary artery could not be selectively engaged due to significant tortuosity of the innominate artery and  ascending aortic aneurysm.  The vessel appears patent with no obstructive disease in the proximal to distal segment but PL branches were not well visualized. 2.  Right and left cardiac catheterization showed normal right and left-sided filling pressures, high normal pulmonary pressure and normal cardiac output. 3.  The aortic valve could not be crossed due to significant tortuosity of the innominate artery and inability to torque the catheters.  Aortic stenosis appears to be severe by echo.   Recommendations: Proceed with aortic valve replacement and ascending aorta repair.     Indications  Aortic valve stenosis, etiology of cardiac valve disease unspecified [I35.0 (ICD-10-CM)]   Procedural Details  Technical Details Procedural Details: The right antecubital area was prepped in a sterile fashion.  It was anesthetized with 1% lidocaine.  Ultrasound was used to identify the right brachial vein.  Under direct ultrasound visualization, a 4 French slender sheath was used to obtain access.  Ultrasound was also used for right radial artery access and placement of a 6 French slender sheath. 2.5 mg of verapamil was administered through the sheath, weight-based  unfractionated heparin was administered intravenously. Right heart catheterization was performed using a 5 French Swan-Ganz catheter. Cardiac output was calculated by the Fick method. A Jackie catheter was used for selective coronary angiography.  A JR 4 catheter was used to nonselectively engage the right coronary artery.  Engaging the coronary arteries was overall difficult due to significant tortuosity of the innominate artery and aneurysmal dilatation of the ascending aorta.  I could not cross the aortic valve for the same reason.  I had significant difficulty torquing the catheters with kinking of the catheter at the hinge point.   Catheter exchanges were performed over an exchange length guidewire. There were no immediate procedural complications. A TR band was used for radial hemostasis at the completion of the procedure.  The patient was transferred to the post catheterization recovery area for further monitoring.    Estimated blood loss <50 mL.   During this procedure medications were administered to achieve and maintain moderate conscious sedation while the patient's heart rate, blood pressure, and oxygen saturation were continuously monitored and I was present face-to-face 100% of this time.   Medications (Filter: Administrations occurring from 1042 to 1206 on 01/28/22)  fentaNYL (SUBLIMAZE) injection (mcg) Total dose:  75 mcg  Date/Time Rate/Dose/Volume Action   01/28/22 1056 25 mcg Given   1138 50 mcg Given    midazolam (VERSED) injection (mg) Total dose:  1 mg  Date/Time Rate/Dose/Volume Action   01/28/22 1056 1 mg Given    Heparin (Porcine) in NaCl 1000-0.9 UT/500ML-% SOLN (mL) Total volume:  1,000 mL  Date/Time Rate/Dose/Volume Action   01/28/22 1101 1,000 mL Given    lidocaine (PF) (XYLOCAINE) 1 % injection (mL) Total volume:  4 mL  Date/Time Rate/Dose/Volume Action   01/28/22 1101 4 mL Given    verapamil (ISOPTIN) injection (mg) Total dose:  2.5 mg  Date/Time  Rate/Dose/Volume Action   01/28/22 1108 2.5 mg Given    heparin sodium (porcine) injection (Units) Total dose:  3,500 Units  Date/Time Rate/Dose/Volume Action   01/28/22 1134 3,500 Units Given    labetalol (NORMODYNE) injection (mg) Total dose:  20 mg  Date/Time Rate/Dose/Volume Action   01/28/22 1147 10 mg Given   1159 10 mg Given    iohexol (OMNIPAQUE) 300 MG/ML solution (mL) Total volume:  52 mL  Date/Time Rate/Dose/Volume Action   01/28/22 1156 52 mL Given     Sedation Time  Sedation Time Physician-1: 55 minutes 7 seconds   Contrast  Medication Name Total Dose  iohexol (OMNIPAQUE) 300 MG/ML solution 52 mL    Radiation/Fluoro  Fluoro time: 12.6 (min) DAP: 16.2 (Gycm2) Cumulative Air Kerma: 181 (mGy)   Complications   Complications documented before study signed (01/28/2022 12:12 PM)    No complications were associated with this study.  Documented by Lanette Hampshire, RN - 01/28/2022 11:52 AM     Coronary Findings   Diagnostic Dominance: Co-dominant  Left Main  Vessel is angiographically normal.    Left Anterior Descending  The vessel exhibits minimal luminal irregularities.    Left Circumflex  Vessel is angiographically normal.    First Obtuse Marginal Branch  Vessel is small in size.    Second Obtuse Marginal Branch  Vessel is angiographically normal.    Third Obtuse Marginal Branch  Vessel is angiographically normal.    Left Posterior Atrioventricular Artery  Vessel is angiographically normal.    Right Coronary Artery  Vessel is angiographically normal.     Intervention    No interventions have been documented.   Coronary Diagrams   Diagnostic Dominance: Co-dominant    Intervention    Implants     No implant documentation for this case.   Syngo Images   Show images for CARDIAC CATHETERIZATION  Images on Long Term Storage   Show images for Rasheka, Loveless to Procedure Log  Procedure Log      Hemo Data  Flowsheet Row Most Recent Value  Fick Cardiac Output 5.09 L/min  Fick Cardiac Output Index 2.82 (L/min)/BSA  RA A Wave 8 mmHg  RA V Wave 5 mmHg  RA Mean 4 mmHg  RV Systolic Pressure 30 mmHg  RV Diastolic Pressure 2 mmHg  RV EDP 9 mmHg  PA Systolic Pressure 30 mmHg  PA Diastolic Pressure 12 mmHg  PA Mean 19 mmHg  PW A Wave 15 mmHg  PW V Wave 12 mmHg  PW Mean 12 mmHg  AO Systolic Pressure 192 mmHg  AO Diastolic Pressure 98 mmHg  AO Mean 135 mmHg  QP/QS 1  TPVR Index 6.75 HRUI      Narrative & Impression  CLINICAL DATA:  Thoracic aortic aneurysm   EXAM: CT ANGIOGRAPHY CHEST WITH CONTRAST   TECHNIQUE: Multidetector CT imaging of the chest was performed using the standard protocol during bolus administration of intravenous contrast. Multiplanar CT image reconstructions and MIPs were obtained to evaluate the vascular anatomy.   RADIATION DOSE REDUCTION: This exam was performed according to the departmental dose-optimization program which includes automated exposure control, adjustment of the mA and/or kV according to patient size and/or use of iterative reconstruction technique.   CONTRAST:  2mL OMNIPAQUE IOHEXOL 350 MG/ML SOLN   COMPARISON:  04/05/2021   FINDINGS: Cardiovascular: 4.8 cm ascending thoracic aortic aneurysm is again identified, without significant change since prior exam. Normal caliber of the aortic arch and descending thoracic aorta. No evidence of dissection.   The heart is unremarkable without pericardial effusion.   Mediastinum/Nodes: Stable appearance of the thyroid, trachea, and esophagus. No pathologic mediastinal or hilar adenopathy.   Lungs/Pleura: No acute airspace disease, effusion, or pneumothorax. Central airways are widely patent.   Upper Abdomen: Chronic left renal atrophy. Stable hepatic steatosis.   Musculoskeletal: No acute or destructive bony lesions. Reconstructed images demonstrate no additional findings.    Review of the MIP images confirms the above findings.   IMPRESSION: 1. Stable 4.8 cm ascending thoracic aortic aneurysm. Ascending  thoracic aortic aneurysm. Recommend semi-annual imaging followup by CTA or MRA and referral to cardiothoracic surgery if not already obtained. This recommendation follows 2010 ACCF/AHA/AATS/ACR/ASA/SCA/SCAI/SIR/STS/SVM Guidelines for the Diagnosis and Management of Patients With Thoracic Aortic Disease. Circulation. 2010; [id number]. Aortic aneurysm NOS (ICD10-I71.9) 2. Otherwise no acute intrathoracic process.     Electronically Signed   By: Randa Ngo M.D.   On: 12/28/2021 08:56       Impression:    This 61 year old woman has stage D3, paradoxical low gradient/low flow severe bicuspid aortic stenosis with NYHA class III symptoms of exertional fatigue and shortness of breath as well as episodic dizziness.  This is associated with a stable 4.8 cm fusiform ascending aortic aneurysm.  I agree it is time to proceed with aortic valve replacement and replacement of her ascending aorta.  I would have to make a decision about supra coronary replacement or Bentall procedure at the time of surgery.  I have recommended using a bioprosthetic valve given her age.  This will require a period of circulatory arrest for the distal anastomosis at the aortic arch.   I discussed the operative procedure with the patient  including alternatives, benefits and risks; including but not limited to bleeding, blood transfusion, infection, stroke, myocardial infarction, graft failure, heart block requiring a permanent pacemaker, organ dysfunction, and death.  Courtney Lopez understands and agrees to proceed.     Plan:   Aortic valve replacement and replacement of the ascending aorta, possible Bentall procedure using a bioprosthetic valve.   Gaye Pollack, MD [facility] [phone]

## 2022-02-14 ENCOUNTER — Inpatient Hospital Stay (HOSPITAL_COMMUNITY)
Admission: RE | Admit: 2022-02-14 | Discharge: 2022-02-20 | DRG: 220 | Disposition: A | Discharge status: Home / Self Care | Payer: 59 | Attending: Surgery | Admitting: Surgery

## 2022-02-14 ENCOUNTER — Inpatient Hospital Stay (HOSPITAL_COMMUNITY): Payer: 59

## 2022-02-14 ENCOUNTER — Inpatient Hospital Stay (HOSPITAL_COMMUNITY): Payer: 59 | Admitting: Anesthesiology

## 2022-02-14 ENCOUNTER — Encounter (HOSPITAL_COMMUNITY)
Admission: RE | Disposition: A | Discharge status: Home / Self Care | Payer: Self-pay | Source: Home / Self Care | Attending: Surgery

## 2022-02-14 ENCOUNTER — Encounter (HOSPITAL_COMMUNITY): Payer: Self-pay | Admitting: Surgery

## 2022-02-14 ENCOUNTER — Inpatient Hospital Stay (HOSPITAL_COMMUNITY): Payer: 59 | Admitting: Physician Assistant

## 2022-02-14 ENCOUNTER — Other Ambulatory Visit: Payer: Self-pay

## 2022-02-14 DIAGNOSIS — Z806 Family history of leukemia: Secondary | ICD-10-CM | POA: Diagnosis not present

## 2022-02-14 DIAGNOSIS — I7123 Aneurysm of the descending thoracic aorta, without rupture: Secondary | ICD-10-CM

## 2022-02-14 DIAGNOSIS — I7121 Aneurysm of the ascending aorta, without rupture: Secondary | ICD-10-CM | POA: Diagnosis present

## 2022-02-14 DIAGNOSIS — Z7984 Long term (current) use of oral hypoglycemic drugs: Secondary | ICD-10-CM

## 2022-02-14 DIAGNOSIS — E11649 Type 2 diabetes mellitus with hypoglycemia without coma: Secondary | ICD-10-CM | POA: Diagnosis not present

## 2022-02-14 DIAGNOSIS — Z79899 Other long term (current) drug therapy: Secondary | ICD-10-CM

## 2022-02-14 DIAGNOSIS — I350 Nonrheumatic aortic (valve) stenosis: Secondary | ICD-10-CM

## 2022-02-14 DIAGNOSIS — Z952 Presence of prosthetic heart valve: Principal | ICD-10-CM

## 2022-02-14 DIAGNOSIS — Z79890 Hormone replacement therapy (postmenopausal): Secondary | ICD-10-CM | POA: Diagnosis not present

## 2022-02-14 DIAGNOSIS — D72829 Elevated white blood cell count, unspecified: Secondary | ICD-10-CM | POA: Diagnosis not present

## 2022-02-14 DIAGNOSIS — I701 Atherosclerosis of renal artery: Secondary | ICD-10-CM | POA: Diagnosis present

## 2022-02-14 DIAGNOSIS — I358 Other nonrheumatic aortic valve disorders: Secondary | ICD-10-CM | POA: Diagnosis not present

## 2022-02-14 DIAGNOSIS — Z888 Allergy status to other drugs, medicaments and biological substances status: Secondary | ICD-10-CM

## 2022-02-14 DIAGNOSIS — I10 Essential (primary) hypertension: Secondary | ICD-10-CM | POA: Diagnosis present

## 2022-02-14 DIAGNOSIS — Q231 Congenital insufficiency of aortic valve: Principal | ICD-10-CM

## 2022-02-14 DIAGNOSIS — Z803 Family history of malignant neoplasm of breast: Secondary | ICD-10-CM

## 2022-02-14 DIAGNOSIS — I2510 Atherosclerotic heart disease of native coronary artery without angina pectoris: Secondary | ICD-10-CM | POA: Diagnosis present

## 2022-02-14 DIAGNOSIS — Z9889 Other specified postprocedural states: Secondary | ICD-10-CM | POA: Diagnosis not present

## 2022-02-14 DIAGNOSIS — R918 Other nonspecific abnormal finding of lung field: Secondary | ICD-10-CM | POA: Diagnosis not present

## 2022-02-14 DIAGNOSIS — I712 Thoracic aortic aneurysm, without rupture, unspecified: Secondary | ICD-10-CM | POA: Diagnosis not present

## 2022-02-14 DIAGNOSIS — Z808 Family history of malignant neoplasm of other organs or systems: Secondary | ICD-10-CM

## 2022-02-14 DIAGNOSIS — E119 Type 2 diabetes mellitus without complications: Secondary | ICD-10-CM | POA: Diagnosis not present

## 2022-02-14 DIAGNOSIS — I083 Combined rheumatic disorders of mitral, aortic and tricuspid valves: Secondary | ICD-10-CM | POA: Diagnosis not present

## 2022-02-14 DIAGNOSIS — Z90710 Acquired absence of both cervix and uterus: Secondary | ICD-10-CM

## 2022-02-14 DIAGNOSIS — Z794 Long term (current) use of insulin: Secondary | ICD-10-CM | POA: Diagnosis not present

## 2022-02-14 DIAGNOSIS — I719 Aortic aneurysm of unspecified site, without rupture: Secondary | ICD-10-CM | POA: Diagnosis not present

## 2022-02-14 DIAGNOSIS — Z1152 Encounter for screening for COVID-19: Secondary | ICD-10-CM | POA: Diagnosis not present

## 2022-02-14 DIAGNOSIS — I517 Cardiomegaly: Secondary | ICD-10-CM | POA: Diagnosis not present

## 2022-02-14 DIAGNOSIS — Z4682 Encounter for fitting and adjustment of non-vascular catheter: Secondary | ICD-10-CM | POA: Diagnosis not present

## 2022-02-14 DIAGNOSIS — Z833 Family history of diabetes mellitus: Secondary | ICD-10-CM

## 2022-02-14 DIAGNOSIS — J9811 Atelectasis: Secondary | ICD-10-CM | POA: Diagnosis not present

## 2022-02-14 DIAGNOSIS — E8770 Fluid overload, unspecified: Secondary | ICD-10-CM | POA: Diagnosis not present

## 2022-02-14 DIAGNOSIS — D62 Acute posthemorrhagic anemia: Secondary | ICD-10-CM | POA: Diagnosis not present

## 2022-02-14 DIAGNOSIS — I714 Abdominal aortic aneurysm, without rupture, unspecified: Secondary | ICD-10-CM | POA: Diagnosis not present

## 2022-02-14 HISTORY — PX: TEE WITHOUT CARDIOVERSION: SHX5443

## 2022-02-14 HISTORY — PX: BENTALL PROCEDURE: SHX5058

## 2022-02-14 HISTORY — PX: AORTIC VALVE REPLACEMENT: SHX41

## 2022-02-14 HISTORY — PX: REPLACEMENT ASCENDING AORTA: SHX6068

## 2022-02-14 LAB — POCT I-STAT, CHEM 8
BUN: 19 mg/dL (ref 8–23)
BUN: 20 mg/dL (ref 8–23)
BUN: 20 mg/dL (ref 8–23)
BUN: 19 mg/dL (ref 8–23)
BUN: 20 mg/dL (ref 8–23)
BUN: 19 mg/dL (ref 8–23)
Calcium, Ion: 1.14 mmol/L — ABNORMAL LOW (ref 1.15–1.40)
Calcium, Ion: 1.15 mmol/L (ref 1.15–1.40)
Calcium, Ion: 1.16 mmol/L (ref 1.15–1.40)
Calcium, Ion: 0.99 mmol/L — ABNORMAL LOW (ref 1.15–1.40)
Calcium, Ion: 0.99 mmol/L — ABNORMAL LOW (ref 1.15–1.40)
Calcium, Ion: 1.21 mmol/L (ref 1.15–1.40)
Chloride: 100 mmol/L (ref 98–111)
Chloride: 101 mmol/L (ref 98–111)
Chloride: 102 mmol/L (ref 98–111)
Chloride: 100 mmol/L (ref 98–111)
Chloride: 101 mmol/L (ref 98–111)
Chloride: 100 mmol/L (ref 98–111)
Creatinine, Ser: 0.9 mg/dL (ref 0.44–1.00)
Creatinine, Ser: 1 mg/dL (ref 0.44–1.00)
Creatinine, Ser: 1 mg/dL (ref 0.44–1.00)
Creatinine, Ser: 0.9 mg/dL (ref 0.44–1.00)
Creatinine, Ser: 0.8 mg/dL (ref 0.44–1.00)
Creatinine, Ser: 0.8 mg/dL (ref 0.44–1.00)
Glucose, Bld: 109 mg/dL — ABNORMAL HIGH (ref 70–99)
Glucose, Bld: 131 mg/dL — ABNORMAL HIGH (ref 70–99)
Glucose, Bld: 136 mg/dL — ABNORMAL HIGH (ref 70–99)
Glucose, Bld: 96 mg/dL (ref 70–99)
Glucose, Bld: 141 mg/dL — ABNORMAL HIGH (ref 70–99)
Glucose, Bld: 177 mg/dL — ABNORMAL HIGH (ref 70–99)
HCT: 27 % — ABNORMAL LOW (ref 36.0–46.0)
HCT: 28 % — ABNORMAL LOW (ref 36.0–46.0)
HCT: 28 % — ABNORMAL LOW (ref 36.0–46.0)
HCT: 22 % — ABNORMAL LOW (ref 36.0–46.0)
HCT: 22 % — ABNORMAL LOW (ref 36.0–46.0)
HCT: 21 % — ABNORMAL LOW (ref 36.0–46.0)
Hemoglobin: 9.2 g/dL — ABNORMAL LOW (ref 12.0–15.0)
Hemoglobin: 9.5 g/dL — ABNORMAL LOW (ref 12.0–15.0)
Hemoglobin: 9.5 g/dL — ABNORMAL LOW (ref 12.0–15.0)
Hemoglobin: 7.5 g/dL — ABNORMAL LOW (ref 12.0–15.0)
Hemoglobin: 7.5 g/dL — ABNORMAL LOW (ref 12.0–15.0)
Hemoglobin: 7.1 g/dL — ABNORMAL LOW (ref 12.0–15.0)
Potassium: 3.4 mmol/L — ABNORMAL LOW (ref 3.5–5.1)
Potassium: 3.4 mmol/L — ABNORMAL LOW (ref 3.5–5.1)
Potassium: 3.5 mmol/L (ref 3.5–5.1)
Potassium: 3.8 mmol/L (ref 3.5–5.1)
Potassium: 5.1 mmol/L (ref 3.5–5.1)
Potassium: 4.4 mmol/L (ref 3.5–5.1)
Sodium: 138 mmol/L (ref 135–145)
Sodium: 138 mmol/L (ref 135–145)
Sodium: 139 mmol/L (ref 135–145)
Sodium: 137 mmol/L (ref 135–145)
Sodium: 135 mmol/L (ref 135–145)
Sodium: 135 mmol/L (ref 135–145)
TCO2: 24 mmol/L (ref 22–32)
TCO2: 24 mmol/L (ref 22–32)
TCO2: 24 mmol/L (ref 22–32)
TCO2: 24 mmol/L (ref 22–32)
TCO2: 24 mmol/L (ref 22–32)
TCO2: 24 mmol/L (ref 22–32)

## 2022-02-14 LAB — POCT I-STAT 7, (LYTES, BLD GAS, ICA,H+H)
Acid-Base Excess: 2 mmol/L (ref 0.0–2.0)
Acid-Base Excess: 2 mmol/L (ref 0.0–2.0)
Acid-base deficit: 5 mmol/L — ABNORMAL HIGH (ref 0.0–2.0)
Acid-base deficit: 9 mmol/L — ABNORMAL HIGH (ref 0.0–2.0)
Acid-base deficit: 3 mmol/L — ABNORMAL HIGH (ref 0.0–2.0)
Bicarbonate: 25.9 mmol/L (ref 20.0–28.0)
Bicarbonate: 25.2 mmol/L (ref 20.0–28.0)
Bicarbonate: 19.9 mmol/L — ABNORMAL LOW (ref 20.0–28.0)
Bicarbonate: 15.6 mmol/L — ABNORMAL LOW (ref 20.0–28.0)
Bicarbonate: 21.5 mmol/L (ref 20.0–28.0)
Calcium, Ion: 1 mmol/L — ABNORMAL LOW (ref 1.15–1.40)
Calcium, Ion: 1.02 mmol/L — ABNORMAL LOW (ref 1.15–1.40)
Calcium, Ion: 1.22 mmol/L (ref 1.15–1.40)
Calcium, Ion: 0.93 mmol/L — ABNORMAL LOW (ref 1.15–1.40)
Calcium, Ion: 1.15 mmol/L (ref 1.15–1.40)
HCT: 20 % — ABNORMAL LOW (ref 36.0–46.0)
HCT: 21 % — ABNORMAL LOW (ref 36.0–46.0)
HCT: 30 % — ABNORMAL LOW (ref 36.0–46.0)
HCT: 16 % — ABNORMAL LOW (ref 36.0–46.0)
HCT: 24 % — ABNORMAL LOW (ref 36.0–46.0)
Hemoglobin: 6.8 g/dL — CL (ref 12.0–15.0)
Hemoglobin: 7.1 g/dL — ABNORMAL LOW (ref 12.0–15.0)
Hemoglobin: 10.2 g/dL — ABNORMAL LOW (ref 12.0–15.0)
Hemoglobin: 5.4 g/dL — CL (ref 12.0–15.0)
Hemoglobin: 8.2 g/dL — ABNORMAL LOW (ref 12.0–15.0)
O2 Saturation: 100 %
O2 Saturation: 100 %
O2 Saturation: 98 %
O2 Saturation: 100 %
O2 Saturation: 99 %
Patient temperature: 35.6
Patient temperature: 98.9
Potassium: 3.2 mmol/L — ABNORMAL LOW (ref 3.5–5.1)
Potassium: 4.7 mmol/L (ref 3.5–5.1)
Potassium: 3.6 mmol/L (ref 3.5–5.1)
Potassium: 2.8 mmol/L — ABNORMAL LOW (ref 3.5–5.1)
Potassium: 4.1 mmol/L (ref 3.5–5.1)
Sodium: 139 mmol/L (ref 135–145)
Sodium: 138 mmol/L (ref 135–145)
Sodium: 138 mmol/L (ref 135–145)
Sodium: 147 mmol/L — ABNORMAL HIGH (ref 135–145)
Sodium: 139 mmol/L (ref 135–145)
TCO2: 27 mmol/L (ref 22–32)
TCO2: 26 mmol/L (ref 22–32)
TCO2: 21 mmol/L — ABNORMAL LOW (ref 22–32)
TCO2: 16 mmol/L — ABNORMAL LOW (ref 22–32)
TCO2: 23 mmol/L (ref 22–32)
pCO2 arterial: 37.1 mmHg (ref 32–48)
pCO2 arterial: 33.8 mmHg (ref 32–48)
pCO2 arterial: 33.2 mmHg (ref 32–48)
pCO2 arterial: 28.7 mmHg — ABNORMAL LOW (ref 32–48)
pCO2 arterial: 36.2 mmHg (ref 32–48)
pH, Arterial: 7.452 — ABNORMAL HIGH (ref 7.35–7.45)
pH, Arterial: 7.481 — ABNORMAL HIGH (ref 7.35–7.45)
pH, Arterial: 7.379 (ref 7.35–7.45)
pH, Arterial: 7.343 — ABNORMAL LOW (ref 7.35–7.45)
pH, Arterial: 7.383 (ref 7.35–7.45)
pO2, Arterial: 367 mmHg — ABNORMAL HIGH (ref 83–108)
pO2, Arterial: 301 mmHg — ABNORMAL HIGH (ref 83–108)
pO2, Arterial: 105 mmHg (ref 83–108)
pO2, Arterial: 177 mmHg — ABNORMAL HIGH (ref 83–108)
pO2, Arterial: 120 mmHg — ABNORMAL HIGH (ref 83–108)

## 2022-02-14 LAB — GLUCOSE, CAPILLARY
Glucose-Capillary: 124 mg/dL — ABNORMAL HIGH (ref 70–99)
Glucose-Capillary: 133 mg/dL — ABNORMAL HIGH (ref 70–99)
Glucose-Capillary: 139 mg/dL — ABNORMAL HIGH (ref 70–99)
Glucose-Capillary: 103 mg/dL — ABNORMAL HIGH (ref 70–99)
Glucose-Capillary: 191 mg/dL — ABNORMAL HIGH (ref 70–99)
Glucose-Capillary: 127 mg/dL — ABNORMAL HIGH (ref 70–99)
Glucose-Capillary: 221 mg/dL — ABNORMAL HIGH (ref 70–99)
Glucose-Capillary: 201 mg/dL — ABNORMAL HIGH (ref 70–99)

## 2022-02-14 LAB — CBC
HCT: 32.8 % — ABNORMAL LOW (ref 36.0–46.0)
HCT: 28.7 % — ABNORMAL LOW (ref 36.0–46.0)
Hemoglobin: 11.1 g/dL — ABNORMAL LOW (ref 12.0–15.0)
Hemoglobin: 9.4 g/dL — ABNORMAL LOW (ref 12.0–15.0)
MCH: 29.6 pg (ref 26.0–34.0)
MCH: 28.7 pg (ref 26.0–34.0)
MCHC: 33.8 g/dL (ref 30.0–36.0)
MCHC: 32.8 g/dL (ref 30.0–36.0)
MCV: 87.5 fL (ref 80.0–100.0)
MCV: 87.8 fL (ref 80.0–100.0)
Platelets: 115 10*3/uL — ABNORMAL LOW (ref 150–400)
Platelets: 123 10*3/uL — ABNORMAL LOW (ref 150–400)
RBC: 3.75 MIL/uL — ABNORMAL LOW (ref 3.87–5.11)
RBC: 3.27 MIL/uL — ABNORMAL LOW (ref 3.87–5.11)
RDW: 13.6 % (ref 11.5–15.5)
RDW: 13.4 % (ref 11.5–15.5)
WBC: 14.4 10*3/uL — ABNORMAL HIGH (ref 4.0–10.5)
WBC: 16.5 10*3/uL — ABNORMAL HIGH (ref 4.0–10.5)
nRBC: 0 % (ref 0.0–0.2)
nRBC: 0 % (ref 0.0–0.2)

## 2022-02-14 LAB — PLATELET COUNT: Platelets: 131 10*3/uL — ABNORMAL LOW (ref 150–400)

## 2022-02-14 LAB — BASIC METABOLIC PANEL
Anion gap: 8 (ref 5–15)
BUN: 18 mg/dL (ref 8–23)
CO2: 22 mmol/L (ref 22–32)
Calcium: 7.9 mg/dL — ABNORMAL LOW (ref 8.9–10.3)
Chloride: 108 mmol/L (ref 98–111)
Creatinine, Ser: 1.08 mg/dL — ABNORMAL HIGH (ref 0.44–1.00)
GFR, Estimated: 58 mL/min — ABNORMAL LOW (ref >60)
Glucose, Bld: 204 mg/dL — ABNORMAL HIGH (ref 70–99)
Potassium: 4.4 mmol/L (ref 3.5–5.1)
Sodium: 138 mmol/L (ref 135–145)

## 2022-02-14 LAB — POCT I-STAT EG7
Acid-Base Excess: 2 mmol/L (ref 0.0–2.0)
Bicarbonate: 26 mmol/L (ref 20.0–28.0)
Calcium, Ion: 1.03 mmol/L — ABNORMAL LOW (ref 1.15–1.40)
HCT: 21 % — ABNORMAL LOW (ref 36.0–46.0)
Hemoglobin: 7.1 g/dL — ABNORMAL LOW (ref 12.0–15.0)
O2 Saturation: 83 %
Potassium: 3.1 mmol/L — ABNORMAL LOW (ref 3.5–5.1)
Sodium: 140 mmol/L (ref 135–145)
TCO2: 27 mmol/L (ref 22–32)
pCO2, Ven: 39.2 mmHg — ABNORMAL LOW (ref 44–60)
pH, Ven: 7.429 (ref 7.25–7.43)
pO2, Ven: 46 mmHg — ABNORMAL HIGH (ref 32–45)

## 2022-02-14 LAB — ECHO INTRAOPERATIVE TEE
AR max vel: 1.31 cm2
AV Area VTI: 1.41 cm2
AV Area mean vel: 1.27 cm2
AV Mean grad: 14 mmHg
AV Peak grad: 25.8 mmHg
Ao pk vel: 2.54 m/s
Height: 66 in
Weight: 2608 oz

## 2022-02-14 LAB — HEMOGLOBIN AND HEMATOCRIT, BLOOD
HCT: 21 % — ABNORMAL LOW (ref 36.0–46.0)
Hemoglobin: 7 g/dL — ABNORMAL LOW (ref 12.0–15.0)

## 2022-02-14 LAB — MAGNESIUM: Magnesium: 3.1 mg/dL — ABNORMAL HIGH (ref 1.7–2.4)

## 2022-02-14 LAB — PREPARE RBC (CROSSMATCH)

## 2022-02-14 LAB — PROTIME-INR
INR: 1.5 — ABNORMAL HIGH (ref 0.8–1.2)
Prothrombin Time: 18.4 seconds — ABNORMAL HIGH (ref 11.4–15.2)

## 2022-02-14 LAB — ABO/RH: ABO/RH(D): O NEG

## 2022-02-14 LAB — APTT: aPTT: 40 seconds — ABNORMAL HIGH (ref 24–36)

## 2022-02-14 LAB — FIBRINOGEN: Fibrinogen: 225 mg/dL (ref 210–475)

## 2022-02-14 SURGERY — REPLACEMENT, AORTIC VALVE, OPEN
Anesthesia: General | Site: Chest

## 2022-02-14 MED ORDER — SODIUM CHLORIDE (PF) 0.9 % IJ SOLN
INTRAMUSCULAR | Status: AC
  Filled 2022-02-14: qty 10

## 2022-02-14 MED ORDER — ORAL CARE MOUTH RINSE: 15.0000 mL | Freq: Once | OROMUCOSAL | Status: AC

## 2022-02-14 MED ORDER — ACETAMINOPHEN 160 MG/5ML PO SOLN: 650.0000 mg | Freq: Once | ORAL | Status: AC

## 2022-02-14 MED ORDER — ALBUMIN HUMAN 5 % IV SOLN: INTRAVENOUS | Status: DC | PRN

## 2022-02-14 MED ORDER — ASPIRIN 325 MG PO TBEC
325.0000 mg | DELAYED_RELEASE_TABLET | Freq: Every day | ORAL | Status: DC
  Administered 2022-02-15 – 2022-02-18 (×4): 325 mg via ORAL
  Filled 2022-02-14 (×4): qty 1

## 2022-02-14 MED ORDER — FLUTICASONE PROPIONATE 50 MCG/ACT NA SUSP
2.0000 | Freq: Every day | NASAL | Status: DC
  Administered 2022-02-14 – 2022-02-20 (×7): 2 via NASAL
  Filled 2022-02-14: qty 16

## 2022-02-14 MED ORDER — NITROGLYCERIN IN D5W 200-5 MCG/ML-% IV SOLN: 0.0000 ug/min | INTRAVENOUS | Status: DC

## 2022-02-14 MED ORDER — ALBUMIN HUMAN 5 % IV SOLN
250.0000 mL | INTRAVENOUS | Status: AC | PRN
  Administered 2022-02-14 (×4): 12.5 g via INTRAVENOUS
  Filled 2022-02-14 (×2): qty 250

## 2022-02-14 MED ORDER — PROTAMINE SULFATE 10 MG/ML IV SOLN
INTRAVENOUS | Status: DC | PRN
  Administered 2022-02-14: 240 mg via INTRAVENOUS

## 2022-02-14 MED ORDER — INSULIN ASPART 100 UNIT/ML IJ SOLN: 0.0000 [IU] | INTRAMUSCULAR | Status: DC | PRN

## 2022-02-14 MED ORDER — LACTATED RINGERS IV SOLN
500.0000 mL | Freq: Once | INTRAVENOUS | Status: AC | PRN
  Administered 2022-02-14: 500 mL via INTRAVENOUS

## 2022-02-14 MED ORDER — FENTANYL CITRATE (PF) 250 MCG/5ML IJ SOLN
INTRAMUSCULAR | Status: AC
  Filled 2022-02-14: qty 5

## 2022-02-14 MED ORDER — BISACODYL 5 MG PO TBEC
10.0000 mg | DELAYED_RELEASE_TABLET | Freq: Every day | ORAL | Status: DC
  Administered 2022-02-15 – 2022-02-17 (×3): 10 mg via ORAL
  Filled 2022-02-14 (×3): qty 2

## 2022-02-14 MED ORDER — SODIUM CHLORIDE 0.9 % IV SOLN: INTRAVENOUS | Status: DC

## 2022-02-14 MED ORDER — SODIUM FLUORIDE 1.1 % DT CREA: 1.0000 | TOPICAL_CREAM | Freq: Every day | DENTAL | Status: DC

## 2022-02-14 MED ORDER — PANTOPRAZOLE SODIUM 40 MG PO TBEC
40.0000 mg | DELAYED_RELEASE_TABLET | Freq: Every day | ORAL | Status: DC
  Administered 2022-02-16 – 2022-02-20 (×5): 40 mg via ORAL
  Filled 2022-02-14 (×5): qty 1

## 2022-02-14 MED ORDER — LACTATED RINGERS IV SOLN: INTRAVENOUS | Status: DC

## 2022-02-14 MED ORDER — ACETAMINOPHEN 500 MG PO TABS
1000.0000 mg | ORAL_TABLET | Freq: Four times a day (QID) | ORAL | Status: DC
  Administered 2022-02-14 – 2022-02-18 (×14): 1000 mg via ORAL
  Filled 2022-02-14 (×14): qty 2

## 2022-02-14 MED ORDER — TRAMADOL HCL 50 MG PO TABS
50.0000 mg | ORAL_TABLET | ORAL | Status: DC | PRN
  Administered 2022-02-15: 100 mg via ORAL
  Filled 2022-02-14: qty 2

## 2022-02-14 MED ORDER — METHYLPREDNISOLONE SODIUM SUCC 125 MG IJ SOLR
INTRAMUSCULAR | Status: DC | PRN
  Administered 2022-02-14: 125 mg via INTRAVENOUS

## 2022-02-14 MED ORDER — CHLORHEXIDINE GLUCONATE 0.12 % MT SOLN
15.0000 mL | Freq: Once | OROMUCOSAL | Status: AC
  Administered 2022-02-14: 15 mL via OROMUCOSAL
  Filled 2022-02-14: qty 15

## 2022-02-14 MED ORDER — SODIUM CHLORIDE 0.9% FLUSH
3.0000 mL | Freq: Two times a day (BID) | INTRAVENOUS | Status: DC
  Administered 2022-02-15 – 2022-02-17 (×6): 3 mL via INTRAVENOUS

## 2022-02-14 MED ORDER — SODIUM CHLORIDE 0.9% FLUSH: 3.0000 mL | INTRAVENOUS | Status: DC | PRN

## 2022-02-14 MED ORDER — ROCURONIUM BROMIDE 10 MG/ML (PF) SYRINGE
PREFILLED_SYRINGE | INTRAVENOUS | Status: DC | PRN
  Administered 2022-02-14 (×2): 50 mg via INTRAVENOUS
  Administered 2022-02-14: 100 mg via INTRAVENOUS
  Administered 2022-02-14 (×2): 50 mg via INTRAVENOUS

## 2022-02-14 MED ORDER — HEPARIN SODIUM (PORCINE) 1000 UNIT/ML IJ SOLN
INTRAMUSCULAR | Status: DC | PRN
  Administered 2022-02-14: 26000 [IU] via INTRAVENOUS

## 2022-02-14 MED ORDER — TRIAMCINOLONE ACETONIDE 55 MCG/ACT NA AERO: 2.0000 | INHALATION_SPRAY | Freq: Every day | NASAL | Status: DC | PRN

## 2022-02-14 MED ORDER — MIDAZOLAM HCL (PF) 5 MG/ML IJ SOLN
INTRAMUSCULAR | Status: DC | PRN
  Administered 2022-02-14: 1 mg via INTRAVENOUS
  Administered 2022-02-14 (×2): 2 mg via INTRAVENOUS
  Administered 2022-02-14 (×3): 1 mg via INTRAVENOUS

## 2022-02-14 MED ORDER — POTASSIUM CHLORIDE 10 MEQ/50ML IV SOLN
10.0000 meq | INTRAVENOUS | Status: AC
  Administered 2022-02-14 (×3): 10 meq via INTRAVENOUS

## 2022-02-14 MED ORDER — SODIUM CHLORIDE 0.9 % IV SOLN: 10.0000 mL/h | Freq: Once | INTRAVENOUS | Status: DC

## 2022-02-14 MED ORDER — ACETAMINOPHEN 160 MG/5ML PO SOLN: 1000.0000 mg | Freq: Four times a day (QID) | ORAL | Status: DC

## 2022-02-14 MED ORDER — METOPROLOL TARTRATE 12.5 MG HALF TABLET
ORAL_TABLET | ORAL | Status: AC
  Administered 2022-02-14: 12.5 mg via ORAL
  Filled 2022-02-14: qty 1

## 2022-02-14 MED ORDER — MORPHINE SULFATE (PF) 2 MG/ML IV SOLN
1.0000 mg | INTRAVENOUS | Status: DC | PRN
  Administered 2022-02-14: 2 mg via INTRAVENOUS
  Administered 2022-02-14: 4 mg via INTRAVENOUS
  Administered 2022-02-14 – 2022-02-17 (×5): 2 mg via INTRAVENOUS
  Filled 2022-02-14 (×2): qty 1
  Filled 2022-02-14 (×2): qty 2
  Filled 2022-02-14 (×3): qty 1

## 2022-02-14 MED ORDER — FAMOTIDINE IN NACL 20-0.9 MG/50ML-% IV SOLN
20.0000 mg | Freq: Two times a day (BID) | INTRAVENOUS | Status: AC
  Administered 2022-02-14 (×2): 20 mg via INTRAVENOUS
  Filled 2022-02-14 (×2): qty 50

## 2022-02-14 MED ORDER — ESTRADIOL 0.1 MG/GM VA CREA
1.0000 | TOPICAL_CREAM | VAGINAL | Status: DC
  Administered 2022-02-14: 1 via VAGINAL
  Filled 2022-02-14: qty 42.5

## 2022-02-14 MED ORDER — PROPOFOL 10 MG/ML IV BOLUS
INTRAVENOUS | Status: AC
  Filled 2022-02-14: qty 20

## 2022-02-14 MED ORDER — ~~LOC~~ CARDIAC SURGERY, PATIENT & FAMILY EDUCATION
Freq: Once | Status: DC
  Filled 2022-02-14: qty 1

## 2022-02-14 MED ORDER — SODIUM CHLORIDE (PF) 0.9 % IJ SOLN
OROMUCOSAL | Status: DC | PRN
  Administered 2022-02-14 (×3): 4 mL via TOPICAL

## 2022-02-14 MED ORDER — PLASMA-LYTE A IV SOLN: INTRAVENOUS | Status: DC | PRN

## 2022-02-14 MED ORDER — VANCOMYCIN HCL IN DEXTROSE 1-5 GM/200ML-% IV SOLN
1000.0000 mg | Freq: Once | INTRAVENOUS | Status: AC
  Administered 2022-02-14: 1000 mg via INTRAVENOUS
  Filled 2022-02-14: qty 200

## 2022-02-14 MED ORDER — CEFAZOLIN SODIUM-DEXTROSE 2-4 GM/100ML-% IV SOLN
2.0000 g | Freq: Three times a day (TID) | INTRAVENOUS | Status: AC
  Administered 2022-02-14 – 2022-02-16 (×6): 2 g via INTRAVENOUS
  Filled 2022-02-14 (×6): qty 100

## 2022-02-14 MED ORDER — METOPROLOL TARTRATE 12.5 MG HALF TABLET: 12.5000 mg | ORAL_TABLET | Freq: Two times a day (BID) | ORAL | Status: DC

## 2022-02-14 MED ORDER — MIDAZOLAM HCL 2 MG/2ML IJ SOLN: 2.0000 mg | INTRAMUSCULAR | Status: DC | PRN

## 2022-02-14 MED ORDER — PHENYLEPHRINE 80 MCG/ML (10ML) SYRINGE FOR IV PUSH (FOR BLOOD PRESSURE SUPPORT)
PREFILLED_SYRINGE | INTRAVENOUS | Status: AC
  Filled 2022-02-14: qty 10

## 2022-02-14 MED ORDER — METHYLPREDNISOLONE SODIUM SUCC 125 MG IJ SOLR
INTRAMUSCULAR | Status: AC
  Filled 2022-02-14: qty 2

## 2022-02-14 MED ORDER — PROTAMINE SULFATE 10 MG/ML IV SOLN
INTRAVENOUS | Status: AC
  Filled 2022-02-14: qty 25

## 2022-02-14 MED ORDER — ONDANSETRON HCL 4 MG/2ML IJ SOLN: 4.0000 mg | Freq: Four times a day (QID) | INTRAMUSCULAR | Status: DC | PRN

## 2022-02-14 MED ORDER — FENTANYL CITRATE (PF) 250 MCG/5ML IJ SOLN
INTRAMUSCULAR | Status: DC | PRN
  Administered 2022-02-14 (×4): 100 ug via INTRAVENOUS
  Administered 2022-02-14: 50 ug via INTRAVENOUS
  Administered 2022-02-14: 100 ug via INTRAVENOUS
  Administered 2022-02-14: 50 ug via INTRAVENOUS
  Administered 2022-02-14 (×3): 100 ug via INTRAVENOUS
  Administered 2022-02-14: 200 ug via INTRAVENOUS
  Administered 2022-02-14: 50 ug via INTRAVENOUS

## 2022-02-14 MED ORDER — PHENYLEPHRINE 80 MCG/ML (10ML) SYRINGE FOR IV PUSH (FOR BLOOD PRESSURE SUPPORT)
PREFILLED_SYRINGE | INTRAVENOUS | Status: DC | PRN
  Administered 2022-02-14 (×3): 80 ug via INTRAVENOUS
  Administered 2022-02-14: 40 ug via INTRAVENOUS

## 2022-02-14 MED ORDER — ROCURONIUM BROMIDE 10 MG/ML (PF) SYRINGE
PREFILLED_SYRINGE | INTRAVENOUS | Status: AC
  Filled 2022-02-14: qty 10

## 2022-02-14 MED ORDER — ROSUVASTATIN CALCIUM 5 MG PO TABS
5.0000 mg | ORAL_TABLET | Freq: Every day | ORAL | Status: DC
  Administered 2022-02-14 – 2022-02-19 (×6): 5 mg via ORAL
  Filled 2022-02-14 (×6): qty 1

## 2022-02-14 MED ORDER — MIDAZOLAM HCL (PF) 10 MG/2ML IJ SOLN
INTRAMUSCULAR | Status: AC
  Filled 2022-02-14: qty 2

## 2022-02-14 MED ORDER — METOCLOPRAMIDE HCL 5 MG/ML IJ SOLN
10.0000 mg | Freq: Four times a day (QID) | INTRAMUSCULAR | Status: AC
  Administered 2022-02-14 – 2022-02-15 (×3): 10 mg via INTRAVENOUS
  Filled 2022-02-14 (×3): qty 2

## 2022-02-14 MED ORDER — 0.9 % SODIUM CHLORIDE (POUR BTL) OPTIME
TOPICAL | Status: DC | PRN
  Administered 2022-02-14: 1000 mL

## 2022-02-14 MED ORDER — METOPROLOL TARTRATE 12.5 MG HALF TABLET: 12.5000 mg | ORAL_TABLET | Freq: Once | ORAL | Status: AC

## 2022-02-14 MED ORDER — DOCUSATE SODIUM 100 MG PO CAPS
200.0000 mg | ORAL_CAPSULE | Freq: Every day | ORAL | Status: DC
  Administered 2022-02-15 – 2022-02-17 (×3): 200 mg via ORAL
  Filled 2022-02-14 (×3): qty 2

## 2022-02-14 MED ORDER — PROPOFOL 10 MG/ML IV BOLUS
INTRAVENOUS | Status: DC | PRN
  Administered 2022-02-14: 20 mg via INTRAVENOUS
  Administered 2022-02-14 (×3): 50 mg via INTRAVENOUS
  Administered 2022-02-14: 20 mg via INTRAVENOUS
  Administered 2022-02-14: 40 mg via INTRAVENOUS
  Administered 2022-02-14: 200 mg via INTRAVENOUS
  Administered 2022-02-14: 50 mg via INTRAVENOUS
  Administered 2022-02-14: 80 mg via INTRAVENOUS
  Administered 2022-02-14: 30 mg via INTRAVENOUS

## 2022-02-14 MED ORDER — DEXMEDETOMIDINE HCL IN NACL 400 MCG/100ML IV SOLN
0.0000 ug/kg/h | INTRAVENOUS | Status: DC
  Administered 2022-02-14: 0.4 ug/kg/h via INTRAVENOUS
  Filled 2022-02-14: qty 100

## 2022-02-14 MED ORDER — ROCURONIUM BROMIDE 10 MG/ML (PF) SYRINGE
PREFILLED_SYRINGE | INTRAVENOUS | Status: AC
  Filled 2022-02-14: qty 30

## 2022-02-14 MED ORDER — HEMOSTATIC AGENTS (NO CHARGE) OPTIME
TOPICAL | Status: DC | PRN
  Administered 2022-02-14 (×3): 1 via TOPICAL

## 2022-02-14 MED ORDER — OXYCODONE HCL 5 MG PO TABS
5.0000 mg | ORAL_TABLET | ORAL | Status: DC | PRN
  Administered 2022-02-14 – 2022-02-15 (×2): 10 mg via ORAL
  Administered 2022-02-15: 5 mg via ORAL
  Administered 2022-02-15 – 2022-02-17 (×8): 10 mg via ORAL
  Administered 2022-02-18 (×4): 5 mg via ORAL
  Administered 2022-02-18 – 2022-02-19 (×4): 10 mg via ORAL
  Administered 2022-02-19: 5 mg via ORAL
  Administered 2022-02-19 – 2022-02-20 (×5): 10 mg via ORAL
  Filled 2022-02-14 (×7): qty 2
  Filled 2022-02-14: qty 1
  Filled 2022-02-14 (×4): qty 2
  Filled 2022-02-14: qty 1
  Filled 2022-02-14 (×2): qty 2
  Filled 2022-02-14 (×2): qty 1
  Filled 2022-02-14: qty 2
  Filled 2022-02-14: qty 1
  Filled 2022-02-14 (×2): qty 2
  Filled 2022-02-14: qty 1
  Filled 2022-02-14 (×2): qty 2
  Filled 2022-02-14 (×2): qty 1

## 2022-02-14 MED ORDER — LACTATED RINGERS IV SOLN: INTRAVENOUS | Status: DC | PRN

## 2022-02-14 MED ORDER — METOPROLOL TARTRATE 25 MG/10 ML ORAL SUSPENSION: 12.5000 mg | Freq: Two times a day (BID) | ORAL | Status: DC

## 2022-02-14 MED ORDER — SODIUM CHLORIDE 0.9 % IV SOLN: 250.0000 mL | INTRAVENOUS | Status: DC

## 2022-02-14 MED ORDER — CLEVIDIPINE BUTYRATE 0.5 MG/ML IV EMUL
INTRAVENOUS | Status: DC | PRN
  Administered 2022-02-14: 2 mg/h via INTRAVENOUS

## 2022-02-14 MED ORDER — ACETAMINOPHEN 650 MG RE SUPP
650.0000 mg | Freq: Once | RECTAL | Status: AC
  Administered 2022-02-14: 650 mg via RECTAL

## 2022-02-14 MED ORDER — PHENYLEPHRINE HCL-NACL 20-0.9 MG/250ML-% IV SOLN: 0.0000 ug/min | INTRAVENOUS | Status: DC

## 2022-02-14 MED ORDER — INSULIN REGULAR(HUMAN) IN NACL 100-0.9 UT/100ML-% IV SOLN
INTRAVENOUS | Status: DC
  Administered 2022-02-14: 2.8 [IU]/h via INTRAVENOUS

## 2022-02-14 MED ORDER — HEPARIN SODIUM (PORCINE) 1000 UNIT/ML IJ SOLN
INTRAMUSCULAR | Status: AC
  Filled 2022-02-14: qty 1

## 2022-02-14 MED ORDER — CLEVIDIPINE BUTYRATE 0.5 MG/ML IV EMUL
0.0000 mg/h | INTRAVENOUS | Status: DC
  Administered 2022-02-14: 0 mg/h via INTRAVENOUS
  Administered 2022-02-14: 7 mg/h via INTRAVENOUS
  Administered 2022-02-14: 13 mg/h via INTRAVENOUS
  Administered 2022-02-15: 4 mg/h via INTRAVENOUS
  Administered 2022-02-15: 21 mg/h via INTRAVENOUS
  Administered 2022-02-15: 17 mg/h via INTRAVENOUS
  Administered 2022-02-15: 20 mg/h via INTRAVENOUS
  Administered 2022-02-15: 10 mg/h via INTRAVENOUS
  Administered 2022-02-16: 5 mg/h via INTRAVENOUS
  Administered 2022-02-16 (×2): 8 mg/h via INTRAVENOUS
  Filled 2022-02-14 (×4): qty 100
  Filled 2022-02-14: qty 50
  Filled 2022-02-14: qty 100
  Filled 2022-02-14 (×4): qty 50
  Filled 2022-02-14: qty 100
  Filled 2022-02-14: qty 50
  Filled 2022-02-14 (×3): qty 100

## 2022-02-14 MED ORDER — CHLORHEXIDINE GLUCONATE CLOTH 2 % EX PADS
6.0000 | MEDICATED_PAD | Freq: Every day | CUTANEOUS | Status: DC
  Administered 2022-02-14 – 2022-02-18 (×5): 6 via TOPICAL

## 2022-02-14 MED ORDER — SODIUM CHLORIDE 0.9 % IV SOLN: INTRAVENOUS | Status: DC | PRN

## 2022-02-14 MED ORDER — ACETAMINOPHEN 500 MG PO TABS
1000.0000 mg | ORAL_TABLET | Freq: Once | ORAL | Status: AC
  Administered 2022-02-14: 1000 mg via ORAL
  Filled 2022-02-14: qty 2

## 2022-02-14 MED ORDER — THROMBIN 20000 UNITS EX SOLR
CUTANEOUS | Status: DC | PRN
  Administered 2022-02-14: 20000 [IU] via TOPICAL

## 2022-02-14 MED ORDER — MAGNESIUM SULFATE 4 GM/100ML IV SOLN
4.0000 g | Freq: Once | INTRAVENOUS | Status: AC
  Administered 2022-02-14: 4 g via INTRAVENOUS
  Filled 2022-02-14: qty 100

## 2022-02-14 MED ORDER — CHLORHEXIDINE GLUCONATE 0.12 % MT SOLN
15.0000 mL | OROMUCOSAL | Status: AC
  Administered 2022-02-14: 15 mL via OROMUCOSAL
  Filled 2022-02-14: qty 15

## 2022-02-14 MED ORDER — BISACODYL 10 MG RE SUPP: 10.0000 mg | Freq: Every day | RECTAL | Status: DC

## 2022-02-14 MED ORDER — CHLORHEXIDINE GLUCONATE 4 % EX LIQD: 30.0000 mL | CUTANEOUS | Status: DC

## 2022-02-14 MED ORDER — CHLORHEXIDINE GLUCONATE 0.12 % MT SOLN: 15.0000 mL | Freq: Once | OROMUCOSAL | Status: DC

## 2022-02-14 MED ORDER — ALPRAZOLAM 0.25 MG PO TABS
0.2500 mg | ORAL_TABLET | Freq: Two times a day (BID) | ORAL | Status: DC | PRN
  Administered 2022-02-14 – 2022-02-18 (×4): 0.25 mg via ORAL
  Filled 2022-02-14 (×4): qty 1

## 2022-02-14 MED ORDER — SODIUM BICARBONATE 8.4 % IV SOLN
50.0000 meq | Freq: Once | INTRAVENOUS | Status: AC
  Administered 2022-02-14: 50 meq via INTRAVENOUS

## 2022-02-14 MED ORDER — SODIUM CHLORIDE 0.45 % IV SOLN: INTRAVENOUS | Status: DC | PRN

## 2022-02-14 MED ORDER — THROMBIN (RECOMBINANT) 20000 UNITS EX SOLR
CUTANEOUS | Status: AC
  Filled 2022-02-14: qty 20000

## 2022-02-14 MED ORDER — ASPIRIN 81 MG PO CHEW: 324.0000 mg | CHEWABLE_TABLET | Freq: Every day | ORAL | Status: DC

## 2022-02-14 MED ORDER — DEXTROSE 50 % IV SOLN
0.0000 mL | INTRAVENOUS | Status: DC | PRN
  Administered 2022-02-17: 25 mL via INTRAVENOUS
  Filled 2022-02-14: qty 50

## 2022-02-14 MED ORDER — METOPROLOL TARTRATE 5 MG/5ML IV SOLN: 2.5000 mg | INTRAVENOUS | Status: DC | PRN

## 2022-02-14 MED FILL — Magnesium Sulfate Inj 50%: INTRAMUSCULAR | Qty: 10 | Status: CN

## 2022-02-14 MED FILL — Heparin Sodium (Porcine) Inj 1000 Unit/ML: Qty: 1000 | Status: AC

## 2022-02-14 MED FILL — Potassium Chloride Inj 2 mEq/ML: INTRAVENOUS | Qty: 40 | Status: AC

## 2022-02-14 MED FILL — Lidocaine HCl Local Preservative Free (PF) Inj 2%: INTRAMUSCULAR | Qty: 14 | Status: AC

## 2022-02-14 SURGICAL SUPPLY — 105 items
ADAPTER CARDIO PERF ANTE/RETRO (ADAPTER) ×2 IMPLANT
APPLICATOR TIP COSEAL (VASCULAR PRODUCTS) IMPLANT
BAG DECANTER FOR FLEXI CONT (MISCELLANEOUS) ×2 IMPLANT
BLADE CLIPPER SURG (BLADE) ×2 IMPLANT
BLADE STERNUM SYSTEM 6 (BLADE) ×2 IMPLANT
BLADE SURG 15 STRL LF DISP TIS (BLADE) ×2 IMPLANT
BLADE SURG 15 STRL SS (BLADE) ×2
CANISTER SUCT 3000ML PPV (MISCELLANEOUS) ×2 IMPLANT
CANNULA CARDIOPLEGIA 14FRX32 (CANNULA) IMPLANT
CANNULA GUNDRY RCSP 15FR (MISCELLANEOUS) ×2 IMPLANT
CANNULA MC2 2 STG 36/46 NON-V (CANNULA) IMPLANT
CANNULA SUMP PERICARDIAL (CANNULA) IMPLANT
CANNULA VENOUS 2 STG 34/46 (CANNULA) ×2
CATH CPB KIT GERHARDT (MISCELLANEOUS) IMPLANT
CATH HEART VENT LEFT (CATHETERS) ×2 IMPLANT
CATH ROBINSON RED A/P 18FR (CATHETERS) ×6 IMPLANT
CATH THORACIC 28FR (CATHETERS) IMPLANT
CATH THORACIC 36FR (CATHETERS) ×2 IMPLANT
CATH THORACIC 36FR RT ANG (CATHETERS) ×2 IMPLANT
CAUTERY SURG HI TEMP FINE TIP (MISCELLANEOUS) IMPLANT
CLIP TI MEDIUM 24 (CLIP) IMPLANT
CNTNR URN SCR LID CUP LEK RST (MISCELLANEOUS) ×2 IMPLANT
CONT SPEC 4OZ STRL OR WHT (MISCELLANEOUS) ×4
CONTAINER PROTECT SURGISLUSH (MISCELLANEOUS) ×4 IMPLANT
COVER SURGICAL LIGHT HANDLE (MISCELLANEOUS) ×2 IMPLANT
DEVICE SUT CK QUICK LOAD MINI (Prosthesis & Implant Heart) IMPLANT
DRAPE CARDIOVASCULAR INCISE (DRAPES) ×2
DRAPE SRG 135X102X78XABS (DRAPES) ×2 IMPLANT
DRAPE WARM FLUID 44X44 (DRAPES) ×2 IMPLANT
DRSG COVADERM 4X14 (GAUZE/BANDAGES/DRESSINGS) ×2 IMPLANT
ELECT CAUTERY BLADE 6.4 (BLADE) ×2 IMPLANT
ELECT REM PT RETURN 9FT ADLT (ELECTROSURGICAL) ×4
ELECTRODE REM PT RTRN 9FT ADLT (ELECTROSURGICAL) ×4 IMPLANT
FELT TEFLON 1X6 (MISCELLANEOUS) ×4 IMPLANT
GAUZE 4X4 16PLY ~~LOC~~+RFID DBL (SPONGE) ×2 IMPLANT
GAUZE SPONGE 4X4 12PLY STRL (GAUZE/BANDAGES/DRESSINGS) ×2 IMPLANT
GLOVE BIO SURGEON STRL SZ 6 (GLOVE) IMPLANT
GLOVE BIO SURGEON STRL SZ 6.5 (GLOVE) IMPLANT
GLOVE BIO SURGEON STRL SZ7 (GLOVE) IMPLANT
GLOVE BIO SURGEON STRL SZ7.5 (GLOVE) IMPLANT
GLOVE BIOGEL PI IND STRL 6.5 (GLOVE) IMPLANT
GLOVE BIOGEL PI IND STRL 7.0 (GLOVE) IMPLANT
GLOVE BIOGEL PI IND STRL 7.5 (GLOVE) IMPLANT
GLOVE SS BIOGEL STRL SZ 6 (GLOVE) IMPLANT
GLOVE SURG MICRO LTX SZ7 (GLOVE) ×4 IMPLANT
GLOVE SURG SS PI 6.5 STRL IVOR (GLOVE) IMPLANT
GOWN STRL REUS W/ TWL LRG LVL3 (GOWN DISPOSABLE) ×8 IMPLANT
GOWN STRL REUS W/ TWL XL LVL3 (GOWN DISPOSABLE) ×2 IMPLANT
GOWN STRL REUS W/TWL LRG LVL3 (GOWN DISPOSABLE) ×8
GOWN STRL REUS W/TWL XL LVL3 (GOWN DISPOSABLE) ×4
GRAFT HEMASHIELD 30X10 (Vascular Products) IMPLANT
HEMOSTAT POWDER SURGIFOAM 1G (HEMOSTASIS) ×6 IMPLANT
HEMOSTAT SURGICEL 2X14 (HEMOSTASIS) ×2 IMPLANT
IV ADAPTER SYR DOUBLE MALE LL (MISCELLANEOUS) IMPLANT
KIT BASIN OR (CUSTOM PROCEDURE TRAY) ×2 IMPLANT
KIT CATH CPB BARTLE (MISCELLANEOUS) ×2 IMPLANT
KIT SUCTION CATH 14FR (SUCTIONS) ×2 IMPLANT
KIT SUT CK MINI COMBO 4X17 (Prosthesis & Implant Heart) IMPLANT
KIT TURNOVER KIT B (KITS) ×2 IMPLANT
LINE VENT (MISCELLANEOUS) IMPLANT
LOOP VESSEL SUPERMAXI WHITE (MISCELLANEOUS) IMPLANT
NS IRRIG 1000ML POUR BTL (IV SOLUTION) ×12 IMPLANT
PACK E OPEN HEART (SUTURE) ×2 IMPLANT
PACK OPEN HEART (CUSTOM PROCEDURE TRAY) ×2 IMPLANT
PAD ARMBOARD 7.5X6 YLW CONV (MISCELLANEOUS) ×4 IMPLANT
POSITIONER HEAD DONUT 9IN (MISCELLANEOUS) ×2 IMPLANT
SEALANT SURG COSEAL 8ML (VASCULAR PRODUCTS) IMPLANT
SET MPS 3-ND DEL (MISCELLANEOUS) IMPLANT
SET VEIN GRAFT PERF (SET/KITS/TRAYS/PACK) IMPLANT
SET Y-VENT ADPTR 7.5 MALE LUER (ADAPTER) IMPLANT
SPONGE T-LAP 18X18 ~~LOC~~+RFID (SPONGE) ×8 IMPLANT
SPONGE T-LAP 4X18 ~~LOC~~+RFID (SPONGE) ×2 IMPLANT
STOPCOCK 4 WAY LG BORE MALE ST (IV SETS) IMPLANT
SUT BONE WAX W31G (SUTURE) ×2 IMPLANT
SUT EB EXC GRN/WHT 2-0 V-5 (SUTURE) ×4 IMPLANT
SUT ETHIBON EXCEL 2-0 V-5 (SUTURE) IMPLANT
SUT ETHIBOND V-5 VALVE (SUTURE) IMPLANT
SUT PROLENE 3 0 SH 1 (SUTURE) ×2 IMPLANT
SUT PROLENE 3 0 SH 48 (SUTURE) ×4 IMPLANT
SUT PROLENE 3 0 SH DA (SUTURE) IMPLANT
SUT PROLENE 3 0 SH1 36 (SUTURE) ×2 IMPLANT
SUT PROLENE 4 0 RB 1 (SUTURE) ×12
SUT PROLENE 4-0 RB1 .5 CRCL 36 (SUTURE) ×8 IMPLANT
SUT SILK 2 0 SH CR/8 (SUTURE) IMPLANT
SUT STEEL 6MS V (SUTURE) IMPLANT
SUT STEEL STERNAL CCS#1 18IN (SUTURE) IMPLANT
SUT STEEL SZ 6 DBL 3X14 BALL (SUTURE) IMPLANT
SUT VIC AB 1 CTX 27 (SUTURE) IMPLANT
SUT VIC AB 1 CTX 36 (SUTURE) ×4
SUT VIC AB 1 CTX36XBRD ANBCTR (SUTURE) ×4 IMPLANT
SUT VIC AB 2-0 CT1 27 (SUTURE)
SUT VIC AB 2-0 CT1 TAPERPNT 27 (SUTURE) IMPLANT
SUT VIC AB 3-0 X1 27 (SUTURE) IMPLANT
SYSTEM SAHARA CHEST DRAIN ATS (WOUND CARE) ×2 IMPLANT
TAPE CLOTH SURG 4X10 WHT LF (GAUZE/BANDAGES/DRESSINGS) IMPLANT
TAPE PAPER 2X10 WHT MICROPORE (GAUZE/BANDAGES/DRESSINGS) IMPLANT
TOWEL GREEN STERILE (TOWEL DISPOSABLE) ×2 IMPLANT
TOWEL GREEN STERILE FF (TOWEL DISPOSABLE) ×2 IMPLANT
TRAY FOLEY SLVR 14FR TEMP STAT (SET/KITS/TRAYS/PACK) ×2 IMPLANT
TUBING ART PRESS 48 MALE/FEM (TUBING) IMPLANT
UNDERPAD 30X36 HEAVY ABSORB (UNDERPADS AND DIAPERS) ×2 IMPLANT
VALVE AORTIC SZ23 INSP/RESIL (Prosthesis & Implant Heart) IMPLANT
VENT LEFT HEART 12002 (CATHETERS) ×4
WATER STERILE IRR 1000ML POUR (IV SOLUTION) ×4 IMPLANT
YANKAUER SUCT BULB TIP NO VENT (SUCTIONS) IMPLANT

## 2022-02-14 NOTE — Progress Notes (Signed)
Pt failed rapid wean at this time. Pt able to follow commands at this time. Pt instructed on vital capacity and a maximum of 289ml achieved after multiple attempts. Pt also instructed on NIF with -15cmH2O achieved again after multiple attempts. Pt returned to previous full support settings. RT will attempt again in one hour. RN aware.

## 2022-02-14 NOTE — Anesthesia Postprocedure Evaluation (Signed)
Anesthesia Post Note  Patient: Tenya Araque Ion  Procedure(s) Performed: AORTIC VALVE REPLACEMENT (AVR) USING EDWARDS INSPIRIS 23 MM RESILIA AORTIC VALVE (Chest) REPLACEMENT ASCENDING AORTIC ANEURYSM HEMASHIELD PLATINUM 30 MM GRAFT (Chest) possible BENTALL PROCEDURE (Chest) TRANSESOPHAGEAL ECHOCARDIOGRAM (TEE)     Patient location during evaluation: SICU Anesthesia Type: General Level of consciousness: sedated Pain management: pain level controlled Vital Signs Assessment: post-procedure vital signs reviewed and stable Respiratory status: patient remains intubated per anesthesia plan Cardiovascular status: stable Postop Assessment: no apparent nausea or vomiting Anesthetic complications: no   No notable events documented.  Last Vitals:  Vitals:   02/14/22 1500 02/14/22 1515  BP: 91/65   Pulse: 81 81  Resp: 12 12  Temp: (!) 35.8 C (!) 35.9 C  SpO2: 100% 100%    Last Pain:  Vitals:   02/14/22 1415  TempSrc: Core  PainSc:                  Ely Spragg,W. EDMOND

## 2022-02-14 NOTE — Progress Notes (Signed)
Rapid wean protocol started at 2030 SIMV 4, 40%.

## 2022-02-14 NOTE — Transfer of Care (Signed)
Immediate Anesthesia Transfer of Care Note  Patient: Courtney Lopez  Procedure(s) Performed: AORTIC VALVE REPLACEMENT (AVR) USING EDWARDS INSPIRIS 23 MM RESILIA AORTIC VALVE (Chest) REPLACEMENT ASCENDING AORTIC ANEURYSM HEMASHIELD PLATINUM 30 MM GRAFT (Chest) possible BENTALL PROCEDURE (Chest) TRANSESOPHAGEAL ECHOCARDIOGRAM (TEE)  Patient Location: ICU  Anesthesia Type:General  Level of Consciousness: Patient remains intubated per anesthesia plan  Airway & Oxygen Therapy: Patient remains intubated per anesthesia plan and Patient placed on Ventilator (see vital sign flow sheet for setting)  Post-op Assessment: Report given to RN and Post -op Vital signs reviewed and stable  Post vital signs: Reviewed and stable  Last Vitals:  Vitals Value Taken Time  BP 103/75   Temp    Pulse 89 02/14/22 1347  Resp 12 02/14/22 1347  SpO2 100 % 02/14/22 1347  Vitals shown include unvalidated device data.  Last Pain:  Vitals:   02/14/22 0606  TempSrc:   PainSc: 5          Complications: No notable events documented.

## 2022-02-14 NOTE — Brief Op Note (Signed)
02/14/2022  2:06 PM  PATIENT:  Courtney Lopez  61 y.o. female  PRE-OPERATIVE DIAGNOSIS: TAA SEVERE AS  POST-OPERATIVE DIAGNOSIS:  TAA SEVERE AS  PROCEDURE:  AORTIC VALVE REPLACEMENT (AVR) USING 23MM EDWARDS INSPIRIS RESILIA VALVE REPLACEMENT ASCENDING AORTIC ANEURYSM (N/A) - CIRC ARREST TRANSESOPHAGEAL ECHOCARDIOGRAM (TEE) (N/A)  SURGEON:  Surgeon(s) and Role:  Gaye Pollack, MD - Primary  PHYSICIAN ASSISTANT: Wynelle Beckmann PA-C  ASSISTANTS: Ara Kussmaul RNFA   ANESTHESIA:   general  EBL:  1339 mL   BLOOD ADMINISTERED: 624 CC PRBC  DRAINS:  Mediastinal tubes and right pleural tube    LOCAL MEDICATIONS USED:  NONE  SPECIMEN:  Source of Specimen:  Aorta and aortic valve leaflets  DISPOSITION OF SPECIMEN:  PATHOLOGY  COUNTS CORRECT:  YES  DICTATION: .Dragon Dictation  PLAN OF CARE: Admit to inpatient   PATIENT DISPOSITION:  ICU - intubated and hemodynamically stable.   Delay start of Pharmacological VTE agent (>24hrs) due to surgical blood loss or risk of bleeding: yes

## 2022-02-14 NOTE — Interval H&P Note (Signed)
History and Physical Interval Note:  02/14/2022 7:06 AM  Courtney Lopez  has presented today for surgery, with the diagnosis of TAA AS.  The various methods of treatment have been discussed with the patient and family. After consideration of risks, benefits and other options for treatment, the patient has consented to  Procedure(s) with comments: AORTIC VALVE REPLACEMENT (AVR) (N/A) REPLACEMENT ASCENDING AORTIC ANEURYSM (N/A) - CIRC ARREST possible BENTALL PROCEDURE (N/A) TRANSESOPHAGEAL ECHOCARDIOGRAM (TEE) (N/A) as a surgical intervention.  The patient's history has been reviewed, patient examined, no change in status, stable for surgery.  I have reviewed the patient's chart and labs.  Questions were answered to the patient's satisfaction.     Gaye Pollack

## 2022-02-14 NOTE — Anesthesia Procedure Notes (Signed)
Procedure Name: Intubation Date/Time: 02/14/2022 8:04 AM  Performed by: Erick Colace, CRNAPre-anesthesia Checklist: Patient identified, Emergency Drugs available, Suction available and Patient being monitored Patient Re-evaluated:Patient Re-evaluated prior to induction Oxygen Delivery Method: Circle system utilized Preoxygenation: Pre-oxygenation with 100% oxygen Induction Type: IV induction Ventilation: Mask ventilation without difficulty Laryngoscope Size: Glidescope and 3 Grade View: Grade I Tube type: Oral Tube size: 8.0 mm Number of attempts: 1 Airway Equipment and Method: Stylet and Oral airway Placement Confirmation: ETT inserted through vocal cords under direct vision, positive ETCO2 and breath sounds checked- equal and bilateral Secured at: 23 cm Tube secured with: Tape Dental Injury: Teeth and Oropharynx as per pre-operative assessment  Difficulty Due To: Difficult Airway- due to reduced neck mobility Comments: Elective glidescope due to patient history of cervical neck stenosis and limited neck range of motion.

## 2022-02-14 NOTE — OR Nursing (Signed)
Patient intra-operatively placed on circulatory arrest (see Perfusion Notes) for further information.

## 2022-02-14 NOTE — Op Note (Signed)
CARDIOVASCULAR SURGERY OPERATIVE NOTE  02/14/2022  Surgeon:  Gaye Pollack, MD  First Assistant: Wynelle Beckmann,  PA-C: An experienced assistant was required given the complexity of this surgery and the standard of surgical care. The assistant was needed for exposure, dissection, suctioning, retraction of delicate tissues and sutures, instrument exchange and for overall help during this procedure.    Preoperative Diagnosis:  Severe bicuspid aortic valve stenosis and ascending aortic aneurysm   Postoperative Diagnosis:  Same   Procedure:  Median Sternotomy Extracorporeal circulation 3.   Supra-coronary replacement of the ascending aorta (hemi-arch) using a 30 mm Hemashield graft under deep hypothermic circulatory arrest 4.   Aortic valve replacement using a 23 mm Edwards INSPIRIS RESILIA pericardial valve.  Anesthesia:  General Endotracheal   Clinical History/Surgical Indication:    This 61 year old woman has stage D3, paradoxical low gradient/low flow severe bicuspid aortic stenosis with NYHA class III symptoms of exertional fatigue and shortness of breath as well as episodic dizziness.  This is associated with a stable 4.8 cm fusiform ascending aortic aneurysm.  I agree it is time to proceed with aortic valve replacement and replacement of her ascending aorta.  I would have to make a decision about supra-coronary replacement or Bentall procedure at the time of surgery.  I have recommended using a bioprosthetic valve given her age.  This will require a period of circulatory arrest for the distal anastomosis at the aortic arch.   I discussed the operative procedure with the patient  including alternatives, benefits and risks; including but not limited to bleeding, blood transfusion, infection, stroke, myocardial infarction, graft failure, heart block requiring a permanent pacemaker, organ dysfunction, and death.  Courtney Lopez understands and agrees to proceed.     Preparation:  The patient was seen in the preoperative holding area and the correct patient, correct operation were confirmed with the patient after reviewing the medical record and catheterization. The consent was signed by me. Preoperative antibiotics were given. A pulmonary arterial line and radial arterial line were placed by the anesthesia team. The patient was taken back to the operating room and positioned supine on the operating room table. After being placed under general endotracheal anesthesia by the anesthesia team a foley catheter was placed. The neck, chest, abdomen, and both legs were prepped with betadine soap and solution and draped in the usual sterile manner. A surgical time-out was taken and the correct patient and operative procedure were confirmed with the nursing and anesthesia staff.  TEE:  Performed by by Dr. Suann Larry. This showed severe bicuspid aortic valve stenosis with normal LV systolic function.   Cardiopulmonary Bypass:  A median sternotomy was performed. The pericardium was opened in the midline. Right ventricular function appeared normal. The ascending aorta was of normal size and had no palpable plaque. There were no contraindications to aortic cannulation or cross-clamping. The patient was fully systemically heparinized and the ACT was maintained > 400 sec. The distal ascending aorta was cannulated with a 20 F aortic cannula for arterial inflow. Venous cannulation was performed via the right atrial appendage using a two-staged venous cannula.  A temperature probe was inserted into the interventricular septum and an insulating pad was placed in the pericardium. CO2 was insufflated into the pericardium throughout the case to minimize intracardiac air.    Resection and grafting of ascending aortic aneurysm:  The patient was placed on cardiopulmonary bypass and a left ventricular vent was placed via the right superior pulmonary vein. Systemic cooling was  begun with a goal temperature of 18 degrees centigrade by bladder and rectal temperature probes. A retrograde cardioplegia cannula was placed through the right atrium into the coronary sinus without difficulty. A retrograde cerebral perfusion cannula was placed into the SVC through a pursestring suture and the SVC was encircled with a silastic tape.  After 20 minutes of cooling the target temperature of 18 degrees centigrade was reached. The patient was given Propofol and and 125 mg of Solumedrol. BIS decreased to zero. The head was packed in ice. The bed was placed in steep trendelenburg. Circulatory arrest was begun and the blood volume emptied into the venous reservoir.  Cold KBC retrograde cardioplegia was given and myocardial temperature dropped to 10 degrees centigrade. Additional doses were given at approximately 60 minute intervals throughout the period of circulatory arrest and cross-clamping. Complete diastolic arrest was maintained. After completion of the initial dose of cardioplegia the SVC was occluded with the silastic tape and retrograde cerebral perfusion was begun. The aortic cannula was removed. The aorta was transected just proximal to the innominate artery beveling the resection out along the undersurface of the aortic arch (Hemiarch replacement). The aortic diameter was measured at 30 mm here. A 30 x 10 mm Hemasheild Platinum vascular graft was prepared. ( Catalog # [id number] P0, Lot [id number], SN [id number]). It was anastomosed to the aortic arch in an end to end manner using 3-0 prolene continuous suture with a felt strip to reinforce the anastomisis. A light coating of CoSeal was applied to seal needle holes. The arterial end of the bypass circuit was then connected to the 1mm side arm graft and circulation was slowly resumed. The tape was removed from the SVC. Retrograde cerebral perfusion was stopped. The aortic graft was cross-clamped proximal to the side arm graft and full CPB support  was resumed. Circulatory arrest time was 24 minutes. Retrograde cerebral perfusion time was 16 minutes.  Aortic Valve Replacement:   The ascending aorta was mobilized from the right pulmonary artery and main PA. It was opened longitudinally and the valve inspected. The native valve was bicuspid with fusion of the left and right cusps and a single complete raphe. The leaflets were moderately calcified and there was mild annular calcification. The ostia of the coronary arteries were in normal position and were not obstructed. The aortic root appeared to be normal in size and the supra-coronary aorta at the STJ was measured at 30 mm. I did not think that the aortic root needed to be replaced. The native valve leaflets were excised and the annulus was decalcified with rongeurs. Care was taken to remove all particulate debris. The left ventricle was directly inspected for debris and then irrigated with ice saline solution. The annulus was sized and a size 23 mm Edwards INSPIRIS RESILIA  pericardial valve was chosen. The model number was [id number] and the serial number was [id number]. 2-0 Ethibond pledgeted horizontal mattress sutures were placed around the annulus with the pledgets in a sub-annular position. The sutures were placed through the sewing ring and the valve lowered into place. The sutures were tied sequentially. The valve seated nicely and the coronary ostia were not obstructed. The prosthetic valve leaflets moved normally and there was no sub-valvular obstruction.   The aortic graft was cut to the appropriate length and anastomosed end to end to the supra-coronary aorta using continuous 3-0 prolene suture with a felt strip to reinforce the anastomosis. CoSeal was applied to seal the needle holes in the grafts. A vent cannula  was placed into the graft to remove any air. Deairing maneuvers were performed and the bed placed in trendelenburg position.   Completion:   The patient was rewarmed to 37 degrees  Centigrade. A reanimation dose of warm retrograde KBC cardioplegia was given. The crossclamp was removed with a time of 94 minutes. There was spontaneous return of sinus rhythm. The position of the graft was satisfactory. The vascular anastomoses all appeared hemostatic. Two temporary epicardial pacing wires were placed on the right atrium and two on the right ventricle. The patient was weaned from CPB on no inotropic agents. CPB time was 167 minutes. Cardiac output was 5 LPM. TEE showed a normal functioning aortic valve prosthesis with no AI. There was unchanged normal LV function. Heparin was fully reversed with protamine and the venous cannula removed. The aortic side arm graft was ligated with a #1 silk tie and divided. The stump was suture ligated with a 3-0 Prolene pledgetted mattress suture. Hemostasis was achieved. Mediastinal and right pleural drainage tubes were placed. The sternum was closed with  #6 stainless steel wires. The fascia was closed with continuous # 1 vicryl suture. The subcutaneous tissue was closed with 2-0 vicryl continuous suture. The skin was closed with 3-0 vicryl subcuticular suture. All sponge, needle, and instrument counts were reported correct at the end of the case. Dry sterile dressings were placed over the incisions and around the chest tubes which were connected to pleurevac suction. The patient was then transported to the surgical intensive care unit in stable condition.

## 2022-02-14 NOTE — Anesthesia Procedure Notes (Signed)
Arterial Line Insertion Start/End10/09/2021 7:00 AM, 02/14/2022 7:15 AM Performed by: Janace Litten, CRNA, CRNA  Patient location: Pre-op. Preanesthetic checklist: patient identified, IV checked, site marked, risks and benefits discussed, surgical consent, monitors and equipment checked, pre-op evaluation, timeout performed and anesthesia consent Lidocaine 1% used for infiltration Right, radial was placed Catheter size: 20 G Hand hygiene performed  and maximum sterile barriers used   Attempts: 3 Procedure performed using ultrasound guided technique. Following insertion, dressing applied and Biopatch. Post procedure assessment: normal and unchanged  Post procedure complications: unsuccessful attempts and second provider assisted. Patient tolerated the procedure well with no immediate complications. Additional procedure comments: 2 previous attempts made on left side with difficulty threading catheter, despite adequate blood return. [name]

## 2022-02-14 NOTE — Progress Notes (Signed)
      [address]       [place] [address]             [phone]    S/p AVR, ascending aneurysm repair  BP 90/66   Pulse 82   Temp 97.7 F (36.5 C)   Resp 12   Ht 5\' 6"  (1.676 m)   Wt 73.9 kg   LMP  (LMP Unknown)   SpO2 100%   BMI 26.31 kg/m  17/6 CI 1.5   Intake/Output Summary (Last 24 hours) at 02/14/2022 1649 Last data filed at 02/14/2022 1600 Gross per 24 hour  Intake 2990 ml  Output 2714 ml  Net 276 ml   Hct 33 225 ml CT output since OR  Doing well, index should improve as volume status corrected  Remo Lipps C. Roxan Hockey, MD [facility] [phone]

## 2022-02-14 NOTE — Anesthesia Procedure Notes (Signed)
Arterial Line Insertion Start/End10/09/2021 7:45 AM, 02/14/2022 8:00 AM Performed by: Roderic Palau, MD, anesthesiologist  Patient location: Pre-op. Preanesthetic checklist: patient identified, IV checked, site marked, risks and benefits discussed, surgical consent, monitors and equipment checked, pre-op evaluation, timeout performed and anesthesia consent Lidocaine 1% used for infiltration Right, brachial was placed Catheter size: 20 G Hand hygiene performed , maximum sterile barriers used  and Seldinger technique used  Attempts: 1 Procedure performed using ultrasound guided technique. Ultrasound Notes:anatomy identified, needle tip was noted to be adjacent to the nerve/plexus identified, no ultrasound evidence of intravascular and/or intraneural injection and image(s) printed for medical record Following insertion, dressing applied, Biopatch and line sutured. Post procedure assessment: normal and unchanged  Patient tolerated the procedure well with no immediate complications.

## 2022-02-14 NOTE — Anesthesia Procedure Notes (Signed)
Central Venous Catheter Insertion Performed by: Roderic Palau, MD, anesthesiologist Start/End10/09/2021 6:45 AM, 02/14/2022 7:00 AM Patient location: Pre-op. Preanesthetic checklist: patient identified, IV checked, site marked, risks and benefits discussed, surgical consent, monitors and equipment checked, pre-op evaluation, timeout performed and anesthesia consent Position: Trendelenburg Lidocaine 1% used for infiltration and patient sedated Hand hygiene performed , maximum sterile barriers used  and Seldinger technique used Catheter size: 9 Fr Total catheter length 10. Central line was placed.MAC introducer Procedure performed using ultrasound guided technique. Ultrasound Notes:anatomy identified, needle tip was noted to be adjacent to the nerve/plexus identified, no ultrasound evidence of intravascular and/or intraneural injection and image(s) printed for medical record Attempts: 1 Following insertion, line sutured, dressing applied and Biopatch. Post procedure assessment: blood return through all ports, free fluid flow and no air  Patient tolerated the procedure well with no immediate complications.

## 2022-02-14 NOTE — Progress Notes (Signed)
  Echocardiogram Echocardiogram Transesophageal has been performed.  Darlina Sicilian M 02/14/2022, 8:43 AM

## 2022-02-14 NOTE — Progress Notes (Signed)
PS/CPAP 10/5 40% started per rapid wean protocol.

## 2022-02-14 NOTE — Progress Notes (Signed)
  Transition of Care [facility]) Screening Note   Patient Details  Name: Courtney Lopez Date of Birth: 1960/09/04   Transition of Care [facility]) CM/SW Contact:    Milas Gain, [place] Phone Number: 02/14/2022, 3:49 PM    Transition of Care Department [facility]) has reviewed patient and no TOC needs have been identified at this time. We will continue to monitor patient advancement through interdisciplinary progression rounds. If new patient transition needs arise, please place a TOC consult.

## 2022-02-14 NOTE — Anesthesia Procedure Notes (Signed)
Central Venous Catheter Insertion Performed by: Roderic Palau, MD, anesthesiologist Start/End10/09/2021 6:45 AM, 02/14/2022 7:00 AM Patient location: Pre-op. Preanesthetic checklist: patient identified, IV checked, site marked, risks and benefits discussed, surgical consent, monitors and equipment checked, pre-op evaluation, timeout performed and anesthesia consent Hand hygiene performed  and maximum sterile barriers used  PA cath was placed.Swan type:thermodilution PA Cath depth:50 Procedure performed without using ultrasound guided technique. Attempts: 1 Patient tolerated the procedure well with no immediate complications.

## 2022-02-14 NOTE — Hospital Course (Addendum)
[address]       [place] [address]             [phone]                             Cardiothoracic Surgery Admission History and Physical     PCP is Crecencio Mc, MD Referring Provider is Crecencio Mc, MD          Chief Complaint  Patient presents with   Thoracic Aortic Aneurysm      Initial surgical consult, CTA chest 8/9      HPI:   The patient is a 61 year old woman with a history of hypertension, diabetes, renal artery stenosis felt to be secondary to fibromuscular dysplasia, bicuspid aortic valve disease, and an ascending aortic aneurysm that has been followed with periodic echocardiogram and CTA chest.  Her echocardiogram in August 2021 was read as showing a trileaflet aortic valve with moderate aortic stenosis with a mean gradient of 22.5 mmHg.  Aortic valve area by VTI was 1.09 cm with a dimensionless index of 0.32.  Left ventricular ejection fraction was 60 to 65% with mild LVH and grade 1 diastolic dysfunction. CTA of the chest on 12/19/2020 showed a stable fusiform ascending aortic aneurysm with maximum diameter of 5 cm.  The previous scan in 2020 showed the diameter to be 4.9 cm.   When I first saw her on 01/01/2021 she had class II symptoms of exertional fatigue and shortness of breath.  I recommended aortic valve replacement and supra coronary replacement of her ascending aorta at that time but she was following up with Dr. Rockey Situ for next week and wanted to discuss it further with him.  She has continued to work as an AD at [employer].  She has developed worsening exertional shortness of breath and fatigue as well as episodes of dizziness with minimal exertion.  She had a follow-up echocardiogram on 12/25/2021 that showed a mean gradient of 23 mmHg and a peak gradient of 41 mmHg with a valve area of 0.68 cm.  Her mean gradient a year ago was 31 mmHg.  Her stroke-volume index has decreased to 26 with a left ventricular  ejection fraction of 60 to 65%.  She has moderate asymmetric LVH of the basal-septal segment.     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  She is married and lives with her husband.  She reports that over the past year she has developed exertional fatigue and shortness of breath.  She has found that after she works 3 days in a row that she is completely exhausted and has to spend the fourth day laying around not doing anything.  She has had some dizziness but no syncope. She has had lower extremity edema.  She denies PND or orthopnea. She denies any chest pain or pressure.  Dr. Cyndia Bent reviewed the patient's chart, labs and diagnostic studies and determined surgical intervention would provide Courtney Lopez the best long term treatment. He discussed the patient's treatment options as well as the risks and benefits. After careful consideration Courtney Lopez was agreeable to proceed with aortic valve replacement and ascending aorta replacement.   Hospital Course: Courtney Lopez arrived at [facility] and was brought to the operating room on 02/14/22. She underwent an aortic valve replacement with a 40mm Edwards  Inspiris Resilia bioprosthetic valve and an ascending aorta replacement, she tolerated the procedure well and was transferred to the SICU in stable condition. She was extubated the night of surgery without complication. Drips were weaned as tolerated. Low dose Coreg was started and titrated as able. Swan ganz catheter and arterial line was removed without complication. She was volume overloaded so diuresis was started. She has a history of T2DM, preop A1C was  5.4. CBGs were controlled, she was transitioned to SSI and levemir.  She was mobilized while in the ICU and progressed well with ambulation.  Hypertension early postoperatively was managed with IV Cleviprex.  She was later transitioned back to her home antihypertensive medications.  Diuresis was  begun on postop day 2 for expected volume excess.  Chest tubes were removed on postop day 3 after drainage tapered off.  Levemir dosing was adjusted for hypoglycemia.

## 2022-02-14 NOTE — Procedures (Signed)
Extubation Procedure Note  Patient Details:   Name: Courtney Lopez DOB: [birth year] MRN: [id number]   Airway Documentation:  Airway 8 mm (Active)  Secured at (cm) 26 cm 02/14/22 2030  Measured From Lips 02/14/22 2030  Secured Location Right 02/14/22 2030  Secured By Pink Tape 02/14/22 2030  Prone position No 02/14/22 2030  Cuff Pressure (cm H2O) MOV (Manual Technique) 02/14/22 2030  Site Condition Dry 02/14/22 2030   Vent end date 02/14/22 Vent end time: 2138  Evaluation  O2 sats: stable throughout Complications: No apparent complications Patient did tolerate procedure well. Bilateral Breath Sounds: Clear, Diminished   Yes Pt able to state name w/o complications. VSS throughout procedure. Pt now on 4L [place] and resting comfortably. RT will cont to monitor as needed VC 1.8L NIF -25  [place] G Elliotte Marsalis 02/14/2022, 9:39 PM

## 2022-02-15 ENCOUNTER — Inpatient Hospital Stay (HOSPITAL_COMMUNITY): Payer: 59

## 2022-02-15 ENCOUNTER — Encounter (HOSPITAL_COMMUNITY): Payer: Self-pay | Admitting: Surgery

## 2022-02-15 LAB — GLUCOSE, CAPILLARY
Glucose-Capillary: 106 mg/dL — ABNORMAL HIGH (ref 70–99)
Glucose-Capillary: 118 mg/dL — ABNORMAL HIGH (ref 70–99)
Glucose-Capillary: 121 mg/dL — ABNORMAL HIGH (ref 70–99)
Glucose-Capillary: 149 mg/dL — ABNORMAL HIGH (ref 70–99)
Glucose-Capillary: 150 mg/dL — ABNORMAL HIGH (ref 70–99)
Glucose-Capillary: 169 mg/dL — ABNORMAL HIGH (ref 70–99)
Glucose-Capillary: 169 mg/dL — ABNORMAL HIGH (ref 70–99)
Glucose-Capillary: 170 mg/dL — ABNORMAL HIGH (ref 70–99)
Glucose-Capillary: 176 mg/dL — ABNORMAL HIGH (ref 70–99)
Glucose-Capillary: 176 mg/dL — ABNORMAL HIGH (ref 70–99)
Glucose-Capillary: 185 mg/dL — ABNORMAL HIGH (ref 70–99)
Glucose-Capillary: 207 mg/dL — ABNORMAL HIGH (ref 70–99)
Glucose-Capillary: 94 mg/dL (ref 70–99)

## 2022-02-15 LAB — CBC
HCT: 24.7 % — ABNORMAL LOW (ref 36.0–46.0)
HCT: 24.5 % — ABNORMAL LOW (ref 36.0–46.0)
Hemoglobin: 8.5 g/dL — ABNORMAL LOW (ref 12.0–15.0)
Hemoglobin: 8.3 g/dL — ABNORMAL LOW (ref 12.0–15.0)
MCH: 29.8 pg (ref 26.0–34.0)
MCH: 29.2 pg (ref 26.0–34.0)
MCHC: 34.4 g/dL (ref 30.0–36.0)
MCHC: 33.9 g/dL (ref 30.0–36.0)
MCV: 86.7 fL (ref 80.0–100.0)
MCV: 86.3 fL (ref 80.0–100.0)
Platelets: 113 10*3/uL — ABNORMAL LOW (ref 150–400)
Platelets: 153 10*3/uL (ref 150–400)
RBC: 2.85 MIL/uL — ABNORMAL LOW (ref 3.87–5.11)
RBC: 2.84 MIL/uL — ABNORMAL LOW (ref 3.87–5.11)
RDW: 13.9 % (ref 11.5–15.5)
RDW: 14.3 % (ref 11.5–15.5)
WBC: 16.8 10*3/uL — ABNORMAL HIGH (ref 4.0–10.5)
WBC: 21.9 10*3/uL — ABNORMAL HIGH (ref 4.0–10.5)
nRBC: 0 % (ref 0.0–0.2)
nRBC: 0 % (ref 0.0–0.2)

## 2022-02-15 LAB — BASIC METABOLIC PANEL
Anion gap: 10 (ref 5–15)
Anion gap: 8 (ref 5–15)
BUN: 20 mg/dL (ref 8–23)
BUN: 17 mg/dL (ref 8–23)
CO2: 21 mmol/L — ABNORMAL LOW (ref 22–32)
CO2: 23 mmol/L (ref 22–32)
Calcium: 8.5 mg/dL — ABNORMAL LOW (ref 8.9–10.3)
Calcium: 8.2 mg/dL — ABNORMAL LOW (ref 8.9–10.3)
Chloride: 107 mmol/L (ref 98–111)
Chloride: 104 mmol/L (ref 98–111)
Creatinine, Ser: 1.05 mg/dL — ABNORMAL HIGH (ref 0.44–1.00)
Creatinine, Ser: 1.25 mg/dL — ABNORMAL HIGH (ref 0.44–1.00)
GFR, Estimated: 60 mL/min — ABNORMAL LOW (ref >60)
GFR, Estimated: 48 mL/min — ABNORMAL LOW (ref >60)
Glucose, Bld: 149 mg/dL — ABNORMAL HIGH (ref 70–99)
Glucose, Bld: 122 mg/dL — ABNORMAL HIGH (ref 70–99)
Potassium: 3.8 mmol/L (ref 3.5–5.1)
Potassium: 3.9 mmol/L (ref 3.5–5.1)
Sodium: 138 mmol/L (ref 135–145)
Sodium: 135 mmol/L (ref 135–145)

## 2022-02-15 LAB — POCT I-STAT 7, (LYTES, BLD GAS, ICA,H+H)
Acid-base deficit: 4 mmol/L — ABNORMAL HIGH (ref 0.0–2.0)
Bicarbonate: 20.2 mmol/L (ref 20.0–28.0)
Calcium, Ion: 1.16 mmol/L (ref 1.15–1.40)
HCT: 23 % — ABNORMAL LOW (ref 36.0–46.0)
Hemoglobin: 7.8 g/dL — ABNORMAL LOW (ref 12.0–15.0)
O2 Saturation: 97 %
Potassium: 3.9 mmol/L (ref 3.5–5.1)
Sodium: 140 mmol/L (ref 135–145)
TCO2: 21 mmol/L — ABNORMAL LOW (ref 22–32)
pCO2 arterial: 33.7 mmHg (ref 32–48)
pH, Arterial: 7.386 (ref 7.35–7.45)
pO2, Arterial: 92 mmHg (ref 83–108)

## 2022-02-15 LAB — COOXEMETRY PANEL
Carboxyhemoglobin: 1.3 % (ref 0.5–1.5)
Methemoglobin: <0.7 % (ref 0.0–1.5)
O2 Saturation: 61.4 %
Total hemoglobin: 8.6 g/dL — ABNORMAL LOW (ref 12.0–16.0)

## 2022-02-15 LAB — MAGNESIUM
Magnesium: 2.6 mg/dL — ABNORMAL HIGH (ref 1.7–2.4)
Magnesium: 2.3 mg/dL (ref 1.7–2.4)

## 2022-02-15 LAB — SURGICAL PATHOLOGY

## 2022-02-15 MED ORDER — TRAMADOL HCL 50 MG PO TABS
50.0000 mg | ORAL_TABLET | ORAL | Status: DC | PRN
  Administered 2022-02-15 – 2022-02-17 (×8): 50 mg via ORAL
  Filled 2022-02-15 (×8): qty 1

## 2022-02-15 MED ORDER — INSULIN ASPART 100 UNIT/ML IJ SOLN
0.0000 [IU] | INTRAMUSCULAR | Status: DC
  Administered 2022-02-15: 8 [IU] via SUBCUTANEOUS
  Administered 2022-02-15 – 2022-02-16 (×3): 2 [IU] via SUBCUTANEOUS
  Administered 2022-02-16 (×2): 4 [IU] via SUBCUTANEOUS

## 2022-02-15 MED ORDER — INSULIN ASPART 100 UNIT/ML IJ SOLN: 0.0000 [IU] | INTRAMUSCULAR | Status: DC

## 2022-02-15 MED ORDER — INSULIN DETEMIR 100 UNIT/ML ~~LOC~~ SOLN
30.0000 [IU] | Freq: Every day | SUBCUTANEOUS | Status: DC
  Administered 2022-02-15 – 2022-02-16 (×2): 30 [IU] via SUBCUTANEOUS
  Filled 2022-02-15 (×3): qty 0.3

## 2022-02-15 MED ORDER — POTASSIUM CHLORIDE CRYS ER 20 MEQ PO TBCR
40.0000 meq | EXTENDED_RELEASE_TABLET | Freq: Once | ORAL | Status: AC
  Administered 2022-02-15: 40 meq via ORAL
  Filled 2022-02-15: qty 2

## 2022-02-15 MED ORDER — ENOXAPARIN SODIUM 40 MG/0.4ML IJ SOSY
40.0000 mg | PREFILLED_SYRINGE | Freq: Every day | INTRAMUSCULAR | Status: DC
  Administered 2022-02-15 – 2022-02-19 (×5): 40 mg via SUBCUTANEOUS
  Filled 2022-02-15 (×5): qty 0.4

## 2022-02-15 MED ORDER — AMLODIPINE BESYLATE 5 MG PO TABS
5.0000 mg | ORAL_TABLET | Freq: Every day | ORAL | Status: DC
  Administered 2022-02-15 – 2022-02-20 (×6): 5 mg via ORAL
  Filled 2022-02-15 (×6): qty 1

## 2022-02-15 MED ORDER — FUROSEMIDE 10 MG/ML IJ SOLN
40.0000 mg | Freq: Two times a day (BID) | INTRAMUSCULAR | Status: AC
  Administered 2022-02-15 (×2): 40 mg via INTRAVENOUS
  Filled 2022-02-15 (×2): qty 4

## 2022-02-15 MED ORDER — CARVEDILOL 12.5 MG PO TABS
12.5000 mg | ORAL_TABLET | Freq: Two times a day (BID) | ORAL | Status: DC
  Administered 2022-02-15 – 2022-02-16 (×3): 12.5 mg via ORAL
  Filled 2022-02-15 (×3): qty 1

## 2022-02-15 MED ORDER — POTASSIUM CHLORIDE CRYS ER 20 MEQ PO TBCR
20.0000 meq | EXTENDED_RELEASE_TABLET | Freq: Two times a day (BID) | ORAL | Status: AC
  Administered 2022-02-15 (×2): 20 meq via ORAL
  Filled 2022-02-15 (×2): qty 1

## 2022-02-15 MED FILL — Thrombin (Recombinant) For Soln 20000 Unit: CUTANEOUS | Qty: 1 | Status: AC

## 2022-02-15 NOTE — Progress Notes (Signed)
EVENING ROUNDS NOTE :     [address]       [place] [address]             [phone]                 1 Day Post-Op Procedure(s) (LRB): AORTIC VALVE REPLACEMENT (AVR) USING EDWARDS INSPIRIS 23 MM RESILIA AORTIC VALVE (N/A) REPLACEMENT ASCENDING AORTIC ANEURYSM HEMASHIELD PLATINUM 30 MM GRAFT (N/A) possible BENTALL PROCEDURE (N/A) TRANSESOPHAGEAL ECHOCARDIOGRAM (TEE) (N/A)   Total Length of Stay:  LOS: 1 day  Events:   No events Up to chair Visiting with family    BP 127/69 (BP Location: Left Arm)   Pulse 77   Temp 98.8 F (37.1 C)   Resp 18   Ht 5\' 6"  (1.676 m)   Wt 81.2 kg   LMP  (LMP Unknown)   SpO2 92%   BMI 28.89 kg/m   PAP: (14-30)/(1-13) 16/3 CO:  [2.8 L/min-4.3 L/min] 3.2 L/min CI:  [1.6 L/min/m2-2.4 L/min/m2] 1.8 L/min/m2  Vent Mode: SIMV;PRVC;PSV FiO2 (%):  [40 %-50 %] 40 % Set Rate:  [4 bmp] 4 bmp Vt Set:  [470 mL] 470 mL PEEP:  [5 cmH20] 5 cmH20 Pressure Support:  [10 cmH20] 10 cmH20 Plateau Pressure:  [12 cmH20] 12 cmH20    ceFAZolin (ANCEF) IV Stopped (02/15/22 1442)   clevidipine 3 mg/hr (02/15/22 1530)   lactated ringers 20 mL/hr at 02/15/22 1530   nitroGLYCERIN      I/O last 3 completed shifts: In: 6570.6 [P.O.:20; I.V.:3847.4; Blood:940; IV Piggyback:1763.2] Out: 3419 [Urine:2145; Blood:1339; Chest Tube:760]      Latest Ref Rng & Units 02/15/2022    4:37 AM 02/14/2022   11:15 PM 02/14/2022    9:27 PM  CBC  WBC 4.0 - 10.5 K/uL 16.8     Hemoglobin 12.0 - 15.0 g/dL 8.5  7.8  8.2   Hematocrit 36.0 - 46.0 % 24.7  23.0  24.0   Platelets 150 - 400 K/uL 113          Latest Ref Rng & Units 02/15/2022    4:37 AM 02/14/2022   11:15 PM 02/14/2022    9:27 PM  BMP  Glucose 70 - 99 mg/dL 149     BUN 8 - 23 mg/dL 20     Creatinine 0.44 - 1.00 mg/dL 1.05     Sodium 135 - 145 mmol/L 138  140  139   Potassium 3.5 - 5.1 mmol/L 3.8  3.9  4.1   Chloride 98 - 111 mmol/L 107     CO2 22 - 32 mmol/L 21     Calcium 8.9 - 10.3  mg/dL 8.5       ABG    Component Value Date/Time   PHART 7.386 02/14/2022 2315   PCO2ART 33.7 02/14/2022 2315   PO2ART 92 02/14/2022 2315   HCO3 20.2 02/14/2022 2315   TCO2 21 (L) 02/14/2022 2315   ACIDBASEDEF 4.0 (H) 02/14/2022 2315   O2SAT 61.4 02/15/2022 0438       Melodie Bouillon, MD 02/15/2022 3:58 PM

## 2022-02-15 NOTE — Progress Notes (Signed)
1 Day Post-Op Procedure(s) (LRB): AORTIC VALVE REPLACEMENT (AVR) USING EDWARDS INSPIRIS 23 MM RESILIA AORTIC VALVE (N/A) REPLACEMENT ASCENDING AORTIC ANEURYSM HEMASHIELD PLATINUM 30 MM GRAFT (N/A) possible BENTALL PROCEDURE (N/A) TRANSESOPHAGEAL ECHOCARDIOGRAM (TEE) (N/A) Subjective: Had a rough night with pain but feels ok this am. Extubated without difficulty.  Objective: Vital signs in last 24 hours: Temp:  [95.7 F (35.4 C)-99 F (37.2 C)] 98.6 F (37 C) (10/06 0700) Pulse Rate:  [66-90] 70 (10/06 0700) Cardiac Rhythm: Normal sinus rhythm (10/06 0400) Resp:  [10-23] 13 (10/06 0700) BP: (84-119)/(49-82) 100/64 (10/06 0700) SpO2:  [88 %-100 %] 94 % (10/06 0700) Arterial Line BP: (76-142)/(42-83) 107/47 (10/06 0700) FiO2 (%):  [40 %-50 %] 40 % (10/05 2030) Weight:  [81.2 kg] 81.2 kg (10/06 0500)  Hemodynamic parameters for last 24 hours: PAP: (12-36)/(2-20) 14/2 CO:  [2 L/min-4.3 L/min] 3.2 L/min CI:  [1.1 L/min/m2-2.4 L/min/m2] 1.8 L/min/m2  Intake/Output from previous day: 10/05 0701 - 10/06 0700 In: 6570.6 [P.O.:20; I.V.:3847.4; Blood:940; IV Piggyback:1763.2] Out: 2831 [Urine:2145; Blood:1339; Chest Tube:760] Intake/Output this shift: No intake/output data recorded.  General appearance: alert and cooperative Neurologic: intact Heart: regular rate and rhythm, S1, S2 normal, no murmur Lungs: clear to auscultation bilaterally Abdomen: soft, non-tender; bowel sounds normal Extremities: edema mild Wound: dressing dry  Lab Results: Recent Labs    02/14/22 1919 02/14/22 2000 02/14/22 2315 02/15/22 0437  WBC 16.5*  --   --  16.8*  HGB 9.4*   < > 7.8* 8.5*  HCT 28.7*   < > 23.0* 24.7*  PLT 123*  --   --  113*   < > = values in this interval not displayed.   BMET:  Recent Labs    02/14/22 1919 02/14/22 2000 02/14/22 2315 02/15/22 0437  NA 138   < > 140 138  K 4.4   < > 3.9 3.8  CL 108  --   --  107  CO2 22  --   --  21*  GLUCOSE 204*  --   --  149*  BUN  18  --   --  20  CREATININE 1.08*  --   --  1.05*  CALCIUM 7.9*  --   --  8.5*   < > = values in this interval not displayed.    PT/INR:  Recent Labs    02/14/22 1357  LABPROT 18.4*  INR 1.5*   ABG    Component Value Date/Time   PHART 7.386 02/14/2022 2315   HCO3 20.2 02/14/2022 2315   TCO2 21 (L) 02/14/2022 2315   ACIDBASEDEF 4.0 (H) 02/14/2022 2315   O2SAT 61.4 02/15/2022 0438   CBG (last 3)  Recent Labs    02/15/22 0444 02/15/22 0531 02/15/22 0631  GLUCAP 150* 169* 149*   CXR: clear  ECG: sinus rhythm, no acute changes  Assessment/Plan: S/P Procedure(s) (LRB): AORTIC VALVE REPLACEMENT (AVR) USING EDWARDS INSPIRIS 23 MM RESILIA AORTIC VALVE (N/A) REPLACEMENT ASCENDING AORTIC ANEURYSM HEMASHIELD PLATINUM 30 MM GRAFT (N/A) possible BENTALL PROCEDURE (N/A) TRANSESOPHAGEAL ECHOCARDIOGRAM (TEE) (N/A)  POD 1  Hemodynamically stable on cleviprex for BP control. Will resume Coreg and wean off cleviprex.  Volume excess: Wt is 16 lbs over preop. Start diuresis.  DM: glucose under good control. Preop Hgb A1c 5.4. Will transition to Levemir and SSI.  DC swan, arterial line.  Will keep chest tubes in this am and reassess later today.  IS, OOB.   LOS: 1 day    Gaye Pollack 02/15/2022

## 2022-02-15 NOTE — Discharge Summary (Signed)
[address]       [place] [address]             [phone]    Physician Discharge Summary  Patient ID: LILYANN GRAVELLE MRN: [id number] DOB/AGE: May 25, 1960 61 y.o.  Admit date: 02/14/2022 Discharge date: 02/18/2022  Admission Diagnoses:  Patient Active Problem List   Diagnosis Date Noted   Hypertensive urgency 01/17/2022   Near syncope 01/17/2022   Contact dermatitis and eczema due to plant 09/23/2021   Acute sinusitis 09/21/2021   RAS (renal artery stenosis) ([place]) 03/28/2021   Ankle injury, sequela 06/07/2020   Personal history of COVID-19 05/28/2020   Diabetic nephropathy associated with type 2 diabetes mellitus ([place]) 10/21/2019   Renal artery stenosis ([place]) 10/27/2018   Hypertensive emergency    Thoracic aortic aneurysm without rupture ([place])    Chest pain 03/10/2018   Paroxysmal tachycardia ([place]) 08/12/2017   Thyroid nodule 05/15/2017   Ascending aorta dilatation ([place]) 05/27/2016   Bicuspid aortic valve 05/27/2016   Encounter for preventive health examination 10/29/2015   Adjustment disorder with anxious mood 06/13/2014   DM type 2 with diabetic mixed hyperlipidemia ([place]) 03/25/2012   Aortic stenosis    Screening for colon cancer 08/25/2011   Screening for breast cancer 08/25/2011   Overweight 08/25/2011   Hyperlipidemia associated with type 2 diabetes mellitus ([place]) 08/25/2011   Cervical spine disease 06/18/2011   Hypertension 06/18/2011     Discharge Diagnoses:  Patient Active Problem List   Diagnosis Date Noted   S/P AVR (aortic valve replacement) 02/14/2022   Hypertensive urgency 01/17/2022   Near syncope 01/17/2022   Contact dermatitis and eczema due to plant 09/23/2021   Acute sinusitis 09/21/2021   RAS (renal artery stenosis) ([place]) 03/28/2021   Ankle injury, sequela 06/07/2020   Personal history of COVID-19 05/28/2020   Diabetic nephropathy associated with type 2 diabetes mellitus ([place]) 10/21/2019   Renal artery stenosis  ([place]) 10/27/2018   Hypertensive emergency    Thoracic aortic aneurysm without rupture ([place])    Chest pain 03/10/2018   Paroxysmal tachycardia ([place]) 08/12/2017   Thyroid nodule 05/15/2017   Ascending aorta dilatation ([place]) 05/27/2016   Bicuspid aortic valve 05/27/2016   Encounter for preventive health examination 10/29/2015   Adjustment disorder with anxious mood 06/13/2014   DM type 2 with diabetic mixed hyperlipidemia ([place]) 03/25/2012   Aortic stenosis    Screening for colon cancer 08/25/2011   Screening for breast cancer 08/25/2011   Overweight 08/25/2011   Hyperlipidemia associated with type 2 diabetes mellitus ([place]) 08/25/2011   Cervical spine disease 06/18/2011   Hypertension 06/18/2011     Discharged Condition: {condition:18240}         [address]       [place] [address]             [phone]                             Cardiothoracic Surgery Admission History and Physical     PCP is Derrel Nip Aris Everts, MD Referring Provider is Crecencio Mc, MD          Chief Complaint  Patient presents with   Thoracic Aortic Aneurysm      Initial surgical consult, CTA chest 8/9      HPI:   The patient is a 61 year old woman with a history of hypertension, diabetes, renal artery stenosis felt to be  secondary to fibromuscular dysplasia, bicuspid aortic valve disease, and an ascending aortic aneurysm that has been followed with periodic echocardiogram and CTA chest.  Her echocardiogram in August 2021 was read as showing a trileaflet aortic valve with moderate aortic stenosis with a mean gradient of 22.5 mmHg.  Aortic valve area by VTI was 1.09 cm with a dimensionless index of 0.32.  Left ventricular ejection fraction was 60 to 65% with mild LVH and grade 1 diastolic dysfunction. CTA of the chest on 12/19/2020 showed a stable fusiform ascending aortic aneurysm with maximum diameter of 5 cm.  The previous scan in 2020 showed the diameter to be 4.9 cm.   When I  first saw her on 01/01/2021 she had class II symptoms of exertional fatigue and shortness of breath.  I recommended aortic valve replacement and supra coronary replacement of her ascending aorta at that time but she was following up with Dr. Rockey Situ for next week and wanted to discuss it further with him.  She has continued to work as an AD at [employer].  She has developed worsening exertional shortness of breath and fatigue as well as episodes of dizziness with minimal exertion.  She had a follow-up echocardiogram on 12/25/2021 that showed a mean gradient of 23 mmHg and a peak gradient of 41 mmHg with a valve area of 0.68 cm.  Her mean gradient a year ago was 31 mmHg.  Her stroke-volume index has decreased to 26 with a left ventricular ejection fraction of 60 to 65%.  She has moderate asymmetric LVH of the basal-septal segment.     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  She is married and lives with her husband.  She reports that over the past year she has developed exertional fatigue and shortness of breath.  She has found that after she works 3 days in a row that she is completely exhausted and has to spend the fourth day laying around not doing anything.  She has had some dizziness but no syncope. She has had lower extremity edema.  She denies PND or orthopnea. She denies any chest pain or pressure.  Dr. Cyndia Bent reviewed the patient's chart, labs and diagnostic studies and determined surgical intervention would provide Ms. Elvin the best long term treatment. He discussed the patient's treatment options as well as the risks and benefits. After careful consideration Asmara Leng was agreeable to proceed with aortic valve replacement and ascending aorta replacement.   Hospital Course: Ms. Flamand arrived at [facility] and was brought to the operating room on 02/14/22. She underwent an aortic valve replacement with a 32mm  Edwards Inspiris Resilia bioprosthetic valve and an ascending aorta replacement, she tolerated the procedure well and was transferred to the SICU in stable condition. She was extubated the night of surgery without complication. Drips were weaned as tolerated. Low dose Coreg was started and titrated as able. Swan ganz catheter and arterial line was removed without complication. She was volume overloaded so diuresis was started. She has a history of T2DM, preop A1C was  5.4. CBGs were controlled, she was transitioned to SSI and levemir.  She was mobilized while in the ICU and progressed well with ambulation.  Hypertension early postoperatively was managed with IV Cleviprex.  She was later transitioned back to her home antihypertensive medications.  Diuresis was begun on postop day 2 for expected volume excess.  Chest tubes were removed on postop day 3  after drainage tapered off.  Levemir dosing was adjusted for hypoglycemia.  This was persisted and Levemir was discontinued.  She was maintaining NSR and was stable for transfer to the progressive care unit on 02/18/2022.       Consults: {consultation:18241}  Significant Diagnostic Studies:      ECHOCARDIOGRAM REPORT         Patient Name:   SAVANNA HANDLIN Date of Exam: 12/25/2021  Medical Rec #:  [id number]            Height:       66.0 in  Accession #:    [id number]           Weight:       166.4 lb  Date of Birth:  11-04-1960             BSA:          1.849 m  Patient Age:    64 years             BP:           140/84 mmHg  Patient Gender: F                    HR:           74 bpm.  Exam Location:  [facility]   Procedure: 2D Echo, Cardiac Doppler, Color Doppler and Intracardiac             Opacification Agent   Indications:    I35.0 Nonrheumatic aortic (valve) stenosis     History:        Patient has prior history of Echocardiogram examinations,  most                  recent 01/03/2021. Dilated aorta, RAS, Aortic Valve  Disease,                   Signs/Symptoms:Murmur, Chest Pain and  Dizziness/Lightheadedness;                  Risk Factors:Hypertension, Diabetes, Dyslipidemia and                  Non-Smoker.     Sonographer:    Pilar Jarvis RDMS, RVT, RDCS  Referring Phys: [place]     1. Left ventricular ejection fraction, by estimation, is 60 to 65%. The  left ventricle has normal function. The left ventricle has no regional  wall motion abnormalities. There is moderate asymmetric left ventricular  hypertrophy of the basal-septal  segment. Left ventricular diastolic parameters are consistent with Grade I  diastolic dysfunction (impaired relaxation).   2. Right ventricular systolic function is normal. The right ventricular  size is normal.   3. The mitral valve is normal in structure. Mild mitral valve  regurgitation. No evidence of mitral stenosis.   4. The aortic valve has moderate calcification. Aortic valve  regurgitation is not visualized. Moderate aortic valve stenosis by mean  gradient measures 23.2 mmHg. Aortic valve Vmax measures 3.22 m/s. Severe  stenosis by aortic valve area, by VTI measures  0.68 cm sq   5. There is borderline dilatation of the aortic root, measuring 37 mm.  There is moderate dilatation of the ascending aorta, measuring 45 mm.   6. The inferior vena cava is normal in size with greater than 50%  respiratory variability, suggesting right atrial pressure of 3 mmHg.   FINDINGS   Left Ventricle:  Left ventricular ejection fraction, by estimation, is 60  to 65%. The left ventricle has normal function. The left ventricle has no  regional wall motion abnormalities. Definity contrast agent was given IV  to delineate the left ventricular   endocardial borders. The left ventricular internal cavity size was normal  in size. There is moderate asymmetric left ventricular hypertrophy of the  basal-septal segment. Left ventricular diastolic parameters are consistent  with  Grade I diastolic  dysfunction (impaired relaxation).   Right Ventricle: The right ventricular size is normal. No increase in  right ventricular wall thickness. Right ventricular systolic function is  normal.   Left Atrium: Left atrial size was normal in size.   Right Atrium: Right atrial size was normal in size.   Pericardium: There is no evidence of pericardial effusion.   Mitral Valve: The mitral valve is normal in structure. Mild mitral annular  calcification. Mild mitral valve regurgitation. No evidence of mitral  valve stenosis.   Tricuspid Valve: The tricuspid valve is normal in structure. Tricuspid  valve regurgitation is mild . No evidence of tricuspid stenosis.   Aortic Valve: The aortic valve is normal in structure. Aortic valve  regurgitation is not visualized. Moderate aortic stenosis is present.  Aortic valve mean gradient measures 23.2 mmHg. Aortic valve peak gradient  measures 41.3 mmHg. Aortic valve area, by  VTI measures 0.68 cm.   Pulmonic Valve: The pulmonic valve was normal in structure. Pulmonic valve  regurgitation is not visualized. No evidence of pulmonic stenosis.   Aorta: The aortic root is normal in size and structure. There is  borderline dilatation of the aortic root, measuring 37 mm. There is  moderate dilatation of the ascending aorta, measuring 45 mm.   Venous: The inferior vena cava is normal in size with greater than 50%  respiratory variability, suggesting right atrial pressure of 3 mmHg.   IAS/Shunts: No atrial level shunt detected by color flow Doppler.      LEFT VENTRICLE  PLAX 2D  LVIDd:         3.70 cm   Diastology  LVIDs:         2.50 cm   LV e' medial:    3.49 cm/s  LV PW:         1.30 cm   LV E/e' medial:  21.6  LV IVS:        1.50 cm   LV e' lateral:   3.89 cm/s  LVOT diam:     1.90 cm   LV E/e' lateral: 19.4  LV SV:         48  LV SV Index:   26  LVOT Area:     2.84 cm      RIGHT VENTRICLE  RV Basal diam:  3.50 cm   RV S prime:     11.10 cm/s  TAPSE (M-mode): 2.5 cm   LEFT ATRIUM             Index        RIGHT ATRIUM           Index  LA diam:        3.40 cm 1.84 cm/m   RA Area:     13.30 cm  LA Vol (A2C):   57.8 ml 31.25 ml/m  RA Volume:   31.50 ml  17.03 ml/m  LA Vol (A4C):   39.6 ml 21.41 ml/m  LA Biplane Vol: 48.9 ml 26.44 ml/m   AORTIC VALVE  PULMONIC VALVE  AV Area (Vmax):    0.77 cm      PV Vmax:       0.88 m/s  AV Area (Vmean):   0.72 cm      PV Peak grad:  3.1 mmHg  AV Area (VTI):     0.68 cm  AV Vmax:           321.50 cm/s  AV Vmean:          233.000 cm/s  AV VTI:            0.711 m  AV Peak Grad:      41.3 mmHg  AV Mean Grad:      23.2 mmHg  LVOT Vmax:         86.80 cm/s  LVOT Vmean:        59.400 cm/s  LVOT VTI:          0.170 m  LVOT/AV VTI ratio: 0.24     AORTA  Ao Root diam: 3.70 cm  Ao Asc diam:  4.65 cm  Ao Arch diam: 3.0 cm   MITRAL VALVE                TRICUSPID VALVE  MV Area (PHT): 1.97 cm     TR Peak grad:   33.2 mmHg  MV Decel Time: 385 msec     TR Vmax:        288.00 cm/s  MV E velocity: 75.40 cm/s  MV A velocity: 120.00 cm/s  SHUNTS  MV E/A ratio:  0.63         Systemic VTI:  0.17 m                              Systemic Diam: 1.90 cm   Ida Rogue MD  Electronically signed by Ida Rogue MD  Signature Date/Time: 12/25/2021/1:00:11 PM         Final    Treatments: surgery:   CARDIOVASCULAR SURGERY OPERATIVE NOTE   02/14/2022   Surgeon:  Gaye Pollack, MD   First Assistant: Wynelle Beckmann,  PA-C: An experienced assistant was required given the complexity of this surgery and the standard of surgical care. The assistant was needed for exposure, dissection, suctioning, retraction of delicate tissues and sutures, instrument exchange and for overall help during this procedure.      Preoperative Diagnosis:  Severe bicuspid aortic valve stenosis and ascending aortic aneurysm     Postoperative Diagnosis:  Same      Procedure:   Median Sternotomy Extracorporeal circulation 3.   Supra-coronary replacement of the ascending aorta (hemi-arch) using a 30 mm Hemashield graft under deep hypothermic circulatory arrest 4.   Aortic valve replacement using a 23 mm Edwards INSPIRIS RESILIA pericardial valve.     Discharge Exam: Blood pressure 122/66, pulse 83, temperature 97.6 F (36.4 C), temperature source Oral, resp. rate 14, height 5\' 6"  (1.676 m), weight 78.1 kg, SpO2 99 %. {physical [id number]   Discharge Medications:  The patient has been discharged on:   1.Beta Blocker:  Yes [   ]                              No   [   ]  If No, reason:  2.Ace Inhibitor/ARB: Yes [   ]                                     No  [    ]                                     If No, reason:  3.Statin:   Yes [   ]                  No  [   ]                  If No, reason:  4.Ecasa:  Yes  [   ]                  No   [   ]                  If No, reason:  Patient had ACS upon admission: NO  Plavix/P2Y12 inhibitor: Yes [   ]                                      No  [   ]      Allergies as of 02/18/2022       Reactions   Hctz [hydrochlorothiazide] Other (See Comments)   Decreased gfr   Prozac [fluoxetine Hcl] Palpitations     Med Rec must be completed prior to using this [facility]***       Follow-up Information     Gaye Pollack, MD Follow up on 03/13/2022.   Specialty: Cardiothoracic Surgery Why: To get PA/LAT CXR at 11:00AM, 30 minutes before your appointment at 11:30AM (CXR at [facility] on the 1st floor of Dr. Vivi Martens office building) Contact information: [place] [address] [phone]         [facility] Follow up on 02/25/2022.   Specialty: Cardiothoracic Surgery Why: Nurse visit for chest tube suture removal at 11:00AM Contact information: [place], [place] [phone]                Signed:  Magdalene River, PA-C  02/18/2022, 11:10 AM

## 2022-02-15 NOTE — Discharge Instructions (Signed)

## 2022-02-15 NOTE — Progress Notes (Signed)
Blood gas at 2000 was inaccurate. Hgb -5.4 did not match with CBC drawn at [facility].

## 2022-02-16 ENCOUNTER — Inpatient Hospital Stay (HOSPITAL_COMMUNITY): Payer: 59

## 2022-02-16 LAB — BASIC METABOLIC PANEL
Anion gap: 11 (ref 5–15)
BUN: 18 mg/dL (ref 8–23)
CO2: 23 mmol/L (ref 22–32)
Calcium: 8.3 mg/dL — ABNORMAL LOW (ref 8.9–10.3)
Chloride: 104 mmol/L (ref 98–111)
Creatinine, Ser: 1.11 mg/dL — ABNORMAL HIGH (ref 0.44–1.00)
GFR, Estimated: 56 mL/min — ABNORMAL LOW (ref >60)
Glucose, Bld: 112 mg/dL — ABNORMAL HIGH (ref 70–99)
Potassium: 3.8 mmol/L (ref 3.5–5.1)
Sodium: 138 mmol/L (ref 135–145)

## 2022-02-16 LAB — CBC
HCT: 26.6 % — ABNORMAL LOW (ref 36.0–46.0)
Hemoglobin: 8.8 g/dL — ABNORMAL LOW (ref 12.0–15.0)
MCH: 29 pg (ref 26.0–34.0)
MCHC: 33.1 g/dL (ref 30.0–36.0)
MCV: 87.8 fL (ref 80.0–100.0)
Platelets: 168 10*3/uL (ref 150–400)
RBC: 3.03 MIL/uL — ABNORMAL LOW (ref 3.87–5.11)
RDW: 14.4 % (ref 11.5–15.5)
WBC: 23.5 10*3/uL — ABNORMAL HIGH (ref 4.0–10.5)
nRBC: 0 % (ref 0.0–0.2)

## 2022-02-16 LAB — GLUCOSE, CAPILLARY
Glucose-Capillary: 113 mg/dL — ABNORMAL HIGH (ref 70–99)
Glucose-Capillary: 150 mg/dL — ABNORMAL HIGH (ref 70–99)
Glucose-Capillary: 162 mg/dL — ABNORMAL HIGH (ref 70–99)
Glucose-Capillary: 90 mg/dL (ref 70–99)
Glucose-Capillary: 127 mg/dL — ABNORMAL HIGH (ref 70–99)

## 2022-02-16 MED ORDER — CARVEDILOL 25 MG PO TABS
25.0000 mg | ORAL_TABLET | Freq: Two times a day (BID) | ORAL | Status: DC
  Administered 2022-02-16 – 2022-02-20 (×8): 25 mg via ORAL
  Filled 2022-02-16 (×8): qty 1

## 2022-02-16 MED ORDER — B COMPLEX-C PO TABS
1.0000 | ORAL_TABLET | Freq: Two times a day (BID) | ORAL | Status: DC
  Administered 2022-02-16 – 2022-02-20 (×9): 1 via ORAL
  Filled 2022-02-16 (×9): qty 1

## 2022-02-16 MED ORDER — FUROSEMIDE 10 MG/ML IJ SOLN
40.0000 mg | Freq: Every day | INTRAMUSCULAR | Status: AC
  Administered 2022-02-16 – 2022-02-17 (×2): 40 mg via INTRAVENOUS
  Filled 2022-02-16 (×2): qty 4

## 2022-02-16 MED ORDER — POTASSIUM CHLORIDE CRYS ER 20 MEQ PO TBCR
20.0000 meq | EXTENDED_RELEASE_TABLET | Freq: Three times a day (TID) | ORAL | Status: AC
  Administered 2022-02-16 (×3): 20 meq via ORAL
  Filled 2022-02-16 (×3): qty 1

## 2022-02-16 MED ORDER — POTASSIUM CHLORIDE CRYS ER 20 MEQ PO TBCR: 40.0000 meq | EXTENDED_RELEASE_TABLET | Freq: Every day | ORAL | Status: DC

## 2022-02-16 MED ORDER — POTASSIUM CHLORIDE CRYS ER 20 MEQ PO TBCR
40.0000 meq | EXTENDED_RELEASE_TABLET | Freq: Every day | ORAL | Status: DC
  Administered 2022-02-17: 40 meq via ORAL
  Filled 2022-02-16: qty 2

## 2022-02-16 MED ORDER — FE FUMARATE-B12-VIT C-FA-IFC PO CAPS
1.0000 | ORAL_CAPSULE | Freq: Two times a day (BID) | ORAL | Status: DC
  Filled 2022-02-16: qty 1

## 2022-02-16 MED ORDER — FERROUS FUMARATE 324 (106 FE) MG PO TABS
1.0000 | ORAL_TABLET | Freq: Two times a day (BID) | ORAL | Status: DC
  Administered 2022-02-16 – 2022-02-20 (×9): 106 mg via ORAL
  Filled 2022-02-16 (×9): qty 1

## 2022-02-16 MED ORDER — IRBESARTAN 150 MG PO TABS
150.0000 mg | ORAL_TABLET | Freq: Every day | ORAL | Status: DC
  Administered 2022-02-16 – 2022-02-18 (×3): 150 mg via ORAL
  Filled 2022-02-16 (×3): qty 1

## 2022-02-16 NOTE — Progress Notes (Signed)
[address]       [place] [address]             [phone]                 2 Days Post-Op Procedure(s) (LRB): AORTIC VALVE REPLACEMENT (AVR) USING EDWARDS INSPIRIS 23 MM RESILIA AORTIC VALVE (N/A) REPLACEMENT ASCENDING AORTIC ANEURYSM HEMASHIELD PLATINUM 30 MM GRAFT (N/A) possible BENTALL PROCEDURE (N/A) TRANSESOPHAGEAL ECHOCARDIOGRAM (TEE) (N/A)   Events: No events Ambulated this am _______________________________________________________________ Vitals: BP 129/66 (BP Location: Left Arm)   Pulse 83   Temp 98.2 F (36.8 C) (Oral)   Resp (!) 22   Ht 5\' 6"  (1.676 m)   Wt 77.1 kg   LMP  (LMP Unknown)   SpO2 96%   BMI 27.43 kg/m  Filed Weights   02/14/22 0549 02/15/22 0500 02/16/22 0500  Weight: 73.9 kg 81.2 kg 77.1 kg     - Neuro: alert NAd  - Cardiovascular: sinus  Drips: claviprex.      - Pulm12/07/23, on HFNC    ABG    Component Value Date/Time   PHART 7.386 02/14/2022 2315   PCO2ART 33.7 02/14/2022 2315   PO2ART 92 02/14/2022 2315   HCO3 20.2 02/14/2022 2315   TCO2 21 (L) 02/14/2022 2315   ACIDBASEDEF 4.0 (H) 02/14/2022 2315   O2SAT 61.4 02/15/2022 0438    - Abd: ND - Extremity: trace edema  .Intake/Output      10/06 0701 10/07 0700 10/07 0701 10/08 0700   P.O. 960    I.V. (mL/kg) 970 (12.6) 39 (0.5)   Blood     IV Piggyback 100    Total Intake(mL/kg) 2030 (26.3) 39 (0.5)   Urine (mL/kg/hr) 6080 (3.3) 100 (0.8)   Blood     Chest Tube 715    Total Output 6795 100   Net -4765.1 -61           _______________________________________________________________ Labs:    Latest Ref Rng & Units 02/16/2022    4:27 AM 02/15/2022    5:21 PM 02/15/2022    4:37 AM  CBC  WBC 4.0 - 10.5 K/uL 23.5  21.9  16.8   Hemoglobin 12.0 - 15.0 g/dL 8.8  8.3  8.5   Hematocrit 36.0 - 46.0 % 26.6  24.5  24.7   Platelets 150 - 400 K/uL 168  153  113       Latest Ref Rng & Units 02/16/2022    4:27 AM 02/15/2022    5:21 PM 02/15/2022     4:37 AM  CMP  Glucose 70 - 99 mg/dL 04/17/2022  469  629   BUN 8 - 23 mg/dL 18  17  20    Creatinine 0.44 - 1.00 mg/dL 528   4.13   Sodium 135 - 145 mmol/L 138  135  138   Potassium 3.5 - 5.1 mmol/L 3.8  3.9  3.8   Chloride 98 - 111 mmol/L 104  104  107   CO2 22 - 32 mmol/L 23  23  21    Calcium 8.9 - 10.3 mg/dL 8.3  8.2  8.5     CXR: PV congestion  _______________________________________________________________  Assessment and Plan: POD 2 s/p AVR, Hemiarch  Neuro: pain controlled CV: restarting home meds, weaning cleviprex Pulm: diuresing.  Will keep tubes given output Renal: lasix today.  Creat stable GI: on diet Heme: stable ID: afebrile, leukocytosis.  CXR clear.  Will follow for now Endo:  SSI Dispo: continue ICU care   Lajuana Matte 02/16/2022 8:36 AM

## 2022-02-16 NOTE — Progress Notes (Signed)
EVENING ROUNDS NOTE :     [address]       [place] [address]             [phone]                 2 Days Post-Op Procedure(s) (LRB): AORTIC VALVE REPLACEMENT (AVR) USING EDWARDS INSPIRIS 23 MM RESILIA AORTIC VALVE (N/A) REPLACEMENT ASCENDING AORTIC ANEURYSM HEMASHIELD PLATINUM 30 MM GRAFT (N/A) possible BENTALL PROCEDURE (N/A) TRANSESOPHAGEAL ECHOCARDIOGRAM (TEE) (N/A)   Total Length of Stay:  LOS: 2 days  Events:   BP better controlled    BP 96/65   Pulse 71   Temp 99.2 F (37.3 C) (Oral)   Resp (!) 22   Ht 5\' 6"  (1.676 m)   Wt 77.1 kg   LMP  (LMP Unknown)   SpO2 96%   BMI 27.43 kg/m          clevidipine 4 mg/hr (02/16/22 1900)   lactated ringers 20 mL/hr at 02/16/22 1900   nitroGLYCERIN      I/O last 3 completed shifts: In: 2470.7 [P.O.:960; I.V.:1410.7; IV Piggyback:100] Out: 9110 [Urine:8165; Chest Tube:945]      Latest Ref Rng & Units 02/16/2022    4:27 AM 02/15/2022    5:21 PM 02/15/2022    4:37 AM  CBC  WBC 4.0 - 10.5 K/uL 23.5  21.9  16.8   Hemoglobin 12.0 - 15.0 g/dL 8.8  8.3  8.5   Hematocrit 36.0 - 46.0 % 26.6  24.5  24.7   Platelets 150 - 400 K/uL 168  153  113        Latest Ref Rng & Units 02/16/2022    4:27 AM 02/15/2022    5:21 PM 02/15/2022    4:37 AM  BMP  Glucose 70 - 99 mg/dL 112  122  149   BUN 8 - 23 mg/dL 18  17  20    Creatinine 0.44 - 1.00 mg/dL 1.11  1.25  1.05   Sodium 135 - 145 mmol/L 138  135  138   Potassium 3.5 - 5.1 mmol/L 3.8  3.9  3.8   Chloride 98 - 111 mmol/L 104  104  107   CO2 22 - 32 mmol/L 23  23  21    Calcium 8.9 - 10.3 mg/dL 8.3  8.2  8.5     ABG    Component Value Date/Time   PHART 7.386 02/14/2022 2315   PCO2ART 33.7 02/14/2022 2315   PO2ART 92 02/14/2022 2315   HCO3 20.2 02/14/2022 2315   TCO2 21 (L) 02/14/2022 2315   ACIDBASEDEF 4.0 (H) 02/14/2022 2315   O2SAT 61.4 02/15/2022 0438       Courtney Bouillon, MD 02/16/2022 7:31 PM

## 2022-02-17 LAB — BASIC METABOLIC PANEL
Anion gap: 10 (ref 5–15)
BUN: 17 mg/dL (ref 8–23)
CO2: 27 mmol/L (ref 22–32)
Calcium: 8.7 mg/dL — ABNORMAL LOW (ref 8.9–10.3)
Chloride: 101 mmol/L (ref 98–111)
Creatinine, Ser: 0.85 mg/dL (ref 0.44–1.00)
GFR, Estimated: >60 mL/min (ref >60)
Glucose, Bld: 62 mg/dL — ABNORMAL LOW (ref 70–99)
Potassium: 3.7 mmol/L (ref 3.5–5.1)
Sodium: 138 mmol/L (ref 135–145)

## 2022-02-17 LAB — GLUCOSE, CAPILLARY
Glucose-Capillary: 130 mg/dL — ABNORMAL HIGH (ref 70–99)
Glucose-Capillary: 134 mg/dL — ABNORMAL HIGH (ref 70–99)
Glucose-Capillary: 139 mg/dL — ABNORMAL HIGH (ref 70–99)
Glucose-Capillary: 140 mg/dL — ABNORMAL HIGH (ref 70–99)
Glucose-Capillary: 179 mg/dL — ABNORMAL HIGH (ref 70–99)
Glucose-Capillary: 226 mg/dL — ABNORMAL HIGH (ref 70–99)
Glucose-Capillary: 45 mg/dL — ABNORMAL LOW (ref 70–99)
Glucose-Capillary: 47 mg/dL — ABNORMAL LOW (ref 70–99)
Glucose-Capillary: 83 mg/dL (ref 70–99)
Glucose-Capillary: 97 mg/dL (ref 70–99)
Glucose-Capillary: 99 mg/dL (ref 70–99)

## 2022-02-17 LAB — CBC
HCT: 25.1 % — ABNORMAL LOW (ref 36.0–46.0)
Hemoglobin: 8.4 g/dL — ABNORMAL LOW (ref 12.0–15.0)
MCH: 29.5 pg (ref 26.0–34.0)
MCHC: 33.5 g/dL (ref 30.0–36.0)
MCV: 88.1 fL (ref 80.0–100.0)
Platelets: 133 10*3/uL — ABNORMAL LOW (ref 150–400)
RBC: 2.85 MIL/uL — ABNORMAL LOW (ref 3.87–5.11)
RDW: 14.4 % (ref 11.5–15.5)
WBC: 13 10*3/uL — ABNORMAL HIGH (ref 4.0–10.5)
nRBC: 0 % (ref 0.0–0.2)

## 2022-02-17 MED ORDER — FUROSEMIDE 40 MG PO TABS: 40.0000 mg | ORAL_TABLET | Freq: Every day | ORAL | Status: DC

## 2022-02-17 MED ORDER — INSULIN DETEMIR 100 UNIT/ML ~~LOC~~ SOLN
25.0000 [IU] | Freq: Every day | SUBCUTANEOUS | Status: DC
  Administered 2022-02-17: 25 [IU] via SUBCUTANEOUS
  Filled 2022-02-17 (×2): qty 0.25

## 2022-02-17 MED ORDER — INSULIN DETEMIR 100 UNIT/ML ~~LOC~~ SOLN: 25.0000 [IU] | Freq: Every day | SUBCUTANEOUS | Status: DC

## 2022-02-17 MED ORDER — INSULIN ASPART 100 UNIT/ML IJ SOLN
0.0000 [IU] | Freq: Three times a day (TID) | INTRAMUSCULAR | Status: DC
  Administered 2022-02-17 (×2): 2 [IU] via SUBCUTANEOUS

## 2022-02-17 NOTE — Progress Notes (Signed)
[address]       ,[place] [address]             [phone]                 3 Days Post-Op Procedure(s) (LRB): AORTIC VALVE REPLACEMENT (AVR) USING EDWARDS INSPIRIS 23 MM RESILIA AORTIC VALVE (N/A) REPLACEMENT ASCENDING AORTIC ANEURYSM HEMASHIELD PLATINUM 30 MM GRAFT (N/A) possible BENTALL PROCEDURE (N/A) TRANSESOPHAGEAL ECHOCARDIOGRAM (TEE) (N/A)   Events: No events Hypoglycemia overnight _______________________________________________________________ Vitals: BP 102/61   Pulse 64   Temp 98.6 F (37 C) (Oral)   Resp 14   Ht 5\' 6"  (1.676 m)   Wt 77.1 kg   LMP  (LMP Unknown)   SpO2 94%   BMI 27.43 kg/m  Filed Weights   02/14/22 0549 02/15/22 0500 02/16/22 0500  Weight: 73.9 kg 81.2 kg 77.1 kg     - Neuro: alert NAd  - Cardiovascular: sinus  Drips: claviprex.      - PulmMarzetta Merino,.  SS CT output    ABG    Component Value Date/Time   PHART 7.386 02/14/2022 2315   PCO2ART 33.7 02/14/2022 2315   PO2ART 92 02/14/2022 2315   HCO3 20.2 02/14/2022 2315   TCO2 21 (L) 02/14/2022 2315   ACIDBASEDEF 4.0 (H) 02/14/2022 2315   O2SAT 61.4 02/15/2022 0438    - Abd: ND - Extremity: trace edema  .Intake/Output      10/07 0701 10/08 0700 10/08 0701 10/09 0700   P.O. 480    I.V. (mL/kg) 735.1 (9.5)    IV Piggyback     Total Intake(mL/kg) 1215.1 (15.8)    Urine (mL/kg/hr) 2085 (1.1)    Chest Tube 360    Total Output 2445    Net -1230         Urine Occurrence 2 x       _______________________________________________________________ Labs:    Latest Ref Rng & Units 02/17/2022    4:38 AM 02/16/2022    4:27 AM 02/15/2022    5:21 PM  CBC  WBC 4.0 - 10.5 K/uL 13.0  23.5  21.9   Hemoglobin 12.0 - 15.0 g/dL 8.4  8.8  8.3   Hematocrit 36.0 - 46.0 % 25.1  26.6  24.5   Platelets 150 - 400 K/uL 133  168  153       Latest Ref Rng & Units 02/17/2022    4:38 AM 02/16/2022    4:27 AM 02/15/2022    5:21 PM  CMP  Glucose 70 - 99 mg/dL 62  112   122   BUN 8 - 23 mg/dL 17  18  17    Creatinine 0.44 - 1.00 mg/dL 0.85  1.11  1.25   Sodium 135 - 145 mmol/L 138  138  135   Potassium 3.5 - 5.1 mmol/L 3.7  3.8  3.9   Chloride 98 - 111 mmol/L 101  104  104   CO2 22 - 32 mmol/L 27  23  23    Calcium 8.9 - 10.3 mg/dL 8.7  8.3  8.2     CXR: PV congestion  _______________________________________________________________  Assessment and Plan: POD 3 s/p AVR, Hemiarch  Neuro: pain controlled CV: good BP.  Will d/c gtt. Pulm: diuresing.  Will remove CT later today Renal: lasix today.  Creat stable GI: on diet Heme: stable ID: afebrile, leukocytosis.improved Endo: SSI, decreasing levemir Dispo: continue ICU care   Lajuana Matte 02/17/2022 10:16 AM

## 2022-02-17 NOTE — Progress Notes (Signed)
EVENING ROUNDS NOTE :     [address]       [place] [address]             [phone]                 3 Days Post-Op Procedure(s) (LRB): AORTIC VALVE REPLACEMENT (AVR) USING EDWARDS INSPIRIS 23 MM RESILIA AORTIC VALVE (N/A) REPLACEMENT ASCENDING AORTIC ANEURYSM HEMASHIELD PLATINUM 30 MM GRAFT (N/A) possible BENTALL PROCEDURE (N/A) TRANSESOPHAGEAL ECHOCARDIOGRAM (TEE) (N/A)   Total Length of Stay:  LOS: 3 days  Events:   No events Stable BP    BP 117/77   Pulse 83   Temp 97.6 F (36.4 C) (Oral)   Resp 18   Ht 5\' 6"  (1.676 m)   Wt 77.1 kg   LMP  (LMP Unknown)   SpO2 98%   BMI 27.43 kg/m          lactated ringers 20 mL/hr at 02/17/22 1158    I/O last 3 completed shifts: In: 1347.8 [P.O.:480; I.V.:867.8] Out: 4105 [Urine:3585; Chest Tube:520]      Latest Ref Rng & Units 02/17/2022    4:38 AM 02/16/2022    4:27 AM 02/15/2022    5:21 PM  CBC  WBC 4.0 - 10.5 K/uL 13.0  23.5  21.9   Hemoglobin 12.0 - 15.0 g/dL 8.4  8.8  8.3   Hematocrit 36.0 - 46.0 % 25.1  26.6  24.5   Platelets 150 - 400 K/uL 133  168  153        Latest Ref Rng & Units 02/17/2022    4:38 AM 02/16/2022    4:27 AM 02/15/2022    5:21 PM  BMP  Glucose 70 - 99 mg/dL 62  112  122   BUN 8 - 23 mg/dL 17  18  17    Creatinine 0.44 - 1.00 mg/dL 0.85  1.11  1.25   Sodium 135 - 145 mmol/L 138  138  135   Potassium 3.5 - 5.1 mmol/L 3.7  3.8  3.9   Chloride 98 - 111 mmol/L 101  104  104   CO2 22 - 32 mmol/L 27  23  23    Calcium 8.9 - 10.3 mg/dL 8.7  8.3  8.2     ABG    Component Value Date/Time   PHART 7.386 02/14/2022 2315   PCO2ART 33.7 02/14/2022 2315   PO2ART 92 02/14/2022 2315   HCO3 20.2 02/14/2022 2315   TCO2 21 (L) 02/14/2022 2315   ACIDBASEDEF 4.0 (H) 02/14/2022 2315   O2SAT 61.4 02/15/2022 0438       Melodie Bouillon, MD 02/17/2022 8:15 PM

## 2022-02-18 LAB — CBC
HCT: 25.6 % — ABNORMAL LOW (ref 36.0–46.0)
Hemoglobin: 8.4 g/dL — ABNORMAL LOW (ref 12.0–15.0)
MCH: 29 pg (ref 26.0–34.0)
MCHC: 32.8 g/dL (ref 30.0–36.0)
MCV: 88.3 fL (ref 80.0–100.0)
Platelets: 171 10*3/uL (ref 150–400)
RBC: 2.9 MIL/uL — ABNORMAL LOW (ref 3.87–5.11)
RDW: 14.4 % (ref 11.5–15.5)
WBC: 13.5 10*3/uL — ABNORMAL HIGH (ref 4.0–10.5)
nRBC: 0 % (ref 0.0–0.2)

## 2022-02-18 LAB — GLUCOSE, CAPILLARY
Glucose-Capillary: 29 mg/dL — CL (ref 70–99)
Glucose-Capillary: 45 mg/dL — ABNORMAL LOW (ref 70–99)
Glucose-Capillary: 48 mg/dL — ABNORMAL LOW (ref 70–99)
Glucose-Capillary: 69 mg/dL — ABNORMAL LOW (ref 70–99)
Glucose-Capillary: 166 mg/dL — ABNORMAL HIGH (ref 70–99)
Glucose-Capillary: 155 mg/dL — ABNORMAL HIGH (ref 70–99)
Glucose-Capillary: 108 mg/dL — ABNORMAL HIGH (ref 70–99)
Glucose-Capillary: 73 mg/dL (ref 70–99)

## 2022-02-18 LAB — BPAM RBC
Blood Product Expiration Date: 202310112359
Blood Product Expiration Date: 202310122359
Blood Product Expiration Date: 202310122359
Blood Product Expiration Date: 202311022359
ISSUE DATE / TIME: 202310050849
ISSUE DATE / TIME: 202310050849
Unit Type and Rh: 5100
Unit Type and Rh: 9500
Unit Type and Rh: 9500
Unit Type and Rh: 9500

## 2022-02-18 LAB — TYPE AND SCREEN
ABO/RH(D): O NEG
Antibody Screen: NEGATIVE
Unit division: 0
Unit division: 0
Unit division: 0
Unit division: 0

## 2022-02-18 LAB — BASIC METABOLIC PANEL
Anion gap: 9 (ref 5–15)
BUN: 21 mg/dL (ref 8–23)
CO2: 26 mmol/L (ref 22–32)
Calcium: 8.4 mg/dL — ABNORMAL LOW (ref 8.9–10.3)
Chloride: 103 mmol/L (ref 98–111)
Creatinine, Ser: 1.18 mg/dL — ABNORMAL HIGH (ref 0.44–1.00)
GFR, Estimated: 52 mL/min — ABNORMAL LOW (ref >60)
Glucose, Bld: 182 mg/dL — ABNORMAL HIGH (ref 70–99)
Potassium: 4.2 mmol/L (ref 3.5–5.1)
Sodium: 138 mmol/L (ref 135–145)

## 2022-02-18 MED ORDER — SENNOSIDES-DOCUSATE SODIUM 8.6-50 MG PO TABS: 1.0000 | ORAL_TABLET | Freq: Two times a day (BID) | ORAL | Status: DC | PRN

## 2022-02-18 MED ORDER — ASPIRIN 325 MG PO TBEC
325.0000 mg | DELAYED_RELEASE_TABLET | Freq: Every day | ORAL | Status: DC
  Administered 2022-02-19 – 2022-02-20 (×2): 325 mg via ORAL
  Filled 2022-02-18 (×2): qty 1

## 2022-02-18 MED ORDER — SODIUM CHLORIDE 0.9 % IV SOLN: 250.0000 mL | INTRAVENOUS | Status: DC | PRN

## 2022-02-18 MED ORDER — SODIUM CHLORIDE 0.9% FLUSH: 3.0000 mL | INTRAVENOUS | Status: DC | PRN

## 2022-02-18 MED ORDER — ~~LOC~~ CARDIAC SURGERY, PATIENT & FAMILY EDUCATION: Freq: Once | Status: AC

## 2022-02-18 MED ORDER — SODIUM CHLORIDE 0.9% FLUSH
3.0000 mL | Freq: Two times a day (BID) | INTRAVENOUS | Status: DC
  Administered 2022-02-18 – 2022-02-19 (×4): 3 mL via INTRAVENOUS

## 2022-02-18 MED ORDER — METFORMIN HCL ER 750 MG PO TB24
750.0000 mg | ORAL_TABLET | Freq: Every day | ORAL | Status: DC
  Administered 2022-02-20: 750 mg via ORAL
  Filled 2022-02-18 (×3): qty 1

## 2022-02-18 NOTE — Plan of Care (Signed)
Problem: Education: Goal: Understanding of CV disease, CV risk reduction, and recovery process will improve Outcome: Progressing Goal: Individualized Educational Video(s) Outcome: Progressing   Problem: Activity: Goal: Ability to return to baseline activity level will improve Outcome: Progressing   Problem: Cardiovascular: Goal: Ability to achieve and maintain adequate cardiovascular perfusion will improve Outcome: Progressing Goal: Vascular access site(s) Level 0-1 will be maintained Outcome: Progressing   Problem: Health Behavior/Discharge Planning: Goal: Ability to safely manage health-related needs after discharge will improve Outcome: Progressing   Problem: Education: Goal: Knowledge of General Education information will improve Description: Including pain rating scale, medication(s)/side effects and non-pharmacologic comfort measures Outcome: Progressing   Problem: Health Behavior/Discharge Planning: Goal: Ability to manage health-related needs will improve Outcome: Progressing   Problem: Clinical Measurements: Goal: Ability to maintain clinical measurements within normal limits will improve Outcome: Progressing Goal: Will remain free from infection Outcome: Progressing Goal: Diagnostic test results will improve Outcome: Progressing Goal: Respiratory complications will improve Outcome: Progressing Goal: Cardiovascular complication will be avoided Outcome: Progressing   Problem: Activity: Goal: Risk for activity intolerance will decrease Outcome: Progressing   Problem: Nutrition: Goal: Adequate nutrition will be maintained Outcome: Progressing   Problem: Coping: Goal: Level of anxiety will decrease Outcome: Progressing   Problem: Elimination: Goal: Will not experience complications related to bowel motility Outcome: Progressing Goal: Will not experience complications related to urinary retention Outcome: Progressing   Problem: Pain Managment: Goal:  General experience of comfort will improve Outcome: Progressing   Problem: Safety: Goal: Ability to remain free from injury will improve Outcome: Progressing   Problem: Skin Integrity: Goal: Risk for impaired skin integrity will decrease Outcome: Progressing   Problem: Education: Goal: Will demonstrate proper wound care and an understanding of methods to prevent future damage Outcome: Progressing Goal: Knowledge of disease or condition will improve Outcome: Progressing Goal: Knowledge of the prescribed therapeutic regimen will improve Outcome: Progressing Goal: Individualized Educational Video(s) Outcome: Progressing   Problem: Activity: Goal: Risk for activity intolerance will decrease Outcome: Progressing   Problem: Cardiac: Goal: Will achieve and/or maintain hemodynamic stability Outcome: Progressing   Problem: Clinical Measurements: Goal: Postoperative complications will be avoided or minimized Outcome: Progressing   Problem: Respiratory: Goal: Respiratory status will improve Outcome: Progressing   Problem: Skin Integrity: Goal: Wound healing without signs and symptoms of infection Outcome: Progressing Goal: Risk for impaired skin integrity will decrease Outcome: Progressing   Problem: Education: Goal: Knowledge of General Education information will improve Description: Including pain rating scale, medication(s)/side effects and non-pharmacologic comfort measures Outcome: Progressing   Problem: Health Behavior/Discharge Planning: Goal: Ability to manage health-related needs will improve Outcome: Progressing   Problem: Clinical Measurements: Goal: Ability to maintain clinical measurements within normal limits will improve Outcome: Progressing Goal: Will remain free from infection Outcome: Progressing Goal: Diagnostic test results will improve Outcome: Progressing Goal: Respiratory complications will improve Outcome: Progressing Goal: Cardiovascular  complication will be avoided Outcome: Progressing   Problem: Activity: Goal: Risk for activity intolerance will decrease Outcome: Progressing   Problem: Nutrition: Goal: Adequate nutrition will be maintained Outcome: Progressing   Problem: Coping: Goal: Level of anxiety will decrease Outcome: Progressing   Problem: Elimination: Goal: Will not experience complications related to bowel motility Outcome: Progressing Goal: Will not experience complications related to urinary retention Outcome: Progressing   Problem: Pain Managment: Goal: General experience of comfort will improve Outcome: Progressing   Problem: Safety: Goal: Ability to remain free from injury will improve Outcome: Progressing   Problem: Skin Integrity: Goal: Risk  for impaired skin integrity will decrease Outcome: Progressing

## 2022-02-18 NOTE — Progress Notes (Signed)
4 Days Post-Op Procedure(s) (LRB): AORTIC VALVE REPLACEMENT (AVR) USING EDWARDS INSPIRIS 23 MM RESILIA AORTIC VALVE (N/A) REPLACEMENT ASCENDING AORTIC ANEURYSM HEMASHIELD PLATINUM 30 MM GRAFT (N/A) possible BENTALL PROCEDURE (N/A) TRANSESOPHAGEAL ECHOCARDIOGRAM (TEE) (N/A) Subjective:  Hypoglycemia overnight with diaphoresis and shakiness. Received Levemir yesterday. Otherwise feels well. Good appetite. Feels like her bowels are getting ready to move.  Objective: Vital signs in last 24 hours: Temp:  [97.6 F (36.4 C)-98.1 F (36.7 C)] 98.1 F (36.7 C) (10/09 0400) Pulse Rate:  [59-124] 65 (10/09 0500) Cardiac Rhythm: Normal sinus rhythm (10/09 0400) Resp:  [10-24] 16 (10/09 0700) BP: (68-144)/(48-81) 130/81 (10/09 0700) SpO2:  [87 %-100 %] 97 % (10/09 0500) Weight:  [78.1 kg] 78.1 kg (10/09 0500)  Hemodynamic parameters for last 24 hours:    Intake/Output from previous day: 10/08 0701 - 10/09 0700 In: 612.7 [P.O.:480; I.V.:132.7] Out: 2910 [Urine:2750; Chest Tube:160] Intake/Output this shift: No intake/output data recorded.  General appearance: alert and cooperative Neurologic: intact Heart: regular rate and rhythm, S1, S2 normal, no murmur Lungs: clear to auscultation bilaterally Extremities: extremities normal, no edema Wound: incision ok  Lab Results: Recent Labs    02/17/22 0438 02/18/22 0700  WBC 13.0* 13.5*  HGB 8.4* 8.4*  HCT 25.1* 25.6*  PLT 133* 171   BMET:  Recent Labs    02/16/22 0427 02/17/22 0438  NA 138 138  K 3.8 3.7  CL 104 101  CO2 23 27  GLUCOSE 112* 62*  BUN 18 17  CREATININE 1.11* 0.85  CALCIUM 8.3* 8.7*    PT/INR: No results for input(s): "LABPROT", "INR" in the last 72 hours. ABG    Component Value Date/Time   PHART 7.386 02/14/2022 2315   HCO3 20.2 02/14/2022 2315   TCO2 21 (L) 02/14/2022 2315   ACIDBASEDEF 4.0 (H) 02/14/2022 2315   O2SAT 61.4 02/15/2022 0438   CBG (last 3)  Recent Labs    02/18/22 0519 02/18/22 0521  02/18/22 0619  GLUCAP 48* 69* 166*    Assessment/Plan: S/P Procedure(s) (LRB): AORTIC VALVE REPLACEMENT (AVR) USING EDWARDS INSPIRIS 23 MM RESILIA AORTIC VALVE (N/A) REPLACEMENT ASCENDING AORTIC ANEURYSM HEMASHIELD PLATINUM 30 MM GRAFT (N/A) possible BENTALL PROCEDURE (N/A) TRANSESOPHAGEAL ECHOCARDIOGRAM (TEE) (N/A)  POD 4 Hemodynamically stable. Continue Coreg, Norvasc, Avapro. Rhythm is sinus.  -2300 cc yesterday but recorded  wt is up a few lbs. She has no edema so will see how weight is tomorrow.  Hypoglycemia: stop Levemir and resume Metformin and continue SSI with meals.  Check BMET and CBC this am and remove sleeve.  Transfer to 4E. Continue ambulation and IS.   LOS: 4 days    Gaye Pollack 02/18/2022

## 2022-02-18 NOTE — Progress Notes (Signed)
CARDIAC REHAB PHASE I   PRE:  Rate/Rhythm: 88 SR    BP: sitting 123/84    SaO2: 95 RA  MODE:  Ambulation: 470 ft   POST:  Rate/Rhythm: 101 ST    BP: sitting 133/75     SaO2: 96 RA  Pt moved out of bed and ambulated to BR with standby assist. Able to clean herself. Ambulated hall with RW, steady gait, no c/o. To recliner, VSS. Practiced IS, 1250 ml. Encouraged x2 more walks with staff or family. Pt is interested in [facility] and will refer to [place] BS, ACSM-CEP 02/18/2022 2:52 PM

## 2022-02-18 NOTE — Progress Notes (Signed)
Pt arrived to 4E from [place]. A&Ox4. VSS. Midline incision OTA. Pt oriented to room, call light in reach. Raelyn Number, RN    02/18/22 1120  Vitals  Temp 99 F (37.2 C)  Temp Source Oral  BP 120/71  MAP (mmHg) 87  BP Location Right Arm  BP Method Automatic  Patient Position (if appropriate) Lying  Pulse Rate Source Monitor  ECG Heart Rate 100  Resp 18  Level of Consciousness  Level of Consciousness Alert  MEWS COLOR  MEWS Score Color Green  Oxygen Therapy  SpO2 100 %  O2 Device Room Air  Pain Assessment  Pain Scale 0-10  Pain Score 4  Pain Type Surgical pain  Pain Location Chest  Pain Orientation Mid  MEWS Score  MEWS Temp 0  MEWS Systolic 0  MEWS Pulse 0  MEWS RR 0  MEWS LOC 0  MEWS Score 0

## 2022-02-18 NOTE — Progress Notes (Signed)
Pt BS dropped to 45. Orange juice 240 cc given. Repeat CBG 48 from fingerstick so rechecked CBG 69  blood Sample taken from Cental line. Again 240 cc or orange juice given.

## 2022-02-19 ENCOUNTER — Inpatient Hospital Stay (HOSPITAL_COMMUNITY): Payer: 59

## 2022-02-19 LAB — GLUCOSE, CAPILLARY
Glucose-Capillary: 94 mg/dL (ref 70–99)
Glucose-Capillary: 166 mg/dL — ABNORMAL HIGH (ref 70–99)
Glucose-Capillary: 156 mg/dL — ABNORMAL HIGH (ref 70–99)
Glucose-Capillary: 180 mg/dL — ABNORMAL HIGH (ref 70–99)

## 2022-02-19 MED ORDER — ACETAMINOPHEN 325 MG PO TABS
650.0000 mg | ORAL_TABLET | Freq: Four times a day (QID) | ORAL | Status: DC | PRN
  Administered 2022-02-19 – 2022-02-20 (×2): 650 mg via ORAL
  Filled 2022-02-19 (×2): qty 2

## 2022-02-19 MED ORDER — IRBESARTAN 300 MG PO TABS
300.0000 mg | ORAL_TABLET | Freq: Every day | ORAL | Status: DC
  Administered 2022-02-19 – 2022-02-20 (×2): 300 mg via ORAL
  Filled 2022-02-19 (×2): qty 1

## 2022-02-19 MED ORDER — POTASSIUM CHLORIDE CRYS ER 20 MEQ PO TBCR
20.0000 meq | EXTENDED_RELEASE_TABLET | Freq: Every day | ORAL | Status: DC
  Administered 2022-02-19 – 2022-02-20 (×2): 20 meq via ORAL
  Filled 2022-02-19 (×2): qty 1

## 2022-02-19 MED ORDER — FUROSEMIDE 40 MG PO TABS
40.0000 mg | ORAL_TABLET | Freq: Every day | ORAL | Status: DC
  Administered 2022-02-19 – 2022-02-20 (×2): 40 mg via ORAL
  Filled 2022-02-19 (×2): qty 1

## 2022-02-19 NOTE — Plan of Care (Signed)
  Problem: Education: Goal: Understanding of CV disease, CV risk reduction, and recovery process will improve Outcome: Progressing   Problem: Activity: Goal: Ability to return to baseline activity level will improve Outcome: Progressing   Problem: Cardiovascular: Goal: Ability to achieve and maintain adequate cardiovascular perfusion will improve Outcome: Progressing   Problem: Cardiovascular: Goal: Vascular access site(s) Level 0-1 will be maintained Outcome: Progressing   Problem: Cardiovascular: Goal: Vascular access site(s) Level 0-1 will be maintained Outcome: Progressing   Problem: Health Behavior/Discharge Planning: Goal: Ability to safely manage health-related needs after discharge will improve Outcome: Progressing

## 2022-02-19 NOTE — Progress Notes (Signed)
Mobility Specialist Progress Note   02/19/22 1541  Mobility  Activity Ambulated independently in hallway  Level of Assistance Independent  Assistive Device None  Distance Ambulated (ft) 500 ft (+)  Range of Motion/Exercises Active;All extremities  Activity Response Tolerated well   Pre Mobility:  HR 98 (active) During Mobility: HR 100 Post Mobility: HR 93; BP 118/71  Patient received standing at bedside, eager to participate in mobility. Ambulated independently with steady gait. During ambulation pt had questions regarding elevated HR with minimal exertion, explained to refer to cardiovascular team over concerns. Returned to room without complaint or incident. Was left dangling EOB with all needs met, call bell in reach.   [place] Reiana Poteet, [place], [place]  JPETK:[phone] Office: [phone]

## 2022-02-19 NOTE — Progress Notes (Signed)
CARDIAC REHAB PHASE I   PRE:  Rate/Rhythm: 93 NSR  BP:  Sitting: 103/70      SaO2: 96 RA  MODE:  Ambulation: 470 ft   POST:  Rate/Rhythm: 101 ST  BP:  Sitting: 98/73      SaO2: 96 RA  Pt received from bed following sternal precautions independently and was ambulated through the hallways with standby assistance. Pt had no sob or cp during ambulation and was returned to bed w/o c/p. Pt was educated on sternal precautions, restrictions, ex guidelines, diabetic diet, and CRPII. Pt is being referred to [facility].   [phone]  Christen Bame  11:48 AM 02/19/2022

## 2022-02-19 NOTE — Progress Notes (Signed)
Epicardial wires removed. Pt tolerated well. All ends intact. Pt placed on bedrest for one hour.   Raelyn Number, RN

## 2022-02-19 NOTE — Progress Notes (Addendum)
[address]       ,[place] [address]             [phone]       5 Days Post-Op Procedure(s) (LRB): AORTIC VALVE REPLACEMENT (AVR) USING EDWARDS INSPIRIS 23 MM RESILIA AORTIC VALVE (N/A) REPLACEMENT ASCENDING AORTIC ANEURYSM HEMASHIELD PLATINUM 30 MM GRAFT (N/A) possible BENTALL PROCEDURE (N/A) TRANSESOPHAGEAL ECHOCARDIOGRAM (TEE) (N/A) Subjective: Patient feels much better today, complains of slightly increased pain last night. Walked 3 times yesterday.  Objective: Vital signs in last 24 hours: Temp:  [97.6 F (36.4 C)-100 F (37.8 C)] 100 F (37.8 C) (10/10 0430) Pulse Rate:  [81-97] 96 (10/10 0430) Cardiac Rhythm: Normal sinus rhythm (10/10 0450) Resp:  [14-20] 18 (10/10 0430) BP: (111-157)/(60-94) 157/94 (10/10 0430) SpO2:  [93 %-100 %] 99 % (10/10 0430) Weight:  [77 kg] 77 kg (10/10 0435)  Hemodynamic parameters for last 24 hours:    Intake/Output from previous day: 10/09 0701 - 10/10 0700 In: 320 [P.O.:320] Out: 775 [Urine:775] Intake/Output this shift: No intake/output data recorded.  General appearance: alert, cooperative, and no distress Neurologic: intact Heart: regular rate and rhythm, S1, S2 normal, no murmur, click, rub or gallop Lungs: clear to auscultation bilaterally Abdomen: soft, non-tender; bowel sounds normal; no masses,  no organomegaly Extremities: extremities normal, atraumatic, no cyanosis or edema Wound: Clean, dry, intact  Lab Results: Recent Labs    02/17/22 0438 02/18/22 0700  WBC 13.0* 13.5*  HGB 8.4* 8.4*  HCT 25.1* 25.6*  PLT 133* 171   BMET:  Recent Labs    02/17/22 0438 02/18/22 0700  NA 138 138  K 3.7 4.2  CL 101 103  CO2 27 26  GLUCOSE 62* 182*  BUN 17 21  CREATININE 0.85 1.18*  CALCIUM 8.7* 8.4*    PT/INR: No results for input(s): "LABPROT", "INR" in the last 72 hours. ABG    Component Value Date/Time   PHART 7.386 02/14/2022 2315   HCO3 20.2 02/14/2022 2315   TCO2 21 (L)  02/14/2022 2315   ACIDBASEDEF 4.0 (H) 02/14/2022 2315   O2SAT 61.4 02/15/2022 0438   CBG (last 3)  Recent Labs    02/18/22 1607 02/18/22 2202 02/19/22 0629  GLUCAP 108* 73 94    Assessment/Plan: S/P Procedure(s) (LRB): AORTIC VALVE REPLACEMENT (AVR) USING EDWARDS INSPIRIS 23 MM RESILIA AORTIC VALVE (N/A) REPLACEMENT ASCENDING AORTIC ANEURYSM HEMASHIELD PLATINUM 30 MM GRAFT (N/A) possible BENTALL PROCEDURE (N/A) TRANSESOPHAGEAL ECHOCARDIOGRAM (TEE) (N/A)  Neuro: Some increased pain, patient states she prefers tylenol because 2 tabs of oxycodone makes her loopy. Will reorder tylenol. CV: NSR, rate 98. BP 157/94 this AM. On 25mg  Coreg, 5 mg Norvasc, 150mg  Avapro. On telmisartan 80mg  at home. Will increase Avapro to 300mg . D/C EPW today.  Pulm: Saturating 99% on RA. Ambulated 3 times yesterday. Continue IS and ambulation. GI: Good appetite, +BM Endo: DM2, no further hypoglycemia, CBGs 108, 73, 94. Will restart home meds at discharge. Will order glucometer for home.  Renal: Cr 1.18 today, +3kg. Will discuss holding diuresis with Dr. Cyndia Bent. ID: Tmax 100, leukocytosis 13.5. No sign of infection. Will monitor. Expected postop ABLA: stable H/H  Dispo: D/C EPW. Home tomorrow AM   LOS: 5 days    Courtney River, PA-C 02/19/2022   Chart reviewed, patient examined, agree with above. Feels well. Remains in sinus rhythm.  Wt is 7 lbs over preop. Continue lasix for a couple days. Will see what weight is tomorrow. She has no  edema on exam. Plan home tomorrow on previous antihypertensive regimen.

## 2022-02-20 ENCOUNTER — Other Ambulatory Visit (HOSPITAL_BASED_OUTPATIENT_CLINIC_OR_DEPARTMENT_OTHER): Payer: Self-pay

## 2022-02-20 ENCOUNTER — Other Ambulatory Visit: Payer: Self-pay

## 2022-02-20 LAB — CBC
HCT: 24.6 % — ABNORMAL LOW (ref 36.0–46.0)
Hemoglobin: 8 g/dL — ABNORMAL LOW (ref 12.0–15.0)
MCH: 29.2 pg (ref 26.0–34.0)
MCHC: 32.5 g/dL (ref 30.0–36.0)
MCV: 89.8 fL (ref 80.0–100.0)
Platelets: 249 10*3/uL (ref 150–400)
RBC: 2.74 MIL/uL — ABNORMAL LOW (ref 3.87–5.11)
RDW: 14.8 % (ref 11.5–15.5)
WBC: 11.1 10*3/uL — ABNORMAL HIGH (ref 4.0–10.5)
nRBC: 0 % (ref 0.0–0.2)

## 2022-02-20 LAB — BASIC METABOLIC PANEL
Anion gap: 14 (ref 5–15)
BUN: 20 mg/dL (ref 8–23)
CO2: 21 mmol/L — ABNORMAL LOW (ref 22–32)
Calcium: 8.7 mg/dL — ABNORMAL LOW (ref 8.9–10.3)
Chloride: 103 mmol/L (ref 98–111)
Creatinine, Ser: 1.15 mg/dL — ABNORMAL HIGH (ref 0.44–1.00)
GFR, Estimated: 54 mL/min — ABNORMAL LOW (ref >60)
Glucose, Bld: 121 mg/dL — ABNORMAL HIGH (ref 70–99)
Potassium: 3.7 mmol/L (ref 3.5–5.1)
Sodium: 138 mmol/L (ref 135–145)

## 2022-02-20 LAB — GLUCOSE, CAPILLARY: Glucose-Capillary: 122 mg/dL — ABNORMAL HIGH (ref 70–99)

## 2022-02-20 MED ORDER — AMLODIPINE BESYLATE 5 MG PO TABS
5.0000 mg | ORAL_TABLET | Freq: Every day | ORAL | 1 refills | Status: DC
  Filled 2022-02-20 (×2): qty 30, 30d supply, fill #0
  Filled 2022-04-16: qty 30, 30d supply, fill #1

## 2022-02-20 MED ORDER — BLOOD GLUCOSE MONITOR KIT
PACK | 0 refills | Status: AC
  Filled 2022-02-20: qty 1, fill #0
  Filled 2022-02-20: qty 1, 1d supply, fill #0

## 2022-02-20 MED ORDER — GLUCOSE BLOOD VI STRP
ORAL_STRIP | 0 refills | Status: DC
  Filled 2022-02-20: qty 100, 25d supply, fill #0

## 2022-02-20 MED ORDER — FREESTYLE LANCETS MISC
0 refills | Status: DC
  Filled 2022-02-20: qty 100, 25d supply, fill #0

## 2022-02-20 MED ORDER — ACETAMINOPHEN 325 MG PO TABS: 650.0000 mg | ORAL_TABLET | Freq: Four times a day (QID) | ORAL | Status: AC | PRN

## 2022-02-20 MED ORDER — ASPIRIN 325 MG PO TBEC: 325.0000 mg | DELAYED_RELEASE_TABLET | Freq: Every day | ORAL | Status: DC

## 2022-02-20 MED FILL — Mannitol IV Soln 20%: INTRAVENOUS | Qty: 500 | Status: AC

## 2022-02-20 MED FILL — Electrolyte-R (PH 7.4) Solution: INTRAVENOUS | Qty: 4000 | Status: AC

## 2022-02-20 MED FILL — Calcium Chloride Inj 10%: INTRAVENOUS | Qty: 10 | Status: AC

## 2022-02-20 MED FILL — Sodium Bicarbonate IV Soln 8.4%: INTRAVENOUS | Qty: 50 | Status: AC

## 2022-02-20 MED FILL — Sodium Chloride IV Soln 0.9%: INTRAVENOUS | Qty: 3000 | Status: AC

## 2022-02-20 MED FILL — Heparin Sodium (Porcine) Inj 1000 Unit/ML: INTRAMUSCULAR | Qty: 10 | Status: AC

## 2022-02-20 NOTE — Progress Notes (Addendum)
      [address]       [place] [address]             [phone]       6 Days Post-Op Procedure(s) (LRB): AORTIC VALVE REPLACEMENT (AVR) USING EDWARDS INSPIRIS 23 MM RESILIA AORTIC VALVE (N/A) REPLACEMENT ASCENDING AORTIC ANEURYSM HEMASHIELD PLATINUM 30 MM GRAFT (N/A) possible BENTALL PROCEDURE (N/A) TRANSESOPHAGEAL ECHOCARDIOGRAM (TEE) (N/A) Subjective: Patient feels she is ready to go home, no new complaints  Objective: Vital signs in last 24 hours: Temp:  [98.5 F (36.9 C)-99.5 F (37.5 C)] 98.7 F (37.1 C) (10/11 0728) Pulse Rate:  [88-99] 96 (10/11 0728) Cardiac Rhythm: Normal sinus rhythm (10/10 1900) Resp:  [16-20] 18 (10/11 0728) BP: (99-143)/(60-84) 143/81 (10/11 0728) SpO2:  [92 %-100 %] 97 % (10/11 0728) Weight:  [76.8 kg] 76.8 kg (10/11 0500)  Hemodynamic parameters for last 24 hours:    Intake/Output from previous day: No intake/output data recorded. Intake/Output this shift: No intake/output data recorded.  General appearance: alert, cooperative, and no distress Neurologic: intact Heart: NSR-ST, no murmur, rub or gallop Lungs: clear to auscultation bilaterally Abdomen: soft, non-tender; bowel sounds normal; no masses,  no organomegaly Extremities: extremities normal, atraumatic, no cyanosis or edema Wound: Clean, dry, intact  Lab Results: Recent Labs    02/18/22 0700 02/20/22 0641  WBC 13.5* 11.1*  HGB 8.4* 8.0*  HCT 25.6* 24.6*  PLT 171 249   BMET:  Recent Labs    02/18/22 0700  NA 138  K 4.2  CL 103  CO2 26  GLUCOSE 182*  BUN 21  CREATININE 1.18*  CALCIUM 8.4*    PT/INR: No results for input(s): "LABPROT", "INR" in the last 72 hours. ABG    Component Value Date/Time   PHART 7.386 02/14/2022 2315   HCO3 20.2 02/14/2022 2315   TCO2 21 (L) 02/14/2022 2315   ACIDBASEDEF 4.0 (H) 02/14/2022 2315   O2SAT 61.4 02/15/2022 0438   CBG (last 3)  Recent Labs    02/19/22 1548 02/19/22 2131 02/20/22 0639  GLUCAP  156* 180* 122*    Assessment/Plan: S/P Procedure(s) (LRB): AORTIC VALVE REPLACEMENT (AVR) USING EDWARDS INSPIRIS 23 MM RESILIA AORTIC VALVE (N/A) REPLACEMENT ASCENDING AORTIC ANEURYSM HEMASHIELD PLATINUM 30 MM GRAFT (N/A) possible BENTALL PROCEDURE (N/A) TRANSESOPHAGEAL ECHOCARDIOGRAM (TEE) (N/A)  CV: NSR-ST HR 100 this AM. SBP 139. Will restart home HTN medications at discharge.  Pulm: Saturating 92-100% on RA. Ambulating well. Continue IS and ambulation. GI: +BM Endo: T2DM. No further hypoglycemia. Will restart home diabetes medications at discharge. Renal: BMET ordered, no results at the moment. Volume overload, cr 1.18 yesterday, +3kg from preop weight. No edema on exam, will d/c lasix at discharge. ID: Tmax 99.5, leukocytosis trending down 11.1 today. No sign of infection. Dispo: Plan to d/c home today   LOS: 6 days    Courtney River, PA-C 02/20/2022   Chart reviewed, patient examined, agree with above. Her preop dizziness has resolved. Feels well. Plan home today.

## 2022-02-20 NOTE — Progress Notes (Signed)
CARDIAC REHAB PHASE I   Came to check on pt. Reiterated exercise guidelines and CRPHII at [facility]. Pt verbalized understanding of exercise guidelines and CRPHII. She stated that she has been walking and plans to keep it up at home.  [place], MS, ACSM-CEP 02/20/2022 9:27 AM

## 2022-02-21 ENCOUNTER — Telehealth: Payer: Self-pay

## 2022-02-21 ENCOUNTER — Other Ambulatory Visit: Payer: Self-pay

## 2022-02-21 NOTE — Telephone Encounter (Signed)
Transition Care Management Unsuccessful Follow-up Telephone Call  Date of discharge and from where:  02/20/22 [facility]   Attempts:  1st Attempt  Hospital follow up appointment with PCP offered post discharge, declined. Patient is scheduled with Cardiology and agrees to keep previously scheduled appointment on 03/25/22 with PCP.  Patient notes her pain is now comfortably managed with tylenol and Tramadol. No longer taking oxycodone. Agrees to call the office if symptoms present, worsen and as needed.   Leyan Branden O'Brien-Blaney LPN, [place], [place] Direct dial [phone] ??

## 2022-02-25 ENCOUNTER — Ambulatory Visit
Admission: RE | Admit: 2022-02-25 | Discharge: 2022-02-25 | Disposition: A | Discharge status: Home / Self Care | Payer: 59 | Source: Ambulatory Visit | Attending: Surgery | Admitting: Surgery

## 2022-02-25 ENCOUNTER — Ambulatory Visit (INDEPENDENT_AMBULATORY_CARE_PROVIDER_SITE_OTHER): Payer: Self-pay | Admitting: Surgery

## 2022-02-25 ENCOUNTER — Other Ambulatory Visit: Payer: Self-pay

## 2022-02-25 ENCOUNTER — Other Ambulatory Visit: Payer: Self-pay | Admitting: *Deleted

## 2022-02-25 ENCOUNTER — Encounter: Payer: Self-pay | Admitting: Surgery

## 2022-02-25 VITALS — BP 150/88 | HR 89 | Temp 97.9°F

## 2022-02-25 DIAGNOSIS — Z4802 Encounter for removal of sutures: Secondary | ICD-10-CM

## 2022-02-25 DIAGNOSIS — I517 Cardiomegaly: Secondary | ICD-10-CM | POA: Diagnosis not present

## 2022-02-25 DIAGNOSIS — Z952 Presence of prosthetic heart valve: Secondary | ICD-10-CM

## 2022-02-25 MED ORDER — FUROSEMIDE 40 MG PO TABS
40.0000 mg | ORAL_TABLET | Freq: Every day | ORAL | 0 refills | Status: DC
  Filled 2022-02-25: qty 30, 30d supply, fill #0

## 2022-02-25 MED ORDER — POTASSIUM CHLORIDE CRYS ER 20 MEQ PO TBCR
20.0000 meq | EXTENDED_RELEASE_TABLET | Freq: Every day | ORAL | 0 refills | Status: DC
  Filled 2022-02-25: qty 30, 30d supply, fill #0

## 2022-02-25 NOTE — Progress Notes (Signed)
HPI: Patient returns for routine postoperative follow-up having undergone supra-coronary replacement of the ascending aorta under deep hypothermic circulatory arrest and AVR with a 23 mm INSPIRIS pericardial valve on 02/14/2022. The patient's early postoperative recovery while in the hospital was notable for an uncomplicated postoperative course. Since hospital discharge the patient reports that she has developed some shortness of breath.  She has noticed some pleuritic right-sided chest pain.  She said that she is able to walk well in the hospital and felt well at the time of discharge.  Her son has taken her to the airport and she walked around the airport terminal but had to stop several times.  She has noticed some congestion and wheezing yesterday.  She has noticed her weight is up a little bit from her baseline since she went home.  She has noticed minimal edema in her ankles.   Current Outpatient Medications  Medication Sig Dispense Refill   furosemide (LASIX) 40 MG tablet Take 1 tablet (40 mg total) by mouth daily. 30 tablet 0   potassium chloride SA (KLOR-CON M) 20 MEQ tablet Take 1 tablet (20 mEq total) by mouth daily. 30 tablet 0   acetaminophen (TYLENOL) 325 MG tablet Take 2 tablets (650 mg total) by mouth every 6 (six) hours as needed for mild pain.     ALPRAZolam (XANAX) 0.25 MG tablet Take 1 tablet (0.25 mg total) by mouth 2 (two) times daily as needed. 60 tablet 5   amLODipine (NORVASC) 5 MG tablet Take 1 tablet (5 mg total) by mouth daily. 30 tablet 1   aspirin EC 325 MG tablet Take 1 tablet (325 mg total) by mouth daily.     blood glucose meter kit and supplies KIT Dispense based on patient and insurance preference. Use up to four times daily as directed. 1 each 0   carvedilol (COREG) 25 MG tablet Take one tablet  by mouth 2 (two) times daily with a meal. 180 tablet 1   estradiol (ESTRACE) 0.1 MG/GM vaginal cream INSERT 1/4 APPLICATORFUL VAGINALLY TWO TIMES A WEEK AS DIRECTED  (Patient taking differently: Place 1 Applicatorful vaginally once a week.) 42.5 g 5   fluticasone (FLONASE) 50 MCG/ACT nasal spray PLACE 2 SPRAYS INTO BOTH NOSTRILS DAILY. 16 g 2   glucose blood test strip Use up to 4 times a day as directed 100 each 0   hydrALAZINE (APRESOLINE) 25 MG tablet Take 1 tablet (25 mg total) by mouth 3 (three) times daily. 270 tablet 3   Lancets (FREESTYLE) lancets Use up to 4 times a day as directed 100 each 0   Menthol, Topical Analgesic, (BIOFREEZE EX) Apply 1 application  topically 3 (three) times daily as needed (neck pain).     metFORMIN (GLUCOPHAGE-XR) 750 MG 24 hr tablet Take 1 tablet (750 mg total) by mouth daily with breakfast. 90 tablet 1   rosuvastatin (CRESTOR) 5 MG tablet Take 1 tablet (5 mg total) by mouth at bedtime. 90 tablet 2   [START ON 03/01/2022] Semaglutide,0.25 or 0.5MG/DOS, (OZEMPIC, 0.25 OR 0.5 MG/DOSE,) 2 MG/3ML SOPN Inject 0.25 mg into the skin once a week. 3 mL 2   sodium fluoride (PREVIDENT 5000 PLUS) 1.1 % CREA dental cream Brush teeth thoroughly for 2 minutes, do not rinse afterwards (Patient taking differently: Place 1 Application onto teeth at bedtime.) 51 g 12   sodium fluoride (SF 5000 PLUS) 1.1 % CREA dental cream Brush teeth thoroughly for 2 minutes, do not rinse afterwards. (Patient not taking: Reported on  02/07/2022) 51 g 12   telmisartan (MICARDIS) 80 MG tablet Take 1 tablet (80 mg total) by mouth daily. (Patient taking differently: Take 40-80 mg by mouth daily.) 90 tablet 1   traMADol (ULTRAM) 50 MG tablet TAKE 2 TABLETS (100 MG TOTAL) BY MOUTH EVERY 6 (SIX) HOURS AS NEEDED. 240 tablet 5   triamcinolone (NASACORT ALLERGY 24HR) 55 MCG/ACT AERO nasal inhaler Place 2 sprays into the nose daily as needed (allergies).     No current facility-administered medications for this visit.    Physical Exam: BP (!) 150/88 (BP Location: Right Arm, Patient Position: Sitting)   Pulse 89   Temp 97.9 F (36.6 C)   LMP  (LMP Unknown)   SpO2 93%  Comment: RA She looks well. Cardiac exam shows a regular rate and rhythm with normal heart sounds.  There is no murmur or rub. Lungs are clear. The chest incision is healing well and the sternum is stable. There is trace bilateral ankle edema.  Diagnostic Tests:  CLINICAL DATA:  Status post aortic aneurysm repair   EXAM: CHEST - 2 VIEW   COMPARISON:  02/19/2022   FINDINGS: Previous median sternotomy and aortic valve replacement. Cardiomegaly as seen previously. Bilateral pleural effusions, right larger than left, with basilar atelectasis, worsened since the previous study.   IMPRESSION: Worsening of bilateral pleural effusions, right larger than left, with basilar atelectasis. Cardiomegaly. Previous aortic valve replacement.     Electronically Signed   By: Nelson Chimes M.D.   On: 02/25/2022 11:05  Impression:  Overall I think she is doing fairly well for only being about 10 days out from surgery.  Her chest x-ray does show increased pleural effusion on both sides but I think the effusions are small.  There is probably some associated atelectasis.  She did not go home on Lasix because she was only about 3 pounds over her baseline weight.  I think it would be best to put her on Lasix 40 mg daily and potassium chloride 20 meq daily to allow removal of the remaining excess volume and pleural effusions.  I encouraged her to continue using her incentive spirometer.  Her cardiac silhouette on chest x-ray looks stable and given her stable vital signs I doubt that there is a significant pericardial effusion.  Plan:  I sent in prescriptions to her pharmacy at [facility] for Lasix 40 mg daily and potassium chloride 20 mEq daily.  She will continue to follow her vital signs and weight at home.  I will plan to see her back in 1 week with a chest x-ray.   Gaye Pollack, MD [facility] [phone]

## 2022-03-01 ENCOUNTER — Other Ambulatory Visit: Payer: Self-pay

## 2022-03-04 ENCOUNTER — Encounter: Payer: Self-pay | Admitting: Surgery

## 2022-03-05 ENCOUNTER — Other Ambulatory Visit: Payer: Self-pay | Admitting: Surgery

## 2022-03-05 DIAGNOSIS — Z952 Presence of prosthetic heart valve: Secondary | ICD-10-CM

## 2022-03-06 ENCOUNTER — Other Ambulatory Visit: Payer: Self-pay | Admitting: Surgery

## 2022-03-06 ENCOUNTER — Ambulatory Visit (INDEPENDENT_AMBULATORY_CARE_PROVIDER_SITE_OTHER): Payer: Self-pay | Admitting: Surgery

## 2022-03-06 ENCOUNTER — Ambulatory Visit
Admission: RE | Admit: 2022-03-06 | Discharge: 2022-03-06 | Disposition: A | Discharge status: Home / Self Care | Payer: 59 | Source: Ambulatory Visit | Attending: Surgery | Admitting: Surgery

## 2022-03-06 ENCOUNTER — Encounter: Payer: Self-pay | Admitting: Surgery

## 2022-03-06 VITALS — BP 110/68 | HR 105 | Temp 98.3°F | Resp 20 | Wt 161.0 lb

## 2022-03-06 DIAGNOSIS — I7121 Aneurysm of the ascending aorta, without rupture: Secondary | ICD-10-CM

## 2022-03-06 DIAGNOSIS — I350 Nonrheumatic aortic (valve) stenosis: Secondary | ICD-10-CM

## 2022-03-06 DIAGNOSIS — Z952 Presence of prosthetic heart valve: Secondary | ICD-10-CM

## 2022-03-06 DIAGNOSIS — J90 Pleural effusion, not elsewhere classified: Secondary | ICD-10-CM

## 2022-03-06 DIAGNOSIS — R918 Other nonspecific abnormal finding of lung field: Secondary | ICD-10-CM | POA: Diagnosis not present

## 2022-03-06 NOTE — Progress Notes (Signed)
HPI:  Patient returns for routine postoperative follow-up having undergone supra-coronary replacement of the ascending aorta under deep hypothermic circulatory arrest and AVR with a 23 mm INSPIRIS pericardial valve on 02/14/2022. The patient's early postoperative recovery while in the hospital was notable for an uncomplicated postoperative course.  When I saw her in the office on 02/25/2022 chest x-ray showed increased bilateral pleural effusions with the right being larger than the left with bibasilar atelectasis.  I started her on Lasix 40 mg daily and potassium chloride 20 mill equivalents daily.  She returns today for follow-up and said that she has continued to have more shortness of breath.  She has had some fever to 102 in the evening.  She has not been coughing up any sputum.  She continues to have some pleuritic right chest pain.   Current Outpatient Medications  Medication Sig Dispense Refill   acetaminophen (TYLENOL) 325 MG tablet Take 2 tablets (650 mg total) by mouth every 6 (six) hours as needed for mild pain.     ALPRAZolam (XANAX) 0.25 MG tablet Take 1 tablet (0.25 mg total) by mouth 2 (two) times daily as needed. 60 tablet 5   amLODipine (NORVASC) 5 MG tablet Take 1 tablet (5 mg total) by mouth daily. 30 tablet 1   aspirin EC 325 MG tablet Take 1 tablet (325 mg total) by mouth daily.     blood glucose meter kit and supplies KIT Dispense based on patient and insurance preference. Use up to four times daily as directed. 1 each 0   carvedilol (COREG) 25 MG tablet Take one tablet  by mouth 2 (two) times daily with a meal. 180 tablet 1   estradiol (ESTRACE) 0.1 MG/GM vaginal cream INSERT 1/4 APPLICATORFUL VAGINALLY TWO TIMES A WEEK AS DIRECTED (Patient taking differently: Place 1 Applicatorful vaginally once a week.) 42.5 g 5   fluticasone (FLONASE) 50 MCG/ACT nasal spray PLACE 2 SPRAYS INTO BOTH NOSTRILS DAILY. 16 g 2   furosemide (LASIX) 40 MG tablet Take 1 tablet (40 mg total) by  mouth daily. 30 tablet 0   glucose blood test strip Use up to 4 times a day as directed 100 each 0   hydrALAZINE (APRESOLINE) 25 MG tablet Take 1 tablet (25 mg total) by mouth 3 (three) times daily. 270 tablet 3   Lancets (FREESTYLE) lancets Use up to 4 times a day as directed 100 each 0   Menthol, Topical Analgesic, (BIOFREEZE EX) Apply 1 application  topically 3 (three) times daily as needed (neck pain).     metFORMIN (GLUCOPHAGE-XR) 750 MG 24 hr tablet Take 1 tablet (750 mg total) by mouth daily with breakfast. 90 tablet 1   potassium chloride SA (KLOR-CON M) 20 MEQ tablet Take 1 tablet (20 mEq total) by mouth daily. 30 tablet 0   rosuvastatin (CRESTOR) 5 MG tablet Take 1 tablet (5 mg total) by mouth at bedtime. 90 tablet 2   Semaglutide,0.25 or 0.5MG/DOS, (OZEMPIC, 0.25 OR 0.5 MG/DOSE,) 2 MG/3ML SOPN Inject 0.25 mg into the skin once a week. 3 mL 2   sodium fluoride (PREVIDENT 5000 PLUS) 1.1 % CREA dental cream Brush teeth thoroughly for 2 minutes, do not rinse afterwards (Patient taking differently: Place 1 Application onto teeth at bedtime.) 51 g 12   sodium fluoride (SF 5000 PLUS) 1.1 % CREA dental cream Brush teeth thoroughly for 2 minutes, do not rinse afterwards. 51 g 12   telmisartan (MICARDIS) 80 MG tablet Take 1 tablet (80 mg total) by  mouth daily. (Patient taking differently: Take 40-80 mg by mouth daily.) 90 tablet 1   traMADol (ULTRAM) 50 MG tablet TAKE 2 TABLETS (100 MG TOTAL) BY MOUTH EVERY 6 (SIX) HOURS AS NEEDED. 240 tablet 5   triamcinolone (NASACORT ALLERGY 24HR) 55 MCG/ACT AERO nasal inhaler Place 2 sprays into the nose daily as needed (allergies).     No current facility-administered medications for this visit.    Physical Exam: BP 110/68   Pulse (!) 105   Temp 98.3 F (36.8 C) (Oral)   Resp 20   Wt 161 lb (73 kg)   LMP  (LMP Unknown)   SpO2 92%   BMI 25.99 kg/m  She looks well. Cardiac exam shows a regular rate and rhythm with normal heart sounds.  There is no  murmur. Lung exam reveals decreased breath sounds over the right lower lobe. The chest incision is healing well and the sternum is stable. There is no peripheral edema.  Diagnostic Tests:  Narrative & Impression  CLINICAL DATA:  Status post aortic valve replacement. Chest pain and shortness of breath.   EXAM: CHEST - 2 VIEW   COMPARISON:  Chest radiograph 02/25/2022; CT angio chest 12/28/2021   FINDINGS: Postoperative changes of median sternotomy and aortic valve replacement. Stable cardiomediastinal silhouette. Interval elevation of the right hemidiaphragm and increased right basilar airspace opacity. Probable small right pleural effusion. Subsegmental atelectasis at the left lung base. Interval resolution of the left pleural effusion. No pneumothorax. Vascular stent noted in the left mid abdomen.   IMPRESSION: Interval elevation of the right hemidiaphragm with increased right basilar airspace opacity, favored to represent combination of small right pleural effusion and adjacent atelectasis, though superimposed infection cannot be excluded.   Resolution of the left pleural effusion.     Electronically Signed   By: Ileana Roup M.D.   On: 03/06/2022 16:28    Impression:  I think her chest x-ray shows increased right pleural effusion into the moderate range with right lower lobe atelectasis.  The left pleural effusion has resolved.  I reviewed the chest x-ray findings with her and her daughter.  I think a right thoracentesis is indicated at this time and hopefully this will remove all of the fluid and allow reexpansion of her right lower lobe.  Her fever is most likely related to atelectasis but she is certainly at risk for pneumonia with collapse of the right lower lobe.  We will see how the right lung looks once the fluid is drained.  We will check a CBC and BMET.  I encouraged her to continue using her incentive spirometer is much as possible.  Plan:  She will be  scheduled for right thoracentesis tomorrow.  Hopefully all of the fluid will be drained and the right lung will be reexpanded.  If not then I think we will need to get a CT scan of the chest to evaluate the pleural space.   Gaye Pollack, MD [facility] [phone]

## 2022-03-07 ENCOUNTER — Inpatient Hospital Stay (HOSPITAL_COMMUNITY): Payer: 59

## 2022-03-07 ENCOUNTER — Ambulatory Visit: Payer: 59 | Admitting: Cardiology

## 2022-03-07 ENCOUNTER — Inpatient Hospital Stay (HOSPITAL_COMMUNITY)
Admission: AD | Admit: 2022-03-07 | Discharge: 2022-03-11 | DRG: 315 | Disposition: A | Discharge status: Home / Self Care | Payer: 59 | Source: Other Acute Inpatient Hospital | Attending: Surgery | Admitting: Surgery

## 2022-03-07 ENCOUNTER — Encounter (HOSPITAL_COMMUNITY): Payer: Self-pay

## 2022-03-07 ENCOUNTER — Other Ambulatory Visit: Payer: Self-pay | Admitting: Surgery

## 2022-03-07 ENCOUNTER — Ambulatory Visit (HOSPITAL_COMMUNITY)
Admission: RE | Admit: 2022-03-07 | Discharge: 2022-03-07 | Disposition: A | Discharge status: Home / Self Care | Payer: 59 | Source: Ambulatory Visit | Attending: Surgery | Admitting: Surgery

## 2022-03-07 DIAGNOSIS — Z952 Presence of prosthetic heart valve: Secondary | ICD-10-CM

## 2022-03-07 DIAGNOSIS — D62 Acute posthemorrhagic anemia: Secondary | ICD-10-CM | POA: Diagnosis not present

## 2022-03-07 DIAGNOSIS — E1169 Type 2 diabetes mellitus with other specified complication: Secondary | ICD-10-CM | POA: Diagnosis present

## 2022-03-07 DIAGNOSIS — Z833 Family history of diabetes mellitus: Secondary | ICD-10-CM | POA: Diagnosis not present

## 2022-03-07 DIAGNOSIS — Z79899 Other long term (current) drug therapy: Secondary | ICD-10-CM

## 2022-03-07 DIAGNOSIS — I959 Hypotension, unspecified: Principal | ICD-10-CM | POA: Diagnosis present

## 2022-03-07 DIAGNOSIS — Z806 Family history of leukemia: Secondary | ICD-10-CM | POA: Diagnosis not present

## 2022-03-07 DIAGNOSIS — Z808 Family history of malignant neoplasm of other organs or systems: Secondary | ICD-10-CM | POA: Diagnosis not present

## 2022-03-07 DIAGNOSIS — I10 Essential (primary) hypertension: Secondary | ICD-10-CM | POA: Diagnosis not present

## 2022-03-07 DIAGNOSIS — I3139 Other pericardial effusion (noninflammatory): Secondary | ICD-10-CM | POA: Diagnosis not present

## 2022-03-07 DIAGNOSIS — E1121 Type 2 diabetes mellitus with diabetic nephropathy: Secondary | ICD-10-CM | POA: Diagnosis present

## 2022-03-07 DIAGNOSIS — I701 Atherosclerosis of renal artery: Secondary | ICD-10-CM | POA: Diagnosis not present

## 2022-03-07 DIAGNOSIS — Z8616 Personal history of COVID-19: Secondary | ICD-10-CM

## 2022-03-07 DIAGNOSIS — Z803 Family history of malignant neoplasm of breast: Secondary | ICD-10-CM | POA: Diagnosis not present

## 2022-03-07 DIAGNOSIS — E782 Mixed hyperlipidemia: Secondary | ICD-10-CM | POA: Diagnosis present

## 2022-03-07 DIAGNOSIS — J9811 Atelectasis: Secondary | ICD-10-CM | POA: Diagnosis present

## 2022-03-07 DIAGNOSIS — D75839 Thrombocytosis, unspecified: Secondary | ICD-10-CM | POA: Diagnosis not present

## 2022-03-07 DIAGNOSIS — I454 Nonspecific intraventricular block: Secondary | ICD-10-CM | POA: Diagnosis present

## 2022-03-07 DIAGNOSIS — Z8249 Family history of ischemic heart disease and other diseases of the circulatory system: Secondary | ICD-10-CM | POA: Diagnosis not present

## 2022-03-07 DIAGNOSIS — J90 Pleural effusion, not elsewhere classified: Secondary | ICD-10-CM | POA: Insufficient documentation

## 2022-03-07 DIAGNOSIS — R627 Adult failure to thrive: Secondary | ICD-10-CM | POA: Diagnosis not present

## 2022-03-07 DIAGNOSIS — Z8709 Personal history of other diseases of the respiratory system: Secondary | ICD-10-CM | POA: Diagnosis not present

## 2022-03-07 DIAGNOSIS — R509 Fever, unspecified: Secondary | ICD-10-CM | POA: Diagnosis present

## 2022-03-07 DIAGNOSIS — I4891 Unspecified atrial fibrillation: Secondary | ICD-10-CM | POA: Diagnosis not present

## 2022-03-07 LAB — CBC
HCT: 23.6 % — ABNORMAL LOW (ref 36.0–46.0)
Hemoglobin: 7.7 g/dL — ABNORMAL LOW (ref 12.0–15.0)
MCH: 27.8 pg (ref 26.0–34.0)
MCHC: 32.6 g/dL (ref 30.0–36.0)
MCV: 85.2 fL (ref 80.0–100.0)
Platelets: 456 10*3/uL — ABNORMAL HIGH (ref 150–400)
RBC: 2.77 MIL/uL — ABNORMAL LOW (ref 3.87–5.11)
RDW: 14.2 % (ref 11.5–15.5)
WBC: 8.9 10*3/uL (ref 4.0–10.5)
nRBC: 0 % (ref 0.0–0.2)

## 2022-03-07 LAB — CBC WITH DIFFERENTIAL/PLATELET
Absolute Monocytes: 1355 cells/uL — ABNORMAL HIGH (ref 200–950)
Basophils Absolute: 53 cells/uL (ref 0–200)
Basophils Relative: 0.5 %
Eosinophils Absolute: 305 cells/uL (ref 15–500)
Eosinophils Relative: 2.9 %
HCT: 28 % — ABNORMAL LOW (ref 35.0–45.0)
Hemoglobin: 8.9 g/dL — ABNORMAL LOW (ref 11.7–15.5)
Lymphs Abs: 1344 cells/uL (ref 850–3900)
MCH: 27.2 pg (ref 27.0–33.0)
MCHC: 31.8 g/dL — ABNORMAL LOW (ref 32.0–36.0)
MCV: 85.6 fL (ref 80.0–100.0)
MPV: 9.6 fL (ref 7.5–12.5)
Monocytes Relative: 12.9 %
Neutro Abs: 7445 cells/uL (ref 1500–7800)
Neutrophils Relative %: 70.9 %
Platelets: 574 10*3/uL — ABNORMAL HIGH (ref 140–400)
RBC: 3.27 10*6/uL — ABNORMAL LOW (ref 3.80–5.10)
RDW: 14.2 % (ref 11.0–15.0)
Total Lymphocyte: 12.8 %
WBC: 10.5 10*3/uL (ref 3.8–10.8)

## 2022-03-07 LAB — BASIC METABOLIC PANEL
BUN/Creatinine Ratio: 21 (calc) (ref 6–22)
BUN: 27 mg/dL — ABNORMAL HIGH (ref 7–25)
CO2: 27 mmol/L (ref 20–32)
Calcium: 8.9 mg/dL (ref 8.6–10.4)
Chloride: 93 mmol/L — ABNORMAL LOW (ref 98–110)
Creat: 1.31 mg/dL — ABNORMAL HIGH (ref 0.50–1.05)
Glucose, Bld: 143 mg/dL — ABNORMAL HIGH (ref 65–99)
Potassium: 4.4 mmol/L (ref 3.5–5.3)
Sodium: 133 mmol/L — ABNORMAL LOW (ref 135–146)

## 2022-03-07 LAB — COMPREHENSIVE METABOLIC PANEL
ALT: 51 U/L — ABNORMAL HIGH (ref 0–44)
AST: 70 U/L — ABNORMAL HIGH (ref 15–41)
Albumin: 2.6 g/dL — ABNORMAL LOW (ref 3.5–5.0)
Alkaline Phosphatase: 84 U/L (ref 38–126)
Anion gap: 15 (ref 5–15)
BUN: 27 mg/dL — ABNORMAL HIGH (ref 8–23)
CO2: 26 mmol/L (ref 22–32)
Calcium: 8.7 mg/dL — ABNORMAL LOW (ref 8.9–10.3)
Chloride: 91 mmol/L — ABNORMAL LOW (ref 98–111)
Creatinine, Ser: 1.72 mg/dL — ABNORMAL HIGH (ref 0.44–1.00)
GFR, Estimated: 33 mL/min — ABNORMAL LOW (ref >60)
Glucose, Bld: 131 mg/dL — ABNORMAL HIGH (ref 70–99)
Potassium: 3.6 mmol/L (ref 3.5–5.1)
Sodium: 132 mmol/L — ABNORMAL LOW (ref 135–145)
Total Bilirubin: 0.9 mg/dL (ref 0.3–1.2)
Total Protein: 5.9 g/dL — ABNORMAL LOW (ref 6.5–8.1)

## 2022-03-07 LAB — ECHOCARDIOGRAM LIMITED
AR max vel: 1.59 cm2
AV Area VTI: 1.86 cm2
AV Area mean vel: 1.77 cm2
AV Mean grad: 8 mmHg
AV Peak grad: 15.7 mmHg
Ao pk vel: 1.98 m/s
Area-P 1/2: 3.72 cm2

## 2022-03-07 LAB — MRSA NEXT GEN BY PCR, NASAL: MRSA by PCR Next Gen: NOT DETECTED

## 2022-03-07 LAB — GLUCOSE, CAPILLARY: Glucose-Capillary: 154 mg/dL — ABNORMAL HIGH (ref 70–99)

## 2022-03-07 MED ORDER — ONDANSETRON HCL 4 MG/2ML IJ SOLN: 4.0000 mg | Freq: Three times a day (TID) | INTRAMUSCULAR | Status: DC | PRN

## 2022-03-07 MED ORDER — ALPRAZOLAM 0.25 MG PO TABS
0.2500 mg | ORAL_TABLET | Freq: Two times a day (BID) | ORAL | Status: DC | PRN
  Administered 2022-03-07 – 2022-03-10 (×4): 0.25 mg via ORAL
  Filled 2022-03-07 (×4): qty 1

## 2022-03-07 MED ORDER — FE FUMARATE-B12-VIT C-FA-IFC PO CAPS
1.0000 | ORAL_CAPSULE | Freq: Two times a day (BID) | ORAL | Status: DC
  Filled 2022-03-07: qty 1

## 2022-03-07 MED ORDER — ASPIRIN 81 MG PO TBEC
81.0000 mg | DELAYED_RELEASE_TABLET | Freq: Every day | ORAL | Status: DC
  Administered 2022-03-08 – 2022-03-11 (×4): 81 mg via ORAL
  Filled 2022-03-07 (×4): qty 1

## 2022-03-07 MED ORDER — HYDROCOD POLI-CHLORPHE POLI ER 10-8 MG/5ML PO SUER
5.0000 mL | Freq: Two times a day (BID) | ORAL | Status: DC | PRN
  Administered 2022-03-09 (×2): 5 mL via ORAL
  Filled 2022-03-07 (×2): qty 5

## 2022-03-07 MED ORDER — TRAMADOL HCL 50 MG PO TABS
50.0000 mg | ORAL_TABLET | Freq: Four times a day (QID) | ORAL | Status: DC | PRN
  Administered 2022-03-07 – 2022-03-10 (×9): 100 mg via ORAL
  Filled 2022-03-07 (×9): qty 2

## 2022-03-07 MED ORDER — DOCUSATE SODIUM 100 MG PO CAPS: 100.0000 mg | ORAL_CAPSULE | Freq: Two times a day (BID) | ORAL | Status: DC | PRN

## 2022-03-07 MED ORDER — PIPERACILLIN-TAZOBACTAM 3.375 G IVPB
3.3750 g | Freq: Three times a day (TID) | INTRAVENOUS | Status: DC
  Administered 2022-03-08 – 2022-03-09 (×4): 3.375 g via INTRAVENOUS
  Filled 2022-03-07 (×4): qty 50

## 2022-03-07 MED ORDER — FLUTICASONE PROPIONATE 50 MCG/ACT NA SUSP
2.0000 | Freq: Every day | NASAL | Status: DC
  Administered 2022-03-08 – 2022-03-11 (×4): 2 via NASAL
  Filled 2022-03-07 (×2): qty 16

## 2022-03-07 MED ORDER — METFORMIN HCL ER 750 MG PO TB24: 750.0000 mg | ORAL_TABLET | Freq: Every day | ORAL | Status: DC

## 2022-03-07 MED ORDER — ENOXAPARIN SODIUM 30 MG/0.3ML IJ SOSY
30.0000 mg | PREFILLED_SYRINGE | INTRAMUSCULAR | Status: DC
  Administered 2022-03-07 – 2022-03-10 (×4): 30 mg via SUBCUTANEOUS
  Filled 2022-03-07 (×4): qty 0.3

## 2022-03-07 MED ORDER — PIPERACILLIN-TAZOBACTAM 3.375 G IVPB 30 MIN
3.3750 g | Freq: Once | INTRAVENOUS | Status: AC
  Administered 2022-03-07: 3.375 g via INTRAVENOUS
  Filled 2022-03-07 (×2): qty 50

## 2022-03-07 MED ORDER — INSULIN ASPART 100 UNIT/ML IJ SOLN
0.0000 [IU] | Freq: Three times a day (TID) | INTRAMUSCULAR | Status: DC
  Administered 2022-03-08: 1 [IU] via SUBCUTANEOUS
  Administered 2022-03-08 – 2022-03-10 (×4): 2 [IU] via SUBCUTANEOUS
  Administered 2022-03-10: 1 [IU] via SUBCUTANEOUS

## 2022-03-07 MED ORDER — VANCOMYCIN HCL 1500 MG/300ML IV SOLN
1500.0000 mg | Freq: Once | INTRAVENOUS | Status: AC
  Administered 2022-03-07: 1500 mg via INTRAVENOUS
  Filled 2022-03-07: qty 300

## 2022-03-07 MED ORDER — ASPIRIN 325 MG PO TABS: 325.0000 mg | ORAL_TABLET | Freq: Every day | ORAL | Status: DC

## 2022-03-07 MED ORDER — ROSUVASTATIN CALCIUM 5 MG PO TABS
5.0000 mg | ORAL_TABLET | Freq: Every day | ORAL | Status: DC
  Administered 2022-03-07 – 2022-03-10 (×4): 5 mg via ORAL
  Filled 2022-03-07 (×4): qty 1

## 2022-03-07 MED ORDER — LIDOCAINE HCL 1 % IJ SOLN
INTRAMUSCULAR | Status: AC
  Filled 2022-03-07: qty 20

## 2022-03-07 MED ORDER — ACETAMINOPHEN 325 MG PO TABS
650.0000 mg | ORAL_TABLET | Freq: Four times a day (QID) | ORAL | Status: DC | PRN
  Administered 2022-03-08 – 2022-03-09 (×2): 650 mg via ORAL
  Filled 2022-03-07 (×2): qty 2

## 2022-03-07 MED ORDER — VANCOMYCIN HCL 750 MG/150ML IV SOLN
750.0000 mg | INTRAVENOUS | Status: AC
  Administered 2022-03-08: 750 mg via INTRAVENOUS
  Filled 2022-03-07: qty 150

## 2022-03-07 NOTE — Progress Notes (Signed)
  Echocardiogram 2D Echocardiogram has been performed.  Johny Chess 03/07/2022, 5:47 PM

## 2022-03-07 NOTE — Progress Notes (Signed)
Courtney Courtney Lopez is 61 yo female who underwent AVR on 02/14/22, she has been suffering from intermittent SOB and chest pain.  Series of CXR were obtained which showed worsening of right pleural effusion, therefore patient was referred to IR for right therapeutic thoracentesis.  Patient presented for thoracentesis today, sitting in a stretcher, NAD.  Initial BP taken from left arm in sitting position was 82/51, other VS stable.  Repeat BP 66/45, BR from right arm  in sitting position: 68/44. HR remained in upper 80s, O2 sat remained 99-100% in room air.  Patient states that she only took carvedilol 25 mg at 4 am today.    Patient denied chest palpitation or SOB but reported generalized weakness and malaise for past week.  Patient placed in semi upright position, pt could not tolerate supine position due to post surgical chest pain. Semi supine right arm BP: 96/61, pt remained in semisupine position for 15-20 minutes.  Sitting position right arm BP: 79/56  Informed the patient that it would be unsafe to perform thoracentesis as it can worsen hypotension, also informed that patient may need emergent evaluation in ED. Courtney Courtney Lopez stated that she would like to avoid going to ED if possible, and she would like to speak with Dr. Cyndia Lopez and obtain his input.   Spoke with Courtney Handler PA from [facility], it appears that hypotension is most likely due to medications.  Per Courtney Courtney Lopez, no need to go to ED if patient is not very symptomatic, patient to stop all of her HTN medications for today, and Dr. Vivi Lopez office will call the patient tomorrow AM to follow up.  Informed Courtney Lopez above recommendations, she agreed.  Courtney Courtney Lopez was recommended to keep herself well hydrated, stay in supine/semisupine position as mush as possible, also be cautious with ambulation/changing position to prevent fall.  Courtney Courtney Lopez was encouraged to seek immediate medical attention today if she develops worsening of  shortness of breath, lightheadedness, and fatigue.  She verbalized understanding.   Courtney Courtney Lopez was informed that she can call [facility] IR tomorrow to schedule her thoracentesis, if her BP remains stable after discontinuing HTN medications today.   Thoracentesis was NOT performed today due to hypotension. Please call IR for questions and concerns.   Courtney Gang Torsha Lemus PA-C 03/07/2022 9:43 AM

## 2022-03-07 NOTE — Progress Notes (Signed)
Pharmacy Antibiotic Note  Courtney Lopez is a 61 y.o. female admitted on 03/07/2022 with sepsis.  Pharmacy has been consulted for vancomycin/Zosyn dosing. Noted AKI - SCr 1.72 on presentation (baseline appears ~0.8-1.2).  Plan: Zosyn 3.375g IV x 1 (31min infusion); then 3.375g IV q8h (4h infusion) Vancomycin 1500mg  IV x 1; then 750mg  IV q24h. Goal AUC 400-550. Expected AUC: 470 SCr used: 1.72 Monitor clinical progress, c/s, renal function F/u de-escalation plan/LOT, vancomycin levels as indicated      Temp (24hrs), Avg:98.6 F (37 C), Min:98.6 F (37 C), Max:98.6 F (37 C)  No results for input(s): "WBC", "CREATININE", "LATICACIDVEN", "VANCOTROUGH", "VANCOPEAK", "VANCORANDOM", "GENTTROUGH", "GENTPEAK", "GENTRANDOM", "TOBRATROUGH", "TOBRAPEAK", "TOBRARND", "AMIKACINPEAK", "AMIKACINTROU", "AMIKACIN" in the last 168 hours.  Estimated Creatinine Clearance: 51.2 mL/min (A) (by C-G formula based on SCr of 1.15 mg/dL (H)).    Allergies  Allergen Reactions   Hctz [Hydrochlorothiazide] Other (See Comments)    Decreased gfr   Prozac [Fluoxetine Hcl] Palpitations    Antimicrobials this admission: 10/26 vancomycin >>  10/26 Zosyn >>   Dose adjustments this admission:   Microbiology results:   Arturo Morton, PharmD, BCPS Please check AMION for all [place] contact numbers Clinical Pharmacist 03/07/2022 5:42 PM

## 2022-03-07 NOTE — H&P (Addendum)
[address]       [place] [address]             [phone]     Subjective:    Courtney Lopez is a 61 yo female with history of  hypertension, diabetes, renal artery stenosis felt to be secondary to fibromuscular dysplasia, bicuspid aortic valve disease, and an ascending aortic aneurysm.  She is S/P Supra Coronary Replacement of her Ascending Aorta and replacement of her AVR by Dr. Cyndia Bent on 02/14/2022.  She was initially evaluated by Dr. Cyndia Bent on 10/16 at which time she was noted to have bilateral pleural effusions and atelectasis.  She was give Lasix and potassium at that time.  At her follow up yesterday the patient admitted to being more short of breath with fever of up to 102 at times.  She denies respiratory symptoms, but continued to have pleuritic right sided chest pain.  Workup showed increase in her right sided pleural effusion.  She was referred for Thoracentesis which was scheduled for this morning.  Unfortunately on arrival the patient was hypotensive with systolics dropping into the 60s.   Overall the patient denied chest pain and palpitations at that time.  She did complain of generalized weakness and fatigue for the past week. Her thoracentesis was not performed.  It was felt she should be admitted to the hospital for more thorough workup.  Currently, the patient has some shortness of breath.  She has not taken any further BP medications today, other than her morning dose of Coreg.  She again states she has felt weak and run down for the past week.  Fever persists in the evening.    Patient Active Problem List   Diagnosis Date Noted   S/P AVR (aortic valve replacement) 02/14/2022   Hypertensive urgency 01/17/2022   Near syncope 01/17/2022   Contact dermatitis and eczema due to plant 09/23/2021   Acute sinusitis 09/21/2021   RAS (renal artery stenosis) ([place]) 03/28/2021   Ankle injury, sequela 06/07/2020   Personal history of COVID-19 05/28/2020    Diabetic nephropathy associated with type 2 diabetes mellitus ([place]) 10/21/2019   Renal artery stenosis ([place]) 10/27/2018   Hypertensive emergency    Thoracic aortic aneurysm without rupture ([place])    Chest pain 03/10/2018   Paroxysmal tachycardia ([place]) 08/12/2017   Thyroid nodule 05/15/2017   Ascending aorta dilatation ([place]) 05/27/2016   Bicuspid aortic valve 05/27/2016   Encounter for preventive health examination 10/29/2015   Adjustment disorder with anxious mood 06/13/2014   DM type 2 with diabetic mixed hyperlipidemia ([place]) 03/25/2012   Aortic stenosis    Screening for colon cancer 08/25/2011   Screening for breast cancer 08/25/2011   Overweight 08/25/2011   Hyperlipidemia associated with type 2 diabetes mellitus ([place]) 08/25/2011   Cervical spine disease 06/18/2011   Hypertension 06/18/2011   Past Medical History:  Diagnosis Date   Ankle fracture 12/31/2018   left   Anxiety    Arthritis    neck, back, ankle, knees   Ascending aortic aneurysm ([place])    a. 04/2016 CTA: 4.6cm Asc TAA; b. 04/2018 CTA: 4.8cm; c. 10/2018 CTA: 4.9x4.8cm; d. 12/2019 Echo: 4.8 cm.   Bicuspid aortic valve    Breast screening, unspecified    Carotid arterial disease ([place])    a. 01/2020 U/S: 1-39% bilat ICA stenosis. No evidence of FMD.   Cervicalgia    cervical stenosis   Diabetes mellitus without complication (HCC)    Elevated coronary  artery calcium score    a. 03/2016 Cardiac CT: Ca2+ = 78 (90th %'ile).   Fibromuscular dysplasia (HCC)    Heart murmur    History of hiatal hernia    Metatarsal fracture 2007   5th, from wearing high heels   Moderate aortic stenosis    a. 12/2019 Echo: EF 60-65%, no rwma, mild LVH, gr1 DD, nl RV size/fxn, triv MR, mild AI, mod AS, mod-sev dil of Asc Ao - 60mm.  AoV reported as tricuspid but prev known to be bicuspid.   Multiple thyroid nodules    a. 10/2019 U/S: R mid thyroid nodule - f/u 5 yrs.   Other symptoms involving abdomen and pelvis(789.9)    Renal artery  stenosis ([place])    a. 12/2019 Renal duplex: Nl RRA. LRA >60% stenosis.   Renal cyst, right    a. 12/2019 Duplex: 5.8 x 5.7 cm R renal cyst in lower pole.   Retinal hemorrhage    Symptomatic menopausal or female climacteric states    Unspecified essential hypertension    Wears contact lenses     Past Surgical History:  Procedure Laterality Date   ABDOMINAL HYSTERECTOMY     ANKLE ARTHROSCOPY WITH RECONSTRUCTION Left 08/24/2020   Procedure: LEFT ANKLE ARTHROSCOPIC DEBRIDEMENT, EXTENSIVE, ARTHOSCROPIC TREATMENT OF TALUS OSTEOCHONDRAL LESION, LATERAL LIGAMENT RECONSTRUCTION;  Surgeon: Erle Crocker, MD;  Location: [facility];  Service: Orthopedics;  Laterality: Left;   AORTIC VALVE REPLACEMENT N/A 02/14/2022   Procedure: AORTIC VALVE REPLACEMENT (AVR) USING EDWARDS INSPIRIS 23 MM RESILIA AORTIC VALVE;  Surgeon: Gaye Pollack, MD;  Location: [facility];  Service: Open Heart Surgery;  Laterality: N/A;   APPENDECTOMY     done during oophorectomy   BENTALL PROCEDURE N/A 02/14/2022   Procedure: possible BENTALL PROCEDURE;  Surgeon: Gaye Pollack, MD;  Location: [facility];  Service: Open Heart Surgery;  Laterality: N/A;   CESAREAN SECTION     x 2   ETHMOIDECTOMY Left 03/02/2020   Procedure: ETHMOIDECTOMY;  Surgeon: Margaretha Sheffield, MD;  Location: [facility];  Service: ENT;  Laterality: Left;   gyn surgery     hysterectomy- done for adenomysosis and endometriosis   IMAGE GUIDED SINUS SURGERY N/A 03/02/2020   Procedure: IMAGE GUIDED SINUS SURGERY;  Surgeon: Margaretha Sheffield, MD;  Location: [facility];  Service: ENT;  Laterality: N/A;  need disk PLACED DISK ON OR CHARGE NURSE DESK 9-17 KP   IMAGE GUIDED SINUS SURGERY Left 04/13/2020   Procedure: IMAGE GUIDED SINUS SURGERY;  Surgeon: Margaretha Sheffield, MD;  Location: [facility];  Service: ENT;  Laterality: Left;  needs stryker disk per Justice Rocher use same disk as before (October) put on charge nurses desk. DS 04/03/20    MAXILLARY ANTROSTOMY Left 03/02/2020   Procedure: MAXILLARY ANTROSTOMY;  Surgeon: Margaretha Sheffield, MD;  Location: [facility];  Service: ENT;  Laterality: Left;   MAXILLARY ANTROSTOMY Left 04/13/2020   Procedure: MAXILLECTOMY;  Surgeon: Margaretha Sheffield, MD;  Location: [facility];  Service: ENT;  Laterality: Left;   OOPHORECTOMY     unilateral, due to ruptured ovarian cysts   OSTEOTOMY MAXILLARY     due to bite problems   PERIPHERAL VASCULAR INTERVENTION  03/28/2021   Procedure: PERIPHERAL VASCULAR INTERVENTION;  Surgeon: Wellington Hampshire, MD;  Location: [facility];  Service: Cardiovascular;;   RENAL ANGIOGRAPHY N/A 03/28/2021   Procedure: RENAL ANGIOGRAPHY;  Surgeon: Wellington Hampshire, MD;  Location: [facility];  Service: Cardiovascular;  Laterality: N/A;   REPLACEMENT ASCENDING AORTA N/A 02/14/2022   Procedure: REPLACEMENT ASCENDING AORTIC ANEURYSM HEMASHIELD PLATINUM 30 MM GRAFT;  Surgeon: Gaye Pollack, MD;  Location: [facility];  Service: Open Heart Surgery;  Laterality: N/A;  CIRC ARREST   RIGHT/LEFT HEART CATH AND CORONARY ANGIOGRAPHY Bilateral 01/28/2022   Procedure: RIGHT/LEFT HEART CATH AND CORONARY ANGIOGRAPHY;  Surgeon: Wellington Hampshire, MD;  Location: [facility];  Service: Cardiovascular;  Laterality: Bilateral;   TEE WITHOUT CARDIOVERSION N/A 02/14/2022   Procedure: TRANSESOPHAGEAL ECHOCARDIOGRAM (TEE);  Surgeon: Gaye Pollack, MD;  Location: [facility];  Service: Open Heart Surgery;  Laterality: N/A;   TUBAL LIGATION      No medications prior to admission.   Allergies  Allergen Reactions   Hctz [Hydrochlorothiazide] Other (See Comments)    Decreased gfr   Prozac [Fluoxetine Hcl] Palpitations    Social History   Tobacco Use   Smoking status: Never   Smokeless tobacco: Never  Substance Use Topics   Alcohol use: Not Currently    Family History  Problem Relation Age of Onset   Diabetes Mother    Aortic stenosis Mother    Cancer Father         leukemia   Retinoblastoma Son    Melanoma Son    Cancer Maternal Aunt        breast   Breast cancer Maternal Aunt 28   Cancer Grandchild 1       retinoblastoma   Cancer Granddaughter     Review of Systems + fever, fatigue, weakness, dyspnea, Denies chest pain, palpitation  Objective:    General appearance: alert, cooperative, and no distress Lungs: diminished breath sounds on right Heart: regular rate and rhythm Abdomen: soft, non-tender; bowel sounds normal; no masses,  no organomegaly Extremities: extremities normal, atraumatic, no cyanosis or edema Skin: sternotomy healing w/o evidence of infection   A/P:  S/P Supra Coronary Replacement of Ascending Aorta and Aortic Valve Replacement Right Pleural effusion Thoracentesis H/O HTN- hypotensive this morning, she did take AM dose of Carvedilol   Overall patient is not thriving post surgery.  There is some concern due to the complexity of her original surgery she could have a Pericardial Effusion.  She continues to have a right pleural effusion, which was unable to be drained earlier today due to hypotension.  She also has a fever at times...  Best course of action is to admit patient for STAT Echocardiogram to rule out Pericardial tamponade.  She will be kept NPO at this time, in case surgical intervention is needed.  Will get IR consult for US guided thoracentesis.  Will obtain labs and hold off on ABX at this time.. These can be started if clinically indicated.  This plan was from Dr. Cyndia Bent and he is aware of admission  Ellwood Handler, PA-C 4:53 PM 03/07/22   Chart reviewed, patient examined, agree with above. I sent her for a right thoracentesis this am but she was hypotensive and orthostatic and it was not performed. She had taken her Coreg this am. She went home after the aborted procedure. I felt that the best option was to admit her for an urgent echo to rule out a significant pericardial effusion and tamponade  with her recent surgery and Dacron graft repair of her aortic aneurysm. I have just reviewed her echo at the bedside and it looks good. There is no significant pericardial effusion, aortic valve prosthesis is functioning normally with a mean gradient of 8  mm Hg and no AI. LV and RV function look normal. I don't see any signs of vegetation. I will send labs, blood cultures and start empiric vanc and zosyn with recent fevers to 102 two days ago and 101.5 last night. These fevers may be due to her right pleural effusion and atelectasis but she could also have a pneumonia. Plan to proceed with US guided right thoracentesis by IR tomorrow. No sign of wound infection.

## 2022-03-08 ENCOUNTER — Other Ambulatory Visit (HOSPITAL_COMMUNITY): Payer: Self-pay

## 2022-03-08 ENCOUNTER — Inpatient Hospital Stay (HOSPITAL_COMMUNITY): Payer: 59

## 2022-03-08 HISTORY — PX: IR THORACENTESIS ASP PLEURAL SPACE W/IMG GUIDE: IMG5380

## 2022-03-08 LAB — CBC
HCT: 24.3 % — ABNORMAL LOW (ref 36.0–46.0)
Hemoglobin: 7.6 g/dL — ABNORMAL LOW (ref 12.0–15.0)
MCH: 26.8 pg (ref 26.0–34.0)
MCHC: 31.3 g/dL (ref 30.0–36.0)
MCV: 85.6 fL (ref 80.0–100.0)
Platelets: 497 10*3/uL — ABNORMAL HIGH (ref 150–400)
RBC: 2.84 MIL/uL — ABNORMAL LOW (ref 3.87–5.11)
RDW: 14.5 % (ref 11.5–15.5)
WBC: 8 10*3/uL (ref 4.0–10.5)
nRBC: 0 % (ref 0.0–0.2)

## 2022-03-08 LAB — BASIC METABOLIC PANEL
Anion gap: 13 (ref 5–15)
BUN: 24 mg/dL — ABNORMAL HIGH (ref 8–23)
CO2: 26 mmol/L (ref 22–32)
Calcium: 8.8 mg/dL — ABNORMAL LOW (ref 8.9–10.3)
Chloride: 97 mmol/L — ABNORMAL LOW (ref 98–111)
Creatinine, Ser: 1.52 mg/dL — ABNORMAL HIGH (ref 0.44–1.00)
GFR, Estimated: 38 mL/min — ABNORMAL LOW (ref >60)
Glucose, Bld: 136 mg/dL — ABNORMAL HIGH (ref 70–99)
Potassium: 3.3 mmol/L — ABNORMAL LOW (ref 3.5–5.1)
Sodium: 136 mmol/L (ref 135–145)

## 2022-03-08 LAB — GLUCOSE, CAPILLARY
Glucose-Capillary: 128 mg/dL — ABNORMAL HIGH (ref 70–99)
Glucose-Capillary: 130 mg/dL — ABNORMAL HIGH (ref 70–99)
Glucose-Capillary: 136 mg/dL — ABNORMAL HIGH (ref 70–99)

## 2022-03-08 MED ORDER — FERROUS FUMARATE 324 (106 FE) MG PO TABS
1.0000 | ORAL_TABLET | Freq: Every day | ORAL | Status: DC
  Administered 2022-03-08 – 2022-03-11 (×4): 106 mg via ORAL
  Filled 2022-03-08 (×4): qty 1

## 2022-03-08 MED ORDER — AMOXICILLIN-POT CLAVULANATE 875-125 MG PO TABS: 1.0000 | ORAL_TABLET | Freq: Two times a day (BID) | ORAL | Status: DC

## 2022-03-08 MED ORDER — CHLORHEXIDINE GLUCONATE CLOTH 2 % EX PADS
6.0000 | MEDICATED_PAD | Freq: Every day | CUTANEOUS | Status: DC
  Administered 2022-03-08 – 2022-03-09 (×2): 6 via TOPICAL

## 2022-03-08 MED ORDER — ORAL CARE MOUTH RINSE: 15.0000 mL | OROMUCOSAL | Status: DC | PRN

## 2022-03-08 MED ORDER — LIDOCAINE HCL 1 % IJ SOLN
INTRAMUSCULAR | Status: AC
  Filled 2022-03-08: qty 20

## 2022-03-08 MED ORDER — AMOXICILLIN-POT CLAVULANATE 875-125 MG PO TABS
1.0000 | ORAL_TABLET | Freq: Two times a day (BID) | ORAL | 0 refills | Status: DC
  Filled 2022-03-08: qty 14, 7d supply, fill #0

## 2022-03-08 MED ORDER — ASPIRIN 81 MG PO TBEC
81.0000 mg | DELAYED_RELEASE_TABLET | Freq: Every day | ORAL | 12 refills | Status: AC
  Filled 2022-03-08 – 2022-04-11 (×2): qty 30, 30d supply, fill #0

## 2022-03-08 MED ORDER — POTASSIUM CHLORIDE CRYS ER 20 MEQ PO TBCR
20.0000 meq | EXTENDED_RELEASE_TABLET | Freq: Two times a day (BID) | ORAL | Status: AC
  Administered 2022-03-08 (×2): 20 meq via ORAL
  Filled 2022-03-08 (×2): qty 1

## 2022-03-08 MED ORDER — FA-PYRIDOXINE-CYANOCOBALAMIN 2.5-25-2 MG PO TABS
1.0000 | ORAL_TABLET | Freq: Every day | ORAL | Status: DC
  Administered 2022-03-08 – 2022-03-11 (×4): 1 via ORAL
  Filled 2022-03-08 (×4): qty 1

## 2022-03-08 NOTE — TOC Transition Note (Signed)
Discharge medications (2) are being stored in the main pharmacy on the ground floor until patient is ready for discharge.   

## 2022-03-08 NOTE — Progress Notes (Signed)
During ambulation to the restroom, pt's IV bag got turned upside down and fluids back flowed into bag. Medications administered per order with longer administration time due to this. Pt has no concerns or complaints. No concerning findings noted upon assessment.

## 2022-03-08 NOTE — Discharge Summary (Signed)
[name]    Physician Discharge Summary  Patient ID: Courtney Lopez MRN: [id number] DOB/AGE: 1960-12-06 61 y.o.  Admit date: 03/07/2022 Discharge date: 03/13/2022  Admission Diagnoses:  Patient Active Problem List   Diagnosis Date Noted   Failure to thrive in adult 03/07/2022   S/P AVR (aortic valve replacement) 02/14/2022   Hypertensive urgency 01/17/2022   Near syncope 01/17/2022   Contact dermatitis and eczema due to plant 09/23/2021   Acute sinusitis 09/21/2021   RAS (renal artery stenosis) ([place]) 03/28/2021   Ankle injury, sequela 06/07/2020   Personal history of COVID-19 05/28/2020   Diabetic nephropathy associated with type 2 diabetes mellitus ([place]) 10/21/2019   Renal artery stenosis ([place]) 10/27/2018   Hypertensive emergency    Thoracic aortic aneurysm without rupture ([place])    Chest pain 03/10/2018   Paroxysmal tachycardia ([place]) 08/12/2017   Thyroid nodule 05/15/2017   Ascending aorta dilatation ([place]) 05/27/2016   Bicuspid aortic valve 05/27/2016   Encounter for preventive health examination 10/29/2015   Adjustment disorder with anxious mood 06/13/2014   DM type 2 with diabetic mixed hyperlipidemia ([place]) 03/25/2012   Aortic stenosis    Screening for colon cancer 08/25/2011   Screening for breast cancer 08/25/2011   Overweight 08/25/2011   Hyperlipidemia associated with type 2 diabetes mellitus ([place]) 08/25/2011   Cervical spine disease 06/18/2011   Hypertension 06/18/2011     Discharge Diagnoses:  Patient Active Problem List   Diagnosis Date Noted   Failure to thrive in adult 03/07/2022   S/P AVR (aortic valve replacement) 02/14/2022   Hypertensive urgency 01/17/2022   Near syncope 01/17/2022   Contact dermatitis and eczema due to plant 09/23/2021   Acute sinusitis 09/21/2021   RAS (renal artery stenosis) ([place]) 03/28/2021   Ankle injury, sequela 06/07/2020   Personal  history of COVID-19 05/28/2020   Diabetic nephropathy associated with type 2 diabetes mellitus ([place]) 10/21/2019   Renal artery stenosis ([place]) 10/27/2018   Hypertensive emergency    Thoracic aortic aneurysm without rupture ([place])    Chest pain 03/10/2018   Paroxysmal tachycardia ([place]) 08/12/2017   Thyroid nodule 05/15/2017   Ascending aorta dilatation ([place]) 05/27/2016   Bicuspid aortic valve 05/27/2016   Encounter for preventive health examination 10/29/2015   Adjustment disorder with anxious mood 06/13/2014   DM type 2 with diabetic mixed hyperlipidemia ([place] [place]) 03/25/2012   Aortic stenosis    Screening for colon cancer 08/25/2011   Screening for breast cancer 08/25/2011   Overweight 08/25/2011   Hyperlipidemia associated with type 2 diabetes mellitus ([place]) 08/25/2011   Cervical spine disease 06/18/2011   Hypertension 06/18/2011     Discharged Condition: good  History of Present Illness:  Courtney Lopez is a 61 yo female with history of  hypertension, diabetes, renal artery stenosis felt to be secondary to fibromuscular dysplasia, bicuspid aortic valve disease, and an ascending aortic aneurysm.  She is S/P Supra Coronary Replacement of her Ascending Aorta and replacement of her AVR by Dr. Cyndia Bent on 02/14/2022.  She was initially evaluated by Dr. Cyndia Bent on 10/16 at which time she was noted to have bilateral pleural effusions and atelectasis.  She was give Lasix and potassium at that time.  At her follow up yesterday the patient admitted to being more short of breath with fever of up to 102 at times.  She denies  respiratory symptoms, but continued to have pleuritic right sided chest pain.  Workup showed increase in her right sided pleural effusion.  She was referred for Thoracentesis which was scheduled for this morning.  Unfortunately on arrival the patient was hypotensive with systolics dropping into the 60s.   Overall the patient denied chest pain and palpitations at that time.  She did  complain of generalized weakness and fatigue for the past week. Her thoracentesis was not performed.  It was felt she should be admitted to the hospital for more thorough workup.  Currently, the patient has some shortness of breath.  She has not taken any further BP medications today, other than her morning dose of Coreg.  She again states she has felt weak and run down for the past week.  Fever persists in the evening.    Hospital Course:  The patient had Echocardiogram which showed no evidence of cardiac tamponade.  Her Aortic Valve was functioning normally.  She underwent Right sided thoracentesis with removal of 1L of amber fluid.  She was started on empiric Vanc and Zosyn for fevers prior to admission.  She had no acute signs/symptoms of infection.  She was transitioned from IV antibiotics to oral Augmentin. Because of improved BP, she was put on Lopressor 25 mg bid. Her creatinine was slightly increased to 1.25. She was felt stable for transfer from the ICU to 4E for further convalescence. Her creatinine decreased to 1.07. She went into a fib with RVR 10/28. She was put on an Amiodarone drip. Because of swelling and pain, at left antecubital IV, Amiodarone was stopped. She was put on oral Amiodarone. She was more hypertensive so Amlodipine was restarted. She has had anemia and was put on ferrous fumarate. Last H and H was up to 8.6 and 26.8. She has been tolerating a diet and is ambulating on room air with good oxygenation. She is felt stable for discharge today.  Consults:  IR  Significant Diagnostic Studies: Echocardiogram-showed trivial pericardial effusion with no tamponade, normal RV and LV.    Discharge Exam: Blood pressure 138/78, pulse 91, temperature 98.6 F (37 C), temperature source Oral, resp. rate 19, weight 72.9 kg, SpO2 96 %.   General appearance: alert, cooperative, and no distress Heart: regular rate and rhythm Lungs: Diminished in the right base Abdomen: Benign  exam Extremities: No edema Wound: Incisions healing well  Discharge Medications:     Discharge Instructions     Discharge patient   Complete by: As directed    Discharge disposition: 01-Home or Self Care   Discharge patient date: 03/11/2022      Allergies as of 03/11/2022       Reactions   Hctz [hydrochlorothiazide] Other (See Comments)   Decreased gfr   Prozac [fluoxetine Hcl] Palpitations        Medication List     STOP taking these medications    carvedilol 25 MG tablet Commonly known as: COREG   freestyle lancets   furosemide 40 MG tablet Commonly known as: LASIX   hydrALAZINE 25 MG tablet Commonly known as: APRESOLINE   Ozempic (0.25 or 0.5 MG/DOSE) 2 MG/3ML Sopn Generic drug: Semaglutide(0.25 or 0.5MG/DOS)   potassium chloride SA 20 MEQ tablet Commonly known as: KLOR-CON M   telmisartan 80 MG tablet Commonly known as: MICARDIS       TAKE these medications    acetaminophen 325 MG tablet Commonly known as: TYLENOL Take 2 tablets (650 mg total) by mouth every 6 (six) hours as  needed for mild pain.   ALPRAZolam 0.25 MG tablet Commonly known as: XANAX Take 1 tablet (0.25 mg total) by mouth 2 (two) times daily as needed.   amiodarone 200 MG tablet Commonly known as: PACERONE 10/30 -11/4:  Take 419m (2 tablets) by mouth two times daily  11/5 and on:  Take 4063m(2 tablets) by mouth once daily   amLODipine 5 MG tablet Commonly known as: NORVASC Take 1 tablet (5 mg total) by mouth daily. What changed:  when to take this additional instructions   amoxicillin 500 MG capsule Commonly known as: AMOXIL Take 2,000 mg by mouth See admin instructions. Take 2,000 mg by mouth one hour prior to dental procedures   amoxicillin-clavulanate 875-125 MG tablet Commonly known as: AUGMENTIN Take 1 tablet by mouth every 12 (twelve) hours.   Aspirin Low Dose 81 MG tablet Generic drug: aspirin EC Take 1 tablet (81 mg total) by mouth daily. Swallow  whole. What changed:  medication strength how much to take additional instructions   BIOFREEZE EX Apply 1 application  topically 3 (three) times daily as needed (neck pain).   estradiol 0.1 MG/GM vaginal cream Commonly known as: ESTRACE INSERT 1/4 APPLICATORFUL VAGINALLY TWO TIMES A WEEK AS DIRECTED What changed:  how much to take how to take this when to take this   fluticasone 50 MCG/ACT nasal spray Commonly known as: FLONASE PLACE 2 SPRAYS INTO BOTH NOSTRILS DAILY.   FreeStyle Freedom Lite w/Device Kit Use up to four times daily as directed. (Dispense based on patient and insurance preference. Use up to four times daily as directed.)   FREESTYLE LITE test strip Generic drug: glucose blood Use up to 4 times a day as directed   metFORMIN 750 MG 24 hr tablet Commonly known as: GLUCOPHAGE-XR Take 1 tablet (750 mg total) by mouth daily with breakfast. What changed:  how much to take when to take this additional instructions   metoprolol tartrate 25 MG tablet Commonly known as: LOPRESSOR Take 1 tablet (25 mg total) by mouth 2 (two) times daily.   rosuvastatin 5 MG tablet Commonly known as: CRESTOR Take 1 tablet (5 mg total) by mouth at bedtime.   SF 5000 Plus 1.1 % Crea dental cream Generic drug: sodium fluoride Brush teeth thoroughly for 2 minutes, do not rinse afterwards. What changed:  how much to take how to take this when to take this Another medication with the same name was removed. Continue taking this medication, and follow the directions you see here.   traMADol 50 MG tablet Commonly known as: ULTRAM TAKE 2 TABLETS (100 MG TOTAL) BY MOUTH EVERY 6 (SIX) HOURS AS NEEDED. What changed:  how much to take how to take this when to take this reasons to take this        Follow-up Information     BaGaye PollackMD Follow up on 03/14/2022.   Specialty: Cardiothoracic Surgery Why: Appointment is at 11:30 Contact information: [address] up on 03/14/2022.   Why: Appointment is at 10:30 for chest xray Contact information: [address]              Signed:  WaJohn GiovanniPAHershal Coria11/05/2021, 1:51 PM

## 2022-03-08 NOTE — Progress Notes (Signed)
TCTS   Subjective:  She had right thoracentesis this am removing 1L of amber colored fluid. CXR afterwards looked much better.  Objective: Vital signs in last 24 hours: Temp:  [98.5 F (36.9 C)-99.2 F (37.3 C)] 98.7 F (37.1 C) (10/27 1159) Pulse Rate:  [86-104] 103 (10/27 1400) Cardiac Rhythm: Sinus tachycardia (10/27 1047) Resp:  [15-28] 16 (10/27 1400) BP: (93-135)/(61-87) 128/86 (10/27 1400) SpO2:  [86 %-98 %] 97 % (10/27 1400) Weight:  [72.9 kg] 72.9 kg (10/26 1700)  Hemodynamic parameters for last 24 hours:    Intake/Output from previous day: No intake/output data recorded. Intake/Output this shift: Total I/O In: 649.8 [P.O.:565; IV Piggyback:84.8] Out: -   General appearance: alert and cooperative Neurologic: intact Heart: regular rate and rhythm Lungs: diminished breath sounds right base Extremities: no edema Wound: incision healing well  Lab Results: Recent Labs    03/07/22 1640 03/08/22 0416  WBC 8.9 8.0  HGB 7.7* 7.6*  HCT 23.6* 24.3*  PLT 456* 497*   BMET:  Recent Labs    03/07/22 1640 03/08/22 0416  NA 132* 136  K 3.6 3.3*  CL 91* 97*  CO2 26 26  GLUCOSE 131* 136*  BUN 27* 24*  CREATININE 1.72* 1.52*  CALCIUM 8.7* 8.8*    PT/INR: No results for input(s): "LABPROT", "INR" in the last 72 hours. ABG    Component Value Date/Time   PHART 7.386 02/14/2022 2315   HCO3 20.2 02/14/2022 2315   TCO2 21 (L) 02/14/2022 2315   ACIDBASEDEF 4.0 (H) 02/14/2022 2315   O2SAT 61.4 02/15/2022 0438   CBG (last 3)  Recent Labs    03/08/22 0631 03/08/22 0824 03/08/22 1158  GLUCAP 128* 130* 136*    Assessment/Plan:  Echo reviewed and looked good with normal aortic valve function, trivial pericardial effusion with no tamponade , normal RV and LV.  Successful right thoracentesis today. Continue IS. I suspect this is inflammatory after cardiac surgery.  Afebrile with normal WBC ct. I put on empiric IV antibiotics on admission. Blood cultures  negative so far. Will plan to send home on oral antibiotic for a week empirically.  Postop blood loss anemia: Hgb 7.6 without significant recovery since surgery but this may be due to poor appetite and no iron replacement. Hopefully this will recover over the next few weeks with eating and po iron. No signs of ongoing blood loss.  2V CXR in am and if everything looks good she can go home.     LOS: 1 day    Gaye Pollack 03/08/2022

## 2022-03-08 NOTE — Hospital Course (Signed)
History of Present Illness:  Courtney Lopez is a 61 yo female with history of  hypertension, diabetes, renal artery stenosis felt to be secondary to fibromuscular dysplasia, bicuspid aortic valve disease, and an ascending aortic aneurysm.  She is S/P Supra Coronary Replacement of her Ascending Aorta and replacement of her AVR by Dr. Cyndia Bent on 02/14/2022.  She was initially evaluated by Dr. Cyndia Bent on 10/16 at which time she was noted to have bilateral pleural effusions and atelectasis.  She was give Lasix and potassium at that time.  At her follow up yesterday the patient admitted to being more short of breath with fever of up to 102 at times.  She denies respiratory symptoms, but continued to have pleuritic right sided chest pain.  Workup showed increase in her right sided pleural effusion.  She was referred for Thoracentesis which was scheduled for this morning.  Unfortunately on arrival the patient was hypotensive with systolics dropping into the 60s.   Overall the patient denied chest pain and palpitations at that time.  She did complain of generalized weakness and fatigue for the past week. Her thoracentesis was not performed.  It was felt she should be admitted to the hospital for more thorough workup.  Currently, the patient has some shortness of breath.  She has not taken any further BP medications today, other than her morning dose of Coreg.  She again states she has felt weak and run down for the past week.  Fever persists in the evening.    Hospital Course:  The patient had Echocardiogram which showed no evidence of cardiac tamponade.  Her Aortic Valve was functioning normally.  She underwent Right sided thoracentesis with removal of 1L of amber fluid.  She was started on empiric Vanc and Zosyn for fevers prior to admission.  She had no acute signs/symptoms of infection.  She was transitioned from IV antibiotics to oral Augmentin. Because of improved BP, she was put on Lopressor 25 mg bid. Her  creatinine was slightly increased to 1.25. She was felt stable for transfer from the ICU to 4E for further convalescence. Her creatinine decreased to 1.07. She went into a fib with RVR 10/28. She was put on an Amiodarone drip. Because of swelling and pain, at left antecubital IV, Amiodarone was stopped. She was put on oral Amiodarone. She was more hypertensive so Amlodipine was restarted. She has had anemia and was put on ferrous fumarate. Last H and H was up to 8.6 and 26.8. She has been tolerating a diet and is ambulating on room air with good oxygenation. She is felt stable for discharge today.

## 2022-03-08 NOTE — Procedures (Signed)
PROCEDURE SUMMARY:  Successful US guided right thoracentesis. Yielded 1 L of amber fluid. Patient tolerated procedure well. No immediate complications. EBL = trace  Post procedure chest X-ray reveals no pneumothorax  Alechia Lezama S Jules Baty PA-C 03/08/2022 10:28 AM

## 2022-03-08 NOTE — Progress Notes (Signed)
       [address]       [place] [address]             [phone]       EVENING ROUNDS   Readmit for pleural effusion and fever On broad spectrum antibiotics Stable

## 2022-03-08 NOTE — Discharge Instructions (Signed)

## 2022-03-09 ENCOUNTER — Inpatient Hospital Stay (HOSPITAL_COMMUNITY): Payer: 59

## 2022-03-09 LAB — BASIC METABOLIC PANEL
Anion gap: 10 (ref 5–15)
BUN: 15 mg/dL (ref 8–23)
CO2: 26 mmol/L (ref 22–32)
Calcium: 8.7 mg/dL — ABNORMAL LOW (ref 8.9–10.3)
Chloride: 100 mmol/L (ref 98–111)
Creatinine, Ser: 1.25 mg/dL — ABNORMAL HIGH (ref 0.44–1.00)
GFR, Estimated: 48 mL/min — ABNORMAL LOW (ref >60)
Glucose, Bld: 195 mg/dL — ABNORMAL HIGH (ref 70–99)
Potassium: 3.7 mmol/L (ref 3.5–5.1)
Sodium: 136 mmol/L (ref 135–145)

## 2022-03-09 LAB — GLUCOSE, CAPILLARY
Glucose-Capillary: 185 mg/dL — ABNORMAL HIGH (ref 70–99)
Glucose-Capillary: 78 mg/dL (ref 70–99)

## 2022-03-09 MED ORDER — AMIODARONE LOAD VIA INFUSION
150.0000 mg | Freq: Once | INTRAVENOUS | Status: AC
  Administered 2022-03-09: 150 mg via INTRAVENOUS
  Filled 2022-03-09: qty 83.34

## 2022-03-09 MED ORDER — AMOXICILLIN-POT CLAVULANATE 875-125 MG PO TABS
1.0000 | ORAL_TABLET | Freq: Two times a day (BID) | ORAL | Status: DC
  Administered 2022-03-09 – 2022-03-11 (×5): 1 via ORAL
  Filled 2022-03-09 (×6): qty 1

## 2022-03-09 MED ORDER — AMIODARONE HCL IN DEXTROSE 360-4.14 MG/200ML-% IV SOLN
30.0000 mg/h | INTRAVENOUS | Status: DC
  Administered 2022-03-10: 30 mg/h via INTRAVENOUS
  Filled 2022-03-09: qty 200

## 2022-03-09 MED ORDER — METOPROLOL TARTRATE 25 MG PO TABS
25.0000 mg | ORAL_TABLET | Freq: Once | ORAL | Status: AC
  Administered 2022-03-09: 25 mg via ORAL
  Filled 2022-03-09: qty 1

## 2022-03-09 MED ORDER — METOPROLOL TARTRATE 25 MG PO TABS: 25.0000 mg | ORAL_TABLET | Freq: Two times a day (BID) | ORAL | Status: DC

## 2022-03-09 MED ORDER — AMIODARONE HCL IN DEXTROSE 360-4.14 MG/200ML-% IV SOLN
60.0000 mg/h | INTRAVENOUS | Status: DC
  Administered 2022-03-09 – 2022-03-10 (×2): 60 mg/h via INTRAVENOUS
  Filled 2022-03-09 (×2): qty 200

## 2022-03-09 MED ORDER — POTASSIUM CHLORIDE CRYS ER 20 MEQ PO TBCR
20.0000 meq | EXTENDED_RELEASE_TABLET | Freq: Two times a day (BID) | ORAL | Status: AC
  Administered 2022-03-09 (×2): 20 meq via ORAL
  Filled 2022-03-09 (×2): qty 1

## 2022-03-09 MED ORDER — METOPROLOL TARTRATE 25 MG PO TABS
25.0000 mg | ORAL_TABLET | Freq: Two times a day (BID) | ORAL | Status: DC
  Administered 2022-03-10 – 2022-03-11 (×3): 25 mg via ORAL
  Filled 2022-03-09 (×3): qty 1

## 2022-03-09 NOTE — Progress Notes (Signed)
[address]       [place] [address]             [phone]            Total Length of Stay:  LOS: 2 days  Readmit for pleural effusion/Fever  SUBJECTIVE: Breathing better Overall feels better but still weak Nothing tastes good to her Vitals:   03/09/22 0400 03/09/22 0500  BP: 136/80 104/77  Pulse: 98 89  Resp: 15 19  Temp:    SpO2: 95% 93%    Intake/Output      10/27 0701 10/28 0700 10/28 0701 10/29 0700   P.O. 965    IV Piggyback 159.1    Total Intake(mL/kg) 1124.1 (15.4)    Net +1124.1         Urine Occurrence 6 x        piperacillin-tazobactam (ZOSYN)  IV 3.375 g (03/09/22 0345)    CBC    Component Value Date/Time   WBC 8.0 03/08/2022 0416   RBC 2.84 (L) 03/08/2022 0416   HGB 7.6 (L) 03/08/2022 0416   HGB 12.8 03/23/2021 0929   HCT 24.3 (L) 03/08/2022 0416   HCT 38.5 03/23/2021 0929   PLT 497 (H) 03/08/2022 0416   PLT 319 03/23/2021 0929   MCV 85.6 03/08/2022 0416   MCV 82 03/23/2021 0929   MCV 84 02/17/2013 0746   MCH 26.8 03/08/2022 0416   MCHC 31.3 03/08/2022 0416   RDW 14.5 03/08/2022 0416   RDW 12.7 03/23/2021 0929   RDW 12.9 02/17/2013 0746   LYMPHSABS 1,344 03/07/2022 1031   LYMPHSABS 2.3 03/23/2021 0929   LYMPHSABS 2.0 02/17/2013 0746   MONOABS 1.0 04/05/2021 0545   MONOABS 0.7 02/17/2013 0746   EOSABS 305 03/07/2022 1031   EOSABS 0.2 03/23/2021 0929   EOSABS 0.1 02/17/2013 0746   BASOSABS 53 03/07/2022 1031   BASOSABS 0.1 03/23/2021 0929   BASOSABS 0.0 02/17/2013 0746   CMP     Component Value Date/Time   NA 136 03/08/2022 0416   NA 139 09/21/2021 1418   NA 136 02/17/2013 0746   K 3.3 (L) 03/08/2022 0416   K 3.6 02/17/2013 0746   CL 97 (L) 03/08/2022 0416   CL 100 02/17/2013 0746   CO2 26 03/08/2022 0416   CO2 29 02/17/2013 0746   GLUCOSE 136 (H) 03/08/2022 0416   GLUCOSE 147 (H) 02/17/2013 0746   BUN 24 (H) 03/08/2022 0416   BUN 20 09/21/2021 1418   BUN 23 (H) 02/17/2013 0746    CREATININE 1.52 (H) 03/08/2022 0416   CREATININE 1.31 (H) 03/07/2022 1031   CALCIUM 8.8 (L) 03/08/2022 0416   CALCIUM 9.2 02/17/2013 0746   PROT 5.9 (L) 03/07/2022 1640   PROT 7.0 09/21/2021 1418   PROT 7.7 02/17/2013 0746   ALBUMIN 2.6 (L) 03/07/2022 1640   ALBUMIN 4.6 09/21/2021 1418   ALBUMIN 4.1 02/17/2013 0746   AST 70 (H) 03/07/2022 1640   AST 28 02/17/2013 0746   ALT 51 (H) 03/07/2022 1640   ALT 29 02/17/2013 0746   ALKPHOS 84 03/07/2022 1640   ALKPHOS 78 02/17/2013 0746   BILITOT 0.9 03/07/2022 1640   BILITOT 0.6 09/21/2021 1418   BILITOT 0.5 02/17/2013 0746   GFRNONAA 38 (L) 03/08/2022 0416   GFRNONAA >60 02/17/2013 0746   GFRAA >60 10/21/2018 0601   GFRAA >60 02/17/2013 0746   ABG    Component Value Date/Time   PHART 7.386 02/14/2022 2315  PCO2ART 33.7 02/14/2022 2315   PO2ART 92 02/14/2022 2315   HCO3 20.2 02/14/2022 2315   TCO2 21 (L) 02/14/2022 2315   ACIDBASEDEF 4.0 (H) 02/14/2022 2315   O2SAT 61.4 02/15/2022 0438   CBG (last 3)  Recent Labs    03/08/22 0631 03/08/22 0824 03/08/22 1158  GLUCAP 128* 130* 136*   EXAM Lungs: very decreased R base Card: RR Wound: clean Ext: no edema  ASSESSMENT: Readmit Day #3 for pleural effusion Respiratory status improved, CXR with some atelectasis/effusion on R base with elevated diaphragm. Need to hold off on diuretic for now secondary to elevated Cr. Awaiting repeat from today Will change to po antibiotic for 5 days Transfer to floor Ambulate   Eugenio Hoes, MD @DATE @

## 2022-03-09 NOTE — Progress Notes (Signed)
Pt arrived from ...2h.., A/ox ..4.pt denies any pain, MD aware,CCMD called. CHG bath given,no further needs at this time   

## 2022-03-09 NOTE — Plan of Care (Signed)
  Problem: Education: Goal: Will demonstrate proper wound care and an understanding of methods to prevent future damage Outcome: Progressing Goal: Knowledge of disease or condition will improve Outcome: Progressing Goal: Knowledge of the prescribed therapeutic regimen will improve Outcome: Progressing Goal: Individualized Educational Video(s) Outcome: Progressing   Problem: Activity: Goal: Risk for activity intolerance will decrease Outcome: Progressing   Problem: Cardiac: Goal: Will achieve and/or maintain hemodynamic stability Outcome: Progressing   

## 2022-03-10 ENCOUNTER — Encounter (HOSPITAL_COMMUNITY): Payer: Self-pay | Admitting: Surgery

## 2022-03-10 ENCOUNTER — Other Ambulatory Visit: Payer: Self-pay

## 2022-03-10 LAB — GLUCOSE, CAPILLARY
Glucose-Capillary: 117 mg/dL — ABNORMAL HIGH (ref 70–99)
Glucose-Capillary: 176 mg/dL — ABNORMAL HIGH (ref 70–99)
Glucose-Capillary: 142 mg/dL — ABNORMAL HIGH (ref 70–99)
Glucose-Capillary: 128 mg/dL — ABNORMAL HIGH (ref 70–99)

## 2022-03-10 LAB — CBC
HCT: 26.8 % — ABNORMAL LOW (ref 36.0–46.0)
Hemoglobin: 8.6 g/dL — ABNORMAL LOW (ref 12.0–15.0)
MCH: 27.3 pg (ref 26.0–34.0)
MCHC: 32.1 g/dL (ref 30.0–36.0)
MCV: 85.1 fL (ref 80.0–100.0)
Platelets: 531 10*3/uL — ABNORMAL HIGH (ref 150–400)
RBC: 3.15 MIL/uL — ABNORMAL LOW (ref 3.87–5.11)
RDW: 14.6 % (ref 11.5–15.5)
WBC: 9.7 10*3/uL (ref 4.0–10.5)
nRBC: 0 % (ref 0.0–0.2)

## 2022-03-10 LAB — BASIC METABOLIC PANEL
Anion gap: 10 (ref 5–15)
BUN: 11 mg/dL (ref 8–23)
CO2: 26 mmol/L (ref 22–32)
Calcium: 9 mg/dL (ref 8.9–10.3)
Chloride: 104 mmol/L (ref 98–111)
Creatinine, Ser: 1.07 mg/dL — ABNORMAL HIGH (ref 0.44–1.00)
GFR, Estimated: 58 mL/min — ABNORMAL LOW (ref >60)
Glucose, Bld: 118 mg/dL — ABNORMAL HIGH (ref 70–99)
Potassium: 4.7 mmol/L (ref 3.5–5.1)
Sodium: 140 mmol/L (ref 135–145)

## 2022-03-10 MED ORDER — AMIODARONE HCL 200 MG PO TABS
400.0000 mg | ORAL_TABLET | Freq: Two times a day (BID) | ORAL | Status: DC
  Administered 2022-03-10 – 2022-03-11 (×3): 400 mg via ORAL
  Filled 2022-03-10 (×3): qty 2

## 2022-03-10 MED ORDER — AMLODIPINE BESYLATE 5 MG PO TABS
5.0000 mg | ORAL_TABLET | Freq: Every day | ORAL | Status: DC
  Administered 2022-03-10 – 2022-03-11 (×2): 5 mg via ORAL
  Filled 2022-03-10 (×2): qty 1

## 2022-03-10 NOTE — Progress Notes (Addendum)
Onset of afib RVR. EKG done; Dr. Lavonna Monarch notified.  Patient denies SOB/CP; B/P elevated  Orders for metoprolol 25 mg once. Then Metoprolol BID.  Amio gtt started per order set. Bolus given.   B/ps cycled.

## 2022-03-10 NOTE — Progress Notes (Signed)
Converted to SR

## 2022-03-10 NOTE — Progress Notes (Addendum)
      [address]       [place] [address]             [phone]            Subjective: Patient with cough and able to expectorate.  Objective: Vital signs in last 24 hours: Temp:  [98.4 F (36.9 C)-99.3 F (37.4 C)] 98.4 F (36.9 C) (10/29 0746) Pulse Rate:  [80-135] 84 (10/29 0746) Cardiac Rhythm: Atrial fibrillation (10/28 2200) Resp:  [17-25] 17 (10/29 0746) BP: (109-166)/(69-101) 166/91 (10/29 0746) SpO2:  [94 %-100 %] 98 % (10/29 0746)  Current Weight  03/07/22 72.9 kg      Intake/Output from previous day: 10/28 0701 - 10/29 0700 In: 591.4 [P.O.:240; I.V.:325.8; IV Piggyback:25.6] Out: -    Physical Exam:  Cardiovascular: RRR Pulmonary: Clear to auscultation bilaterally Abdomen: Soft, non tender, bowel sounds present. Extremities: Trace  bilateral lower extremity edema. Some selling but no erythema left antecubital IV Wounds: Clean and dry.  No erythema or signs of infection.  Lab Results: CBC: Recent Labs    03/08/22 0416 03/10/22 0105  WBC 8.0 9.7  HGB 7.6* 8.6*  HCT 24.3* 26.8*  PLT 497* 531*   BMET:  Recent Labs    03/09/22 0811 03/10/22 0105  NA 136 140  K 3.7 4.7  CL 100 104  CO2 26 26  GLUCOSE 195* 118*  BUN 15 11  CREATININE 1.25* 1.07*  CALCIUM 8.7* 9.0    PT/INR:  Lab Results  Component Value Date   INR 1.5 (H) 02/14/2022   INR 1.0 02/12/2022   INR 1.0 10/21/2018   ABG:  INR: Will add last result for INR, ABG once components are confirmed Will add last 4 CBG results once components are confirmed  Assessment/Plan:  1. CV - A fib with RVR last evening. On Amiodarone drip;stopped as swelling/pain at left antecubital IV site. On Lopressor 25 mg bid. SBP increasing so will restart Amlodipine as creatinine just normalized. Start oral Amiodarone 400 mg bid 2.  Pulmonary - S/p right thoracentesis 10/27 with 1 liter of fluid removed. Check PA/LAT CXR in am. On room air.  3. ID-on Augmentin empirically.   4.  Anemia-H and H stable at 8.6 and 26.8. Continue ferrous fumarate. 5. Creatinine decreased from 1.25 to 1.07 6. DM-CBGs 185/78/117. On Insulin PRN. Will restart Metformin at discharge.  7. Hope to discharge in am   Donielle M ZimmermanPA-C 8:12 AM   I have reviewed above and agree with plan

## 2022-03-10 NOTE — Plan of Care (Signed)
  Problem: Skin Integrity: Goal: Wound healing without signs and symptoms of infection Outcome: Progressing Goal: Risk for impaired skin integrity will decrease Outcome: Progressing   Problem: Activity: Goal: Risk for activity intolerance will decrease Outcome: Adequate for Discharge   Problem: Clinical Measurements: Goal: Postoperative complications will be avoided or minimized Outcome: Adequate for Discharge

## 2022-03-10 NOTE — Plan of Care (Signed)
  Problem: Activity: Goal: Risk for activity intolerance will decrease Outcome: Progressing   

## 2022-03-10 NOTE — Progress Notes (Signed)
Secure chat with Elenore Paddy re PIV request.  No IV meds currently ordered at this time.  Charles RN states ok to leave IV out at this time.

## 2022-03-11 ENCOUNTER — Inpatient Hospital Stay (HOSPITAL_COMMUNITY): Payer: 59

## 2022-03-11 ENCOUNTER — Other Ambulatory Visit (HOSPITAL_COMMUNITY): Payer: Self-pay

## 2022-03-11 DIAGNOSIS — Z8616 Personal history of COVID-19: Secondary | ICD-10-CM | POA: Diagnosis not present

## 2022-03-11 DIAGNOSIS — I959 Hypotension, unspecified: Secondary | ICD-10-CM | POA: Diagnosis not present

## 2022-03-11 DIAGNOSIS — E1121 Type 2 diabetes mellitus with diabetic nephropathy: Secondary | ICD-10-CM | POA: Diagnosis not present

## 2022-03-11 DIAGNOSIS — J9811 Atelectasis: Secondary | ICD-10-CM | POA: Diagnosis not present

## 2022-03-11 DIAGNOSIS — D62 Acute posthemorrhagic anemia: Secondary | ICD-10-CM | POA: Diagnosis not present

## 2022-03-11 DIAGNOSIS — I10 Essential (primary) hypertension: Secondary | ICD-10-CM | POA: Diagnosis not present

## 2022-03-11 DIAGNOSIS — J90 Pleural effusion, not elsewhere classified: Secondary | ICD-10-CM | POA: Diagnosis not present

## 2022-03-11 DIAGNOSIS — D75839 Thrombocytosis, unspecified: Secondary | ICD-10-CM | POA: Diagnosis not present

## 2022-03-11 DIAGNOSIS — R627 Adult failure to thrive: Secondary | ICD-10-CM | POA: Diagnosis not present

## 2022-03-11 DIAGNOSIS — I701 Atherosclerosis of renal artery: Secondary | ICD-10-CM | POA: Diagnosis not present

## 2022-03-11 LAB — CBC
HCT: 28.4 % — ABNORMAL LOW (ref 36.0–46.0)
Hemoglobin: 8.9 g/dL — ABNORMAL LOW (ref 12.0–15.0)
MCH: 26.6 pg (ref 26.0–34.0)
MCHC: 31.3 g/dL (ref 30.0–36.0)
MCV: 85 fL (ref 80.0–100.0)
Platelets: 570 10*3/uL — ABNORMAL HIGH (ref 150–400)
RBC: 3.34 MIL/uL — ABNORMAL LOW (ref 3.87–5.11)
RDW: 14.7 % (ref 11.5–15.5)
WBC: 10.4 10*3/uL (ref 4.0–10.5)
nRBC: 0 % (ref 0.0–0.2)

## 2022-03-11 LAB — BASIC METABOLIC PANEL
Anion gap: 10 (ref 5–15)
BUN: 7 mg/dL — ABNORMAL LOW (ref 8–23)
CO2: 26 mmol/L (ref 22–32)
Calcium: 9.1 mg/dL (ref 8.9–10.3)
Chloride: 103 mmol/L (ref 98–111)
Creatinine, Ser: 1 mg/dL (ref 0.44–1.00)
GFR, Estimated: >60 mL/min (ref >60)
Glucose, Bld: 117 mg/dL — ABNORMAL HIGH (ref 70–99)
Potassium: 4.7 mmol/L (ref 3.5–5.1)
Sodium: 139 mmol/L (ref 135–145)

## 2022-03-11 LAB — GLUCOSE, CAPILLARY
Glucose-Capillary: 156 mg/dL — ABNORMAL HIGH (ref 70–99)
Glucose-Capillary: 97 mg/dL (ref 70–99)
Glucose-Capillary: 112 mg/dL — ABNORMAL HIGH (ref 70–99)
Glucose-Capillary: 105 mg/dL — ABNORMAL HIGH (ref 70–99)
Glucose-Capillary: 129 mg/dL — ABNORMAL HIGH (ref 70–99)

## 2022-03-11 MED ORDER — AMLODIPINE BESYLATE 5 MG PO TABS: 5.0000 mg | ORAL_TABLET | Freq: Every day | ORAL | 1 refills | Status: DC

## 2022-03-11 MED ORDER — AMIODARONE HCL 200 MG PO TABS
400.0000 mg | ORAL_TABLET | Freq: Two times a day (BID) | ORAL | 1 refills | Status: DC
  Filled 2022-03-11: qty 120, 30d supply, fill #0

## 2022-03-11 MED ORDER — AMIODARONE HCL 200 MG PO TABS
ORAL_TABLET | ORAL | 0 refills | Status: DC
  Filled 2022-03-11: qty 70, 30d supply, fill #0

## 2022-03-11 MED ORDER — METOPROLOL TARTRATE 25 MG PO TABS
25.0000 mg | ORAL_TABLET | Freq: Two times a day (BID) | ORAL | 1 refills | Status: DC
  Filled 2022-03-11 – 2022-04-11 (×2): qty 60, 30d supply, fill #0

## 2022-03-11 NOTE — Progress Notes (Signed)
      [address]       [place] [address]             [phone]         Subjective: Feels pretty well, not short of breath, rhythm is stable  Objective: Vital signs in last 24 hours: Temp:  [98.4 F (36.9 C)-99.2 F (37.3 C)] 98.7 F (37.1 C) (10/30 0634) Pulse Rate:  [82-91] 86 (10/30 0634) Cardiac Rhythm: Normal sinus rhythm;Bundle branch block (10/29 1900) Resp:  [14-20] 18 (10/30 0634) BP: (125-166)/(78-91) 144/90 (10/30 0634) SpO2:  [95 %-99 %] 99 % (10/30 0634)  Hemodynamic parameters for last 24 hours:    Intake/Output from previous day: 10/29 0701 - 10/30 0700 In: 32 [I.V.:32] Out: -  Intake/Output this shift: No intake/output data recorded.  General appearance: alert, cooperative, and no distress Heart: regular rate and rhythm Lungs: Diminished in the right base Abdomen: Benign exam Extremities: No edema Wound: Incisions healing well  Lab Results: Recent Labs    03/10/22 0105 03/11/22 0103  WBC 9.7 10.4  HGB 8.6* 8.9*  HCT 26.8* 28.4*  PLT 531* 570*   BMET:  Recent Labs    03/10/22 0105 03/11/22 0103  NA 140 139  K 4.7 4.7  CL 104 103  CO2 26 26  GLUCOSE 118* 117*  BUN 11 7*  CREATININE 1.07* 1.00  CALCIUM 9.0 9.1    PT/INR: No results for input(s): "LABPROT", "INR" in the last 72 hours. ABG    Component Value Date/Time   PHART 7.386 02/14/2022 2315   HCO3 20.2 02/14/2022 2315   TCO2 21 (L) 02/14/2022 2315   ACIDBASEDEF 4.0 (H) 02/14/2022 2315   O2SAT 61.4 02/15/2022 0438   CBG (last 3)  Recent Labs    03/10/22 1546 03/10/22 2027 03/11/22 0552  GLUCAP 142* 128* 129*    Meds Scheduled Meds:  amiodarone  400 mg Oral BID   amLODipine  5 mg Oral Daily   amoxicillin-clavulanate  1 tablet Oral BID   aspirin EC  81 mg Oral Daily   enoxaparin (LOVENOX) injection  30 mg Subcutaneous Q24H   Ferrous Fumarate  1 tablet Oral Daily   And   folic acid-pyridoxine-cyancobalamin  1 tablet Oral Daily   fluticasone   2 spray Each Nare Daily   insulin aspart  0-9 Units Subcutaneous TID WC   metoprolol tartrate  25 mg Oral BID   rosuvastatin  5 mg Oral QHS   Continuous Infusions: PRN Meds:.acetaminophen, ALPRAZolam, chlorpheniramine-HYDROcodone, docusate sodium, ondansetron (ZOFRAN) IV, mouth rinse, traMADol  Xrays No results found.  Assessment/Plan: 1 afebrile, vital signs stable, SBP 120s to 160s, sinus rhythm 2 O2 sats good on room air 3 urine output not recorded 4 chest x-ray-still has some fluid on the right side but fairly stable in appearance 5 normal renal function 6 no leukocytosis, H&H with improving trend, platelet count is rising but she has had a persistent thrombocytosis 7  blood sugars pretty well controlled 8 stable for d/c    LOS: 4 days    John Giovanni PA-C Pager [phone] 03/11/2022

## 2022-03-11 NOTE — TOC Transition Note (Signed)
Transition of Care (TOC) - CM/SW Discharge Note Marvetta Gibbons RN, BSN Transitions of Care Unit 4E- RN Case Manager See Treatment Team for direct phone #    Patient Details  Name: Courtney Lopez MRN: [id number] Date of Birth: January 03, 1961  [facility] CM/SW Contact:  Dawayne Patricia, RN Phone Number: 03/11/2022, 10:18 AM   Clinical Narrative:    Pt stable for transition home today, Transition of Care Department [facility]) has reviewed patient and no TOC needs have been identified at this time. Family to transport home.    Final next level of care: Home/Self Care Barriers to Discharge: No Barriers Identified   Patient Goals and CMS Choice     Choice offered to / list presented to : NA  Discharge Placement               Home        Discharge Plan and Services In-house Referral: NA Discharge Planning Services: NA Post Acute Care Choice: NA          DME Arranged: N/A DME Agency: NA       HH Arranged: NA HH Agency: NA        Social Determinants of Health (SDOH) Interventions     Readmission Risk Interventions    03/11/2022   10:18 AM  Readmission Risk Prevention Plan  Post Dischage Appt Complete  Medication Screening Complete  Transportation Screening Complete

## 2022-03-11 NOTE — Progress Notes (Signed)
Mobility Specialist Progress Note:   03/11/22 1011  Mobility  Activity Ambulated with assistance in hallway  Level of Assistance Independent  Assistive Device None  Distance Ambulated (ft) 400 ft  Activity Response Tolerated well  $Mobility charge 1 Mobility   Pt received in bed willing to participate in mobility. No complaints of pain. Left in bed with call bell in reach and all needs met.   [facility] Surveyor, mining Chat only

## 2022-03-12 ENCOUNTER — Telehealth: Payer: Self-pay

## 2022-03-12 LAB — CULTURE, BLOOD (ROUTINE X 2)
Culture: NO GROWTH
Culture: NO GROWTH
Special Requests: ADEQUATE
Special Requests: ADEQUATE

## 2022-03-12 NOTE — Telephone Encounter (Signed)
Transition Care Management Unsuccessful Follow-up Telephone Call  Date of discharge and from where:  [place] 03-11-22 Dx: failure to thrive in adult   Attempts:  1st Attempt  Reason for unsuccessful TCM follow-up call:  Left voice message   Juanda Crumble LPN [place] Direct Dial [phone]   Transition Care Management Unsuccessful Follow-up Telephone Call  Date of discharge and from where:  [place] 03-11-22 Dx: failure to thrive in adult   Attempts:  2nd Attempt  Reason for unsuccessful TCM follow-up call:  Left voice message   Juanda Crumble LPN [place] Direct Dial [phone]   Transition Care Management Follow-up Telephone Call Date of discharge and from where:   [place] 03-11-22 Dx: failure to thrive in adult   How have you been since you were released from the hospital? Doing ok  Any questions or concerns? No  Items Reviewed: Did the pt receive and understand the discharge instructions provided? Yes  Medications obtained and verified? Yes  Other? No  Any new allergies since your discharge? No  Dietary orders reviewed? Yes Do you have support at home? Yes   Home Care and Equipment/Supplies: Were home health services ordered? no If so, what is the name of the agency? na  Has the agency set up a time to come to the patient's home? not applicable Were any new equipment or medical supplies ordered?  No What is the name of the medical supply agency? na Were you able to get the supplies/equipment? not applicable Do you have any questions related to the use of the equipment or supplies? No  Functional Questionnaire: (I = Independent and D = Dependent) ADLs: I  Bathing/Dressing- I  Meal Prep- I  Eating- I  Maintaining continence- I  Transferring/Ambulation- I  Managing Meds- I  Follow up appointments reviewed:  PCP Hospital f/u appt confirmed? Yes  Scheduled to see Dr Derrel Nip on  03-25-22 @ 130pm. [facility] f/u appt confirmed? Yes  Scheduled to see Dr Cyndia Bent on 11-2- @ 1130am. Are transportation arrangements needed? No  If their condition worsens, is the pt aware to call PCP or go to the Emergency Dept.? Yes Was the patient provided with contact information for the PCP's office or ED? Yes Was to pt encouraged to call back with questions or concerns? Yes   Juanda Crumble LPN [place] Direct Dial [phone]

## 2022-03-13 ENCOUNTER — Other Ambulatory Visit: Payer: Self-pay | Admitting: Surgery

## 2022-03-13 ENCOUNTER — Ambulatory Visit: Payer: 59 | Admitting: Surgery

## 2022-03-13 DIAGNOSIS — Z952 Presence of prosthetic heart valve: Secondary | ICD-10-CM

## 2022-03-14 ENCOUNTER — Encounter: Payer: Self-pay | Admitting: Surgery

## 2022-03-14 ENCOUNTER — Ambulatory Visit (INDEPENDENT_AMBULATORY_CARE_PROVIDER_SITE_OTHER): Payer: Self-pay | Admitting: Surgery

## 2022-03-14 ENCOUNTER — Ambulatory Visit
Admission: RE | Admit: 2022-03-14 | Discharge: 2022-03-14 | Disposition: A | Discharge status: Home / Self Care | Payer: 59 | Source: Ambulatory Visit | Attending: Surgery | Admitting: Surgery

## 2022-03-14 VITALS — BP 121/77 | HR 74 | Resp 20 | Ht 66.0 in | Wt 159.1 lb

## 2022-03-14 DIAGNOSIS — J90 Pleural effusion, not elsewhere classified: Secondary | ICD-10-CM | POA: Diagnosis not present

## 2022-03-14 DIAGNOSIS — J9811 Atelectasis: Secondary | ICD-10-CM | POA: Diagnosis not present

## 2022-03-14 DIAGNOSIS — Z952 Presence of prosthetic heart valve: Secondary | ICD-10-CM

## 2022-03-14 NOTE — Progress Notes (Signed)
HPI:  The patient returns today for follow-up status post supra-coronary replacement of the ascending aorta under deep hypothermic circulatory arrest and aortic valve replacement using a 23 mm Edwards Inspiris pericardial valve on 02/14/2022.  She had stage D3, paradoxical low gradient/low flow severe bicuspid aortic stenosis with NYHA class III symptoms of exertional fatigue and shortness of breath as well as episodic dizziness.  This was associated with a stable 4.8 cm fusiform ascending aortic aneurysm.  Her initial postop course was uncomplicated.  She returned with generalized weakness and shortness of breath and was noted to have a moderate to large size right pleural effusion.  She was scheduled for right thoracentesis as an outpatient but when she presented to radiology she was noted to be hypotensive and orthostatic and the procedure was canceled.  She was admitted on 02/14/2022 for further work-up.  A 2D echocardiogram showed normal left ventricular systolic function.  The aortic valve is functioning normally with no evidence of paravalvular leak.  There is a small pericardial effusion over the right ventricle.  She subsequently underwent a right thoracentesis on 03/08/2022 while hospitalized with removal of 1 L of amber serosanguineous fluid.  She still had some right pleural effusion following that thoracentesis but it was significantly less and her right lower lobe aeration improved.  She was treated with a course of Zosyn and vancomycin since she reported some fever to 102 at home.  She remained afebrile in the hospital with a normal white blood cell count.  Blood cultures were negative and antibiotics were changed to p.o. Augmentin for 1 week.  She also developed some atrial fibrillation and was started on intravenous amiodarone with conversion.  She was sent home on oral amiodarone 200 mg daily.  She is here today with her son.  She reports that she is still somewhat tired and weak with some  exertional shortness of breath but significantly better than she was before.  She denies any dizziness.  Current Outpatient Medications  Medication Sig Dispense Refill   acetaminophen (TYLENOL) 325 MG tablet Take 2 tablets (650 mg total) by mouth every 6 (six) hours as needed for mild pain.     ALPRAZolam (XANAX) 0.25 MG tablet Take 1 tablet (0.25 mg total) by mouth 2 (two) times daily as needed. 60 tablet 5   amiodarone (PACERONE) 200 MG tablet 10/30 -11/4:  Take 474m (2 tablets) by mouth two times daily  11/5 and on:  Take 4035m(2 tablets) by mouth once daily 70 tablet 0   amLODipine (NORVASC) 5 MG tablet Take 1 tablet (5 mg total) by mouth daily. 30 tablet 1   amoxicillin (AMOXIL) 500 MG capsule Take 2,000 mg by mouth See admin instructions. Take 2,000 mg by mouth one hour prior to dental procedures     amoxicillin-clavulanate (AUGMENTIN) 875-125 MG tablet Take 1 tablet by mouth every 12 (twelve) hours. 14 tablet 0   aspirin EC 81 MG tablet Take 1 tablet (81 mg total) by mouth daily. Swallow whole. 30 tablet 12   blood glucose meter kit and supplies KIT Dispense based on patient and insurance preference. Use up to four times daily as directed. 1 each 0   estradiol (ESTRACE) 0.1 MG/GM vaginal cream INSERT 1/4 APPLICATORFUL VAGINALLY TWO TIMES A WEEK AS DIRECTED (Patient taking differently: Place 0.0.86pplicatorfuls vaginally 2 (two) times a week.) 42.5 g 5   fluticasone (FLONASE) 50 MCG/ACT nasal spray PLACE 2 SPRAYS INTO BOTH NOSTRILS DAILY. 16 g 2   glucose  blood test strip Use up to 4 times a day as directed 100 each 0   Menthol, Topical Analgesic, (BIOFREEZE EX) Apply 1 application  topically 3 (three) times daily as needed (neck pain).     metFORMIN (GLUCOPHAGE-XR) 750 MG 24 hr tablet Take 1 tablet (750 mg total) by mouth daily with breakfast. (Patient taking differently: Take 375-750 mg by mouth See admin instructions. Take 750 mg by mouth in the morning with breakfast as needed for  elevated BGL and may decrease dose to 375 mg if BGL is low) 90 tablet 1   metoprolol tartrate (LOPRESSOR) 25 MG tablet Take 1 tablet (25 mg total) by mouth 2 (two) times daily. 60 tablet 1   rosuvastatin (CRESTOR) 5 MG tablet Take 1 tablet (5 mg total) by mouth at bedtime. 90 tablet 2   sodium fluoride (SF 5000 PLUS) 1.1 % CREA dental cream Brush teeth thoroughly for 2 minutes, do not rinse afterwards. 51 g 12   traMADol (ULTRAM) 50 MG tablet TAKE 2 TABLETS (100 MG TOTAL) BY MOUTH EVERY 6 (SIX) HOURS AS NEEDED. (Patient taking differently: Take 100 mg by mouth every 6 (six) hours as needed (for pain).) 240 tablet 5   No current facility-administered medications for this visit.     Physical Exam: BP 121/77 (BP Location: Left Arm, Patient Position: Sitting, Cuff Size: Normal)   Pulse 74   Resp 20   Ht _0  (1.676 m)   Wt 159 lb 1.6 oz (72.2 kg)   LMP  (LMP Unknown)   SpO2 95% Comment: RA  BMI 25.68 kg/m  She looks well. Cardiac exam shows a regular rate and rhythm with normal heart sounds.  There is no murmur. Lung exam reveals decreased breath sounds at the right base. The chest incision is healing well and the sternum is stable. There is no peripheral edema.  Diagnostic Tests:  Narrative & Impression  CLINICAL DATA:  Cough for couple weeks, shortness of breath, weakness, post AVR 02/14/2022   EXAM: CHEST - 2 VIEW   COMPARISON:  03/11/2022   FINDINGS: Enlargement of cardiac silhouette post AVR.   Mildly tortuous thoracic aorta.   Mediastinal contours and pulmonary vascularity normal.   Persistent RIGHT basilar effusion and atelectasis.   Trace LEFT pleural effusion.   Remaining lungs clear.   No new infiltrate or pneumothorax.   Osseous structures unremarkable.   IMPRESSION: Enlargement of cardiac silhouette post AVR.   Persistent RIGHT pleural effusion and basilar atelectasis as well as trace LEFT pleural effusion.     Electronically Signed   By: Lavonia Dana M.D.   On: 03/14/2022 11:26      Impression:  Overall I think she is making progress since discharge.  She still has a small right pleural effusion with right lower lobe atelectasis and hopefully this will continue to resolve with time.  She has no peripheral edema and I am hesitant to put her back on a diuretic since I think she got dehydrated before.  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  I encouraged her to continue using her incentive spirometer.  I told her she could return to driving a car when she feels comfortable with that but should refrain lifting anything heavier than 10 pounds for 3 months postoperatively.  Plan:  I will see her back in the office in 2 to 3 weeks with a chest x-ray.   Gaye Pollack, MD [facility] [phone]

## 2022-03-22 ENCOUNTER — Other Ambulatory Visit: Payer: Self-pay

## 2022-03-22 MED FILL — Tramadol HCl Tab 50 MG: ORAL | 30 days supply | Qty: 240 | Fill #2 | Status: AC

## 2022-03-25 ENCOUNTER — Ambulatory Visit: Payer: 59 | Admitting: Internal Medicine

## 2022-04-01 ENCOUNTER — Other Ambulatory Visit: Payer: Self-pay | Admitting: Internal Medicine

## 2022-04-01 ENCOUNTER — Other Ambulatory Visit: Payer: Self-pay

## 2022-04-01 MED ORDER — ALPRAZOLAM 0.25 MG PO TABS
0.2500 mg | ORAL_TABLET | Freq: Two times a day (BID) | ORAL | 0 refills | Status: DC | PRN
  Filled 2022-04-01: qty 60, 30d supply, fill #0

## 2022-04-04 IMAGING — US US EXTREM LOW DUPLEX ARTERIAL*R* LIMITED
1 series · 14 of 20 positions shown · non-contrast
Comparison: None.

CLINICAL DATA: Right groin swelling and bruising after angiogram
last week.

EXAM:
RIGHT LOWER EXTREMITY ARTERIAL DUPLEX SCAN
TECHNIQUE: Gray-scale sonography as well as color Doppler and duplex ultrasound
was performed to evaluate the right lower extremity common femoral
artery and vein.

[Series 1: us lower ext art right ltd · 14 of 20 slices shown]
[im 1/20]
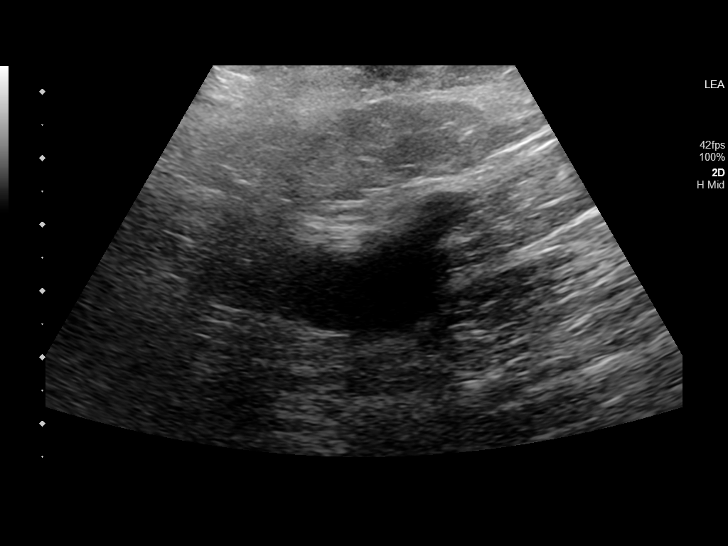
[im 3/20]
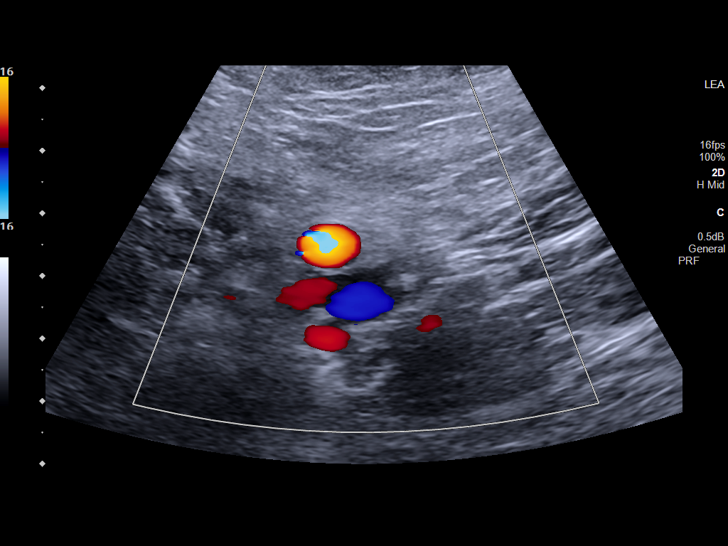
[im 4/20]
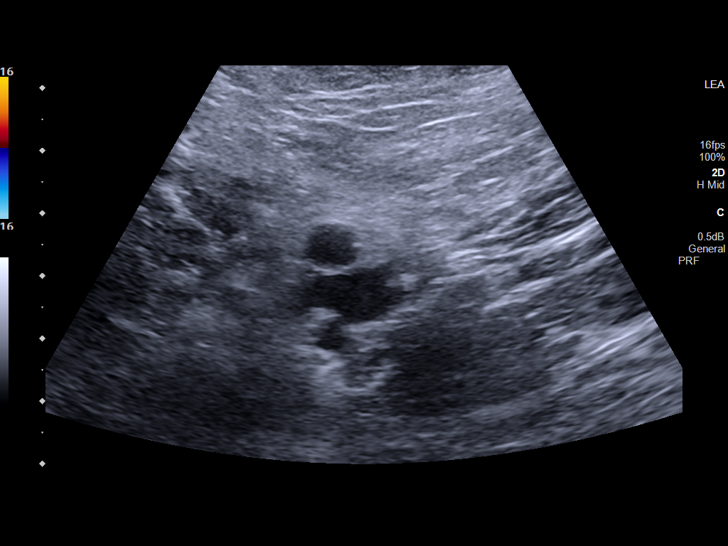
[im 6/20]
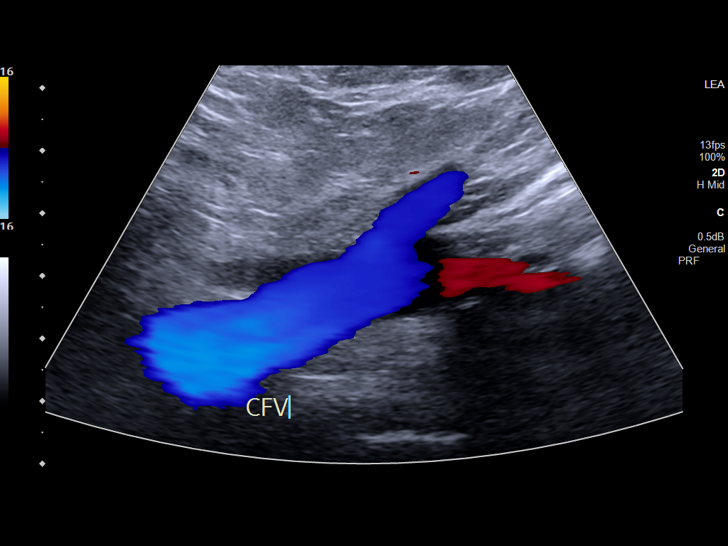
[im 7/20]
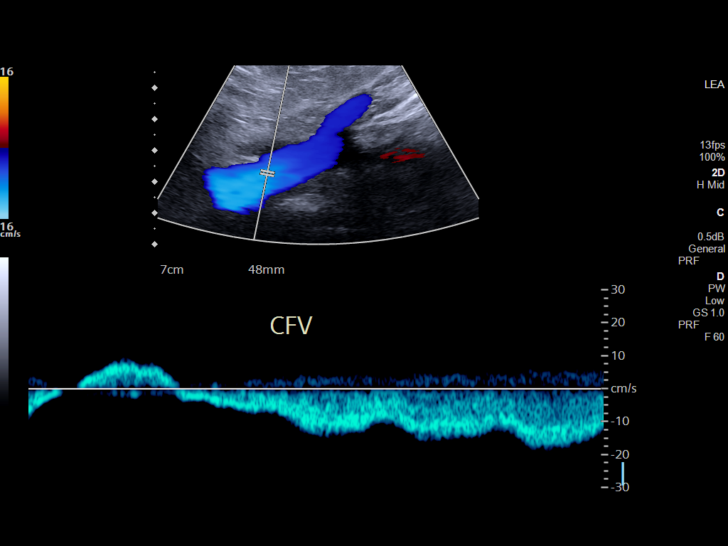
[im 8/20]
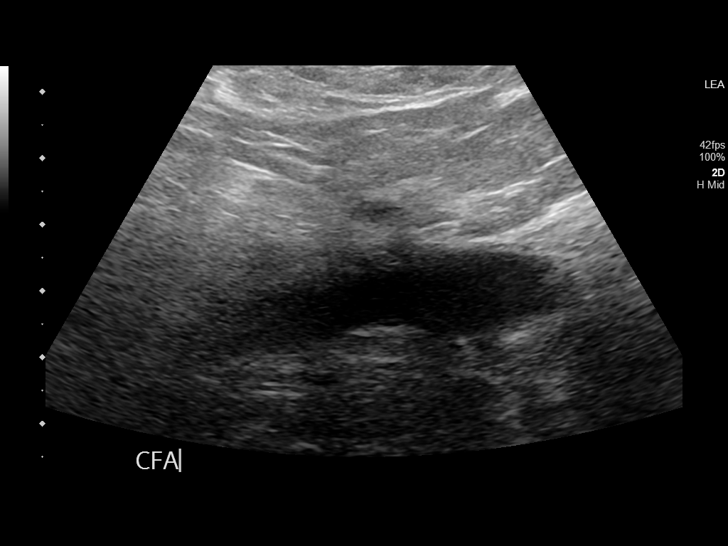
[im 10/20]
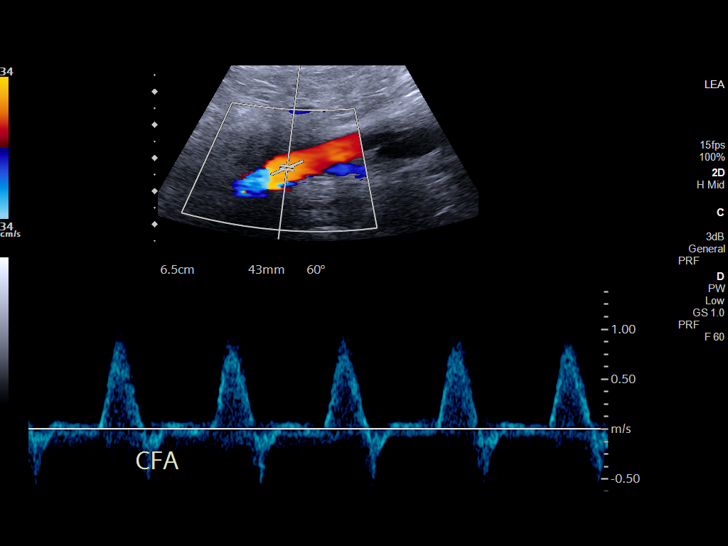
[im 11/20]
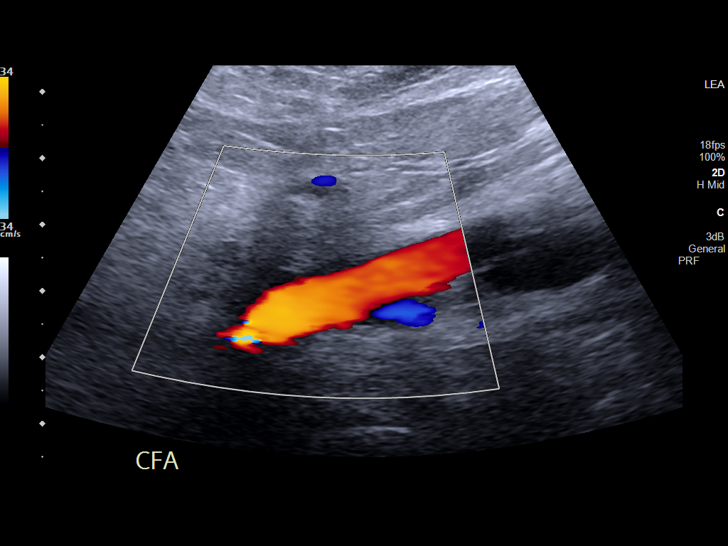
[im 13/20]
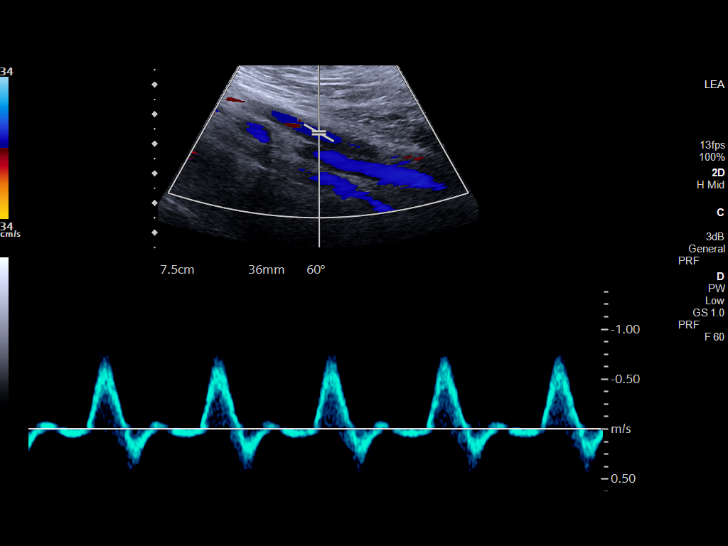
[im 14/20]
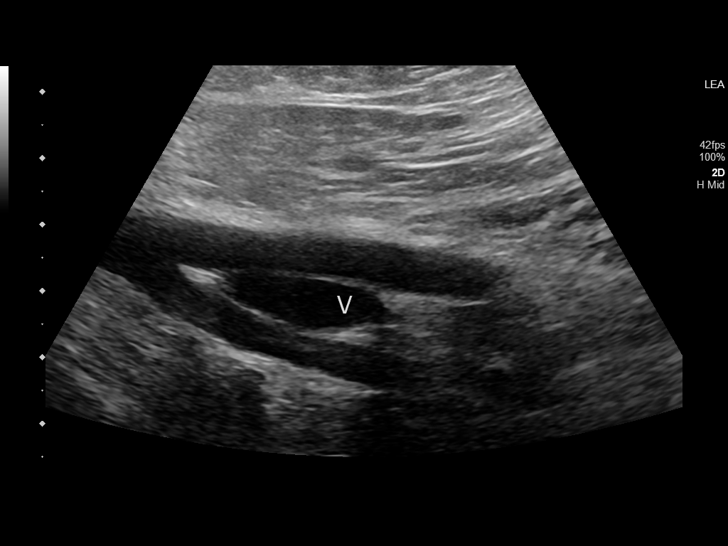
[im 16/20]
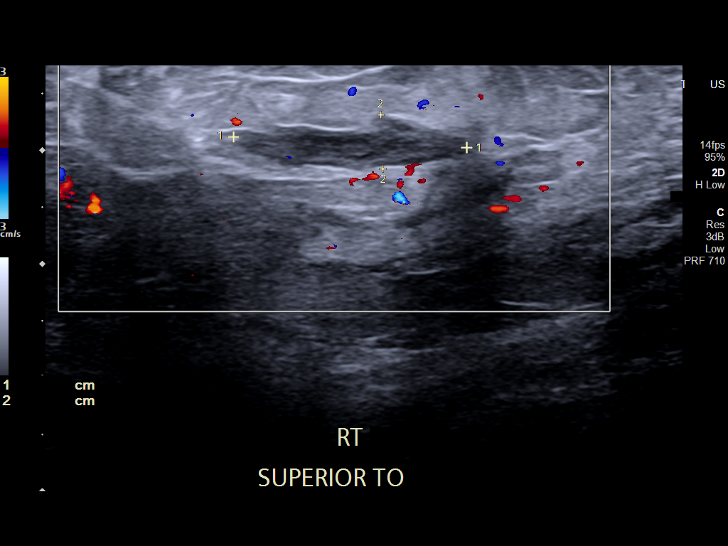
[im 17/20]
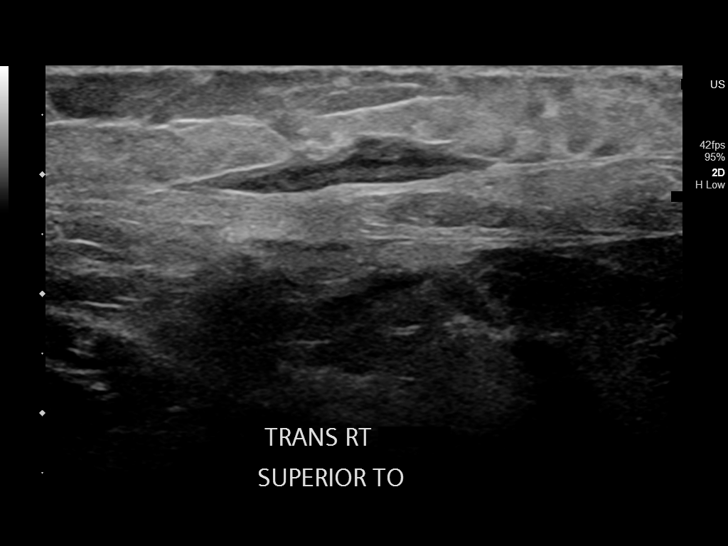
[im 18/20]
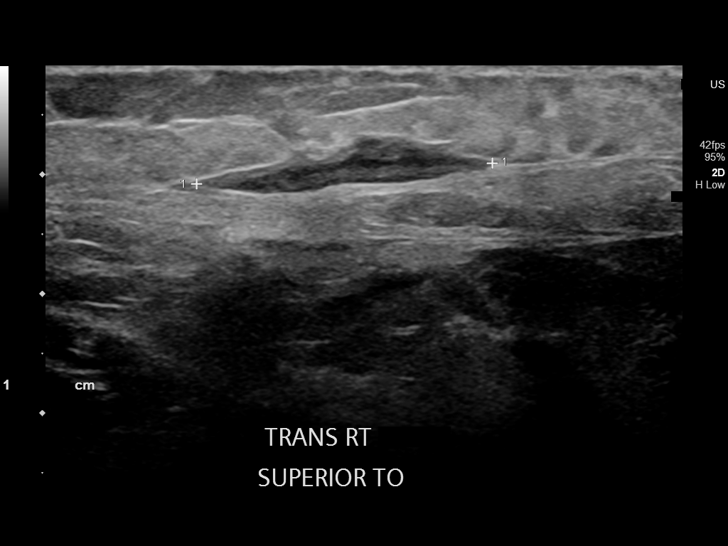
[im 20/20]
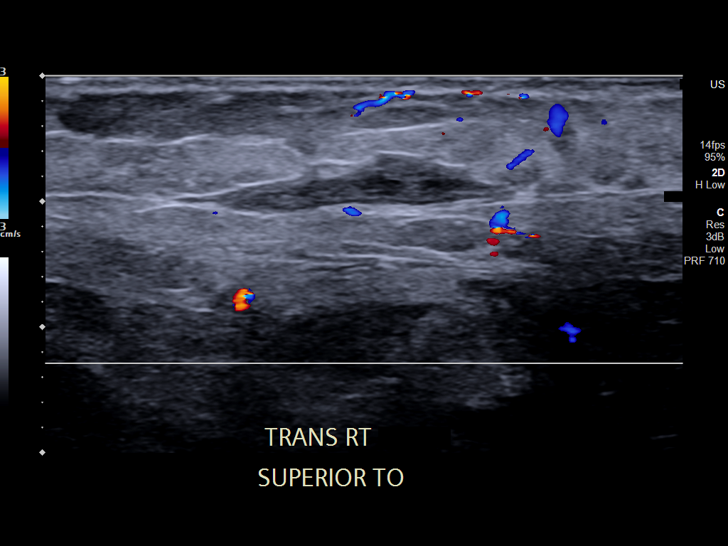

[14 of 20 positions shown; findings below may reference images not displayed]

FINDINGS: Focused ultrasound of the right groin demonstrates normal common
femoral artery and vein. No evidence of pseudoaneurysm. There is a
2.1 x 0.5 x 2.5 cm complex hypoechoic fluid collection superficial
to the common femoral vessels.
IMPRESSION: 1. 2.5 cm complex fluid collection superficial to the common femoral
vessels in the right groin most consistent with hematoma. No
evidence of pseudoaneurysm.

## 2022-04-04 IMAGING — CR DG CHEST 2V
2 series · 2 of 2 positions shown · non-contrast
Comparison: 12/19/2020 CT

CLINICAL DATA: Fever and back pain

EXAM:
CHEST - 2 VIEW

[chest pa]
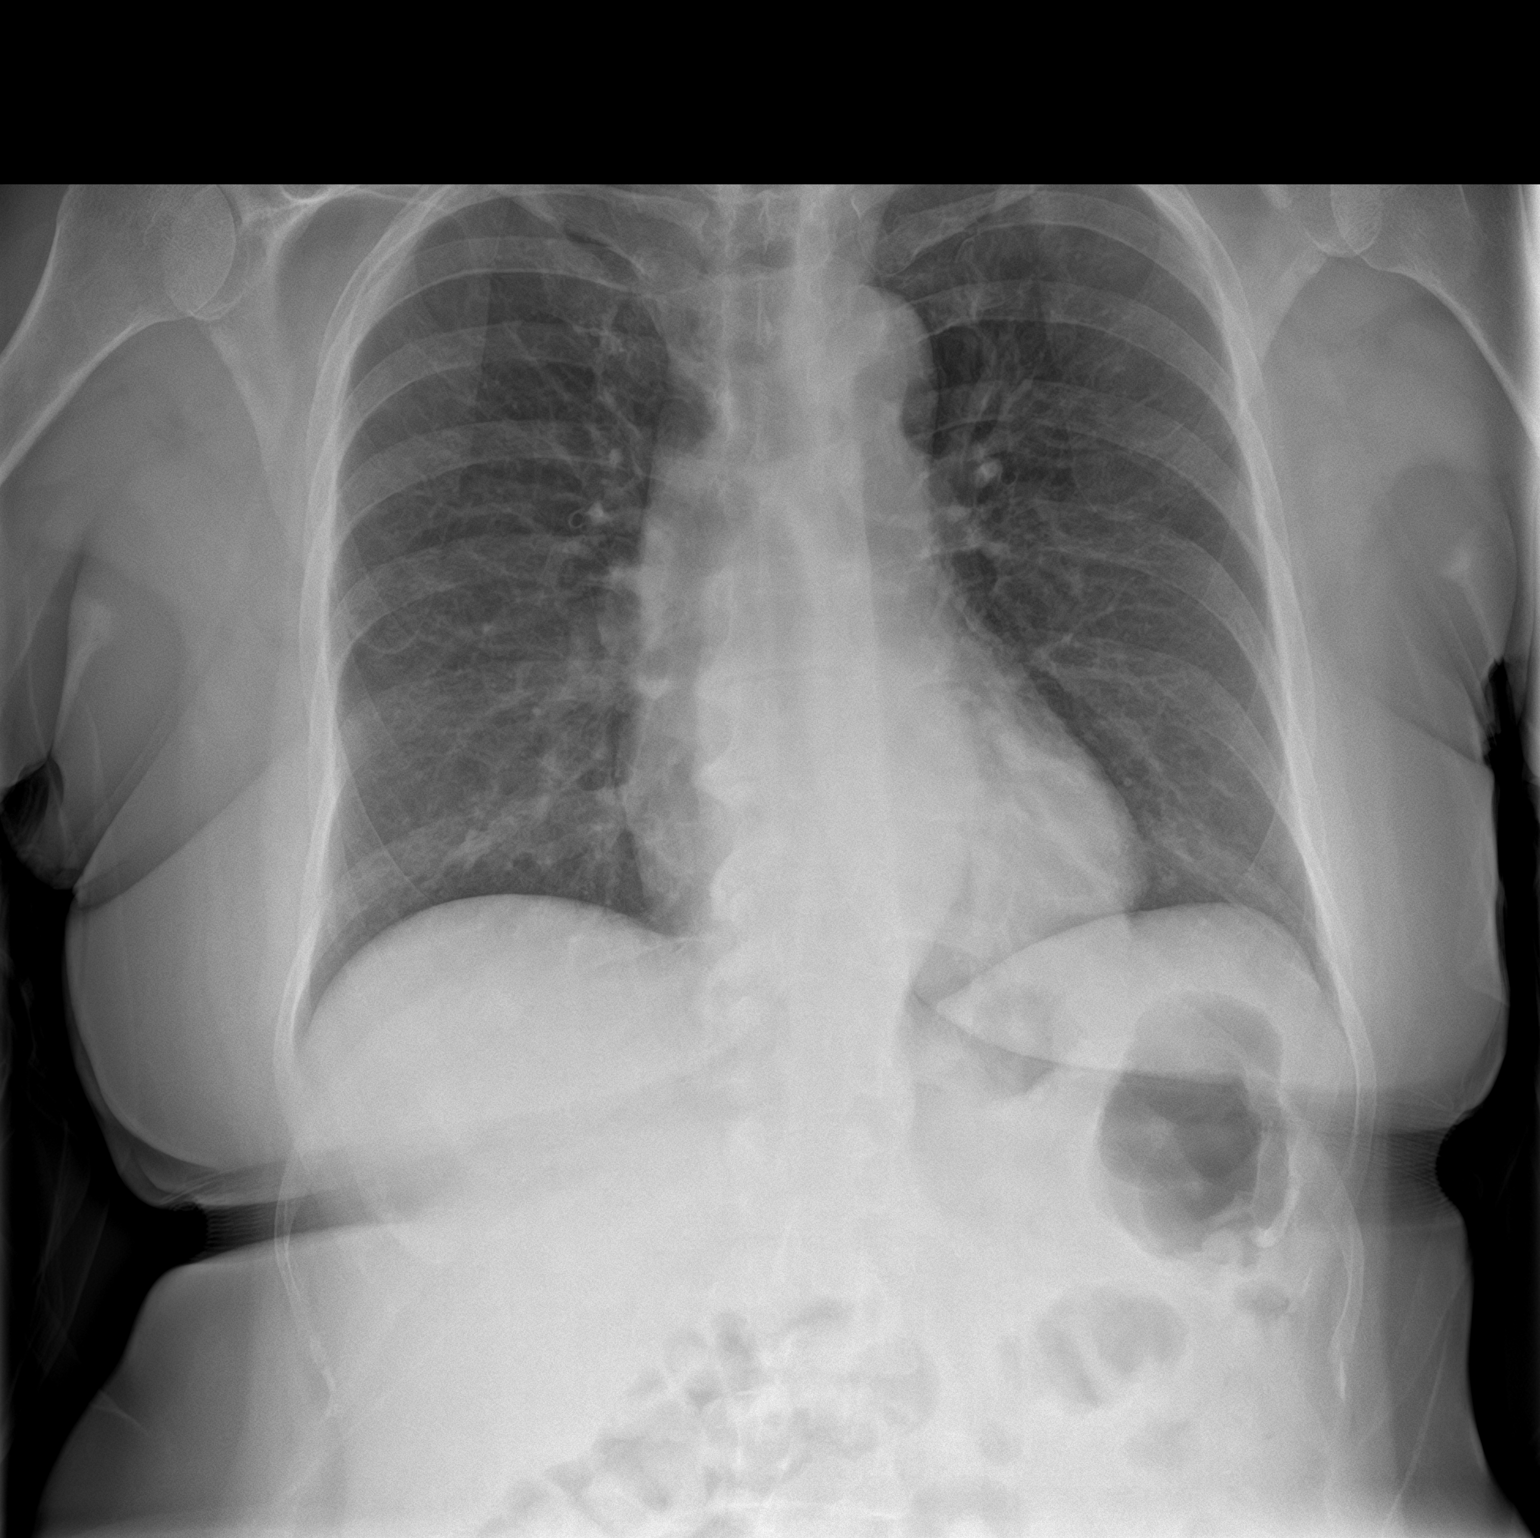

[chest lat]
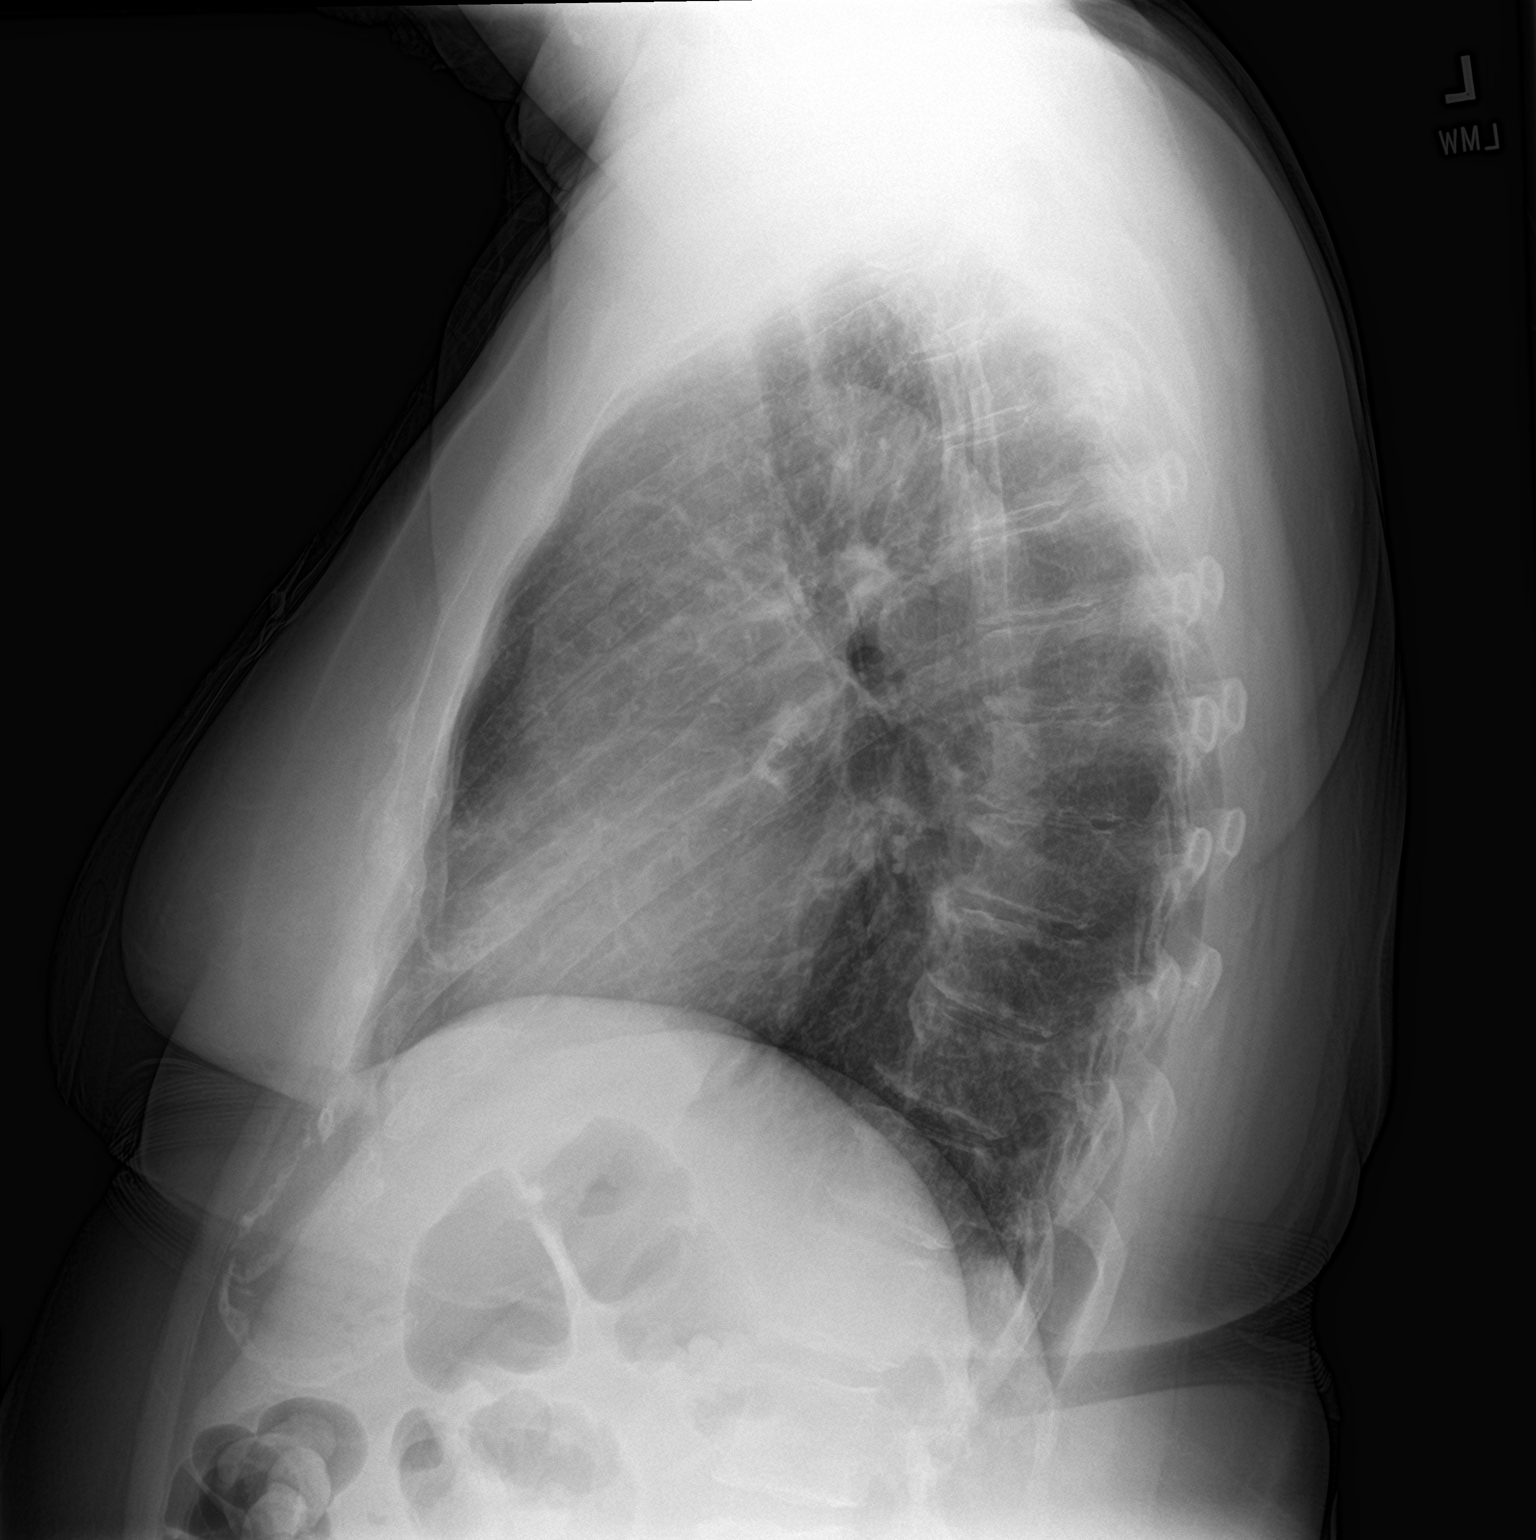

[2 of 2 positions shown; findings below may reference images not displayed]

FINDINGS: The heart size and mediastinal contours are within normal limits.
Both lungs are clear. The visualized skeletal structures are
unremarkable. Degenerative endplate spurring
IMPRESSION: No active cardiopulmonary disease.

## 2022-04-09 ENCOUNTER — Other Ambulatory Visit: Payer: Self-pay | Admitting: Surgery

## 2022-04-09 DIAGNOSIS — Z952 Presence of prosthetic heart valve: Secondary | ICD-10-CM

## 2022-04-10 ENCOUNTER — Ambulatory Visit (INDEPENDENT_AMBULATORY_CARE_PROVIDER_SITE_OTHER): Payer: Self-pay | Admitting: Surgery

## 2022-04-10 ENCOUNTER — Ambulatory Visit
Admission: RE | Admit: 2022-04-10 | Discharge: 2022-04-10 | Disposition: A | Discharge status: Home / Self Care | Payer: 59 | Source: Ambulatory Visit | Attending: Surgery | Admitting: Surgery

## 2022-04-10 ENCOUNTER — Ambulatory Visit: Payer: 59 | Admitting: Surgery

## 2022-04-10 ENCOUNTER — Encounter: Payer: Self-pay | Admitting: Surgery

## 2022-04-10 ENCOUNTER — Other Ambulatory Visit: Payer: Self-pay | Admitting: Surgery

## 2022-04-10 VITALS — BP 161/72 | HR 70 | Resp 18 | Ht 66.0 in | Wt 159.0 lb

## 2022-04-10 DIAGNOSIS — I7121 Aneurysm of the ascending aorta, without rupture: Secondary | ICD-10-CM

## 2022-04-10 DIAGNOSIS — Z952 Presence of prosthetic heart valve: Secondary | ICD-10-CM

## 2022-04-10 DIAGNOSIS — J984 Other disorders of lung: Secondary | ICD-10-CM | POA: Diagnosis not present

## 2022-04-10 DIAGNOSIS — I350 Nonrheumatic aortic (valve) stenosis: Secondary | ICD-10-CM

## 2022-04-10 DIAGNOSIS — J90 Pleural effusion, not elsewhere classified: Secondary | ICD-10-CM

## 2022-04-10 NOTE — Progress Notes (Signed)
HPI:  The patient returns today for follow-up status post supra-coronary replacement of the ascending aorta under deep hypothermic circulatory arrest and aortic valve replacement using a 23 mm Edwards Inspiris pericardial valve on 02/14/2022.  She had stage D3, paradoxical low gradient/low flow severe bicuspid aortic stenosis with NYHA class III symptoms of exertional fatigue and shortness of breath as well as episodic dizziness. This was associated with a stable 4.8 cm fusiform ascending aortic aneurysm. Her initial postop course was uncomplicated. She returned with generalized weakness and shortness of breath and was noted to have a moderate to large size right pleural effusion. She was scheduled for right thoracentesis as an outpatient but when she presented to radiology she was noted to be hypotensive and orthostatic and the procedure was canceled. She was admitted on 02/14/2022 for further work-up. A 2D echocardiogram showed normal left ventricular systolic function. The aortic valve is functioning normally with no evidence of paravalvular leak. There is a small pericardial effusion over the right ventricle. She subsequently underwent a right thoracentesis on 03/08/2022 while hospitalized with removal of 1 L of amber serosanguineous fluid.  She notes that the procedure was stopped before all the fluid was drained due to some pleuritic chest pain and coughing.  She still had some right pleural effusion following that thoracentesis but it was significantly less and her right lower lobe aeration improved. She was treated with a course of Zosyn and vancomycin since she reported some fever to 102 at home. She remained afebrile in the hospital with a normal white blood cell count. Blood cultures were negative and antibiotics were changed to p.o. Augmentin for 1 week. She also developed some atrial fibrillation and was started on intravenous amiodarone with conversion. She was sent home on oral amiodarone 200 mg  daily.  She has completed the course of amiodarone.  When I last saw her in the office on 03/14/2022 chest x-ray still showed some right pleural effusion which I thought was probably small as well as right lower lobe atelectasis.  She had no peripheral edema.  I felt that the effusion and atelectasis should probably resolve on its own.  She has continued to remain active at home and is walking without chest pain or shortness of breath.  She still reports some wheezing when laying down at night as well as some coughing episodes that sometimes are severe enough to cause her to vomit.  She denies any chest pain or pressure.  She has had no dizziness or syncope.    Current Outpatient Medications  Medication Sig Dispense Refill   acetaminophen (TYLENOL) 325 MG tablet Take 2 tablets (650 mg total) by mouth every 6 (six) hours as needed for mild pain.     ALPRAZolam (XANAX) 0.25 MG tablet Take 1 tablet (0.25 mg total) by mouth 2 (two) times daily as needed. 60 tablet 0   amLODipine (NORVASC) 5 MG tablet Take 1 tablet (5 mg total) by mouth daily. 30 tablet 1   amoxicillin (AMOXIL) 500 MG capsule Take 2,000 mg by mouth See admin instructions. Take 2,000 mg by mouth one hour prior to dental procedures     aspirin EC 81 MG tablet Take 1 tablet (81 mg total) by mouth daily. Swallow whole. 30 tablet 12   blood glucose meter kit and supplies KIT Dispense based on patient and insurance preference. Use up to four times daily as directed. 1 each 0   estradiol (ESTRACE) 0.1 MG/GM vaginal cream INSERT 1/4 APPLICATORFUL VAGINALLY TWO  TIMES A WEEK AS DIRECTED (Patient taking differently: Place 1.15 Applicatorfuls vaginally 2 (two) times a week.) 42.5 g 5   fluticasone (FLONASE) 50 MCG/ACT nasal spray PLACE 2 SPRAYS INTO BOTH NOSTRILS DAILY. 16 g 2   glucose blood test strip Use up to 4 times a day as directed 100 each 0   Menthol, Topical Analgesic, (BIOFREEZE EX) Apply 1 application  topically 3 (three) times daily as  needed (neck pain).     metFORMIN (GLUCOPHAGE-XR) 750 MG 24 hr tablet Take 1 tablet (750 mg total) by mouth daily with breakfast. (Patient taking differently: Take 375-750 mg by mouth See admin instructions. Take 750 mg by mouth in the morning with breakfast as needed for elevated BGL and may decrease dose to 375 mg if BGL is low) 90 tablet 1   metoprolol tartrate (LOPRESSOR) 25 MG tablet Take 1 tablet (25 mg total) by mouth 2 (two) times daily. 60 tablet 1   rosuvastatin (CRESTOR) 5 MG tablet Take 1 tablet (5 mg total) by mouth at bedtime. 90 tablet 2   sodium fluoride (SF 5000 PLUS) 1.1 % CREA dental cream Brush teeth thoroughly for 2 minutes, do not rinse afterwards. 51 g 12   traMADol (ULTRAM) 50 MG tablet TAKE 2 TABLETS (100 MG TOTAL) BY MOUTH EVERY 6 (SIX) HOURS AS NEEDED. (Patient taking differently: Take 100 mg by mouth every 6 (six) hours as needed (for pain).) 240 tablet 5   No current facility-administered medications for this visit.     Physical Exam: BP (!) 161/72 (BP Location: Left Arm, Patient Position: Sitting)   Pulse 70   Resp 18   Ht _0  (1.676 m)   Wt 159 lb (72.1 kg)   LMP  (LMP Unknown)   SpO2 97% Comment: RA  BMI 25.66 kg/m  She looks well. Cardiac exam shows a regular rate and rhythm with normal heart sounds.  There is no murmur or rub. Lung exam reveals decreased breath sounds over the left lower lobe. The chest incision is healing well and the sternum is stable. There is no peripheral edema.  Diagnostic Tests:  Narrative & Impression  CLINICAL DATA:  Pleural effusion. Aortic valve replacement 02/14/2022   EXAM: CHEST - 2 VIEW   COMPARISON:  Two-view chest x-ray 03/14/2022   FINDINGS: Heart size is normal. Left lung is clear. A large right pleural effusion is again seen. Increased fluid is noted within the minor fissure. Progressive right lower lobe airspace disease is present.   IMPRESSION: 1. Similar appearance of large right pleural  effusion. 2. Progressive right lower lobe airspace disease. While this may represent atelectasis, the persistent nature raises concern for infection.     Electronically Signed   By: San Morelle M.D.   On: 04/10/2022 15:22      Impression:  Clinically I think she is continuing to progress but still has some wheezing and cough and her chest x-ray shows a moderate to large right pleural effusion which looks a little bit larger than it did on her previous x-ray to me.  There is associated progressive right lower lobe atelectasis.  Her cardiac silhouette appears normal.  There is no fluid on the left side.  I think it is best to proceed with another right thoracentesis to try to remove all of the remaining fluid.  This would have the best chance of allowing her right lower lobe to reexpand with continued use of her incentive spirometer.  She is in agreement with that.  Plan:  She will be scheduled for right thoracentesis using ultrasound guidance by interventional radiology as soon as possible.  I will follow-up on that procedure to be sure that the effusion is drained.  I will plan to see her back in the office in 2 weeks with a repeat chest x-ray to follow-up on the right pleural space.   Gaye Pollack, MD [facility] [phone]

## 2022-04-11 ENCOUNTER — Other Ambulatory Visit: Payer: Self-pay | Admitting: Surgery

## 2022-04-11 ENCOUNTER — Other Ambulatory Visit: Payer: Self-pay

## 2022-04-11 ENCOUNTER — Other Ambulatory Visit (HOSPITAL_COMMUNITY): Payer: Self-pay

## 2022-04-11 ENCOUNTER — Other Ambulatory Visit (HOSPITAL_COMMUNITY): Payer: Self-pay | Admitting: Radiology

## 2022-04-11 DIAGNOSIS — J90 Pleural effusion, not elsewhere classified: Secondary | ICD-10-CM

## 2022-04-11 DIAGNOSIS — Z952 Presence of prosthetic heart valve: Secondary | ICD-10-CM

## 2022-04-11 LAB — HM DIABETES EYE EXAM

## 2022-04-12 ENCOUNTER — Other Ambulatory Visit: Payer: Self-pay

## 2022-04-15 ENCOUNTER — Other Ambulatory Visit: Payer: Self-pay | Admitting: Surgery

## 2022-04-15 ENCOUNTER — Ambulatory Visit (HOSPITAL_COMMUNITY)
Admission: RE | Admit: 2022-04-15 | Discharge: 2022-04-15 | Disposition: A | Discharge status: Home / Self Care | Payer: 59 | Source: Ambulatory Visit | Attending: Surgery | Admitting: Surgery

## 2022-04-15 DIAGNOSIS — J90 Pleural effusion, not elsewhere classified: Secondary | ICD-10-CM | POA: Diagnosis not present

## 2022-04-15 DIAGNOSIS — J9811 Atelectasis: Secondary | ICD-10-CM | POA: Diagnosis not present

## 2022-04-15 HISTORY — PX: IR THORACENTESIS ASP PLEURAL SPACE W/IMG GUIDE: IMG5380

## 2022-04-15 MED ORDER — LIDOCAINE HCL 1 % IJ SOLN
INTRAMUSCULAR | Status: AC
  Administered 2022-04-15: 10 mL
  Filled 2022-04-15: qty 20

## 2022-04-15 NOTE — Procedures (Signed)
PROCEDURE SUMMARY:  Successful US guided right thoracentesis. Yielded 500 ml of yellow fluid. Pt tolerated procedure well. No immediate complications.  Specimen not sent for labs. CXR ordered; no post-procedure pneumothorax identified  EBL < 2 mL  Mickie Kay, NP 04/15/2022 9:38 AM

## 2022-04-16 ENCOUNTER — Ambulatory Visit: Payer: 59 | Admitting: Internal Medicine

## 2022-04-16 ENCOUNTER — Encounter: Payer: Self-pay | Admitting: Internal Medicine

## 2022-04-16 ENCOUNTER — Other Ambulatory Visit: Payer: Self-pay

## 2022-04-16 VITALS — BP 140/82 | HR 71 | Temp 98.5°F | Ht 66.0 in | Wt 156.4 lb

## 2022-04-16 DIAGNOSIS — J90 Pleural effusion, not elsewhere classified: Secondary | ICD-10-CM

## 2022-04-16 DIAGNOSIS — E1169 Type 2 diabetes mellitus with other specified complication: Secondary | ICD-10-CM

## 2022-04-16 DIAGNOSIS — E782 Mixed hyperlipidemia: Secondary | ICD-10-CM

## 2022-04-16 DIAGNOSIS — Z1231 Encounter for screening mammogram for malignant neoplasm of breast: Secondary | ICD-10-CM

## 2022-04-16 DIAGNOSIS — I10 Essential (primary) hypertension: Secondary | ICD-10-CM | POA: Diagnosis not present

## 2022-04-16 DIAGNOSIS — Z09 Encounter for follow-up examination after completed treatment for conditions other than malignant neoplasm: Secondary | ICD-10-CM

## 2022-04-16 DIAGNOSIS — R748 Abnormal levels of other serum enzymes: Secondary | ICD-10-CM | POA: Insufficient documentation

## 2022-04-16 MED ORDER — TRAMADOL HCL 50 MG PO TABS
100.0000 mg | ORAL_TABLET | Freq: Four times a day (QID) | ORAL | 5 refills | Status: DC | PRN
  Filled 2022-04-16 – 2022-04-19 (×2): qty 240, 30d supply, fill #0
  Filled 2022-05-23: qty 240, 30d supply, fill #1
  Filled 2022-06-26: qty 240, 30d supply, fill #2
  Filled 2022-08-01: qty 240, 30d supply, fill #3
  Filled 2022-09-06: qty 240, 30d supply, fill #4

## 2022-04-16 MED ORDER — HYDROCODONE-ACETAMINOPHEN 10-325 MG PO TABS
1.0000 | ORAL_TABLET | Freq: Four times a day (QID) | ORAL | 0 refills | Status: DC | PRN
  Filled 2022-04-16: qty 30, 8d supply, fill #0

## 2022-04-16 NOTE — Assessment & Plan Note (Signed)
Patient is stable post discharge and has no new issues or questions about discharge plans at the visit today for hospital follow up. All labs , imaging studies and progress notes from admission were reviewed with patient today   

## 2022-04-16 NOTE — Progress Notes (Signed)
Subjective:  Patient ID: Courtney Lopez, female    DOB: 10-Jul-1960  Age: 61 y.o. MRN: [id number]  CC: The primary encounter diagnosis was Primary hypertension. Diagnoses of DM type 2 with diabetic mixed hyperlipidemia ([place]), Encounter for screening mammogram for malignant neoplasm of breast, Recurrent pleural effusion on right, Elevated liver enzymes, and Hospital discharge follow-up were also pertinent to this visit.   HPI Courtney Lopez presents for hospital follow up and tramadol refill  Chief Complaint  Patient presents with   Hospitalization [facility] follow up for failure to thrive   Follow-up    6 month follow up for tramadol refill   [place] WAS Hospitalized at [facility] on  October 5  for supra coronary replacement of her ascending aorta and aortic valve on 02/14/2022.  The surgery was done by Dr Cyndia Bent after she became acutely symptomatic .  Her surgery required chest tubes x 3,  had a hypoglycemic event x 2  with subsequent hypothermia (due to aggressive glycemic control with  insulin administration ) post operatively.  Her post operative course also complicated by  atrial fibrillation , treated with amiodarone,  followed by hypertension requiring amlodipine .  She was discharged on metoprolol and amlodipine.    She was readmitted October 26 with hypotension and a large  right sided pleural effusion,underwent  thoracentesis of 1 L on Oct 27, and discharged home on November 1  She had re accumulation requiring a second therapeutic thoracentesis  which was done on Dec 4 and removed 500 cc's  She  Woke this morning with recurrence of pleuritic pain ,  did not wheeze last night   She had a  FALL AT home  on the steps Saturday carrying laundry down the stars.  4 steps.  Landed on buttocks . Left foot was underneath her  did not strike chest on anything . foot is swollen but not bruised.  Use a crutches   Outpatient Medications Prior to Visit  Medication Sig  Dispense Refill   acetaminophen (TYLENOL) 325 MG tablet Take 2 tablets (650 mg total) by mouth every 6 (six) hours as needed for mild pain.     ALPRAZolam (XANAX) 0.25 MG tablet Take 1 tablet (0.25 mg total) by mouth 2 (two) times daily as needed. 60 tablet 0   amLODipine (NORVASC) 5 MG tablet Take 1 tablet (5 mg total) by mouth daily. 30 tablet 1   aspirin EC 81 MG tablet Take 1 tablet (81 mg total) by mouth daily. Swallow whole. 30 tablet 12   blood glucose meter kit and supplies KIT Dispense based on patient and insurance preference. Use up to four times daily as directed. 1 each 0   estradiol (ESTRACE) 0.1 MG/GM vaginal cream INSERT 1/4 APPLICATORFUL VAGINALLY TWO TIMES A WEEK AS DIRECTED (Patient taking differently: Place 5.91 Applicatorfuls vaginally 2 (two) times a week.) 42.5 g 5   fluticasone (FLONASE) 50 MCG/ACT nasal spray PLACE 2 SPRAYS INTO BOTH NOSTRILS DAILY. 16 g 2   glucose blood test strip Use up to 4 times a day as directed 100 each 0   HYDROcodone-Acetaminophen 10-300 MG TABS Take 1 tablet by mouth every 6 (six) hours as needed.     Menthol, Topical Analgesic, (BIOFREEZE EX) Apply 1 application  topically 3 (three) times daily as needed (neck pain).     metFORMIN (GLUCOPHAGE-XR) 750 MG 24 hr tablet Take 1 tablet (750 mg total) by mouth daily with breakfast. (Patient taking differently: Take 375-750  mg by mouth See admin instructions. Take 750 mg by mouth in the morning with breakfast as needed for elevated BGL and may decrease dose to 375 mg if BGL is low) 90 tablet 1   metoprolol tartrate (LOPRESSOR) 25 MG tablet Take 1 tablet (25 mg total) by mouth 2 (two) times daily. 60 tablet 1   rosuvastatin (CRESTOR) 5 MG tablet Take 1 tablet (5 mg total) by mouth at bedtime. 90 tablet 2   Semaglutide,0.25 or 0.5MG/DOS, (OZEMPIC, 0.25 OR 0.5 MG/DOSE,) 2 MG/3ML SOPN Inject into the skin. On hold     sodium fluoride (SF 5000 PLUS) 1.1 % CREA dental cream Brush teeth thoroughly for 2 minutes,  do not rinse afterwards. 51 g 12   traMADol (ULTRAM) 50 MG tablet TAKE 2 TABLETS (100 MG TOTAL) BY MOUTH EVERY 6 (SIX) HOURS AS NEEDED. (Patient taking differently: Take 100 mg by mouth every 6 (six) hours as needed (for pain).) 240 tablet 5   amoxicillin (AMOXIL) 500 MG capsule Take 2,000 mg by mouth See admin instructions. Take 2,000 mg by mouth one hour prior to dental procedures (Patient not taking: Reported on 04/16/2022)     No facility-administered medications prior to visit.    Review of Systems;  Patient denies headache, fevers, malaise, unintentional weight loss, skin rash, eye pain, sinus congestion and sinus pain, sore throat, dysphagia,  hemoptysis , cough, dyspnea, wheezing, chest pain, palpitations, orthopnea, edema, abdominal pain, nausea, melena, diarrhea, constipation, flank pain, dysuria, hematuria, urinary  Frequency, nocturia, numbness, tingling, seizures,  Focal weakness, Loss of consciousness,  Tremor, insomnia, depression, anxiety, and suicidal ideation.      Objective:  BP (!) 140/82   Pulse 71   Temp 98.5 F (36.9 C) (Oral)   Ht _0  (1.676 m)   Wt 156 lb 6.4 oz (70.9 kg)   LMP  (LMP Unknown)   SpO2 96%   BMI 25.24 kg/m   BP Readings from Last 3 Encounters:  04/16/22 (!) 140/82  04/15/22 (!) 144/82  04/10/22 (!) 161/72    Wt Readings from Last 3 Encounters:  04/16/22 156 lb 6.4 oz (70.9 kg)  04/10/22 159 lb (72.1 kg)  03/14/22 159 lb 1.6 oz (72.2 kg)    General appearance: alert, cooperative and appears stated age Ears: normal TM's and external ear canals both ears Throat: lips, mucosa, and tongue normal; teeth and gums normal Neck: no adenopathy, no carotid bruit, supple, symmetrical, trachea midline and thyroid not enlarged, symmetric, no tenderness/mass/nodules Back: symmetric, no curvature. ROM normal. No CVA tenderness. Lungs: clear to auscultation bilaterally Heart: regular rate and rhythm, S1, S2 normal, no murmur, click, rub or  gallop Abdomen: soft, non-tender; bowel sounds normal; no masses,  no organomegaly Pulses: 2+ and symmetric Skin: Skin color, texture, turgor normal. No rashes or lesions Lymph nodes: Cervical, supraclavicular, and axillary nodes normal. Neuro:  awake and interactive with normal mood and affect. Higher cortical functions are normal. Speech is clear without word-finding difficulty or dysarthria. Extraocular movements are intact. Visual fields of both eyes are grossly intact. Sensation to light touch is grossly intact bilaterally of upper and lower extremities. Motor examination shows 4+/5 symmetric hand grip and upper extremity and 5/5 lower extremity strength. There is no pronation or drift. Gait is non-ataxic   Lab Results  Component Value Date   HGBA1C 5.4 02/12/2022   HGBA1C 5.8 (H) 09/21/2021   HGBA1C 6.6 (H) 03/23/2021    Lab Results  Component Value Date   CREATININE 1.00 03/11/2022  CREATININE 1.07 (H) 03/10/2022   CREATININE 1.25 (H) 03/09/2022    Lab Results  Component Value Date   WBC 10.4 03/11/2022   HGB 8.9 (L) 03/11/2022   HCT 28.4 (L) 03/11/2022   PLT 570 (H) 03/11/2022   GLUCOSE 117 (H) 03/11/2022   CHOL 132 09/21/2021   TRIG 63 09/21/2021   HDL 62 09/21/2021   LDLDIRECT 101.0 11/11/2016   LDLCALC 57 09/21/2021   ALT 51 (H) 03/07/2022   AST 70 (H) 03/07/2022   NA 139 03/11/2022   K 4.7 03/11/2022   CL 103 03/11/2022   CREATININE 1.00 03/11/2022   BUN 7 (L) 03/11/2022   CO2 26 03/11/2022   TSH 3.03 06/29/2020   INR 1.5 (H) 02/14/2022   HGBA1C 5.4 02/12/2022   MICROALBUR 6.2 (H) 12/18/2020    IR THORACENTESIS ASP PLEURAL SPACE W/IMG GUIDE  Result Date: 04/15/2022 INDICATION: Post supra coronary replacement of her ascending aorta and aortic valve on 02/14/2022. Now with right pleural effusion. Request for therapeutic thoracentesis EXAM: ULTRASOUND GUIDED THORACENTESIS MEDICATIONS: 1% lidocaine 10 mL COMPLICATIONS: None immediate. PROCEDURE: An ultrasound  guided thoracentesis was thoroughly discussed with the patient and questions answered. The benefits, risks, alternatives and complications were also discussed. The patient understands and wishes to proceed with the procedure. Written consent was obtained. Ultrasound was performed to localize and mark an adequate pocket of fluid in the right chest. The area was then prepped and draped in the normal sterile fashion. 1% Lidocaine was used for local anesthesia. Under ultrasound guidance a 6 Fr Safe-T-Centesis catheter was introduced. Thoracentesis was performed. The catheter was removed and a dressing applied. FINDINGS: A total of approximately 500 mL of yellow fluid was removed. IMPRESSION: Successful ultrasound guided right thoracentesis yielding 500 mL of pleural fluid. Read by: Soyla Dryer, NP Electronically Signed   By: Aletta Edouard M.D.   On: 04/15/2022 09:40   DG Chest 1 View  Result Date: 04/15/2022 CLINICAL DATA:  Status post thoracentesis, right pleural effusion EXAM: CHEST  1 VIEW COMPARISON:  04/10/2022 FINDINGS: Interval thoracentesis. Trace right pleural effusion. Mild right basilar atelectasis. No pneumothorax. Left lung is clear. No focal consolidation. Heart and mediastinal contours are unremarkable. Aortic valve replacement and median sternotomy. No acute osseous abnormality. IMPRESSION: 1. Interval thoracentesis. No pneumothorax. Trace right pleural effusion. Electronically Signed   By: Kathreen Devoid M.D.   On: 04/15/2022 09:25    Assessment & Plan:   Problem List Items Addressed This Visit     DM type 2 with diabetic mixed hyperlipidemia ([place])    Excellent glycemic control with  metformin XR. Ozempic stopped preoperatively for aortic valve replacement. Her   appetite has not returned;  advised to continue suspension for now until appetite becomes problematic.   Continue ARB and statin    Lab Results  Component Value Date   HGBA1C 5.4 02/12/2022   Lab Results  Component Value  Date   MICROALBUR 6.2 (H) 12/18/2020   MICROALBUR 3.9 (H) 10/20/2019          Relevant Medications   Semaglutide,0.25 or 0.5MG/DOS, (OZEMPIC, 0.25 OR 0.5 MG/DOSE,) 2 MG/3ML SOPN   Other Relevant Orders   Urine Microalbumin w/creat. ratio   Lipid Profile   Direct LDL   Comp Met (CMET)   HgB A1c   Elevated liver enzymes    Likely related to recent surgery.  She will return for repeat cmet at her convenience   Lab Results  Component Value Date   ALT 51 (  H) 03/07/2022   AST 70 (H) 03/07/2022   ALKPHOS 84 03/07/2022   BILITOT 0.9 03/07/2022        Relevant Orders   Comprehensive metabolic panel   Hospital discharge follow-up    Patient is stable post discharge and has no new issues or questions about discharge plans at the visit today for hospital follow up. All labs , imaging studies and progress notes from admission were reviewed with patient today         Hypertension - Primary   Relevant Orders   Urine Microalbumin w/creat. ratio   Recurrent pleural effusion on right   Relevant Orders   DG Chest 2 View   Screening for breast cancer   Relevant Orders   MM 3D SCREEN BREAST BILATERAL    Follow-up: No follow-ups on file.   Crecencio Mc, MD

## 2022-04-16 NOTE — Patient Instructions (Addendum)
Call me if you feel you need a chest x ray done prior to your follow up with Dr Laneta Simmers  No ozempic until you start gaining weight  Postpone the flu vaccine until the effusion resolves

## 2022-04-16 NOTE — Assessment & Plan Note (Signed)
Excellent glycemic control with  metformin XR. Ozempic stopped preoperatively for aortic valve replacement. Her   appetite has not returned;  advised to continue suspension for now until appetite becomes problematic.   Continue ARB and statin    Lab Results  Component Value Date   HGBA1C 5.4 02/12/2022   Lab Results  Component Value Date   MICROALBUR 6.2 (H) 12/18/2020   MICROALBUR 3.9 (H) 10/20/2019

## 2022-04-16 NOTE — Assessment & Plan Note (Signed)
Likely related to recent surgery.  She will return for repeat cmet at her convenience   Lab Results  Component Value Date   ALT 51 (H) 03/07/2022   AST 70 (H) 03/07/2022   ALKPHOS 84 03/07/2022   BILITOT 0.9 03/07/2022

## 2022-04-19 ENCOUNTER — Other Ambulatory Visit: Payer: Self-pay

## 2022-04-19 IMAGING — US US THYROID
1 series · 13 of 25 positions shown · non-contrast
Comparison: 11/08/2019, 04/22/2018

CLINICAL DATA: Prior ultrasound follow-up.

EXAM:
THYROID ULTRASOUND
TECHNIQUE: Ultrasound examination of the thyroid gland and adjacent soft
tissues was performed.

[Series 1: us thyroid · 0.08mm/px · 54 acquisitions, 13 frames shown]
[im 1/54]
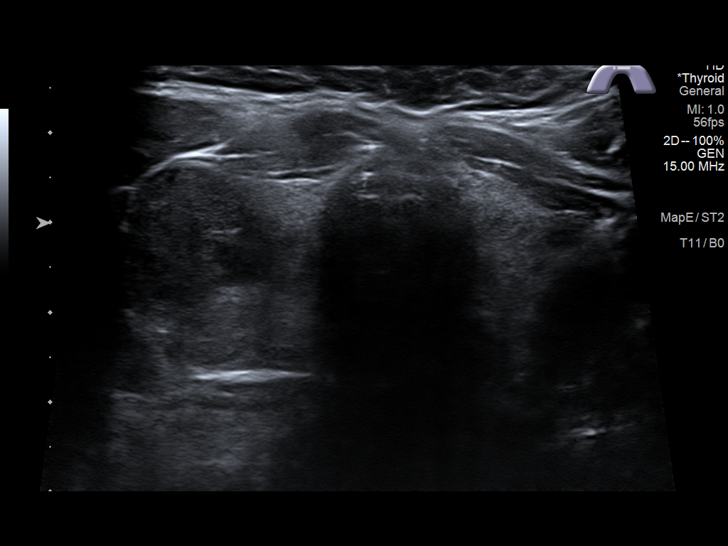
[im 5/54]
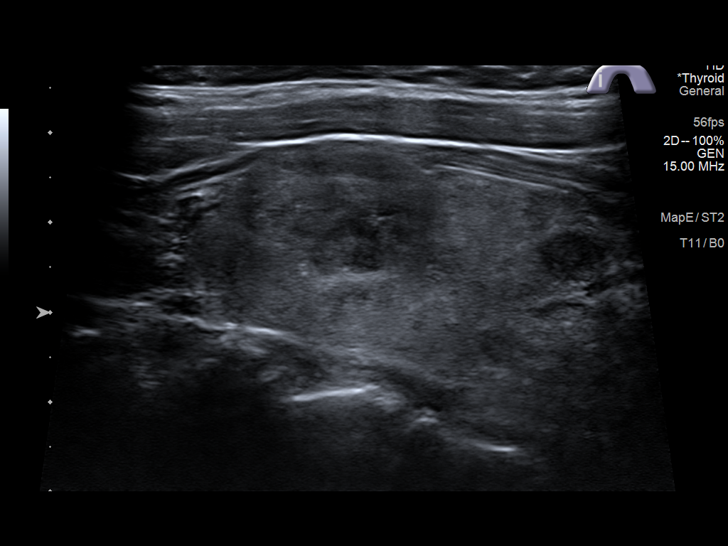
[im 9/54]
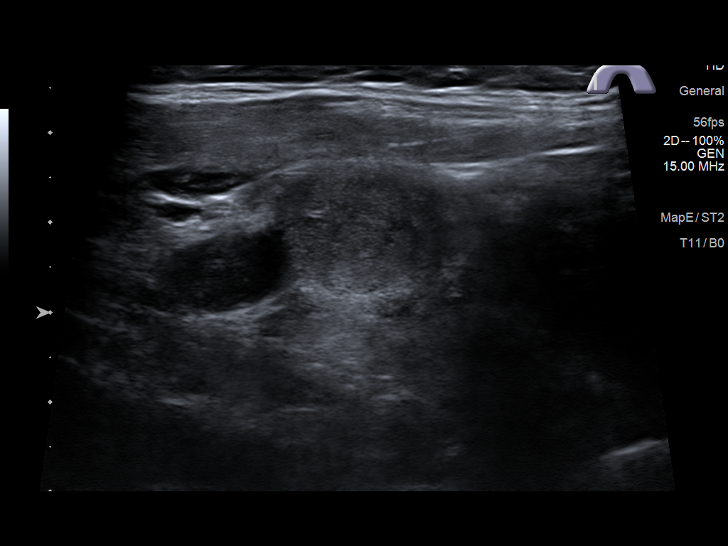
[im 14/54]
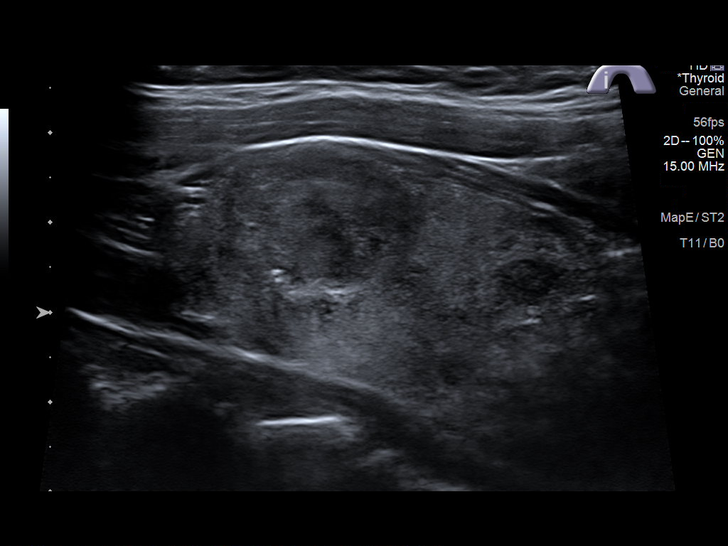
[im 18/54]
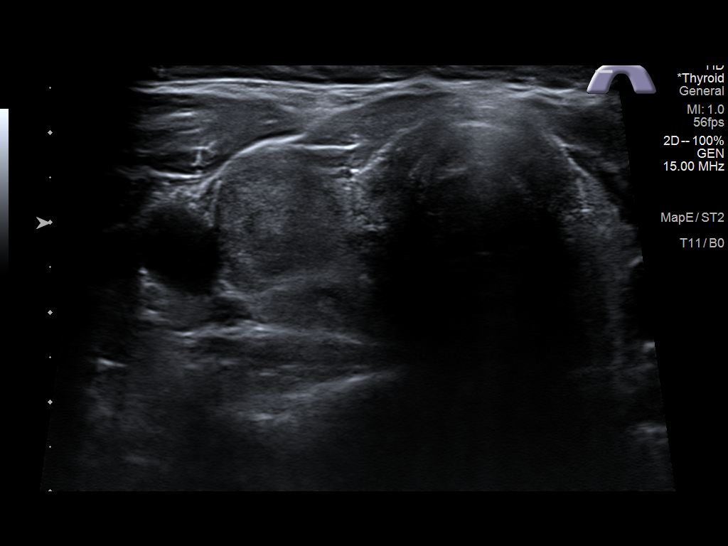
[im 23/54]
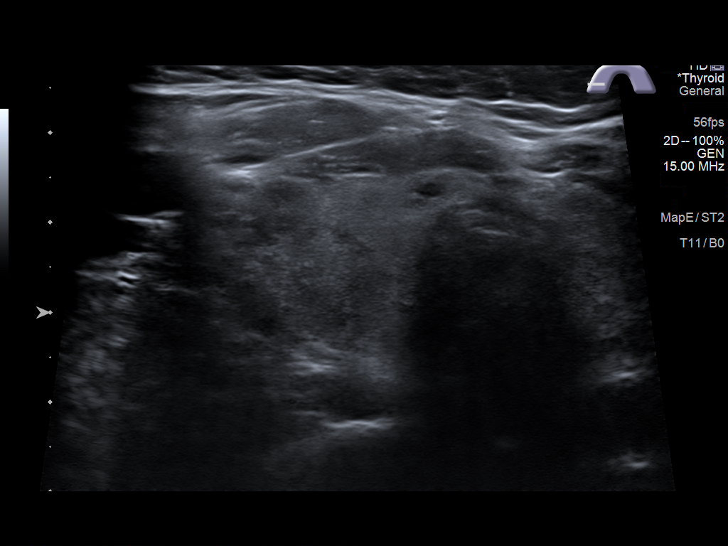
[im 27/54]
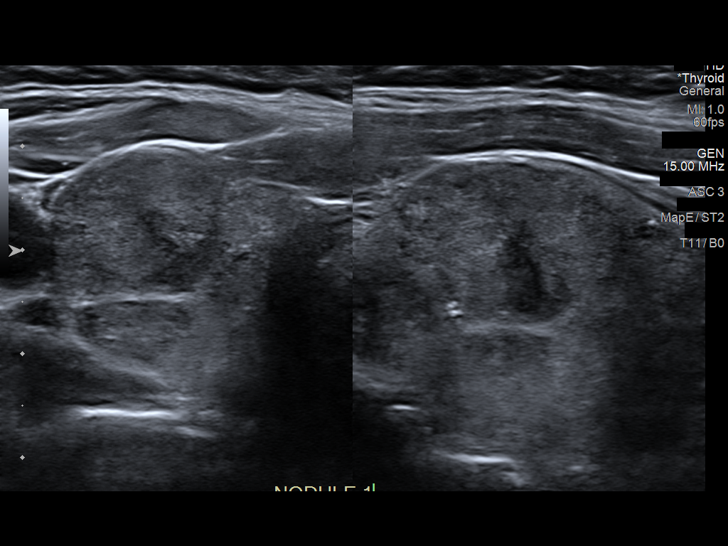
[im 31/54]
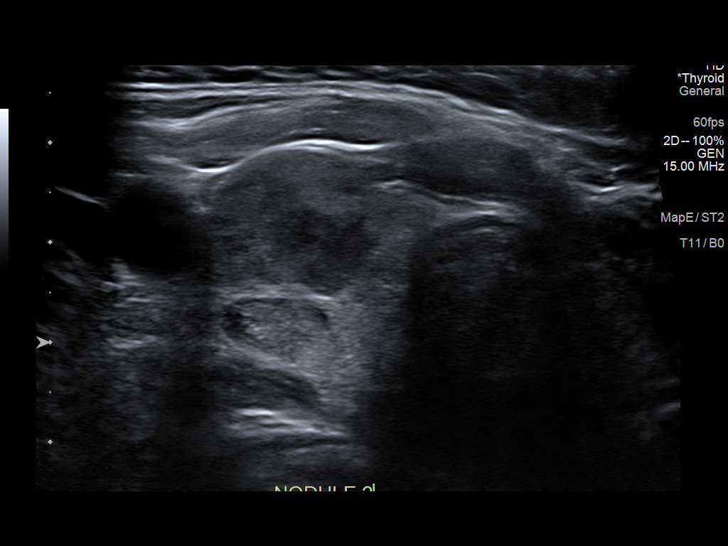
[im 36/54]
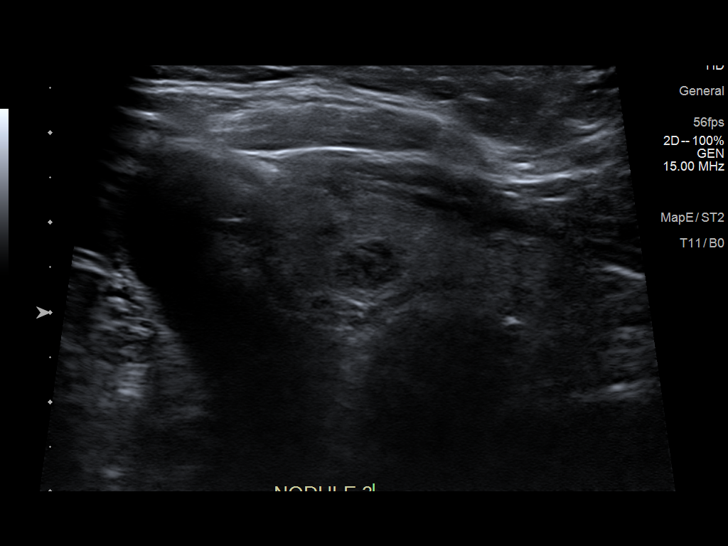
[im 40/54]
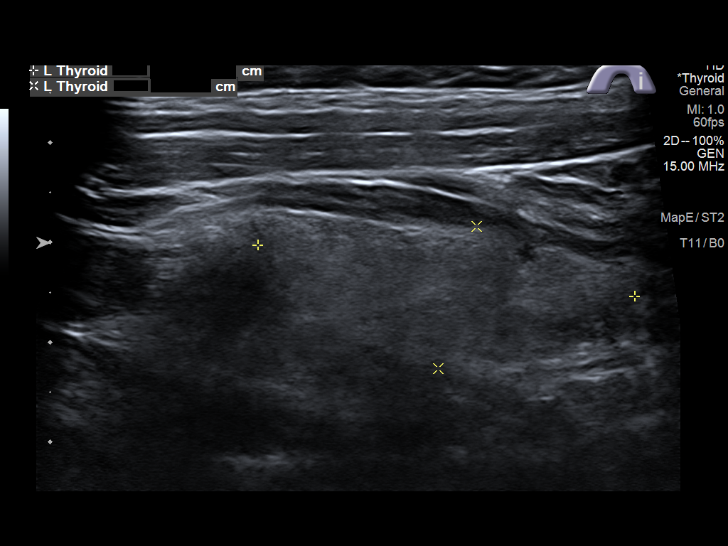
[im 45/54]
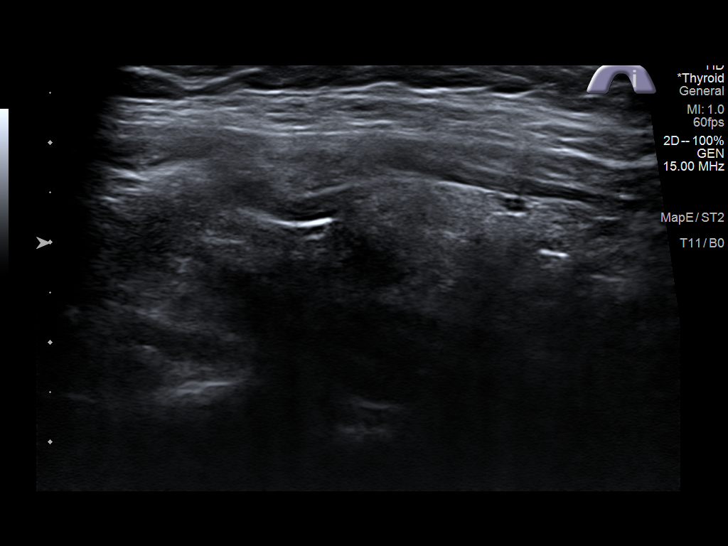
[im 49/54]
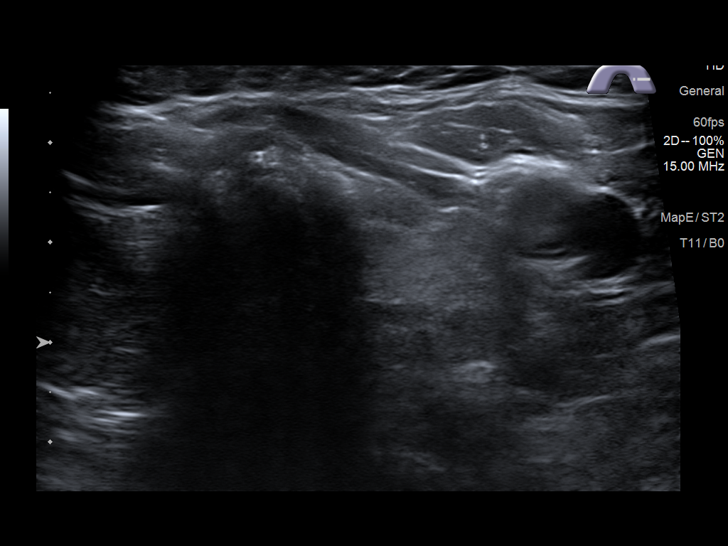
[im 54/54]
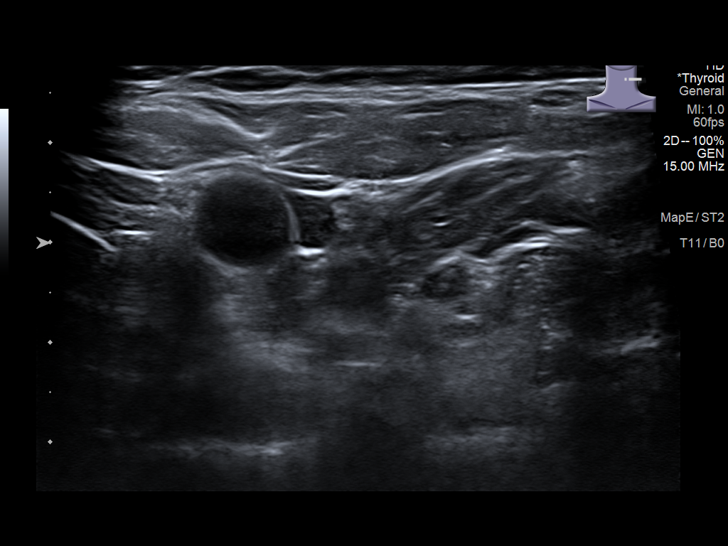

[13 of 25 positions shown; findings below may reference images not displayed]

FINDINGS: Parenchymal Echotexture: Moderately heterogenous

Isthmus: 0.4 cm, previously 0.4 cm

Right lobe: 4.8 x 2.2 x 2.2 cm, previously 5.2 x 2.4 x 2.2 cm

Left lobe: 3.8 x 1.5 x 1.2 cm, previously 4.3 x 1.2 x 1.4 cm

_________________________________________________________

Estimated total number of nodules >/= 1 cm: 2

Number of spongiform nodules >/=  2 cm not described below (TR1): 0

Number of mixed cystic and solid nodules >/= 1.5 cm not described
below (TR2): 0

_________________________________________________________

Nodule # 1:

Prior biopsy: No

Location: Right; Mid

Maximum size: 2.2 cm; Other 2 dimensions: 1.6 x 1.5 cm, previously,
2.0 x 1.7 x 1.5 cm

Composition: solid/almost completely solid (2)

Echogenicity: isoechoic (1)

Shape: not taller-than-wide (0)

Margins: smooth (0)

Echogenic foci: none (0)

ACR TI-RADS total points: 3.

ACR TI-RADS risk category:  TR3 (3 points).

Significant change in size (>/= 20% in two dimensions and minimal
increase of 2 mm): No

Change in features: No

Change in ACR TI-RADS risk category: No

ACR TI-RADS recommendations:

*Given size (>/= 1.5 - 2.4 cm) and appearance, a follow-up
ultrasound in 1 year should be considered based on TI-RADS criteria.

_________________________________________________________

Unchanged appearance of benign appearing posterior right middle lobe
solid thyroid nodule (labeled 2, 1.1 cm, previously 1.2 cm).
Unchanged subcentimeter spongiform appearing right inferior pole
thyroid nodule. No new discrete nodules. No cervical
lymphadenopathy.
IMPRESSION: Minimal interval increased size of previously visualized right
thyroid nodule (labeled 1, 2.2 cm, previously 2.0 cm). This nodule
continues to meet criteria (TI-RADS category 3) for 1 year
ultrasound surveillance.

The above is in keeping with the ACR TI-RADS recommendations - [HOSPITAL] 9901;[DATE].

## 2022-04-24 NOTE — Progress Notes (Unsigned)
Cardiology Office Note   Date:  04/25/2022   ID:  Courtney Lopez, DOB 1961-05-10, MRN [id number]  PCP:  Crecencio Mc, MD  Cardiologist:  Dr. Rockey Situ  Chief Complaint  Patient presents with   OTHER    6 Month f/u hosp. Aortic stenosis/valve replacement. Pt would like to discuss renal u/s testing.  Meds reviewed verbally with pt.       History of Present Illness: Courtney Lopez is a 61 y.o. female who is here today for a follow-up visit regarding renal artery stenosis.  She has known history of bicuspid aortic valve with ascending aortic aneurysm, renal artery stenosis, hyperlipidemia and type 2 diabetes. She had CTA of the aorta in 2020 which showed evidence of significant left renal artery stenosis with lack of significant calcifications. This was suggestive of fibromuscular dysplasia. Left renal artery stenosis was confirmed with renal artery duplex. Repeat renal artery duplex in 2021 showed significant stenosis in the mid left renal artery with peak velocity of 439.  Carotid Doppler showed no significant disease.   Blood pressure was initially controlled with medications with stable renal function and thus renal artery angiography was not pursued.   However, she had worsening renal function and blood pressure control in late 2022. I proceeded with angiography in November 2022 which showed 95% stenosis in the proximal left renal artery with no significant disease affecting the right renal artery.  I performed successful stent placement to the left renal artery .  She lost significant amount of weight over the last year with Ozempic.  She underwent replacement of ascending aorta and aortic valve replacement with pericardial valve in October of this year.  Postoperative course was complicated by bilateral pleural effusions requiring thoracentesis.  Cardiac catheterization before surgery showed minimal nonobstructive coronary artery disease.  She has been doing  reasonably well.  Symptoms of right pleural effusion has improved although she does continue to have mild pleuritic chest pain.  Her lungs are clear today.  Past Medical History:  Diagnosis Date   Ankle fracture 12/31/2018   left   Anxiety    Arthritis    neck, back, ankle, knees   Ascending aortic aneurysm ([place])    a. 04/2016 CTA: 4.6cm Asc TAA; b. 04/2018 CTA: 4.8cm; c. 10/2018 CTA: 4.9x4.8cm; d. 12/2019 Echo: 4.8 cm.   Bicuspid aortic valve    Breast screening, unspecified    Carotid arterial disease ([place])    a. 01/2020 U/S: 1-39% bilat ICA stenosis. No evidence of FMD.   Cervicalgia    cervical stenosis   Diabetes mellitus without complication (HCC)    Elevated coronary artery calcium score    a. 03/2016 Cardiac CT: Ca2+ = 78 (90th %'ile).   Fibromuscular dysplasia (HCC)    Heart murmur    History of hiatal hernia    Metatarsal fracture 2007   5th, from wearing high heels   Moderate aortic stenosis    a. 12/2019 Echo: EF 60-65%, no rwma, mild LVH, gr1 DD, nl RV size/fxn, triv MR, mild AI, mod AS, mod-sev dil of Asc Ao - 3m.  AoV reported as tricuspid but prev known to be bicuspid.   Multiple thyroid nodules    a. 10/2019 U/S: R mid thyroid nodule - f/u 5 yrs.   Other symptoms involving abdomen and pelvis(789.9)    Renal artery stenosis ([place]    a. 12/2019 Renal duplex: Nl RRA. LRA >60% stenosis.   Renal cyst, right    a. 12/2019 Duplex:  5.8 x 5.7 cm R renal cyst in lower pole.   Retinal hemorrhage    Symptomatic menopausal or female climacteric states    Unspecified essential hypertension    Wears contact lenses     Past Surgical History:  Procedure Laterality Date   ABDOMINAL HYSTERECTOMY     ANKLE ARTHROSCOPY WITH RECONSTRUCTION Left 08/24/2020   Procedure: LEFT ANKLE ARTHROSCOPIC DEBRIDEMENT, EXTENSIVE, ARTHOSCROPIC TREATMENT OF TALUS OSTEOCHONDRAL LESION, LATERAL LIGAMENT RECONSTRUCTION;  Surgeon: Erle Crocker, MD;  Location: [facility];  Service:  Orthopedics;  Laterality: Left;   AORTIC VALVE REPLACEMENT N/A 02/14/2022   Procedure: AORTIC VALVE REPLACEMENT (AVR) USING EDWARDS INSPIRIS 23 MM RESILIA AORTIC VALVE;  Surgeon: Gaye Pollack, MD;  Location: [facility];  Service: Open Heart Surgery;  Laterality: N/A;   APPENDECTOMY     done during oophorectomy   BENTALL PROCEDURE N/A 02/14/2022   Procedure: possible BENTALL PROCEDURE;  Surgeon: Gaye Pollack, MD;  Location: [facility];  Service: Open Heart Surgery;  Laterality: N/A;   CESAREAN SECTION     x 2   ETHMOIDECTOMY Left 03/02/2020   Procedure: ETHMOIDECTOMY;  Surgeon: Margaretha Sheffield, MD;  Location: [facility];  Service: ENT;  Laterality: Left;   gyn surgery     hysterectomy- done for adenomysosis and endometriosis   IMAGE GUIDED SINUS SURGERY N/A 03/02/2020   Procedure: IMAGE GUIDED SINUS SURGERY;  Surgeon: Margaretha Sheffield, MD;  Location: [facility];  Service: ENT;  Laterality: N/A;  need disk PLACED DISK ON OR CHARGE NURSE DESK 9-17 KP   IMAGE GUIDED SINUS SURGERY Left 04/13/2020   Procedure: IMAGE GUIDED SINUS SURGERY;  Surgeon: Margaretha Sheffield, MD;  Location: [facility];  Service: ENT;  Laterality: Left;  needs stryker disk per Justice Rocher use same disk as before (October) put on charge nurses desk. DS 04/03/20   IR THORACENTESIS ASP PLEURAL SPACE W/IMG GUIDE  03/08/2022   IR THORACENTESIS ASP PLEURAL SPACE W/IMG GUIDE  04/15/2022   MAXILLARY ANTROSTOMY Left 03/02/2020   Procedure: MAXILLARY ANTROSTOMY;  Surgeon: Margaretha Sheffield, MD;  Location: [facility];  Service: ENT;  Laterality: Left;   MAXILLARY ANTROSTOMY Left 04/13/2020   Procedure: MAXILLECTOMY;  Surgeon: Margaretha Sheffield, MD;  Location: [facility];  Service: ENT;  Laterality: Left;   OOPHORECTOMY     unilateral, due to ruptured ovarian cysts   OSTEOTOMY MAXILLARY     due to bite problems   PERIPHERAL VASCULAR INTERVENTION  03/28/2021   Procedure: PERIPHERAL VASCULAR INTERVENTION;  Surgeon:  Wellington Hampshire, MD;  Location: [facility];  Service: Cardiovascular;;   RENAL ANGIOGRAPHY N/A 03/28/2021   Procedure: RENAL ANGIOGRAPHY;  Surgeon: Wellington Hampshire, MD;  Location: [facility];  Service: Cardiovascular;  Laterality: N/A;   REPLACEMENT ASCENDING AORTA N/A 02/14/2022   Procedure: REPLACEMENT ASCENDING AORTIC ANEURYSM HEMASHIELD PLATINUM 30 MM GRAFT;  Surgeon: Gaye Pollack, MD;  Location: [facility];  Service: Open Heart Surgery;  Laterality: N/A;  CIRC ARREST   RIGHT/LEFT HEART CATH AND CORONARY ANGIOGRAPHY Bilateral 01/28/2022   Procedure: RIGHT/LEFT HEART CATH AND CORONARY ANGIOGRAPHY;  Surgeon: Wellington Hampshire, MD;  Location: [facility];  Service: Cardiovascular;  Laterality: Bilateral;   TEE WITHOUT CARDIOVERSION N/A 02/14/2022   Procedure: TRANSESOPHAGEAL ECHOCARDIOGRAM (TEE);  Surgeon: Gaye Pollack, MD;  Location: [facility];  Service: Open Heart Surgery;  Laterality: N/A;   TUBAL LIGATION       Current Outpatient Medications  Medication Sig  Dispense Refill   acetaminophen (TYLENOL) 325 MG tablet Take 2 tablets (650 mg total) by mouth every 6 (six) hours as needed for mild pain.     ALPRAZolam (XANAX) 0.25 MG tablet Take 1 tablet (0.25 mg total) by mouth 2 (two) times daily as needed. 60 tablet 0   amLODipine (NORVASC) 5 MG tablet Take 1 tablet (5 mg total) by mouth daily. 30 tablet 1   amoxicillin (AMOXIL) 500 MG capsule Take 2,000 mg by mouth See admin instructions. Take 2,000 mg by mouth one hour prior to dental procedures     aspirin EC 81 MG tablet Take 1 tablet (81 mg total) by mouth daily. Swallow whole. 30 tablet 12   blood glucose meter kit and supplies KIT Dispense based on patient and insurance preference. Use up to four times daily as directed. 1 each 0   estradiol (ESTRACE) 0.1 MG/GM vaginal cream INSERT 1/4 APPLICATORFUL VAGINALLY TWO TIMES A WEEK AS DIRECTED (Patient taking differently: Place 7.37 Applicatorfuls vaginally 2 (two) times a  week.) 42.5 g 5   fluticasone (FLONASE) 50 MCG/ACT nasal spray PLACE 2 SPRAYS INTO BOTH NOSTRILS DAILY. 16 g 2   glucose blood test strip Use up to 4 times a day as directed 100 each 0   HYDROcodone-acetaminophen (NORCO) 10-325 MG tablet Take 1 tablet by mouth every 6 (six) hours as needed. 30 tablet 0   Menthol, Topical Analgesic, (BIOFREEZE EX) Apply 1 application  topically 3 (three) times daily as needed (neck pain).     metFORMIN (GLUCOPHAGE-XR) 750 MG 24 hr tablet Take 1 tablet (750 mg total) by mouth daily with breakfast. (Patient taking differently: Take 375-750 mg by mouth See admin instructions. Take 750 mg by mouth in the morning with breakfast as needed for elevated BGL and may decrease dose to 375 mg if BGL is low) 90 tablet 1   metoprolol tartrate (LOPRESSOR) 25 MG tablet Take 1 tablet (25 mg total) by mouth 2 (two) times daily. 60 tablet 1   rosuvastatin (CRESTOR) 5 MG tablet Take 1 tablet (5 mg total) by mouth at bedtime. 90 tablet 2   sodium fluoride (SF 5000 PLUS) 1.1 % CREA dental cream Brush teeth thoroughly for 2 minutes, do not rinse afterwards. 51 g 12   traMADol (ULTRAM) 50 MG tablet Take 2 tablets (100 mg total) by mouth every 6 (six) hours as needed (for pain). 240 tablet 5   Semaglutide,0.25 or 0.5MG/DOS, (OZEMPIC, 0.25 OR 0.5 MG/DOSE,) 2 MG/3ML SOPN Inject into the skin. On hold (Patient not taking: Reported on 04/25/2022)     No current facility-administered medications for this visit.    Allergies:   Hctz [hydrochlorothiazide] and Prozac [fluoxetine hcl]    Social History:  The patient  reports that she has never smoked. She has never used smokeless tobacco. She reports that she does not currently use alcohol. She reports that she does not use drugs.   Family History:  The patient's family history includes Aortic stenosis in her mother; Breast cancer (age of onset: 12) in her maternal aunt; Cancer in her father, granddaughter, and maternal aunt; Cancer (age of onset:  69) in her grandchild; Diabetes in her mother; Melanoma in her son; Retinoblastoma in her son.    ROS:  Please see the history of present illness.   Otherwise, review of systems are positive for none.   All other systems are reviewed and negative.    PHYSICAL EXAM: VS:  BP (!) 150/100 (BP Location: Left Arm, Patient  Position: Sitting, Cuff Size: Normal)   Pulse 69   Ht _0  (1.676 m)   Wt 156 lb 4 oz (70.9 kg)   LMP  (LMP Unknown)   SpO2 98%   BMI 25.22 kg/m  , BMI Body mass index is 25.22 kg/m. GEN: Well nourished, well developed, in no acute distress  HEENT: normal  Neck: no JVD, carotid bruits, or masses Cardiac: RRR; no  rubs, or gallops,no edema . 2/ 6 systolic murmur in the aortic area Respiratory:  clear to auscultation bilaterally, normal work of breathing GI: soft, nontender, nondistended, + BS MS: no deformity or atrophy  Skin: warm and dry, no rash Neuro:  Strength and sensation are intact Psych: euthymic mood, full affect    EKG:  EKG is ordered today. EKG showed normal sinus rhythm with possible left atrial enlargement, LVH with repolarization abnormalities.   Recent Labs: 02/15/2022: Magnesium 2.3 03/07/2022: ALT 51 03/11/2022: BUN 7; Creatinine, Ser 1.00; Hemoglobin 8.9; Platelets 570; Potassium 4.7; Sodium 139    Lipid Panel    Component Value Date/Time   CHOL 132 09/21/2021 1418   CHOL 175 02/17/2013 0746   TRIG 63 09/21/2021 1418   TRIG 100 02/17/2013 0746   HDL 62 09/21/2021 1418   HDL 53 02/17/2013 0746   CHOLHDL 2.1 09/21/2021 1418   CHOLHDL 2 12/18/2020 0941   VLDL 17.0 12/18/2020 0941   VLDL 20 02/17/2013 0746   LDLCALC 57 09/21/2021 1418   LDLCALC 102 (H) 02/17/2013 0746   LDLDIRECT 101.0 11/11/2016 0906      Wt Readings from Last 3 Encounters:  04/25/22 156 lb 4 oz (70.9 kg)  04/16/22 156 lb 6.4 oz (70.9 kg)  04/10/22 159 lb (72.1 kg)           No data to display            ASSESSMENT AND PLAN:  1. Renal artery  stenosis: Status post angioplasty and stent placement to the left renal artery for severe proximal stenosis.  Blood pressure is mildly elevated today.  I requested a follow-up renal artery duplex to be done in March.  We could consider switching metoprolol to carvedilol if needed down the road.  2.  Bicuspid aortic valve with ascending aortic aneurysm.  Status post surgical repair in October.  Significant improvement in dizziness and shortness of breath.    3.  Hyperlipidemia: Currently on rosuvastatin 5 mg once daily.  Most recent lipid profile showed an LDL of 57.  4.  Recurrent right pleural effusion postsurgery: She had thoracentesis twice.  She seems to be symptomatically better and her lungs are relatively clear today.  If she has recurrent pleuritic symptoms, a short course of colchicine can be considered.   Disposition:  Follow-up with me in 6 months.  Signed,  Kathlyn Sacramento, MD  04/25/2022 8:18 AM    [place]

## 2022-04-25 ENCOUNTER — Encounter: Payer: Self-pay | Admitting: Cardiovascular Disease

## 2022-04-25 ENCOUNTER — Ambulatory Visit: Payer: 59 | Attending: Cardiovascular Disease | Admitting: Cardiovascular Disease

## 2022-04-25 VITALS — BP 150/100 | HR 69 | Ht 66.0 in | Wt 156.2 lb

## 2022-04-25 DIAGNOSIS — E785 Hyperlipidemia, unspecified: Secondary | ICD-10-CM | POA: Diagnosis not present

## 2022-04-25 DIAGNOSIS — I701 Atherosclerosis of renal artery: Secondary | ICD-10-CM

## 2022-04-25 DIAGNOSIS — I7121 Aneurysm of the ascending aorta, without rupture: Secondary | ICD-10-CM

## 2022-04-25 DIAGNOSIS — Q231 Congenital insufficiency of aortic valve: Secondary | ICD-10-CM

## 2022-04-25 NOTE — Patient Instructions (Signed)
Medication Instructions:  No changes *If you need a refill on your cardiac medications before your next appointment, please call your pharmacy*   Lab Work: None ordered If you have labs (blood work) drawn today and your tests are completely normal, you will receive your results only by: MyChart Message (if you have MyChart) OR A paper copy in the mail If you have any lab test that is abnormal or we need to change your treatment, we will call you to review the results.   Testing/Procedures: Your physician has requested that you have a renal artery duplex in March 2024. During this test, an ultrasound is used to evaluate blood flow to the kidneys. Take your medications as you usually do. This will take place at [address] [facility] [address], [place] [address].  No food after 11PM the night before.  Water is OK. (Don't drink liquids if you have been instructed not to for ANOTHER test). Avoid foods that produce bowel gas, for 24 hours prior to exam (see below). No breakfast, no chewing gum, no smoking or carbonated beverages. Patient may take morning medications with water. Come in for test at least 15 minutes early to register.    Follow-Up: At [facility], you and your health needs are our priority.  As part of our continuing mission to provide you with exceptional heart care, we have created designated Provider Care Teams.  These Care Teams include your primary Cardiologist (physician) and Advanced Practice Providers (APPs -  Physician Assistants and Nurse Practitioners) who all work together to provide you with the care you need, when you need it.  We recommend signing up for the patient portal called "MyChart".  Sign up information is provided on this After Visit Summary.  MyChart is used to connect with patients for Virtual Visits (Telemedicine).  Patients are able to view lab/test results, encounter notes, upcoming appointments, etc.  Non-urgent messages can  be sent to your provider as well.   To learn more about what you can do with MyChart, go to [email or web address].    Your next appointment:   6 month(s)  The format for your next appointment:   In Person  Provider:   You may see Dr. Kirke Corin or one of the following Advanced Practice Providers on your designated Care Team:   Nicolasa Ducking, NP Eula Listen, PA-C Cadence Fransico Michael, PA-C Charlsie Quest, NP

## 2022-04-29 ENCOUNTER — Other Ambulatory Visit: Payer: Self-pay | Admitting: Surgery

## 2022-04-29 DIAGNOSIS — Z952 Presence of prosthetic heart valve: Secondary | ICD-10-CM

## 2022-04-30 ENCOUNTER — Other Ambulatory Visit: Payer: Self-pay | Admitting: Internal Medicine

## 2022-04-30 ENCOUNTER — Other Ambulatory Visit: Payer: Self-pay

## 2022-05-01 ENCOUNTER — Ambulatory Visit
Admission: RE | Admit: 2022-05-01 | Discharge: 2022-05-01 | Disposition: A | Discharge status: Home / Self Care | Payer: 59 | Source: Ambulatory Visit | Attending: Surgery | Admitting: Surgery

## 2022-05-01 ENCOUNTER — Encounter: Payer: Self-pay | Admitting: Surgery

## 2022-05-01 ENCOUNTER — Ambulatory Visit (INDEPENDENT_AMBULATORY_CARE_PROVIDER_SITE_OTHER): Payer: 59 | Admitting: Surgery

## 2022-05-01 ENCOUNTER — Other Ambulatory Visit: Payer: Self-pay

## 2022-05-01 VITALS — BP 160/82 | HR 68 | Resp 20 | Ht 66.0 in | Wt 158.0 lb

## 2022-05-01 DIAGNOSIS — Z952 Presence of prosthetic heart valve: Secondary | ICD-10-CM

## 2022-05-01 DIAGNOSIS — J90 Pleural effusion, not elsewhere classified: Secondary | ICD-10-CM

## 2022-05-01 MED ORDER — ALPRAZOLAM 0.25 MG PO TABS
0.2500 mg | ORAL_TABLET | Freq: Two times a day (BID) | ORAL | 0 refills | Status: DC | PRN
  Filled 2022-05-01: qty 60, 30d supply, fill #0

## 2022-05-01 NOTE — Telephone Encounter (Signed)
Requesting: Alprazolam 0.25 mg Contract: No UDS: no Last Visit: 04/16/2022 Next Visit: Visit date not found Last Refill: 04/01/2022  Please Advise

## 2022-05-01 NOTE — Progress Notes (Signed)
HPI:  The patient returns today for follow-up status post supra-coronary replacement of the ascending aorta under deep hypothermic circulatory arrest and aortic valve replacement using a 23 mm Edwards Inspiris pericardial valve on 02/14/2022.  She probably had postoperative Dressler syndrome with development of a large right pleural effusion and small pericardial effusion as well as fevers, atrial fibrillation, and feeling generally weak.  She underwent a right thoracentesis on 03/08/2022 while in the hospital removing 1 L of serosanguineous fluid.  She still has some pleural effusion remaining despite diuresis and subsequently had a second right thoracentesis done on 04/15/2022 removing 500 cc of yellow fluid. A 2D echocardiogram showed normal left ventricular systolic function. The aortic valve was functioning normally with no evidence of paravalvular leak. There was a small pericardial effusion over the right ventricle.  Since I last saw her in the office on 04/10/2022 she has continued to progress.  She is ambulating without chest pain or shortness of breath.  She still has occasional cough and mild associated discomfort in her right back.  Her stamina has continued to improve.  She is no longer wheezing with laying in bed.  She denies any fever or chills.  Current Outpatient Medications  Medication Sig Dispense Refill   acetaminophen (TYLENOL) 325 MG tablet Take 2 tablets (650 mg total) by mouth every 6 (six) hours as needed for mild pain.     ALPRAZolam (XANAX) 0.25 MG tablet Take 1 tablet (0.25 mg total) by mouth 2 (two) times daily as needed. 60 tablet 0   amLODipine (NORVASC) 5 MG tablet Take 1 tablet (5 mg total) by mouth daily. 30 tablet 1   amoxicillin (AMOXIL) 500 MG capsule Take 2,000 mg by mouth See admin instructions. Take 2,000 mg by mouth one hour prior to dental procedures     aspirin EC 81 MG tablet Take 1 tablet (81 mg total) by mouth daily. Swallow whole. 30 tablet 12   blood  glucose meter kit and supplies KIT Dispense based on patient and insurance preference. Use up to four times daily as directed. 1 each 0   estradiol (ESTRACE) 0.1 MG/GM vaginal cream INSERT 1/4 APPLICATORFUL VAGINALLY TWO TIMES A WEEK AS DIRECTED (Patient taking differently: Place 7.49 Applicatorfuls vaginally 2 (two) times a week.) 42.5 g 5   fluticasone (FLONASE) 50 MCG/ACT nasal spray PLACE 2 SPRAYS INTO BOTH NOSTRILS DAILY. 16 g 2   glucose blood test strip Use up to 4 times a day as directed 100 each 0   HYDROcodone-acetaminophen (NORCO) 10-325 MG tablet Take 1 tablet by mouth every 6 (six) hours as needed. 30 tablet 0   Menthol, Topical Analgesic, (BIOFREEZE EX) Apply 1 application  topically 3 (three) times daily as needed (neck pain).     metFORMIN (GLUCOPHAGE-XR) 750 MG 24 hr tablet Take 1 tablet (750 mg total) by mouth daily with breakfast. (Patient taking differently: Take 375-750 mg by mouth See admin instructions. Take 750 mg by mouth in the morning with breakfast as needed for elevated BGL and may decrease dose to 375 mg if BGL is low) 90 tablet 1   metoprolol tartrate (LOPRESSOR) 25 MG tablet Take 1 tablet (25 mg total) by mouth 2 (two) times daily. 60 tablet 1   rosuvastatin (CRESTOR) 5 MG tablet Take 1 tablet (5 mg total) by mouth at bedtime. 90 tablet 2   Semaglutide,0.25 or 0.5MG/DOS, (OZEMPIC, 0.25 OR 0.5 MG/DOSE,) 2 MG/3ML SOPN Inject into the skin. On hold (Patient not taking: Reported on  04/25/2022)     sodium fluoride (SF 5000 PLUS) 1.1 % CREA dental cream Brush teeth thoroughly for 2 minutes, do not rinse afterwards. 51 g 12   traMADol (ULTRAM) 50 MG tablet Take 2 tablets (100 mg total) by mouth every 6 (six) hours as needed (for pain). 240 tablet 5   No current facility-administered medications for this visit.     Physical Exam: BP (!) 160/82   Pulse 68   Resp 20   Ht _0  (1.676 m)   Wt 158 lb (71.7 kg)   LMP  (LMP Unknown)   SpO2 97% Comment: RA  BMI 25.50 kg/m   She looks well. Cardiac exam shows a regular rate and rhythm with normal valve sounds.  There is no murmur. Lungs are clear. Chest incision is healing well and the sternum is stable. There is no peripheral edema.  Diagnostic Tests:  Narrative & Impression  CLINICAL DATA:  History of AVR in early October. No current complaints.   EXAM: CHEST - 2 VIEW   COMPARISON:  Chest x-ray dated April 15, 2022.   FINDINGS: The heart remains at the upper limits of normal in size status post AVR. Normal pulmonary vascularity. Unchanged trace right pleural effusion. No consolidation or pneumothorax. No acute osseous abnormality.   IMPRESSION: 1. Unchanged trace right pleural effusion.     Electronically Signed   By: Titus Dubin M.D.   On: 05/01/2022 15:59      Impression:  She is doing well about 2-1/2 months out from her surgery.  Her postoperative Dressler syndrome appears to have resolved.  Her chest x-ray today looks better to me with improved aeration in the right lower lobe.  She may have a trace right pleural effusion that is insignificant.  Her blood pressure is elevated today and she said that it has been elevated at times at home particularly in the afternoon.  She was not sent home on her previous blood pressure regimen due to her blood pressure being on the lower side.  We decided to resume her Coreg at 25 mg twice daily and her Cozaar 100 mg daily and returning the Norvasc to 5 mg daily as needed for high blood pressure episodes.  I think this will probably give her better control over the long-term.  She follows her blood pressure closely at home.  Plan:  She will continue to follow-up with Dr. Derrel Nip and Dr. Fletcher Anon.  I will plan to see her back in 1 year with a CTA of the chest for aortic surveillance.  If her remaining aorta looks good at that time then she would not need another scan for about 5 years.  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   Gaye Pollack, MD [facility] [phone]

## 2022-05-09 ENCOUNTER — Other Ambulatory Visit: Payer: Self-pay | Admitting: Internal Medicine

## 2022-05-09 ENCOUNTER — Other Ambulatory Visit: Payer: Self-pay

## 2022-05-09 DIAGNOSIS — I10 Essential (primary) hypertension: Secondary | ICD-10-CM

## 2022-05-09 MED FILL — Amlodipine Besylate Tab 5 MG (Base Equivalent): ORAL | 30 days supply | Qty: 30 | Fill #0 | Status: AC

## 2022-05-09 MED FILL — Carvedilol Tab 25 MG: ORAL | 90 days supply | Qty: 180 | Fill #0 | Status: AC

## 2022-05-09 MED FILL — Telmisartan Tab 80 MG: ORAL | 90 days supply | Qty: 90 | Fill #0 | Status: AC

## 2022-05-09 NOTE — Telephone Encounter (Signed)
Is it okay to refuse these medications? Two were discontinued by a different provider and the other was refilled on 04/25/2022.

## 2022-05-10 ENCOUNTER — Other Ambulatory Visit: Payer: Self-pay

## 2022-05-24 ENCOUNTER — Other Ambulatory Visit: Payer: Self-pay

## 2022-05-31 ENCOUNTER — Other Ambulatory Visit: Payer: Self-pay | Admitting: Internal Medicine

## 2022-05-31 ENCOUNTER — Other Ambulatory Visit: Payer: Self-pay

## 2022-06-03 ENCOUNTER — Other Ambulatory Visit: Payer: Self-pay

## 2022-06-03 ENCOUNTER — Other Ambulatory Visit: Payer: Self-pay | Admitting: Internal Medicine

## 2022-06-04 ENCOUNTER — Telehealth: Payer: Self-pay | Admitting: Internal Medicine

## 2022-06-04 ENCOUNTER — Other Ambulatory Visit: Payer: Self-pay

## 2022-06-04 MED ORDER — ALPRAZOLAM 0.25 MG PO TABS
0.2500 mg | ORAL_TABLET | Freq: Two times a day (BID) | ORAL | 0 refills | Status: DC | PRN
  Filled 2022-06-04: qty 60, 30d supply, fill #0

## 2022-06-04 NOTE — Telephone Encounter (Signed)
error 

## 2022-06-04 NOTE — Telephone Encounter (Signed)
Requesting: Alprazolam Contract: No UDS: No Last Visit: 04/16/2022 Next Visit: not scheduled Last Refill: 05/01/2022  Please Advise

## 2022-06-04 NOTE — Telephone Encounter (Signed)
Pt need a refill on ALPRAZolam sent to [facility]

## 2022-06-04 NOTE — Addendum Note (Signed)
Addended by: Crecencio Mc on: 06/04/2022 05:07 PM   Modules accepted: Orders

## 2022-07-01 ENCOUNTER — Other Ambulatory Visit: Payer: Commercial Managed Care - PPO

## 2022-07-05 ENCOUNTER — Other Ambulatory Visit: Payer: Self-pay

## 2022-07-05 ENCOUNTER — Other Ambulatory Visit: Payer: Self-pay | Admitting: Internal Medicine

## 2022-07-08 ENCOUNTER — Other Ambulatory Visit (INDEPENDENT_AMBULATORY_CARE_PROVIDER_SITE_OTHER): Payer: 59

## 2022-07-08 ENCOUNTER — Encounter: Payer: Self-pay | Admitting: Internal Medicine

## 2022-07-08 DIAGNOSIS — E1169 Type 2 diabetes mellitus with other specified complication: Secondary | ICD-10-CM | POA: Diagnosis not present

## 2022-07-08 DIAGNOSIS — I10 Essential (primary) hypertension: Secondary | ICD-10-CM

## 2022-07-08 DIAGNOSIS — E782 Mixed hyperlipidemia: Secondary | ICD-10-CM | POA: Diagnosis not present

## 2022-07-08 LAB — COMPREHENSIVE METABOLIC PANEL
ALT: 18 U/L (ref 0–35)
AST: 20 U/L (ref 0–37)
Albumin: 4.1 g/dL (ref 3.5–5.2)
Alkaline Phosphatase: 85 U/L (ref 39–117)
BUN: 25 mg/dL — ABNORMAL HIGH (ref 6–23)
CO2: 28 mEq/L (ref 19–32)
Calcium: 9.2 mg/dL (ref 8.4–10.5)
Chloride: 101 mEq/L (ref 96–112)
Creatinine, Ser: 1.01 mg/dL (ref 0.40–1.20)
GFR: 59.26 mL/min — ABNORMAL LOW (ref >60.00)
Glucose, Bld: 107 mg/dL — ABNORMAL HIGH (ref 70–99)
Potassium: 3.7 mEq/L (ref 3.5–5.1)
Sodium: 139 mEq/L (ref 135–145)
Total Bilirubin: 0.5 mg/dL (ref 0.2–1.2)
Total Protein: 6.8 g/dL (ref 6.0–8.3)

## 2022-07-08 LAB — LIPID PANEL
Cholesterol: 135 mg/dL (ref 0–200)
HDL: 60.4 mg/dL (ref >39.00)
LDL Cholesterol: 60 mg/dL (ref 0–99)
NonHDL: 74.47
Total CHOL/HDL Ratio: 2
Triglycerides: 70 mg/dL (ref 0.0–149.0)
VLDL: 14 mg/dL (ref 0.0–40.0)

## 2022-07-08 LAB — MICROALBUMIN / CREATININE URINE RATIO
Creatinine,U: 130 mg/dL
Microalb Creat Ratio: 7.8 mg/g (ref 0.0–30.0)
Microalb, Ur: 10.2 mg/dL — ABNORMAL HIGH (ref 0.0–1.9)

## 2022-07-08 LAB — LDL CHOLESTEROL, DIRECT: Direct LDL: 62 mg/dL

## 2022-07-08 LAB — HEMOGLOBIN A1C: Hgb A1c MFr Bld: 6.2 % (ref 4.6–6.5)

## 2022-07-09 ENCOUNTER — Other Ambulatory Visit: Payer: Self-pay

## 2022-07-09 MED ORDER — OZEMPIC (0.25 OR 0.5 MG/DOSE) 2 MG/3ML ~~LOC~~ SOPN
0.2500 mg | PEN_INJECTOR | SUBCUTANEOUS | 2 refills | Status: DC
  Filled 2022-07-09: qty 3, 28d supply, fill #0
  Filled 2022-08-29: qty 3, 28d supply, fill #1

## 2022-07-09 NOTE — Assessment & Plan Note (Signed)
Resuming ozempic

## 2022-07-10 ENCOUNTER — Other Ambulatory Visit: Payer: Self-pay | Admitting: Internal Medicine

## 2022-07-10 ENCOUNTER — Other Ambulatory Visit: Payer: Self-pay

## 2022-07-10 MED ORDER — ALPRAZOLAM 0.25 MG PO TABS
0.2500 mg | ORAL_TABLET | Freq: Two times a day (BID) | ORAL | 2 refills | Status: DC | PRN
  Filled 2022-07-10: qty 60, 30d supply, fill #0
  Filled 2022-08-08: qty 60, 30d supply, fill #1
  Filled 2022-09-06: qty 60, 30d supply, fill #2

## 2022-07-15 ENCOUNTER — Ambulatory Visit
Admission: RE | Admit: 2022-07-15 | Discharge: 2022-07-15 | Disposition: A | Discharge status: Home / Self Care | Payer: 59 | Source: Ambulatory Visit | Attending: Internal Medicine | Admitting: Internal Medicine

## 2022-07-15 DIAGNOSIS — Z1231 Encounter for screening mammogram for malignant neoplasm of breast: Secondary | ICD-10-CM | POA: Diagnosis not present

## 2022-07-17 ENCOUNTER — Ambulatory Visit: Payer: 59 | Attending: Cardiovascular Disease

## 2022-07-17 DIAGNOSIS — I701 Atherosclerosis of renal artery: Secondary | ICD-10-CM

## 2022-07-19 ENCOUNTER — Other Ambulatory Visit: Payer: Self-pay | Admitting: *Deleted

## 2022-07-19 DIAGNOSIS — I701 Atherosclerosis of renal artery: Secondary | ICD-10-CM

## 2022-08-02 ENCOUNTER — Other Ambulatory Visit: Payer: Self-pay | Admitting: Internal Medicine

## 2022-08-02 ENCOUNTER — Other Ambulatory Visit: Payer: Self-pay

## 2022-08-02 DIAGNOSIS — J069 Acute upper respiratory infection, unspecified: Secondary | ICD-10-CM

## 2022-08-02 MED FILL — Fluticasone Propionate Nasal Susp 50 MCG/ACT: NASAL | 30 days supply | Qty: 16 | Fill #0 | Status: AC

## 2022-08-04 ENCOUNTER — Other Ambulatory Visit: Payer: Self-pay

## 2022-08-06 ENCOUNTER — Other Ambulatory Visit: Payer: Self-pay

## 2022-08-08 ENCOUNTER — Other Ambulatory Visit: Payer: Self-pay

## 2022-09-06 ENCOUNTER — Other Ambulatory Visit: Payer: Self-pay | Admitting: Internal Medicine

## 2022-09-06 ENCOUNTER — Other Ambulatory Visit: Payer: Self-pay

## 2022-09-06 DIAGNOSIS — J069 Acute upper respiratory infection, unspecified: Secondary | ICD-10-CM

## 2022-09-06 MED FILL — Fluticasone Propionate Nasal Susp 50 MCG/ACT: NASAL | 30 days supply | Qty: 16 | Fill #0 | Status: AC

## 2022-09-06 MED FILL — Carvedilol Tab 25 MG: ORAL | 90 days supply | Qty: 180 | Fill #1 | Status: AC

## 2022-09-06 MED FILL — Telmisartan Tab 80 MG: ORAL | 90 days supply | Qty: 90 | Fill #1 | Status: AC

## 2022-09-16 ENCOUNTER — Other Ambulatory Visit: Payer: Self-pay

## 2022-09-17 ENCOUNTER — Ambulatory Visit: Payer: 59 | Admitting: Internal Medicine

## 2022-09-27 ENCOUNTER — Ambulatory Visit (INDEPENDENT_AMBULATORY_CARE_PROVIDER_SITE_OTHER): Payer: 59

## 2022-09-27 ENCOUNTER — Other Ambulatory Visit: Payer: Self-pay

## 2022-09-27 ENCOUNTER — Ambulatory Visit: Payer: 59 | Admitting: Internal Medicine

## 2022-09-27 VITALS — BP 124/78 | HR 80 | Temp 98.1°F | Ht 66.0 in | Wt 170.8 lb

## 2022-09-27 DIAGNOSIS — E1169 Type 2 diabetes mellitus with other specified complication: Secondary | ICD-10-CM

## 2022-09-27 DIAGNOSIS — J90 Pleural effusion, not elsewhere classified: Secondary | ICD-10-CM

## 2022-09-27 DIAGNOSIS — I10 Essential (primary) hypertension: Secondary | ICD-10-CM

## 2022-09-27 DIAGNOSIS — I479 Paroxysmal tachycardia, unspecified: Secondary | ICD-10-CM | POA: Diagnosis not present

## 2022-09-27 DIAGNOSIS — Z7985 Long-term (current) use of injectable non-insulin antidiabetic drugs: Secondary | ICD-10-CM

## 2022-09-27 DIAGNOSIS — I701 Atherosclerosis of renal artery: Secondary | ICD-10-CM | POA: Diagnosis not present

## 2022-09-27 DIAGNOSIS — D509 Iron deficiency anemia, unspecified: Secondary | ICD-10-CM

## 2022-09-27 DIAGNOSIS — I350 Nonrheumatic aortic (valve) stenosis: Secondary | ICD-10-CM

## 2022-09-27 DIAGNOSIS — R091 Pleurisy: Secondary | ICD-10-CM | POA: Diagnosis not present

## 2022-09-27 DIAGNOSIS — E785 Hyperlipidemia, unspecified: Secondary | ICD-10-CM | POA: Diagnosis not present

## 2022-09-27 DIAGNOSIS — R748 Abnormal levels of other serum enzymes: Secondary | ICD-10-CM | POA: Diagnosis not present

## 2022-09-27 DIAGNOSIS — E782 Mixed hyperlipidemia: Secondary | ICD-10-CM

## 2022-09-27 DIAGNOSIS — E1121 Type 2 diabetes mellitus with diabetic nephropathy: Secondary | ICD-10-CM

## 2022-09-27 DIAGNOSIS — F4322 Adjustment disorder with anxiety: Secondary | ICD-10-CM | POA: Diagnosis not present

## 2022-09-27 LAB — CBC WITH DIFFERENTIAL/PLATELET
Hemoglobin: 12.7 g/dL (ref 11.7–15.5)
Lymphs Abs: 2074 cells/uL (ref 850–3900)
MCHC: 33.1 g/dL (ref 32.0–36.0)
MCV: 83.5 fL (ref 80.0–100.0)

## 2022-09-27 MED ORDER — AMLODIPINE BESYLATE 5 MG PO TABS
5.0000 mg | ORAL_TABLET | Freq: Every day | ORAL | 1 refills | Status: DC
  Filled 2022-09-27 (×2): qty 30, 30d supply, fill #0
  Filled 2022-12-20: qty 30, 30d supply, fill #1

## 2022-09-27 MED ORDER — ESTRADIOL 0.1 MG/GM VA CREA
0.2500 | TOPICAL_CREAM | VAGINAL | 2 refills | Status: DC
  Filled 2022-09-27 (×2): qty 42.5, 90d supply, fill #0
  Filled 2022-12-26: qty 42.5, 90d supply, fill #1
  Filled 2023-04-22: qty 42.5, 90d supply, fill #2

## 2022-09-27 MED ORDER — AMLODIPINE BESYLATE 5 MG PO TABS
5.0000 mg | ORAL_TABLET | Freq: Every day | ORAL | 1 refills | Status: DC
  Filled 2022-09-27: qty 30, 30d supply, fill #0

## 2022-09-27 MED ORDER — PREDNISONE 10 MG PO TABS
ORAL_TABLET | ORAL | 0 refills | Status: DC
  Filled 2022-09-27: qty 21, 6d supply, fill #0

## 2022-09-27 MED ORDER — HYDROCODONE-ACETAMINOPHEN 10-325 MG PO TABS
1.0000 | ORAL_TABLET | Freq: Four times a day (QID) | ORAL | 0 refills | Status: DC | PRN
  Filled 2022-09-27: qty 30, 8d supply, fill #0

## 2022-09-27 MED ORDER — TELMISARTAN 80 MG PO TABS
80.0000 mg | ORAL_TABLET | Freq: Every day | ORAL | 1 refills | Status: DC
Start: 2022-09-27 — End: 2023-05-12
  Filled 2022-09-27 – 2022-12-06 (×2): qty 90, 90d supply, fill #0
  Filled 2023-03-02: qty 90, 90d supply, fill #1

## 2022-09-27 MED ORDER — TRAMADOL HCL 50 MG PO TABS
100.0000 mg | ORAL_TABLET | Freq: Four times a day (QID) | ORAL | 5 refills | Status: DC | PRN
  Filled 2022-09-27 – 2022-10-15 (×2): qty 240, 30d supply, fill #0
  Filled 2022-11-20: qty 240, 30d supply, fill #1
  Filled 2022-12-26: qty 240, 30d supply, fill #2
  Filled 2023-02-10: qty 240, 30d supply, fill #3
  Filled 2023-03-23: qty 240, 30d supply, fill #4

## 2022-09-27 MED ORDER — CARVEDILOL 25 MG PO TABS
25.0000 mg | ORAL_TABLET | Freq: Two times a day (BID) | ORAL | 1 refills | Status: DC
  Filled 2022-09-27: qty 180, fill #0
  Filled 2022-12-06: qty 180, 90d supply, fill #0
  Filled 2023-03-02: qty 180, 90d supply, fill #1

## 2022-09-27 MED ORDER — SEMAGLUTIDE(0.25 OR 0.5MG/DOS) 2 MG/3ML ~~LOC~~ SOPN
0.5000 mg | PEN_INJECTOR | SUBCUTANEOUS | 0 refills | Status: DC
  Filled 2022-09-27: qty 3, 28d supply, fill #0
  Filled 2022-11-08: qty 3, 28d supply, fill #1
  Filled 2022-12-06: qty 3, 28d supply, fill #2

## 2022-09-27 NOTE — Assessment & Plan Note (Addendum)
Resolved by arch 2024 RA doppler.  History of severe left RAS s/p stenting Nov 2022. Repeat evaluation march 24:  normal  no stensis (arida)

## 2022-09-27 NOTE — Assessment & Plan Note (Signed)
Stable. No longer taking proxac.  Continue Xanax 0.25 mg as needed for panic attacks. The risks and benefits of  Chronic  benzodiazepine use were reviewed with patient today including increased risk of dementia,  Addiction, and seizures if abruptly withdrawn  .  Patient was encouraged to reduce use of clonazepam gradually,  Starting with reduction of one dose to 1/2 tablet and use buspirone if needed  .

## 2022-09-27 NOTE — Progress Notes (Unsigned)
Subjective:  Patient ID: Courtney Lopez, female    DOB: [birth year]  Age: 62 y.o. MRN: [id number]  CC: The primary encounter diagnosis was Hyperlipidemia associated with type 2 diabetes mellitus (HCC). Diagnoses of Essential (primary) hypertension, DM type 2 with diabetic mixed hyperlipidemia (HCC), RAS (renal artery stenosis) (HCC), Pleurisy with effusion, Elevated liver enzymes, Iron deficiency anemia, unspecified iron deficiency anemia type, Adjustment disorder with anxious mood, Aortic valve stenosis, etiology of cardiac valve disease unspecified, Primary hypertension, Diabetic nephropathy associated with type 2 diabetes mellitus (HCC), Paroxysmal tachycardia (HCC), and Long-term current use of injectable noninsulin antidiabetic medication were also pertinent to this visit.   HPI Audrielle Reger Walth presents for  Chief Complaint  Patient presents with   Medical Management of Chronic Issues   1) HTN:  TAKING AMLODIPINE 5  prn    taking COREG 25 BID AND TELMISARTAN 80 mg daily . Home readings  have been normal 124/78  ,  by day 3 of back to back shifts work it has been typically higher   up to 140/90   managed with amlodipine ,  but missed her dose last night  2) T2DM:  has had suspension of ozempic since December hospitalization.  Now with 12 lb weight gain   3) right sided pleuristic chest pain still occurring,  but mild,  has been Doing her incentive spirometry  and pulling good volumes  4) s/p aortic valve replacement:  able to run up stairs now,  play with grandchilren  5) chronic neck pain .  Using tramadol  on a daily basis,  and hydrocodone rarely,  just for flares . Last refill 6 moths ago    6) GAD:  alprazolam 4/26 refil   Outpatient Medications Prior to Visit  Medication Sig Dispense Refill   acetaminophen (TYLENOL) 325 MG tablet Take 2 tablets (650 mg total) by mouth every 6 (six) hours as needed for mild pain.     ALPRAZolam (XANAX) 0.25 MG tablet Take 1 tablet  (0.25 mg total) by mouth 2 (two) times daily as needed. 60 tablet 2   amoxicillin (AMOXIL) 500 MG capsule Take 2,000 mg by mouth See admin instructions. Take 2,000 mg by mouth one hour prior to dental procedures     aspirin EC 81 MG tablet Take 1 tablet (81 mg total) by mouth daily. Swallow whole. 30 tablet 12   blood glucose meter kit and supplies KIT Dispense based on patient and insurance preference. Use up to four times daily as directed. 1 each 0   fluticasone (FLONASE) 50 MCG/ACT nasal spray Place 2 sprays into both nostrils daily. 16 g 0   glucose blood test strip Use up to 4 times a day as directed 100 each 0   Menthol, Topical Analgesic, (BIOFREEZE EX) Apply 1 application  topically 3 (three) times daily as needed (neck pain).     metFORMIN (GLUCOPHAGE-XR) 750 MG 24 hr tablet Take 1 tablet (750 mg total) by mouth daily with breakfast. (Patient taking differently: Take 375-750 mg by mouth See admin instructions. Take 750 mg by mouth in the morning with breakfast as needed for elevated BGL and may decrease dose to 375 mg if BGL is low) 90 tablet 1   rosuvastatin (CRESTOR) 5 MG tablet Take 1 tablet (5 mg total) by mouth at bedtime. 90 tablet 2   sodium fluoride (PREVIDENT 5000 PLUS) 1.1 % CREA dental cream Brush teeth thoroughly for 2 minutes, do not rinse afterwards 51 g 12   sodium  fluoride (SF 5000 PLUS) 1.1 % CREA dental cream Brush teeth thoroughly for 2 minutes, do not rinse afterwards. 51 g 12   amLODipine (NORVASC) 5 MG tablet Take 1 tablet (5 mg total) by mouth daily. 30 tablet 1   carvedilol (COREG) 25 MG tablet Take one tablet  by mouth 2 (two) times daily with a meal. 180 tablet 1   estradiol (ESTRACE) 0.1 MG/GM vaginal cream INSERT 1/4 APPLICATORFUL VAGINALLY TWO TIMES A WEEK AS DIRECTED (Patient taking differently: Place 0.25 Applicatorfuls vaginally 2 (two) times a week.) 42.5 g 5   HYDROcodone-acetaminophen (NORCO) 10-325 MG tablet Take 1 tablet by mouth every 6 (six) hours as  needed. 30 tablet 0   Semaglutide,0.25 or 0.5MG /DOS, (OZEMPIC, 0.25 OR 0.5 MG/DOSE,) 2 MG/3ML SOPN Inject 0.25 mg into the skin once a week. 3 mL 2   telmisartan (MICARDIS) 80 MG tablet Take 1 tablet (80 mg total) by mouth daily. 90 tablet 1   traMADol (ULTRAM) 50 MG tablet Take 2 tablets (100 mg total) by mouth every 6 (six) hours as needed (for pain). 240 tablet 5   metoprolol tartrate (LOPRESSOR) 25 MG tablet Take 1 tablet (25 mg total) by mouth 2 (two) times daily. 60 tablet 1   No facility-administered medications prior to visit.    Review of Systems;  Patient denies headache, fevers, malaise, unintentional weight loss, skin rash, eye pain, sinus congestion and sinus pain, sore throat, dysphagia,  hemoptysis , cough, dyspnea, wheezing, chest pain, palpitations, orthopnea, edema, abdominal pain, nausea, melena, diarrhea, constipation, flank pain, dysuria, hematuria, urinary  Frequency, nocturia, numbness, tingling, seizures,  Focal weakness, Loss of consciousness,  Tremor, insomnia, depression, anxiety, and suicidal ideation.      Objective:  BP 124/78   Pulse 80   Temp 98.1 F (36.7 C) (Oral)   Ht 5\' 6"  (1.676 m)   Wt 170 lb 12.8 oz (77.5 kg)   LMP  (LMP Unknown)   SpO2 97%   BMI 27.57 kg/m   BP Readings from Last 3 Encounters:  09/27/22 124/78  05/01/22 (!) 160/82  04/25/22 (!) 150/100    Wt Readings from Last 3 Encounters:  09/27/22 170 lb 12.8 oz (77.5 kg)  05/01/22 158 lb (71.7 kg)  04/25/22 156 lb 4 oz (70.9 kg)    Physical Exam Vitals reviewed.  Constitutional:      General: She is not in acute distress.    Appearance: Normal appearance. She is normal weight. She is not ill-appearing, toxic-appearing or diaphoretic.  HENT:     Head: Normocephalic.  Eyes:     General: No scleral icterus.       Right eye: No discharge.        Left eye: No discharge.     Conjunctiva/sclera: Conjunctivae normal.  Cardiovascular:     Rate and Rhythm: Normal rate and regular  rhythm.     Heart sounds: Normal heart sounds.  Pulmonary:     Effort: Pulmonary effort is normal. No respiratory distress.     Breath sounds: Normal breath sounds.  Chest:       Comments: Well healed surgical scars  Musculoskeletal:        General: Normal range of motion.  Skin:    General: Skin is warm and dry.  Neurological:     General: No focal deficit present.     Mental Status: She is alert and oriented to person, place, and time. Mental status is at baseline.  Psychiatric:  Mood and Affect: Mood normal.        Behavior: Behavior normal.        Thought Content: Thought content normal.        Judgment: Judgment normal.    Lab Results  Component Value Date   HGBA1C 6.2 07/08/2022   HGBA1C 5.4 02/12/2022   HGBA1C 5.8 (H) 09/21/2021    Lab Results  Component Value Date   CREATININE 1.03 09/27/2022   CREATININE 1.01 07/08/2022   CREATININE 1.00 03/11/2022    Lab Results  Component Value Date   WBC 8.9 09/27/2022   HGB 12.7 09/27/2022   HCT 38.4 09/27/2022   PLT 266 09/27/2022   GLUCOSE 117 (H) 09/27/2022   CHOL 135 07/08/2022   TRIG 70.0 07/08/2022   HDL 60.40 07/08/2022   LDLDIRECT 62.0 07/08/2022   LDLCALC 60 07/08/2022   ALT 20 09/27/2022   AST 20 09/27/2022   NA 139 09/27/2022   K 3.9 09/27/2022   CL 102 09/27/2022   CREATININE 1.03 09/27/2022   BUN 30 (H) 09/27/2022   CO2 22 09/27/2022   TSH 3.03 06/29/2020   INR 1.5 (H) 02/14/2022   HGBA1C 6.2 07/08/2022   MICROALBUR 10.2 (H) 07/08/2022    MM 3D SCREEN BREAST BILATERAL  Result Date: 07/17/2022 CLINICAL DATA:  Screening. EXAM: DIGITAL SCREENING BILATERAL MAMMOGRAM WITH TOMOSYNTHESIS AND CAD TECHNIQUE: Bilateral screening digital craniocaudal and mediolateral oblique mammograms were obtained. Bilateral screening digital breast tomosynthesis was performed. The images were evaluated with computer-aided detection. COMPARISON:  Previous exam(s). ACR Breast Density Category b: There are scattered  areas of fibroglandular density. FINDINGS: There are no findings suspicious for malignancy. IMPRESSION: No mammographic evidence of malignancy. A result letter of this screening mammogram will be mailed directly to the patient. RECOMMENDATION: Screening mammogram in one year. (Code:SM-B-01Y) BI-RADS CATEGORY  1: Negative. Electronically Signed   By: Norva Pavlov M.D.   On: 07/17/2022 13:47    Assessment & Plan:  .Hyperlipidemia associated with type 2 diabetes mellitus (HCC)  Essential (primary) hypertension -     Telmisartan; Take 1 tablet (80 mg total) by mouth daily.  Dispense: 90 tablet; Refill: 1  DM type 2 with diabetic mixed hyperlipidemia (HCC)  RAS (renal artery stenosis) (HCC) Assessment & Plan: Resolved by arch 2024 RA doppler.  History of severe left RAS s/p stenting Nov 2022. Repeat evaluation march 24:  normal  no stensis (arida)   Pleurisy with effusion -     DG Chest 2 View; Future  Elevated liver enzymes -     COMPLETE METABOLIC PANEL WITH GFR  Iron deficiency anemia, unspecified iron deficiency anemia type -     CBC With Differential/Platelet -     Iron, TIBC and Ferritin Panel  Adjustment disorder with anxious mood Assessment & Plan: Stable. No longer taking proxac.  Continue Xanax 0.25 mg as needed for panic attacks. The risks and benefits of  Chronic  benzodiazepine use were reviewed with patient today including increased risk of dementia,  Addiction, and seizures if abruptly withdrawn  .  Patient was encouraged to reduce use of clonazepam gradually,  Starting with reduction of one dose to 1/2 tablet and use buspirone if needed  .      Aortic valve stenosis, etiology of cardiac valve disease unspecified  Primary hypertension Assessment & Plan: Readings at home have been < 130/80 on carvedilol and telmisartan max doses.  Uses amlodipine 5 mg prn SBP > 140   Diabetic nephropathy associated with type 2  diabetes mellitus (HCC) Assessment & Plan: Excellent  glycemic control with  metformin XR and recent addition of  Ozempic.  Continue ARB and statin    Lab Results  Component Value Date   HGBA1C 6.2 07/08/2022   Lab Results  Component Value Date   MICROALBUR 10.2 (H) 07/08/2022   MICROALBUR 6.2 (H) 12/18/2020        Paroxysmal tachycardia (HCC) Assessment & Plan: Continue carvedilol    Long-term current use of injectable noninsulin antidiabetic medication Assessment & Plan: Advised to resume  ozempic  given 12 lb weight gain since suspending it.    Other orders -     Carvedilol; Take 1 tablet (25 mg total) by mouth 2 (two) times daily with a meal.  Dispense: 180 tablet; Refill: 1 -     Semaglutide(0.25 or 0.5MG /DOS); Inject 0.5 mg into the skin once a week.  Dispense: 9 mL; Refill: 0 -     predniSONE; Take 6 tablets by mouth on Day 1 , then reduce by 1 tablet daily until gone  Dispense: 21 tablet; Refill: 0 -     traMADol HCl; Take 2 tablets (100 mg total) by mouth every 6 (six) hours as needed (for pain).  Dispense: 240 tablet; Refill: 5 -     amLODIPine Besylate; Take 1 tablet (5 mg total) by mouth daily.  Dispense: 30 tablet; Refill: 1 -     HYDROcodone-Acetaminophen; Take 1 tablet by mouth every 6 (six) hours as needed.  Dispense: 30 tablet; Refill: 0 -     Estradiol; Place 0.25 Applicatorfuls vaginally 2 (two) times a week.  Dispense: 42.5 g; Refill: 2     I provided 37 minutes of face-to-face time during this encounter reviewing patient's last visit with me, patient's  most recent visit with cardiology,  nephrology,  and neurology,  recent surgical and non surgical procedures, previous  labs and imaging studies, counseling on currently addressed issues,  and post visit ordering to diagnostics and therapeutics .   Follow-up: Return in about 3 months (around 12/28/2022) for follow up diabetes.   Sherlene Shams, MD

## 2022-09-27 NOTE — Assessment & Plan Note (Signed)
Readings at home have been < 130/80 on carvedilol and telmisartan max doses.  Uses amlodipine 5 mg prn SBP > 140

## 2022-09-27 NOTE — Patient Instructions (Addendum)
Ozempic has been refilled at the  0.5 mg weekly dose  The dose can be increased to 1.0 mg after 4 weeks   Let me know if you would like to try adding a low dose of gabapentin for the chest  wall pain

## 2022-09-28 DIAGNOSIS — Z7985 Long-term (current) use of injectable non-insulin antidiabetic drugs: Secondary | ICD-10-CM | POA: Insufficient documentation

## 2022-09-28 LAB — COMPLETE METABOLIC PANEL WITH GFR
AG Ratio: 1.7 (calc) (ref 1.0–2.5)
ALT: 20 U/L (ref 6–29)
AST: 20 U/L (ref 10–35)
Albumin: 4.2 g/dL (ref 3.6–5.1)
Alkaline phosphatase (APISO): 108 U/L (ref 37–153)
BUN/Creatinine Ratio: 29 (calc) — ABNORMAL HIGH (ref 6–22)
BUN: 30 mg/dL — ABNORMAL HIGH (ref 7–25)
CO2: 22 mmol/L (ref 20–32)
Calcium: 9.1 mg/dL (ref 8.6–10.4)
Chloride: 102 mmol/L (ref 98–110)
Creat: 1.03 mg/dL (ref 0.50–1.05)
Globulin: 2.5 g/dL (calc) (ref 1.9–3.7)
Glucose, Bld: 117 mg/dL — ABNORMAL HIGH (ref 65–99)
Potassium: 3.9 mmol/L (ref 3.5–5.3)
Sodium: 139 mmol/L (ref 135–146)
Total Bilirubin: 0.4 mg/dL (ref 0.2–1.2)
Total Protein: 6.7 g/dL (ref 6.1–8.1)
eGFR: 61 mL/min/{1.73_m2} (ref >=60)

## 2022-09-28 LAB — CBC WITH DIFFERENTIAL/PLATELET
Absolute Monocytes: 828 cells/uL (ref 200–950)
Basophils Absolute: 62 cells/uL (ref 0–200)
Basophils Relative: 0.7 %
Eosinophils Absolute: 187 cells/uL (ref 15–500)
Eosinophils Relative: 2.1 %
HCT: 38.4 % (ref 35.0–45.0)
MCH: 27.6 pg (ref 27.0–33.0)
MPV: 10.8 fL (ref 7.5–12.5)
Monocytes Relative: 9.3 %
Neutro Abs: 5749 cells/uL (ref 1500–7800)
Neutrophils Relative %: 64.6 %
Platelets: 266 10*3/uL (ref 140–400)
RBC: 4.6 10*6/uL (ref 3.80–5.10)
RDW: 13.5 % (ref 11.0–15.0)
Total Lymphocyte: 23.3 %
WBC: 8.9 10*3/uL (ref 3.8–10.8)

## 2022-09-28 LAB — IRON,TIBC AND FERRITIN PANEL
%SAT: 9 % (calc) — ABNORMAL LOW (ref 16–45)
Ferritin: 52 ng/mL (ref 16–288)
Iron: 33 ug/dL — ABNORMAL LOW (ref 45–160)
TIBC: 353 mcg/dL (calc) (ref 250–450)

## 2022-09-28 NOTE — Assessment & Plan Note (Addendum)
Advised to resume  ozempic  given 12 lb weight gain since suspending it.

## 2022-09-28 NOTE — Assessment & Plan Note (Signed)
Excellent glycemic control with  metformin XR and recent addition of  Ozempic.  Continue ARB and statin    Lab Results  Component Value Date   HGBA1C 6.2 07/08/2022   Lab Results  Component Value Date   MICROALBUR 10.2 (H) 07/08/2022   MICROALBUR 6.2 (H) 12/18/2020

## 2022-09-28 NOTE — Assessment & Plan Note (Signed)
Continue carvedilol 

## 2022-09-30 ENCOUNTER — Other Ambulatory Visit: Payer: Self-pay

## 2022-10-04 ENCOUNTER — Other Ambulatory Visit: Payer: Self-pay | Admitting: Internal Medicine

## 2022-10-08 ENCOUNTER — Other Ambulatory Visit: Payer: Self-pay

## 2022-10-08 ENCOUNTER — Encounter: Payer: Self-pay | Admitting: Internal Medicine

## 2022-10-08 MED ORDER — ALPRAZOLAM 0.25 MG PO TABS
0.2500 mg | ORAL_TABLET | Freq: Two times a day (BID) | ORAL | 5 refills | Status: DC | PRN
  Filled 2022-10-08: qty 60, 30d supply, fill #0
  Filled 2022-11-08: qty 60, 30d supply, fill #1
  Filled 2022-12-06: qty 60, 30d supply, fill #2
  Filled 2023-01-08: qty 60, 30d supply, fill #3
  Filled 2023-02-10: qty 60, 30d supply, fill #4
  Filled 2023-03-23: qty 60, 30d supply, fill #5

## 2022-10-15 ENCOUNTER — Other Ambulatory Visit: Payer: Self-pay | Admitting: Internal Medicine

## 2022-10-15 ENCOUNTER — Other Ambulatory Visit: Payer: Self-pay

## 2022-10-15 DIAGNOSIS — J069 Acute upper respiratory infection, unspecified: Secondary | ICD-10-CM

## 2022-10-16 ENCOUNTER — Other Ambulatory Visit: Payer: Self-pay

## 2022-10-16 MED ORDER — FLUTICASONE PROPIONATE 50 MCG/ACT NA SUSP
2.0000 | Freq: Every day | NASAL | 3 refills | Status: DC
Start: 2022-10-16 — End: 2023-06-13
  Filled 2022-10-16: qty 16, 30d supply, fill #0
  Filled 2022-12-20: qty 16, 30d supply, fill #1
  Filled 2023-02-17 – 2023-03-24 (×2): qty 16, 30d supply, fill #2
  Filled 2023-04-22: qty 16, 30d supply, fill #3

## 2022-10-17 ENCOUNTER — Other Ambulatory Visit: Payer: Self-pay

## 2022-11-07 NOTE — Progress Notes (Signed)
Cardiology Office Note   Date:  11/08/2022   ID:  Courtney Lopez, DOB 09-04-1960, MRN [id number]  PCP:  Sherlene Shams, MD  Cardiologist:  Dr. Mariah Milling  Chief Complaint  Patient presents with   Follow-up    6 month f/u no complaints today. Meds reviewed verbally with pt.       History of Present Illness: Courtney Lopez is a 62 y.o. female who is here today for a follow-up visit regarding renal artery stenosis.  She has known history of bicuspid aortic valve with ascending aortic aneurysm, renal artery stenosis, hyperlipidemia and type 2 diabetes. She had CTA of the aorta in 2020 which showed evidence of significant left renal artery stenosis with lack of significant calcifications. This was suggestive of fibromuscular dysplasia. Left renal artery stenosis was confirmed with renal artery duplex. Repeat renal artery duplex in 2021 showed significant stenosis in the mid left renal artery with peak velocity of 439.  Carotid Doppler showed no significant disease.   Blood pressure was initially controlled with medications with stable renal function and thus renal artery angiography was not pursued.   However, she had worsening renal function and blood pressure control in late 2022. I proceeded with angiography in November 2022 which showed 95% stenosis in the proximal left renal artery with no significant disease affecting the right renal artery.  I performed successful stent placement to the left renal artery .  She underwent replacement of ascending aorta and aortic valve replacement with pericardial valve in October of 2023.  Postoperative course was complicated by bilateral pleural effusions requiring thoracentesis.  Cardiac catheterization before surgery showed minimal nonobstructive coronary artery disease.  She has been doing extremely well with no chest pain or shortness of breath.  She had 1 episode of dizziness but overall she feels much better than before and has  resumed work.  Her blood pressure is overall controlled but she has occasional spikes.  Past Medical History:  Diagnosis Date   Ankle fracture 12/31/2018   left   Anxiety    Arthritis    neck, back, ankle, knees   Ascending aortic aneurysm (HCC)    a. 04/2016 CTA: 4.6cm Asc TAA; b. 04/2018 CTA: 4.8cm; c. 10/2018 CTA: 4.9x4.8cm; d. 12/2019 Echo: 4.8 cm.   Bicuspid aortic valve    Breast screening, unspecified    Carotid arterial disease (HCC)    a. 01/2020 U/S: 1-39% bilat ICA stenosis. No evidence of FMD.   Cervicalgia    cervical stenosis   Diabetes mellitus without complication (HCC)    Elevated coronary artery calcium score    a. 03/2016 Cardiac CT: Ca2+ = 78 (90th %'ile).   Fibromuscular dysplasia (HCC)    Heart murmur    History of hiatal hernia    Metatarsal fracture 2007   5th, from wearing high heels   Moderate aortic stenosis    a. 12/2019 Echo: EF 60-65%, no rwma, mild LVH, gr1 DD, nl RV size/fxn, triv MR, mild AI, mod AS, mod-sev dil of Asc Ao - 48mm.  AoV reported as tricuspid but prev known to be bicuspid.   Multiple thyroid nodules    a. 10/2019 U/S: R mid thyroid nodule - f/u 5 yrs.   Other symptoms involving abdomen and pelvis(789.9)    Renal artery stenosis (HCC)    a. 12/2019 Renal duplex: Nl RRA. LRA >60% stenosis.   Renal cyst, right    a. 12/2019 Duplex: 5.8 x 5.7 cm R renal cyst in lower  pole.   Retinal hemorrhage    Symptomatic menopausal or female climacteric states    Unspecified essential hypertension    Wears contact lenses     Past Surgical History:  Procedure Laterality Date   ABDOMINAL HYSTERECTOMY     ANKLE ARTHROSCOPY WITH RECONSTRUCTION Left 08/24/2020   Procedure: LEFT ANKLE ARTHROSCOPIC DEBRIDEMENT, EXTENSIVE, ARTHOSCROPIC TREATMENT OF TALUS OSTEOCHONDRAL LESION, LATERAL LIGAMENT RECONSTRUCTION;  Surgeon: Terance Hart, MD;  Location: [facility];  Service: Orthopedics;  Laterality: Left;   AORTIC VALVE REPLACEMENT N/A  02/14/2022   Procedure: AORTIC VALVE REPLACEMENT (AVR) USING EDWARDS INSPIRIS 23 MM RESILIA AORTIC VALVE;  Surgeon: Alleen Borne, MD;  Location: [facility];  Service: Open Heart Surgery;  Laterality: N/A;   APPENDECTOMY     done during oophorectomy   BENTALL PROCEDURE N/A 02/14/2022   Procedure: possible BENTALL PROCEDURE;  Surgeon: Alleen Borne, MD;  Location: [facility];  Service: Open Heart Surgery;  Laterality: N/A;   CESAREAN SECTION     x 2   ETHMOIDECTOMY Left 03/02/2020   Procedure: ETHMOIDECTOMY;  Surgeon: Vernie Murders, MD;  Location: [facility];  Service: ENT;  Laterality: Left;   gyn surgery     hysterectomy- done for adenomysosis and endometriosis   IMAGE GUIDED SINUS SURGERY N/A 03/02/2020   Procedure: IMAGE GUIDED SINUS SURGERY;  Surgeon: Vernie Murders, MD;  Location: [facility];  Service: ENT;  Laterality: N/A;  need disk PLACED DISK ON OR CHARGE NURSE DESK 9-17 KP   IMAGE GUIDED SINUS SURGERY Left 04/13/2020   Procedure: IMAGE GUIDED SINUS SURGERY;  Surgeon: Vernie Murders, MD;  Location: [facility];  Service: ENT;  Laterality: Left;  needs stryker disk per Gloriajean Dell use same disk as before (October) put on charge nurses desk. DS 04/03/20   IR THORACENTESIS ASP PLEURAL SPACE W/IMG GUIDE  03/08/2022   IR THORACENTESIS ASP PLEURAL SPACE W/IMG GUIDE  04/15/2022   MAXILLARY ANTROSTOMY Left 03/02/2020   Procedure: MAXILLARY ANTROSTOMY;  Surgeon: Vernie Murders, MD;  Location: [facility];  Service: ENT;  Laterality: Left;   MAXILLARY ANTROSTOMY Left 04/13/2020   Procedure: MAXILLECTOMY;  Surgeon: Vernie Murders, MD;  Location: [facility];  Service: ENT;  Laterality: Left;   OOPHORECTOMY     unilateral, due to ruptured ovarian cysts   OSTEOTOMY MAXILLARY     due to bite problems   PERIPHERAL VASCULAR INTERVENTION  03/28/2021   Procedure: PERIPHERAL VASCULAR INTERVENTION;  Surgeon: Iran Ouch, MD;  [facility]: [facility];  Service:  Cardiovascular;;   RENAL ANGIOGRAPHY N/A 03/28/2021   Procedure: RENAL ANGIOGRAPHY;  Surgeon: Iran Ouch, MD;  [facility]: [facility];  Service: Cardiovascular;  Laterality: N/A;   REPLACEMENT ASCENDING AORTA N/A 02/14/2022   Procedure: REPLACEMENT ASCENDING AORTIC ANEURYSM HEMASHIELD PLATINUM 30 MM GRAFT;  Surgeon: Alleen Borne, MD;  Location: [facility];  Service: Open Heart Surgery;  Laterality: N/A;  CIRC ARREST   RIGHT/LEFT HEART CATH AND CORONARY ANGIOGRAPHY Bilateral 01/28/2022   Procedure: RIGHT/LEFT HEART CATH AND CORONARY ANGIOGRAPHY;  Surgeon: Iran Ouch, MD;  Location: [facility] CV [facility];  Service: Cardiovascular;  Laterality: Bilateral;   TEE WITHOUT CARDIOVERSION N/A 02/14/2022   Procedure: TRANSESOPHAGEAL ECHOCARDIOGRAM (TEE);  Surgeon: Alleen Borne, MD;  Location: [facility] OR;  Service: Open Heart Surgery;  Laterality: N/A;   TUBAL LIGATION       Current Outpatient Medications  Medication Sig Dispense Refill   acetaminophen (TYLENOL) 325 MG tablet  Take 2 tablets (650 mg total) by mouth every 6 (six) hours as needed for mild pain.     ALPRAZolam (XANAX) 0.25 MG tablet Take 1 tablet (0.25 mg total) by mouth 2 (two) times daily as needed. 60 tablet 5   amLODipine (NORVASC) 5 MG tablet Take 1 tablet (5 mg total) by mouth daily. 30 tablet 1   amoxicillin (AMOXIL) 500 MG capsule Take 2,000 mg by mouth See admin instructions. Take 2,000 mg by mouth one hour prior to dental procedures     aspirin EC 81 MG tablet Take 1 tablet (81 mg total) by mouth daily. Swallow whole. 30 tablet 12   blood glucose meter kit and supplies KIT Dispense based on patient and insurance preference. Use up to four times daily as directed. 1 each 0   carvedilol (COREG) 25 MG tablet Take 1 tablet (25 mg total) by mouth 2 (two) times daily with a meal. 180 tablet 1   estradiol (ESTRACE) 0.1 MG/GM vaginal cream Place 0.25 Applicatorfuls vaginally 2 (two) times a week. 42.5 g 2   fluticasone  (FLONASE) 50 MCG/ACT nasal spray Place 2 sprays into both nostrils daily. 16 g 3   glucose blood test strip Use up to 4 times a day as directed 100 each 0   HYDROcodone-acetaminophen (NORCO) 10-325 MG tablet Take 1 tablet by mouth every 6 (six) hours as needed. 30 tablet 0   Menthol, Topical Analgesic, (BIOFREEZE EX) Apply 1 application  topically 3 (three) times daily as needed (neck pain).     metFORMIN (GLUCOPHAGE-XR) 750 MG 24 hr tablet Take 1 tablet (750 mg total) by mouth daily with breakfast. (Patient taking differently: Take 375-750 mg by mouth See admin instructions. Take 750 mg by mouth in the morning with breakfast as needed for elevated BGL and may decrease dose to 375 mg if BGL is low) 90 tablet 1   rosuvastatin (CRESTOR) 5 MG tablet Take 1 tablet (5 mg total) by mouth at bedtime. 90 tablet 2   Semaglutide,0.25 or 0.5MG /DOS, 2 MG/3ML SOPN Inject 0.5 mg into the skin once a week. 9 mL 0   sodium fluoride (PREVIDENT 5000 PLUS) 1.1 % CREA dental cream Brush teeth thoroughly for 2 minutes, do not rinse afterwards 51 g 12   sodium fluoride (SF 5000 PLUS) 1.1 % CREA dental cream Brush teeth thoroughly for 2 minutes, do not rinse afterwards. 51 g 12   telmisartan (MICARDIS) 80 MG tablet Take 1 tablet (80 mg total) by mouth daily. 90 tablet 1   traMADol (ULTRAM) 50 MG tablet Take 2 tablets (100 mg total) by mouth every 6 (six) hours as needed (for pain). 240 tablet 5   No current facility-administered medications for this visit.    Allergies:   Hctz [hydrochlorothiazide] and Prozac [fluoxetine hcl]    Social History:  The patient  reports that she has never smoked. She has never used smokeless tobacco. She reports that she does not currently use alcohol. She reports that she does not use drugs.   Family History:  The patient's family history includes Aortic stenosis in her mother; Breast cancer (age of onset: 42) in her maternal aunt; Cancer in her father, granddaughter, and maternal aunt;  Cancer (age of onset: 1) in her grandchild; Diabetes in her mother; Melanoma in her son; Retinoblastoma in her son.    ROS:  Please see the history of present illness.   Otherwise, review of systems are positive for none.   All other systems are reviewed  and negative.    PHYSICAL EXAM: VS:  BP (!) 140/98 (BP Location: Left Arm, Patient Position: Sitting, Cuff Size: Normal)   Pulse 75   Ht 5\' 6"  (1.676 m)   Wt 166 lb (75.3 kg)   LMP  (LMP Unknown)   SpO2 99%   BMI 26.79 kg/m  , BMI Body mass index is 26.79 kg/m. GEN: Well nourished, well developed, in no acute distress  HEENT: normal  Neck: no JVD, carotid bruits, or masses Cardiac: RRR; no  rubs, or gallops,no edema . 2/ 6 systolic murmur in the aortic area Respiratory:  clear to auscultation bilaterally, normal work of breathing GI: soft, nontender, nondistended, + BS MS: no deformity or atrophy  Skin: warm and dry, no rash Neuro:  Strength and sensation are intact Psych: euthymic mood, full affect    EKG:  EKG is ordered today. EKG showed normal sinus rhythm with possible left atrial enlargement, LVH with repolarization abnormalities.   Recent Labs: 02/15/2022: Magnesium 2.3 09/27/2022: ALT 20; BUN 30; Creat 1.03; Hemoglobin 12.7; Platelets 266; Potassium 3.9; Sodium 139    Lipid Panel    Component Value Date/Time   CHOL 135 07/08/2022 0844   CHOL 132 09/21/2021 1418   CHOL 175 02/17/2013 0746   TRIG 70.0 07/08/2022 0844   TRIG 100 02/17/2013 0746   HDL 60.40 07/08/2022 0844   HDL 62 09/21/2021 1418   HDL 53 02/17/2013 0746   CHOLHDL 2 07/08/2022 0844   VLDL 14.0 07/08/2022 0844   VLDL 20 02/17/2013 0746   LDLCALC 60 07/08/2022 0844   LDLCALC 57 09/21/2021 1418   LDLCALC 102 (H) 02/17/2013 0746   LDLDIRECT 62.0 07/08/2022 0844      Wt Readings from Last 3 Encounters:  11/08/22 166 lb (75.3 kg)  09/27/22 170 lb 12.8 oz (77.5 kg)  05/01/22 158 lb (71.7 kg)           No data to display             ASSESSMENT AND PLAN:  1. Renal artery stenosis: Status post angioplasty and stent placement to the left renal artery for severe proximal stenosis.  Most recent Doppler in March showed patent stent.  Repeat study next year. She has spikes of elevated blood pressure and she takes amlodipine as needed.  On average she takes this once or twice per week.  I asked her to continue monitoring her blood pressure we could consider adding amlodipine 2.5 mg once daily on a regular basis if her blood pressure is consistently high.  2.  Bicuspid aortic valve with ascending aortic aneurysm.  Status post surgical repair in October.  Significant improvement in dizziness and shortness of breath.  Will get a follow-up echocardiogram in 2 to 3 years.  3.  Hyperlipidemia: Currently on rosuvastatin 5 mg once daily.  Most recent lipid profile showed an LDL of 57.  4.  Recurrent right pleural effusion postsurgery: This has almost completely resolved.   Disposition:  Follow-up with me in 6 months.  Signed,  Lorine Bears, MD  11/08/2022 8:02 AM    [facility]

## 2022-11-08 ENCOUNTER — Encounter: Payer: Self-pay | Admitting: Cardiovascular Disease

## 2022-11-08 ENCOUNTER — Other Ambulatory Visit: Payer: Self-pay

## 2022-11-08 ENCOUNTER — Ambulatory Visit: Payer: 59 | Attending: Cardiovascular Disease | Admitting: Cardiovascular Disease

## 2022-11-08 VITALS — BP 140/98 | HR 75 | Ht 66.0 in | Wt 166.0 lb

## 2022-11-08 DIAGNOSIS — I701 Atherosclerosis of renal artery: Secondary | ICD-10-CM | POA: Diagnosis not present

## 2022-11-08 DIAGNOSIS — I10 Essential (primary) hypertension: Secondary | ICD-10-CM

## 2022-11-08 DIAGNOSIS — I7121 Aneurysm of the ascending aorta, without rupture: Secondary | ICD-10-CM | POA: Diagnosis not present

## 2022-11-08 DIAGNOSIS — E785 Hyperlipidemia, unspecified: Secondary | ICD-10-CM | POA: Diagnosis not present

## 2022-11-08 DIAGNOSIS — I359 Nonrheumatic aortic valve disorder, unspecified: Secondary | ICD-10-CM | POA: Diagnosis not present

## 2022-11-08 NOTE — Patient Instructions (Signed)
Medication Instructions:  No changes *If you need a refill on your cardiac medications before your next appointment, please call your pharmacy*   Lab Work: None ordered If you have labs (blood work) drawn today and your tests are completely normal, you will receive your results only by: MyChart Message (if you have MyChart) OR A paper copy in the mail If you have any lab test that is abnormal or we need to change your treatment, we will call you to review the results.   Testing/Procedures: None ordered   Follow-Up: At [facility], you and your health needs are our priority.  As part of our continuing mission to provide you with exceptional heart care, we have created designated Provider Care Teams.  These Care Teams include your primary Cardiologist (physician) and Advanced Practice Providers (APPs -  Physician Assistants and Nurse Practitioners) who all work together to provide you with the care you need, when you need it.  We recommend signing up for the patient portal called "MyChart".  Sign up information is provided on this After Visit Summary.  MyChart is used to connect with patients for Virtual Visits (Telemedicine).  Patients are able to view lab/test results, encounter notes, upcoming appointments, etc.  Non-urgent messages can be sent to your provider as well.   To learn more about what you can do with MyChart, go to [email or web address].    Your next appointment:   6 month(s)  Provider:   You may see Dr. Arida or one of the following Advanced Practice Providers on your designated Care Team:   Christopher Berge, NP Ryan Dunn, PA-C Cadence Furth, PA-C Sheri Hammock, NP    

## 2022-11-21 ENCOUNTER — Other Ambulatory Visit: Payer: Self-pay

## 2022-11-29 ENCOUNTER — Other Ambulatory Visit: Payer: Self-pay | Admitting: Internal Medicine

## 2022-11-29 ENCOUNTER — Other Ambulatory Visit: Payer: Self-pay

## 2022-11-29 MED ORDER — METFORMIN HCL ER 750 MG PO TB24
750.0000 mg | ORAL_TABLET | Freq: Every day | ORAL | 1 refills | Status: DC
  Filled 2022-11-29: qty 90, 90d supply, fill #0
  Filled 2023-03-02: qty 90, 90d supply, fill #1

## 2022-12-06 ENCOUNTER — Other Ambulatory Visit: Payer: Self-pay

## 2022-12-11 ENCOUNTER — Encounter (INDEPENDENT_AMBULATORY_CARE_PROVIDER_SITE_OTHER): Payer: Self-pay

## 2022-12-12 ENCOUNTER — Other Ambulatory Visit: Payer: Self-pay

## 2022-12-12 ENCOUNTER — Telehealth: Payer: 59 | Admitting: Physician Assistant

## 2022-12-12 DIAGNOSIS — B9689 Other specified bacterial agents as the cause of diseases classified elsewhere: Secondary | ICD-10-CM

## 2022-12-12 DIAGNOSIS — J208 Acute bronchitis due to other specified organisms: Secondary | ICD-10-CM

## 2022-12-12 MED ORDER — DOXYCYCLINE HYCLATE 100 MG PO TABS
100.0000 mg | ORAL_TABLET | Freq: Two times a day (BID) | ORAL | 0 refills | Status: DC
Start: 2022-12-12 — End: 2022-12-30
  Filled 2022-12-12: qty 14, 7d supply, fill #0

## 2022-12-12 MED ORDER — BENZONATATE 100 MG PO CAPS
100.0000 mg | ORAL_CAPSULE | Freq: Three times a day (TID) | ORAL | 0 refills | Status: DC | PRN
Start: 2022-12-12 — End: 2022-12-30
  Filled 2022-12-12: qty 30, 10d supply, fill #0

## 2022-12-12 NOTE — Patient Instructions (Signed)
Courtney Lopez, thank you for joining Piedad Climes, PA-C for today's virtual visit.  While this provider is not your primary care provider (PCP), if your PCP is located in our provider database this encounter information will be shared with them immediately following your visit.   A [facility] MyChart account gives you access to today's visit and all your visits, tests, and labs performed at [facility] " click here if you don't have a [facility] MyChart account or go to [email or web address]  Consent: (Patient) Courtney Lopez provided verbal consent for this virtual visit at the beginning of the encounter.  Current Medications:  Current Outpatient Medications:    acetaminophen (TYLENOL) 325 MG tablet, Take 2 tablets (650 mg total) by mouth every 6 (six) hours as needed for mild pain., Disp: , Rfl:    ALPRAZolam (XANAX) 0.25 MG tablet, Take 1 tablet (0.25 mg total) by mouth 2 (two) times daily as needed., Disp: 60 tablet, Rfl: 5   amLODipine (NORVASC) 5 MG tablet, Take 1 tablet (5 mg total) by mouth daily., Disp: 30 tablet, Rfl: 1   amoxicillin (AMOXIL) 500 MG capsule, Take 2,000 mg by mouth See admin instructions. Take 2,000 mg by mouth one hour prior to dental procedures, Disp: , Rfl:    aspirin EC 81 MG tablet, Take 1 tablet (81 mg total) by mouth daily. Swallow whole., Disp: 30 tablet, Rfl: 12   blood glucose meter kit and supplies KIT, Dispense based on patient and insurance preference. Use up to four times daily as directed., Disp: 1 each, Rfl: 0   carvedilol (COREG) 25 MG tablet, Take 1 tablet (25 mg total) by mouth 2 (two) times daily with a meal., Disp: 180 tablet, Rfl: 1   estradiol (ESTRACE) 0.1 MG/GM vaginal cream, Place 0.25 Applicatorfuls vaginally 2 (two) times a week., Disp: 42.5 g, Rfl: 2   fluticasone (FLONASE) 50 MCG/ACT nasal spray, Place 2 sprays into both nostrils daily., Disp: 16 g, Rfl: 3   glucose blood test strip, Use up to 4 times a  day as directed, Disp: 100 each, Rfl: 0   HYDROcodone-acetaminophen (NORCO) 10-325 MG tablet, Take 1 tablet by mouth every 6 (six) hours as needed., Disp: 30 tablet, Rfl: 0   Menthol, Topical Analgesic, (BIOFREEZE EX), Apply 1 application  topically 3 (three) times daily as needed (neck pain)., Disp: , Rfl:    metFORMIN (GLUCOPHAGE-XR) 750 MG 24 hr tablet, Take 1 tablet (750 mg total) by mouth daily with breakfast., Disp: 90 tablet, Rfl: 1   rosuvastatin (CRESTOR) 5 MG tablet, Take 1 tablet (5 mg total) by mouth at bedtime., Disp: 90 tablet, Rfl: 2   Semaglutide,0.25 or 0.5MG /DOS, 2 MG/3ML SOPN, Inject 0.5 mg into the skin once a week., Disp: 9 mL, Rfl: 0   sodium fluoride (PREVIDENT 5000 PLUS) 1.1 % CREA dental cream, Brush teeth thoroughly for 2 minutes, do not rinse afterwards, Disp: 51 g, Rfl: 12   sodium fluoride (SF 5000 PLUS) 1.1 % CREA dental cream, Brush teeth thoroughly for 2 minutes, do not rinse afterwards., Disp: 51 g, Rfl: 12   telmisartan (MICARDIS) 80 MG tablet, Take 1 tablet (80 mg total) by mouth daily., Disp: 90 tablet, Rfl: 1   traMADol (ULTRAM) 50 MG tablet, Take 2 tablets (100 mg total) by mouth every 6 (six) hours as needed (for pain)., Disp: 240 tablet, Rfl: 5   Medications ordered in this encounter:  No orders of the defined types were placed in this encounter.    *  If you need refills on other medications prior to your next appointment, please contact your pharmacy*  Follow-Up: Call back or seek an in-person evaluation if the symptoms worsen or if the condition fails to improve as anticipated.  [facility] [phone]  Other Instructions Take antibiotic (Doxycycline) as directed.  Increase fluids.  Get plenty of rest. Use Mucinex for congestion. Tessalon for cough as directed. Take a daily probiotic (I recommend Align or Culturelle, but even Activia Yogurt may be beneficial).  A humidifier placed in the bedroom may offer some relief for a dry, scratchy  throat of nasal irritation.  Read information below on acute bronchitis. Please call or return to clinic if symptoms are not improving.  Acute Bronchitis Bronchitis is when the airways that extend from the windpipe into the lungs get red, puffy, and painful (inflamed). Bronchitis often causes thick spit (mucus) to develop. This leads to a cough. A cough is the most common symptom of bronchitis. In acute bronchitis, the condition usually begins suddenly and goes away over time (usually in 2 weeks). Smoking, allergies, and asthma can make bronchitis worse. Repeated episodes of bronchitis may cause more lung problems.  HOME CARE Rest. Drink enough fluids to keep your pee (urine) clear or pale yellow (unless you need to limit fluids as told by your doctor). Only take over-the-counter or prescription medicines as told by your doctor. Avoid smoking and secondhand smoke. These can make bronchitis worse. If you are a smoker, think about using nicotine gum or skin patches. Quitting smoking will help your lungs heal faster. Reduce the chance of getting bronchitis again by: Washing your hands often. Avoiding people with cold symptoms. Trying not to touch your hands to your mouth, nose, or eyes. Follow up with your doctor as told.  GET HELP IF: Your symptoms do not improve after 1 week of treatment. Symptoms include: Cough. Fever. Coughing up thick spit. Body aches. Chest congestion. Chills. Shortness of breath. Sore throat.  GET HELP RIGHT AWAY IF:  You have an increased fever. You have chills. You have severe shortness of breath. You have bloody thick spit (sputum). You throw up (vomit) often. You lose too much body fluid (dehydration). You have a severe headache. You faint.  MAKE SURE YOU:  Understand these instructions. Will watch your condition. Will get help right away if you are not doing well or get worse. Document Released: 10/16/2007 Document Revised: 12/30/2012 Document  Reviewed: 10/20/2012 [facility] Patient Information 2015 [place], [place]. This information is not intended to replace advice given to you by your health care provider. Make sure you discuss any questions you have with your health care provider.    If you have been instructed to have an in-person evaluation today at a local Urgent Care facility, please use the link below. It will take you to a list of all of our available [facility], including address, phone number and hours of operation. Please do not delay care.  [facility]  If you or a family member do not have a primary care provider, use the link below to schedule a visit and establish care. When you choose a [facility] primary care physician or advanced practice provider, you gain a long-term partner in health. Find a Primary Care Provider  Learn more about [facility]'s in-office and virtual care options: [facility] - Get Care Now

## 2022-12-12 NOTE — Progress Notes (Signed)
Virtual Visit Consent   Courtney Lopez, you are scheduled for a virtual visit with a [facility] provider today. Just as with appointments in the office, your consent must be obtained to participate. Your consent will be active for this visit and any virtual visit you may have with one of our providers in the next 365 days. If you have a MyChart account, a copy of this consent can be sent to you electronically.  As this is a virtual visit, video technology does not allow for your provider to perform a traditional examination. This may limit your provider's ability to fully assess your condition. If your provider identifies any concerns that need to be evaluated in person or the need to arrange testing (such as labs, EKG, etc.), we will make arrangements to do so. Although advances in technology are sophisticated, we cannot ensure that it will always work on either your end or our end. If the connection with a video visit is poor, the visit may have to be switched to a telephone visit. With either a video or telephone visit, we are not always able to ensure that we have a secure connection.  By engaging in this virtual visit, you consent to the provision of healthcare and authorize for your insurance to be billed (if applicable) for the services provided during this visit. Depending on your insurance coverage, you may receive a charge related to this service.  I need to obtain your verbal consent now. Are you willing to proceed with your visit today? Courtney Lopez has provided verbal consent on 12/12/2022 for a virtual visit (video or telephone). Courtney Lopez, [place]  Date: 12/12/2022 4:08 PM  Virtual Visit via Video Note   I, Courtney Lopez, connected with  Courtney Lopez  ([id number], [birth year]) on 12/12/22 at  4:00 PM EDT by a video-enabled telemedicine application and verified that I am speaking with the correct person using two identifiers.  Location: Patient: Virtual  Visit Location Patient: Home Provider: Virtual Visit Location Provider: Home Office   I discussed the limitations of evaluation and management by telemedicine and the availability of in person appointments. The patient expressed understanding and agreed to proceed.    History of Present Illness: Courtney Lopez is a 62 y.o. who identifies as a female who was assigned female at birth, and is being seen today for concern of a secondary infection after recent COVID-19 infection in the past 1.5 weeks. Notes continued congestion and cough that is worse at night and productive of phlegm. Has had a couple of episodes of post-tussive emesis. Fever up to 100.7 yesterday. Notes still feeling fatigued. Now with sinus headache but without facial pain.   OTC -- Tylenol   HPI: HPI  Problems:  Patient Active Problem List   Diagnosis Date Noted   Long-term current use of injectable noninsulin antidiabetic medication 09/28/2022   Recurrent pleural effusion on right 04/16/2022   Elevated liver enzymes 04/16/2022   Hospital discharge follow-up 04/16/2022   Failure to thrive in adult 03/07/2022   S/P AVR (aortic valve replacement) 02/14/2022   Contact dermatitis and eczema due to plant 09/23/2021   RAS (renal artery stenosis) (HCC) 03/28/2021   Ankle injury, sequela 06/07/2020   Personal history of COVID-19 05/28/2020   Diabetic nephropathy associated with type 2 diabetes mellitus (HCC) 10/21/2019   Renal artery stenosis (HCC) 10/27/2018   Thoracic aortic aneurysm without rupture (HCC)    Paroxysmal tachycardia (HCC) 08/12/2017   Thyroid nodule 05/15/2017  Ascending aorta dilatation (HCC) 05/27/2016   Bicuspid aortic valve 05/27/2016   Encounter for preventive health examination 10/29/2015   Adjustment disorder with anxious mood 06/13/2014   DM type 2 with diabetic mixed hyperlipidemia (HCC) 03/25/2012   Aortic stenosis    Screening for colon cancer 08/25/2011   Screening for breast cancer  08/25/2011   Overweight 08/25/2011   Hyperlipidemia associated with type 2 diabetes mellitus (HCC) 08/25/2011   Cervical spine disease 06/18/2011   Hypertension 06/18/2011    Allergies:  Allergies  Allergen Reactions   Hctz [Hydrochlorothiazide] Other (See Comments)    Decreased gfr   Prozac [Fluoxetine Hcl] Palpitations   Medications:  Current Outpatient Medications:    benzonatate (TESSALON) 100 MG capsule, Take 1 capsule (100 mg total) by mouth 3 (three) times daily as needed for cough., Disp: 30 capsule, Rfl: 0   doxycycline (VIBRA-TABS) 100 MG tablet, Take 1 tablet (100 mg total) by mouth 2 (two) times daily., Disp: 14 tablet, Rfl: 0   acetaminophen (TYLENOL) 325 MG tablet, Take 2 tablets (650 mg total) by mouth every 6 (six) hours as needed for mild pain., Disp: , Rfl:    ALPRAZolam (XANAX) 0.25 MG tablet, Take 1 tablet (0.25 mg total) by mouth 2 (two) times daily as needed., Disp: 60 tablet, Rfl: 5   amLODipine (NORVASC) 5 MG tablet, Take 1 tablet (5 mg total) by mouth daily., Disp: 30 tablet, Rfl: 1   amoxicillin (AMOXIL) 500 MG capsule, Take 2,000 mg by mouth See admin instructions. Take 2,000 mg by mouth one hour prior to dental procedures, Disp: , Rfl:    aspirin EC 81 MG tablet, Take 1 tablet (81 mg total) by mouth daily. Swallow whole., Disp: 30 tablet, Rfl: 12   blood glucose meter kit and supplies KIT, Dispense based on patient and insurance preference. Use up to four times daily as directed., Disp: 1 each, Rfl: 0   carvedilol (COREG) 25 MG tablet, Take 1 tablet (25 mg total) by mouth 2 (two) times daily with a meal., Disp: 180 tablet, Rfl: 1   estradiol (ESTRACE) 0.1 MG/GM vaginal cream, Place 0.25 Applicatorfuls vaginally 2 (two) times a week., Disp: 42.5 g, Rfl: 2   fluticasone (FLONASE) 50 MCG/ACT nasal spray, Place 2 sprays into both nostrils daily., Disp: 16 g, Rfl: 3   glucose blood test strip, Use up to 4 times a day as directed, Disp: 100 each, Rfl: 0    HYDROcodone-acetaminophen (NORCO) 10-325 MG tablet, Take 1 tablet by mouth every 6 (six) hours as needed., Disp: 30 tablet, Rfl: 0   Menthol, Topical Analgesic, (BIOFREEZE EX), Apply 1 application  topically 3 (three) times daily as needed (neck pain)., Disp: , Rfl:    metFORMIN (GLUCOPHAGE-XR) 750 MG 24 hr tablet, Take 1 tablet (750 mg total) by mouth daily with breakfast., Disp: 90 tablet, Rfl: 1   rosuvastatin (CRESTOR) 5 MG tablet, Take 1 tablet (5 mg total) by mouth at bedtime., Disp: 90 tablet, Rfl: 2   Semaglutide,0.25 or 0.5MG /DOS, 2 MG/3ML SOPN, Inject 0.5 mg into the skin once a week., Disp: 9 mL, Rfl: 0   sodium fluoride (PREVIDENT 5000 PLUS) 1.1 % CREA dental cream, Brush teeth thoroughly for 2 minutes, do not rinse afterwards, Disp: 51 g, Rfl: 12   sodium fluoride (SF 5000 PLUS) 1.1 % CREA dental cream, Brush teeth thoroughly for 2 minutes, do not rinse afterwards., Disp: 51 g, Rfl: 12   telmisartan (MICARDIS) 80 MG tablet, Take 1 tablet (80 mg total)  by mouth daily., Disp: 90 tablet, Rfl: 1   traMADol (ULTRAM) 50 MG tablet, Take 2 tablets (100 mg total) by mouth every 6 (six) hours as needed (for pain)., Disp: 240 tablet, Rfl: 5  Observations/Objective: Patient is well-developed, well-nourished in no acute distress.  Resting comfortably at home.  Head is normocephalic, atraumatic.  No labored breathing.  Speech is clear and coherent with logical content.  Patient is alert and oriented at baseline.   Assessment and Plan: 1. Acute bacterial bronchitis - benzonatate (TESSALON) 100 MG capsule; Take 1 capsule (100 mg total) by mouth 3 (three) times daily as needed for cough.  Dispense: 30 capsule; Refill: 0 - doxycycline (VIBRA-TABS) 100 MG tablet; Take 1 tablet (100 mg total) by mouth 2 (two) times daily.  Dispense: 14 tablet; Refill: 0  Rx Doxycycline.  Increase fluids.  Rest.  Saline nasal spray.  Probiotic.  Mucinex as directed.  Humidifier in bedroom. Tessalon per orders.  Call  or return to clinic if symptoms are not improving.   Follow Up Instructions: I discussed the assessment and treatment plan with the patient. The patient was provided an opportunity to ask questions and all were answered. The patient agreed with the plan and demonstrated an understanding of the instructions.  A copy of instructions were sent to the patient via MyChart unless otherwise noted below.   The patient was advised to call back or seek an in-person evaluation if the symptoms worsen or if the condition fails to improve as anticipated.  Time:  I spent 10 minutes with the patient via telehealth technology discussing the above problems/concerns.    Courtney Climes, PA-C

## 2022-12-27 ENCOUNTER — Other Ambulatory Visit: Payer: Self-pay

## 2022-12-30 ENCOUNTER — Other Ambulatory Visit: Payer: Self-pay

## 2022-12-30 ENCOUNTER — Ambulatory Visit: Payer: 59 | Admitting: Internal Medicine

## 2022-12-30 VITALS — BP 136/82 | HR 73 | Temp 98.0°F | Ht 66.0 in | Wt 166.6 lb

## 2022-12-30 DIAGNOSIS — Z7985 Long-term (current) use of injectable non-insulin antidiabetic drugs: Secondary | ICD-10-CM

## 2022-12-30 DIAGNOSIS — D509 Iron deficiency anemia, unspecified: Secondary | ICD-10-CM

## 2022-12-30 DIAGNOSIS — E1169 Type 2 diabetes mellitus with other specified complication: Secondary | ICD-10-CM

## 2022-12-30 DIAGNOSIS — R748 Abnormal levels of other serum enzymes: Secondary | ICD-10-CM | POA: Diagnosis not present

## 2022-12-30 DIAGNOSIS — E1121 Type 2 diabetes mellitus with diabetic nephropathy: Secondary | ICD-10-CM

## 2022-12-30 DIAGNOSIS — F411 Generalized anxiety disorder: Secondary | ICD-10-CM

## 2022-12-30 DIAGNOSIS — E782 Mixed hyperlipidemia: Secondary | ICD-10-CM | POA: Diagnosis not present

## 2022-12-30 DIAGNOSIS — E785 Hyperlipidemia, unspecified: Secondary | ICD-10-CM | POA: Diagnosis not present

## 2022-12-30 DIAGNOSIS — F4322 Adjustment disorder with anxiety: Secondary | ICD-10-CM

## 2022-12-30 DIAGNOSIS — I7121 Aneurysm of the ascending aorta, without rupture: Secondary | ICD-10-CM

## 2022-12-30 DIAGNOSIS — E876 Hypokalemia: Secondary | ICD-10-CM

## 2022-12-30 LAB — POCT GLYCOSYLATED HEMOGLOBIN (HGB A1C): Hemoglobin A1C: 5.7 % — AB (ref 4.0–5.6)

## 2022-12-30 LAB — LDL CHOLESTEROL, DIRECT: Direct LDL: 50 mg/dL

## 2022-12-30 MED ORDER — PROMETHAZINE-DM 6.25-15 MG/5ML PO SYRP
5.0000 mL | ORAL_SOLUTION | Freq: Four times a day (QID) | ORAL | 0 refills | Status: DC | PRN
  Filled 2022-12-30: qty 118, 6d supply, fill #0

## 2022-12-30 MED ORDER — HYDROCODONE-ACETAMINOPHEN 10-325 MG PO TABS
1.0000 | ORAL_TABLET | Freq: Every day | ORAL | 0 refills | Status: DC | PRN
  Filled 2022-12-30: qty 30, 30d supply, fill #0

## 2022-12-30 MED ORDER — DULOXETINE HCL 20 MG PO CPEP
20.0000 mg | ORAL_CAPSULE | Freq: Every day | ORAL | 1 refills | Status: DC
Start: 2022-12-30 — End: 2023-06-17
  Filled 2022-12-30: qty 90, 90d supply, fill #0
  Filled 2023-03-23: qty 90, 90d supply, fill #1

## 2022-12-30 MED ORDER — SEMAGLUTIDE (1 MG/DOSE) 4 MG/3ML ~~LOC~~ SOPN
1.0000 mg | PEN_INJECTOR | SUBCUTANEOUS | 2 refills | Status: DC
  Filled 2022-12-30: qty 3, 28d supply, fill #0
  Filled 2023-02-10: qty 3, 28d supply, fill #1
  Filled 2023-03-23: qty 3, 28d supply, fill #2

## 2022-12-30 NOTE — Assessment & Plan Note (Signed)
Increasing her dose of ozempic to 1 mg weekly

## 2022-12-30 NOTE — Progress Notes (Signed)
Subjective:  Patient ID: Courtney Lopez, female    DOB: 02/24/1961  Age: 63 y.o. MRN: [id number]  CC: The primary encounter diagnosis was DM type 2 with diabetic mixed hyperlipidemia (HCC). Diagnoses of Iron deficiency anemia, unspecified iron deficiency anemia type, Generalized anxiety disorder, Aneurysm of ascending aorta without rupture (HCC), Long-term current use of injectable noninsulin antidiabetic medication, Hyperlipidemia associated with type 2 diabetes mellitus (HCC), Elevated liver enzymes, Diabetic nephropathy associated with type 2 diabetes mellitus (HCC), and Adjustment disorder with anxious mood were also pertinent to this visit.   HPI Courtney Lopez presents for  Chief Complaint  Patient presents with   Medical Management of Chronic Issues   GAD:   she has been trying to wean herself  off of  alprazolam  from bid to once daily ; however she has been very anxious about her blood pressure and the effect of her anxiety on her BP.   [name] Discussed adding cymbalta  for her anxiety,  she hs had a  prior reaction to paxil (takes tramadol bid).    IDA:  iron deficiency hindered by constipation which has improved with beef liver capsules   Had COVID in early August :  had mostly bronchitis  with recurrent  post tussive emesis.  Was given tessalon perles   DM obesity/htn:  she feels generally well, is checking blood sugars once daily at variable times.  BS have been under 130 fasting and < 150 post prandially.  Denies any recent hypoglyemic events.  Taking her medications as directed. Following a carbohydrate modified diet 6 days per week. Denies numbness, burning and tingling of extremities. Appetite is good.  Taking metformin and ozempic .   ready to go up to 1.00 mg weekly   Outpatient Medications Prior to Visit  Medication Sig Dispense Refill   acetaminophen (TYLENOL) 325 MG tablet Take 2 tablets (650 mg total) by mouth every 6 (six) hours as needed for mild pain.      ALPRAZolam (XANAX) 0.25 MG tablet Take 1 tablet (0.25 mg total) by mouth 2 (two) times daily as needed. 60 tablet 5   amLODipine (NORVASC) 5 MG tablet Take 1 tablet (5 mg total) by mouth daily. (Patient taking differently: Take 5 mg by mouth as needed. PRN once daily if BP is greater than 140/90) 30 tablet 1   amoxicillin (AMOXIL) 500 MG capsule Take 2,000 mg by mouth See admin instructions. Take 2,000 mg by mouth one hour prior to dental procedures     aspirin EC 81 MG tablet Take 1 tablet (81 mg total) by mouth daily. Swallow whole. 30 tablet 12   blood glucose meter kit and supplies KIT Dispense based on patient and insurance preference. Use up to four times daily as directed. 1 each 0   carvedilol (COREG) 25 MG tablet Take 1 tablet (25 mg total) by mouth 2 (two) times daily with a meal. 180 tablet 1   estradiol (ESTRACE) 0.1 MG/GM vaginal cream Place 0.25 Applicatorfuls vaginally 2 (two) times a week. 42.5 g 2   fluticasone (FLONASE) 50 MCG/ACT nasal spray Place 2 sprays into both nostrils daily. 16 g 3   glucose blood test strip Use up to 4 times a day as directed 100 each 0   Menthol, Topical Analgesic, (BIOFREEZE EX) Apply 1 application  topically 3 (three) times daily as needed (neck pain).     metFORMIN (GLUCOPHAGE-XR) 750 MG 24 hr tablet Take 1 tablet (750 mg total) by mouth daily with  breakfast. 90 tablet 1   rosuvastatin (CRESTOR) 5 MG tablet Take 1 tablet (5 mg total) by mouth at bedtime. 90 tablet 2   sodium fluoride (PREVIDENT 5000 PLUS) 1.1 % CREA dental cream Brush teeth thoroughly for 2 minutes, do not rinse afterwards 51 g 12   sodium fluoride (SF 5000 PLUS) 1.1 % CREA dental cream Brush teeth thoroughly for 2 minutes, do not rinse afterwards. 51 g 12   telmisartan (MICARDIS) 80 MG tablet Take 1 tablet (80 mg total) by mouth daily. 90 tablet 1   traMADol (ULTRAM) 50 MG tablet Take 2 tablets (100 mg total) by mouth every 6 (six) hours as needed (for pain). 240 tablet 5   benzonatate  (TESSALON) 100 MG capsule Take 1 capsule (100 mg total) by mouth 3 (three) times daily as needed for cough. 30 capsule 0   doxycycline (VIBRA-TABS) 100 MG tablet Take 1 tablet (100 mg total) by mouth 2 (two) times daily. 14 tablet 0   HYDROcodone-acetaminophen (NORCO) 10-325 MG tablet Take 1 tablet by mouth every 6 (six) hours as needed. 30 tablet 0   Semaglutide,0.25 or 0.5MG /DOS, 2 MG/3ML SOPN Inject 0.5 mg into the skin once a week. 9 mL 0   No facility-administered medications prior to visit.    Review of Systems;  Patient denies headache, fevers, malaise, unintentional weight loss, skin rash, eye pain, sinus congestion and sinus pain, sore throat, dysphagia,  hemoptysis , cough, dyspnea, wheezing, chest pain, palpitations, orthopnea, edema, abdominal pain, nausea, melena, diarrhea, constipation, flank pain, dysuria, hematuria, urinary  Frequency, nocturia, numbness, tingling, seizures,  Focal weakness, Loss of consciousness,  Tremor, insomnia, depression, anxiety, and suicidal ideation.      Objective:  BP 136/82   Pulse 73   Temp 98 F (36.7 C) (Oral)   Ht 5\' 6"  (1.676 m)   Wt 166 lb 9.6 oz (75.6 kg)   LMP  (LMP Unknown)   SpO2 97%   BMI 26.89 kg/m   BP Readings from Last 3 Encounters:  12/30/22 136/82  11/08/22 (!) 140/98  09/27/22 124/78    Wt Readings from Last 3 Encounters:  12/30/22 166 lb 9.6 oz (75.6 kg)  11/08/22 166 lb (75.3 kg)  09/27/22 170 lb 12.8 oz (77.5 kg)    Physical Exam Vitals reviewed.  Constitutional:      General: She is not in acute distress.    Appearance: Normal appearance. She is normal weight. She is not ill-appearing, toxic-appearing or diaphoretic.  HENT:     Head: Normocephalic.  Eyes:     General: No scleral icterus.       Right eye: No discharge.        Left eye: No discharge.     Conjunctiva/sclera: Conjunctivae normal.  Cardiovascular:     Rate and Rhythm: Normal rate and regular rhythm.     Heart sounds: Normal heart sounds.   Pulmonary:     Effort: Pulmonary effort is normal. No respiratory distress.     Breath sounds: Normal breath sounds.  Musculoskeletal:        General: Normal range of motion.  Skin:    General: Skin is warm and dry.  Neurological:     General: No focal deficit present.     Mental Status: She is alert and oriented to person, place, and time. Mental status is at baseline.  Psychiatric:        Mood and Affect: Mood normal.        Behavior: Behavior normal.  Thought Content: Thought content normal.        Judgment: Judgment normal.     Lab Results  Component Value Date   HGBA1C 5.7 (A) 12/30/2022   HGBA1C 6.2 07/08/2022   HGBA1C 5.4 02/12/2022    Lab Results  Component Value Date   CREATININE 1.03 09/27/2022   CREATININE 1.01 07/08/2022   CREATININE 1.00 03/11/2022    Lab Results  Component Value Date   WBC 8.9 09/27/2022   HGB 12.7 09/27/2022   HCT 38.4 09/27/2022   PLT 266 09/27/2022   GLUCOSE 117 (H) 09/27/2022   CHOL 135 07/08/2022   TRIG 70.0 07/08/2022   HDL 60.40 07/08/2022   LDLDIRECT 62.0 07/08/2022   LDLCALC 60 07/08/2022   ALT 20 09/27/2022   AST 20 09/27/2022   NA 139 09/27/2022   K 3.9 09/27/2022   CL 102 09/27/2022   CREATININE 1.03 09/27/2022   BUN 30 (H) 09/27/2022   CO2 22 09/27/2022   TSH 3.03 06/29/2020   INR 1.5 (H) 02/14/2022   HGBA1C 5.7 (A) 12/30/2022   MICROALBUR 10.2 (H) 07/08/2022    MM 3D SCREEN BREAST BILATERAL  Result Date: 07/17/2022 CLINICAL DATA:  Screening. EXAM: DIGITAL SCREENING BILATERAL MAMMOGRAM WITH TOMOSYNTHESIS AND CAD TECHNIQUE: Bilateral screening digital craniocaudal and mediolateral oblique mammograms were obtained. Bilateral screening digital breast tomosynthesis was performed. The images were evaluated with computer-aided detection. COMPARISON:  Previous exam(s). ACR Breast Density Category b: There are scattered areas of fibroglandular density. FINDINGS: There are no findings suspicious for malignancy.  IMPRESSION: No mammographic evidence of malignancy. A result letter of this screening mammogram will be mailed directly to the patient. RECOMMENDATION: Screening mammogram in one year. (Code:SM-B-01Y) BI-RADS CATEGORY  1: Negative. Electronically Signed   By: Norva Pavlov M.D.   On: 07/17/2022 13:47    Assessment & Plan:  .DM type 2 with diabetic mixed hyperlipidemia (HCC) Assessment & Plan: Currently well-controlled on current medications .  hemoglobin A1c is at goal of less than 7.0 . Patient is reminded to schedule an annual eye exam and foot exam is normal today. Patient has  microalbuminuria. Patient is tolerating statin therapy for CAD risk reduction and on ACE/ARB for renal protection and hypertension    Lab Results  Component Value Date   HGBA1C 5.7 (A) 12/30/2022   Lab Results  Component Value Date   MICROALBUR 10.2 (H) 07/08/2022   MICROALBUR 6.2 (H) 12/18/2020       Orders: -     POCT glycosylated hemoglobin (Hb A1C) -     Lipid panel -     LDL cholesterol, direct -     Comprehensive metabolic panel  Iron deficiency anemia, unspecified iron deficiency anemia type -     Iron, TIBC and Ferritin Panel  Generalized anxiety disorder -     DULoxetine HCl; Take 1 capsule (20 mg total) by mouth daily.  Dispense: 90 capsule; Refill: 1  Aneurysm of ascending aorta without rupture (HCC) Assessment & Plan: Goal BP is 120/70.  Annual surveillance of aorta is scheduled with ECHO this august    Long-term current use of injectable noninsulin antidiabetic medication Assessment & Plan: Increasing her dose of ozempic to 1 mg weekly   Hyperlipidemia associated with type 2 diabetes mellitus (HCC) Assessment & Plan: LDL is at goal on rosuvastatin,  and LFTs are normal  Lab Results  Component Value Date   CHOL 135 07/08/2022   HDL 60.40 07/08/2022   LDLCALC 60 07/08/2022   LDLDIRECT 62.0  07/08/2022   TRIG 70.0 07/08/2022   CHOLHDL 2 07/08/2022   Lab Results  Component  Value Date   ALT 20 09/27/2022   AST 20 09/27/2022   ALKPHOS 85 07/08/2022   BILITOT 0.4 09/27/2022      Elevated liver enzymes Assessment & Plan: Likely related to recent surgery.   Resolved with repeat  Lab Results  Component Value Date   ALT 20 09/27/2022   AST 20 09/27/2022   ALKPHOS 85 07/08/2022   BILITOT 0.4 09/27/2022      Diabetic nephropathy associated with type 2 diabetes mellitus (HCC) Assessment & Plan: Excellent glycemic control with  metformin XR and recent addition of  Ozempic.  Continue ARB and statin    Lab Results  Component Value Date   HGBA1C 5.7 (A) 12/30/2022   Lab Results  Component Value Date   MICROALBUR 10.2 (H) 07/08/2022   MICROALBUR 6.2 (H) 12/18/2020        Adjustment disorder with anxious mood Assessment & Plan: Stable. No longer taking prozac.  BEgin to wean  Xanax  dose from 0.25 mg abid to 0.125 mg bid . Add cymbalta for management of anxiety 20 mg dose    Other orders -     Promethazine-DM; Take 5 mLs by mouth 4 (four) times daily as needed for cough.  Dispense: 118 mL; Refill: 0 -     Semaglutide (1 MG/DOSE); Inject 1 mg as directed once a week.  Dispense: 3 mL; Refill: 2 -     HYDROcodone-Acetaminophen; Take 1 tablet by mouth daily as needed.  Dispense: 30 tablet; Refill: 0   I provided  40 minutes on the day of this face-to-face  encounter reviewing patient's last visit with me, patient's  most recent visit with cardiology , prior  surgical and non surgical procedures, previous  labs and imaging studies, counseling on currently addressed issues,  and post visit ordering to diagnostics and therapeutics .    Follow-up: Return in about 3 months (around 04/01/2023) for chronic pain management.   Sherlene Shams, MD

## 2022-12-30 NOTE — Assessment & Plan Note (Signed)
Currently well-controlled on current medications .  hemoglobin A1c is at goal of less than 7.0 . Patient is reminded to schedule an annual eye exam and foot exam is normal today. Patient has  microalbuminuria. Patient is tolerating statin therapy for CAD risk reduction and on ACE/ARB for renal protection and hypertension    Lab Results  Component Value Date   HGBA1C 5.7 (A) 12/30/2022   Lab Results  Component Value Date   MICROALBUR 10.2 (H) 07/08/2022   MICROALBUR 6.2 (H) 12/18/2020

## 2022-12-30 NOTE — Assessment & Plan Note (Signed)
Likely related to recent surgery.   Resolved with repeat  Lab Results  Component Value Date   ALT 20 09/27/2022   AST 20 09/27/2022   ALKPHOS 85 07/08/2022   BILITOT 0.4 09/27/2022

## 2022-12-30 NOTE — Assessment & Plan Note (Signed)
Goal BP is 120/70.  Annual surveillance of aorta is scheduled with ECHO this august  ?

## 2022-12-30 NOTE — Assessment & Plan Note (Signed)
LDL is at goal on rosuvastatin,  and LFTs are normal  Lab Results  Component Value Date   CHOL 135 07/08/2022   HDL 60.40 07/08/2022   LDLCALC 60 07/08/2022   LDLDIRECT 62.0 07/08/2022   TRIG 70.0 07/08/2022   CHOLHDL 2 07/08/2022   Lab Results  Component Value Date   ALT 20 09/27/2022   AST 20 09/27/2022   ALKPHOS 85 07/08/2022   BILITOT 0.4 09/27/2022

## 2022-12-30 NOTE — Assessment & Plan Note (Addendum)
Stable. No longer taking prozac.  BEgin to wean  Xanax  dose from 0.25 mg abid to 0.125 mg bid . Add cymbalta for management of anxiety 20 mg dose

## 2022-12-30 NOTE — Patient Instructions (Addendum)
Reduce your alprazolam to 1/2 tablet twice daily for now.  Cough medicine with phenergan sent to pharmacy  Ozempic increased to 1 mg    Will refill hydrocodone later on today

## 2022-12-30 NOTE — Assessment & Plan Note (Signed)
Excellent glycemic control with  metformin XR and recent addition of  Ozempic.  Continue ARB and statin    Lab Results  Component Value Date   HGBA1C 5.7 (A) 12/30/2022   Lab Results  Component Value Date   MICROALBUR 10.2 (H) 07/08/2022   MICROALBUR 6.2 (H) 12/18/2020

## 2022-12-31 DIAGNOSIS — E876 Hypokalemia: Secondary | ICD-10-CM | POA: Insufficient documentation

## 2022-12-31 LAB — LIPID PANEL
Cholesterol: 121 mg/dL (ref 0–200)
HDL: 56.6 mg/dL (ref >39.00)
LDL Cholesterol: 50 mg/dL (ref 0–99)
NonHDL: 64.49
Total CHOL/HDL Ratio: 2
Triglycerides: 71 mg/dL (ref 0.0–149.0)
VLDL: 14.2 mg/dL (ref 0.0–40.0)

## 2022-12-31 LAB — COMPREHENSIVE METABOLIC PANEL
ALT: 14 U/L (ref 0–35)
AST: 18 U/L (ref 0–37)
Albumin: 4.2 g/dL (ref 3.5–5.2)
Alkaline Phosphatase: 82 U/L (ref 39–117)
BUN: 21 mg/dL (ref 6–23)
CO2: 26 meq/L (ref 19–32)
Calcium: 9.1 mg/dL (ref 8.4–10.5)
Chloride: 102 meq/L (ref 96–112)
Creatinine, Ser: 0.88 mg/dL (ref 0.40–1.20)
GFR: 69.67 mL/min (ref >60.00)
Glucose, Bld: 112 mg/dL — ABNORMAL HIGH (ref 70–99)
Potassium: 3.3 meq/L — ABNORMAL LOW (ref 3.5–5.1)
Sodium: 139 meq/L (ref 135–145)
Total Bilirubin: 0.7 mg/dL (ref 0.2–1.2)
Total Protein: 6.6 g/dL (ref 6.0–8.3)

## 2022-12-31 LAB — IRON,TIBC AND FERRITIN PANEL
%SAT: 21 % (ref 16–45)
Ferritin: 67 ng/mL (ref 16–288)
Iron: 71 ug/dL (ref 45–160)
TIBC: 332 mcg/dL (calc) (ref 250–450)

## 2022-12-31 MED ORDER — POTASSIUM CHLORIDE CRYS ER 20 MEQ PO TBCR
20.0000 meq | EXTENDED_RELEASE_TABLET | Freq: Every day | ORAL | 0 refills | Status: DC
  Filled 2022-12-31: qty 5, 5d supply, fill #0

## 2022-12-31 NOTE — Addendum Note (Signed)
Addended by: Sherlene Shams on: 12/31/2022 08:07 PM   Modules accepted: Orders

## 2022-12-31 NOTE — Assessment & Plan Note (Signed)
Etiology unclear.  Will replace.

## 2023-01-01 ENCOUNTER — Other Ambulatory Visit: Payer: Self-pay

## 2023-01-08 ENCOUNTER — Other Ambulatory Visit: Payer: Self-pay

## 2023-02-10 ENCOUNTER — Other Ambulatory Visit: Payer: Self-pay

## 2023-02-17 ENCOUNTER — Other Ambulatory Visit: Payer: Self-pay

## 2023-02-28 ENCOUNTER — Other Ambulatory Visit (HOSPITAL_BASED_OUTPATIENT_CLINIC_OR_DEPARTMENT_OTHER): Payer: Self-pay

## 2023-03-03 ENCOUNTER — Other Ambulatory Visit: Payer: Self-pay

## 2023-03-24 ENCOUNTER — Other Ambulatory Visit: Payer: Self-pay

## 2023-03-24 ENCOUNTER — Other Ambulatory Visit: Payer: Self-pay | Admitting: Surgery

## 2023-03-24 DIAGNOSIS — I7121 Aneurysm of the ascending aorta, without rupture: Secondary | ICD-10-CM

## 2023-04-01 ENCOUNTER — Ambulatory Visit: Payer: 59 | Admitting: Internal Medicine

## 2023-04-07 LAB — HM DIABETES EYE EXAM

## 2023-04-16 ENCOUNTER — Other Ambulatory Visit: Payer: Self-pay

## 2023-04-16 ENCOUNTER — Encounter: Payer: Self-pay | Admitting: Internal Medicine

## 2023-04-16 ENCOUNTER — Ambulatory Visit: Payer: 59 | Admitting: Internal Medicine

## 2023-04-16 VITALS — BP 198/114 | HR 78 | Ht 66.0 in | Wt 162.0 lb

## 2023-04-16 DIAGNOSIS — E782 Mixed hyperlipidemia: Secondary | ICD-10-CM | POA: Diagnosis not present

## 2023-04-16 DIAGNOSIS — R002 Palpitations: Secondary | ICD-10-CM | POA: Diagnosis not present

## 2023-04-16 DIAGNOSIS — Z1211 Encounter for screening for malignant neoplasm of colon: Secondary | ICD-10-CM

## 2023-04-16 DIAGNOSIS — E785 Hyperlipidemia, unspecified: Secondary | ICD-10-CM

## 2023-04-16 DIAGNOSIS — Z7985 Long-term (current) use of injectable non-insulin antidiabetic drugs: Secondary | ICD-10-CM | POA: Diagnosis not present

## 2023-04-16 DIAGNOSIS — I10 Essential (primary) hypertension: Secondary | ICD-10-CM | POA: Diagnosis not present

## 2023-04-16 DIAGNOSIS — Z7984 Long term (current) use of oral hypoglycemic drugs: Secondary | ICD-10-CM

## 2023-04-16 DIAGNOSIS — I701 Atherosclerosis of renal artery: Secondary | ICD-10-CM

## 2023-04-16 DIAGNOSIS — E1169 Type 2 diabetes mellitus with other specified complication: Secondary | ICD-10-CM | POA: Diagnosis not present

## 2023-04-16 MED ORDER — HYDROCODONE-ACETAMINOPHEN 10-325 MG PO TABS
1.0000 | ORAL_TABLET | Freq: Every day | ORAL | 0 refills | Status: DC | PRN
  Filled 2023-04-16: qty 30, 30d supply, fill #0

## 2023-04-16 MED ORDER — METFORMIN HCL ER 750 MG PO TB24
750.0000 mg | ORAL_TABLET | Freq: Every day | ORAL | 2 refills | Status: DC
  Filled 2023-04-16 – 2023-06-04 (×2): qty 90, 90d supply, fill #0
  Filled 2023-08-31: qty 90, 90d supply, fill #1
  Filled 2023-12-29: qty 90, 90d supply, fill #2

## 2023-04-16 MED ORDER — ALPRAZOLAM 0.25 MG PO TABS
0.2500 mg | ORAL_TABLET | Freq: Two times a day (BID) | ORAL | 5 refills | Status: DC | PRN
  Filled 2023-04-16 – 2023-05-04 (×2): qty 60, 30d supply, fill #0
  Filled 2023-06-17: qty 60, 30d supply, fill #1
  Filled 2023-07-23: qty 60, 30d supply, fill #2
  Filled 2023-08-26: qty 60, 30d supply, fill #3
  Filled 2023-09-25: qty 60, 30d supply, fill #4

## 2023-04-16 MED ORDER — AMLODIPINE BESYLATE 5 MG PO TABS
5.0000 mg | ORAL_TABLET | Freq: Every day | ORAL | 1 refills | Status: DC
  Filled 2023-04-16: qty 90, 90d supply, fill #0

## 2023-04-16 MED ORDER — SEMAGLUTIDE (1 MG/DOSE) 4 MG/3ML ~~LOC~~ SOPN
1.0000 mg | PEN_INJECTOR | SUBCUTANEOUS | 2 refills | Status: DC
  Filled 2023-04-16: qty 3, 28d supply, fill #0
  Filled 2023-05-19: qty 3, 28d supply, fill #1
  Filled 2023-07-04: qty 3, 28d supply, fill #2

## 2023-04-16 MED ORDER — HYDRALAZINE HCL 50 MG PO TABS
50.0000 mg | ORAL_TABLET | Freq: Three times a day (TID) | ORAL | 1 refills | Status: DC
  Filled 2023-04-16: qty 90, 30d supply, fill #0
  Filled 2024-04-04: qty 90, 30d supply, fill #1

## 2023-04-16 MED ORDER — FUROSEMIDE 20 MG PO TABS
20.0000 mg | ORAL_TABLET | ORAL | 3 refills | Status: AC | PRN
  Filled 2023-04-16: qty 30, 30d supply, fill #0
  Filled 2024-03-13: qty 30, 30d supply, fill #1

## 2023-04-16 NOTE — Patient Instructions (Addendum)
Please resume amlodipine daily starting at 5 mg   Increase to 10 mg if not at goal 130/80 lo less after 3 days    Resume hydralazine starting 50 mg three times daily starting tomorrow   For tonight :  take 5 mg amlodipine .   repeat your dose after 1-2 hours if not at 130/80    Ok to use alprazolam if all else fails

## 2023-04-16 NOTE — Progress Notes (Unsigned)
Has  Subjective:  Patient ID: Courtney Lopez, female    DOB: 1961-03-26  Age: 62 y.o. MRN: [id number]  CC: The primary encounter diagnosis was Primary hypertension. Diagnoses of Hyperlipidemia associated with type 2 diabetes mellitus (HCC), DM type 2 with diabetic mixed hyperlipidemia (HCC), Palpitations, Colon cancer screening, Renal artery stenosis (HCC), and RAS (renal artery stenosis) (HCC) were also pertinent to this visit.   HPI [name] presents for  Chief Complaint  Patient presents with   Medical Management of Chronic Issues    1) Accelerated Hypertension: her BP has been very elevated over the last 24 hours despite taking carvedilol and telmisartan at max doses on a regular basis.  She uses  prn amlodipine 5mg ; took a dose last evening and BP improved transiently . Has not been using hydralazine for unclear reasons (no contraindications_ .  Today's readings were higher than yesterday  she denies chest pain , dyspnea. No weight gain, orthopnea or fluid retention except puffiness under eyes.    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 Has been trying to wean herself off of  alprazolam and has been taking 1/2 tablet (0.125 mg ) twice daily.  Has been using cymbalta which is helping her use less tramadol and less hydrocodone   Outpatient Medications Prior to Visit  Medication Sig Dispense Refill   acetaminophen (TYLENOL) 325 MG tablet Take 2 tablets (650 mg total) by mouth every 6 (six) hours as needed for mild pain.     amoxicillin (AMOXIL) 500 MG capsule Take 2,000 mg by mouth See admin instructions. Take 2,000 mg by mouth one hour prior to dental procedures     aspirin EC 81 MG tablet Take 1 tablet (81 mg total) by mouth daily. Swallow whole. 30 tablet 12   blood glucose meter kit and supplies KIT Dispense based on patient and insurance preference. Use up to four times daily as directed. 1 each 0   carvedilol (COREG) 25 MG tablet Take  1 tablet (25 mg total) by mouth 2 (two) times daily with a meal. 180 tablet 1   DULoxetine (CYMBALTA) 20 MG capsule Take 1 capsule (20 mg total) by mouth daily. 90 capsule 1   estradiol (ESTRACE) 0.1 MG/GM vaginal cream Place 0.25 Applicatorfuls vaginally 2 (two) times a week. 42.5 g 2   fluticasone (FLONASE) 50 MCG/ACT nasal spray Place 2 sprays into both nostrils daily. 16 g 3   glucose blood test strip Use up to 4 times a day as directed 100 each 0   Menthol, Topical Analgesic, (BIOFREEZE EX) Apply 1 application  topically 3 (three) times daily as needed (neck pain).     rosuvastatin (CRESTOR) 5 MG tablet Take 1 tablet (5 mg total) by mouth at bedtime. 90 tablet 2   sodium fluoride (PREVIDENT 5000 PLUS) 1.1 % CREA dental cream Brush teeth thoroughly for 2 minutes, do not rinse afterwards 51 g 12   sodium fluoride (SF 5000 PLUS) 1.1 % CREA dental cream Brush teeth thoroughly for 2 minutes, do not rinse afterwards. 51 g 12   telmisartan (MICARDIS) 80 MG tablet Take 1 tablet (80 mg total) by mouth daily. 90 tablet 1   traMADol (ULTRAM) 50 MG tablet Take 2 tablets (100 mg total) by mouth every 6 (six) hours as needed (for pain). 240 tablet 5   ALPRAZolam (XANAX) 0.25 MG tablet Take 1 tablet (0.25 mg total) by mouth 2 (two) times daily as needed. 60 tablet  5   amLODipine (NORVASC) 5 MG tablet Take 1 tablet (5 mg total) by mouth daily. (Patient taking differently: Take 5 mg by mouth as needed. PRN once daily if BP is greater than 140/90) 30 tablet 1   HYDROcodone-acetaminophen (NORCO) 10-325 MG tablet Take 1 tablet by mouth daily as needed. 30 tablet 0   metFORMIN (GLUCOPHAGE-XR) 750 MG 24 hr tablet Take 1 tablet (750 mg total) by mouth daily with breakfast. 90 tablet 1   Semaglutide, 1 MG/DOSE, 4 MG/3ML SOPN Inject 1 mg as directed once a week. 3 mL 2   potassium chloride SA (KLOR-CON M) 20 MEQ tablet Take 1 tablet (20 mEq total) by mouth daily. (Patient not taking: Reported on 04/16/2023) 5 tablet 0    promethazine-dextromethorphan (PROMETHAZINE-DM) 6.25-15 MG/5ML syrup Take 5 mLs by mouth 4 (four) times daily as needed for cough. (Patient not taking: Reported on 04/16/2023) 118 mL 0   No facility-administered medications prior to visit.    Review of Systems;  Patient denies headache, fevers, malaise, unintentional weight loss, skin rash, eye pain, sinus congestion and sinus pain, sore throat, dysphagia,  hemoptysis , cough, dyspnea, wheezing, chest pain, palpitations, orthopnea, edema, abdominal pain, nausea, melena, diarrhea, constipation, flank pain, dysuria, hematuria, urinary  Frequency, nocturia, numbness, tingling, seizures,  Focal weakness, Loss of consciousness,  Tremor, insomnia, depression, anxiety, and suicidal ideation.      Objective:  BP (!) 198/114   Pulse 78   Ht 5\' 6"  (1.676 m)   Wt 162 lb (73.5 kg)   LMP  (LMP Unknown)   SpO2 98%   BMI 26.15 kg/m   BP Readings from Last 3 Encounters:  04/16/23 (!) 198/114  12/30/22 136/82  11/08/22 (!) 140/98    Wt Readings from Last 3 Encounters:  04/16/23 162 lb (73.5 kg)  12/30/22 166 lb 9.6 oz (75.6 kg)  11/08/22 166 lb (75.3 kg)    Physical Exam Vitals reviewed.  Constitutional:      General: She is not in acute distress.    Appearance: Normal appearance. She is normal weight. She is not ill-appearing, toxic-appearing or diaphoretic.  HENT:     Head: Normocephalic.  Eyes:     General: No scleral icterus.       Right eye: No discharge.        Left eye: No discharge.     Conjunctiva/sclera: Conjunctivae normal.  Cardiovascular:     Rate and Rhythm: Normal rate and regular rhythm.     Heart sounds: Normal heart sounds.  Pulmonary:     Effort: Pulmonary effort is normal. No respiratory distress.     Breath sounds: Normal breath sounds.  Chest:     Chest wall: No swelling or tenderness.       Comments: Midline surgical scar well healed  Musculoskeletal:        General: Normal range of motion.   Lymphadenopathy:     Cervical: Cervical adenopathy present.  Skin:    General: Skin is warm and dry.  Neurological:     General: No focal deficit present.     Mental Status: She is alert and oriented to person, place, and time. Mental status is at baseline.  Psychiatric:        Mood and Affect: Mood normal.        Behavior: Behavior normal.        Thought Content: Thought content normal.        Judgment: Judgment normal.    Lab Results  Component  Value Date   HGBA1C 5.7 (A) 12/30/2022   HGBA1C 6.2 07/08/2022   HGBA1C 5.4 02/12/2022    Lab Results  Component Value Date   CREATININE 0.88 12/30/2022   CREATININE 1.03 09/27/2022   CREATININE 1.01 07/08/2022    Lab Results  Component Value Date   WBC 8.9 09/27/2022   HGB 12.7 09/27/2022   HCT 38.4 09/27/2022   PLT 266 09/27/2022   GLUCOSE 112 (H) 12/30/2022   CHOL 121 12/30/2022   TRIG 71.0 12/30/2022   HDL 56.60 12/30/2022   LDLDIRECT 50.0 12/30/2022   LDLCALC 50 12/30/2022   ALT 14 12/30/2022   AST 18 12/30/2022   NA 139 12/30/2022   K 3.3 (L) 12/30/2022   CL 102 12/30/2022   CREATININE 0.88 12/30/2022   BUN 21 12/30/2022   CO2 26 12/30/2022   TSH 3.03 06/29/2020   INR 1.5 (H) 02/14/2022   HGBA1C 5.7 (A) 12/30/2022   MICROALBUR 10.2 (H) 07/08/2022    MM 3D SCREEN BREAST BILATERAL  Result Date: 07/17/2022 CLINICAL DATA:  Screening. EXAM: DIGITAL SCREENING BILATERAL MAMMOGRAM WITH TOMOSYNTHESIS AND CAD TECHNIQUE: Bilateral screening digital craniocaudal and mediolateral oblique mammograms were obtained. Bilateral screening digital breast tomosynthesis was performed. The images were evaluated with computer-aided detection. COMPARISON:  Previous exam(s). ACR Breast Density Category b: There are scattered areas of fibroglandular density. FINDINGS: There are no findings suspicious for malignancy. IMPRESSION: No mammographic evidence of malignancy. A result letter of this screening mammogram will be mailed directly to  the patient. RECOMMENDATION: Screening mammogram in one year. (Code:SM-B-01Y) BI-RADS CATEGORY  1: Negative. Electronically Signed   By: Norva Pavlov M.D.   On: 07/17/2022 13:47    Assessment & Plan:  .Primary hypertension Assessment & Plan: Elevated for the past 24 hours.  I have ordered and reviewed a 12 lead EKG and find that there are no acute changes and patient is in sinus rhythm.   Recommend scheduled doses of hydralazine 50 mg tid   continue prn use of amlodipine and stop weaning the alprazolam as the elvations may be anxiety provoked.   Orders: -     Comprehensive metabolic panel  Hyperlipidemia associated with type 2 diabetes mellitus (HCC) -     Lipid panel -     LDL cholesterol, direct  DM type 2 with diabetic mixed hyperlipidemia (HCC) Assessment & Plan: Currently well-controlled on current medications .  hemoglobin A1c is at goal of less than 7.0 . Patient is reminded to schedule an annual eye exam and foot exam is normal today. Patient has  microalbuminuria. Patient is tolerating statin therapy for CAD risk reduction and on ACE/ARB for renal protection and hypertension    Lab Results  Component Value Date   HGBA1C 5.7 (A) 12/30/2022   Lab Results  Component Value Date   MICROALBUR 10.2 (H) 07/08/2022   MICROALBUR 6.2 (H) 12/18/2020       Orders: -     Hemoglobin A1c -     Comprehensive metabolic panel  Palpitations -     EKG 12-Lead  Colon cancer screening -     Cologuard  Renal artery stenosis (HCC) Assessment & Plan: ; intervention  has been  deferred but given current elevations in BP will recommend repeat doppler    RAS (renal artery stenosis) (HCC) Assessment & Plan: S/p left RA stent .  Normal readings march 2024 doppler   Other orders -     hydrALAZINE HCl; Take 1 tablet (50 mg total) by mouth  3 (three) times daily.  Dispense: 90 tablet; Refill: 1 -     Furosemide; Take 1 tablet (20 mg total) by mouth as needed for weight gain of 2 lbs  overnight  Dispense: 30 tablet; Refill: 3 -     HYDROcodone-Acetaminophen; Take 1 tablet by mouth daily as needed.  Dispense: 30 tablet; Refill: 0 -     Semaglutide (1 MG/DOSE); Inject 1 mg as directed once a week.  Dispense: 3 mL; Refill: 2 -     metFORMIN HCl ER; Take 1 tablet (750 mg total) by mouth daily with breakfast.  Dispense: 90 tablet; Refill: 2 -     ALPRAZolam; Take 1 tablet (0.25 mg total) by mouth 2 (two) times daily as needed.  Dispense: 60 tablet; Refill: 5 -     amLODIPine Besylate; Take 1 tablet (5 mg total) by mouth daily.  Dispense: 90 tablet; Refill: 1     I n addition to time spent reading her EKG,  Iprovided 30 minutes of face-to-face time during this encounter reviewing patient's last visit with me, patient's  most recent visit with cardiology,   recent surgical and non surgical procedures, previous  labs and imaging studies, counseling on currently addressed issues,  and post visit ordering to diagnostics and therapeutics .   Follow-up: Return in about 4 weeks (around 05/14/2023) for follow up diabetes.   Sherlene Shams, MD

## 2023-04-17 LAB — COMPREHENSIVE METABOLIC PANEL
ALT: 13 U/L (ref 0–35)
AST: 21 U/L (ref 0–37)
Albumin: 4.4 g/dL (ref 3.5–5.2)
Alkaline Phosphatase: 65 U/L (ref 39–117)
BUN: 13 mg/dL (ref 6–23)
CO2: 30 meq/L (ref 19–32)
Calcium: 9.4 mg/dL (ref 8.4–10.5)
Chloride: 103 meq/L (ref 96–112)
Creatinine, Ser: 0.93 mg/dL (ref 0.40–1.20)
GFR: 65.07 mL/min (ref >60.00)
Glucose, Bld: 94 mg/dL (ref 70–99)
Potassium: 3.7 meq/L (ref 3.5–5.1)
Sodium: 140 meq/L (ref 135–145)
Total Bilirubin: 0.7 mg/dL (ref 0.2–1.2)
Total Protein: 7.1 g/dL (ref 6.0–8.3)

## 2023-04-17 LAB — LIPID PANEL
Cholesterol: 141 mg/dL (ref 0–200)
HDL: 61.1 mg/dL (ref >39.00)
LDL Cholesterol: 66 mg/dL (ref 0–99)
NonHDL: 79.57
Total CHOL/HDL Ratio: 2
Triglycerides: 68 mg/dL (ref 0.0–149.0)
VLDL: 13.6 mg/dL (ref 0.0–40.0)

## 2023-04-17 LAB — HEMOGLOBIN A1C: Hgb A1c MFr Bld: 5.8 % (ref 4.6–6.5)

## 2023-04-17 LAB — LDL CHOLESTEROL, DIRECT: Direct LDL: 62 mg/dL

## 2023-04-17 NOTE — Assessment & Plan Note (Signed)
;   intervention  has been  deferred but given current elevations in BP will recommend repeat doppler

## 2023-04-17 NOTE — Assessment & Plan Note (Addendum)
Elevated for the past 24 hours.  I have ordered and reviewed a 12 lead EKG and find that there are no acute changes and patient is in sinus rhythm.   Recommend scheduled doses of hydralazine 50 mg tid   continue prn use of amlodipine and stop weaning the alprazolam as the elvations may be anxiety provoked.

## 2023-04-17 NOTE — Assessment & Plan Note (Signed)
Currently well-controlled on current medications .  hemoglobin A1c is at goal of less than 7.0 . Patient is reminded to schedule an annual eye exam and foot exam is normal today. Patient has  microalbuminuria. Patient is tolerating statin therapy for CAD risk reduction and on ACE/ARB for renal protection and hypertension    Lab Results  Component Value Date   HGBA1C 5.7 (A) 12/30/2022   Lab Results  Component Value Date   MICROALBUR 10.2 (H) 07/08/2022   MICROALBUR 6.2 (H) 12/18/2020

## 2023-04-17 NOTE — Assessment & Plan Note (Signed)
S/p left RA stent .  Normal readings march 2024 doppler

## 2023-04-22 ENCOUNTER — Other Ambulatory Visit: Payer: Self-pay

## 2023-04-26 DIAGNOSIS — Z1211 Encounter for screening for malignant neoplasm of colon: Secondary | ICD-10-CM | POA: Diagnosis not present

## 2023-05-03 LAB — COLOGUARD: COLOGUARD: NEGATIVE

## 2023-05-04 ENCOUNTER — Other Ambulatory Visit: Payer: Self-pay | Admitting: Internal Medicine

## 2023-05-04 ENCOUNTER — Other Ambulatory Visit: Payer: Self-pay

## 2023-05-05 ENCOUNTER — Other Ambulatory Visit: Payer: Self-pay | Admitting: Internal Medicine

## 2023-05-05 ENCOUNTER — Other Ambulatory Visit: Payer: Self-pay

## 2023-05-06 ENCOUNTER — Other Ambulatory Visit: Payer: Self-pay

## 2023-05-08 ENCOUNTER — Other Ambulatory Visit: Payer: Self-pay

## 2023-05-08 MED FILL — Tramadol HCl Tab 50 MG: ORAL | 23 days supply | Qty: 180 | Fill #0 | Status: AC

## 2023-05-09 ENCOUNTER — Ambulatory Visit: Payer: 59 | Admitting: Cardiovascular Disease

## 2023-05-09 NOTE — Progress Notes (Deleted)
Cardiology Office Note   Date:  05/09/2023   ID:  Courtney Lopez, DOB 28-Nov-1960, MRN [id number]  PCP:  Sherlene Shams, MD  Cardiologist:  Dr. Mariah Milling  No chief complaint on file.      History of Present Illness: Courtney Lopez is a 62 y.o. female who is here today for a follow-up visit regarding renal artery stenosis.  She has known history of bicuspid aortic valve with ascending aortic aneurysm, renal artery stenosis, hyperlipidemia and type 2 diabetes. She had CTA of the aorta in 2020 which showed evidence of significant left renal artery stenosis with lack of significant calcifications. This was suggestive of fibromuscular dysplasia. Left renal artery stenosis was confirmed with renal artery duplex. Repeat renal artery duplex in 2021 showed significant stenosis in the mid left renal artery with peak velocity of 439.  Carotid Doppler showed no significant disease.   Blood pressure was initially controlled with medications with stable renal function and thus renal artery angiography was not pursued.   However, she had worsening renal function and blood pressure control in late 2022. I proceeded with angiography in November 2022 which showed 95% stenosis in the proximal left renal artery with no significant disease affecting the right renal artery.  I performed successful stent placement to the left renal artery .  She underwent replacement of ascending aorta and aortic valve replacement with pericardial valve in October of 2023.  Postoperative course was complicated by bilateral pleural effusions requiring thoracentesis.  Cardiac catheterization before surgery showed minimal nonobstructive coronary artery disease.  She has been doing extremely well with no chest pain or shortness of breath.  She had 1 episode of dizziness but overall she feels much better than before and has resumed work.  Her blood pressure is overall controlled but she has occasional spikes.  Past  Medical History:  Diagnosis Date   Ankle fracture 12/31/2018   left   Anxiety    Arthritis    neck, back, ankle, knees   Ascending aortic aneurysm (HCC)    a. 04/2016 CTA: 4.6cm Asc TAA; b. 04/2018 CTA: 4.8cm; c. 10/2018 CTA: 4.9x4.8cm; d. 12/2019 Echo: 4.8 cm.   Bicuspid aortic valve    Breast screening, unspecified    Carotid arterial disease (HCC)    a. 01/2020 U/S: 1-39% bilat ICA stenosis. No evidence of FMD.   Cervicalgia    cervical stenosis   Diabetes mellitus without complication (HCC)    Elevated coronary artery calcium score    a. 03/2016 Cardiac CT: Ca2+ = 78 (90th %'ile).   Fibromuscular dysplasia (HCC)    Heart murmur    History of hiatal hernia    Metatarsal fracture 2007   5th, from wearing high heels   Moderate aortic stenosis    a. 12/2019 Echo: EF 60-65%, no rwma, mild LVH, gr1 DD, nl RV size/fxn, triv MR, mild AI, mod AS, mod-sev dil of Asc Ao - 48mm.  AoV reported as tricuspid but prev known to be bicuspid.   Multiple thyroid nodules    a. 10/2019 U/S: R mid thyroid nodule - f/u 5 yrs.   Other symptoms involving abdomen and pelvis(789.9)    Renal artery stenosis (HCC)    a. 12/2019 Renal duplex: Nl RRA. LRA >60% stenosis.   Renal cyst, right    a. 12/2019 Duplex: 5.8 x 5.7 cm R renal cyst in lower pole.   Retinal hemorrhage    Symptomatic menopausal or female climacteric states    Unspecified essential  hypertension    Wears contact lenses     Past Surgical History:  Procedure Laterality Date   ABDOMINAL HYSTERECTOMY     ANKLE ARTHROSCOPY WITH RECONSTRUCTION Left 08/24/2020   Procedure: LEFT ANKLE ARTHROSCOPIC DEBRIDEMENT, EXTENSIVE, ARTHOSCROPIC TREATMENT OF TALUS OSTEOCHONDRAL LESION, LATERAL LIGAMENT RECONSTRUCTION;  Surgeon: Terance Hart, MD;  Location: [facility];  Service: Orthopedics;  Laterality: Left;   AORTIC VALVE REPLACEMENT N/A 02/14/2022   Procedure: AORTIC VALVE REPLACEMENT (AVR) USING EDWARDS INSPIRIS 23 MM RESILIA AORTIC  VALVE;  Surgeon: Alleen Borne, MD;  Location: [facility] OR;  Service: Open Heart Surgery;  Laterality: N/A;   APPENDECTOMY     done during oophorectomy   BENTALL PROCEDURE N/A 02/14/2022   Procedure: possible BENTALL PROCEDURE;  Surgeon: Alleen Borne, MD;  Location: [facility] OR;  Service: Open Heart Surgery;  Laterality: N/A;   CESAREAN SECTION     x 2   ETHMOIDECTOMY Left 03/02/2020   Procedure: ETHMOIDECTOMY;  Surgeon: Vernie Murders, MD;  Location: [facility];  Service: ENT;  Laterality: Left;   gyn surgery     hysterectomy- done for adenomysosis and endometriosis   IMAGE GUIDED SINUS SURGERY N/A 03/02/2020   Procedure: IMAGE GUIDED SINUS SURGERY;  Surgeon: Vernie Murders, MD;  Location: [facility];  Service: ENT;  Laterality: N/A;  need disk PLACED DISK ON OR CHARGE NURSE DESK 9-17 KP   IMAGE GUIDED SINUS SURGERY Left 04/13/2020   Procedure: IMAGE GUIDED SINUS SURGERY;  Surgeon: Vernie Murders, MD;  Location: [facility];  Service: ENT;  Laterality: Left;  needs [employer] disk per Gloriajean Dell use same disk as before (October) put on charge nurses desk. DS 04/03/20   IR THORACENTESIS ASP PLEURAL SPACE W/IMG GUIDE  03/08/2022   IR THORACENTESIS ASP PLEURAL SPACE W/IMG GUIDE  04/15/2022   MAXILLARY ANTROSTOMY Left 03/02/2020   Procedure: MAXILLARY ANTROSTOMY;  Surgeon: Vernie Murders, MD;  Location: [facility];  Service: ENT;  Laterality: Left;   MAXILLARY ANTROSTOMY Left 04/13/2020   Procedure: MAXILLECTOMY;  Surgeon: Vernie Murders, MD;  Location: [facility];  Service: ENT;  Laterality: Left;   OOPHORECTOMY     unilateral, due to ruptured ovarian cysts   OSTEOTOMY MAXILLARY     due to bite problems   PERIPHERAL VASCULAR INTERVENTION  03/28/2021   Procedure: PERIPHERAL VASCULAR INTERVENTION;  Surgeon: Iran Ouch, MD;  Location: [facility];  Service: Cardiovascular;;   RENAL ANGIOGRAPHY N/A 03/28/2021   Procedure: RENAL ANGIOGRAPHY;  Surgeon: Iran Ouch, MD;  Location: [facility];  Service: Cardiovascular;  Laterality: N/A;   REPLACEMENT ASCENDING AORTA N/A 02/14/2022   Procedure: REPLACEMENT ASCENDING AORTIC ANEURYSM HEMASHIELD PLATINUM 30 MM GRAFT;  Surgeon: Alleen Borne, MD;  Location: [facility];  Service: Open Heart Surgery;  Laterality: N/A;  CIRC ARREST   RIGHT/LEFT HEART CATH AND CORONARY ANGIOGRAPHY Bilateral 01/28/2022   Procedure: RIGHT/LEFT HEART CATH AND CORONARY ANGIOGRAPHY;  Surgeon: Iran Ouch, MD;  Location: [facility];  Service: Cardiovascular;  Laterality: Bilateral;   TEE WITHOUT CARDIOVERSION N/A 02/14/2022   Procedure: TRANSESOPHAGEAL ECHOCARDIOGRAM (TEE);  Surgeon: Alleen Borne, MD;  Location: [facility] OR;  Service: Open Heart Surgery;  Laterality: N/A;   TUBAL LIGATION       Current Outpatient Medications  Medication Sig Dispense Refill   acetaminophen (TYLENOL) 325 MG tablet Take 2 tablets (650 mg total) by mouth every 6 (six) hours as needed for mild pain.  ALPRAZolam (XANAX) 0.25 MG tablet Take 1 tablet (0.25 mg total) by mouth 2 (two) times daily as needed. 60 tablet 5   amLODipine (NORVASC) 5 MG tablet Take 1 tablet (5 mg total) by mouth daily. 90 tablet 1   amoxicillin (AMOXIL) 500 MG capsule Take 2,000 mg by mouth See admin instructions. Take 2,000 mg by mouth one hour prior to dental procedures     aspirin EC 81 MG tablet Take 1 tablet (81 mg total) by mouth daily. Swallow whole. 30 tablet 12   blood glucose meter kit and supplies KIT Dispense based on patient and insurance preference. Use up to four times daily as directed. 1 each 0   carvedilol (COREG) 25 MG tablet Take 1 tablet (25 mg total) by mouth 2 (two) times daily with a meal. 180 tablet 1   DULoxetine (CYMBALTA) 20 MG capsule Take 1 capsule (20 mg total) by mouth daily. 90 capsule 1   estradiol (ESTRACE) 0.1 MG/GM vaginal cream Place 0.25 Applicatorfuls vaginally 2 (two) times a week. 42.5 g 2   fluticasone (FLONASE) 50  MCG/ACT nasal spray Place 2 sprays into both nostrils daily. 16 g 3   furosemide (LASIX) 20 MG tablet Take 1 tablet (20 mg total) by mouth as needed for weight gain of 2 lbs overnight 30 tablet 3   glucose blood test strip Use up to 4 times a day as directed 100 each 0   hydrALAZINE (APRESOLINE) 50 MG tablet Take 1 tablet (50 mg total) by mouth 3 (three) times daily. 90 tablet 1   HYDROcodone-acetaminophen (NORCO) 10-325 MG tablet Take 1 tablet by mouth daily as needed. 30 tablet 0   Menthol, Topical Analgesic, (BIOFREEZE EX) Apply 1 application  topically 3 (three) times daily as needed (neck pain).     metFORMIN (GLUCOPHAGE-XR) 750 MG 24 hr tablet Take 1 tablet (750 mg total) by mouth daily with breakfast. 90 tablet 2   rosuvastatin (CRESTOR) 5 MG tablet Take 1 tablet (5 mg total) by mouth at bedtime. 90 tablet 2   Semaglutide, 1 MG/DOSE, 4 MG/3ML SOPN Inject 1 mg as directed once a week. 3 mL 2   sodium fluoride (PREVIDENT 5000 PLUS) 1.1 % CREA dental cream Brush teeth thoroughly for 2 minutes, do not rinse afterwards 51 g 12   sodium fluoride (SF 5000 PLUS) 1.1 % CREA dental cream Brush teeth thoroughly for 2 minutes, do not rinse afterwards. 51 g 12   telmisartan (MICARDIS) 80 MG tablet Take 1 tablet (80 mg total) by mouth daily. 90 tablet 1   traMADol (ULTRAM) 50 MG tablet Take 2 tablets (100 mg total) by mouth every 6 (six) hours as needed (for pain). 180 tablet 5   No current facility-administered medications for this visit.    Allergies:   Hctz [hydrochlorothiazide] and Prozac [fluoxetine hcl]    Social History:  The patient  reports that she has never smoked. She has never used smokeless tobacco. She reports that she does not currently use alcohol. She reports that she does not use drugs.   Family History:  The patient's family history includes Aortic stenosis in her mother; Breast cancer (age of onset: 73) in her maternal aunt; Cancer in her father, granddaughter, and maternal aunt;  Cancer (age of onset: 1) in her grandchild; Diabetes in her mother; Melanoma in her son; Retinoblastoma in her son.    ROS:  Please see the history of present illness.   Otherwise, review of systems are positive for none.  All other systems are reviewed and negative.    PHYSICAL EXAM: VS:  LMP  (LMP Unknown)  , BMI There is no height or weight on file to calculate BMI. GEN: Well nourished, well developed, in no acute distress  HEENT: normal  Neck: no JVD, carotid bruits, or masses Cardiac: RRR; no  rubs, or gallops,no edema . 2/ 6 systolic murmur in the aortic area Respiratory:  clear to auscultation bilaterally, normal work of breathing GI: soft, nontender, nondistended, + BS MS: no deformity or atrophy  Skin: warm and dry, no rash Neuro:  Strength and sensation are intact Psych: euthymic mood, full affect    EKG:  EKG is ordered today. EKG showed normal sinus rhythm with possible left atrial enlargement, LVH with repolarization abnormalities.   Recent Labs: 09/27/2022: Hemoglobin 12.7; Platelets 266 04/16/2023: ALT 13; BUN 13; Creatinine, Ser 0.93; Potassium 3.7; Sodium 140    Lipid Panel    Component Value Date/Time   CHOL 141 04/16/2023 1608   CHOL 132 09/21/2021 1418   CHOL 175 02/17/2013 0746   TRIG 68.0 04/16/2023 1608   TRIG 100 02/17/2013 0746   HDL 61.10 04/16/2023 1608   HDL 62 09/21/2021 1418   HDL 53 02/17/2013 0746   CHOLHDL 2 04/16/2023 1608   VLDL 13.6 04/16/2023 1608   VLDL 20 02/17/2013 0746   LDLCALC 66 04/16/2023 1608   LDLCALC 57 09/21/2021 1418   LDLCALC 102 (H) 02/17/2013 0746   LDLDIRECT 62.0 04/16/2023 1608      Wt Readings from Last 3 Encounters:  04/16/23 162 lb (73.5 kg)  12/30/22 166 lb 9.6 oz (75.6 kg)  11/08/22 166 lb (75.3 kg)           No data to display            ASSESSMENT AND PLAN:  1. Renal artery stenosis: Status post angioplasty and stent placement to the left renal artery for severe proximal stenosis.  Most  recent Doppler in March showed patent stent.  Repeat study next year. She has spikes of elevated blood pressure and she takes amlodipine as needed.  On average she takes this once or twice per week.  I asked her to continue monitoring her blood pressure we could consider adding amlodipine 2.5 mg once daily on a regular basis if her blood pressure is consistently high.  2.  Bicuspid aortic valve with ascending aortic aneurysm.  Status post surgical repair in October.  Significant improvement in dizziness and shortness of breath.  Will get a follow-up echocardiogram in 2 to 3 years.  3.  Hyperlipidemia: Currently on rosuvastatin 5 mg once daily.  Most recent lipid profile showed an LDL of 57.  4.  Recurrent right pleural effusion postsurgery: This has almost completely resolved.   Disposition:  Follow-up with me in 6 months.  Signed,  Lorine Bears, MD  05/09/2023 7:26 AM    [facility]

## 2023-05-12 ENCOUNTER — Other Ambulatory Visit: Payer: Self-pay

## 2023-05-12 ENCOUNTER — Ambulatory Visit
Admission: RE | Admit: 2023-05-12 | Discharge: 2023-05-12 | Disposition: A | Discharge status: Home / Self Care | Payer: 59 | Source: Ambulatory Visit | Attending: Surgery | Admitting: Surgery

## 2023-05-12 ENCOUNTER — Encounter: Payer: Self-pay | Admitting: Surgery

## 2023-05-12 ENCOUNTER — Ambulatory Visit (INDEPENDENT_AMBULATORY_CARE_PROVIDER_SITE_OTHER): Payer: 59 | Admitting: Surgery

## 2023-05-12 ENCOUNTER — Other Ambulatory Visit: Payer: Self-pay | Admitting: Internal Medicine

## 2023-05-12 VITALS — BP 156/82 | HR 74 | Resp 20 | Wt 164.0 lb

## 2023-05-12 DIAGNOSIS — I7121 Aneurysm of the ascending aorta, without rupture: Secondary | ICD-10-CM

## 2023-05-12 DIAGNOSIS — I10 Essential (primary) hypertension: Secondary | ICD-10-CM

## 2023-05-12 MED ORDER — IOPAMIDOL (ISOVUE-370) INJECTION 76%
200.0000 mL | Freq: Once | INTRAVENOUS | Status: AC | PRN
  Administered 2023-05-12: 75 mL via INTRAVENOUS

## 2023-05-12 MED FILL — Telmisartan Tab 80 MG: ORAL | 90 days supply | Qty: 90 | Fill #0 | Status: CN

## 2023-05-12 NOTE — Progress Notes (Signed)
HPI:  The patient returns today for one year follow-up status post supra-coronary replacement of the ascending aorta under deep hypothermic circulatory arrest and aortic valve replacement using a 23 mm Edwards Inspiris pericardial valve on 02/14/2022.  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  She had a right thoracentesis x 2 with resolution of the pleural effusion.  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  Current Outpatient Medications  Medication Sig Dispense Refill   acetaminophen (TYLENOL) 325 MG tablet Take 2 tablets (650 mg total) by mouth every 6 (six) hours as needed for mild pain.     ALPRAZolam (XANAX) 0.25 MG tablet Take 1 tablet (0.25 mg total) by mouth 2 (two) times daily as needed. 60 tablet 5   amLODipine (NORVASC) 5 MG tablet Take 1 tablet (5 mg total) by mouth daily. (Patient taking differently: Take 5 mg by mouth daily as needed.) 90 tablet 1   amoxicillin (AMOXIL) 500 MG capsule Take 2,000 mg by mouth See admin instructions. Take 2,000 mg by mouth one hour prior to dental procedures     aspirin EC 81 MG tablet Take 1 tablet (81 mg total) by mouth daily. Swallow whole. 30 tablet 12   blood glucose meter kit and supplies KIT Dispense based on patient and insurance preference. Use up to four times daily as directed. 1 each 0   carvedilol (COREG) 25 MG tablet Take 1 tablet (25 mg total) by mouth 2 (two) times daily with a meal. 180 tablet 1   DULoxetine (CYMBALTA) 20 MG capsule Take 1 capsule (20 mg total) by mouth daily. 90 capsule 1   estradiol (ESTRACE) 0.1 MG/GM vaginal cream Place 0.25 Applicatorfuls vaginally 2 (two) times a week. 42.5 g 2   fluticasone (FLONASE) 50 MCG/ACT nasal spray Place 2 sprays into both nostrils daily. 16 g 3   furosemide (LASIX)  20 MG tablet Take 1 tablet (20 mg total) by mouth as needed for weight gain of 2 lbs overnight 30 tablet 3   glucose blood test strip Use up to 4 times a day as directed 100 each 0   hydrALAZINE (APRESOLINE) 50 MG tablet Take 1 tablet (50 mg total) by mouth 3 (three) times daily. (Patient taking differently: Take 50 mg by mouth 3 (three) times daily as needed.) 90 tablet 1   HYDROcodone-acetaminophen (NORCO) 10-325 MG tablet Take 1 tablet by mouth daily as needed. 30 tablet 0   Menthol, Topical Analgesic, (BIOFREEZE EX) Apply 1 application  topically 3 (three) times daily as needed (neck pain).     metFORMIN (GLUCOPHAGE-XR) 750 MG 24 hr tablet Take 1 tablet (750 mg total) by mouth daily with breakfast. 90 tablet 2   rosuvastatin (CRESTOR) 5 MG tablet Take 1 tablet (5 mg total) by mouth at bedtime. 90 tablet 2   Semaglutide, 1 MG/DOSE, 4 MG/3ML SOPN Inject 1 mg as directed once a week. 3 mL 2   sodium fluoride (PREVIDENT 5000 PLUS) 1.1 % CREA dental cream Brush teeth thoroughly for 2 minutes, do not rinse afterwards 51 g 12   sodium fluoride (SF 5000 PLUS) 1.1 % CREA dental cream Brush teeth thoroughly for 2 minutes, do not rinse afterwards. 51 g 12   telmisartan (MICARDIS) 80 MG tablet Take 1 tablet (80 mg total) by mouth  daily. 90 tablet 1   traMADol (ULTRAM) 50 MG tablet Take 2 tablets (100 mg total) by mouth every 6 (six) hours as needed (for pain). 180 tablet 5   No current facility-administered medications for this visit.     Physical Exam: BP (!) 156/82   Pulse 74   Resp 20   Wt 164 lb (74.4 kg)   LMP  (LMP Unknown)   SpO2 99% Comment: RA  BMI 26.47 kg/m  She looks well. Cardiac exam shows a regular rate and rhythm with a 2/6 systolic flow murmur along the right sternal border.  There is no diastolic murmur. Lungs are clear. The chest incision is well-healed and the sternum is stable.  Diagnostic Tests:  Narrative & Impression  CLINICAL DATA:  Follow-up thoracic aortic  aneurysm.   Status post aortic valve replacement.   EXAM: CT ANGIOGRAPHY CHEST WITH CONTRAST   TECHNIQUE: Multidetector CT imaging of the chest was performed using the standard protocol during bolus administration of intravenous contrast. Multiplanar CT image reconstructions and MIPs were obtained to evaluate the vascular anatomy.   RADIATION DOSE REDUCTION: This exam was performed according to the departmental dose-optimization program which includes automated exposure control, adjustment of the mA and/or kV according to patient size and/or use of iterative reconstruction technique.   CONTRAST:  75 mL ISOVUE-370 IOPAMIDOL (ISOVUE-370) INJECTION 76%   COMPARISON:  12/28/2021   FINDINGS: Cardiovascular: Interval aortic valve and ascending thoracic aorta replacement. No recurrent thoracic aortic aneurysm.   Mediastinum/Nodes: Thyroid is diffusely heterogeneous follow-up should be performed for prior thyroid ultrasound, most recently from 04/20/2021.   No enlarged axillary, supraclavicular, mediastinal, hilar lymph nodes.   Lungs/Pleura: No focal consolidative opacity to suggest pneumonia. Multiple 2 mm noncalcified pulmonary nodules located along the subpleural region of the right lower lobe are not significantly changed dating back to at least 04/05/2021 which indicates a benign etiology. This is not require further dedicated imaging follow-up.   Upper Abdomen: Asymmetrically atrophied left kidney partially visualized. Visualized upper abdominal organs otherwise unremarkable.   Musculoskeletal: No chest wall abnormality. No acute or significant osseous findings.   Review of the MIP images confirms the above findings.   IMPRESSION: Interval aortic valve and ascending thoracic aorta replacement. No recurrent thoracic aortic aneurysm.     Electronically Signed   By: Acquanetta Belling M.D.   On: 05/12/2023 12:01      Impression:  She is doing well almost 14 months  out from her surgery.  A follow-up CTA of the chest shows a stable repair of the ascending aortic aneurysm with no evidence of pseudoaneurysm formation or aneurysmal change of the remaining native aorta.  I reviewed the CT images with her and answered all of her questions.  I stressed the importance of continued good blood pressure control and preventing aortic dissection and enlargement of her remaining native aorta.  Plan:  I have recommended that she have a follow-up CTA of the chest in about 5 years for aortic surveillance given her history of aortic aneurysm resection.  I spent 10 minutes performing this established patient evaluation and > 50% of this time was spent face to face counseling and coordinating the care of this patient's aortic aneurysm.    Alleen Borne, MD [facility] [phone]

## 2023-05-15 ENCOUNTER — Other Ambulatory Visit: Payer: Self-pay

## 2023-05-16 ENCOUNTER — Other Ambulatory Visit: Payer: Self-pay

## 2023-05-16 MED FILL — Telmisartan Tab 80 MG: ORAL | 30 days supply | Qty: 30 | Fill #0 | Status: AC

## 2023-05-16 MED FILL — Telmisartan Tab 80 MG: ORAL | 60 days supply | Qty: 60 | Fill #0 | Status: AC

## 2023-05-23 ENCOUNTER — Ambulatory Visit: Payer: 59 | Attending: Cardiovascular Disease | Admitting: Cardiovascular Disease

## 2023-05-23 ENCOUNTER — Encounter: Payer: Self-pay | Admitting: Cardiovascular Disease

## 2023-05-23 VITALS — BP 120/80 | HR 79 | Ht 66.5 in | Wt 161.0 lb

## 2023-05-23 DIAGNOSIS — E785 Hyperlipidemia, unspecified: Secondary | ICD-10-CM | POA: Diagnosis not present

## 2023-05-23 DIAGNOSIS — Q2381 Bicuspid aortic valve: Secondary | ICD-10-CM

## 2023-05-23 DIAGNOSIS — I701 Atherosclerosis of renal artery: Secondary | ICD-10-CM | POA: Diagnosis not present

## 2023-05-23 NOTE — Progress Notes (Signed)
 Cardiology Office Note   Date:  05/23/2023   ID:  Courtney Lopez, DOB 05/20/1960, MRN [id number]  PCP:  Marylynn Verneita CROME, MD  Cardiologist:  Dr. Perla  Chief Complaint  Patient presents with   Follow-up    6 month f/u c/o pt would like to discuss EKG performed at PCP office. Meds reviewed verbally with pt.       History of Present Illness: Courtney Lopez is a 63 y.o. female who is here today for a follow-up visit regarding renal artery stenosis.  She has known history of bicuspid aortic valve with ascending aortic aneurysm, renal artery stenosis, hyperlipidemia and type 2 diabetes. She is status post stenting of the left renal artery in November 2022.  No significant stenosis affecting the right renal artery. She underwent replacement of ascending aorta and aortic valve replacement with pericardial valve in October of 2023.  Postoperative course was complicated by bilateral pleural effusions requiring thoracentesis.  Cardiac catheterization before surgery showed minimal nonobstructive coronary artery disease.  She had significant increase in blood pressure in December in the setting of overworking in the hospital.  She took hydralazine  for few days and then her blood pressure stabilized.  Her blood pressure is now well-controlled with carvedilol  and telmisartan .  She takes amlodipine  and hydralazine  only as needed and has not required them in the last few weeks. She is feeling extremely well with no chest pain or shortness of breath.  Past Medical History:  Diagnosis Date   Ankle fracture 12/31/2018   left   Anxiety    Arthritis    neck, back, ankle, knees   Ascending aortic aneurysm (HCC)    a. 04/2016 CTA: 4.6cm Asc TAA; b. 04/2018 CTA: 4.8cm; c. 10/2018 CTA: 4.9x4.8cm; d. 12/2019 Echo: 4.8 cm.   Bicuspid aortic valve    Breast screening, unspecified    Carotid arterial disease (HCC)    a. 01/2020 U/S: 1-39% bilat ICA stenosis. No evidence of FMD.   Cervicalgia     cervical stenosis   Diabetes mellitus without complication (HCC)    Elevated coronary artery calcium  score    a. 03/2016 Cardiac CT: Ca2+ = 78 (90th %'ile).   Fibromuscular dysplasia (HCC)    Heart murmur    History of hiatal hernia    Metatarsal fracture 2007   5th, from wearing high heels   Moderate aortic stenosis    a. 12/2019 Echo: EF 60-65%, no rwma, mild LVH, gr1 DD, nl RV size/fxn, triv MR, mild AI, mod AS, mod-sev dil of Asc Ao - 48mm.  AoV reported as tricuspid but prev known to be bicuspid.   Multiple thyroid  nodules    a. 10/2019 U/S: R mid thyroid  nodule - f/u 5 yrs.   Other symptoms involving abdomen and pelvis(789.9)    Renal artery stenosis (HCC)    a. 12/2019 Renal duplex: Nl RRA. LRA >60% stenosis.   Renal cyst, right    a. 12/2019 Duplex: 5.8 x 5.7 cm R renal cyst in lower pole.   Retinal hemorrhage    Symptomatic menopausal or female climacteric states    Unspecified essential hypertension    Wears contact lenses     Past Surgical History:  Procedure Laterality Date   ABDOMINAL HYSTERECTOMY     ANKLE ARTHROSCOPY WITH RECONSTRUCTION Left 08/24/2020   Procedure: LEFT ANKLE ARTHROSCOPIC DEBRIDEMENT, EXTENSIVE, ARTHOSCROPIC TREATMENT OF TALUS OSTEOCHONDRAL LESION, LATERAL LIGAMENT RECONSTRUCTION;  Surgeon: Elsa Lonni SAUNDERS, MD;  Location: [facility];  Service:  Orthopedics;  Laterality: Left;   AORTIC VALVE REPLACEMENT N/A 02/14/2022   Procedure: AORTIC VALVE REPLACEMENT (AVR) USING EDWARDS INSPIRIS 23 MM RESILIA AORTIC VALVE;  Surgeon: Lucas Dorise POUR, MD;  Location: [facility];  Service: Open Heart Surgery;  Laterality: N/A;   APPENDECTOMY     done during oophorectomy   BENTALL PROCEDURE N/A 02/14/2022   Procedure: possible BENTALL PROCEDURE;  Surgeon: Lucas Dorise POUR, MD;  Location: [facility] OR;  Service: Open Heart Surgery;  Laterality: N/A;   CESAREAN SECTION     x 2   ETHMOIDECTOMY Left 03/02/2020   Procedure: ETHMOIDECTOMY;  Surgeon: Edda Mt, MD;   Location: [facility];  Service: ENT;  Laterality: Left;   gyn surgery     hysterectomy- done for adenomysosis and endometriosis   IMAGE GUIDED SINUS SURGERY N/A 03/02/2020   Procedure: IMAGE GUIDED SINUS SURGERY;  Surgeon: Edda Mt, MD;  Location: [facility];  Service: ENT;  Laterality: N/A;  need disk PLACED DISK ON OR CHARGE NURSE DESK 9-17 KP   IMAGE GUIDED SINUS SURGERY Left 04/13/2020   Procedure: IMAGE GUIDED SINUS SURGERY;  Surgeon: Edda Mt, MD;  Location: [facility];  Service: ENT;  Laterality: Left;  needs stryker disk per Flavia use same disk as before (October) put on charge nurses desk. DS 04/03/20   IR THORACENTESIS ASP PLEURAL SPACE W/IMG GUIDE  03/08/2022   IR THORACENTESIS ASP PLEURAL SPACE W/IMG GUIDE  04/15/2022   MAXILLARY ANTROSTOMY Left 03/02/2020   Procedure: MAXILLARY ANTROSTOMY;  Surgeon: Edda Mt, MD;  Location: [facility];  Service: ENT;  Laterality: Left;   MAXILLARY ANTROSTOMY Left 04/13/2020   Procedure: MAXILLECTOMY;  Surgeon: Edda Mt, MD;  Location: [facility];  Service: ENT;  Laterality: Left;   OOPHORECTOMY     unilateral, due to ruptured ovarian cysts   OSTEOTOMY MAXILLARY     due to bite problems   PERIPHERAL VASCULAR INTERVENTION  03/28/2021   Procedure: PERIPHERAL VASCULAR INTERVENTION;  Surgeon: Darron Deatrice LABOR, MD;  Location: [facility];  Service: Cardiovascular;;   RENAL ANGIOGRAPHY N/A 03/28/2021   Procedure: RENAL ANGIOGRAPHY;  Surgeon: Darron Deatrice LABOR, MD;  Location: [facility];  Service: Cardiovascular;  Laterality: N/A;   REPLACEMENT ASCENDING AORTA N/A 02/14/2022   Procedure: REPLACEMENT ASCENDING AORTIC ANEURYSM HEMASHIELD PLATINUM 30 MM GRAFT;  Surgeon: Lucas Dorise POUR, MD;  Location: [facility];  Service: Open Heart Surgery;  Laterality: N/A;  CIRC ARREST   RIGHT/LEFT HEART CATH AND CORONARY ANGIOGRAPHY Bilateral 01/28/2022   Procedure: RIGHT/LEFT HEART CATH AND  CORONARY ANGIOGRAPHY;  Surgeon: Darron Deatrice LABOR, MD;  Location: [facility] IN[facility] CV [facility];  Service: Cardiovascular;  Laterality: Bilateral;   TEE WITHOUT CARDIOVERSION N/A 02/14/2022   Procedure: TRANSESOPHAGEAL ECHOCARDIOGRAM (TEE);  Surgeon: Lucas Dorise POUR, MD;  Location: [facility];  Service: Open Heart Surgery;  Laterality: N/A;   TUBAL LIGATION       Current Outpatient Medications  Medication Sig Dispense Refill   acetaminophen  (TYLENOL ) 325 MG tablet Take 2 tablets (650 mg total) by mouth every 6 (six) hours as needed for mild pain.     ALPRAZolam  (XANAX ) 0.25 MG tablet Take 1 tablet (0.25 mg total) by mouth 2 (two) times daily as needed. (Patient taking differently: Take 0.25 mg by mouth at bedtime as needed.) 60 tablet 5   amLODipine  (NORVASC ) 5 MG tablet Take 1 tablet (5 mg total) by mouth daily. (Patient taking differently: Take 5 mg by mouth daily as needed.)  90 tablet 1   amoxicillin  (AMOXIL ) 500 MG capsule Take 2,000 mg by mouth See admin instructions. Take 2,000 mg by mouth one hour prior to dental procedures     aspirin  EC 81 MG tablet Take 1 tablet (81 mg total) by mouth daily. Swallow whole. 30 tablet 12   blood glucose meter kit and supplies KIT Dispense based on patient and insurance preference. Use up to four times daily as directed. 1 each 0   carvedilol  (COREG ) 25 MG tablet Take 1 tablet (25 mg total) by mouth 2 (two) times daily with a meal. 180 tablet 1   DULoxetine  (CYMBALTA ) 20 MG capsule Take 1 capsule (20 mg total) by mouth daily. 90 capsule 1   estradiol  (ESTRACE ) 0.1 MG/GM vaginal cream Place 0.25 Applicatorfuls vaginally 2 (two) times a week. 42.5 g 2   fluticasone  (FLONASE ) 50 MCG/ACT nasal spray Place 2 sprays into both nostrils daily. 16 g 3   furosemide  (LASIX ) 20 MG tablet Take 1 tablet (20 mg total) by mouth as needed for weight gain of 2 lbs overnight 30 tablet 3   glucose blood test strip Use up to 4 times a day as directed 100 each 0   hydrALAZINE  (APRESOLINE ) 50  MG tablet Take 1 tablet (50 mg total) by mouth 3 (three) times daily. (Patient taking differently: Take 50 mg by mouth 3 (three) times daily as needed.) 90 tablet 1   HYDROcodone -acetaminophen  (NORCO) 10-325 MG tablet Take 1 tablet by mouth daily as needed. 30 tablet 0   Menthol, Topical Analgesic, (BIOFREEZE EX) Apply 1 application  topically 3 (three) times daily as needed (neck pain).     metFORMIN  (GLUCOPHAGE -XR) 750 MG 24 hr tablet Take 1 tablet (750 mg total) by mouth daily with breakfast. 90 tablet 2   rosuvastatin  (CRESTOR ) 5 MG tablet Take 1 tablet (5 mg total) by mouth at bedtime. 90 tablet 2   Semaglutide , 1 MG/DOSE, 4 MG/3ML SOPN Inject 1 mg as directed once a week. 3 mL 2   sodium fluoride  (PREVIDENT 5000 PLUS) 1.1 % CREA dental cream Brush teeth thoroughly for 2 minutes, do not rinse afterwards 51 g 12   telmisartan  (MICARDIS ) 80 MG tablet Take 1 tablet (80 mg total) by mouth daily. 90 tablet 1   traMADol  (ULTRAM ) 50 MG tablet Take 2 tablets (100 mg total) by mouth every 6 (six) hours as needed (for pain). 180 tablet 5   No current facility-administered medications for this visit.    Allergies:   Hctz [hydrochlorothiazide ] and Prozac  [fluoxetine  hcl]    Social History:  The patient  reports that she has never smoked. She has never used smokeless tobacco. She reports that she does not currently use alcohol. She reports that she does not use drugs.   Family History:  The patient's family history includes Aortic stenosis in her mother; Breast cancer (age of onset: 25) in her maternal aunt; Cancer in her father, granddaughter, and maternal aunt; Cancer (age of onset: 1) in her grandchild; Diabetes in her mother; Melanoma in her son; Retinoblastoma in her son.    ROS:  Please see the history of present illness.   Otherwise, review of systems are positive for none.   All other systems are reviewed and negative.    PHYSICAL EXAM: VS:  BP 120/80 (BP Location: Left Arm, Patient  Position: Sitting, Cuff Size: Large)   Pulse 79   Ht 5' 6.5 (1.689 m)   Wt 161 lb (73 kg)   LMP  (  LMP Unknown)   SpO2 98%   BMI 25.60 kg/m  , BMI Body mass index is 25.6 kg/m. GEN: Well nourished, well developed, in no acute distress  HEENT: normal  Neck: no JVD, carotid bruits, or masses Cardiac: RRR; no  rubs, or gallops,no edema . 2/ 6 systolic murmur in the aortic area Respiratory:  clear to auscultation bilaterally, normal work of breathing GI: soft, nontender, nondistended, + BS MS: no deformity or atrophy  Skin: warm and dry, no rash Neuro:  Strength and sensation are intact Psych: euthymic mood, full affect    EKG:  EKG is ordered today. EKG showed : Normal sinus rhythm Left axis deviation Nonspecific T wave abnormality When compared with ECG of 08-Nov-2022 07:59, No significant change was found    Recent Labs: 09/27/2022: Hemoglobin 12.7; Platelets 266 04/16/2023: ALT 13; BUN 13; Creatinine, Ser 0.93; Potassium 3.7; Sodium 140    Lipid Panel    Component Value Date/Time   CHOL 141 04/16/2023 1608   CHOL 132 09/21/2021 1418   CHOL 175 02/17/2013 0746   TRIG 68.0 04/16/2023 1608   TRIG 100 02/17/2013 0746   HDL 61.10 04/16/2023 1608   HDL 62 09/21/2021 1418   HDL 53 02/17/2013 0746   CHOLHDL 2 04/16/2023 1608   VLDL 13.6 04/16/2023 1608   VLDL 20 02/17/2013 0746   LDLCALC 66 04/16/2023 1608   LDLCALC 57 09/21/2021 1418   LDLCALC 102 (H) 02/17/2013 0746   LDLDIRECT 62.0 04/16/2023 1608      Wt Readings from Last 3 Encounters:  05/23/23 161 lb (73 kg)  05/12/23 164 lb (74.4 kg)  04/16/23 162 lb (73.5 kg)           No data to display            ASSESSMENT AND PLAN:  1. Renal artery stenosis: Status post angioplasty and stent placement to the left renal artery for severe proximal stenosis.  I requested a follow-up renal artery duplex to be done in March.  2.  Bicuspid aortic valve with ascending aortic aneurysm.  Status post surgical  repair in October.  Excellent recovery and improvement in exercise capacity.  I will consider repeat echocardiogram in 2026.  3.  Hyperlipidemia: Currently on rosuvastatin  5 mg once daily.  Most recent lipid profile showed an LDL of 62.  4.  Abnormal EKG: She has poor R wave progression in the anterior leads but that is a nonspecific finding and has been present on prior EKGs.  I do not think she had a previous infarct.  Cardiac catheterization in 2023 showed minimal nonobstructive coronary artery disease.   Disposition:  Follow-up with me in 12 months.  Signed,  Deatrice Cage, MD  05/23/2023 10:44 AM    [facility]

## 2023-05-23 NOTE — Patient Instructions (Signed)
 Medication Instructions:  No changes *If you need a refill on your cardiac medications before your next appointment, please call your pharmacy*   Lab Work: None ordered If you have labs (blood work) drawn today and your tests are completely normal, you will receive your results only by: MyChart Message (if you have MyChart) OR A paper copy in the mail If you have any lab test that is abnormal or we need to change your treatment, we will call you to review the results.   Testing/Procedures: Your physician has requested that you have a renal artery duplex. During this test, an ultrasound is used to evaluate blood flow to the kidneys. Take your medications as you usually do. This will take place at [address] [facility]) [address], [place] [address].  No food after 11PM the night before.  Water is OK. (Don't drink liquids if you have been instructed not to for ANOTHER test). Avoid foods that produce bowel gas, for 24 hours prior to exam (see below). No breakfast, no chewing gum, no smoking or carbonated beverages. Patient may take morning medications with water. Come in for test at least 15 minutes early to register.  Please note: We ask at that you not bring children with you during ultrasound (echo/ vascular) testing. Due to room size and safety concerns, children are not allowed in the ultrasound rooms during exams. Our front office staff cannot provide observation of children in our lobby area while testing is being conducted. An adult accompanying a patient to their appointment will only be allowed in the ultrasound room at the discretion of the ultrasound technician under special circumstances. We apologize for any inconvenience.    Follow-Up: At [facility], you and your health needs are our priority.  As part of our continuing mission to provide you with exceptional heart care, we have created designated Provider Care Teams.  These Care Teams include your  primary Cardiologist (physician) and Advanced Practice Providers (APPs -  Physician Assistants and Nurse Practitioners) who all work together to provide you with the care you need, when you need it.  We recommend signing up for the patient portal called MyChart.  Sign up information is provided on this After Visit Summary.  MyChart is used to connect with patients for Virtual Visits (Telemedicine).  Patients are able to view lab/test results, encounter notes, upcoming appointments, etc.  Non-urgent messages can be sent to your provider as well.   To learn more about what you can do with MyChart, go to [email or web address].    Your next appointment:   12 month(s)  Provider:   You may see Deatrice Cage, MD or one of the following Advanced Practice Providers on your designated Care Team:   Lonni Meager, NP Bernardino Bring, PA-C Cadence Franchester, PA-C Tylene Lunch, NP Barnie Hila, NP

## 2023-06-04 ENCOUNTER — Other Ambulatory Visit: Payer: Self-pay

## 2023-06-04 ENCOUNTER — Other Ambulatory Visit: Payer: Self-pay | Admitting: Internal Medicine

## 2023-06-04 MED ORDER — CARVEDILOL 25 MG PO TABS
25.0000 mg | ORAL_TABLET | Freq: Two times a day (BID) | ORAL | 1 refills | Status: DC
  Filled 2023-06-04: qty 180, 90d supply, fill #0
  Filled 2023-08-31: qty 180, 90d supply, fill #1

## 2023-06-11 ENCOUNTER — Other Ambulatory Visit: Payer: Self-pay | Admitting: Cardiovascular Disease

## 2023-06-11 MED FILL — Tramadol HCl Tab 50 MG: ORAL | 23 days supply | Qty: 180 | Fill #1 | Status: AC

## 2023-06-12 ENCOUNTER — Other Ambulatory Visit: Payer: Self-pay

## 2023-06-12 MED ORDER — ROSUVASTATIN CALCIUM 5 MG PO TABS
5.0000 mg | ORAL_TABLET | Freq: Every day | ORAL | 3 refills | Status: AC
  Filled 2023-06-12: qty 90, 90d supply, fill #0
  Filled 2023-09-21: qty 90, 90d supply, fill #1
  Filled 2024-02-12: qty 90, 90d supply, fill #2

## 2023-06-13 ENCOUNTER — Other Ambulatory Visit: Payer: Self-pay

## 2023-06-13 ENCOUNTER — Other Ambulatory Visit: Payer: Self-pay | Admitting: Internal Medicine

## 2023-06-13 DIAGNOSIS — J069 Acute upper respiratory infection, unspecified: Secondary | ICD-10-CM

## 2023-06-13 MED ORDER — FLUTICASONE PROPIONATE 50 MCG/ACT NA SUSP
2.0000 | Freq: Every day | NASAL | 3 refills | Status: DC
  Filled 2023-06-13: qty 16, 30d supply, fill #0
  Filled 2023-07-23: qty 16, 30d supply, fill #1
  Filled 2023-09-01: qty 16, 30d supply, fill #2
  Filled 2023-10-14: qty 16, 30d supply, fill #3

## 2023-06-16 ENCOUNTER — Other Ambulatory Visit: Payer: Self-pay

## 2023-06-17 ENCOUNTER — Other Ambulatory Visit: Payer: Self-pay | Admitting: Internal Medicine

## 2023-06-17 DIAGNOSIS — F411 Generalized anxiety disorder: Secondary | ICD-10-CM

## 2023-06-18 ENCOUNTER — Other Ambulatory Visit: Payer: Self-pay

## 2023-06-18 MED ORDER — DULOXETINE HCL 20 MG PO CPEP
20.0000 mg | ORAL_CAPSULE | Freq: Every day | ORAL | 1 refills | Status: DC
  Filled 2023-06-18: qty 90, 90d supply, fill #0
  Filled 2023-09-14: qty 90, 90d supply, fill #1

## 2023-07-13 MED FILL — Tramadol HCl Tab 50 MG: ORAL | 23 days supply | Qty: 180 | Fill #2 | Status: AC

## 2023-07-14 ENCOUNTER — Other Ambulatory Visit: Payer: Self-pay

## 2023-07-15 ENCOUNTER — Other Ambulatory Visit: Payer: Self-pay

## 2023-07-16 ENCOUNTER — Other Ambulatory Visit: Payer: Self-pay

## 2023-07-17 ENCOUNTER — Other Ambulatory Visit: Payer: Self-pay

## 2023-07-17 MED ORDER — SODIUM FLUORIDE 1.1 % DT CREA
TOPICAL_CREAM | DENTAL | 12 refills | Status: DC
Start: 2023-07-17 — End: 2024-01-14
  Filled 2023-07-17: qty 51, 30d supply, fill #0

## 2023-07-21 ENCOUNTER — Telehealth: Admitting: Physician Assistant

## 2023-07-21 DIAGNOSIS — R399 Unspecified symptoms and signs involving the genitourinary system: Secondary | ICD-10-CM | POA: Diagnosis not present

## 2023-07-21 MED ORDER — NITROFURANTOIN MONOHYD MACRO 100 MG PO CAPS: 100.0000 mg | ORAL_CAPSULE | Freq: Two times a day (BID) | ORAL | 0 refills | Status: AC

## 2023-07-21 NOTE — Progress Notes (Signed)
E-Visit for Urinary Problems  We are sorry that you are not feeling well.  Here is how we plan to help!  Based on what you shared with me it looks like you most likely have a simple urinary tract infection.  A UTI (Urinary Tract Infection) is a bacterial infection of the bladder.  Most cases of urinary tract infections are simple to treat but a key part of your care is to encourage you to drink plenty of fluids and watch your symptoms carefully.  I have prescribed MacroBid 100 mg twice a day for 5 days.  Your symptoms should gradually improve. Call us if the burning in your urine worsens, you develop worsening fever, back pain or pelvic pain or if your symptoms do not resolve after completing the antibiotic.  Urinary tract infections can be prevented by drinking plenty of water to keep your body hydrated.  Also be sure when you wipe, wipe from front to back and don't hold it in!  If possible, empty your bladder every 4 hours.  HOME CARE Drink plenty of fluids Compete the full course of the antibiotics even if the symptoms resolve Remember, when you need to go.go. Holding in your urine can increase the likelihood of getting a UTI! GET HELP RIGHT AWAY IF: You cannot urinate You get a high fever Worsening back pain occurs You see blood in your urine You feel sick to your stomach or throw up You feel like you are going to pass out  MAKE SURE YOU  Understand these instructions. Will watch your condition. Will get help right away if you are not doing well or get worse.   Thank you for choosing an e-visit.  Your e-visit answers were reviewed by a board certified advanced clinical practitioner to complete your personal care plan. Depending upon the condition, your plan could have included both over the counter or prescription medications.  Please review your pharmacy choice. Make sure the pharmacy is open so you can pick up prescription now. If there is a problem, you may contact your  provider through MyChart messaging and have the prescription routed to another pharmacy.  Your safety is important to us. If you have drug allergies check your prescription carefully.   For the next 24 hours you can use MyChart to ask questions about today's visit, request a non-urgent call back, or ask for a work or school excuse. You will get an email in the next two days asking about your experience. I hope that your e-visit has been valuable and will speed your recovery.   I have spent 5 minutes in review of e-visit questionnaire, review and updating patient chart, medical decision making and response to patient.   Adea Geisel Z Ward, PA-C    

## 2023-07-23 ENCOUNTER — Encounter: Payer: Self-pay | Admitting: Internal Medicine

## 2023-07-23 ENCOUNTER — Other Ambulatory Visit: Payer: Self-pay

## 2023-07-23 ENCOUNTER — Ambulatory Visit: Payer: 59 | Admitting: Internal Medicine

## 2023-07-23 ENCOUNTER — Other Ambulatory Visit: Payer: Self-pay | Admitting: Internal Medicine

## 2023-07-23 VITALS — BP 124/78 | HR 70 | Ht 66.5 in | Wt 157.4 lb

## 2023-07-23 DIAGNOSIS — I10 Essential (primary) hypertension: Secondary | ICD-10-CM

## 2023-07-23 DIAGNOSIS — E1169 Type 2 diabetes mellitus with other specified complication: Secondary | ICD-10-CM | POA: Diagnosis not present

## 2023-07-23 DIAGNOSIS — R5383 Other fatigue: Secondary | ICD-10-CM

## 2023-07-23 DIAGNOSIS — Z7985 Long-term (current) use of injectable non-insulin antidiabetic drugs: Secondary | ICD-10-CM | POA: Diagnosis not present

## 2023-07-23 DIAGNOSIS — N3000 Acute cystitis without hematuria: Secondary | ICD-10-CM

## 2023-07-23 DIAGNOSIS — Z1231 Encounter for screening mammogram for malignant neoplasm of breast: Secondary | ICD-10-CM

## 2023-07-23 DIAGNOSIS — E041 Nontoxic single thyroid nodule: Secondary | ICD-10-CM | POA: Diagnosis not present

## 2023-07-23 DIAGNOSIS — E1121 Type 2 diabetes mellitus with diabetic nephropathy: Secondary | ICD-10-CM

## 2023-07-23 DIAGNOSIS — M489 Spondylopathy, unspecified: Secondary | ICD-10-CM

## 2023-07-23 DIAGNOSIS — E782 Mixed hyperlipidemia: Secondary | ICD-10-CM

## 2023-07-23 LAB — COMPREHENSIVE METABOLIC PANEL
ALT: 14 U/L (ref 0–35)
AST: 21 U/L (ref 0–37)
Albumin: 4.5 g/dL (ref 3.5–5.2)
Alkaline Phosphatase: 64 U/L (ref 39–117)
BUN: 14 mg/dL (ref 6–23)
CO2: 31 meq/L (ref 19–32)
Calcium: 9.6 mg/dL (ref 8.4–10.5)
Chloride: 99 meq/L (ref 96–112)
Creatinine, Ser: 1.02 mg/dL (ref 0.40–1.20)
GFR: 58.13 mL/min — ABNORMAL LOW (ref >60.00)
Glucose, Bld: 83 mg/dL (ref 70–99)
Potassium: 4 meq/L (ref 3.5–5.1)
Sodium: 139 meq/L (ref 135–145)
Total Bilirubin: 0.6 mg/dL (ref 0.2–1.2)
Total Protein: 7 g/dL (ref 6.0–8.3)

## 2023-07-23 LAB — MICROALBUMIN / CREATININE URINE RATIO
Creatinine,U: 119.8 mg/dL
Microalb Creat Ratio: 197 mg/g — ABNORMAL HIGH (ref 0.0–30.0)
Microalb, Ur: 23.6 mg/dL — ABNORMAL HIGH (ref 0.0–1.9)

## 2023-07-23 LAB — LIPID PANEL
Cholesterol: 110 mg/dL (ref 0–200)
HDL: 54 mg/dL (ref >39.00)
LDL Cholesterol: 33 mg/dL (ref 0–99)
NonHDL: 56.06
Total CHOL/HDL Ratio: 2
Triglycerides: 114 mg/dL (ref 0.0–149.0)
VLDL: 22.8 mg/dL (ref 0.0–40.0)

## 2023-07-23 LAB — HEMOGLOBIN A1C: Hgb A1c MFr Bld: 5.6 % (ref 4.6–6.5)

## 2023-07-23 LAB — LDL CHOLESTEROL, DIRECT: Direct LDL: 38 mg/dL

## 2023-07-23 MED ORDER — SEMAGLUTIDE (1 MG/DOSE) 4 MG/3ML ~~LOC~~ SOPN
1.0000 mg | PEN_INJECTOR | SUBCUTANEOUS | 2 refills | Status: DC
  Filled 2023-07-23: qty 3, 28d supply, fill #0
  Filled 2023-08-31: qty 3, 28d supply, fill #1
  Filled 2023-10-14: qty 3, 28d supply, fill #2

## 2023-07-23 MED ORDER — TRAMADOL HCL 50 MG PO TABS
100.0000 mg | ORAL_TABLET | Freq: Four times a day (QID) | ORAL | 5 refills | Status: DC | PRN
  Filled 2023-07-23 – 2023-08-13 (×2): qty 240, 30d supply, fill #0
  Filled 2023-09-21: qty 240, 30d supply, fill #1
  Filled 2023-10-24: qty 240, 30d supply, fill #2
  Filled 2023-12-10: qty 240, 30d supply, fill #3

## 2023-07-23 MED ORDER — ESTRADIOL 0.1 MG/GM VA CREA
0.2500 | TOPICAL_CREAM | VAGINAL | 2 refills | Status: DC
  Filled 2023-07-23: qty 42.5, 90d supply, fill #0
  Filled 2023-11-03: qty 42.5, 90d supply, fill #1
  Filled 2024-02-13: qty 42.5, 90d supply, fill #2

## 2023-07-23 MED ORDER — ONETOUCH ULTRA VI STRP: ORAL_STRIP | 0 refills | Status: AC

## 2023-07-23 NOTE — Progress Notes (Signed)
 Subjective:  Patient ID: Courtney Lopez, female    DOB: April 28, 1961  Age: 63 y.o. MRN: [id number]  CC: The primary encounter diagnosis was Encounter for screening mammogram for malignant neoplasm of breast. Diagnoses of Primary hypertension, Hyperlipidemia associated with type 2 diabetes mellitus (HCC), DM type 2 with diabetic mixed hyperlipidemia (HCC), White coat syndrome with diagnosis of hypertension, Cervical spine disease, Acute cystitis without hematuria, Thyroid nodule, Long-term current use of injectable noninsulin antidiabetic medication, and Caregiver with fatigue were also pertinent to this visit.   HPI Courtney Lopez presents for  Chief Complaint  Patient presents with   Medical Management of Chronic Issues    Follow up on blood pressure   Hypertension: with RAS s/p left RA stene\t Nov 2022   patient checks blood pressure twice weekly at home.  Readings have been for the most part <130/80 at rest , 124/78 today at home on a calibrated machine . Patient is following a Reduced salt diet most days and is taking medications as prescribed : telmisartan and coreg at max doses.   Amlodipine and hydralazine as needed  TAKING MACOBRID  FOR UTI prescribed VIA E VISIT  .  STARTED IT YESTERDAY FOR DYSURIA AND POSITIVE HOME TEST    Outpatient Medications Prior to Visit  Medication Sig Dispense Refill   acetaminophen (TYLENOL) 325 MG tablet Take 2 tablets (650 mg total) by mouth every 6 (six) hours as needed for mild pain.     ALPRAZolam (XANAX) 0.25 MG tablet Take 1 tablet (0.25 mg total) by mouth 2 (two) times daily as needed. (Patient taking differently: Take 0.25 mg by mouth at bedtime as needed.) 60 tablet 5   amLODipine (NORVASC) 5 MG tablet Take 1 tablet (5 mg total) by mouth daily. (Patient taking differently: Take 5 mg by mouth daily as needed.) 90 tablet 1   aspirin EC 81 MG tablet Take 1 tablet (81 mg total) by mouth daily. Swallow whole. 30 tablet 12   blood glucose  meter kit and supplies KIT Dispense based on patient and insurance preference. Use up to four times daily as directed. 1 each 0   carvedilol (COREG) 25 MG tablet Take 1 tablet (25 mg total) by mouth 2 (two) times daily with a meal. 180 tablet 1   DULoxetine (CYMBALTA) 20 MG capsule Take 1 capsule (20 mg total) by mouth daily. 90 capsule 1   fluticasone (FLONASE) 50 MCG/ACT nasal spray Place 2 sprays into both nostrils daily. 16 g 3   furosemide (LASIX) 20 MG tablet Take 1 tablet (20 mg total) by mouth as needed for weight gain of 2 lbs overnight 30 tablet 3   hydrALAZINE (APRESOLINE) 50 MG tablet Take 1 tablet (50 mg total) by mouth 3 (three) times daily. (Patient taking differently: Take 50 mg by mouth 3 (three) times daily as needed.) 90 tablet 1   HYDROcodone-acetaminophen (NORCO) 10-325 MG tablet Take 1 tablet by mouth daily as needed. 30 tablet 0   Menthol, Topical Analgesic, (BIOFREEZE EX) Apply 1 application  topically 3 (three) times daily as needed (neck pain).     metFORMIN (GLUCOPHAGE-XR) 750 MG 24 hr tablet Take 1 tablet (750 mg total) by mouth daily with breakfast. 90 tablet 2   nitrofurantoin, macrocrystal-monohydrate, (MACROBID) 100 MG capsule Take 1 capsule (100 mg total) by mouth 2 (two) times daily for 5 days. 10 capsule 0   rosuvastatin (CRESTOR) 5 MG tablet Take 1 tablet (5 mg total) by mouth at bedtime. 90 tablet  3   sodium fluoride (PREVIDENT 5000 PLUS) 1.1 % CREA dental cream Brush teeth thoroughly for 2 minutes, do not rinse afterwards 51 g 12   sodium fluoride (PREVIDENT 5000 PLUS) 1.1 % CREA dental cream Brush teeth thoroughly for 2 minutes, do not rise afterwards. 51 g 12   telmisartan (MICARDIS) 80 MG tablet Take 1 tablet (80 mg total) by mouth daily. 90 tablet 1   estradiol (ESTRACE) 0.1 MG/GM vaginal cream Place 0.25 Applicatorfuls vaginally 2 (two) times a week. 42.5 g 2   glucose blood test strip Use up to 4 times a day as directed 100 each 0   Semaglutide, 1 MG/DOSE,  4 MG/3ML SOPN Inject 1 mg as directed once a week. 3 mL 2   traMADol (ULTRAM) 50 MG tablet Take 2 tablets (100 mg total) by mouth every 6 (six) hours as needed (for pain). 180 tablet 5   amoxicillin (AMOXIL) 500 MG capsule Take 2,000 mg by mouth See admin instructions. Take 2,000 mg by mouth one hour prior to dental procedures (Patient not taking: Reported on 07/23/2023)     No facility-administered medications prior to visit.    Review of Systems;  Patient denies headache, fevers, malaise, unintentional weight loss, skin rash, eye pain, sinus congestion and sinus pain, sore throat, dysphagia,  hemoptysis , cough, dyspnea, wheezing, chest pain, palpitations, orthopnea, edema, abdominal pain, nausea, melena, diarrhea, constipation, flank pain, dysuria, hematuria, urinary  Frequency, nocturia, numbness, tingling, seizures,  Focal weakness, Loss of consciousness,  Tremor, insomnia, depression, anxiety, and suicidal ideation.      Objective:  BP 124/78   Pulse 70   Ht 5' 6.5" (1.689 m)   Wt 157 lb 6.4 oz (71.4 kg)   LMP  (LMP Unknown)   SpO2 100%   BMI 25.02 kg/m   BP Readings from Last 3 Encounters:  07/23/23 124/78  05/23/23 120/80  05/12/23 (!) 156/82    Wt Readings from Last 3 Encounters:  07/23/23 157 lb 6.4 oz (71.4 kg)  05/23/23 161 lb (73 kg)  05/12/23 164 lb (74.4 kg)    Physical Exam Vitals reviewed.  Constitutional:      General: She is not in acute distress.    Appearance: Normal appearance. She is normal weight. She is not ill-appearing, toxic-appearing or diaphoretic.  HENT:     Head: Normocephalic.  Eyes:     General: No scleral icterus.       Right eye: No discharge.        Left eye: No discharge.     Conjunctiva/sclera: Conjunctivae normal.  Cardiovascular:     Rate and Rhythm: Normal rate and regular rhythm.     Heart sounds: Normal heart sounds.  Pulmonary:     Effort: Pulmonary effort is normal. No respiratory distress.     Breath sounds: Normal  breath sounds.  Musculoskeletal:        General: Normal range of motion.  Skin:    General: Skin is warm and dry.  Neurological:     General: No focal deficit present.     Mental Status: She is alert and oriented to person, place, and time. Mental status is at baseline.  Psychiatric:        Mood and Affect: Mood normal.        Behavior: Behavior normal.        Thought Content: Thought content normal.        Judgment: Judgment normal.    Lab Results  Component Value Date  HGBA1C 5.6 07/23/2023   HGBA1C 5.8 04/16/2023   HGBA1C 5.7 (A) 12/30/2022    Lab Results  Component Value Date   CREATININE 1.02 07/23/2023   CREATININE 0.93 04/16/2023   CREATININE 0.88 12/30/2022    Lab Results  Component Value Date   WBC 8.9 09/27/2022   HGB 12.7 09/27/2022   HCT 38.4 09/27/2022   PLT 266 09/27/2022   GLUCOSE 83 07/23/2023   CHOL 110 07/23/2023   TRIG 114.0 07/23/2023   HDL 54.00 07/23/2023   LDLDIRECT 38.0 07/23/2023   LDLCALC 33 07/23/2023   ALT 14 07/23/2023   AST 21 07/23/2023   NA 139 07/23/2023   K 4.0 07/23/2023   CL 99 07/23/2023   CREATININE 1.02 07/23/2023   BUN 14 07/23/2023   CO2 31 07/23/2023   TSH 3.03 06/29/2020   INR 1.5 (H) 02/14/2022   HGBA1C 5.6 07/23/2023   MICROALBUR 23.6 (H) 07/23/2023    CT ANGIO CHEST AORTA W/CM & OR WO/CM Result Date: 05/12/2023 CLINICAL DATA:  Follow-up thoracic aortic aneurysm. Status post aortic valve replacement. EXAM: CT ANGIOGRAPHY CHEST WITH CONTRAST TECHNIQUE: Multidetector CT imaging of the chest was performed using the standard protocol during bolus administration of intravenous contrast. Multiplanar CT image reconstructions and MIPs were obtained to evaluate the vascular anatomy. RADIATION DOSE REDUCTION: This exam was performed according to the departmental dose-optimization program which includes automated exposure control, adjustment of the mA and/or kV according to patient size and/or use of iterative reconstruction  technique. CONTRAST:  75 mL ISOVUE-370 IOPAMIDOL (ISOVUE-370) INJECTION 76% COMPARISON:  12/28/2021 FINDINGS: Cardiovascular: Interval aortic valve and ascending thoracic aorta replacement. No recurrent thoracic aortic aneurysm. Mediastinum/Nodes: Thyroid is diffusely heterogeneous follow-up should be performed for prior thyroid ultrasound, most recently from 04/20/2021. No enlarged axillary, supraclavicular, mediastinal, hilar lymph nodes. Lungs/Pleura: No focal consolidative opacity to suggest pneumonia. Multiple 2 mm noncalcified pulmonary nodules located along the subpleural region of the right lower lobe are not significantly changed dating back to at least 04/05/2021 which indicates a benign etiology. This is not require further dedicated imaging follow-up. Upper Abdomen: Asymmetrically atrophied left kidney partially visualized. Visualized upper abdominal organs otherwise unremarkable. Musculoskeletal: No chest wall abnormality. No acute or significant osseous findings. Review of the MIP images confirms the above findings. IMPRESSION: Interval aortic valve and ascending thoracic aorta replacement. No recurrent thoracic aortic aneurysm. Electronically Signed   By: Acquanetta Belling M.D.   On: 05/12/2023 12:01    Assessment & Plan:  .Encounter for screening mammogram for malignant neoplasm of breast -     3D Screening Mammogram, Left and Right; Future  Primary hypertension Assessment & Plan: Home readings have been < 130/80 on currentl regimen .  Has not had to use amlodipine more than once a week.    Orders: -     Microalbumin / creatinine urine ratio  Hyperlipidemia associated with type 2 diabetes mellitus (HCC) -     Lipid panel -     LDL cholesterol, direct  DM type 2 with diabetic mixed hyperlipidemia (HCC) Assessment & Plan: Losing weight on 1 mg dose of ozempic .  Wants to increase walking now that husband's health has improved.    Lab Results  Component Value Date   HGBA1C 5.8  04/16/2023     Orders: -     Microalbumin / creatinine urine ratio -     Comprehensive metabolic panel -     Hemoglobin A1c  White coat syndrome with diagnosis of hypertension Assessment &  Plan: Home readings have been < 130/80 on currentl regimen .  Has not had to use amlodipine more than once a week.     Cervical spine disease Assessment & Plan: Recent aggravation of cervical disks disease due to helping husband recover.  Ran out of tramadol.    Acute cystitis without hematuria -     Urinalysis, Routine w reflex microscopic; Future -     Urine Culture; Future  Thyroid nodule -     US THYROID; Future  Long-term current use of injectable noninsulin antidiabetic medication Assessment & Plan: She has lost weight since Increasing her dose of ozempic to 1 mg weekly.  She would like to remain on current dose and increase her exercise    Caregiver with fatigue Assessment & Plan: She has  been working and caring for husband who is convalescing from recent hospitalization    Other orders -     Estradiol; Place 0.25 Applicatorfuls vaginally 2 (two) times a week.  Dispense: 42.5 g; Refill: 2 -     OneTouch Ultra; Use as instructed  Dispense: 100 each; Refill: 0 -     Semaglutide (1 MG/DOSE); Inject 1 mg as directed once a week.  Dispense: 3 mL; Refill: 2 -     traMADol HCl; Take 2 tablets (100 mg total) by mouth every 6 (six) hours as needed (for pain).  Dispense: 240 tablet; Refill: 5     I spent 34 minutes on the day of this face to face encounter reviewing patient's  most recent visit with cardiology,   prior relevant surgical and non surgical procedures, recent  labs and imaging studies, counseling on weight management,  reviewing the assessment and plan with patient, and post visit ordering and reviewing of  diagnostics and therapeutics with patient  .   Follow-up: Return in about 6 months (around 01/23/2024) for follow up diabetes.   Sherlene Shams, MD

## 2023-07-23 NOTE — Assessment & Plan Note (Addendum)
 Home readings have been < 130/80 on currentl regimen .  Has not had to use amlodipine more than once a week.

## 2023-07-23 NOTE — Patient Instructions (Addendum)
 YOUR MAMMOGRAM IS DUE, PLEASE CALL AND GET THIS SCHEDULED! [facility] - call [phone]   You may submit a urine specimen in the container I gave you if your symptoms persist or return after finishing the Macrobid

## 2023-07-23 NOTE — Assessment & Plan Note (Signed)
 Recent aggravation of cervical disks disease due to helping husband recover.  Ran out of tramadol.

## 2023-07-23 NOTE — Assessment & Plan Note (Signed)
 She has  been working and caring for husband who is convalescing from recent hospitalization

## 2023-07-23 NOTE — Assessment & Plan Note (Addendum)
 She has lost weight since Increasing her dose of ozempic to 1 mg weekly.  She would like to remain on current dose and increase her exercise

## 2023-07-23 NOTE — Assessment & Plan Note (Signed)
 Losing weight on 1 mg dose of ozempic .  Wants to increase walking now that husband's health has improved.    Lab Results  Component Value Date   HGBA1C 5.8 04/16/2023

## 2023-07-24 ENCOUNTER — Ambulatory Visit: Payer: 59

## 2023-07-24 ENCOUNTER — Encounter: Payer: Self-pay | Admitting: Internal Medicine

## 2023-07-24 NOTE — Assessment & Plan Note (Addendum)
 UACR has increased from 78 LAST YEAR TO  200.  Taking ARB,  Recommending SGLT 2 inhibitor given drop in GFR

## 2023-07-24 NOTE — Addendum Note (Signed)
 Addended by: Sherlene Shams on: 07/24/2023 03:06 PM   Modules accepted: Orders

## 2023-07-28 ENCOUNTER — Encounter: Payer: Self-pay | Admitting: Internal Medicine

## 2023-07-28 ENCOUNTER — Other Ambulatory Visit: Payer: Self-pay | Admitting: Internal Medicine

## 2023-07-28 ENCOUNTER — Other Ambulatory Visit: Payer: Self-pay

## 2023-07-28 MED ORDER — EMPAGLIFLOZIN 10 MG PO TABS
10.0000 mg | ORAL_TABLET | Freq: Every day | ORAL | 2 refills | Status: DC
  Filled 2023-07-28: qty 30, 30d supply, fill #0
  Filled 2023-08-26: qty 30, 30d supply, fill #1
  Filled 2023-09-25: qty 30, 30d supply, fill #2

## 2023-08-06 ENCOUNTER — Ambulatory Visit

## 2023-08-06 ENCOUNTER — Other Ambulatory Visit: Payer: Self-pay

## 2023-08-06 MED FILL — Telmisartan Tab 80 MG: ORAL | 90 days supply | Qty: 90 | Fill #1 | Status: CN

## 2023-08-07 ENCOUNTER — Other Ambulatory Visit: Payer: Self-pay

## 2023-08-07 MED FILL — Telmisartan Tab 80 MG: ORAL | 60 days supply | Qty: 60 | Fill #1 | Status: CN

## 2023-08-07 MED FILL — Telmisartan Tab 80 MG: ORAL | 90 days supply | Qty: 90 | Fill #1 | Status: CN

## 2023-08-08 ENCOUNTER — Other Ambulatory Visit: Payer: Self-pay

## 2023-08-08 MED FILL — Telmisartan Tab 80 MG: ORAL | 60 days supply | Qty: 60 | Fill #2 | Status: CN

## 2023-08-08 MED FILL — Telmisartan Tab 80 MG: ORAL | 30 days supply | Qty: 30 | Fill #2 | Status: CN

## 2023-08-08 MED FILL — Telmisartan Tab 80 MG: ORAL | 90 days supply | Qty: 90 | Fill #1 | Status: AC

## 2023-08-13 ENCOUNTER — Ambulatory Visit

## 2023-08-14 ENCOUNTER — Other Ambulatory Visit: Payer: Self-pay

## 2023-08-20 ENCOUNTER — Ambulatory Visit: Attending: Cardiovascular Disease

## 2023-08-20 DIAGNOSIS — I701 Atherosclerosis of renal artery: Secondary | ICD-10-CM

## 2023-08-21 ENCOUNTER — Ambulatory Visit
Admission: RE | Admit: 2023-08-21 | Discharge: 2023-08-21 | Disposition: A | Discharge status: Home / Self Care | Source: Ambulatory Visit | Attending: Internal Medicine | Admitting: Internal Medicine

## 2023-08-21 DIAGNOSIS — E041 Nontoxic single thyroid nodule: Secondary | ICD-10-CM | POA: Diagnosis not present

## 2023-08-21 DIAGNOSIS — E042 Nontoxic multinodular goiter: Secondary | ICD-10-CM | POA: Diagnosis not present

## 2023-08-24 ENCOUNTER — Encounter: Payer: Self-pay | Admitting: Internal Medicine

## 2023-08-25 ENCOUNTER — Other Ambulatory Visit: Payer: Self-pay | Admitting: *Deleted

## 2023-08-25 DIAGNOSIS — I701 Atherosclerosis of renal artery: Secondary | ICD-10-CM

## 2023-08-27 ENCOUNTER — Other Ambulatory Visit: Payer: Self-pay

## 2023-09-01 ENCOUNTER — Other Ambulatory Visit: Payer: Self-pay

## 2023-09-17 ENCOUNTER — Other Ambulatory Visit: Payer: Self-pay

## 2023-09-17 ENCOUNTER — Encounter (HOSPITAL_COMMUNITY): Payer: Self-pay

## 2023-09-22 ENCOUNTER — Other Ambulatory Visit: Payer: Self-pay

## 2023-09-24 ENCOUNTER — Ambulatory Visit
Admission: RE | Admit: 2023-09-24 | Discharge: 2023-09-24 | Disposition: A | Discharge status: Home / Self Care | Source: Ambulatory Visit | Attending: Internal Medicine | Admitting: Internal Medicine

## 2023-09-24 DIAGNOSIS — Z1231 Encounter for screening mammogram for malignant neoplasm of breast: Secondary | ICD-10-CM | POA: Insufficient documentation

## 2023-09-26 ENCOUNTER — Other Ambulatory Visit: Payer: Self-pay

## 2023-10-14 ENCOUNTER — Other Ambulatory Visit: Payer: Self-pay

## 2023-10-24 ENCOUNTER — Other Ambulatory Visit: Payer: Self-pay | Admitting: Internal Medicine

## 2023-10-24 ENCOUNTER — Other Ambulatory Visit: Payer: Self-pay

## 2023-10-24 MED FILL — Alprazolam Tab 0.25 MG: ORAL | 30 days supply | Qty: 30 | Fill #0 | Status: AC

## 2023-11-03 ENCOUNTER — Other Ambulatory Visit: Payer: Self-pay

## 2023-11-03 ENCOUNTER — Other Ambulatory Visit: Payer: Self-pay | Admitting: Internal Medicine

## 2023-11-03 DIAGNOSIS — I10 Essential (primary) hypertension: Secondary | ICD-10-CM

## 2023-11-04 ENCOUNTER — Other Ambulatory Visit: Payer: Self-pay

## 2023-11-04 MED FILL — Telmisartan Tab 80 MG: ORAL | 90 days supply | Qty: 90 | Fill #0 | Status: AC

## 2023-11-04 MED FILL — Empagliflozin Tab 10 MG: ORAL | 90 days supply | Qty: 90 | Fill #0 | Status: AC

## 2023-11-09 ENCOUNTER — Other Ambulatory Visit: Payer: Self-pay | Admitting: Internal Medicine

## 2023-11-10 ENCOUNTER — Other Ambulatory Visit: Payer: Self-pay | Admitting: Internal Medicine

## 2023-11-10 ENCOUNTER — Other Ambulatory Visit: Payer: Self-pay

## 2023-11-10 DIAGNOSIS — J069 Acute upper respiratory infection, unspecified: Secondary | ICD-10-CM

## 2023-11-11 ENCOUNTER — Other Ambulatory Visit: Payer: Self-pay

## 2023-11-11 MED FILL — Semaglutide Soln Pen-inj 1 MG/DOSE (4 MG/3ML): SUBCUTANEOUS | 28 days supply | Qty: 3 | Fill #0 | Status: AC

## 2023-11-11 NOTE — Telephone Encounter (Signed)
 LMTCB. Need to find out if pt is still using the flonase  and if she requested the refill.

## 2023-11-12 ENCOUNTER — Other Ambulatory Visit: Payer: Self-pay

## 2023-11-12 ENCOUNTER — Other Ambulatory Visit

## 2023-11-13 ENCOUNTER — Other Ambulatory Visit: Payer: Self-pay

## 2023-11-13 MED FILL — Fluticasone Propionate Nasal Susp 50 MCG/ACT: NASAL | 30 days supply | Qty: 16 | Fill #0 | Status: CN

## 2023-11-13 NOTE — Telephone Encounter (Signed)
 Copied from CRM [id number]. Topic: Clinical - Medication Question >> Nov 13, 2023  4:28 PM Drema MATSU wrote: Reason for CRM: Patient is returning a call from [place]. Patient stated that she is still using Flonase  and is requesting a refill for it.

## 2023-11-13 NOTE — Telephone Encounter (Signed)
 Left message for patient to give our office a call back in regards to Flonase  prescription refill request.   OK for E2C2 to give note if patient calls back. If clarified, please notify the office.

## 2023-11-13 NOTE — Telephone Encounter (Signed)
 Noted. Prescription sent to patient's local pharmacy.

## 2023-11-17 ENCOUNTER — Other Ambulatory Visit: Payer: Self-pay

## 2023-11-18 DIAGNOSIS — L821 Other seborrheic keratosis: Secondary | ICD-10-CM | POA: Diagnosis not present

## 2023-11-18 DIAGNOSIS — L814 Other melanin hyperpigmentation: Secondary | ICD-10-CM | POA: Diagnosis not present

## 2023-11-18 DIAGNOSIS — D225 Melanocytic nevi of trunk: Secondary | ICD-10-CM | POA: Diagnosis not present

## 2023-11-18 DIAGNOSIS — L910 Hypertrophic scar: Secondary | ICD-10-CM | POA: Diagnosis not present

## 2023-11-18 DIAGNOSIS — L579 Skin changes due to chronic exposure to nonionizing radiation, unspecified: Secondary | ICD-10-CM | POA: Diagnosis not present

## 2023-11-18 DIAGNOSIS — L570 Actinic keratosis: Secondary | ICD-10-CM | POA: Diagnosis not present

## 2023-11-18 DIAGNOSIS — D235 Other benign neoplasm of skin of trunk: Secondary | ICD-10-CM | POA: Diagnosis not present

## 2023-11-25 ENCOUNTER — Other Ambulatory Visit: Payer: Self-pay

## 2023-11-26 ENCOUNTER — Other Ambulatory Visit: Payer: Self-pay

## 2023-11-26 ENCOUNTER — Telehealth: Payer: Self-pay

## 2023-11-26 ENCOUNTER — Other Ambulatory Visit

## 2023-11-26 DIAGNOSIS — E041 Nontoxic single thyroid nodule: Secondary | ICD-10-CM

## 2023-11-26 DIAGNOSIS — E876 Hypokalemia: Secondary | ICD-10-CM

## 2023-11-26 DIAGNOSIS — E1169 Type 2 diabetes mellitus with other specified complication: Secondary | ICD-10-CM

## 2023-11-26 MED FILL — Alprazolam Tab 0.25 MG: ORAL | 30 days supply | Qty: 30 | Fill #1 | Status: AC

## 2023-11-26 NOTE — Addendum Note (Signed)
 Addended by: MARYLYNN VERNEITA CROME on: 11/26/2023 03:04 PM   Modules accepted: Orders

## 2023-11-26 NOTE — Telephone Encounter (Signed)
 Patient notified and verbalized understanding.

## 2023-11-26 NOTE — Telephone Encounter (Signed)
 Copied from CRM [id number]. Topic: Clinical - Request for Lab/Test Order >> Nov 26, 2023 12:54 PM Suzen RAMAN wrote: Reason for CRM: Patient would like to know if orders can be faxed to [facility] (employer). Per patient this would be easier for her to get her labs completed. If unable to fax orders patient also inquired about having lab orders emailed to her.  CB#[phone] (M)

## 2023-12-02 ENCOUNTER — Other Ambulatory Visit: Payer: Self-pay | Admitting: Internal Medicine

## 2023-12-02 ENCOUNTER — Other Ambulatory Visit: Payer: Self-pay

## 2023-12-02 MED FILL — Carvedilol Tab 25 MG: ORAL | 90 days supply | Qty: 180 | Fill #0 | Status: AC

## 2023-12-03 ENCOUNTER — Other Ambulatory Visit
Admission: RE | Admit: 2023-12-03 | Discharge: 2023-12-03 | Disposition: A | Discharge status: Home / Self Care | Attending: Internal Medicine | Admitting: Internal Medicine

## 2023-12-03 ENCOUNTER — Other Ambulatory Visit: Payer: Self-pay

## 2023-12-03 DIAGNOSIS — E782 Mixed hyperlipidemia: Secondary | ICD-10-CM | POA: Insufficient documentation

## 2023-12-03 DIAGNOSIS — E041 Nontoxic single thyroid nodule: Secondary | ICD-10-CM | POA: Insufficient documentation

## 2023-12-03 DIAGNOSIS — E1169 Type 2 diabetes mellitus with other specified complication: Secondary | ICD-10-CM | POA: Insufficient documentation

## 2023-12-03 DIAGNOSIS — E785 Hyperlipidemia, unspecified: Secondary | ICD-10-CM | POA: Diagnosis not present

## 2023-12-03 LAB — LIPID PANEL
Cholesterol: 95 mg/dL (ref 0–200)
HDL: 52 mg/dL (ref >40)
LDL Cholesterol: 25 mg/dL (ref 0–99)
Total CHOL/HDL Ratio: 1.8 ratio
Triglycerides: 89 mg/dL (ref <150)
VLDL: 18 mg/dL (ref 0–40)

## 2023-12-03 LAB — HEMOGLOBIN A1C
Hgb A1c MFr Bld: 5.2 % (ref 4.8–5.6)
Mean Plasma Glucose: 102.54 mg/dL

## 2023-12-03 LAB — HEPATIC FUNCTION PANEL
ALT: 20 U/L (ref 0–44)
AST: 28 U/L (ref 15–41)
Albumin: 3.7 g/dL (ref 3.5–5.0)
Alkaline Phosphatase: 90 U/L (ref 38–126)
Bilirubin, Direct: <0.1 mg/dL (ref 0.0–0.2)
Total Bilirubin: 0.4 mg/dL (ref 0.0–1.2)
Total Protein: 6.5 g/dL (ref 6.5–8.1)

## 2023-12-03 LAB — TSH: TSH: 3.182 u[IU]/mL (ref 0.350–4.500)

## 2023-12-03 MED FILL — Fluticasone Propionate Nasal Susp 50 MCG/ACT: NASAL | 30 days supply | Qty: 16 | Fill #0 | Status: AC

## 2023-12-03 NOTE — Addendum Note (Signed)
 Addended by: ALEX JON BROCKS on: 12/03/2023 06:37 AM   Modules accepted: Orders

## 2023-12-05 ENCOUNTER — Ambulatory Visit: Payer: Self-pay | Admitting: Internal Medicine

## 2023-12-10 ENCOUNTER — Other Ambulatory Visit: Payer: Self-pay

## 2023-12-10 MED FILL — Semaglutide Soln Pen-inj 1 MG/DOSE (4 MG/3ML): SUBCUTANEOUS | 28 days supply | Qty: 3 | Fill #1 | Status: AC

## 2023-12-11 ENCOUNTER — Other Ambulatory Visit: Payer: Self-pay

## 2023-12-18 ENCOUNTER — Other Ambulatory Visit: Payer: Self-pay | Admitting: Internal Medicine

## 2023-12-18 ENCOUNTER — Other Ambulatory Visit: Payer: Self-pay

## 2023-12-18 DIAGNOSIS — F411 Generalized anxiety disorder: Secondary | ICD-10-CM

## 2023-12-18 MED ORDER — DULOXETINE HCL 20 MG PO CPEP
20.0000 mg | ORAL_CAPSULE | Freq: Every day | ORAL | 1 refills | Status: DC
  Filled 2023-12-18: qty 90, 90d supply, fill #0

## 2023-12-29 ENCOUNTER — Other Ambulatory Visit: Payer: Self-pay

## 2023-12-29 MED FILL — Alprazolam Tab 0.25 MG: ORAL | 30 days supply | Qty: 30 | Fill #2 | Status: AC

## 2023-12-29 MED FILL — Fluticasone Propionate Nasal Susp 50 MCG/ACT: NASAL | 30 days supply | Qty: 16 | Fill #1 | Status: AC

## 2024-01-07 MED FILL — Semaglutide Soln Pen-inj 1 MG/DOSE (4 MG/3ML): SUBCUTANEOUS | 28 days supply | Qty: 3 | Fill #2 | Status: AC

## 2024-01-14 ENCOUNTER — Ambulatory Visit: Admitting: Internal Medicine

## 2024-01-14 ENCOUNTER — Other Ambulatory Visit: Payer: Self-pay

## 2024-01-14 ENCOUNTER — Encounter: Payer: Self-pay | Admitting: Internal Medicine

## 2024-01-14 VITALS — BP 142/87 | HR 78 | Ht 66.5 in | Wt 159.8 lb

## 2024-01-14 DIAGNOSIS — I10 Essential (primary) hypertension: Secondary | ICD-10-CM

## 2024-01-14 DIAGNOSIS — E785 Hyperlipidemia, unspecified: Secondary | ICD-10-CM | POA: Diagnosis not present

## 2024-01-14 DIAGNOSIS — E1121 Type 2 diabetes mellitus with diabetic nephropathy: Secondary | ICD-10-CM | POA: Diagnosis not present

## 2024-01-14 DIAGNOSIS — F411 Generalized anxiety disorder: Secondary | ICD-10-CM

## 2024-01-14 DIAGNOSIS — E1169 Type 2 diabetes mellitus with other specified complication: Secondary | ICD-10-CM

## 2024-01-14 DIAGNOSIS — Z23 Encounter for immunization: Secondary | ICD-10-CM

## 2024-01-14 DIAGNOSIS — F4322 Adjustment disorder with anxiety: Secondary | ICD-10-CM

## 2024-01-14 DIAGNOSIS — I701 Atherosclerosis of renal artery: Secondary | ICD-10-CM | POA: Diagnosis not present

## 2024-01-14 DIAGNOSIS — E782 Mixed hyperlipidemia: Secondary | ICD-10-CM | POA: Diagnosis not present

## 2024-01-14 DIAGNOSIS — Z7984 Long term (current) use of oral hypoglycemic drugs: Secondary | ICD-10-CM | POA: Diagnosis not present

## 2024-01-14 MED ORDER — DULOXETINE HCL 20 MG PO CPEP
40.0000 mg | ORAL_CAPSULE | Freq: Every day | ORAL | 2 refills | Status: DC
  Filled 2024-01-14 – 2024-02-12 (×2): qty 60, 30d supply, fill #0
  Filled 2024-03-13: qty 60, 30d supply, fill #1
  Filled 2024-04-11: qty 60, 30d supply, fill #2

## 2024-01-14 MED ORDER — HYDROCODONE-ACETAMINOPHEN 10-325 MG PO TABS
1.0000 | ORAL_TABLET | Freq: Every day | ORAL | 0 refills | Status: AC | PRN
  Filled 2024-01-14: qty 30, 30d supply, fill #0

## 2024-01-14 MED ORDER — METFORMIN HCL ER 750 MG PO TB24
750.0000 mg | ORAL_TABLET | Freq: Every day | ORAL | 2 refills | Status: AC
  Filled 2024-01-14 – 2024-04-28 (×2): qty 90, 90d supply, fill #0

## 2024-01-14 MED ORDER — CARVEDILOL 25 MG PO TABS
25.0000 mg | ORAL_TABLET | Freq: Two times a day (BID) | ORAL | 1 refills | Status: AC
  Filled 2024-01-14 – 2024-03-03 (×2): qty 180, 90d supply, fill #0
  Filled 2024-05-31: qty 180, 90d supply, fill #1
  Filled 2024-06-01: qty 180, 90d supply, fill #0

## 2024-01-14 MED ORDER — OZEMPIC (1 MG/DOSE) 4 MG/3ML ~~LOC~~ SOPN
1.0000 mg | PEN_INJECTOR | SUBCUTANEOUS | 2 refills | Status: DC
  Filled 2024-01-14 – 2024-02-04 (×2): qty 3, 28d supply, fill #0
  Filled 2024-02-28: qty 3, 28d supply, fill #1
  Filled 2024-04-01 (×2): qty 3, 28d supply, fill #2

## 2024-01-14 NOTE — Progress Notes (Signed)
 Subjective:  Patient ID: Courtney Lopez, female    DOB: [birth year]  Age: 63 y.o. MRN: [id number]  CC: The primary encounter diagnosis was RAS (renal artery stenosis) (HCC). Diagnoses of Hyperlipidemia associated with type 2 diabetes mellitus (HCC), Generalized anxiety disorder, Need for diphtheria-tetanus-pertussis (Tdap) vaccine, Adjustment disorder with anxious mood, Diabetic nephropathy associated with type 2 diabetes mellitus (HCC), DM type 2 with diabetic mixed hyperlipidemia (HCC), and White coat syndrome with diagnosis of hypertension were also pertinent to this visit.   HPI Courtney Lopez presents for  Chief Complaint  Patient presents with   Medical Management of Chronic Issues     Hypertension:  patent has been having elevated readings for the past week and notes that she has bee using her prn doses of  amlodipine  and apresoline  more often due to elevated readings.  The hydralazine  and amlodipine  are causing fluid retention , and she is worried that using furosemide  is affecting her renal function.  She wonders if her anxiety about her health is driving her readings up.  She denies chest pain  dyspnea at rest and with exertion and feels generally well.       Outpatient Medications Prior to Visit  Medication Sig Dispense Refill   acetaminophen  (TYLENOL ) 325 MG tablet Take 2 tablets (650 mg total) by mouth every 6 (six) hours as needed for mild pain.     ALPRAZolam  (XANAX ) 0.25 MG tablet Take 1 tablet (0.25 mg total) by mouth at bedtime as needed. 30 tablet 2   amLODipine  (NORVASC ) 5 MG tablet Take 1 tablet (5 mg total) by mouth daily. 90 tablet 1   aspirin  EC 81 MG tablet Take 1 tablet (81 mg total) by mouth daily. Swallow whole. 30 tablet 12   blood glucose meter kit and supplies KIT Dispense based on patient and insurance preference. Use up to four times daily as directed. 1 each 0   empagliflozin  (JARDIANCE ) 10 MG TABS tablet Take 1 tablet (10 mg total) by mouth daily  before breakfast. 90 tablet 1   estradiol  (ESTRACE ) 0.1 MG/GM vaginal cream Place 0.25 Applicatorfuls vaginally 2 (two) times a week. 42.5 g 2   fluticasone  (FLONASE ) 50 MCG/ACT nasal spray Place 2 sprays into both nostrils daily. 16 g 1   furosemide  (LASIX ) 20 MG tablet Take 1 tablet (20 mg total) by mouth as needed for weight gain of 2 lbs overnight 30 tablet 3   glucose blood (ONETOUCH ULTRA) test strip Use as instructed 100 each 0   hydrALAZINE  (APRESOLINE ) 50 MG tablet Take 1 tablet (50 mg total) by mouth 3 (three) times daily. 90 tablet 1   Menthol, Topical Analgesic, (BIOFREEZE EX) Apply 1 application  topically 3 (three) times daily as needed (neck pain).     rosuvastatin  (CRESTOR ) 5 MG tablet Take 1 tablet (5 mg total) by mouth at bedtime. 90 tablet 3   sodium fluoride  (PREVIDENT 5000 PLUS) 1.1 % CREA dental cream Brush teeth thoroughly for 2 minutes, do not rinse afterwards 51 g 12   telmisartan  (MICARDIS ) 80 MG tablet Take 1 tablet (80 mg total) by mouth daily. 90 tablet 1   traMADol  (ULTRAM ) 50 MG tablet Take 2 tablets (100 mg total) by mouth every 6 (six) hours as needed (for pain). 240 tablet 5   carvedilol  (COREG ) 25 MG tablet Take 1 tablet (25 mg total) by mouth 2 (two) times daily with a meal. 180 tablet 0   DULoxetine  (CYMBALTA ) 20 MG capsule Take 1 capsule (20  mg total) by mouth daily. 90 capsule 1   HYDROcodone -acetaminophen  (NORCO) 10-325 MG tablet Take 1 tablet by mouth daily as needed. 30 tablet 0   metFORMIN  (GLUCOPHAGE -XR) 750 MG 24 hr tablet Take 1 tablet (750 mg total) by mouth daily with breakfast. 90 tablet 2   Semaglutide , 1 MG/DOSE, (OZEMPIC , 1 MG/DOSE,) 4 MG/3ML SOPN Inject 1 mg into the skin once a week. 3 mL 2   sodium fluoride  (PREVIDENT 5000 PLUS) 1.1 % CREA dental cream Brush teeth thoroughly for 2 minutes, do not rise afterwards. 51 g 12   amoxicillin  (AMOXIL ) 500 MG capsule Take 2,000 mg by mouth See admin instructions. Take 2,000 mg by mouth one hour prior to  dental procedures (Patient not taking: Reported on 01/14/2024)     spironolactone  (ALDACTONE ) 25 MG tablet Take 1 tablet (25 mg total) by mouth daily. (Patient not taking: Reported on 05/23/2023) 30 tablet 5   No facility-administered medications prior to visit.    Review of Systems;  Patient denies headache, fevers, malaise, unintentional weight loss, skin rash, eye pain, sinus congestion and sinus pain, sore throat, dysphagia,  hemoptysis , cough, dyspnea, wheezing, chest pain, palpitations, orthopnea, edema, abdominal pain, nausea, melena, diarrhea, constipation, flank pain, dysuria, hematuria, urinary  Frequency, nocturia, numbness, tingling, seizures,  Focal weakness, Loss of consciousness,  Tremor, insomnia, depression, anxiety, and suicidal ideation.      Objective:  BP (!) 142/87   Pulse 78   Ht 5' 6.5 (1.689 m)   Wt 159 lb 12.8 oz (72.5 kg)   LMP  (LMP Unknown)   SpO2 97%   BMI 25.41 kg/m   BP Readings from Last 3 Encounters:  01/14/24 (!) 142/87  07/23/23 124/78  05/23/23 120/80    Wt Readings from Last 3 Encounters:  01/14/24 159 lb 12.8 oz (72.5 kg)  07/23/23 157 lb 6.4 oz (71.4 kg)  05/23/23 161 lb (73 kg)    Physical Exam Vitals reviewed.  Constitutional:      General: She is not in acute distress.    Appearance: Normal appearance. She is normal weight. She is not ill-appearing, toxic-appearing or diaphoretic.  HENT:     Head: Normocephalic.  Eyes:     General: No scleral icterus.       Right eye: No discharge.        Left eye: No discharge.     Conjunctiva/sclera: Conjunctivae normal.  Cardiovascular:     Rate and Rhythm: Normal rate and regular rhythm.     Pulses: Normal pulses.     Heart sounds: Normal heart sounds.  Pulmonary:     Effort: Pulmonary effort is normal. No respiratory distress.     Breath sounds: Normal breath sounds.  Musculoskeletal:        General: Normal range of motion.  Skin:    General: Skin is warm and dry.  Neurological:      General: No focal deficit present.     Mental Status: She is alert and oriented to person, place, and time. Mental status is at baseline.  Psychiatric:        Mood and Affect: Mood normal.        Behavior: Behavior normal.        Thought Content: Thought content normal.        Judgment: Judgment normal.     Lab Results  Component Value Date   HGBA1C 5.2 12/03/2023   HGBA1C 5.6 07/23/2023   HGBA1C 5.8 04/16/2023    Lab Results  Component Value Date   CREATININE 1.00 01/14/2024   CREATININE 1.02 07/23/2023   CREATININE 0.93 04/16/2023    Lab Results  Component Value Date   WBC 8.9 09/27/2022   HGB 12.7 09/27/2022   HCT 38.4 09/27/2022   PLT 266 09/27/2022   GLUCOSE 92 01/14/2024   CHOL 95 12/03/2023   TRIG 89 12/03/2023   HDL 52 12/03/2023   LDLDIRECT 38.0 07/23/2023   LDLCALC 25 12/03/2023   ALT 20 12/03/2023   AST 28 12/03/2023   NA 139 01/14/2024   K 4.0 01/14/2024   CL 97 01/14/2024   CREATININE 1.00 01/14/2024   BUN 15 01/14/2024   CO2 32 01/14/2024   TSH 3.182 12/03/2023   INR 1.5 (H) 02/14/2022   HGBA1C 5.2 12/03/2023   MICROALBUR 5.6 (H) 01/14/2024    No results found.  Assessment & Plan:  .RAS (renal artery stenosis) (HCC) Assessment & Plan: PATENT LEFT RENAL ARTERY STENT BY APRIL 2025 DOPPLERS.  REPEAT ONE YEAR   Orders: -     Microalbumin / creatinine urine ratio -     Basic metabolic panel with GFR  Hyperlipidemia associated with type 2 diabetes mellitus (HCC) Assessment & Plan: LDL is at goal on rosuvastatin ,  and LFTs are normal  Lab Results  Component Value Date   CHOL 95 12/03/2023   HDL 52 12/03/2023   LDLCALC 25 12/03/2023   LDLDIRECT 38.0 07/23/2023   TRIG 89 12/03/2023   CHOLHDL 1.8 12/03/2023   Lab Results  Component Value Date   ALT 20 12/03/2023   AST 28 12/03/2023   ALKPHOS 90 12/03/2023   BILITOT 0.4 12/03/2023      Generalized anxiety disorder -     DULoxetine  HCl; Take 2 capsules (40 mg total) by mouth  daily.  Dispense: 60 capsule; Refill: 2  Need for diphtheria-tetanus-pertussis (Tdap) vaccine -     Tdap vaccine greater than or equal to 7yo IM  Adjustment disorder with anxious mood Assessment & Plan: Agree that her anxiety may be elevating her readings .  Increase cymbalta  dose to 40 mg  . Advised that she can continue using alprazolam  once daily (she has reduced her use )    Diabetic nephropathy associated with type 2 diabetes mellitus (HCC) Assessment & Plan: She is tolerating Jardiance  10 mg daily  started in June for increase in UACR from 78 LAST YEAR TO  200.   UaCr has improved from 197 to 63 .  Continue ARB and SGLT 2 inhibitor    DM type 2 with diabetic mixed hyperlipidemia (HCC) Assessment & Plan: She has lost 11 lbs  using ozempic   and BMI is now. < 26.  Continue current dose.  Metformin  and Jardiance  .  Lab Results  Component Value Date   HGBA1C 5.2 12/03/2023      White coat syndrome with diagnosis of hypertension Assessment & Plan: Home readings have been elevated on  current regimen  of amlodipine   5 mg,  carvedilol  25 mg bid , hydralazine  50 mg tid, and 80 mg  telmisartan .  she has been using hydralazine  and extra amlodipine  prn.  Advised to reduce furosemide  to every other day when needed  to avoid prerenal azotemia.   Lab Results  Component Value Date   CREATININE 1.00 01/14/2024   Lab Results  Component Value Date   NA 139 01/14/2024   K 4.0 01/14/2024   CL 97 01/14/2024   CO2 32 01/14/2024   Lab Results  Component Value Date  MICROALBUR 5.6 (H) 01/14/2024   MICROALBUR 23.6 (H) 07/23/2023        Other orders -     Carvedilol ; Take 1 tablet (25 mg total) by mouth 2 (two) times daily with a meal.  Dispense: 180 tablet; Refill: 1 -     metFORMIN  HCl ER; Take 1 tablet (750 mg total) by mouth daily with breakfast.  Dispense: 90 tablet; Refill: 2 -     Ozempic  (1 MG/DOSE); Inject 1 mg into the skin once a week.  Dispense: 3 mL; Refill: 2 -      HYDROcodone -Acetaminophen ; Take 1 tablet by mouth daily as needed.  Dispense: 30 tablet; Refill: 0     I spent 34 minutes on the day of this face to face encounter reviewing patient's  most recent visit with cardiology,  nephrology,  and neurology,  prior relevant surgical and non surgical procedures, recent  labs and imaging studies, counseling on weight management,  reviewing the assessment and plan with patient, and post visit ordering and reviewing of  diagnostics and therapeutics with patient  .   Follow-up: Return in about 3 months (around 04/14/2024) for follow up diabetes.   Courtney LITTIE Kettering, MD

## 2024-01-14 NOTE — Patient Instructions (Signed)
 Increase the dose of  cymbalta  to 40 mg daily as a TRIAL for improved anxiety management   Use furosemide  every other day if needed for fluid retention .

## 2024-01-14 NOTE — Assessment & Plan Note (Signed)
 LDL is at goal on rosuvastatin ,  and LFTs are normal  Lab Results  Component Value Date   CHOL 95 12/03/2023   HDL 52 12/03/2023   LDLCALC 25 12/03/2023   LDLDIRECT 38.0 07/23/2023   TRIG 89 12/03/2023   CHOLHDL 1.8 12/03/2023   Lab Results  Component Value Date   ALT 20 12/03/2023   AST 28 12/03/2023   ALKPHOS 90 12/03/2023   BILITOT 0.4 12/03/2023

## 2024-01-14 NOTE — Assessment & Plan Note (Signed)
 PATENT LEFT RENAL ARTERY STENT BY APRIL 2025 DOPPLERS.  REPEAT ONE YEAR

## 2024-01-15 LAB — MICROALBUMIN / CREATININE URINE RATIO
Creatinine,U: 88.9 mg/dL
Microalb Creat Ratio: 63.5 mg/g — ABNORMAL HIGH (ref 0.0–30.0)
Microalb, Ur: 5.6 mg/dL — ABNORMAL HIGH (ref 0.0–1.9)

## 2024-01-15 LAB — BASIC METABOLIC PANEL WITH GFR
BUN: 15 mg/dL (ref 6–23)
CO2: 32 meq/L (ref 19–32)
Calcium: 9.3 mg/dL (ref 8.4–10.5)
Chloride: 97 meq/L (ref 96–112)
Creatinine, Ser: 1 mg/dL (ref 0.40–1.20)
GFR: 59.33 mL/min — ABNORMAL LOW (ref >60.00)
Glucose, Bld: 92 mg/dL (ref 70–99)
Potassium: 4 meq/L (ref 3.5–5.1)
Sodium: 139 meq/L (ref 135–145)

## 2024-01-17 ENCOUNTER — Ambulatory Visit: Payer: Self-pay | Admitting: Internal Medicine

## 2024-01-17 NOTE — Assessment & Plan Note (Addendum)
 Home readings have been elevated on  current regimen  of amlodipine   5 mg,  carvedilol  25 mg bid , hydralazine  50 mg tid, and 80 mg  telmisartan .  she has been using hydralazine  and extra amlodipine  prn.  Advised to reduce furosemide  to every other day when needed  to avoid prerenal azotemia.   Lab Results  Component Value Date   CREATININE 1.00 01/14/2024   Lab Results  Component Value Date   NA 139 01/14/2024   K 4.0 01/14/2024   CL 97 01/14/2024   CO2 32 01/14/2024   Lab Results  Component Value Date   MICROALBUR 5.6 (H) 01/14/2024   MICROALBUR 23.6 (H) 07/23/2023

## 2024-01-17 NOTE — Assessment & Plan Note (Signed)
 She has lost 11 lbs  using ozempic   and BMI is now. < 26.  Continue current dose.  Metformin  and Jardiance  .  Lab Results  Component Value Date   HGBA1C 5.2 12/03/2023

## 2024-01-17 NOTE — Assessment & Plan Note (Addendum)
 She is tolerating Jardiance  10 mg daily  started in June for increase in UACR from 78 LAST YEAR TO  200.   UaCr has improved from 197 to 63 .  Continue ARB and SGLT 2 inhibitor

## 2024-01-17 NOTE — Assessment & Plan Note (Signed)
 Agree that her anxiety may be elevating her readings .  Increase cymbalta  dose to 40 mg  . Advised that she can continue using alprazolam  once daily (she has reduced her use )

## 2024-01-27 ENCOUNTER — Other Ambulatory Visit: Payer: Self-pay | Admitting: Internal Medicine

## 2024-01-27 MED FILL — Telmisartan Tab 80 MG: ORAL | 90 days supply | Qty: 90 | Fill #1 | Status: CN

## 2024-01-28 ENCOUNTER — Other Ambulatory Visit: Payer: Self-pay

## 2024-01-28 MED ORDER — TRAMADOL HCL 50 MG PO TABS
100.0000 mg | ORAL_TABLET | Freq: Four times a day (QID) | ORAL | 5 refills | Status: AC | PRN
  Filled 2024-01-28: qty 240, 30d supply, fill #0
  Filled 2024-03-03: qty 240, 30d supply, fill #1
  Filled 2024-04-11: qty 240, 30d supply, fill #2
  Filled 2024-05-23: qty 240, 30d supply, fill #3

## 2024-01-29 ENCOUNTER — Other Ambulatory Visit: Payer: Self-pay | Admitting: Internal Medicine

## 2024-01-29 ENCOUNTER — Other Ambulatory Visit: Payer: Self-pay

## 2024-01-29 DIAGNOSIS — J069 Acute upper respiratory infection, unspecified: Secondary | ICD-10-CM

## 2024-01-29 MED ORDER — FLUTICASONE PROPIONATE 50 MCG/ACT NA SUSP
2.0000 | Freq: Every day | NASAL | 0 refills | Status: DC
  Filled 2024-01-29: qty 16, 30d supply, fill #0

## 2024-01-29 MED FILL — Telmisartan Tab 80 MG: ORAL | 90 days supply | Qty: 90 | Fill #1 | Status: AC

## 2024-01-29 MED FILL — Alprazolam Tab 0.25 MG: ORAL | 30 days supply | Qty: 30 | Fill #0 | Status: AC

## 2024-02-04 ENCOUNTER — Other Ambulatory Visit: Payer: Self-pay

## 2024-02-05 ENCOUNTER — Other Ambulatory Visit: Payer: Self-pay

## 2024-02-05 MED ORDER — AMOXICILLIN 500 MG PO CAPS
2000.0000 mg | ORAL_CAPSULE | Freq: Once | ORAL | 6 refills | Status: AC
  Filled 2024-02-05 – 2024-02-16 (×2): qty 12, 3d supply, fill #0

## 2024-02-12 ENCOUNTER — Other Ambulatory Visit: Payer: Self-pay

## 2024-02-12 MED FILL — Empagliflozin Tab 10 MG: ORAL | 90 days supply | Qty: 90 | Fill #1 | Status: AC

## 2024-02-13 ENCOUNTER — Other Ambulatory Visit: Payer: Self-pay

## 2024-02-16 ENCOUNTER — Other Ambulatory Visit: Payer: Self-pay

## 2024-02-28 MED FILL — Alprazolam Tab 0.25 MG: ORAL | 30 days supply | Qty: 30 | Fill #1 | Status: AC

## 2024-03-01 ENCOUNTER — Other Ambulatory Visit: Payer: Self-pay

## 2024-03-04 ENCOUNTER — Other Ambulatory Visit: Payer: Self-pay | Admitting: Internal Medicine

## 2024-03-04 ENCOUNTER — Other Ambulatory Visit: Payer: Self-pay

## 2024-03-04 DIAGNOSIS — J069 Acute upper respiratory infection, unspecified: Secondary | ICD-10-CM

## 2024-03-05 ENCOUNTER — Other Ambulatory Visit: Payer: Self-pay

## 2024-03-05 MED ORDER — FLUTICASONE PROPIONATE 50 MCG/ACT NA SUSP
2.0000 | Freq: Every day | NASAL | 0 refills | Status: DC
  Filled 2024-03-05 – 2024-04-01 (×2): qty 16, 30d supply, fill #0

## 2024-03-15 ENCOUNTER — Other Ambulatory Visit: Payer: Self-pay

## 2024-03-15 ENCOUNTER — Other Ambulatory Visit: Payer: Self-pay | Admitting: Internal Medicine

## 2024-03-15 MED ORDER — ESTRADIOL 0.01 % VA CREA
1.0000 | TOPICAL_CREAM | VAGINAL | 12 refills | Status: AC
  Filled 2024-03-15: qty 42.5, 30d supply, fill #0
  Filled 2024-05-12: qty 42.5, 90d supply, fill #0

## 2024-03-17 ENCOUNTER — Other Ambulatory Visit: Payer: Self-pay

## 2024-04-01 ENCOUNTER — Other Ambulatory Visit: Payer: Self-pay

## 2024-04-01 MED FILL — Alprazolam Tab 0.25 MG: ORAL | 30 days supply | Qty: 30 | Fill #2 | Status: AC

## 2024-04-02 ENCOUNTER — Other Ambulatory Visit: Payer: Self-pay

## 2024-04-12 ENCOUNTER — Other Ambulatory Visit: Payer: Self-pay

## 2024-04-14 ENCOUNTER — Ambulatory Visit: Admitting: Internal Medicine

## 2024-04-14 ENCOUNTER — Other Ambulatory Visit: Payer: Self-pay

## 2024-04-14 ENCOUNTER — Encounter: Payer: Self-pay | Admitting: Internal Medicine

## 2024-04-14 VITALS — BP 122/72 | HR 69 | Ht 66.5 in | Wt 160.0 lb

## 2024-04-14 DIAGNOSIS — E1121 Type 2 diabetes mellitus with diabetic nephropathy: Secondary | ICD-10-CM | POA: Diagnosis not present

## 2024-04-14 DIAGNOSIS — F4322 Adjustment disorder with anxiety: Secondary | ICD-10-CM | POA: Diagnosis not present

## 2024-04-14 DIAGNOSIS — E1169 Type 2 diabetes mellitus with other specified complication: Secondary | ICD-10-CM | POA: Diagnosis not present

## 2024-04-14 DIAGNOSIS — M489 Spondylopathy, unspecified: Secondary | ICD-10-CM | POA: Diagnosis not present

## 2024-04-14 DIAGNOSIS — R053 Chronic cough: Secondary | ICD-10-CM

## 2024-04-14 DIAGNOSIS — F411 Generalized anxiety disorder: Secondary | ICD-10-CM | POA: Diagnosis not present

## 2024-04-14 DIAGNOSIS — Z7985 Long-term (current) use of injectable non-insulin antidiabetic drugs: Secondary | ICD-10-CM

## 2024-04-14 DIAGNOSIS — E782 Mixed hyperlipidemia: Secondary | ICD-10-CM | POA: Diagnosis not present

## 2024-04-14 DIAGNOSIS — I701 Atherosclerosis of renal artery: Secondary | ICD-10-CM | POA: Diagnosis not present

## 2024-04-14 DIAGNOSIS — Z7984 Long term (current) use of oral hypoglycemic drugs: Secondary | ICD-10-CM

## 2024-04-14 DIAGNOSIS — E041 Nontoxic single thyroid nodule: Secondary | ICD-10-CM

## 2024-04-14 DIAGNOSIS — I10 Essential (primary) hypertension: Secondary | ICD-10-CM | POA: Diagnosis not present

## 2024-04-14 DIAGNOSIS — R748 Abnormal levels of other serum enzymes: Secondary | ICD-10-CM

## 2024-04-14 DIAGNOSIS — I350 Nonrheumatic aortic (valve) stenosis: Secondary | ICD-10-CM

## 2024-04-14 MED ORDER — ONDANSETRON 4 MG PO TBDP
4.0000 mg | ORAL_TABLET | Freq: Three times a day (TID) | ORAL | 2 refills | Status: AC | PRN
  Filled 2024-04-14: qty 30, 10d supply, fill #0

## 2024-04-14 MED ORDER — OZEMPIC (1 MG/DOSE) 4 MG/3ML ~~LOC~~ SOPN
1.0000 mg | PEN_INJECTOR | SUBCUTANEOUS | 2 refills | Status: AC
  Filled 2024-04-14 – 2024-04-28 (×2): qty 9, 84d supply, fill #0

## 2024-04-14 MED ORDER — DULOXETINE HCL 20 MG PO CPEP
40.0000 mg | ORAL_CAPSULE | Freq: Every day | ORAL | 2 refills | Status: AC
  Filled 2024-05-11: qty 180, 90d supply, fill #0

## 2024-04-14 MED ORDER — TELMISARTAN 80 MG PO TABS
80.0000 mg | ORAL_TABLET | Freq: Every day | ORAL | 1 refills | Status: AC
  Filled 2024-04-14 – 2024-04-28 (×2): qty 90, 90d supply, fill #0

## 2024-04-14 MED ORDER — HYDRALAZINE HCL 50 MG PO TABS
50.0000 mg | ORAL_TABLET | Freq: Three times a day (TID) | ORAL | 1 refills | Status: AC
  Filled 2024-04-14: qty 90, 30d supply, fill #0

## 2024-04-14 NOTE — Progress Notes (Unsigned)
 Subjective:  Patient ID: Courtney Lopez, female    DOB: 03/31/1961  Age: 63 y.o. MRN: [id number]  CC: The primary encounter diagnosis was Hyperlipidemia associated with type 2 diabetes mellitus (HCC). Diagnoses of Generalized anxiety disorder, Essential (primary) hypertension, and DM type 2 with diabetic mixed hyperlipidemia (HCC) were also pertinent to this visit.   HPI Myan Locatelli Woodroof presents for  Chief Complaint  Patient presents with   Medical Management of Chronic Issues    3 month follow up   Episodes of cough resulting in emesis occurring weekly .  zofran   Outpatient Medications Prior to Visit  Medication Sig Dispense Refill   acetaminophen  (TYLENOL ) 325 MG tablet Take 2 tablets (650 mg total) by mouth every 6 (six) hours as needed for mild pain.     ALPRAZolam  (XANAX ) 0.25 MG tablet Take 1 tablet (0.25 mg total) by mouth at bedtime as needed. 30 tablet 5   amLODipine  (NORVASC ) 5 MG tablet Take 1 tablet (5 mg total) by mouth daily. 90 tablet 1   aspirin  EC 81 MG tablet Take 1 tablet (81 mg total) by mouth daily. Swallow whole. 30 tablet 12   blood glucose meter kit and supplies KIT Dispense based on patient and insurance preference. Use up to four times daily as directed. 1 each 0   carvedilol  (COREG ) 25 MG tablet Take 1 tablet (25 mg total) by mouth 2 (two) times daily with a meal. 180 tablet 1   DULoxetine  (CYMBALTA ) 20 MG capsule Take 2 capsules (40 mg total) by mouth daily. 60 capsule 2   empagliflozin  (JARDIANCE ) 10 MG TABS tablet Take 1 tablet (10 mg total) by mouth daily before breakfast. 90 tablet 1   estradiol  (ESTRACE ) 0.01 % CREA vaginal cream Place 1 Applicatorful vaginally 3 (three) times a week. FOR 14 DAYS, then twice weekly thereafter 42.5 g 12   fluticasone  (FLONASE ) 50 MCG/ACT nasal spray Place 2 sprays into both nostrils daily. 16 g 0   furosemide  (LASIX ) 20 MG tablet Take 1 tablet (20 mg total) by mouth as needed for weight gain of 2 lbs overnight 30  tablet 3   glucose blood (ONETOUCH ULTRA) test strip Use as instructed 100 each 0   hydrALAZINE  (APRESOLINE ) 50 MG tablet Take 1 tablet (50 mg total) by mouth 3 (three) times daily. 90 tablet 1   HYDROcodone -acetaminophen  (NORCO) 10-325 MG tablet Take 1 tablet by mouth daily as needed. 30 tablet 0   Menthol, Topical Analgesic, (BIOFREEZE EX) Apply 1 application  topically 3 (three) times daily as needed (neck pain).     metFORMIN  (GLUCOPHAGE -XR) 750 MG 24 hr tablet Take 1 tablet (750 mg total) by mouth daily with breakfast. 90 tablet 2   rosuvastatin  (CRESTOR ) 5 MG tablet Take 1 tablet (5 mg total) by mouth at bedtime. 90 tablet 3   Semaglutide , 1 MG/DOSE, (OZEMPIC , 1 MG/DOSE,) 4 MG/3ML SOPN Inject 1 mg into the skin once a week. 3 mL 2   sodium fluoride  (PREVIDENT 5000 PLUS) 1.1 % CREA dental cream Brush teeth thoroughly for 2 minutes, do not rinse afterwards 51 g 12   telmisartan  (MICARDIS ) 80 MG tablet Take 1 tablet (80 mg total) by mouth daily. 90 tablet 1   traMADol  (ULTRAM ) 50 MG tablet Take 2 tablets (100 mg total) by mouth every 6 (six) hours as needed (for pain). 240 tablet 5   amoxicillin  (AMOXIL ) 500 MG capsule Take 2,000 mg by mouth See admin instructions. Take 2,000 mg by mouth one hour  prior to dental procedures (Patient not taking: Reported on 04/14/2024)     No facility-administered medications prior to visit.    Review of Systems;  Patient denies headache, fevers, malaise, unintentional weight loss, skin rash, eye pain, sinus congestion and sinus pain, sore throat, dysphagia,  hemoptysis , cough, dyspnea, wheezing, chest pain, palpitations, orthopnea, edema, abdominal pain, nausea, melena, diarrhea, constipation, flank pain, dysuria, hematuria, urinary  Frequency, nocturia, numbness, tingling, seizures,  Focal weakness, Loss of consciousness,  Tremor, insomnia, depression, anxiety, and suicidal ideation.      Objective:  BP 122/72   Pulse 69   Ht 5' 6.5 (1.689 m)   Wt 160  lb (72.6 kg)   LMP  (LMP Unknown)   SpO2 97%   BMI 25.44 kg/m   BP Readings from Last 3 Encounters:  04/14/24 122/72  01/14/24 (!) 142/87  07/23/23 124/78    Wt Readings from Last 3 Encounters:  04/14/24 160 lb (72.6 kg)  01/14/24 159 lb 12.8 oz (72.5 kg)  07/23/23 157 lb 6.4 oz (71.4 kg)    Physical Exam  Lab Results  Component Value Date   HGBA1C 5.2 12/03/2023   HGBA1C 5.6 07/23/2023   HGBA1C 5.8 04/16/2023    Lab Results  Component Value Date   CREATININE 1.00 01/14/2024   CREATININE 1.02 07/23/2023   CREATININE 0.93 04/16/2023    Lab Results  Component Value Date   WBC 8.9 09/27/2022   HGB 12.7 09/27/2022   HCT 38.4 09/27/2022   PLT 266 09/27/2022   GLUCOSE 92 01/14/2024   CHOL 95 12/03/2023   TRIG 89 12/03/2023   HDL 52 12/03/2023   LDLDIRECT 38.0 07/23/2023   LDLCALC 25 12/03/2023   ALT 20 12/03/2023   AST 28 12/03/2023   NA 139 01/14/2024   K 4.0 01/14/2024   CL 97 01/14/2024   CREATININE 1.00 01/14/2024   BUN 15 01/14/2024   CO2 32 01/14/2024   TSH 3.182 12/03/2023   INR 1.5 (H) 02/14/2022   HGBA1C 5.2 12/03/2023   MICROALBUR 5.6 (H) 01/14/2024    No results found.  Assessment & Plan:  .Hyperlipidemia associated with type 2 diabetes mellitus (HCC)  Generalized anxiety disorder  Essential (primary) hypertension  DM type 2 with diabetic mixed hyperlipidemia (HCC)     I spent 34 minutes on the day of this face to face encounter reviewing patient's  most recent visit with cardiology,  nephrology,  and neurology,  prior relevant surgical and non surgical procedures, recent  labs and imaging studies, counseling on weight management,  reviewing the assessment and plan with patient, and post visit ordering and reviewing of  diagnostics and therapeutics with patient  .   Follow-up: No follow-ups on file.   Verneita LITTIE Kettering, MD

## 2024-04-14 NOTE — Patient Instructions (Signed)
 I AM ORDERING A CHEST X RAY AND NON FASTING LABS FOR YOU TO GET AT [facility] WHEN IT'S CONVENIENT  WE MAY INCREASE THE JARDIANCE  TO 25 MG TO FURTHER REDUCE THE PROTEINURIA DEPENDING ON THE RESULTS OF THIS NEXT TEST   WE CAN INCREASE THE CYMBALTA  TO 60 MG IF YOU FEEL YOUR ANXIETY NEEDING GREATER CONTROL

## 2024-04-15 DIAGNOSIS — R053 Chronic cough: Secondary | ICD-10-CM | POA: Insufficient documentation

## 2024-04-15 NOTE — Assessment & Plan Note (Signed)
 She is tolerating Jardiance  10 mg daily  started in June for increase in UACR from 78 LAST YEAR TO  200.   UaCr has improved from 197 to 63 .  Continue ARB and SGLT 2 inhibitor , increase jardiance  to max dose if repeat Uacr is > 30

## 2024-04-15 NOTE — Assessment & Plan Note (Signed)
 With history of symptomatic pleural effusion  folowing her AVR in Oct 2023.  Chest x ray ordered.  Zofran  for post tussive emesis

## 2024-04-15 NOTE — Assessment & Plan Note (Signed)
 Discussed increasing cymbalta  dose to 60 mg  if episodes of high anxiety continue .  We agreed to continuing rsricted use of  alprazolam  to  once daily

## 2024-04-15 NOTE — Assessment & Plan Note (Signed)
 PATENT LEFT RENAL ARTERY STENT BY APRIL 2025 DOPPLERS.  REPEAT ONE YEAR

## 2024-04-15 NOTE — Assessment & Plan Note (Signed)
 Reviewed most recent ultrasound 2025.  No follow up advised given stability and benign appearance of nodules

## 2024-04-15 NOTE — Assessment & Plan Note (Addendum)
 With chronic pain aggravated by nursing and caregiver activities.  Continue tramadol .  Refill history confirmed via [place] Controlled Substance databas, accessed by me today.[email or web address]

## 2024-04-15 NOTE — Assessment & Plan Note (Signed)
 Ss/p AVR Oct 2023

## 2024-04-15 NOTE — Assessment & Plan Note (Signed)
 Home readings are checked frequently and elevated readings are treated aggressively with amlodipine  and hydralazine  and may be triggered by stress.  No changes today to regimen  of telmisartan , carvedilol  and amlodipine 

## 2024-04-15 NOTE — Assessment & Plan Note (Signed)
 She has lost 11 lbs  using ozempic   and BMI is now. < 26.  Continue current dose. Of ozempic  ,    Metformin .  Will increase dose of  Jardiance   if repeat UaCr is > 30 labs are pending at time of completion  .  Lab Results  Component Value Date   HGBA1C 5.2 12/03/2023

## 2024-04-15 NOTE — Assessment & Plan Note (Signed)
 Likely related to recent surgery.   Resolved with repeat  Lab Results  Component Value Date   ALT 20 12/03/2023   AST 28 12/03/2023   ALKPHOS 90 12/03/2023   BILITOT 0.4 12/03/2023

## 2024-04-28 ENCOUNTER — Other Ambulatory Visit: Payer: Self-pay

## 2024-04-28 MED FILL — Alprazolam Tab 0.25 MG: ORAL | 30 days supply | Qty: 30 | Fill #3 | Status: AC

## 2024-04-29 ENCOUNTER — Other Ambulatory Visit: Payer: Self-pay

## 2024-05-11 ENCOUNTER — Other Ambulatory Visit: Payer: Self-pay | Admitting: Internal Medicine

## 2024-05-12 ENCOUNTER — Other Ambulatory Visit
Admission: RE | Admit: 2024-05-12 | Discharge: 2024-05-12 | Disposition: A | Discharge status: Home / Self Care | Source: Home / Self Care | Attending: Internal Medicine | Admitting: Internal Medicine

## 2024-05-12 ENCOUNTER — Other Ambulatory Visit: Payer: Self-pay | Admitting: Internal Medicine

## 2024-05-12 ENCOUNTER — Ambulatory Visit
Admission: RE | Admit: 2024-05-12 | Discharge: 2024-05-12 | Disposition: A | Discharge status: Home / Self Care | Source: Home / Self Care | Attending: Internal Medicine | Admitting: Internal Medicine

## 2024-05-12 ENCOUNTER — Other Ambulatory Visit: Payer: Self-pay

## 2024-05-12 ENCOUNTER — Ambulatory Visit
Admission: RE | Admit: 2024-05-12 | Discharge: 2024-05-12 | Disposition: A | Discharge status: Home / Self Care | Source: Ambulatory Visit | Attending: Internal Medicine | Admitting: Internal Medicine

## 2024-05-12 DIAGNOSIS — E1121 Type 2 diabetes mellitus with diabetic nephropathy: Secondary | ICD-10-CM | POA: Insufficient documentation

## 2024-05-12 DIAGNOSIS — R053 Chronic cough: Secondary | ICD-10-CM

## 2024-05-12 DIAGNOSIS — E785 Hyperlipidemia, unspecified: Secondary | ICD-10-CM | POA: Diagnosis present

## 2024-05-12 DIAGNOSIS — E1169 Type 2 diabetes mellitus with other specified complication: Secondary | ICD-10-CM | POA: Diagnosis present

## 2024-05-12 DIAGNOSIS — N3000 Acute cystitis without hematuria: Secondary | ICD-10-CM | POA: Diagnosis present

## 2024-05-12 DIAGNOSIS — J069 Acute upper respiratory infection, unspecified: Secondary | ICD-10-CM

## 2024-05-12 LAB — COMPREHENSIVE METABOLIC PANEL WITH GFR
ALT: 16 U/L (ref 0–44)
AST: 28 U/L (ref 15–41)
Albumin: 4.6 g/dL (ref 3.5–5.0)
Alkaline Phosphatase: 88 U/L (ref 38–126)
Anion gap: 9 (ref 5–15)
BUN: 12 mg/dL (ref 8–23)
CO2: 29 mmol/L (ref 22–32)
Calcium: 9.6 mg/dL (ref 8.9–10.3)
Chloride: 99 mmol/L (ref 98–111)
Creatinine, Ser: 1.03 mg/dL — ABNORMAL HIGH (ref 0.44–1.00)
GFR, Estimated: >60 mL/min
Glucose, Bld: 89 mg/dL (ref 70–99)
Potassium: 4.1 mmol/L (ref 3.5–5.1)
Sodium: 138 mmol/L (ref 135–145)
Total Bilirubin: 0.6 mg/dL (ref 0.0–1.2)
Total Protein: 7.4 g/dL (ref 6.5–8.1)

## 2024-05-12 LAB — HEMOGLOBIN A1C
Hgb A1c MFr Bld: 5.5 % (ref 4.8–5.6)
Mean Plasma Glucose: 111.15 mg/dL

## 2024-05-12 LAB — URINALYSIS, ROUTINE W REFLEX MICROSCOPIC
Bilirubin Urine: NEGATIVE
Glucose, UA: >=500 mg/dL — AB
Hgb urine dipstick: NEGATIVE
Ketones, ur: NEGATIVE mg/dL
Leukocytes,Ua: NEGATIVE
Nitrite: NEGATIVE
Protein, ur: NEGATIVE mg/dL
Specific Gravity, Urine: 1.022 (ref 1.005–1.030)
pH: 6 (ref 5.0–8.0)

## 2024-05-12 LAB — LIPID PANEL
Cholesterol: 130 mg/dL (ref 0–200)
HDL: 68 mg/dL
LDL Cholesterol: 51 mg/dL (ref 0–99)
Total CHOL/HDL Ratio: 1.9 ratio
Triglycerides: 53 mg/dL
VLDL: 11 mg/dL (ref 0–40)

## 2024-05-12 MED FILL — Fluticasone Propionate Nasal Susp 50 MCG/ACT: NASAL | 30 days supply | Qty: 16 | Fill #0 | Status: AC

## 2024-05-12 MED FILL — Amlodipine Besylate Tab 5 MG (Base Equivalent): ORAL | 90 days supply | Qty: 90 | Fill #0 | Status: AC

## 2024-05-12 NOTE — Addendum Note (Signed)
 Addended by: ALEX JON BROCKS on: 05/12/2024 09:35 AM   Modules accepted: Orders

## 2024-05-13 ENCOUNTER — Ambulatory Visit: Payer: Self-pay | Admitting: Internal Medicine

## 2024-05-13 LAB — MICROALBUMIN / CREATININE URINE RATIO
Creatinine, Urine: 90.1 mg/dL
Microalb Creat Ratio: 30 mg/g{creat} — ABNORMAL HIGH (ref 0–29)
Microalb, Ur: 26.6 ug/mL — ABNORMAL HIGH

## 2024-05-14 ENCOUNTER — Other Ambulatory Visit: Payer: Self-pay

## 2024-05-23 ENCOUNTER — Other Ambulatory Visit: Payer: Self-pay | Admitting: Internal Medicine

## 2024-05-24 ENCOUNTER — Other Ambulatory Visit: Payer: Self-pay

## 2024-05-24 MED ORDER — EMPAGLIFLOZIN 10 MG PO TABS
10.0000 mg | ORAL_TABLET | Freq: Every day | ORAL | 1 refills | Status: AC
  Filled 2024-05-24 – 2024-06-01 (×4): qty 90, 90d supply, fill #0

## 2024-05-25 ENCOUNTER — Other Ambulatory Visit: Payer: Self-pay

## 2024-05-26 ENCOUNTER — Ambulatory Visit: Admitting: Cardiovascular Disease

## 2024-05-28 ENCOUNTER — Other Ambulatory Visit: Payer: Self-pay

## 2024-05-28 MED FILL — Alprazolam Tab 0.25 MG: ORAL | 30 days supply | Qty: 30 | Fill #4 | Status: AC

## 2024-06-01 ENCOUNTER — Other Ambulatory Visit (HOSPITAL_COMMUNITY): Payer: Self-pay

## 2024-06-01 ENCOUNTER — Other Ambulatory Visit: Payer: Self-pay

## 2024-06-10 ENCOUNTER — Other Ambulatory Visit: Payer: Self-pay

## 2024-06-15 ENCOUNTER — Other Ambulatory Visit (HOSPITAL_COMMUNITY): Payer: Self-pay

## 2024-06-15 ENCOUNTER — Other Ambulatory Visit: Payer: Self-pay

## 2024-07-21 ENCOUNTER — Ambulatory Visit: Admitting: Cardiovascular Disease

## 2024-10-13 ENCOUNTER — Ambulatory Visit: Admitting: Internal Medicine
